# Patient Record
Sex: Female | Born: 1975 | Race: Black or African American | Hispanic: No | Marital: Single | Smoking: Current every day smoker
Health system: Southern US, Community
[De-identification: ages and names within clinical notes are randomized; demographics above are authoritative.]

## PROBLEM LIST (undated history)

## (undated) DIAGNOSIS — K219 Gastro-esophageal reflux disease without esophagitis: Secondary | ICD-10-CM

## (undated) DIAGNOSIS — N289 Disorder of kidney and ureter, unspecified: Secondary | ICD-10-CM

## (undated) DIAGNOSIS — D649 Anemia, unspecified: Secondary | ICD-10-CM

## (undated) DIAGNOSIS — F1021 Alcohol dependence, in remission: Secondary | ICD-10-CM

## (undated) DIAGNOSIS — IMO0002 Reserved for concepts with insufficient information to code with codable children: Secondary | ICD-10-CM

## (undated) HISTORY — PX: TUMOR REMOVAL: SHX12

## (undated) HISTORY — PX: ROUX-EN-Y PROCEDURE: SUR1287

## (undated) HISTORY — PX: WRIST GANGLION EXCISION: SUR520

## (undated) HISTORY — PX: FOOT SURGERY: SHX648

## (undated) HISTORY — PX: ABDOMINAL SURGERY: SHX537

## (undated) HISTORY — PX: HERNIA REPAIR: SHX51

## (undated) HISTORY — DX: Anemia, unspecified: D64.9

## (undated) HISTORY — PX: COSMETIC SURGERY: SHX468

---

## 1998-05-16 ENCOUNTER — Emergency Department (HOSPITAL_COMMUNITY): Admission: EM | Admit: 1998-05-16 | Discharge: 1998-05-16 | Payer: Self-pay | Admitting: Emergency Medicine

## 1998-08-19 ENCOUNTER — Emergency Department (HOSPITAL_COMMUNITY): Admission: EM | Admit: 1998-08-19 | Discharge: 1998-08-19 | Payer: Self-pay | Admitting: Emergency Medicine

## 1998-12-18 ENCOUNTER — Other Ambulatory Visit: Admission: RE | Admit: 1998-12-18 | Discharge: 1998-12-18 | Payer: Self-pay | Admitting: Specialist

## 1999-04-11 ENCOUNTER — Encounter: Payer: Self-pay | Admitting: Emergency Medicine

## 1999-04-11 ENCOUNTER — Emergency Department (HOSPITAL_COMMUNITY): Admission: EM | Admit: 1999-04-11 | Discharge: 1999-04-11 | Payer: Self-pay | Admitting: Emergency Medicine

## 1999-07-14 ENCOUNTER — Other Ambulatory Visit: Admission: RE | Admit: 1999-07-14 | Discharge: 1999-07-14 | Payer: Self-pay | Admitting: Obstetrics and Gynecology

## 1999-07-22 ENCOUNTER — Encounter: Payer: Self-pay | Admitting: Obstetrics and Gynecology

## 1999-07-22 ENCOUNTER — Ambulatory Visit (HOSPITAL_COMMUNITY): Admission: RE | Admit: 1999-07-22 | Discharge: 1999-07-22 | Payer: Self-pay | Admitting: Obstetrics and Gynecology

## 1999-08-05 ENCOUNTER — Encounter: Payer: Self-pay | Admitting: Emergency Medicine

## 1999-08-05 ENCOUNTER — Emergency Department (HOSPITAL_COMMUNITY): Admission: EM | Admit: 1999-08-05 | Discharge: 1999-08-05 | Payer: Self-pay | Admitting: Emergency Medicine

## 1999-08-10 ENCOUNTER — Encounter: Payer: Self-pay | Admitting: Internal Medicine

## 1999-08-10 ENCOUNTER — Encounter: Admission: RE | Admit: 1999-08-10 | Discharge: 1999-08-10 | Payer: Self-pay | Admitting: Internal Medicine

## 1999-08-11 ENCOUNTER — Encounter: Payer: Self-pay | Admitting: Internal Medicine

## 1999-08-17 ENCOUNTER — Ambulatory Visit (HOSPITAL_COMMUNITY): Admission: RE | Admit: 1999-08-17 | Discharge: 1999-08-17 | Payer: Self-pay | Admitting: Obstetrics and Gynecology

## 1999-08-17 ENCOUNTER — Encounter (INDEPENDENT_AMBULATORY_CARE_PROVIDER_SITE_OTHER): Payer: Self-pay

## 2000-01-13 ENCOUNTER — Emergency Department (HOSPITAL_COMMUNITY): Admission: EM | Admit: 2000-01-13 | Discharge: 2000-01-13 | Payer: Self-pay

## 2000-04-07 ENCOUNTER — Inpatient Hospital Stay (HOSPITAL_COMMUNITY): Admission: AD | Admit: 2000-04-07 | Discharge: 2000-04-07 | Payer: Self-pay | Admitting: Obstetrics and Gynecology

## 2000-04-20 ENCOUNTER — Ambulatory Visit (HOSPITAL_COMMUNITY): Admission: RE | Admit: 2000-04-20 | Discharge: 2000-04-20 | Payer: Self-pay | Admitting: Obstetrics and Gynecology

## 2000-04-20 ENCOUNTER — Encounter: Payer: Self-pay | Admitting: Obstetrics and Gynecology

## 2000-04-24 ENCOUNTER — Ambulatory Visit: Admission: RE | Admit: 2000-04-24 | Discharge: 2000-04-24 | Payer: Self-pay | Admitting: *Deleted

## 2000-04-25 ENCOUNTER — Encounter: Payer: Self-pay | Admitting: *Deleted

## 2000-04-25 ENCOUNTER — Ambulatory Visit (HOSPITAL_COMMUNITY): Admission: RE | Admit: 2000-04-25 | Discharge: 2000-04-25 | Payer: Self-pay | Admitting: *Deleted

## 2000-05-20 ENCOUNTER — Emergency Department (HOSPITAL_COMMUNITY): Admission: EM | Admit: 2000-05-20 | Discharge: 2000-05-20 | Payer: Self-pay | Admitting: Internal Medicine

## 2000-06-12 ENCOUNTER — Ambulatory Visit (HOSPITAL_COMMUNITY): Admission: RE | Admit: 2000-06-12 | Discharge: 2000-06-12 | Payer: Self-pay | Admitting: *Deleted

## 2000-06-12 ENCOUNTER — Encounter: Payer: Self-pay | Admitting: Obstetrics and Gynecology

## 2000-06-18 ENCOUNTER — Ambulatory Visit (HOSPITAL_COMMUNITY): Admission: RE | Admit: 2000-06-18 | Discharge: 2000-06-18 | Payer: Self-pay | Admitting: Obstetrics and Gynecology

## 2000-06-25 ENCOUNTER — Ambulatory Visit (HOSPITAL_COMMUNITY): Admission: RE | Admit: 2000-06-25 | Discharge: 2000-06-25 | Payer: Self-pay | Admitting: Obstetrics and Gynecology

## 2000-09-21 ENCOUNTER — Encounter (INDEPENDENT_AMBULATORY_CARE_PROVIDER_SITE_OTHER): Payer: Self-pay

## 2000-09-21 ENCOUNTER — Inpatient Hospital Stay (HOSPITAL_COMMUNITY): Admission: AD | Admit: 2000-09-21 | Discharge: 2000-09-27 | Payer: Self-pay | Admitting: Obstetrics and Gynecology

## 2000-11-08 ENCOUNTER — Other Ambulatory Visit: Admission: RE | Admit: 2000-11-08 | Discharge: 2000-11-08 | Payer: Self-pay | Admitting: Obstetrics and Gynecology

## 2001-01-15 ENCOUNTER — Emergency Department (HOSPITAL_COMMUNITY): Admission: EM | Admit: 2001-01-15 | Discharge: 2001-01-16 | Payer: Self-pay | Admitting: Emergency Medicine

## 2001-06-24 ENCOUNTER — Emergency Department (HOSPITAL_COMMUNITY): Admission: EM | Admit: 2001-06-24 | Discharge: 2001-06-25 | Payer: Self-pay | Admitting: Emergency Medicine

## 2002-06-14 ENCOUNTER — Emergency Department (HOSPITAL_COMMUNITY): Admission: EM | Admit: 2002-06-14 | Discharge: 2002-06-14 | Payer: Self-pay

## 2002-07-22 ENCOUNTER — Emergency Department (HOSPITAL_COMMUNITY): Admission: EM | Admit: 2002-07-22 | Discharge: 2002-07-22 | Payer: Self-pay | Admitting: Emergency Medicine

## 2002-10-01 ENCOUNTER — Encounter: Admission: RE | Admit: 2002-10-01 | Discharge: 2002-10-01 | Payer: Self-pay | Admitting: Internal Medicine

## 2002-10-01 ENCOUNTER — Encounter: Payer: Self-pay | Admitting: Internal Medicine

## 2004-01-02 ENCOUNTER — Emergency Department (HOSPITAL_COMMUNITY): Admission: EM | Admit: 2004-01-02 | Discharge: 2004-01-02 | Payer: Self-pay | Admitting: Emergency Medicine

## 2004-01-18 ENCOUNTER — Emergency Department (HOSPITAL_COMMUNITY): Admission: EM | Admit: 2004-01-18 | Discharge: 2004-01-18 | Payer: Self-pay | Admitting: Family Medicine

## 2004-01-24 ENCOUNTER — Emergency Department (HOSPITAL_COMMUNITY): Admission: EM | Admit: 2004-01-24 | Discharge: 2004-01-24 | Payer: Self-pay | Admitting: *Deleted

## 2004-01-26 ENCOUNTER — Encounter: Admission: RE | Admit: 2004-01-26 | Discharge: 2004-01-26 | Payer: Self-pay | Admitting: Internal Medicine

## 2004-02-17 ENCOUNTER — Encounter: Admission: RE | Admit: 2004-02-17 | Discharge: 2004-05-17 | Payer: Self-pay | Admitting: Sports Medicine

## 2004-04-18 ENCOUNTER — Ambulatory Visit (HOSPITAL_COMMUNITY): Admission: RE | Admit: 2004-04-18 | Discharge: 2004-04-18 | Payer: Self-pay | Admitting: Surgery

## 2004-04-18 ENCOUNTER — Encounter (INDEPENDENT_AMBULATORY_CARE_PROVIDER_SITE_OTHER): Payer: Self-pay | Admitting: Specialist

## 2004-12-03 ENCOUNTER — Ambulatory Visit (HOSPITAL_COMMUNITY): Admission: RE | Admit: 2004-12-03 | Discharge: 2004-12-03 | Payer: Self-pay | Admitting: General Surgery

## 2004-12-03 ENCOUNTER — Encounter (INDEPENDENT_AMBULATORY_CARE_PROVIDER_SITE_OTHER): Payer: Self-pay | Admitting: Specialist

## 2005-08-30 ENCOUNTER — Encounter: Admission: RE | Admit: 2005-08-30 | Discharge: 2005-08-30 | Payer: Self-pay | Admitting: Internal Medicine

## 2005-09-15 ENCOUNTER — Ambulatory Visit (HOSPITAL_COMMUNITY): Admission: AD | Admit: 2005-09-15 | Discharge: 2005-09-15 | Payer: Self-pay

## 2005-10-12 ENCOUNTER — Encounter (INDEPENDENT_AMBULATORY_CARE_PROVIDER_SITE_OTHER): Payer: Self-pay | Admitting: *Deleted

## 2005-10-12 ENCOUNTER — Ambulatory Visit (HOSPITAL_COMMUNITY): Admission: RE | Admit: 2005-10-12 | Discharge: 2005-10-12 | Payer: Self-pay | Admitting: General Surgery

## 2005-11-23 ENCOUNTER — Encounter (INDEPENDENT_AMBULATORY_CARE_PROVIDER_SITE_OTHER): Payer: Self-pay | Admitting: Specialist

## 2005-11-23 ENCOUNTER — Ambulatory Visit (HOSPITAL_COMMUNITY): Admission: RE | Admit: 2005-11-23 | Discharge: 2005-11-23 | Payer: Self-pay | Admitting: General Surgery

## 2009-10-06 ENCOUNTER — Emergency Department (HOSPITAL_COMMUNITY): Admission: EM | Admit: 2009-10-06 | Discharge: 2009-10-07 | Payer: Self-pay | Admitting: Emergency Medicine

## 2009-11-23 ENCOUNTER — Emergency Department (HOSPITAL_BASED_OUTPATIENT_CLINIC_OR_DEPARTMENT_OTHER): Admission: EM | Admit: 2009-11-23 | Discharge: 2009-11-23 | Payer: Self-pay | Admitting: Emergency Medicine

## 2010-04-03 ENCOUNTER — Emergency Department (HOSPITAL_COMMUNITY): Admission: EM | Admit: 2010-04-03 | Discharge: 2010-04-03 | Payer: Self-pay | Admitting: Emergency Medicine

## 2010-04-07 ENCOUNTER — Ambulatory Visit: Payer: Self-pay | Admitting: Internal Medicine

## 2010-09-03 ENCOUNTER — Emergency Department (HOSPITAL_BASED_OUTPATIENT_CLINIC_OR_DEPARTMENT_OTHER)
Admission: EM | Admit: 2010-09-03 | Discharge: 2010-09-03 | Payer: Self-pay | Source: Home / Self Care | Admitting: Emergency Medicine

## 2010-09-04 ENCOUNTER — Emergency Department (HOSPITAL_COMMUNITY)
Admission: EM | Admit: 2010-09-04 | Discharge: 2010-09-05 | Payer: Self-pay | Source: Home / Self Care | Admitting: Emergency Medicine

## 2010-09-13 ENCOUNTER — Emergency Department (HOSPITAL_BASED_OUTPATIENT_CLINIC_OR_DEPARTMENT_OTHER)
Admission: EM | Admit: 2010-09-13 | Discharge: 2010-09-13 | Payer: Self-pay | Source: Home / Self Care | Admitting: Emergency Medicine

## 2010-10-18 ENCOUNTER — Ambulatory Visit
Admission: RE | Admit: 2010-10-18 | Discharge: 2010-10-18 | Payer: Self-pay | Source: Home / Self Care | Attending: Internal Medicine | Admitting: Internal Medicine

## 2010-10-18 ENCOUNTER — Encounter
Admission: RE | Admit: 2010-10-18 | Discharge: 2010-10-18 | Payer: Self-pay | Source: Home / Self Care | Attending: Internal Medicine | Admitting: Internal Medicine

## 2010-10-18 ENCOUNTER — Inpatient Hospital Stay (HOSPITAL_COMMUNITY)
Admission: EM | Admit: 2010-10-18 | Discharge: 2010-10-20 | Payer: Self-pay | Attending: Internal Medicine | Admitting: Internal Medicine

## 2010-10-19 LAB — DIFFERENTIAL
Basophils Absolute: 0 10*3/uL (ref 0.0–0.1)
Basophils Relative: 0 % (ref 0–1)
Eosinophils Absolute: 0.1 10*3/uL (ref 0.0–0.7)
Eosinophils Relative: 2 % (ref 0–5)
Monocytes Absolute: 0.5 10*3/uL (ref 0.1–1.0)
Monocytes Relative: 10 % (ref 3–12)
Neutrophils Relative %: 60 % (ref 43–77)

## 2010-10-19 LAB — BASIC METABOLIC PANEL
BUN: 9 mg/dL (ref 6–23)
CO2: 26 mEq/L (ref 19–32)
Creatinine, Ser: 0.59 mg/dL (ref 0.4–1.2)
Glucose, Bld: 82 mg/dL (ref 70–99)
Potassium: 3.1 mEq/L — ABNORMAL LOW (ref 3.5–5.1)
Sodium: 142 mEq/L (ref 135–145)

## 2010-10-19 LAB — LIPASE, BLOOD: Lipase: 20 U/L (ref 11–59)

## 2010-10-19 LAB — CBC
RBC: 3.2 MIL/uL — ABNORMAL LOW (ref 3.87–5.11)
WBC: 4.9 10*3/uL (ref 4.0–10.5)

## 2010-10-19 LAB — HEPATIC FUNCTION PANEL
ALT: 11 U/L (ref 0–35)
AST: 12 U/L (ref 0–37)
Alkaline Phosphatase: 63 U/L (ref 39–117)
Bilirubin, Direct: 0.2 mg/dL (ref 0.0–0.3)
Total Bilirubin: 1 mg/dL (ref 0.3–1.2)
Total Protein: 5.7 g/dL — ABNORMAL LOW (ref 6.0–8.3)

## 2010-10-20 ENCOUNTER — Ambulatory Visit
Admission: RE | Admit: 2010-10-20 | Discharge: 2010-10-20 | Payer: Self-pay | Source: Home / Self Care | Attending: Internal Medicine | Admitting: Internal Medicine

## 2010-10-20 LAB — URINALYSIS, ROUTINE W REFLEX MICROSCOPIC
Bilirubin Urine: NEGATIVE
Ketones, ur: NEGATIVE mg/dL
Protein, ur: NEGATIVE mg/dL
Specific Gravity, Urine: 1.012 (ref 1.005–1.030)
Urobilinogen, UA: 1 mg/dL (ref 0.0–1.0)

## 2010-10-20 LAB — CBC
Hemoglobin: 9.6 g/dL — ABNORMAL LOW (ref 12.0–15.0)
MCH: 31.1 pg (ref 26.0–34.0)
MCV: 90 fL (ref 78.0–100.0)
Platelets: 179 10*3/uL (ref 150–400)
WBC: 4.7 10*3/uL (ref 4.0–10.5)

## 2010-10-20 LAB — DIFFERENTIAL
Basophils Absolute: 0 10*3/uL (ref 0.0–0.1)
Basophils Relative: 0 % (ref 0–1)
Eosinophils Absolute: 0.2 10*3/uL (ref 0.0–0.7)
Eosinophils Relative: 3 % (ref 0–5)
Lymphs Abs: 1.6 10*3/uL (ref 0.7–4.0)
Neutrophils Relative %: 52 % (ref 43–77)

## 2010-10-20 LAB — BASIC METABOLIC PANEL
Calcium: 8.3 mg/dL — ABNORMAL LOW (ref 8.4–10.5)
Chloride: 108 mEq/L (ref 96–112)
Creatinine, Ser: 0.56 mg/dL (ref 0.4–1.2)
GFR calc Af Amer: >60 mL/min (ref >60)
Glucose, Bld: 90 mg/dL (ref 70–99)
Potassium: 2.9 mEq/L — ABNORMAL LOW (ref 3.5–5.1)

## 2010-10-21 LAB — URINE CULTURE
Culture  Setup Time: 201201260531
Culture: NO GROWTH
Special Requests: NEGATIVE

## 2010-10-24 ENCOUNTER — Encounter: Payer: Self-pay | Admitting: Gastroenterology

## 2010-10-26 NOTE — Discharge Summary (Signed)
NAMEBRECKEN, [name]              ACCOUNT NO.:  [id number]  MEDICAL RECORD NO.:  [id number]          PATIENT TYPE:  INP  LOCATION:  1522                         FACILITY:  [facility]  PHYSICIAN:  Luanna Cole. Lenord Fellers, M.D.   DATE OF BIRTH:  23-Jan-1976  DATE OF ADMISSION:  10/18/2010 DATE OF DISCHARGE:  10/20/2010                              DISCHARGE SUMMARY   FINAL DIAGNOSES: 1. 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 2. Ulcerations detected on jejunal side of the pouch by endoscopy at     [facility] on September 16, 2010. 3. Gastroesophageal reflux. 4. History of smoking. 5. Status post gastric bypass surgery in December 2010 [facility]     [facility]. 6. Hypokalemia.  CONDITION AT TIME OF DISCHARGE:  Stable.  The patient is being transferred to [facility] for further evaluation and possible surgery.  DISCHARGE MEDICATIONS: 1. Flagyl 500 mg IV every 8 hours. 2. Cipro 400 mg IV every 12 hours. 3. Protonix 40 mg IV every 12 hours. 4. Morphine sulfate 1 mg IV every 4 hours p.r.n. pain. 5. Carafate slurry 1 gram p.o. t.i.d. 6. Vitamin B12 daily.  The patient is noted at the time of discharge to have potassium of 2.9, on IV fluids D5 1/2 normal saline with 20 mEq of potassium chloride per liter at 125 mL per hour.  She is on a clear liquid diet.  IV fluids are being changed prior to transfer to D5 1/2 normal saline with 40 mEq KCl per liter at 125 mL per hour.  She is likely to need additional potassium supplementation upon arrival to [facility] via runs of potassium chloride to normalize her potassium.  BRIEF HISTORY:  This 35 year old black female underwent gastric bypass surgery at [facility] in December 2010 and has done well, losing about 163 pounds over the past year.  Her weight in 2006 was 333  pounds in my office.  She presented to the office acutely complaining of severe abdominal pain and had onset on Sunday, January 22 after eating a small amount of beef hot dog.  She has had abdominal pain throughout December and had endoscopy by Dr. Barnetta Chapel on September 17, 2011, at [facility] showing some alterations on the jejunal side of the pouch and was started on Carafate t.i.d. and Prilosec.  The patient said she did not get relief with these medications but was not taking Carafate on an empty stomach always.  She does smoke mostly on weekends about half a pack over 2 days.  She had tried Tums p.o. and Gas-X for pain relief, but of course these did not work.  Spoke with Dr. Barnetta Chapel by telephone.  He suggested we try IV fluid hydration as the patient was felt to be volume depleted with some small amount of vomiting and they are to before admission.  Her urine was dark.  Specific gravity in office was 1.015.  Specific gravity at time of discharge 1.012, negative for blood, LE,  and nitrite.  No bilirubin noted in urine.  Liver functions are normal.  White blood cell count at the time of discharge is 40,700 and on admission was 55,100.  The patient had maximal temperature spike of 100.1 degrees at 2:00 a.m. on January 25 and now temperature is 99 degrees orally.  Blood pressure is stable at 116/71, pulse 70 and regular.  HOSPITAL COURSE:  The patient was given morphine sulfate 1 mg IV every 4 hours p.r.n. abdominal pain and did well with that.  She was started on Carafate slurry 1 gram t.i.d., was given Protonix 40 mg IV daily initially but that was increased to every 12 hours after 24 hours.  She had a CT scan of her abdomen and a GI consultation by Dr. Wendall Papa. CT scan showed no tissue density in the left abdomen, possibly an area of fat necrosis.  There was some peritoneal fluid present on CT of the abdomen and some periportal edema.  Dr. Christella Hartigan showed this CT  scan to a bariatric surgeon and he felt it was possibly consistent with an internal hernia, a rare complication related to gastric bypass surgery. Dr. Christella Hartigan called bariatric surgeon at [facility] and he agreed to take the patient in transfer for evaluation and possible surgery.  The patient is being discharged in stable condition and stable for transport.     Luanna Cole. Lenord Fellers, M.D.     MJB/MEDQ  D:  10/20/2010  T:  10/20/2010  Job:  [id number]  cc:   Rachael Fee, MD [address] [place], [place] [address]  Electronically Signed by Marlan Palau M.D. on 10/26/2010 05:34:21 PM

## 2010-10-26 NOTE — H&P (Signed)
[name], [name]              ACCOUNT NO.:  [id number]  MEDICAL RECORD NO.:  [id number]          PATIENT TYPE:  INP  LOCATION:  1522                         FACILITY:  [facility]  PHYSICIAN:  [name]. Lori Fellers, M.D.   DATE OF BIRTH:  1976/01/16  DATE OF ADMISSION:  10/18/2010 DATE OF DISCHARGE:                             HISTORY & PHYSICAL   HISTORY OF PRESENT ILLNESS:  This is a 35 year old black female status post gastric bypass surgery at [name] in December 2010, has done very well up until December 2011.  She developed abdominal pain and said she went to the emergency department here on December 10th, 12th and 20th.  She has had abdominal ultrasound, KUB and abdominal CT.  Abdominal CT suggested sludge in the gallbladder with abdominal ultrasound was negative.  She subsequently went to see [name], surgeon at [facility] who performed her surgery.  He did an endoscopy on September 17, 2011 showing multiple ulcerations on the jejunal side of the pouch, which were treated with Carafate t.i.d. and Prilosec.  The patient says she has got no relief from these upon further questioning.  She is smoking about half a pack over 2 days on the weekends.  She ate part of a beef hot dog on Saturday, January 21 and became ill on Sunday, January 22.  Says she has been watching her weight fairly carefully and has lost 163 pounds over the past year.  She says she did eat some jalapeno peppers at one point in December and that caused one of her ER visits.  She says that over the past 2 days, she has had chills, no documented fever, complained of nausea.  She tried Tums and Gas-X without relief.  Says she has had a small amount of vomiting.  Decreased oral intake.  I was unaware if she had all of these problems until she came to the office today.  White blood cell count 5500, hemoglobin 11.7 g, sodium 146.  Liver functions within normal limits.   Urinalysis showed urobilinogen, specific gravity 1.015.  Spoke with [name] and he indicated 15% of gastric bypass patients would develop these types of ulcerations and pain and it would take a little bit of time to work through it.  He indicated sometimes patients had to have dilatation if a stricture developed but he was certain she had no stricture when he endoscoped her on December 23.  He also indicated that sometimes these patients had to undergo a vagotomy. She is admitted now for IV fluid hydration, IV PPI medication, bowel rest and p.o. Carafate.  Of note, she has been taking the Carafate, not necessarily on an empty stomach and also with some medication.  I doubt it was really working for her under those circumstances.  ALLERGIES:  No known drug allergies.  PAST MEDICAL HISTORY: 1. Longstanding history of morbid obesity prior to gastric bypass     surgery in December 2010. 2. History of GE reflux. 3. She had a D and C in November 2000. 4. The gastric bypass surgery that she underwent was described as  a     Roux-en-Y laparoscopic endoscopy. 5. She has had multiple abscesses drained from axillae in the past.     In July 2005, Dr. Ezzard Chambers drained an abscess from her left axilla     with a residual cyst.  Apparently prior to that operation, she had     been in the office requiring drainage at least twice.  In March     2006, Dr. Maryagnes Chambers did an I and D for right axillary abscess and     excision of right axillary and tail of breast inflammatory tissue.     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  This has not been a recurrent problem.  In December     2006, Dr. Orson Chambers did an I and D abscess drainage of the right     axilla.  SOCIAL HISTORY:  The patient has 1 child.  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 She also is attending college part time.  Her weight was noted to be 333 pounds in 2006.  I saw her in July 2011 and she weighed 233 pounds.  Today, she weighs 182 pounds.  She does smoke some, alcohol consumption is rare, never married.  Her baby's father is supportive.  History of mild vitamin D deficiency in July 2011.  FAMILY HISTORY:  Mother with history of hyperlipidemia and hypertension. Father deceased with history of diabetes and CVA.  She has several siblings.  Some are overweight.  One sister died of pancreatic tumor or some type of GI malignancy, it is not clear to me.  Mother was recently diagnosed with a pulmonary embolus.  One brother in good health.  OBJECTIVE:  SKIN:  Warm and dry.  Nodes none. HEENT EXAM:  TMs and pharynx are clear.  Sclerae are clear. NECK:  Supple.  No JVD, thyromegaly or carotid bruits. CHEST:  Clear. CARDIAC EXAM:  Regular rate and rhythm. ABDOMEN:  Bowel sounds decreased.  No distention.  No hepatosplenomegaly or masses but exquisitely tender in the epigastric area and generally in the abdomen plus/minus rebound tenderness present.  She was sent to [facility] imaging for KUB, flat and upright, which revealed minimal ileus of proximal bowel.  Spoke with [name] by phone regarding her situation, he agreed.  We will admit her for IV fluid hydration, pain management and aggressive treatment of ulcerations that were detected at the end of December.  GI consult will be obtained.  IMPRESSION: 1. Ileus, proximal small bowel. 2. Status post gastric bypass surgery, December 2010 with good weight     loss, now with significant abdominal pain, etiology unclear despite     several imaging studies and trips to the emergency department since     early December 2011.  Recent diagnosis of ulcerations on jejunal     side of pouch. 3. Cigarette abuse. 4. Remote history of GE reflux.  PLAN:  The patient will be admitted for aggressive IV  fluid hydration. We will give small amounts of morphine sulfate IV.  Start IV proton pump inhibitor.  GI consultations.     [name]. Lori Fellers, M.D.     MJB/MEDQ  D:  10/19/2010  T:  10/19/2010  Job:  [id number]  cc:   Rachael Fee, MD [address]  [place], [place] [address]  Electronically Signed by Marlan Palau M.D. on 10/26/2010 05:34:16 PM

## 2010-11-08 ENCOUNTER — Ambulatory Visit: Payer: Self-pay | Admitting: Internal Medicine

## 2010-11-08 DIAGNOSIS — K279 Peptic ulcer, site unspecified, unspecified as acute or chronic, without hemorrhage or perforation: Secondary | ICD-10-CM

## 2010-11-10 NOTE — Letter (Signed)
Summary: [facility]   Imported By: Sherian Rein 11/04/2010 12:30:40  _____________________________________________________________________  External Attachment:    Type:   Image     Comment:   External Document

## 2010-11-11 ENCOUNTER — Other Ambulatory Visit: Payer: Self-pay | Admitting: Internal Medicine

## 2010-12-05 LAB — CBC
HCT: 37.6 % (ref 36.0–46.0)
Hemoglobin: 13.1 g/dL (ref 12.0–15.0)
MCH: 30.3 pg (ref 26.0–34.0)
MCH: 31.7 pg (ref 26.0–34.0)
MCHC: 34.2 g/dL (ref 30.0–36.0)
Platelets: 294 10*3/uL (ref 150–400)
RBC: 3.63 MIL/uL — ABNORMAL LOW (ref 3.87–5.11)
RDW: 14.1 % (ref 11.5–15.5)
WBC: 6.1 10*3/uL (ref 4.0–10.5)

## 2010-12-05 LAB — URINALYSIS, ROUTINE W REFLEX MICROSCOPIC
Glucose, UA: NEGATIVE mg/dL
Leukocytes, UA: NEGATIVE
Specific Gravity, Urine: 1.018 (ref 1.005–1.030)
pH: 5.5 (ref 5.0–8.0)

## 2010-12-05 LAB — COMPREHENSIVE METABOLIC PANEL
ALT: 29 U/L (ref 0–35)
ALT: 20 U/L (ref 0–35)
AST: 16 U/L (ref 0–37)
Albumin: 4.7 g/dL (ref 3.5–5.2)
GFR calc Af Amer: >60 mL/min (ref >60)
GFR calc Af Amer: >60 mL/min (ref >60)
GFR calc non Af Amer: >60 mL/min (ref >60)
Glucose, Bld: 86 mg/dL (ref 70–99)
Glucose, Bld: 83 mg/dL (ref 70–99)
Potassium: 3.3 mEq/L — ABNORMAL LOW (ref 3.5–5.1)
Sodium: 136 mEq/L (ref 135–145)
Total Bilirubin: 0.6 mg/dL (ref 0.3–1.2)
Total Bilirubin: 0.9 mg/dL (ref 0.3–1.2)
Total Protein: 8.4 g/dL — ABNORMAL HIGH (ref 6.0–8.3)
Total Protein: 6.5 g/dL (ref 6.0–8.3)

## 2010-12-05 LAB — DIFFERENTIAL
Basophils Absolute: 0 10*3/uL (ref 0.0–0.1)
Eosinophils Absolute: 0.2 10*3/uL (ref 0.0–0.7)
Eosinophils Relative: 3 % (ref 0–5)
Eosinophils Relative: 2 % (ref 0–5)
Lymphocytes Relative: 53 % — ABNORMAL HIGH (ref 12–46)
Lymphs Abs: 3.2 10*3/uL (ref 0.7–4.0)
Monocytes Absolute: 0.5 10*3/uL (ref 0.1–1.0)
Monocytes Relative: 6 % (ref 3–12)
Neutro Abs: 2.3 10*3/uL (ref 1.7–7.7)
Neutrophils Relative %: 38 % — ABNORMAL LOW (ref 43–77)

## 2010-12-05 LAB — PREGNANCY, URINE: Preg Test, Ur: NEGATIVE

## 2010-12-05 LAB — LIPASE, BLOOD
Lipase: 232 U/L (ref 23–300)
Lipase: 27 U/L (ref 11–59)

## 2010-12-05 LAB — POCT CARDIAC MARKERS

## 2010-12-05 LAB — URINE MICROSCOPIC-ADD ON

## 2010-12-06 LAB — COMPREHENSIVE METABOLIC PANEL
ALT: 33 U/L (ref 0–35)
BUN: 14 mg/dL (ref 6–23)
CO2: 22 mEq/L (ref 19–32)
Calcium: 9.5 mg/dL (ref 8.4–10.5)
Creatinine, Ser: 0.6 mg/dL (ref 0.4–1.2)
GFR calc non Af Amer: >60 mL/min (ref >60)
Glucose, Bld: 83 mg/dL (ref 70–99)
Sodium: 145 mEq/L (ref 135–145)
Total Protein: 7.8 g/dL (ref 6.0–8.3)

## 2010-12-06 LAB — URINALYSIS, ROUTINE W REFLEX MICROSCOPIC
Bilirubin Urine: NEGATIVE
Ketones, ur: 15 mg/dL — AB
Nitrite: NEGATIVE
Specific Gravity, Urine: 1.024 (ref 1.005–1.030)
Urobilinogen, UA: 1 mg/dL (ref 0.0–1.0)

## 2010-12-06 LAB — DIFFERENTIAL
Eosinophils Absolute: 0.1 10*3/uL (ref 0.0–0.7)
Lymphs Abs: 3.1 10*3/uL (ref 0.7–4.0)
Neutro Abs: 3.1 10*3/uL (ref 1.7–7.7)
Neutrophils Relative %: 45 % (ref 43–77)

## 2010-12-06 LAB — CBC
HCT: 35.9 % — ABNORMAL LOW (ref 36.0–46.0)
Hemoglobin: 12.7 g/dL (ref 12.0–15.0)
MCH: 30.3 pg (ref 26.0–34.0)
MCHC: 35.4 g/dL (ref 30.0–36.0)
MCV: 85.7 fL (ref 78.0–100.0)
RDW: 14.3 % (ref 11.5–15.5)

## 2010-12-06 LAB — LIPASE, BLOOD: Lipase: 113 U/L (ref 23–300)

## 2010-12-06 LAB — PREGNANCY, URINE: Preg Test, Ur: NEGATIVE

## 2010-12-10 LAB — POCT I-STAT, CHEM 8
BUN: 12 mg/dL (ref 6–23)
Calcium, Ion: 1.16 mmol/L (ref 1.12–1.32)
Chloride: 106 mEq/L (ref 96–112)
Creatinine, Ser: 0.9 mg/dL (ref 0.4–1.2)
Glucose, Bld: 88 mg/dL (ref 70–99)
TCO2: 28 mmol/L (ref 0–100)

## 2010-12-10 LAB — CBC
MCHC: 33.7 g/dL (ref 30.0–36.0)
MCV: 90.8 fL (ref 78.0–100.0)
RDW: 13.9 % (ref 11.5–15.5)
WBC: 7.3 10*3/uL (ref 4.0–10.5)

## 2010-12-10 LAB — DIFFERENTIAL
Basophils Absolute: 0 10*3/uL (ref 0.0–0.1)
Eosinophils Relative: 2 % (ref 0–5)
Lymphocytes Relative: 28 % (ref 12–46)
Lymphs Abs: 2.1 10*3/uL (ref 0.7–4.0)
Monocytes Absolute: 0.6 10*3/uL (ref 0.1–1.0)
Monocytes Relative: 8 % (ref 3–12)
Neutro Abs: 4.5 10*3/uL (ref 1.7–7.7)

## 2010-12-12 ENCOUNTER — Ambulatory Visit: Payer: Managed Care, Other (non HMO) | Admitting: Internal Medicine

## 2010-12-13 ENCOUNTER — Ambulatory Visit (INDEPENDENT_AMBULATORY_CARE_PROVIDER_SITE_OTHER): Payer: Managed Care, Other (non HMO) | Admitting: Internal Medicine

## 2010-12-13 DIAGNOSIS — F17200 Nicotine dependence, unspecified, uncomplicated: Secondary | ICD-10-CM

## 2011-02-10 ENCOUNTER — Emergency Department (HOSPITAL_COMMUNITY)
Admission: EM | Admit: 2011-02-10 | Discharge: 2011-02-10 | Disposition: A | Discharge status: Home / Self Care | Payer: Managed Care, Other (non HMO) | Attending: Emergency Medicine | Admitting: Emergency Medicine

## 2011-02-10 ENCOUNTER — Emergency Department (HOSPITAL_COMMUNITY): Payer: Managed Care, Other (non HMO)

## 2011-02-10 DIAGNOSIS — R109 Unspecified abdominal pain: Secondary | ICD-10-CM | POA: Insufficient documentation

## 2011-02-10 DIAGNOSIS — Z8711 Personal history of peptic ulcer disease: Secondary | ICD-10-CM | POA: Insufficient documentation

## 2011-02-10 DIAGNOSIS — R10816 Epigastric abdominal tenderness: Secondary | ICD-10-CM | POA: Insufficient documentation

## 2011-02-10 DIAGNOSIS — R110 Nausea: Secondary | ICD-10-CM | POA: Insufficient documentation

## 2011-02-10 LAB — COMPREHENSIVE METABOLIC PANEL
AST: 22 U/L (ref 0–37)
Albumin: 4 g/dL (ref 3.5–5.2)
BUN: 12 mg/dL (ref 6–23)
CO2: 26 mEq/L (ref 19–32)
Calcium: 9.3 mg/dL (ref 8.4–10.5)
Chloride: 103 mEq/L (ref 96–112)
Creatinine, Ser: 0.56 mg/dL (ref 0.4–1.2)
GFR calc Af Amer: >60 mL/min (ref >60)
GFR calc non Af Amer: >60 mL/min (ref >60)
Total Bilirubin: 0.2 mg/dL — ABNORMAL LOW (ref 0.3–1.2)

## 2011-02-10 LAB — DIFFERENTIAL
Basophils Relative: 1 % (ref 0–1)
Eosinophils Absolute: 0.2 10*3/uL (ref 0.0–0.7)
Lymphs Abs: 3.5 10*3/uL (ref 0.7–4.0)
Monocytes Absolute: 0.4 10*3/uL (ref 0.1–1.0)
Monocytes Relative: 7 % (ref 3–12)

## 2011-02-10 LAB — CBC
MCH: 29.9 pg (ref 26.0–34.0)
MCHC: 33.3 g/dL (ref 30.0–36.0)
MCV: 89.6 fL (ref 78.0–100.0)
Platelets: 259 10*3/uL (ref 150–400)
RBC: 4.12 MIL/uL (ref 3.87–5.11)

## 2011-02-10 NOTE — Op Note (Signed)
NAMEJOHNI, NARINE NO.:  [id number]   MEDICAL RECORD NO.:  [id number]          PATIENT TYPE:  OIB   LOCATION:  2899                         FACILITY:  [facility]   PHYSICIAN:  Gita Kudo, M.D. DATE OF BIRTH:  1976/02/03   DATE OF PROCEDURE:  12/03/2004  DATE OF DISCHARGE:                                 OPERATIVE REPORT   OPERATIVE PROCEDURE:  Incision and drainage, right axillary abscess;  excision of right axillary and tail of breast inflammatory tissue.   SURGEON:  Gita Kudo, M.D.   ANESTHESIA:  General.   PREOPERATIVE DIAGNOSIS:  Right axillary abscess, hidradenitis suppurativa.   POSTOPERATIVE DIAGNOSIS:  Right axillary abscess, hidradenitis suppurativa,  with large inflammatory mass in the tail of the breast.   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  She has had a previous left  axillary operation for hidradenitis.  On physical examination, there was a  tender mass about 3-4 cm in size.   OPERATIVE FINDINGS:  There was a fair amount of non-odorous creamy pus,  about 20 mL.  After the pus was drained, there was still this large mass out  in the axillary portion of the breast, lower portion of the axilla.   OPERATIVE PROCEDURE:  Under satisfactory general anesthesia, having been  given IV Ancef, the patient was positioned, prepped and draped in a standard  fashion.  An incision made over the most fluctuant area and white-yellow  creamy pus recovered and cultured.  The incision was extended and the cavity  was cleaned out by finger dissection, breaking down loculations.  However,  the mass that was palpable was still present.  Accordingly, using both  cautery and sharp dissection, I removed the mass which felt like  inflammatory tissue, but there was some necrosis in it and I felt that it  would be best to debride this away.  I did this all around to  healthy-  appearing fat.  No major vessels or nerves were encountered.  Bleeding was  controlled by cautery and the wound lavaged with saline.  It was packed with  1 large lap sponge with the blue tape cut off.   Dressings applied.  I had injected 30 mL  of 0.5% Marcaine into the tissue  for postop analgesia.  Sterile dressing then applied.  No complications and  the count was correct.      MRL/MEDQ  D:  12/03/2004  T:  12/05/2004  Job:  [id number]

## 2011-02-10 NOTE — Op Note (Signed)
[name], [name]              ACCOUNT NO.:  [id number]   MEDICAL RECORD NO.:  [id number]          PATIENT TYPE:  AMB   LOCATION:  DAY                          FACILITY:  [facility]   PHYSICIAN:  Lebron Conners, M.D.   DATE OF BIRTH:  [birth year]   DATE OF PROCEDURE:  DATE OF DISCHARGE:                                 OPERATIVE REPORT   Audio too short to transcribe (less than 5 seconds)      Lebron Conners, M.D.     WB/MEDQ  D:  09/15/2005  T:  09/15/2005  Job:  [id number]

## 2011-02-10 NOTE — Op Note (Signed)
[name], [name]              ACCOUNT NO.:  [id number]   MEDICAL RECORD NO.:  [id number]          PATIENT TYPE:  AMB   LOCATION:  DAY                          FACILITY:  [facility]   PHYSICIAN:  Lebron Conners, M.D.   DATE OF BIRTH:  Apr 14, 1976   DATE OF PROCEDURE:  09/15/2005  DATE OF DISCHARGE:                                 OPERATIVE REPORT   PREOPERATIVE DIAGNOSIS:  Abscess in the right axilla.   POSTOPERATIVE DIAGNOSIS:  Abscess in the right axilla.   OPERATION:  Incision and drainage of abscess of the right axilla.   SURGEON:  Lebron Conners, M.D.   ANESTHESIA:  Local with monitored anesthesia care and sedation.   DESCRIPTION OF PROCEDURE:  After the patient was monitored and sedated and  had routine preparation and draping of the right axilla, I liberally infused  long-acting local anesthetic over the fluctuant large abscess which was  present in the skin and subcutaneous tissues. I made an incision  transversely over it approximately 3 cm in length and drained a very large  amount of pus and took a culture. I then thoroughly irrigated the area out  and got good hemostasis. Hemostasis was not a problem. The abscess did not  appear to be due to any suppurative lymphadenopathy but rather was a process  which began with the skin. After packing the abscess, I put on a somewhat  compressive bandage. She tolerated the operation well and went to PACU in  good condition.      Lebron Conners, M.D.  Electronically Signed     WB/MEDQ  D:  09/15/2005  T:  09/19/2005  Job:  [id number]

## 2011-02-10 NOTE — Discharge Summary (Signed)
[facility]  Patient:    [name], [name]                     MRN: [id number] Adm. Date:  [id number] Disc. Date: 09/27/00 Attending:  Michaele Offer                           Discharge Summary  DISCHARGE DIAGNOSES:          1. Status post pregnancy at 40 weeks, delivered.                               2. Group B strep carrier.                               3. Chorioamnionitis.                               4. Arrested dilation.                               5. Status post primary low transverse cesarean                                  section.  DISCHARGE MEDICATIONS:        1. Motrin 600 mg p.o. every 6 hours p.r.n.                               2. Percocet 1-2 tablets p.o. every 4 hours                                  p.r.n.  DISCHARGE FOLLOW-UP:          The patient is to follow up in the office in approximately two weeks for her incision check.  HOSPITAL COURSE:              The patient is a 35 year old G1, P0, who is admitted at 40-4/7 weeks for induction given postdate.  Pregnancy had been complicated by cervical shortening at 26 weeks which was managed conservatively with bed rest and pelvic rest until 35 weeks.  The patient had no further cervical change at that point.  The patient is also group B strep positive.  PRENATAL LABORATORY:          B+, antibody negative, RPR nonreactive, rubella negative, hepatitis B surface antigen negative, HIV negative, GC negative, Chlamydia negative, GBS positive.  PAST OBSTETRICAL HISTORY:     None.  PAST GYNECOLOGIC HISTORY:     The patient had a D&C and hysteroscopy in November 2000.  PAST SURGICAL HISTORY:        D&C as above with hysteroscopy and a wrist surgery in March 2000.  PAST MEDICAL HISTORY:         None.  SOCIAL HISTORY:               The patient was a cigarette smoker; however, decreased to two cigarettes a day or less with her pregnancy.  ALLERGIES:  No  known drug allergies.  PHYSICAL EXAMINATION:         On admission, she was afebrile with stable vital signs.  Fetal heart rate was reactive without significant contractions. Cervix was 1 cm, 90% effaced, and a -1 station.  On an office exam, September 19, 2000, per Dr. Jackelyn Knife, she was begun on Pitocin and plan was to rupture membranes when slightly more favorable.  Later in the morning, attempt at AROM was made; however, no significant fluid obtained.  She was began on penicillin for her GBS positive status.  She progressed to approximately 5 cm dilation and then spiked a temperature to 101.8.  She was begun on Unasyn for chorioamnionitis and continued on 32 mU of Pitocin.  Despite maximal Pitocin, the patient had no cervical change for greater than two hours and continued to have a temperature.  Fetal heart rate was reassuring; however, given the arrest of dilation, the patient was counseled for cesarean section.  She underwent a primary low transverse cesarean section with a vigorous female infant.  Apgars were 9 and 9.  Weight was 7 pounds 15 ounces delivered.  She was then admitted for routine postoperative care and continued on her IV Unasyn.  HOSPITAL COURSE:              On postoperative day #1, her T-max was 101.2. Hemoglobin went from 9 to 7.4 postoperatively.  She was continued on her IV Unasyn for approximately 48 hours, and then this was discontinued.  On postoperative day #3, early in the morning, the patient again spiked a temperature to 101.6.  She had some chills; however, no other significant complaints.  She was not significantly engorged and had blood cultures and urine culture done at this time.  A repeat CBC demonstrated a white blood cell count of 11.2 which was down from 15.4.  At this point, the patients IV was restarted, and she was placed on clindamycin and gentamicin.  She was continued on the clindamycin and gentamicin for approximately three days.  On postop  day #5, she had been afebrile for approximately 48 hours.  Abdomen was benign.  Incision was well healing.  She was passing good flatus and tolerating a regular diet.  Therefore, she was given her last dose of IV antibiotics at approximately 12 noon and was then discharged to home in the evening, as she had been afebrile throughout the day. DD:  09/27/00 TD:  09/27/00 Job: [id number]

## 2011-02-10 NOTE — Op Note (Signed)
[facility]  Patient:    [name], [name]                     MRN: [id number] Proc. Date: 09/22/00 Adm. Date:  [id number] Attending:  Michaele Offer                           Operative Report  PREOPERATIVE DIAGNOSES:       1. Intrauterine pregnancy at 40 weeks.                               2. Chorioamnionitis.                               3. Arrested dilation.  POSTOPERATIVE DIAGNOSES:      1. Intrauterine pregnancy at 40 weeks.                               2. Chorioamnionitis.                               3. Arrested dilation.  PROCEDURE:                    Primary low transverse cesarean section without extension.  SURGEON:                      Zenaida Niece, M.D.  ANESTHESIA:                   Epidural.  ESTIMATED BLOOD LOSS:         800 cc.  FINDINGS:                     Normal female anatomy.  Delivery of a viable female infant with Apgars of 9 and 9, weight 7 pounds 15 ounces.  COUNTS:                       Correct.  CONDITION:                    Stable.  PROCEDURE IN DETAIL:          After appropriate informed consent was obtained, the patient was taken to the operating room and placed in the dorsal supine position.  Her previously placed epidural was dosed appropriately and her abdomen was prepped and draped in the usual sterile fashion.  A Foley catheter was inserted.  The level of her anesthesia was found to be adequate and her abdomen was entered via standard Pfannenstiel incision.  The vesicouterine peritoneum was incised and a bladder flap created digitally.  A 4 cm transverse incision was made in the lower uterine segment and extended digitally.  The fetal vertex was grasped and delivered through the incision atraumatically and a loose nuchal cord was reduced.  The fetal mouth and nares were suctioned and the remainder of the infant delivered atraumatically.  The cord was doubly clamped and cut.  The infant was  handed to the awaiting pediatric team.  Cord blood was obtained and the placenta was delivered with minimal manual assistance.  The uterine incision was inspected and found to be free  of extensions.  The uterus was wiped with a clean lap pad to remove all clots and debris.  The incision was closed in one layer, being a running locking later with #1 chromic with adequate hemostasis.  Bleeding from the serosal edges was controlled with electrocautery.  Both pericolic gutters were blotted and all clots and debris removed.  Both tubes and ovaries were inspected and found to be normal.  The uterine incision was again inspected and found to be hemostatic.  The subfascial space was irrigated and mad hemostatic with electrocautery.  The fascia was closed in a running fashion starting at both ends and meeting in the middle with 0 Vicryl.  The subcutaneous tissue was irrigated and made hemostatic with electrocautery. This was then closed with running 2-0 plain gut suture.  The skin was then closed with staples and a sterile dressing.  The patient had some discomfort from her epidural but, overall, tolerated her procedure well and was taken to the recovery room in stable condition. DD:  09/22/00 TD:  09/22/00 Job: [id number]

## 2011-02-10 NOTE — Op Note (Signed)
NAME:  [name], [name]                        ACCOUNT NO.:  [id number]   MEDICAL RECORD NO.:  [id number]                   PATIENT TYPE:  AMB   LOCATION:  DAY                                  FACILITY:  [facility]   PHYSICIAN:  Sandria Bales. Ezzard Standing, M.D.               DATE OF BIRTH:  12-12-1975   DATE OF PROCEDURE:  04/18/2004  DATE OF DISCHARGE:                                 OPERATIVE REPORT   PREOPERATIVE DIAGNOSIS:  Recurrent left axillary abscess with residual cyst.   POSTOPERATIVE DIAGNOSIS:  Recurrent left axillary abscess.   PROCEDURE:  Excision of left axillary cyst/mass (approximately 3 x 12 cm).   SURGEON:  Sandria Bales. Ezzard Standing, M.D.   ANESTHESIA:  General endotracheal with approximately 18 mL of 0.25%  Marcaine.   COMPLICATIONS:  None.   INDICATION FOR PROCEDURE:  Ms. Hanback is a 35 year old black female who is  obese with a weight of approximately 300 pounds, who has had recurrent left  axillary abscesses requiring drainage in the office at least twice.  She had  some residual scar tissue and cystic tissue.  I discussed with her about  excising this in hope to prevent recurrent abscesses.  I also stressed to  her the need for weight loss in the future.  She understands the indications  and potential complications, the potential complications include but are not  limited to bleeding, infection, and recurrence of the abscesses.   The patient was placed in the supine position with the left arm actually at  about 120 degrees.  I had to have somebody hold the redundant skin out of  the way so that I could get to it.  I prepped the area with Betadine  solution and sterilely draped it.  I then excised a block of skin about 12  cm in length x 3 cm in width, excising where she had had prior abscesses and  scar tissue.   I then infiltrated the skin with 0.25% Marcaine.  I used Bovie  electrocautery to control bleeding.  I tried to put no foreign body such as  sutures in the  subcutaneous tissues because of recurrent abscesses and  closed the skin with interrupted 3-0 nylon sutures.   She tolerated the procedure well, was transported to the recovery room in  good condition, and will be discharged home today, return to see me in 10  days for suture removal.                                               Sandria Bales. Ezzard Standing, M.D.    DHN/MEDQ  D:  04/18/2004  T:  04/18/2004  Job:  [id number]   cc:   Luanna Cole. Lenord Fellers, M.D.  [address] [place]  [place]  [place] [address]  Fax:  [phone] 

## 2011-02-10 NOTE — Op Note (Signed)
[name], FAIDLEY              ACCOUNT NO.:  [id number]   MEDICAL RECORD NO.:  [id number]          PATIENT TYPE:  AMB   LOCATION:  DAY                          FACILITY:  [facility]   PHYSICIAN:  Adolph Pollack, M.D.DATE OF BIRTH:  1975/10/17   DATE OF PROCEDURE:  10/12/2005  DATE OF DISCHARGE:                                 OPERATIVE REPORT   PREOPERATIVE DIAGNOSIS:  Soft tissue mass, right back.   POSTOPERATIVE DIAGNOSIS:  3 cm soft tissue mass, right back.   PROCEDURE:  Excision of 3 cm soft tissue mass of right back.   SURGEON:  Adolph Pollack, M.D.   ANESTHESIA:  General.   INDICATIONS:  This 35 year old female has noticed a firm mass in the right  portion of her back that is becoming bothersome to her. On exam, it is a  firm fixed mass in the subcutaneous region measuring 3 cm. She now presents  for excision.   TECHNIQUE:  She was seen in the holding area. She said she had a little bit  of a sore throat and she did have a low grade fever, but her throat was  clear and she is feeling much better overall today. I decided to proceed  with surgery. I marked the mass in the right back area. She was then brought  to the operating room supine on the stretcher, a general anesthetic was  administered. She was then placed prone on the operating table with  appropriate pressure points padded. The right back area was sterilely  prepped and draped. A transverse incision made directly over the mass  through the skin and subcutaneous tissue. I could palpate the mass and it  was very firm. I used sharp dissection to raise flaps in all directions and  was able to sharply excise the mass. It was very firm measuring 3 cm and it  was sent to pathology.   Following this, I identified bleeding points and controlled them with  electrocautery. I then injected 0.5% plain Marcaine superficially and deep  for local anesthetic effect. I once again checked for bleeding and  hemostasis was  adequate. The wound was then closed in two layers closing the  subcutaneous tissue with running 3-0 Vicryl suture and closing the skin with  a running 4-0 Monocryl subcuticular stitch. Steri-Strips and sterile  dressing were applied.   She tolerated the procedure well without any apparent complications and was  taken to the recovery room in satisfactory condition.      Adolph Pollack, M.D.  Electronically Signed     TJR/MEDQ  D:  10/12/2005  T:  10/12/2005  Job:  [id number]

## 2011-02-10 NOTE — Op Note (Signed)
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   POSTOPERATIVE DIAGNOSIS:  Granular cell tumor of the back.   PROCEDURE:  Wide re-excision of granular cell tumor of the back.   SURGEON:  Adolph Pollack, M.D.   ANESTHESIA:  General plus 0.5% Marcaine for local effect.   INDICATIONS:  Ms. Stahlman is a 35 year old female who underwent a wide  excision of a granular cell tumor the back on October 25, 2005. At that time  there was just a soft tissue mass, but pathology returned granular cell  tumor with margins involved.  This was benign, although re-excision was  advised. After discussing this with the patient. She chose to proceed on  with re-excision and now presents for that.   TECHNIQUE:  She was seen in the holding area and then brought to the  operating room and a general anesthetic was given while she is on the  stretcher. She was then turned prone on the operating table with padding to  appropriate points. The previous scar and area around the right lower back  was sterilely prepped and draped. Local anesthetic was infiltrated  superficially and deep around this area. An elliptical incision was made  around the scar, sharply; and then using electrocautery, I raised flaps in  all directions. I then dissected the subcutaneous tissue down to the pre  fascial area and then excised a large area of subcutaneous tissue to include  the previous biopsy cavity. No obvious masses were noted. This was then sent  to pathology.   I did inject local anesthetic deep into the wound. Bleeding was controlled  with electrocautery. Once  hemostasis was adequate. I closed the wound in  layers using running a 2-0 Vicryl suture to reapproximate the subcutaneous  tissue in 2 separate layers. The skin was then closed with a 3-0 Monocryl  subcuticular stitch followed by Steri-Strips and sterile dressing. The final  wound measured about 6 cm.   She tolerated the procedure well without any apparent complications; and was  taken to the recovery room in satisfactory condition.      Adolph Pollack, M.D.  Electronically Signed     TJR/MEDQ  D:  11/23/2005  T:  11/23/2005  Job:  [id number]   cc:   Luanna Cole. Lenord Fellers, M.D.  Fax: [phone]

## 2011-09-26 HISTORY — PX: BREAST BIOPSY: SHX20

## 2011-12-02 ENCOUNTER — Other Ambulatory Visit: Payer: Self-pay

## 2011-12-02 ENCOUNTER — Emergency Department (HOSPITAL_COMMUNITY)
Admission: EM | Admit: 2011-12-02 | Discharge: 2011-12-03 | Disposition: A | Discharge status: Home / Self Care | Payer: Managed Care, Other (non HMO) | Attending: Emergency Medicine | Admitting: Emergency Medicine

## 2011-12-02 ENCOUNTER — Encounter (HOSPITAL_COMMUNITY): Payer: Self-pay | Admitting: *Deleted

## 2011-12-02 ENCOUNTER — Emergency Department (HOSPITAL_COMMUNITY): Payer: Managed Care, Other (non HMO)

## 2011-12-02 DIAGNOSIS — R110 Nausea: Secondary | ICD-10-CM | POA: Insufficient documentation

## 2011-12-02 DIAGNOSIS — R10813 Right lower quadrant abdominal tenderness: Secondary | ICD-10-CM | POA: Insufficient documentation

## 2011-12-02 DIAGNOSIS — R1013 Epigastric pain: Secondary | ICD-10-CM | POA: Insufficient documentation

## 2011-12-02 HISTORY — DX: Gastro-esophageal reflux disease without esophagitis: K21.9

## 2011-12-02 HISTORY — DX: Reserved for concepts with insufficient information to code with codable children: IMO0002

## 2011-12-02 MED ORDER — FAMOTIDINE 20 MG PO TABS
20.0000 mg | ORAL_TABLET | Freq: Once | ORAL | Status: AC
  Administered 2011-12-03: 20 mg via ORAL
  Filled 2011-12-02: qty 1

## 2011-12-02 MED ORDER — GI COCKTAIL ~~LOC~~
30.0000 mL | Freq: Once | ORAL | Status: AC
  Administered 2011-12-02: 30 mL via ORAL
  Filled 2011-12-02: qty 30

## 2011-12-02 MED ORDER — PANTOPRAZOLE SODIUM 40 MG IV SOLR
40.0000 mg | Freq: Once | INTRAVENOUS | Status: AC
  Administered 2011-12-03: 40 mg via INTRAVENOUS
  Filled 2011-12-02: qty 40

## 2011-12-02 MED ORDER — ONDANSETRON 8 MG PO TBDP
8.0000 mg | ORAL_TABLET | Freq: Once | ORAL | Status: AC
  Administered 2011-12-02: 8 mg via ORAL
  Filled 2011-12-02: qty 1

## 2011-12-02 MED ORDER — SODIUM CHLORIDE 0.9 % IV BOLUS (SEPSIS)
1000.0000 mL | Freq: Once | INTRAVENOUS | Status: AC
  Administered 2011-12-03: 1000 mL via INTRAVENOUS

## 2011-12-02 MED ORDER — HYDROMORPHONE HCL PF 1 MG/ML IJ SOLN
1.0000 mg | Freq: Once | INTRAMUSCULAR | Status: AC
  Administered 2011-12-03: 1 mg via INTRAVENOUS
  Filled 2011-12-02: qty 1

## 2011-12-02 NOTE — ED Notes (Signed)
Pt has hx of ulcers. Pt c/o abdominal pain since this morning, worsening tonight. Pt c/o nausea and dry heaves since yesterday. Pt took carafate and percocet and protonix today w/o relief.

## 2011-12-03 LAB — CBC
Hemoglobin: 11.7 g/dL — ABNORMAL LOW (ref 12.0–15.0)
Platelets: 255 10*3/uL (ref 150–400)
RBC: 3.82 MIL/uL — ABNORMAL LOW (ref 3.87–5.11)
WBC: 7 10*3/uL (ref 4.0–10.5)

## 2011-12-03 LAB — COMPREHENSIVE METABOLIC PANEL
AST: 13 U/L (ref 0–37)
Albumin: 3.7 g/dL (ref 3.5–5.2)
Alkaline Phosphatase: 94 U/L (ref 39–117)
Chloride: 107 mEq/L (ref 96–112)
Potassium: 3.6 mEq/L (ref 3.5–5.1)
Sodium: 139 mEq/L (ref 135–145)
Total Bilirubin: 0.2 mg/dL — ABNORMAL LOW (ref 0.3–1.2)

## 2011-12-03 MED ORDER — HYDROMORPHONE HCL PF 1 MG/ML IJ SOLN
1.0000 mg | Freq: Once | INTRAMUSCULAR | Status: AC
  Administered 2011-12-03: 1 mg via INTRAVENOUS
  Filled 2011-12-03: qty 1

## 2011-12-03 MED ORDER — OXYCODONE-ACETAMINOPHEN 5-325 MG PO TABS: 2.0000 | ORAL_TABLET | ORAL | Status: AC | PRN

## 2011-12-03 MED ORDER — FAMOTIDINE 20 MG PO TABS: 20.0000 mg | ORAL_TABLET | Freq: Two times a day (BID) | ORAL | Status: AC

## 2011-12-03 NOTE — Discharge Instructions (Signed)

## 2011-12-03 NOTE — ED Provider Notes (Signed)
History     CSN: [id number]  Arrival date & time 12/02/11  2209   First MD Initiated Contact with Patient 12/02/11 2309      Chief Complaint  Patient presents with  . Abdominal Pain    (Consider location/radiation/quality/duration/timing/severity/associated sxs/prior treatment) Patient is a 36 y.o. female presenting with abdominal pain. The history is provided by the patient.  Abdominal Pain The primary symptoms of the illness include abdominal pain. The primary symptoms of the illness do not include fever, shortness of breath or dysuria. Episode onset: Today. The onset of the illness was gradual. The problem has been gradually worsening.  Associated with: Nothing. The patient states that she believes she is currently not pregnant. The patient has had a change in bowel habit. Symptoms associated with the illness do not include chills or back pain. Significant associated medical issues include PUD.   location epigastric region. Sharp in quality. No radiation. History of ulcers and feels the same. No blood in stools. No vomiting. No fevers.  Past Medical History  Diagnosis Date  . Ulcer   . Acid reflux     Past Surgical History  Procedure Date  . Roux-en-y procedure   . Abdominal surgery     History reviewed. No pertinent family history.  History  Substance Use Topics  . Smoking status: Current Some Day Smoker    Types: Cigarettes  . Smokeless tobacco: Not on file  . Alcohol Use: Yes     occasionally    OB History    Grav Para Term Preterm Abortions TAB SAB Ect Mult Living                  Review of Systems  Constitutional: Negative for fever and chills.  HENT: Negative for neck pain and neck stiffness.   Eyes: Negative for pain.  Respiratory: Negative for shortness of breath.   Cardiovascular: Negative for chest pain.  Gastrointestinal: Positive for abdominal pain.  Genitourinary: Negative for dysuria.  Musculoskeletal: Negative for back pain.  Skin: Negative  for rash.  Neurological: Negative for headaches.  All other systems reviewed and are negative.    Allergies  Review of patient's allergies indicates no known allergies.  Home Medications   Current Outpatient Rx  Name Route Sig Dispense Refill  . ACETAMINOPHEN 500 MG PO TABS Oral Take 1,000 mg by mouth every 6 (six) hours as needed. Pain    . PANTOPRAZOLE SODIUM 40 MG PO TBEC Oral Take 40 mg by mouth daily.    . SUCRALFATE 1 GM/10ML PO SUSP Oral Take 1 g by mouth 4 (four) times daily.      BP 120/56  Pulse 78  Temp(Src) 98.1 F (36.7 C) (Oral)  Resp 20  SpO2 100%  LMP 11/11/2011  Physical Exam  Constitutional: She is oriented to person, place, and time. She appears well-developed and well-nourished.  HENT:  Head: Normocephalic and atraumatic.  Eyes: Conjunctivae and EOM are normal. Pupils are equal, round, and reactive to light.  Neck: Trachea normal. Neck supple. No thyromegaly present.  Cardiovascular: Normal rate, regular rhythm, S1 normal, S2 normal and normal pulses.     No systolic murmur is present   No diastolic murmur is present  Pulses:      Radial pulses are 2+ on the right side, and 2+ on the left side.  Pulmonary/Chest: Effort normal and breath sounds normal. She has no wheezes. She has no rhonchi. She has no rales. She exhibits no tenderness.  Abdominal: Soft. Normal  appearance and bowel sounds are normal. She exhibits no distension. There is no rebound, no CVA tenderness and negative Murphy's sign.       Tender right lower quadrant with mild voluntary guarding  Genitourinary:       Brown stool nontender  Musculoskeletal:       BLE:s Calves nontender, no cords or erythema, negative Homans sign  Neurological: She is alert and oriented to person, place, and time. She has normal strength. No cranial nerve deficit or sensory deficit. GCS eye subscore is 4. GCS verbal subscore is 5. GCS motor subscore is 6.  Skin: Skin is warm and dry. No rash noted. She is not  diaphoretic.  Psychiatric: Her speech is normal.       Cooperative and appropriate    ED Course  Procedures (including critical care time)  Results for orders placed during the hospital encounter of 12/02/11  CBC      Component Value Range   WBC 7.0  4.0 - 10.5 (K/uL)   RBC 3.82 (*) 3.87 - 5.11 (MIL/uL)   Hemoglobin 11.7 (*) 12.0 - 15.0 (g/dL)   HCT 16.1 (*) 09.6 - 46.0 (%)   MCV 89.3  78.0 - 100.0 (fL)   MCH 30.6  26.0 - 34.0 (pg)   MCHC 34.3  30.0 - 36.0 (g/dL)   RDW 04.5  40.9 - 81.1 (%)   Platelets 255  150 - 400 (K/uL)  COMPREHENSIVE METABOLIC PANEL      Component Value Range   Sodium 139  135 - 145 (mEq/L)   Potassium 3.6  3.5 - 5.1 (mEq/L)   Chloride 107  96 - 112 (mEq/L)   CO2 24  19 - 32 (mEq/L)   Glucose, Bld 80  70 - 99 (mg/dL)   BUN 13  6 - 23 (mg/dL)   Creatinine, Ser 9.14  0.50 - 1.10 (mg/dL)   Calcium 9.1  8.4 - 78.2 (mg/dL)   Total Protein 7.2  6.0 - 8.3 (g/dL)   Albumin 3.7  3.5 - 5.2 (g/dL)   AST 13  0 - 37 (U/L)   ALT 12  0 - 35 (U/L)   Alkaline Phosphatase 94  39 - 117 (U/L)   Total Bilirubin 0.2 (*) 0.3 - 1.2 (mg/dL)   GFR calc non Af Amer >90  >90 (mL/min)   GFR calc Af Amer >90  >90 (mL/min)   Dg Chest 2 View  12/03/2011  *RADIOLOGY REPORT*  Clinical Data: Epigastric abdominal pain and nausea.  Smoker.  CHEST - 2 VIEW  Comparison: 02/10/2011.  Findings: Normal sized heart.  Clear lungs.  Minimal peribronchial thickening with mild improvement.  Minimal thoracic spine degenerative changes.  IMPRESSION: Minimal chronic bronchitic changes with mild improvement.  Original Report Authenticated By: Darrol Angel, M.D.   IV fluids and allowed improved pain. No improvement check cocktail. Patient requests discharge home at 4:40 AM and plans to followup with her physician in [place] on Monday.  MDM   Epigastric pain with history of peptic ulcer disease. Guaiac-negative stools. No significant anemia. Pain improved. Plan close followup. Reliable  historian and verbalizes understanding abdominal pain precautions        Sunnie Nielsen, MD 12/03/11 [phone]

## 2011-12-03 NOTE — ED Notes (Signed)
RN request to obtain labs with the start of iv

## 2012-07-04 ENCOUNTER — Other Ambulatory Visit: Payer: Self-pay | Admitting: Obstetrics and Gynecology

## 2012-07-04 DIAGNOSIS — N63 Unspecified lump in unspecified breast: Secondary | ICD-10-CM

## 2012-07-09 ENCOUNTER — Ambulatory Visit
Admission: RE | Admit: 2012-07-09 | Discharge: 2012-07-09 | Disposition: A | Discharge status: Home / Self Care | Payer: Managed Care, Other (non HMO) | Source: Ambulatory Visit | Attending: Obstetrics and Gynecology | Admitting: Obstetrics and Gynecology

## 2012-07-09 ENCOUNTER — Other Ambulatory Visit: Payer: Self-pay | Admitting: Obstetrics and Gynecology

## 2012-07-09 DIAGNOSIS — N63 Unspecified lump in unspecified breast: Secondary | ICD-10-CM

## 2012-07-17 ENCOUNTER — Ambulatory Visit
Admission: RE | Admit: 2012-07-17 | Discharge: 2012-07-17 | Disposition: A | Discharge status: Home / Self Care | Payer: Managed Care, Other (non HMO) | Source: Ambulatory Visit | Attending: Obstetrics and Gynecology | Admitting: Obstetrics and Gynecology

## 2012-07-17 DIAGNOSIS — N63 Unspecified lump in unspecified breast: Secondary | ICD-10-CM

## 2012-08-01 ENCOUNTER — Encounter (INDEPENDENT_AMBULATORY_CARE_PROVIDER_SITE_OTHER): Payer: Self-pay | Admitting: General Surgery

## 2012-08-01 ENCOUNTER — Ambulatory Visit (INDEPENDENT_AMBULATORY_CARE_PROVIDER_SITE_OTHER): Payer: Managed Care, Other (non HMO) | Admitting: General Surgery

## 2012-08-01 VITALS — BP 120/58 | HR 80 | Temp 97.4°F | Resp 16 | Ht 66.0 in | Wt 178.6 lb

## 2012-08-01 DIAGNOSIS — R590 Localized enlarged lymph nodes: Secondary | ICD-10-CM

## 2012-08-01 DIAGNOSIS — R599 Enlarged lymph nodes, unspecified: Secondary | ICD-10-CM

## 2012-08-01 NOTE — Progress Notes (Signed)
Subjective:     Patient ID: [name], female   DOB: Sep 25, 1976, 36 y.o.   MRN: [id number]  HPI We are asked to see the patient in consultation by Dr. Si Gaul to evaluate her for an enlarged right axillary lymph node. 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 She states that she has always had soreness in the right arm pit area. She recently thought she had a mass in her left breast and went to see her gynecologist. He sent her for mammograms and ultrasounds to workup a left breast mass. The workup for her left breast was negative but she was found to have an enlarged abnormal appearing right axillary lymph node. An attempt was made at core needle biopsy of this lymph node but the specimen was inadequate for any definitive diagnosis. She denies any recent unintentional weight loss. She denies any night sweats or fevers or chills. She is now for an open excisional biopsy of the right axillary lymph node.  Review of Systems  Constitutional: Negative.  Negative for fever, fatigue and unexpected weight change.  HENT: Negative.   Eyes: Negative.   Respiratory: Negative.   Cardiovascular: Negative.   Gastrointestinal: Negative.   Genitourinary: Negative.   Musculoskeletal: Negative.   Skin: Negative.   Neurological: Negative.   Hematological: Negative.   Psychiatric/Behavioral: Negative.        Objective:   Physical Exam  Constitutional: She is oriented to person, place, and time. She appears well-developed and well-nourished.  HENT:  Head: Normocephalic and atraumatic.  Eyes: Conjunctivae normal and EOM are normal. Pupils are equal, round, and reactive to light.  Neck: Normal range of motion. Neck supple.  Cardiovascular: Normal rate, regular rhythm and normal heart sounds.   Pulmonary/Chest: Effort normal and breath sounds normal.  Abdominal: Soft. Bowel sounds are normal.  Musculoskeletal:  Normal range of motion.       The patient has a moderately enlarged palpable mobile lymph node in the right axilla that is tender to palpation.  Neurological: She is alert and oriented to person, place, and time.  Skin: Skin is warm and dry.  Psychiatric: She has a normal mood and affect. Her behavior is normal.       Assessment:     The patient has an enlarged lymph node in the right axilla. Her medical doctors would like this biopsied. Core needle biopsy was unsuccessful. She will now need open excisional biopsy of this lymph node.    Plan:     At this point she is planning on having surgery on her legs by the plastic surgery department at [facility]. Her surgery is scheduled in the next 3 weeks or so. I will contact the plastic surgeon away for see if they would be willing to do the lymph node biopsy during her anesthetic. If they do not want to do this then we will plan to do an excisional biopsy in the near future of the right axillary lymph node. I have discussed with her in detail the risks and benefits of the operation as well as some of the technical aspects and she understands and wishes to proceed

## 2012-08-05 ENCOUNTER — Telehealth (INDEPENDENT_AMBULATORY_CARE_PROVIDER_SITE_OTHER): Payer: Self-pay

## 2012-08-05 NOTE — Telephone Encounter (Signed)
Pt calling requesting Dr. Billey Chang nurse call her back regarding possible referral to [facility]?????  Pls call her on her cell [phone].

## 2013-01-09 ENCOUNTER — Emergency Department (HOSPITAL_COMMUNITY): Payer: Managed Care, Other (non HMO)

## 2013-01-09 ENCOUNTER — Emergency Department (HOSPITAL_COMMUNITY)
Admission: EM | Admit: 2013-01-09 | Discharge: 2013-01-10 | Disposition: A | Discharge status: Home / Self Care | Payer: Managed Care, Other (non HMO) | Attending: Emergency Medicine | Admitting: Emergency Medicine

## 2013-01-09 ENCOUNTER — Encounter (HOSPITAL_COMMUNITY): Payer: Self-pay | Admitting: Emergency Medicine

## 2013-01-09 DIAGNOSIS — Z862 Personal history of diseases of the blood and blood-forming organs and certain disorders involving the immune mechanism: Secondary | ICD-10-CM | POA: Insufficient documentation

## 2013-01-09 DIAGNOSIS — K2970 Gastritis, unspecified, without bleeding: Secondary | ICD-10-CM

## 2013-01-09 DIAGNOSIS — Z79899 Other long term (current) drug therapy: Secondary | ICD-10-CM | POA: Insufficient documentation

## 2013-01-09 DIAGNOSIS — Z3202 Encounter for pregnancy test, result negative: Secondary | ICD-10-CM | POA: Insufficient documentation

## 2013-01-09 DIAGNOSIS — Z872 Personal history of diseases of the skin and subcutaneous tissue: Secondary | ICD-10-CM | POA: Insufficient documentation

## 2013-01-09 DIAGNOSIS — R112 Nausea with vomiting, unspecified: Secondary | ICD-10-CM | POA: Insufficient documentation

## 2013-01-09 DIAGNOSIS — R1012 Left upper quadrant pain: Secondary | ICD-10-CM | POA: Insufficient documentation

## 2013-01-09 DIAGNOSIS — K2990 Gastroduodenitis, unspecified, without bleeding: Secondary | ICD-10-CM | POA: Insufficient documentation

## 2013-01-09 DIAGNOSIS — Z9884 Bariatric surgery status: Secondary | ICD-10-CM | POA: Insufficient documentation

## 2013-01-09 DIAGNOSIS — R109 Unspecified abdominal pain: Secondary | ICD-10-CM

## 2013-01-09 DIAGNOSIS — F17200 Nicotine dependence, unspecified, uncomplicated: Secondary | ICD-10-CM | POA: Insufficient documentation

## 2013-01-09 DIAGNOSIS — K219 Gastro-esophageal reflux disease without esophagitis: Secondary | ICD-10-CM | POA: Insufficient documentation

## 2013-01-09 LAB — COMPREHENSIVE METABOLIC PANEL
AST: 43 U/L — ABNORMAL HIGH (ref 0–37)
BUN: 8 mg/dL (ref 6–23)
CO2: 27 mEq/L (ref 19–32)
Calcium: 9.2 mg/dL (ref 8.4–10.5)
Creatinine, Ser: 0.54 mg/dL (ref 0.50–1.10)
GFR calc Af Amer: >90 mL/min (ref >90)
GFR calc non Af Amer: >90 mL/min (ref >90)
Glucose, Bld: 84 mg/dL (ref 70–99)

## 2013-01-09 LAB — CBC WITH DIFFERENTIAL/PLATELET
Basophils Absolute: 0 10*3/uL (ref 0.0–0.1)
Eosinophils Relative: 3 % (ref 0–5)
HCT: 33.6 % — ABNORMAL LOW (ref 36.0–46.0)
Lymphocytes Relative: 51 % — ABNORMAL HIGH (ref 12–46)
MCV: 86.8 fL (ref 78.0–100.0)
Monocytes Absolute: 0.4 10*3/uL (ref 0.1–1.0)
RDW: 16.5 % — ABNORMAL HIGH (ref 11.5–15.5)
WBC: 6 10*3/uL (ref 4.0–10.5)

## 2013-01-09 LAB — URINALYSIS, ROUTINE W REFLEX MICROSCOPIC
Leukocytes, UA: NEGATIVE
Nitrite: NEGATIVE
Protein, ur: NEGATIVE mg/dL
Urobilinogen, UA: 0.2 mg/dL (ref 0.0–1.0)

## 2013-01-09 LAB — POTASSIUM: Potassium: 3.5 mEq/L (ref 3.5–5.1)

## 2013-01-09 LAB — POCT PREGNANCY, URINE: Preg Test, Ur: NEGATIVE

## 2013-01-09 LAB — LIPASE, BLOOD: Lipase: 30 U/L (ref 11–59)

## 2013-01-09 MED ORDER — OXYCODONE-ACETAMINOPHEN 5-325 MG PO TABS: 1.0000 | ORAL_TABLET | Freq: Four times a day (QID) | ORAL | Status: DC | PRN

## 2013-01-09 MED ORDER — HYDROMORPHONE HCL PF 1 MG/ML IJ SOLN
0.5000 mg | Freq: Once | INTRAMUSCULAR | Status: AC
  Administered 2013-01-09: 0.5 mg via INTRAVENOUS
  Filled 2013-01-09: qty 1

## 2013-01-09 MED ORDER — ONDANSETRON HCL 4 MG/2ML IJ SOLN
4.0000 mg | Freq: Once | INTRAMUSCULAR | Status: AC
  Administered 2013-01-09: 4 mg via INTRAVENOUS
  Filled 2013-01-09: qty 2

## 2013-01-09 MED ORDER — ONDANSETRON 8 MG PO TBDP: 8.0000 mg | ORAL_TABLET | Freq: Three times a day (TID) | ORAL | Status: DC | PRN

## 2013-01-09 MED ORDER — SODIUM CHLORIDE 0.9 % IV BOLUS (SEPSIS)
500.0000 mL | Freq: Once | INTRAVENOUS | Status: AC
  Administered 2013-01-09: 500 mL via INTRAVENOUS

## 2013-01-09 NOTE — ED Provider Notes (Signed)
History     CSN: [id number]  Arrival date & time 01/09/13  1947   First MD Initiated Contact with Patient 01/09/13 2029      Chief Complaint  Patient presents with  . Abdominal Pain    (Consider location/radiation/quality/duration/timing/severity/associated sxs/prior treatment) Patient is a 37 y.o. female presenting with abdominal pain. The history is provided by the patient.  Abdominal Pain Associated symptoms: nausea and vomiting   Associated symptoms: no chest pain, no diarrhea and no shortness of breath    patient developed upper abdominal pain yesterday. She states it feels like her previous ulcers. 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 She states she's had numerous groups. She states normally the pain will be relieved with her proton and Carafate. She states this time has not gotten better. She's had nausea and some vomiting. She states she's had a decreased appetite. No fevers. No diarrhea. The pain is dull and constant. There is some crampiness to it. No recent alcohol intake. She still has her gallbladder. Past Medical History  Diagnosis Date  . Ulcer   . Acid reflux   . Anemia     Past Surgical History  Procedure Laterality Date  . Roux-en-y procedure    . Abdominal surgery    . Tumor removal    . Wrist ganglion excision    . Cosmetic surgery      tummy tuck  . Hernia repair      Family History  Problem Relation Age of Onset  . Cancer Mother     kidney  . Cancer Paternal Aunt     History  Substance Use Topics  . Smoking status: Current Some Day Smoker    Types: Cigarettes  . Smokeless tobacco: Not on file  . Alcohol Use: Yes     Comment: occasionally    OB History   Grav Para Term Preterm Abortions TAB SAB Ect Mult Living                  Review of Systems  Constitutional: Negative for activity change and appetite change.  HENT: Negative for neck stiffness.   Eyes: Negative for pain.  Respiratory:  Negative for chest tightness and shortness of breath.   Cardiovascular: Negative for chest pain and leg swelling.  Gastrointestinal: Positive for nausea, vomiting and abdominal pain. Negative for diarrhea.  Genitourinary: Negative for flank pain.  Musculoskeletal: Negative for back pain.  Skin: Negative for rash.  Neurological: Negative for weakness, numbness and headaches.  Psychiatric/Behavioral: Negative for behavioral problems.    Allergies  Review of patient's allergies indicates no known allergies.  Home Medications   Current Outpatient Rx  Name  Route  Sig  Dispense  Refill  . acetaminophen (TYLENOL) 500 MG tablet   Oral   Take 1,000 mg by mouth every 6 (six) hours as needed for pain.          . calcium-vitamin D (OSCAL WITH D) 500-200 MG-UNIT per tablet   Oral   Take 1 tablet by mouth daily.         . pantoprazole (PROTONIX) 40 MG tablet   Oral   Take 40 mg by mouth daily as needed (for heartburn.).          Marland Kitchen sucralfate (CARAFATE) 1 GM/10ML suspension   Oral   Take 1 g by mouth 4 (four) times daily as needed (for ulcer pain.).          Marland Kitchen vitamin  B-12 (CYANOCOBALAMIN) 1000 MCG tablet   Oral   Take 1,000 mcg by mouth daily.         . ondansetron (ZOFRAN-ODT) 8 MG disintegrating tablet   Oral   Take 1 tablet (8 mg total) by mouth every 8 (eight) hours as needed for nausea.   20 tablet   0   . oxyCODONE-acetaminophen (PERCOCET/ROXICET) 5-325 MG per tablet   Oral   Take 1-2 tablets by mouth every 6 (six) hours as needed for pain.   10 tablet   0     BP 141/99  Pulse 72  Temp(Src) 97.6 F (36.4 C) (Oral)  Resp 18  SpO2 100%  LMP 12/06/2012  Physical Exam  Nursing note and vitals reviewed. Constitutional: She is oriented to person, place, and time. She appears well-developed and well-nourished.  HENT:  Head: Normocephalic and atraumatic.  Eyes: EOM are normal. Pupils are equal, round, and reactive to light.  Neck: Normal range of motion.  Neck supple.  Cardiovascular: Normal rate, regular rhythm and normal heart sounds.   No murmur heard. Pulmonary/Chest: Effort normal and breath sounds normal. No respiratory distress. She has no wheezes. She has no rales.  Abdominal: Soft. Bowel sounds are normal. She exhibits no distension. There is tenderness. There is no rebound and no guarding.  Mild left upper quadrant tenderness without rebound or guarding.  Musculoskeletal: Normal range of motion.  Neurological: She is alert and oriented to person, place, and time. No cranial nerve deficit.  Skin: Skin is warm and dry.  Psychiatric: She has a normal mood and affect. Her speech is normal.    ED Course  Procedures (including critical care time)  Labs Reviewed  CBC WITH DIFFERENTIAL - Abnormal; Notable for the following:    Hemoglobin 11.2 (*)    HCT 33.6 (*)    RDW 16.5 (*)    Platelets 419 (*)    Neutrophils Relative 39 (*)    Lymphocytes Relative 51 (*)    All other components within normal limits  COMPREHENSIVE METABOLIC PANEL - Abnormal; Notable for the following:    Potassium 6.3 (*)    AST 43 (*)    All other components within normal limits  LIPASE, BLOOD  URINALYSIS, ROUTINE W REFLEX MICROSCOPIC  POTASSIUM  POCT PREGNANCY, URINE   Dg Abd 2 Views  01/09/2013  *RADIOLOGY REPORT*  Clinical Data: Upper abdominal pain.  ABDOMEN - 2 VIEW  Comparison: 02/11/2011  Findings: There is no free air in the abdomen.  No dilated loops of large or small bowel.  Surgical staples are seen in the region of the fundus of the stomach.  No osseous abnormality.  IMPRESSION: Benign-appearing abdomen.   Original Report Authenticated By: Francene Boyers, M.D.      1. Abdominal pain   2. Gastritis       MDM  Patient with abdominal pain. History of same with her ulcers. Laboratories overall reassuring. Potassium was initially falsely elevated due to hemolysis. Recheck was normal. X-ray does not show obstruction. Patient feels somewhat  better after pain meds. She'll be discharged home to follow with her gastroenterologist        Juliet Rude. Rubin Payor, MD 01/09/13 2328

## 2013-01-09 NOTE — ED Notes (Signed)
Attempted to discharge pt, however pt is crying loudly, stating she can't go until we do something about her pain. Dr Rubin Payor notified.

## 2013-01-09 NOTE — ED Notes (Signed)
Pt states she is having abd pain that started on Wednesday   Pt states has has ulcers and has been taking her carafate and protonix for it without relief  Pt states she started having vomiting today and is unable to keep anything down  Pt states she has been using tylenol without relief

## 2014-11-22 ENCOUNTER — Emergency Department (HOSPITAL_BASED_OUTPATIENT_CLINIC_OR_DEPARTMENT_OTHER)
Admission: EM | Admit: 2014-11-22 | Discharge: 2014-11-22 | Disposition: A | Discharge status: Home / Self Care | Payer: Managed Care, Other (non HMO) | Attending: Emergency Medicine | Admitting: Emergency Medicine

## 2014-11-22 ENCOUNTER — Emergency Department (HOSPITAL_BASED_OUTPATIENT_CLINIC_OR_DEPARTMENT_OTHER): Payer: Managed Care, Other (non HMO)

## 2014-11-22 ENCOUNTER — Encounter (HOSPITAL_BASED_OUTPATIENT_CLINIC_OR_DEPARTMENT_OTHER): Payer: Self-pay | Admitting: *Deleted

## 2014-11-22 DIAGNOSIS — K219 Gastro-esophageal reflux disease without esophagitis: Secondary | ICD-10-CM | POA: Diagnosis not present

## 2014-11-22 DIAGNOSIS — Z79899 Other long term (current) drug therapy: Secondary | ICD-10-CM | POA: Insufficient documentation

## 2014-11-22 DIAGNOSIS — Y998 Other external cause status: Secondary | ICD-10-CM | POA: Diagnosis not present

## 2014-11-22 DIAGNOSIS — Y9389 Activity, other specified: Secondary | ICD-10-CM | POA: Diagnosis not present

## 2014-11-22 DIAGNOSIS — Z720 Tobacco use: Secondary | ICD-10-CM | POA: Diagnosis not present

## 2014-11-22 DIAGNOSIS — Z862 Personal history of diseases of the blood and blood-forming organs and certain disorders involving the immune mechanism: Secondary | ICD-10-CM | POA: Insufficient documentation

## 2014-11-22 DIAGNOSIS — S8391XA Sprain of unspecified site of right knee, initial encounter: Secondary | ICD-10-CM | POA: Diagnosis not present

## 2014-11-22 DIAGNOSIS — S8992XA Unspecified injury of left lower leg, initial encounter: Secondary | ICD-10-CM | POA: Diagnosis present

## 2014-11-22 DIAGNOSIS — Y9289 Other specified places as the place of occurrence of the external cause: Secondary | ICD-10-CM | POA: Diagnosis not present

## 2014-11-22 DIAGNOSIS — X58XXXA Exposure to other specified factors, initial encounter: Secondary | ICD-10-CM | POA: Insufficient documentation

## 2014-11-22 DIAGNOSIS — M25561 Pain in right knee: Secondary | ICD-10-CM

## 2014-11-22 MED ORDER — OXYCODONE-ACETAMINOPHEN 5-325 MG PO TABS: 1.0000 | ORAL_TABLET | ORAL | Status: DC | PRN

## 2014-11-22 MED ORDER — IBUPROFEN 600 MG PO TABS: 600.0000 mg | ORAL_TABLET | Freq: Four times a day (QID) | ORAL | Status: DC | PRN

## 2014-11-22 MED ORDER — OXYCODONE-ACETAMINOPHEN 5-325 MG PO TABS
1.0000 | ORAL_TABLET | Freq: Once | ORAL | Status: AC
Start: 2014-11-22 — End: 2014-11-22
  Administered 2014-11-22: 1 via ORAL
  Filled 2014-11-22: qty 1

## 2014-11-22 NOTE — ED Notes (Signed)
Patient was playing with son last night and her right twisted and her right knee is now painful, started as sore last night. Worsening today

## 2014-11-22 NOTE — Discharge Instructions (Signed)

## 2014-11-22 NOTE — ED Provider Notes (Signed)
CSN: [id number]     Arrival date & time 11/22/14  1207 History   First MD Initiated Contact with Patient 11/22/14 1233     Chief Complaint  Patient presents with  . Knee Injury     (Consider location/radiation/quality/duration/timing/severity/associated sxs/prior Treatment) HPI Comments: Patient complains of pain to her right knee. She states she was playing with her son last night and twisted it. She felt a pop on the inside part of her knee. She's had some pain and swelling to her knee since that time. She hasn't taken anything for the pain. She denies any other injuries.   Past Medical History  Diagnosis Date  . Ulcer   . Acid reflux   . Anemia    Past Surgical History  Procedure Laterality Date  . Roux-en-y procedure    . Abdominal surgery    . Tumor removal    . Wrist ganglion excision    . Cosmetic surgery      tummy tuck  . Hernia repair     Family History  Problem Relation Age of Onset  . Cancer Mother     kidney  . Cancer Paternal Aunt    History  Substance Use Topics  . Smoking status: Current Some Day Smoker    Types: Cigarettes  . Smokeless tobacco: Not on file  . Alcohol Use: Yes     Comment: occasionally   OB History    No data available     Review of Systems  Constitutional: Negative for fever.  Gastrointestinal: Negative for nausea and vomiting.  Musculoskeletal: Positive for joint swelling and arthralgias. Negative for back pain and neck pain.  Skin: Negative for wound.  Neurological: Negative for weakness, numbness and headaches.      Allergies  Review of patient's allergies indicates no known allergies.  Home Medications   Prior to Admission medications   Medication Sig Start Date End Date Taking? Authorizing Provider  calcium-vitamin D (OSCAL WITH D) 500-200 MG-UNIT per tablet Take 1 tablet by mouth daily.    Historical Provider, MD  ondansetron (ZOFRAN-ODT) 8 MG disintegrating tablet Take 1 tablet (8 mg total) by mouth every 8  (eight) hours as needed for nausea. 01/09/13   Juliet RudeNathan R. Pickering, MD  oxyCODONE-acetaminophen (PERCOCET) 5-325 MG per tablet Take 1-2 tablets by mouth every 4 (four) hours as needed. 11/22/14   Rolan BuccoMelanie Shellie Rogoff, MD  pantoprazole (PROTONIX) 40 MG tablet Take 40 mg by mouth daily as needed (for heartburn.).     Historical Provider, MD  sucralfate (CARAFATE) 1 GM/10ML suspension Take 1 g by mouth 4 (four) times daily as needed (for ulcer pain.).     Historical Provider, MD  vitamin B-12 (CYANOCOBALAMIN) 1000 MCG tablet Take 1,000 mcg by mouth daily.    Historical Provider, MD   BP 136/84 mmHg  Pulse 99  Temp(Src) 98.8 F (37.1 C)  Resp 18  Ht 5\' 6"  (1.676 m)  Wt 189 lb (85.73 kg)  BMI 30.52 kg/m2  SpO2 100%  LMP 10/20/2014 Physical Exam  Constitutional: She is oriented to person, place, and time. She appears well-developed and well-nourished.  HENT:  Head: Normocephalic and atraumatic.  Neck: Normal range of motion. Neck supple.  Cardiovascular: Normal rate.   Pulmonary/Chest: Effort normal.  Musculoskeletal: She exhibits edema and tenderness.  Positive tenderness to the medial aspect of the right knee. There some mild swelling to the knee. I'm unable to get an adequate ligament exam due to her discomfort but there is no obvious laxity.  She has no pain to the hip or the ankle. She's able to do a straight leg raise. She has normal pulses and sensation as well as motor function in the foot.  Neurological: She is alert and oriented to person, place, and time.  Skin: Skin is warm and dry.  Psychiatric: She has a normal mood and affect.    ED Course  Procedures (including critical care time) Labs Review Labs Reviewed - No data to display  Imaging Review Dg Knee Complete 4 Views Right  11/22/2014   CLINICAL DATA:  Patient states she was playing with her son last night and twisted her right knee and felt a pop. States she has been having medial pain since  EXAM: RIGHT KNEE - COMPLETE 4+ VIEW   COMPARISON:  None.  FINDINGS: No fracture of the proximal tibia or distal femur. Patella is normal. No joint effusion.  IMPRESSION: No acute cardiopulmonary process.   Electronically Signed   By: Genevive Bi M.D.   On: 11/22/2014 14:01     EKG Interpretation None      MDM   Final diagnoses:  Knee pain, acute, right  Right knee sprain, initial encounter    No fractures are identified. I was unable to get adequate ligament exam due to patient's discomfort. We'll go ahead and place her in a knee immobilizer. She states she had he has crutches at home to use. She was given prescription for Percocet to use for pain. She states she can't use NSAIDs due to her past gastric bypass surgery. She will follow-up with [facility] orthopedics for recheck on her knee.    Rolan Bucco, MD 11/22/14 1440

## 2016-11-15 ENCOUNTER — Encounter (HOSPITAL_BASED_OUTPATIENT_CLINIC_OR_DEPARTMENT_OTHER): Payer: Self-pay

## 2016-11-15 ENCOUNTER — Emergency Department (HOSPITAL_BASED_OUTPATIENT_CLINIC_OR_DEPARTMENT_OTHER)
Admission: EM | Admit: 2016-11-15 | Discharge: 2016-11-15 | Disposition: A | Discharge status: Home / Self Care | Payer: 59 | Attending: Emergency Medicine | Admitting: Emergency Medicine

## 2016-11-15 DIAGNOSIS — F17210 Nicotine dependence, cigarettes, uncomplicated: Secondary | ICD-10-CM | POA: Insufficient documentation

## 2016-11-15 DIAGNOSIS — M5442 Lumbago with sciatica, left side: Secondary | ICD-10-CM | POA: Diagnosis not present

## 2016-11-15 DIAGNOSIS — M545 Low back pain: Secondary | ICD-10-CM | POA: Diagnosis present

## 2016-11-15 DIAGNOSIS — M5432 Sciatica, left side: Secondary | ICD-10-CM

## 2016-11-15 LAB — URINALYSIS, MICROSCOPIC (REFLEX): RBC / HPF: NONE SEEN RBC/hpf (ref 0–5)

## 2016-11-15 LAB — URINALYSIS, ROUTINE W REFLEX MICROSCOPIC
Bilirubin Urine: NEGATIVE
Glucose, UA: NEGATIVE mg/dL
Hgb urine dipstick: NEGATIVE
Ketones, ur: NEGATIVE mg/dL
NITRITE: NEGATIVE
PH: 5.5 (ref 5.0–8.0)
Protein, ur: NEGATIVE mg/dL
SPECIFIC GRAVITY, URINE: 1.004 — AB (ref 1.005–1.030)

## 2016-11-15 MED ORDER — IBUPROFEN 800 MG PO TABS: 800.0000 mg | ORAL_TABLET | Freq: Three times a day (TID) | ORAL | 0 refills | Status: DC

## 2016-11-15 MED ORDER — OXYCODONE-ACETAMINOPHEN 5-325 MG PO TABS
1.0000 | ORAL_TABLET | Freq: Once | ORAL | Status: AC
  Administered 2016-11-15: 1 via ORAL
  Filled 2016-11-15: qty 1

## 2016-11-15 NOTE — ED Notes (Signed)
Extra pillow and warm blanket given to Pt.

## 2016-11-15 NOTE — ED Notes (Signed)
ED Provider at bedside. 

## 2016-11-15 NOTE — ED Triage Notes (Signed)
C/o left lower back pain that radiates down left leg x 4 days-denies injury-slow steady gait to triage

## 2016-11-15 NOTE — ED Provider Notes (Signed)
[facility] Provider Note   CSN: [id number] Arrival date & time: 11/15/16  1755  By signing my name below, I, [name], attest that this documentation has been prepared under the direction and in the presence of [name], PA-C. Electronically Signed: Linna Chambers, Scribe. 11/15/2016. 10:56 PM.  History   Chief Complaint Chief Complaint  Patient presents with  . Pain    The history is provided by the patient. No language interpreter was used.     HPI Comments: [name] is a 41 y.o. female who presents to the Emergency Department complaining of constant, gradually worsening, left buttock pain shooting down her left leg beginning three days ago. She states she did her "usual" weekend activities four days ago which included shopping and frequent movement throughout the day. Pt reports she woke up the following day with mild left buttock pain and took ibuprofen with good improvement. She states the pain has gradually worsened since onset and was severe upon waking this morning. Pt endorses significant pain exacerbation with bearing weight on her LLE and ambulating. She tried Bengay and heat therapy in addition to ibuprofen PTA today with no improvement of her pain. No trauma or injury to her back or left buttock. No h/o similar pain. No h/o IV drug use or immunocompromised conditions. No anticoagulants or regular medications. She denies joint swelling, numbness/tingling, urinary/bowel incontinence, significant weight changes, or any other associated symptoms.  Past Medical History:  Diagnosis Date  . Acid reflux   . Anemia   . Ulcer [facility])     Patient Active Problem List   Diagnosis Date Noted  . Enlarged lymph nodes in armpit 08/01/2012    Past Surgical History:  Procedure Laterality Date  . ABDOMINAL SURGERY    . COSMETIC SURGERY     tummy tuck  . HERNIA REPAIR    . ROUX-EN-Y PROCEDURE    . TUMOR REMOVAL    . WRIST GANGLION EXCISION      OB  History    No data available       Home Medications    Prior to Admission medications   Medication Sig Start Date End Date Taking? Authorizing Provider  ibuprofen (ADVIL,MOTRIN) 800 MG tablet Take 1 tablet (800 mg total) by mouth 3 (three) times daily. 11/15/16   [name], PA-C    Family History Family History  Problem Relation Age of Onset  . Cancer Mother     kidney  . Cancer Paternal Aunt     Social History Social History  Substance Use Topics  . Smoking status: Current Some Day Smoker    Types: Cigarettes  . Smokeless tobacco: Never Used  . Alcohol use Yes     Comment: occasionally     Allergies   Patient has no known allergies.   Review of Systems Review of Systems  Constitutional: Negative for chills, fever and unexpected weight change.  Cardiovascular: Negative for chest pain.  Gastrointestinal: Negative for abdominal pain, nausea and vomiting.       Negative for bowel incontinence.  Genitourinary:       Negative for urinary incontinence.  Musculoskeletal: Positive for myalgias. Negative for joint swelling, neck pain and neck stiffness.  Skin: Negative for color change, pallor, rash and wound.  Allergic/Immunologic: Negative for immunocompromised state.  Neurological: Negative for weakness and numbness.  Hematological: Does not bruise/bleed easily.     Physical Exam Updated Vital Signs BP 154/96 (BP Location: Right Wrist)   Pulse 81  Temp 99.5 F (37.5 C) (Oral)   Resp 18   LMP 11/07/2016   SpO2 100%   Physical Exam  Constitutional: She is oriented to person, place, and time. She appears well-developed and well-nourished. No distress.  Patient is afebrile, non-toxic appearing, sitting comfortably in chair in no acute distress.  HENT:  Head: Normocephalic and atraumatic.  Eyes: Conjunctivae and EOM are normal.  Neck: Normal range of motion. Neck supple. No tracheal deviation present.  Cardiovascular: Normal rate, regular rhythm,  normal heart sounds and intact distal pulses.   Pulmonary/Chest: Effort normal and breath sounds normal. No respiratory distress. She has no wheezes.  Musculoskeletal: Normal range of motion. She exhibits tenderness. She exhibits no edema or deformity.  Left gluteal muscle  Neurological: She is alert and oriented to person, place, and time. No sensory deficit. She exhibits normal muscle tone. Coordination normal.  Skin: Skin is warm and dry. No rash noted. She is not diaphoretic. No erythema. No pallor.  Psychiatric: She has a normal mood and affect. Her behavior is normal.  Nursing note and vitals reviewed.    ED Treatments / Results  Labs (all labs ordered are listed, but only abnormal results are displayed) Labs Reviewed  URINALYSIS, ROUTINE W REFLEX MICROSCOPIC - Abnormal; Notable for the following:       Result Value   Specific Gravity, Urine 1.004 (*)    Leukocytes, UA SMALL (*)    All other components within normal limits  URINALYSIS, MICROSCOPIC (REFLEX) - Abnormal; Notable for the following:    Bacteria, UA RARE (*)    Squamous Epithelial / LPF 0-5 (*)    All other components within normal limits    EKG  EKG Interpretation None       Radiology No results found.  Procedures Procedures (including critical care time)  DIAGNOSTIC STUDIES: Oxygen Saturation is 99% on RA, normal by my interpretation.    COORDINATION OF CARE: 11:09 PM Discussed treatment plan with pt at bedside and pt agreed to plan.  Medications Ordered in ED Medications  oxyCODONE-acetaminophen (PERCOCET/ROXICET) 5-325 MG per tablet 1 tablet (1 tablet Oral Given 11/15/16 2319)     Initial Impression / Assessment and Plan / ED Course  I have reviewed the triage vital signs and the nursing notes.  Pertinent labs & imaging results that were available during my care of the patient were reviewed by me and considered in my medical decision making (see chart for details).    Patient presents with  symptoms consistent with left sided sciatica. Provided patient with ice and pain relief while in the Ed.  Discharge home with ibuprofen and close PCP follow up.  Discussed strict return precautions. Patient was advised to return to the emergency department if experiencing any new or worsening symptoms. Patient clearly understood instructions and agreed with discharge plan. Patient was discussed with Dr. Clarene Duke who agrees with assessment and plan.  Final Clinical Impressions(s) / ED Diagnoses   Final diagnoses:  Sciatica of left side    New Prescriptions Discharge Medication List as of 11/15/2016 11:25 PM    START taking these medications   Details  ibuprofen (ADVIL,MOTRIN) 800 MG tablet Take 1 tablet (800 mg total) by mouth 3 (three) times daily., Starting Wed 11/15/2016, Print       I personally performed the services described in this documentation, which was scribed in my presence. The recorded information has been reviewed and is accurate.    [name], PA-C 11/16/16 [phone]  Laurence Spatesachel Morgan Little, MD 11/18/16 [phone]

## 2018-05-31 ENCOUNTER — Other Ambulatory Visit: Payer: Self-pay | Admitting: Obstetrics and Gynecology

## 2018-05-31 DIAGNOSIS — Z1231 Encounter for screening mammogram for malignant neoplasm of breast: Secondary | ICD-10-CM

## 2018-06-27 ENCOUNTER — Ambulatory Visit
Admission: RE | Admit: 2018-06-27 | Discharge: 2018-06-27 | Disposition: A | Discharge status: Home / Self Care | Payer: 59 | Source: Ambulatory Visit | Attending: Obstetrics and Gynecology | Admitting: Obstetrics and Gynecology

## 2018-06-27 DIAGNOSIS — Z1231 Encounter for screening mammogram for malignant neoplasm of breast: Secondary | ICD-10-CM

## 2018-08-04 ENCOUNTER — Encounter (HOSPITAL_COMMUNITY): Payer: Self-pay | Admitting: Emergency Medicine

## 2018-08-04 ENCOUNTER — Other Ambulatory Visit: Payer: Self-pay

## 2018-08-04 ENCOUNTER — Emergency Department (HOSPITAL_COMMUNITY)
Admission: EM | Admit: 2018-08-04 | Discharge: 2018-08-05 | Disposition: A | Discharge status: Home / Self Care | Payer: 59 | Attending: Emergency Medicine | Admitting: Emergency Medicine

## 2018-08-04 DIAGNOSIS — F17210 Nicotine dependence, cigarettes, uncomplicated: Secondary | ICD-10-CM | POA: Diagnosis not present

## 2018-08-04 DIAGNOSIS — E876 Hypokalemia: Secondary | ICD-10-CM

## 2018-08-04 DIAGNOSIS — R748 Abnormal levels of other serum enzymes: Secondary | ICD-10-CM

## 2018-08-04 DIAGNOSIS — K2900 Acute gastritis without bleeding: Secondary | ICD-10-CM | POA: Insufficient documentation

## 2018-08-04 DIAGNOSIS — R1013 Epigastric pain: Secondary | ICD-10-CM | POA: Diagnosis present

## 2018-08-04 MED ORDER — PANTOPRAZOLE SODIUM 40 MG IV SOLR
40.0000 mg | Freq: Once | INTRAVENOUS | Status: DC
  Filled 2018-08-04: qty 40

## 2018-08-04 MED ORDER — SODIUM CHLORIDE 0.9 % IV BOLUS
1000.0000 mL | Freq: Once | INTRAVENOUS | Status: AC
  Administered 2018-08-05: 1000 mL via INTRAVENOUS

## 2018-08-04 MED ORDER — ONDANSETRON HCL 4 MG/2ML IJ SOLN
4.0000 mg | Freq: Once | INTRAMUSCULAR | Status: AC
  Administered 2018-08-05: 4 mg via INTRAVENOUS
  Filled 2018-08-04: qty 2

## 2018-08-04 NOTE — ED Triage Notes (Signed)
C/O of burning abdominal pain that began last Sunday. Then came back Saturday and has increased since.

## 2018-08-04 NOTE — ED Provider Notes (Signed)
[facility] EMERGENCY DEPARTMENT Provider Note   CSN: [id number] Arrival date & time: 08/04/18  2337     History   Chief Complaint Chief Complaint  Patient presents with  . Abdominal Pain    HPI [name] is a 42 y.o. female.  HPI  Patient is a 42 year old female with a history of GERD, anemia, peptic ulcer disease, gastric bypass, who presents emergency department today complaining of epigastric abdominal pain that has been present for 1 week.  Patient states pain feels like a burning sensation that is severe in nature.  It does not radiate.  Is worse with eating and is associated with nausea and vomiting.  States symptoms feel exactly like when she had peptic ulcers in the past.  States she tried to relieve her symptoms by taking ibuprofen.  States she took 800 mg ibuprofen daily.  Denies ASA use or BC powders.  Notes she drinks alcohol 2-3 times per week.  Drinks about 1 glass of wine during those days.  Denies diarrhea, constipation, fevers, chills, urinary symptoms, hematochezia or melena.  Denies chest pain or shortness of breath.  Pt is no currently on an antacid as she states that she no longer has ulcers.  Pt later admits to recent heavy ETOH use over the last week.   Past Medical History:  Diagnosis Date  . Acid reflux   . Anemia   . Ulcer     Patient Active Problem List   Diagnosis Date Noted  . Enlarged lymph nodes in armpit 08/01/2012    Past Surgical History:  Procedure Laterality Date  . ABDOMINAL SURGERY    . BREAST BIOPSY Right 2013  . COSMETIC SURGERY     tummy tuck  . HERNIA REPAIR    . ROUX-EN-Y PROCEDURE    . TUMOR REMOVAL    . WRIST GANGLION EXCISION       OB History   None      Home Medications    Prior to Admission medications   Medication Sig Start Date End Date Taking? Authorizing Provider  ibuprofen (ADVIL,MOTRIN) 800 MG tablet Take 1 tablet (800 mg total) by mouth 3 (three) times daily. Patient not  taking: Reported on 08/05/2018 11/15/16   Avie Echevaria B, PA-C  pantoprazole (PROTONIX) 20 MG tablet Take 1 tablet (20 mg total) by mouth daily for 14 days. 08/05/18 08/19/18  Renada Cronin S, PA-C  potassium chloride (K-DUR) 10 MEQ tablet Take 1 tablet (10 mEq total) by mouth daily for 7 days. 08/05/18 08/12/18  Delana Manganello S, PA-C    Family History Family History  Problem Relation Age of Onset  . Cancer Mother        kidney  . Cancer Paternal Aunt     Social History Social History   Tobacco Use  . Smoking status: Current Some Day Smoker    Types: Cigarettes  . Smokeless tobacco: Never Used  Substance Use Topics  . Alcohol use: Yes    Comment: occasionally  . Drug use: No     Allergies   Patient has no known allergies.   Review of Systems Review of Systems  Constitutional: Negative for chills and fever.  HENT: Negative for congestion, rhinorrhea and sore throat.   Eyes: Negative for visual disturbance.  Respiratory: Negative for cough and shortness of breath.   Cardiovascular: Negative for chest pain.  Gastrointestinal: Positive for abdominal pain, nausea and vomiting. Negative for blood in stool, constipation and diarrhea.  Genitourinary: Negative for  dysuria, hematuria and urgency.  Musculoskeletal: Negative for back pain.  Skin: Negative for rash.  Neurological: Negative for headaches.   Physical Exam Updated Vital Signs BP (!) 160/97   Pulse 90   Temp 97.9 F (36.6 C) (Oral)   Resp 19   Ht '5\' 6"'  (1.676 m)   Wt 113.4 kg   LMP 07/26/2018   SpO2 96%   BMI 40.35 kg/m   Physical Exam  Constitutional: She appears well-developed and well-nourished. No distress.  HENT:  Head: Normocephalic and atraumatic.  Eyes: Conjunctivae are normal. No scleral icterus.  Neck: Neck supple.  Cardiovascular: Normal rate, regular rhythm, normal heart sounds and intact distal pulses.  Pulmonary/Chest: Effort normal. No stridor. No respiratory distress. She has no  wheezes. She has no rales.  Abdominal: Soft. There is negative Murphy's sign.  BS present. Epigastric TTP. No rebound, guarding or rigidity. No RUQ TTP  Musculoskeletal: She exhibits no edema.  Neurological: She is alert.  Skin: Skin is warm and dry. Capillary refill takes less than 2 seconds.  Psychiatric: She has a normal mood and affect.  Nursing note and vitals reviewed.   ED Treatments / Results  Labs (all labs ordered are listed, but only abnormal results are displayed) Labs Reviewed  CBC WITH DIFFERENTIAL/PLATELET - Abnormal; Notable for the following components:      Result Value   RBC 3.33 (*)    MCV 109.3 (*)    MCH 37.5 (*)    RDW 21.2 (*)    nRBC 0.3 (*)    Basophils Absolute 0.3 (*)    All other components within normal limits  COMPREHENSIVE METABOLIC PANEL - Abnormal; Notable for the following components:   Potassium 2.9 (*)    Glucose, Bld 108 (*)    Calcium 8.7 (*)    AST 58 (*)    ALT 47 (*)    All other components within normal limits  URINALYSIS, ROUTINE W REFLEX MICROSCOPIC - Abnormal; Notable for the following components:   APPearance HAZY (*)    All other components within normal limits  LIPASE, BLOOD  I-STAT TROPONIN, ED  I-STAT BETA HCG BLOOD, ED ([facility], [facility], [facility] ONLY)    EKG EKG Interpretation  Date/Time:  Monday August 05 2018 00:30:11 EST Ventricular Rate:  80 PR Interval:    QRS Duration: 85 QT Interval:  420 QTC Calculation: 485 R Axis:   69 Text Interpretation:  Sinus rhythm Probable anteroseptal infarct, old No significant change since last tracing Confirmed by Theotis Burrow [phone]) on 08/05/2018 1:43:18 AM   Radiology No results found.  Procedures Procedures (including critical care time)  Medications Ordered in ED Medications  sodium chloride 0.9 % bolus 1,000 mL (0 mLs Intravenous Stopped 08/05/18 0047)  ondansetron (ZOFRAN) injection 4 mg (4 mg Intravenous Given 08/05/18 0015)  alum & mag hydroxide-simeth (MAALOX/MYLANTA)  200-200-20 MG/5ML suspension 30 mL (30 mLs Oral Given 08/05/18 0028)  famotidine (PEPCID) tablet 20 mg (20 mg Oral Given 08/05/18 0028)  morphine 4 MG/ML injection 4 mg (4 mg Intravenous Given 08/05/18 0152)  potassium chloride SA (K-DUR,KLOR-CON) CR tablet 40 mEq (40 mEq Oral Given 08/05/18 0243)  potassium chloride 10 mEq in 100 mL IVPB (0 mEq Intravenous Stopped 08/05/18 0503)  promethazine (PHENERGAN) tablet 12.5 mg (12.5 mg Oral Given 08/05/18 0339)     Initial Impression / Assessment and Plan / ED Course  I have reviewed the triage vital signs and the nursing notes.  Pertinent labs & imaging results that were available  during my care of the patient were reviewed by me and considered in my medical decision making (see chart for details).    Final Clinical Impressions(s) / ED Diagnoses   Final diagnoses:  Acute gastritis without hemorrhage, unspecified gastritis type  Elevated liver enzymes  Hypokalemia   Patient presenting with epigastric  Abdominal pain associated with nausea and vomiting.  Reports recent EtOH use.  Has history of PUD.  Is not currently on PPI.  No fevers. hypertensive but otherwise vital signs stable.  Doubt hypertensive emergency.  Has some epigastric tenderness on exam, no rebound rigidity or guarding.  No right upper quadrant tenderness.  CBC with no leukocytosis or anemia.  CMP with hypokalemia at 2.9, this was supplemented in the ED with IV and p.o. potassium.  Also given short Rx for potassium supplementation at home.  AST/ALT slightly elevated at 58/47 which is of unclear etiology though could be related to her recent etoh use. Alk phos and tbili are normal. Lipase, trop, and beta hcg WNL. UA without evidence of UTI. EKG with NSR, hr 80, no acute ischemic changes, no change from previous.   Pt given antiemetics, h2blocker, analgesics, and maalox with improvement of sxs. She was able to tolerate po. Abdominal exam is nonsurgical and suspect sxs are secondary  to gastritis caused by recent etoh use. Will give pt rx for protonix and have pt f/u with her gastroenterologist. Advised her to return to the ED for new or worsening sxs.   ED Discharge Orders         Ordered    potassium chloride (K-DUR) 10 MEQ tablet  Daily     08/05/18 0447    pantoprazole (PROTONIX) 20 MG tablet  Daily     08/05/18 0447           Rodney Booze, PA-C 08/06/18 0741    Little, Wenda Overland, MD 08/08/18 2107

## 2018-08-05 LAB — CBC WITH DIFFERENTIAL/PLATELET
Basophils Absolute: 0.3 10*3/uL — ABNORMAL HIGH (ref 0.0–0.1)
Basophils Relative: 4 %
EOS PCT: 3 %
Eosinophils Absolute: 0.2 10*3/uL (ref 0.0–0.5)
HCT: 36.4 % (ref 36.0–46.0)
HEMOGLOBIN: 12.5 g/dL (ref 12.0–15.0)
LYMPHS ABS: 2.5 10*3/uL (ref 0.7–4.0)
Lymphocytes Relative: 32 %
MCH: 37.5 pg — AB (ref 26.0–34.0)
MCHC: 34.3 g/dL (ref 30.0–36.0)
MCV: 109.3 fL — AB (ref 80.0–100.0)
MONO ABS: 0.4 10*3/uL (ref 0.1–1.0)
MONOS PCT: 5 %
Neutro Abs: 4.3 10*3/uL (ref 1.7–7.7)
Neutrophils Relative %: 56 %
Platelets: 335 10*3/uL (ref 150–400)
RBC: 3.33 MIL/uL — ABNORMAL LOW (ref 3.87–5.11)
RDW: 21.2 % — ABNORMAL HIGH (ref 11.5–15.5)
WBC: 7.7 10*3/uL (ref 4.0–10.5)
nRBC: 0.3 % — ABNORMAL HIGH (ref 0.0–0.2)

## 2018-08-05 LAB — COMPREHENSIVE METABOLIC PANEL
ALK PHOS: 111 U/L (ref 38–126)
ALT: 47 U/L — AB (ref 0–44)
AST: 58 U/L — ABNORMAL HIGH (ref 15–41)
Albumin: 3.7 g/dL (ref 3.5–5.0)
Anion gap: 8 (ref 5–15)
BUN: 9 mg/dL (ref 6–20)
CALCIUM: 8.7 mg/dL — AB (ref 8.9–10.3)
CO2: 29 mmol/L (ref 22–32)
CREATININE: 0.76 mg/dL (ref 0.44–1.00)
Chloride: 102 mmol/L (ref 98–111)
GFR calc Af Amer: >60 mL/min (ref >60)
Glucose, Bld: 108 mg/dL — ABNORMAL HIGH (ref 70–99)
Potassium: 2.9 mmol/L — ABNORMAL LOW (ref 3.5–5.1)
Sodium: 139 mmol/L (ref 135–145)
Total Bilirubin: 1.2 mg/dL (ref 0.3–1.2)
Total Protein: 6.9 g/dL (ref 6.5–8.1)

## 2018-08-05 LAB — URINALYSIS, ROUTINE W REFLEX MICROSCOPIC
Bilirubin Urine: NEGATIVE
GLUCOSE, UA: NEGATIVE mg/dL
Hgb urine dipstick: NEGATIVE
KETONES UR: NEGATIVE mg/dL
LEUKOCYTES UA: NEGATIVE
Nitrite: NEGATIVE
PH: 6 (ref 5.0–8.0)
Protein, ur: NEGATIVE mg/dL
SPECIFIC GRAVITY, URINE: 1.015 (ref 1.005–1.030)

## 2018-08-05 LAB — LIPASE, BLOOD: LIPASE: 27 U/L (ref 11–51)

## 2018-08-05 LAB — I-STAT TROPONIN, ED: TROPONIN I, POC: 0 ng/mL (ref 0.00–0.08)

## 2018-08-05 LAB — I-STAT BETA HCG BLOOD, ED (MC, WL, AP ONLY): I-stat hCG, quantitative: <5 m[IU]/mL (ref <5)

## 2018-08-05 MED ORDER — PROMETHAZINE HCL 25 MG PO TABS
12.5000 mg | ORAL_TABLET | Freq: Once | ORAL | Status: AC
  Administered 2018-08-05: 12.5 mg via ORAL
  Filled 2018-08-05: qty 1

## 2018-08-05 MED ORDER — PANTOPRAZOLE SODIUM 20 MG PO TBEC: 20.0000 mg | DELAYED_RELEASE_TABLET | Freq: Every day | ORAL | 0 refills | Status: DC

## 2018-08-05 MED ORDER — ALUM & MAG HYDROXIDE-SIMETH 200-200-20 MG/5ML PO SUSP
30.0000 mL | Freq: Once | ORAL | Status: AC
  Administered 2018-08-05: 30 mL via ORAL
  Filled 2018-08-05: qty 30

## 2018-08-05 MED ORDER — POTASSIUM CHLORIDE ER 10 MEQ PO TBCR: 10.0000 meq | EXTENDED_RELEASE_TABLET | Freq: Every day | ORAL | 0 refills | Status: DC

## 2018-08-05 MED ORDER — MORPHINE SULFATE (PF) 4 MG/ML IV SOLN
4.0000 mg | Freq: Once | INTRAVENOUS | Status: AC
  Administered 2018-08-05: 4 mg via INTRAVENOUS
  Filled 2018-08-05: qty 1

## 2018-08-05 MED ORDER — POTASSIUM CHLORIDE 10 MEQ/100ML IV SOLN
10.0000 meq | INTRAVENOUS | Status: AC
  Administered 2018-08-05 (×2): 10 meq via INTRAVENOUS
  Filled 2018-08-05 (×2): qty 100

## 2018-08-05 MED ORDER — POTASSIUM CHLORIDE CRYS ER 20 MEQ PO TBCR
40.0000 meq | EXTENDED_RELEASE_TABLET | Freq: Once | ORAL | Status: AC
  Administered 2018-08-05: 40 meq via ORAL
  Filled 2018-08-05: qty 2

## 2018-08-05 MED ORDER — FAMOTIDINE 20 MG PO TABS
20.0000 mg | ORAL_TABLET | Freq: Once | ORAL | Status: AC
  Administered 2018-08-05: 20 mg via ORAL
  Filled 2018-08-05: qty 1

## 2018-08-05 NOTE — ED Notes (Signed)
Patient verbalizes understanding of discharge instructions. Opportunity for questioning and answers were provided. Armband removed by staff, pt discharged from ED ambulatory.   

## 2018-08-05 NOTE — Discharge Instructions (Addendum)
Please take the medication as directed on your discharge paperwork. Please avoid drinking alcohol or taking NSAIDs like ibuprofen, aleve, naproxen, or aspirin.  Please follow up with your gastroenterologist and your regular doctor in regards to your visit in the emergency room today.   Please return to the ER sooner if you have any new or worsening symptoms, or if you have any of the following symptoms:  Abdominal pain that does not go away.  You have a fever.  You keep throwing up (vomiting).  The pain is felt only in portions of the abdomen. Pain in the right side could possibly be appendicitis. In an adult, pain in the left lower portion of the abdomen could be colitis or diverticulitis.  You pass bloody or black tarry stools.  There is bright red blood in the stool.  The constipation stays for more than 4 days.  There is belly (abdominal) or rectal pain.  You do not seem to be getting better.  You have any questions or concerns.

## 2018-09-23 ENCOUNTER — Ambulatory Visit (HOSPITAL_COMMUNITY)
Admission: EM | Admit: 2018-09-23 | Discharge: 2018-09-23 | Disposition: A | Discharge status: Home / Self Care | Payer: 59 | Attending: Nurse Practitioner | Admitting: Nurse Practitioner

## 2018-09-23 ENCOUNTER — Encounter (HOSPITAL_COMMUNITY): Payer: Self-pay

## 2018-09-23 DIAGNOSIS — R202 Paresthesia of skin: Secondary | ICD-10-CM | POA: Insufficient documentation

## 2018-09-23 DIAGNOSIS — R200 Anesthesia of skin: Secondary | ICD-10-CM | POA: Diagnosis not present

## 2018-09-23 MED ORDER — GABAPENTIN 300 MG PO CAPS: 300.0000 mg | ORAL_CAPSULE | Freq: Every day | ORAL | 0 refills | Status: DC

## 2018-09-23 NOTE — ED Provider Notes (Signed)
[facility]    CSN: [id number] Arrival date & time: 09/23/18  [id number]     History   Chief Complaint Chief Complaint  Patient presents with  . Foot Pain    Numbness/tingling     HPI [name] is a 42 y.o. female.   Subjective:  [name] is a 42 y.o. female who presents with numbness and tingling to both feet. Onset of the symptoms was sudden, not related to any specific activity. Her symptoms is constant and occurs multiple times per day. She denies any decreased range of motion, pain, redness, stiffness or weakness. No calf pain/swelling. No recent travel.  H no palpitations, chest pain or shortness of breath. No prior history of similar symptoms. Symptoms have waxed and waned. Patient has had prior leg problems. Evaluation to date: none. Treatment to date: OTC analgesics which are ineffective and rest. Past musculoskeletal history: negative for previous injuries or other musculoskeletal conditions.  The following portions of the patient's history were reviewed and updated as appropriate: allergies, current medications, past family history, past medical history, past social history, past surgical history and problem list.       Past Medical History:  Diagnosis Date  . Acid reflux   . Anemia   . Ulcer     Patient Active Problem List   Diagnosis Date Noted  . Enlarged lymph nodes in armpit 08/01/2012    Past Surgical History:  Procedure Laterality Date  . ABDOMINAL SURGERY    . BREAST BIOPSY Right 2013  . COSMETIC SURGERY     tummy tuck  . HERNIA REPAIR    . ROUX-EN-Y PROCEDURE    . TUMOR REMOVAL    . WRIST GANGLION EXCISION      OB History   No obstetric history on file.      Home Medications    Prior to Admission medications   Medication Sig Start Date End Date Taking? Authorizing Provider  gabapentin (NEURONTIN) 300 MG capsule Take 1 capsule (300 mg total) by mouth at bedtime. 09/23/18 10/23/18  Lurline IdolMurrill, Jeorge Reister, FNP    pantoprazole (PROTONIX) 20 MG tablet Take 1 tablet (20 mg total) by mouth daily for 14 days. 08/05/18 08/19/18  Couture, Cortni S, PA-C    Family History Family History  Problem Relation Age of Onset  . Cancer Mother        kidney  . Cancer Paternal Aunt     Social History Social History   Tobacco Use  . Smoking status: Current Some Day Smoker    Types: Cigarettes  . Smokeless tobacco: Never Used  Substance Use Topics  . Alcohol use: Yes    Comment: occasionally  . Drug use: No     Allergies   Patient has no known allergies.   Review of Systems Review of Systems  Respiratory: Negative for cough and shortness of breath.   Cardiovascular: Negative for chest pain, palpitations and leg swelling.  Musculoskeletal: Negative for gait problem.  Neurological: Positive for numbness. Negative for dizziness, seizures, facial asymmetry, speech difficulty, weakness and light-headedness.  All other systems reviewed and are negative.    Physical Exam Triage Vital Signs ED Triage Vitals  Enc Vitals Group     BP 09/23/18 1001 (!) 149/102     Pulse Rate 09/23/18 1001 (!) 101     Resp 09/23/18 1001 16     Temp 09/23/18 1001 98.2 F (36.8 C)     Temp Source 09/23/18 1001 Oral  SpO2 09/23/18 1001 100 %     Weight --      Height --      Head Circumference --      Peak Flow --      Pain Score 09/23/18 1002 0     Pain Loc --      Pain Edu? --      Excl. in [place]? --    No data found.  Updated Vital Signs BP (!) 149/102 (BP Location: Right Arm)   Pulse (!) 101   Temp 98.2 F (36.8 C) (Oral)   Resp 16   LMP 09/16/2018   SpO2 100%   Visual Acuity Right Eye Distance:   Left Eye Distance:   Bilateral Distance:    Right Eye Near:   Left Eye Near:    Bilateral Near:     Physical Exam Constitutional:      General: She is not in acute distress.    Appearance: She is not ill-appearing, toxic-appearing or diaphoretic.  HENT:     Head: Normocephalic.  Neck:      Musculoskeletal: Normal range of motion and neck supple.  Cardiovascular:     Rate and Rhythm: Normal rate and regular rhythm.     Pulses: Normal pulses.     Heart sounds: Normal heart sounds.  Pulmonary:     Effort: Pulmonary effort is normal.     Breath sounds: Normal breath sounds.  Musculoskeletal: Normal range of motion.  Skin:    General: Skin is warm and dry.  Neurological:     General: No focal deficit present.     Mental Status: She is alert and oriented to person, place, and time.     Cranial Nerves: Cranial nerves are intact. No cranial nerve deficit.     Motor: Motor function is intact.     Coordination: Coordination is intact.     Gait: Gait is intact.     Comments: Decreased sensation to dull and sharp to bilateral lower extremities  Color, strength and temperature normal.   Psychiatric:        Mood and Affect: Mood normal.        Behavior: Behavior normal.      UC Treatments / Results  Labs (all labs ordered are listed, but only abnormal results are displayed) Labs Reviewed - No data to display  EKG None  Radiology No results found.  Procedures Procedures (including critical care time)  Medications Ordered in UC Medications - No data to display  Initial Impression / Assessment and Plan / UC Course  I have reviewed the triage vital signs and the nursing notes.  Pertinent labs & imaging results that were available during my care of the patient were reviewed by me and considered in my medical decision making (see chart for details).     42 year old female presenting with a 3-day history of numbness and tingling to both of her feet. Unclear etiology at this time.  Patient is alert and oriented x3.  No focal neuro deficits noted.  Low suspicion for DVT, cellulitis, CVA/TIA.  Symptoms consistent with neuropathy.  Will try course of gabapentin.  Encouraged patient to follow-up with PCP within the next week or so to determine if prescribed therapy is helpful  with her symptoms.  Discussed indications for immediate ED follow-up.  Patient agreeable.   Today's evaluation has revealed no signs of a dangerous process. Discussed diagnosis with patient. Patient aware of their diagnosis, possible red flag symptoms to watch out for  and need for close follow up. Patient understands verbal and written discharge instructions. Patient comfortable with plan and disposition.  Patient has a clear mental status at this time, good insight into illness (after discussion and teaching) and has clear judgment to make decisions regarding their care.  Documentation was completed with the aid of voice recognition software. Transcription may contain typographical errors. Final Clinical Impressions(s) / UC Diagnoses   Final diagnoses:  Numbness and tingling of foot     Discharge Instructions     Take medications as prescribed. Follow-up with your primary care provider in one week.     ED Prescriptions    Medication Sig Dispense Auth. Provider   gabapentin (NEURONTIN) 300 MG capsule Take 1 capsule (300 mg total) by mouth at bedtime. 30 capsule Lurline Idol, FNP     Controlled Substance Prescriptions [place] Controlled Substance Registry consulted? Not Applicable   Lurline Idol, FNP 09/23/18 1055

## 2018-09-23 NOTE — ED Triage Notes (Signed)
Pt present right foot is numbness and tingling that started 3 days ago.

## 2018-09-23 NOTE — Discharge Instructions (Signed)
Take medications as prescribed. Follow-up with your primary care provider in one week.

## 2019-06-17 ENCOUNTER — Other Ambulatory Visit: Payer: Self-pay | Admitting: Obstetrics and Gynecology

## 2019-06-17 DIAGNOSIS — Z1231 Encounter for screening mammogram for malignant neoplasm of breast: Secondary | ICD-10-CM

## 2019-07-26 ENCOUNTER — Encounter (HOSPITAL_COMMUNITY): Payer: Self-pay | Admitting: Emergency Medicine

## 2019-07-26 ENCOUNTER — Ambulatory Visit (INDEPENDENT_AMBULATORY_CARE_PROVIDER_SITE_OTHER): Payer: 59

## 2019-07-26 ENCOUNTER — Ambulatory Visit (HOSPITAL_COMMUNITY)
Admission: EM | Admit: 2019-07-26 | Discharge: 2019-07-26 | Disposition: A | Discharge status: Home / Self Care | Payer: 59 | Attending: Internal Medicine | Admitting: Internal Medicine

## 2019-07-26 DIAGNOSIS — M25531 Pain in right wrist: Secondary | ICD-10-CM

## 2019-07-26 MED ORDER — PREDNISONE 20 MG PO TABS: 20.0000 mg | ORAL_TABLET | Freq: Every day | ORAL | 0 refills | Status: AC

## 2019-07-26 NOTE — ED Provider Notes (Signed)
[facility]    CSN: [id number] Arrival date & time: 07/26/19  1513      History   Chief Complaint Chief Complaint  Patient presents with  . Wrist Pain    HPI [name] is a 43 y.o. female with history of gastroesophageal reflux disease, hypertension comes to urgent care with complaints of right wrist pain off a few days duration.  Symptoms started on Thursday and has been persistent.  Pain is sharp and off moderate severity.  Pain is currently 8 out of 10.  Patient denies any trauma.  She has tried ibuprofen with no relief.  Pain is aggravated by movement.  She denies any numbness/tingling or weakness in the hands.Marland Kitchen   HPI  Past Medical History:  Diagnosis Date  . Acid reflux   . Anemia   . Ulcer     Patient Active Problem List   Diagnosis Date Noted  . Enlarged lymph nodes in armpit 08/01/2012    Past Surgical History:  Procedure Laterality Date  . ABDOMINAL SURGERY    . BREAST BIOPSY Right 2013  . COSMETIC SURGERY     tummy tuck  . HERNIA REPAIR    . ROUX-EN-Y PROCEDURE    . TUMOR REMOVAL    . WRIST GANGLION EXCISION      OB History   No obstetric history on file.      Home Medications    Prior to Admission medications   Medication Sig Start Date End Date Taking? Authorizing Provider  gabapentin (NEURONTIN) 300 MG capsule Take 1 capsule (300 mg total) by mouth at bedtime. 09/23/18 10/23/18  Lurline Idol, FNP  hydrochlorothiazide (HYDRODIURIL) 12.5 MG tablet Take 12.5 mg by mouth daily. 03/12/19   [provider]  pantoprazole (PROTONIX) 20 MG tablet Take 1 tablet (20 mg total) by mouth daily for 14 days. 08/05/18 08/19/18  Couture, Cortni S, PA-C  predniSONE (DELTASONE) 20 MG tablet Take 1 tablet (20 mg total) by mouth daily for 3 days. 07/26/19 07/29/19  Merrilee Jansky, MD    Family History Family History  Problem Relation Age of Onset  . Cancer Mother        kidney  . Cancer Paternal Aunt     Social History  Social History   Tobacco Use  . Smoking status: Current Some Day Smoker    Types: Cigarettes  . Smokeless tobacco: Never Used  Substance Use Topics  . Alcohol use: Yes    Comment: occasionally  . Drug use: No     Allergies   Patient has no known allergies.   Review of Systems Review of Systems  Constitutional: Negative.   HENT: Negative.   Respiratory: Negative.  Negative for cough, chest tightness and wheezing.   Cardiovascular: Negative.   Gastrointestinal: Negative.   Genitourinary: Negative.   Musculoskeletal: Positive for arthralgias. Negative for back pain, joint swelling, myalgias and neck pain.  Skin: Negative.   Neurological: Negative.      Physical Exam Triage Vital Signs ED Triage Vitals  Enc Vitals Group     BP      Pulse      Resp      Temp      Temp src      SpO2      Weight      Height      Head Circumference      Peak Flow      Pain Score      Pain Loc  Pain Edu?      Excl. in [place]?    No data found.  Updated Vital Signs BP (!) 152/94   Pulse 99   Temp 99 F (37.2 C)   Resp 18   LMP 07/16/2019   SpO2 99%   Visual Acuity Right Eye Distance:   Left Eye Distance:   Bilateral Distance:    Right Eye Near:   Left Eye Near:    Bilateral Near:     Physical Exam Vitals signs and nursing note reviewed.  Constitutional:      General: She is in acute distress.  Cardiovascular:     Rate and Rhythm: Normal rate and regular rhythm.  Pulmonary:     Effort: Pulmonary effort is normal.     Breath sounds: Normal breath sounds.  Abdominal:     General: Bowel sounds are normal.  Musculoskeletal:     Comments: No swelling over the right wrist.  Tenderness to palpation.  Decreased range of motion around the right wrist.  Grip strength is normal.  No hypothenar or thenar muscle atrophy.  No bruising.  Skin:    General: Skin is warm.     Capillary Refill: Capillary refill takes less than 2 seconds.     Findings: No bruising or erythema.   Neurological:     General: No focal deficit present.     Mental Status: She is alert and oriented to person, place, and time.      UC Treatments / Results  Labs (all labs ordered are listed, but only abnormal results are displayed) Labs Reviewed - No data to display  EKG   Radiology Dg Wrist Complete Right  Result Date: 07/26/2019 CLINICAL DATA:  Wrist pain without trauma. EXAM: RIGHT WRIST - COMPLETE 3+ VIEW COMPARISON:  None. FINDINGS: There is no evidence of fracture or dislocation. There is no evidence of arthropathy or other focal bone abnormality. Soft tissues are unremarkable. IMPRESSION: Negative. Electronically Signed   By: Dorise Bullion III M.D   On: 07/26/2019 16:22    Procedures Procedures (including critical care time)  Medications Ordered in UC Medications - No data to display  Initial Impression / Assessment and Plan / UC Course  I have reviewed the triage vital signs and the nursing notes.  Pertinent labs & imaging results that were available during my care of the patient were reviewed by me and considered in my medical decision making (see chart for details).     1.  Right wrist sprain: X-ray of the right wrist is negative for acute fracture Prednisone 20 mg orally daily x3 days Wrist splint at bedtime Gentle range of motion exercises Continue Motrin as needed for pain Return to urgent care if pain does not improve over the next 48 to 72 hours. Final Clinical Impressions(s) / UC Diagnoses   Final diagnoses:  Right wrist pain   Discharge Instructions   None    ED Prescriptions    Medication Sig Dispense Auth. Provider   predniSONE (DELTASONE) 20 MG tablet Take 1 tablet (20 mg total) by mouth daily for 3 days. 3 tablet Dashonda Bonneau, Myrene Galas, MD     PDMP not reviewed this encounter.   Chase Picket, MD 07/26/19 [phone]

## 2019-07-26 NOTE — ED Triage Notes (Signed)
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

## 2019-08-01 ENCOUNTER — Ambulatory Visit
Admission: RE | Admit: 2019-08-01 | Discharge: 2019-08-01 | Disposition: A | Discharge status: Home / Self Care | Payer: 59 | Source: Ambulatory Visit | Attending: Obstetrics and Gynecology | Admitting: Obstetrics and Gynecology

## 2019-08-01 ENCOUNTER — Other Ambulatory Visit: Payer: Self-pay

## 2019-08-01 DIAGNOSIS — Z1231 Encounter for screening mammogram for malignant neoplasm of breast: Secondary | ICD-10-CM

## 2019-08-18 ENCOUNTER — Other Ambulatory Visit: Payer: Self-pay | Admitting: Ophthalmology

## 2019-11-22 ENCOUNTER — Emergency Department (HOSPITAL_COMMUNITY): Payer: No Typology Code available for payment source

## 2019-11-22 ENCOUNTER — Inpatient Hospital Stay (HOSPITAL_COMMUNITY)
Admission: EM | Admit: 2019-11-22 | Discharge: 2019-11-24 | DRG: 419 | Disposition: A | Discharge status: Home / Self Care | Payer: No Typology Code available for payment source | Attending: Physician Assistant | Admitting: Physician Assistant

## 2019-11-22 ENCOUNTER — Encounter (HOSPITAL_COMMUNITY): Payer: Self-pay | Admitting: Emergency Medicine

## 2019-11-22 ENCOUNTER — Other Ambulatory Visit: Payer: Self-pay

## 2019-11-22 DIAGNOSIS — R1011 Right upper quadrant pain: Secondary | ICD-10-CM | POA: Diagnosis not present

## 2019-11-22 DIAGNOSIS — Z9884 Bariatric surgery status: Secondary | ICD-10-CM

## 2019-11-22 DIAGNOSIS — K219 Gastro-esophageal reflux disease without esophagitis: Secondary | ICD-10-CM | POA: Diagnosis present

## 2019-11-22 DIAGNOSIS — R1114 Bilious vomiting: Secondary | ICD-10-CM

## 2019-11-22 DIAGNOSIS — K8000 Calculus of gallbladder with acute cholecystitis without obstruction: Principal | ICD-10-CM

## 2019-11-22 DIAGNOSIS — Z79899 Other long term (current) drug therapy: Secondary | ICD-10-CM

## 2019-11-22 DIAGNOSIS — K8020 Calculus of gallbladder without cholecystitis without obstruction: Secondary | ICD-10-CM

## 2019-11-22 DIAGNOSIS — Z23 Encounter for immunization: Secondary | ICD-10-CM

## 2019-11-22 DIAGNOSIS — Z20822 Contact with and (suspected) exposure to covid-19: Secondary | ICD-10-CM | POA: Diagnosis present

## 2019-11-22 DIAGNOSIS — Z419 Encounter for procedure for purposes other than remedying health state, unspecified: Secondary | ICD-10-CM

## 2019-11-22 DIAGNOSIS — F17210 Nicotine dependence, cigarettes, uncomplicated: Secondary | ICD-10-CM | POA: Diagnosis present

## 2019-11-22 DIAGNOSIS — Z888 Allergy status to other drugs, medicaments and biological substances status: Secondary | ICD-10-CM

## 2019-11-22 DIAGNOSIS — R160 Hepatomegaly, not elsewhere classified: Secondary | ICD-10-CM | POA: Diagnosis present

## 2019-11-22 DIAGNOSIS — Z8711 Personal history of peptic ulcer disease: Secondary | ICD-10-CM

## 2019-11-22 LAB — BASIC METABOLIC PANEL
Anion gap: 16 — ABNORMAL HIGH (ref 5–15)
BUN: 5 mg/dL — ABNORMAL LOW (ref 6–20)
CO2: 19 mmol/L — ABNORMAL LOW (ref 22–32)
Calcium: 8.8 mg/dL — ABNORMAL LOW (ref 8.9–10.3)
Chloride: 103 mmol/L (ref 98–111)
Creatinine, Ser: 0.68 mg/dL (ref 0.44–1.00)
GFR calc Af Amer: >60 mL/min (ref >60)
GFR calc non Af Amer: >60 mL/min (ref >60)
Glucose, Bld: 113 mg/dL — ABNORMAL HIGH (ref 70–99)
Potassium: 3.1 mmol/L — ABNORMAL LOW (ref 3.5–5.1)
Sodium: 138 mmol/L (ref 135–145)

## 2019-11-22 LAB — CBC
HCT: 39 % (ref 36.0–46.0)
Hemoglobin: 12.5 g/dL (ref 12.0–15.0)
MCH: 31.2 pg (ref 26.0–34.0)
MCHC: 32.1 g/dL (ref 30.0–36.0)
MCV: 97.3 fL (ref 80.0–100.0)
Platelets: 276 K/uL (ref 150–400)
RBC: 4.01 MIL/uL (ref 3.87–5.11)
RDW: 17.6 % — ABNORMAL HIGH (ref 11.5–15.5)
WBC: 3.1 K/uL — ABNORMAL LOW (ref 4.0–10.5)
nRBC: 0 % (ref 0.0–0.2)

## 2019-11-22 LAB — HEPATIC FUNCTION PANEL
ALT: 80 U/L — ABNORMAL HIGH (ref 0–44)
AST: 307 U/L — ABNORMAL HIGH (ref 15–41)
Albumin: 3.5 g/dL (ref 3.5–5.0)
Alkaline Phosphatase: 142 U/L — ABNORMAL HIGH (ref 38–126)
Bilirubin, Direct: 0.7 mg/dL — ABNORMAL HIGH (ref 0.0–0.2)
Indirect Bilirubin: 0.7 mg/dL (ref 0.3–0.9)
Total Bilirubin: 1.4 mg/dL — ABNORMAL HIGH (ref 0.3–1.2)
Total Protein: 6.9 g/dL (ref 6.5–8.1)

## 2019-11-22 LAB — LIPASE, BLOOD: Lipase: 28 U/L (ref 11–51)

## 2019-11-22 LAB — I-STAT BETA HCG BLOOD, ED (MC, WL, AP ONLY): I-stat hCG, quantitative: <5 m[IU]/mL

## 2019-11-22 LAB — SURGICAL PCR SCREEN
MRSA, PCR: NEGATIVE
Staphylococcus aureus: POSITIVE — AB

## 2019-11-22 LAB — POC SARS CORONAVIRUS 2 AG -  ED: SARS Coronavirus 2 Ag: NEGATIVE

## 2019-11-22 LAB — TROPONIN I (HIGH SENSITIVITY)
Troponin I (High Sensitivity): 6 ng/L (ref <18)
Troponin I (High Sensitivity): 8 ng/L (ref <18)

## 2019-11-22 MED ORDER — SODIUM CHLORIDE 0.9 % IV BOLUS
1000.0000 mL | Freq: Once | INTRAVENOUS | Status: AC
  Administered 2019-11-22: 1000 mL via INTRAVENOUS

## 2019-11-22 MED ORDER — ONDANSETRON 4 MG PO TBDP: 4.0000 mg | ORAL_TABLET | Freq: Four times a day (QID) | ORAL | Status: DC | PRN

## 2019-11-22 MED ORDER — ACETAMINOPHEN 325 MG PO TABS
650.0000 mg | ORAL_TABLET | Freq: Four times a day (QID) | ORAL | Status: DC | PRN
  Administered 2019-11-22: 650 mg via ORAL
  Filled 2019-11-22: qty 2

## 2019-11-22 MED ORDER — GABAPENTIN 300 MG PO CAPS
300.0000 mg | ORAL_CAPSULE | Freq: Every day | ORAL | Status: DC
  Administered 2019-11-22 – 2019-11-23 (×2): 300 mg via ORAL
  Filled 2019-11-22 (×2): qty 1

## 2019-11-22 MED ORDER — POTASSIUM CHLORIDE IN NACL 40-0.9 MEQ/L-% IV SOLN
INTRAVENOUS | Status: DC
  Administered 2019-11-22 – 2019-11-24 (×3): 100 mL/h via INTRAVENOUS
  Filled 2019-11-22 (×3): qty 1000

## 2019-11-22 MED ORDER — HYDROMORPHONE HCL 1 MG/ML IJ SOLN
1.0000 mg | INTRAMUSCULAR | Status: DC | PRN
  Administered 2019-11-22 – 2019-11-24 (×3): 1 mg via INTRAVENOUS
  Filled 2019-11-22 (×3): qty 1

## 2019-11-22 MED ORDER — ONDANSETRON HCL 4 MG/2ML IJ SOLN
4.0000 mg | Freq: Once | INTRAMUSCULAR | Status: AC
  Administered 2019-11-22: 4 mg via INTRAVENOUS
  Filled 2019-11-22: qty 2

## 2019-11-22 MED ORDER — FENTANYL CITRATE (PF) 100 MCG/2ML IJ SOLN
50.0000 ug | Freq: Once | INTRAMUSCULAR | Status: AC
  Administered 2019-11-22: 50 ug via INTRAVENOUS
  Filled 2019-11-22: qty 2

## 2019-11-22 MED ORDER — SODIUM CHLORIDE 0.9% FLUSH
3.0000 mL | Freq: Once | INTRAVENOUS | Status: AC
  Administered 2019-11-22: 3 mL via INTRAVENOUS

## 2019-11-22 MED ORDER — DIPHENHYDRAMINE HCL 25 MG PO CAPS: 25.0000 mg | ORAL_CAPSULE | Freq: Four times a day (QID) | ORAL | Status: DC | PRN

## 2019-11-22 MED ORDER — SODIUM CHLORIDE 0.9 % IV SOLN
2.0000 g | INTRAVENOUS | Status: DC
  Administered 2019-11-22: 2 g via INTRAVENOUS
  Filled 2019-11-22: qty 2
  Filled 2019-11-22: qty 20

## 2019-11-22 MED ORDER — HYDROCHLOROTHIAZIDE 25 MG PO TABS
12.5000 mg | ORAL_TABLET | Freq: Every day | ORAL | Status: DC
  Administered 2019-11-24: 12.5 mg via ORAL
  Filled 2019-11-22: qty 1

## 2019-11-22 MED ORDER — DIPHENHYDRAMINE HCL 50 MG/ML IJ SOLN: 25.0000 mg | Freq: Four times a day (QID) | INTRAMUSCULAR | Status: DC | PRN

## 2019-11-22 MED ORDER — PANTOPRAZOLE SODIUM 40 MG IV SOLR
40.0000 mg | Freq: Once | INTRAVENOUS | Status: AC
  Administered 2019-11-22: 40 mg via INTRAVENOUS
  Filled 2019-11-22: qty 40

## 2019-11-22 MED ORDER — PANTOPRAZOLE SODIUM 40 MG IV SOLR
40.0000 mg | Freq: Every day | INTRAVENOUS | Status: DC
  Administered 2019-11-22 – 2019-11-23 (×2): 40 mg via INTRAVENOUS
  Filled 2019-11-22 (×2): qty 40

## 2019-11-22 MED ORDER — PROMETHAZINE HCL 25 MG/ML IJ SOLN
25.0000 mg | Freq: Once | INTRAMUSCULAR | Status: AC
  Administered 2019-11-22: 25 mg via INTRAVENOUS
  Filled 2019-11-22: qty 1

## 2019-11-22 MED ORDER — ACETAMINOPHEN 650 MG RE SUPP: 650.0000 mg | Freq: Four times a day (QID) | RECTAL | Status: DC | PRN

## 2019-11-22 MED ORDER — PROCHLORPERAZINE EDISYLATE 10 MG/2ML IJ SOLN
5.0000 mg | Freq: Four times a day (QID) | INTRAMUSCULAR | Status: DC | PRN
  Administered 2019-11-22: 10 mg via INTRAVENOUS
  Filled 2019-11-22: qty 2

## 2019-11-22 MED ORDER — SODIUM CHLORIDE 0.9 % IV BOLUS
500.0000 mL | Freq: Once | INTRAVENOUS | Status: AC
  Administered 2019-11-22: 500 mL via INTRAVENOUS

## 2019-11-22 MED ORDER — ONDANSETRON HCL 4 MG/2ML IJ SOLN: 4.0000 mg | Freq: Four times a day (QID) | INTRAMUSCULAR | Status: DC | PRN

## 2019-11-22 MED ORDER — ENOXAPARIN SODIUM 40 MG/0.4ML ~~LOC~~ SOLN
40.0000 mg | SUBCUTANEOUS | Status: DC
  Administered 2019-11-22 – 2019-11-24 (×2): 40 mg via SUBCUTANEOUS
  Filled 2019-11-22 (×2): qty 0.4

## 2019-11-22 MED ORDER — MORPHINE SULFATE (PF) 4 MG/ML IV SOLN: 4.0000 mg | Freq: Once | INTRAVENOUS | Status: DC

## 2019-11-22 MED ORDER — PROCHLORPERAZINE MALEATE 10 MG PO TABS
10.0000 mg | ORAL_TABLET | Freq: Four times a day (QID) | ORAL | Status: DC | PRN
  Filled 2019-11-22: qty 1

## 2019-11-22 NOTE — ED Triage Notes (Signed)
Pt. Stated, Im having CP and SOB that started this morning while I was eating.

## 2019-11-22 NOTE — ED Provider Notes (Signed)
[facility] EMERGENCY DEPARTMENT Provider Note   CSN: [id number] Arrival date & time: 11/22/19  1223     History Chief Complaint  Patient presents with  . Chest Pain  . Shortness of Breath    [name] is a 44 y.o. female.  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  She said she had subxiphoid and right upper quadrant pain that started this morning.  Radiates through to the back.  Worse with deep breath.  Associated with nausea no vomiting.  No diarrhea or constipation.  No fevers or chills.  Tried over-the-counter acid medication without improvement.  Does smoke and used alcohol last night.  The history is provided by the patient.  Abdominal Pain Pain location:  Epigastric and RUQ Pain quality: aching and stabbing   Pain radiates to:  Back Pain severity:  Severe Onset quality:  Gradual Timing:  Constant Progression:  Unchanged Chronicity:  New Context: alcohol use   Context: not trauma   Relieved by:  Nothing Worsened by:  Deep breathing Ineffective treatments:  Antacids Associated symptoms: chest pain, nausea and shortness of breath   Associated symptoms: no constipation, no cough, no diarrhea, no dysuria, no fever, no hematemesis, no hematochezia, no hematuria, no sore throat and no vomiting        Past Medical History:  Diagnosis Date  . Acid reflux   . Anemia   . Ulcer     Patient Active Problem List   Diagnosis Date Noted  . Enlarged lymph nodes in armpit 08/01/2012    Past Surgical History:  Procedure Laterality Date  . ABDOMINAL SURGERY    . BREAST BIOPSY Right 2013  . COSMETIC SURGERY     tummy tuck  . HERNIA REPAIR    . ROUX-EN-Y PROCEDURE    . TUMOR REMOVAL    . WRIST GANGLION EXCISION       OB History   No obstetric history on file.     Family History  Problem Relation Age of Onset  . Cancer Mother        kidney  . Cancer Paternal Aunt     Social History   Tobacco Use  .  Smoking status: Current Some Day Smoker    Types: Cigarettes  . Smokeless tobacco: Never Used  Substance Use Topics  . Alcohol use: Yes    Comment: occasionally  . Drug use: No    Home Medications Prior to Admission medications   Medication Sig Start Date End Date Taking? Authorizing Provider  gabapentin (NEURONTIN) 300 MG capsule Take 1 capsule (300 mg total) by mouth at bedtime. 09/23/18 10/23/18  Lurline Idol, FNP  hydrochlorothiazide (HYDRODIURIL) 12.5 MG tablet Take 12.5 mg by mouth daily. 03/12/19   [provider]  pantoprazole (PROTONIX) 20 MG tablet Take 1 tablet (20 mg total) by mouth daily for 14 days. 08/05/18 08/19/18  Couture, Cortni S, PA-C    Allergies    Patient has no known allergies.  Review of Systems   Review of Systems  Constitutional: Negative for fever.  HENT: Negative for sore throat.   Eyes: Negative for visual disturbance.  Respiratory: Positive for shortness of breath. Negative for cough.   Cardiovascular: Positive for chest pain.  Gastrointestinal: Positive for abdominal pain and nausea. Negative for constipation, diarrhea, hematemesis, hematochezia and vomiting.  Genitourinary: Negative for dysuria and hematuria.  Musculoskeletal: Positive for back pain.  Skin: Negative for rash.  Neurological: Negative for headaches.  Physical Exam Updated Vital Signs BP 102/69 (BP Location: Left Arm)   Pulse (!) 148   Temp 98.6 F (37 C) (Oral)   Resp (!) 22   Ht 5\' 6"  (1.676 m)   Wt 99.8 kg   LMP 11/15/2019   SpO2 98%   BMI 35.51 kg/m   Physical Exam Vitals and nursing note reviewed.  Constitutional:      General: She is not in acute distress.    Appearance: She is well-developed.  HENT:     Head: Normocephalic and atraumatic.  Eyes:     Conjunctiva/sclera: Conjunctivae normal.  Cardiovascular:     Rate and Rhythm: Regular rhythm. Tachycardia present.     Pulses: Normal pulses.     Heart sounds: No murmur.  Pulmonary:      Effort: Pulmonary effort is normal. No respiratory distress.     Breath sounds: Normal breath sounds.  Abdominal:     Palpations: Abdomen is soft.     Tenderness: There is abdominal tenderness.    Musculoskeletal:        General: No deformity or signs of injury. Normal range of motion.     Cervical back: Neck supple.  Skin:    General: Skin is warm and dry.  Neurological:     General: No focal deficit present.     Mental Status: She is alert.     ED Results / Procedures / Treatments   Labs (all labs ordered are listed, but only abnormal results are displayed) Labs Reviewed  BASIC METABOLIC PANEL - Abnormal; Notable for the following components:      Result Value   Potassium 3.1 (*)    CO2 19 (*)    Glucose, Bld 113 (*)    BUN 5 (*)    Calcium 8.8 (*)    Anion gap 16 (*)    All other components within normal limits  CBC - Abnormal; Notable for the following components:   WBC 3.1 (*)    RDW 17.6 (*)    All other components within normal limits  HEPATIC FUNCTION PANEL - Abnormal; Notable for the following components:   AST 307 (*)    ALT 80 (*)    Alkaline Phosphatase 142 (*)    Total Bilirubin 1.4 (*)    Bilirubin, Direct 0.7 (*)    All other components within normal limits  LIPASE, BLOOD  I-STAT BETA HCG BLOOD, ED ([facility], [facility], [facility] ONLY)  TROPONIN I (HIGH SENSITIVITY)  TROPONIN I (HIGH SENSITIVITY)    EKG EKG Interpretation  Date/Time:  Saturday November 22 2019 12:25:26 EST Ventricular Rate:  150 PR Interval:  130 QRS Duration: 70 QT Interval:  282 QTC Calculation: 445 R Axis:   70 Text Interpretation: Sinus tachycardia Septal infarct , age undetermined Abnormal ECG Rate faster and nonspecific st/ts since prior 11/19 Reconfirmed by 12/19 [phone]) on 11/22/2019 4:44:48 PM   Radiology DG Chest Port 1 View  Result Date: 11/22/2019 CLINICAL DATA:  44 year old female with a history of shortness of breath EXAM: PORTABLE CHEST 1 VIEW COMPARISON:  12/02/2011  FINDINGS: Cardiomediastinal silhouette within normal limits. Low lung volumes accentuate the interstitium. No pneumothorax. No pleural effusion. No confluent airspace disease. IMPRESSION: Likely atelectasis with low lung volumes and no evidence of acute cardiopulmonary disease Electronically Signed   By: 02/01/2012 D.O.   On: 11/22/2019 13:35   11/24/2019 Abdomen Limited RUQ  Result Date: 11/22/2019 CLINICAL DATA:  Right upper quadrant pain since 7 a.m. EXAM: ULTRASOUND ABDOMEN  LIMITED RIGHT UPPER QUADRANT COMPARISON:  CT abdomen and pelvis from 20/12 FINDINGS: Gallbladder: Gallbladder under distended, wall thickness at upper limits of normal. Sludge and/or calculi in the dependent gallbladder. No reported sonographic Murphy sign. Common bile duct: Diameter: 2.8 Liver: No focal lesion identified. Within normal limits in parenchymal echogenicity. Portal vein is patent on color Doppler imaging with normal direction of blood flow towards the liver. Other: None. IMPRESSION: 1. Signs of sludge and/or small stones in the gallbladder without sonographic evidence of acute cholecystitis. Electronically Signed   By: Zetta Bills M.D.   On: 11/22/2019 14:58    Procedures Procedures (including critical care time)  Medications Ordered in ED Medications  sodium chloride flush (NS) 0.9 % injection 3 mL (3 mLs Intravenous Given 11/22/19 1425)  sodium chloride 0.9 % bolus 500 mL (0 mLs Intravenous Stopped 11/22/19 1521)  ondansetron (ZOFRAN) injection 4 mg (4 mg Intravenous Given 11/22/19 1428)  fentaNYL (SUBLIMAZE) injection 50 mcg (50 mcg Intravenous Given 11/22/19 1426)  sodium chloride 0.9 % bolus 1,000 mL (1,000 mLs Intravenous Bolus from Bag 11/22/19 1522)  pantoprazole (PROTONIX) injection 40 mg (40 mg Intravenous Given 11/22/19 1522)    ED Course  I have reviewed the triage vital signs and the nursing notes.  Pertinent labs & imaging results that were available during my care of the patient were reviewed by me  and considered in my medical decision making (see chart for details).  Clinical Course as of Nov 21 1640  Sat Nov 22, 2019  1323 Differential includes biliary colic, cholecystitis, peptic ulcer disease, reflux, atypical ACS, pneumothorax, pneumonia, vascular   [MB]  1331 Chest x-ray with no gross infiltrates or pneumothorax.  Interpreted by me.  Awaiting radiology reading.   [MB]  1332 White blood cell count low with unclear significance.   [MB]  7262 Patient is states she feels improved.  Still tachycardic although better than arrival.  She said she was drinking pretty heavy on Thursday and again last night.  This may account for the LFTs.  Ultrasound showed some sludge may be some small stones but no evidence of cholecystitis.  We will give her some more fluids and PPI.   [MB]    Clinical Course User Index [MB] Hayden Rasmussen, MD   MDM Rules/Calculators/A&P                     Patient signed out to Dr Vanita Panda with plan for reassessment after fluids and ppi.   Final Clinical Impression(s) / ED Diagnoses Final diagnoses:  RUQ abdominal pain    Rx / DC Orders ED Discharge Orders    None       Hayden Rasmussen, MD 11/22/19 1645

## 2019-11-22 NOTE — ED Provider Notes (Signed)
Care of the patient assumed at signout.  On my initial exam the patient is vomiting, continues to complain of right upper quadrant pain.  5:41 PM Now after Phenergan, and after prior interventions including fluids, antiemetics, she is somewhat better, though she continues to complain of pain in the right upper quadrant.  Again discussed her results, including ultrasound and lab findings, and I discussed them with her general surgery colleague for assistance given her persistent symptoms, possibly due to symptomatic cholelithiasis.   Gerhard Munch, MD 11/22/19 [phone]

## 2019-11-22 NOTE — H&P (Signed)
[name] is an 44 y.o. female.   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  The pain radiates to her back.  No diarrhea or constipation.  US showed cholelithiasis but no cholecystitis.  Her symptoms persist despite pain and nausea medication.  We are asked to admit for cholecystectomy.    Past Medical History:  Diagnosis Date  . Acid reflux   . Anemia   . Ulcer     Past Surgical History:  Procedure Laterality Date  . ABDOMINAL SURGERY    . BREAST BIOPSY Right 2013  . COSMETIC SURGERY     tummy tuck  . HERNIA REPAIR    . ROUX-EN-Y PROCEDURE    . TUMOR REMOVAL    . WRIST GANGLION EXCISION      Family History  Problem Relation Age of Onset  . Cancer Mother        kidney  . Cancer Paternal Aunt    Social History:  reports that she has been smoking cigarettes. She has never used smokeless tobacco. She reports current alcohol use. She reports that she does not use drugs.  Allergies:  Allergies  Allergen Reactions  . Other Rash    Burns skin -- Cannot use Hypofix or Transpore. Only Paper Tape   Prior to Admission medications   Medication Sig Start Date End Date Taking? Authorizing Provider  acetaminophen (TYLENOL) 500 MG tablet Take 1,000 mg by mouth every 6 (six) hours as needed for mild pain.   Yes [provider]  gabapentin (NEURONTIN) 300 MG capsule Take 1 capsule (300 mg total) by mouth at bedtime. 09/23/18 11/22/19 Yes Enrique Sack, FNP  hydrochlorothiazide (HYDRODIURIL) 12.5 MG tablet Take 12.5 mg by mouth daily. 03/12/19  Yes [provider]  pantoprazole (PROTONIX) 20 MG tablet Take 1 tablet (20 mg total) by mouth daily for 14 days. 08/05/18 08/19/18  Couture, Cortni S, PA-C     Results for orders  placed or performed during the hospital encounter of 11/22/19 (from the past 48 hour(s))  Basic metabolic panel     Status: Abnormal   Collection Time: 11/22/19 12:49 PM  Result Value Ref Range   Sodium 138 135 - 145 mmol/L   Potassium 3.1 (L) 3.5 - 5.1 mmol/L   Chloride 103 98 - 111 mmol/L   CO2 19 (L) 22 - 32 mmol/L   Glucose, Bld 113 (H) 70 - 99 mg/dL    Comment: Glucose reference range applies only to samples taken after fasting for at least 8 hours.   BUN 5 (L) 6 - 20 mg/dL   Creatinine, Ser 0.68 0.44 - 1.00 mg/dL   Calcium 8.8 (L) 8.9 - 10.3 mg/dL   GFR calc non Af Amer >60 >60 mL/min   GFR calc Af Amer >60 >60 mL/min   Anion gap 16 (H) 5 - 15    Comment: Performed at [facility] [address] [place] [address]  CBC     Status: Abnormal   Collection Time: 11/22/19 12:49 PM  Result Value Ref Range   WBC 3.1 (L) 4.0 - 10.5 K/uL   RBC 4.01 3.87 - 5.11 MIL/uL   Hemoglobin 12.5 12.0 - 15.0 g/dL   HCT 39.0 36.0 - 46.0 %  MCV 97.3 80.0 - 100.0 fL   MCH 31.2 26.0 - 34.0 pg   MCHC 32.1 30.0 - 36.0 g/dL   RDW 54.6 (H) 27.0 - 35.0 %   Platelets 276 150 - 400 K/uL   nRBC 0.0 0.0 - 0.2 %    Comment: Performed at [facility], [address] [place], [place] [address]  Troponin I (High Sensitivity)     Status: None   Collection Time: 11/22/19 12:49 PM  Result Value Ref Range   Troponin I (High Sensitivity) 6 <18 ng/L    Comment: (NOTE) Elevated high sensitivity troponin I (hsTnI) values and significant  changes across serial measurements may suggest ACS but many other  chronic and acute conditions are known to elevate hsTnI results.  Refer to the "Links" section for chest pain algorithms and additional  guidance. Performed at [facility], [address], [place], [place] [address]   Hepatic function panel     Status: Abnormal   Collection Time: 11/22/19 12:49 PM  Result Value Ref Range   Total Protein 6.9 6.5 - 8.1 g/dL   Albumin 3.5 3.5 - 5.0  g/dL   AST 716 (H) 15 - 41 U/L   ALT 80 (H) 0 - 44 U/L   Alkaline Phosphatase 142 (H) 38 - 126 U/L   Total Bilirubin 1.4 (H) 0.3 - 1.2 mg/dL   Bilirubin, Direct 0.7 (H) 0.0 - 0.2 mg/dL   Indirect Bilirubin 0.7 0.3 - 0.9 mg/dL    Comment: Performed at [facility], [address], [place], [place] [address]  Lipase, blood     Status: None   Collection Time: 11/22/19 12:49 PM  Result Value Ref Range   Lipase 28 11 - 51 U/L    Comment: Performed at [facility], [address] [place], [place] [address]  I-Stat beta hCG blood, ED     Status: None   Collection Time: 11/22/19  1:07 PM  Result Value Ref Range   I-stat hCG, quantitative <5.0 <5 mIU/mL   Comment 3            Comment:   GEST. AGE      CONC.  (mIU/mL)   <=1 WEEK        5 - 50     2 WEEKS       50 - 500     3 WEEKS       100 - 10,000     4 WEEKS     1,000 - 30,000        FEMALE AND NON-PREGNANT FEMALE:     LESS THAN 5 mIU/mL   Troponin I (High Sensitivity)     Status: None   Collection Time: 11/22/19  2:35 PM  Result Value Ref Range   Troponin I (High Sensitivity) 8 <18 ng/L    Comment: (NOTE) Elevated high sensitivity troponin I (hsTnI) values and significant  changes across serial measurements may suggest ACS but many other  chronic and acute conditions are known to elevate hsTnI results.  Refer to the "Links" section for chest pain algorithms and additional  guidance. Performed at [facility], [address] [place], [place] [address]    DG Chest Port 1 View  Result Date: 11/22/2019 CLINICAL DATA:  44 year old female with a history of shortness of breath EXAM: PORTABLE CHEST 1 VIEW COMPARISON:  12/02/2011 FINDINGS: Cardiomediastinal silhouette within normal limits. Low lung volumes accentuate the interstitium. No pneumothorax.  No pleural effusion. No confluent airspace disease. IMPRESSION: Likely atelectasis with low lung volumes and no evidence of acute cardiopulmonary disease Electronically  Signed   By: Gilmer Mor D.O.   On: 11/22/2019 13:35   US Abdomen Limited RUQ  Result Date: 11/22/2019 CLINICAL DATA:  Right upper quadrant pain since 7 a.m. EXAM: ULTRASOUND ABDOMEN LIMITED RIGHT UPPER QUADRANT COMPARISON:  CT abdomen and pelvis from 20/12 FINDINGS: Gallbladder: Gallbladder under distended, wall thickness at upper limits of normal. Sludge and/or calculi in the dependent gallbladder. No reported sonographic Murphy sign. Common bile duct: Diameter: 2.8 Liver: No focal lesion identified. Within normal limits in parenchymal echogenicity. Portal vein is patent on color Doppler imaging with normal direction of blood flow towards the liver. Other: None. IMPRESSION: 1. Signs of sludge and/or small stones in the gallbladder without sonographic evidence of acute cholecystitis. Electronically Signed   By: Donzetta Kohut M.D.   On: 11/22/2019 14:58    Review of Systems Review of Systems  Constitutional: Negative for fever, chills and unexpected weight change.  HENT: Negative for hearing loss, congestion, sore throat, trouble swallowing and voice change.  Eyes: Negative for visual disturbance.  Respiratory: Negative for cough and wheezing.  Cardiovascular: Negative for chest pain, palpitations and leg swelling.  Gastrointestinal: Positive for nausea, abdominal pain and distention.  Negative for vomiting, diarrhea, constipation, blood in stool, and anal bleeding.  Genitourinary: Negative for hematuria, vaginal bleeding and difficulty urinating.  Musculoskeletal: Negative for arthralgias.  Skin: Negative for rash and wound.  Neurological: Negative for seizures, syncope and headaches.  Hematological: Negative for adenopathy. Does not bruise/bleed easily.  Psychiatric/Behavioral: Negative for confusion.  10-system review otherwise negative.  Blood pressure (!) 139/111, pulse (!) 120, temperature 98.6 F (37 C), temperature source Oral, resp. rate (!) 25, height 5\' 6"  (1.676 m), weight  99.8 kg, last menstrual period 11/15/2019, SpO2 99 %. Physical Exam  Constitutional:  WDWN in NAD, conversant, no obvious deformities; lying in bed comfortably Eyes:  Pupils equal, round; sclera anicteric; moist conjunctiva; no lid lag HENT:  Oral mucosa moist; good dentition  Neck:  No masses palpated, trachea midline; no thyromegaly Lungs:  CTA bilaterally; normal respiratory effort CV:  Regular rate and rhythm; no murmurs; extremities well-perfused with no edema Abd:  +bowel sounds, obese,soft, tender in RUQ and epigastrium, no palpable organomegaly; no palpable hernias; healed laparoscopic incisions; no peritonitis Musc:  Unable to assess gait; no apparent clubbing or cyanosis in extremities Lymphatic:  No palpable cervical or axillary lymphadenopathy Skin:  Warm, dry; no sign of jaundice Psychiatric - alert and oriented x 4; calm mood and affect  Assessment/Plan Early acute cholecystitis - mildly elevated bilirubin  Admit for IV hydration, IV antibiotics Probable laparoscopic cholecystectomy with cholangiogram by Dr. 11/17/2019 tomorrow.  He will discuss further with the patient tomorrow.  NPO P MN  Sheliah Hatch, MD 11/22/2019, 6:01 PM

## 2019-11-23 ENCOUNTER — Observation Stay (HOSPITAL_COMMUNITY): Payer: No Typology Code available for payment source | Admitting: Anesthesiology

## 2019-11-23 ENCOUNTER — Inpatient Hospital Stay (HOSPITAL_COMMUNITY): Payer: No Typology Code available for payment source

## 2019-11-23 ENCOUNTER — Encounter (HOSPITAL_COMMUNITY): Admission: EM | Disposition: A | Discharge status: Home / Self Care | Payer: Self-pay | Source: Home / Self Care

## 2019-11-23 ENCOUNTER — Encounter (HOSPITAL_COMMUNITY): Payer: Self-pay

## 2019-11-23 DIAGNOSIS — K8000 Calculus of gallbladder with acute cholecystitis without obstruction: Secondary | ICD-10-CM | POA: Diagnosis present

## 2019-11-23 DIAGNOSIS — Z79899 Other long term (current) drug therapy: Secondary | ICD-10-CM | POA: Diagnosis not present

## 2019-11-23 DIAGNOSIS — Z20822 Contact with and (suspected) exposure to covid-19: Secondary | ICD-10-CM | POA: Diagnosis present

## 2019-11-23 DIAGNOSIS — Z8711 Personal history of peptic ulcer disease: Secondary | ICD-10-CM | POA: Diagnosis not present

## 2019-11-23 DIAGNOSIS — R160 Hepatomegaly, not elsewhere classified: Secondary | ICD-10-CM | POA: Diagnosis present

## 2019-11-23 DIAGNOSIS — F17210 Nicotine dependence, cigarettes, uncomplicated: Secondary | ICD-10-CM | POA: Diagnosis present

## 2019-11-23 DIAGNOSIS — Z888 Allergy status to other drugs, medicaments and biological substances status: Secondary | ICD-10-CM | POA: Diagnosis not present

## 2019-11-23 DIAGNOSIS — Z23 Encounter for immunization: Secondary | ICD-10-CM | POA: Diagnosis not present

## 2019-11-23 DIAGNOSIS — K219 Gastro-esophageal reflux disease without esophagitis: Secondary | ICD-10-CM | POA: Diagnosis present

## 2019-11-23 DIAGNOSIS — R1011 Right upper quadrant pain: Secondary | ICD-10-CM | POA: Diagnosis present

## 2019-11-23 DIAGNOSIS — Z9884 Bariatric surgery status: Secondary | ICD-10-CM | POA: Diagnosis not present

## 2019-11-23 HISTORY — PX: CHOLECYSTECTOMY: SHX55

## 2019-11-23 LAB — COMPREHENSIVE METABOLIC PANEL
ALT: 122 U/L — ABNORMAL HIGH (ref 0–44)
AST: 307 U/L — ABNORMAL HIGH (ref 15–41)
Albumin: 2.9 g/dL — ABNORMAL LOW (ref 3.5–5.0)
Alkaline Phosphatase: 62 U/L (ref 38–126)
Anion gap: 15 (ref 5–15)
BUN: 10 mg/dL (ref 6–20)
CO2: 18 mmol/L — ABNORMAL LOW (ref 22–32)
Calcium: 7.8 mg/dL — ABNORMAL LOW (ref 8.9–10.3)
Chloride: 108 mmol/L (ref 98–111)
Creatinine, Ser: 1.47 mg/dL — ABNORMAL HIGH (ref 0.44–1.00)
GFR calc Af Amer: 51 mL/min — ABNORMAL LOW (ref >60)
GFR calc non Af Amer: 44 mL/min — ABNORMAL LOW (ref >60)
Glucose, Bld: 93 mg/dL (ref 70–99)
Potassium: 4.5 mmol/L (ref 3.5–5.1)
Sodium: 141 mmol/L (ref 135–145)
Total Bilirubin: 3.2 mg/dL — ABNORMAL HIGH (ref 0.3–1.2)
Total Protein: 6 g/dL — ABNORMAL LOW (ref 6.5–8.1)

## 2019-11-23 LAB — RESPIRATORY PANEL BY RT PCR (FLU A&B, COVID)
Influenza A by PCR: NEGATIVE
Influenza B by PCR: NEGATIVE
SARS Coronavirus 2 by RT PCR: NEGATIVE

## 2019-11-23 LAB — HIV ANTIBODY (ROUTINE TESTING W REFLEX): HIV Screen 4th Generation wRfx: NONREACTIVE

## 2019-11-23 LAB — CBC
HCT: 34.4 % — ABNORMAL LOW (ref 36.0–46.0)
Hemoglobin: 11 g/dL — ABNORMAL LOW (ref 12.0–15.0)
MCH: 31 pg (ref 26.0–34.0)
MCHC: 32 g/dL (ref 30.0–36.0)
MCV: 96.9 fL (ref 80.0–100.0)
Platelets: 197 10*3/uL (ref 150–400)
RBC: 3.55 MIL/uL — ABNORMAL LOW (ref 3.87–5.11)
RDW: 17.5 % — ABNORMAL HIGH (ref 11.5–15.5)
WBC: 15.2 10*3/uL — ABNORMAL HIGH (ref 4.0–10.5)
nRBC: 0 % (ref 0.0–0.2)

## 2019-11-23 SURGERY — LAPAROSCOPIC CHOLECYSTECTOMY WITH INTRAOPERATIVE CHOLANGIOGRAM
Anesthesia: General

## 2019-11-23 MED ORDER — MIDAZOLAM HCL 5 MG/5ML IJ SOLN
INTRAMUSCULAR | Status: DC | PRN
  Administered 2019-11-23: 2 mg via INTRAVENOUS
  Administered 2019-11-23 (×2): 1 mg via INTRAVENOUS

## 2019-11-23 MED ORDER — OXYCODONE HCL 5 MG PO TABS
ORAL_TABLET | ORAL | Status: AC
  Filled 2019-11-23: qty 1

## 2019-11-23 MED ORDER — MIDAZOLAM HCL 2 MG/2ML IJ SOLN
INTRAMUSCULAR | Status: AC
  Filled 2019-11-23: qty 2

## 2019-11-23 MED ORDER — ACETAMINOPHEN 10 MG/ML IV SOLN
1000.0000 mg | Freq: Once | INTRAVENOUS | Status: AC
  Administered 2019-11-23: 1000 mg via INTRAVENOUS

## 2019-11-23 MED ORDER — PHENYLEPHRINE HCL-NACL 10-0.9 MG/250ML-% IV SOLN
INTRAVENOUS | Status: DC | PRN
  Administered 2019-11-23: 50 ug/min via INTRAVENOUS

## 2019-11-23 MED ORDER — FENTANYL CITRATE (PF) 100 MCG/2ML IJ SOLN
25.0000 ug | INTRAMUSCULAR | Status: DC | PRN
  Administered 2019-11-23: 25 ug via INTRAVENOUS

## 2019-11-23 MED ORDER — ROCURONIUM BROMIDE 100 MG/10ML IV SOLN
INTRAVENOUS | Status: DC | PRN
  Administered 2019-11-23: 40 mg via INTRAVENOUS
  Administered 2019-11-23 (×2): 10 mg via INTRAVENOUS

## 2019-11-23 MED ORDER — SUCCINYLCHOLINE CHLORIDE 20 MG/ML IJ SOLN
INTRAMUSCULAR | Status: DC | PRN
  Administered 2019-11-23: 130 mg via INTRAVENOUS

## 2019-11-23 MED ORDER — FENTANYL CITRATE (PF) 250 MCG/5ML IJ SOLN
INTRAMUSCULAR | Status: AC
  Filled 2019-11-23: qty 5

## 2019-11-23 MED ORDER — ROCURONIUM BROMIDE 10 MG/ML (PF) SYRINGE
PREFILLED_SYRINGE | INTRAVENOUS | Status: AC
  Filled 2019-11-23: qty 10

## 2019-11-23 MED ORDER — SUGAMMADEX SODIUM 200 MG/2ML IV SOLN
INTRAVENOUS | Status: DC | PRN
  Administered 2019-11-23: 200 mg via INTRAVENOUS

## 2019-11-23 MED ORDER — ACETAMINOPHEN 10 MG/ML IV SOLN
INTRAVENOUS | Status: AC
  Filled 2019-11-23: qty 100

## 2019-11-23 MED ORDER — BUPIVACAINE HCL 0.25 % IJ SOLN
INTRAMUSCULAR | Status: DC | PRN
  Administered 2019-11-23: 20 mL

## 2019-11-23 MED ORDER — PROPOFOL 10 MG/ML IV BOLUS
INTRAVENOUS | Status: AC
  Filled 2019-11-23: qty 20

## 2019-11-23 MED ORDER — BUPIVACAINE HCL (PF) 0.25 % IJ SOLN
INTRAMUSCULAR | Status: AC
  Filled 2019-11-23: qty 30

## 2019-11-23 MED ORDER — PROPOFOL 10 MG/ML IV BOLUS
INTRAVENOUS | Status: DC | PRN
  Administered 2019-11-23: 160 mg via INTRAVENOUS

## 2019-11-23 MED ORDER — DEXAMETHASONE SODIUM PHOSPHATE 10 MG/ML IJ SOLN
INTRAMUSCULAR | Status: AC
  Filled 2019-11-23: qty 1

## 2019-11-23 MED ORDER — LIDOCAINE 2% (20 MG/ML) 5 ML SYRINGE
INTRAMUSCULAR | Status: AC
  Filled 2019-11-23: qty 5

## 2019-11-23 MED ORDER — LIDOCAINE 2% (20 MG/ML) 5 ML SYRINGE
INTRAMUSCULAR | Status: DC | PRN
  Administered 2019-11-23: 60 mg via INTRAVENOUS

## 2019-11-23 MED ORDER — ONDANSETRON HCL 4 MG/2ML IJ SOLN
INTRAMUSCULAR | Status: AC
  Filled 2019-11-23: qty 2

## 2019-11-23 MED ORDER — SODIUM CHLORIDE 0.9 % IV SOLN
INTRAVENOUS | Status: DC | PRN
  Administered 2019-11-23: 10 mL

## 2019-11-23 MED ORDER — LACTATED RINGERS IV SOLN: INTRAVENOUS | Status: DC

## 2019-11-23 MED ORDER — ONDANSETRON HCL 4 MG/2ML IJ SOLN
INTRAMUSCULAR | Status: DC | PRN
  Administered 2019-11-23: 4 mg via INTRAVENOUS

## 2019-11-23 MED ORDER — SODIUM CHLORIDE 0.9 % IR SOLN
Status: DC | PRN
  Administered 2019-11-23: 1000 mL

## 2019-11-23 MED ORDER — 0.9 % SODIUM CHLORIDE (POUR BTL) OPTIME
TOPICAL | Status: DC | PRN
  Administered 2019-11-23: 16:00:00 1000 mL

## 2019-11-23 MED ORDER — FENTANYL CITRATE (PF) 100 MCG/2ML IJ SOLN
INTRAMUSCULAR | Status: AC
  Filled 2019-11-23: qty 2

## 2019-11-23 MED ORDER — FENTANYL CITRATE (PF) 250 MCG/5ML IJ SOLN
INTRAMUSCULAR | Status: DC | PRN
  Administered 2019-11-23: 25 ug via INTRAVENOUS
  Administered 2019-11-23 (×6): 50 ug via INTRAVENOUS

## 2019-11-23 MED ORDER — DEXAMETHASONE SODIUM PHOSPHATE 10 MG/ML IJ SOLN
INTRAMUSCULAR | Status: DC | PRN
  Administered 2019-11-23: 10 mg via INTRAVENOUS

## 2019-11-23 SURGICAL SUPPLY — 44 items
APPLIER CLIP ROT 10 11.4 M/L (STAPLE) ×2
APR CLP MED LRG 11.4X10 (STAPLE) ×1
BLADE CLIPPER SURG (BLADE) IMPLANT
CANISTER SUCT 3000ML PPV (MISCELLANEOUS) ×2 IMPLANT
CATH CHOLANG 76X19 KUMAR (CATHETERS) ×2 IMPLANT
CHLORAPREP W/TINT 26 (MISCELLANEOUS) ×2 IMPLANT
CLIP APPLIE ROT 10 11.4 M/L (STAPLE) ×1 IMPLANT
CLIP VESOLOCK MED LG 6/CT (CLIP) IMPLANT
COVER MAYO STAND STRL (DRAPES) ×2 IMPLANT
COVER SURGICAL LIGHT HANDLE (MISCELLANEOUS) ×2 IMPLANT
COVER WAND RF STERILE (DRAPES) IMPLANT
DERMABOND ADVANCED (GAUZE/BANDAGES/DRESSINGS) ×1
DERMABOND ADVANCED .7 DNX12 (GAUZE/BANDAGES/DRESSINGS) ×1 IMPLANT
DRAPE C-ARM 42X120 X-RAY (DRAPES) ×2 IMPLANT
DRAPE UTILITY XL STRL (DRAPES) ×2 IMPLANT
ELECT REM PT RETURN 9FT ADLT (ELECTROSURGICAL) ×2
ELECTRODE REM PT RTRN 9FT ADLT (ELECTROSURGICAL) ×1 IMPLANT
GLOVE BIOGEL PI IND STRL 7.0 (GLOVE) ×1 IMPLANT
GLOVE BIOGEL PI INDICATOR 7.0 (GLOVE) ×1
GLOVE SURG SS PI 7.0 STRL IVOR (GLOVE) ×2 IMPLANT
GOWN STRL REUS W/ TWL LRG LVL3 (GOWN DISPOSABLE) ×3 IMPLANT
GOWN STRL REUS W/TWL LRG LVL3 (GOWN DISPOSABLE) ×3
GRASPER SUT TROCAR 14GX15 (MISCELLANEOUS) ×2 IMPLANT
IV CATH AUTO 14GX1.75 SAFE ORG (IV SOLUTION) ×2 IMPLANT
KIT BASIN OR (CUSTOM PROCEDURE TRAY) ×2 IMPLANT
KIT TURNOVER KIT B (KITS) ×2 IMPLANT
NEEDLE 22X1 1/2 (OR ONLY) (NEEDLE) ×2 IMPLANT
NS IRRIG 1000ML POUR BTL (IV SOLUTION) ×2 IMPLANT
PAD ARMBOARD 7.5X6 YLW CONV (MISCELLANEOUS) ×2 IMPLANT
POUCH RETRIEVAL ECOSAC 10 (ENDOMECHANICALS) ×1 IMPLANT
POUCH RETRIEVAL ECOSAC 10MM (ENDOMECHANICALS) ×1
SCISSORS LAP 5X35 DISP (ENDOMECHANICALS) ×2 IMPLANT
SET IRRIG TUBING LAPAROSCOPIC (IRRIGATION / IRRIGATOR) ×2 IMPLANT
SET TUBE SMOKE EVAC HIGH FLOW (TUBING) ×2 IMPLANT
SLEEVE ENDOPATH XCEL 5M (ENDOMECHANICALS) ×6 IMPLANT
SPECIMEN JAR SMALL (MISCELLANEOUS) ×2 IMPLANT
STOPCOCK 4 WAY LG BORE MALE ST (IV SETS) ×2 IMPLANT
SUT MNCRL AB 4-0 PS2 18 (SUTURE) ×2 IMPLANT
TOWEL GREEN STERILE (TOWEL DISPOSABLE) ×2 IMPLANT
TOWEL GREEN STERILE FF (TOWEL DISPOSABLE) ×2 IMPLANT
TRAY LAPAROSCOPIC MC (CUSTOM PROCEDURE TRAY) ×2 IMPLANT
TROCAR XCEL 12X100 BLDLESS (ENDOMECHANICALS) ×2 IMPLANT
TROCAR XCEL NON-BLD 5MMX100MML (ENDOMECHANICALS) ×2 IMPLANT
WATER STERILE IRR 1000ML POUR (IV SOLUTION) ×2 IMPLANT

## 2019-11-23 NOTE — Anesthesia Procedure Notes (Signed)
Procedure Name: Intubation Date/Time: 11/23/2019 3:42 PM Performed by: Edmonia Caprio, CRNA Pre-anesthesia Checklist: Patient identified, Emergency Drugs available, Suction available, Patient being monitored and Timeout performed Patient Re-evaluated:Patient Re-evaluated prior to induction Oxygen Delivery Method: Circle system utilized Preoxygenation: Pre-oxygenation with 100% oxygen Induction Type: IV induction, Rapid sequence and Cricoid Pressure applied Laryngoscope Size: Miller and 2 Grade View: Grade II Tube type: Oral Tube size: 7.5 mm Number of attempts: 1 Airway Equipment and Method: Stylet Placement Confirmation: ETT inserted through vocal cords under direct vision,  positive ETCO2 and breath sounds checked- equal and bilateral Secured at: 23 cm Tube secured with: Tape Dental Injury: Teeth and Oropharynx as per pre-operative assessment

## 2019-11-23 NOTE — Op Note (Signed)
PATIENT:  [name]  44 y.o. female  PRE-OPERATIVE DIAGNOSIS:  cholecystitis  POST-OPERATIVE DIAGNOSIS:  cholecystitis  PROCEDURE:  Procedure(s): LAPAROSCOPIC CHOLECYSTECTOMY WITH INTRAOPERATIVE CHOLANGIOGRAM  SURGEON:  Surgeon(s): Eiko Mcgowen, De Blanch, MD  ASSISTANT: none  ANESTHESIA:   local and general  Indications for procedure: [name] is a 44 y.o. female with symptoms of Abdominal pain and Nausea and vomiting consistent with gallbladder disease, Confirmed by Ultrasound.  Description of procedure: The patient was brought into the operative suite, placed supine. Anesthesia was administered with endotracheal tube. Patient was strapped in place and foot board was secured. All pressure points were offloaded by foam padding. The patient was prepped and draped in the usual sterile fashion.  A small incision was made to the right of the umbilicus. A 53mm trocar was inserted into the peritoneal cavity with optical entry. Pneumoperitoneum was applied with high flow low pressure. 2 59mm trocars were placed in the RUQ. A 52mm trocar was placed in the subxiphoid space. Marcaine was infused to the subxiphoid space and lateral upper right abdomen in the transversus abdominis plane. Next the patient was placed in reverse trendelenberg. The liver was quite large down to just above the umbilical level. It was fatty without scarring. The gallbladder was identified under it without adhesions.  The gallbladder was retracted cephalad and lateral. The peritoneum was reflected off the infundibulum working lateral to medial. The cystic duct and cystic artery were identified and further dissection revealed a critical view, due to concern for choledocholithiasis a cholangiogram was performed with ductotomy and cook catheter passed through a separate subcostal stab incision. Normal ductal anatomy was visualized with emptying into the duodenum. The cystic duct and cystic artery were doubly clipped and  ligated.   The gallbladder was removed off the liver bed with cautery. The Gallbladder was placed in a specimen bag. The gallbladder fossa was irrigated and hemostasis was applied with cautery. The gallbladder was removed via the 86mm trocar. No dilation was required for removal, therefore no fascial closure was performed. Pneumoperitoneum was removed, all trocar were removed. All incisions were closed with 4-0 monocryl subcuticular stitch. The patient woke from anesthesia and was brought to PACU in stable condition. All counts were correct  Findings: very hypertrophic liver, normal ductal anatomy  Specimen: gallbladder  Blood loss: 10 ml  Local anesthesia: 20 ml marcaine  Complications: none  PLAN OF CARE: Admit to inpatient   PATIENT DISPOSITION:  PACU - hemodynamically stable.  Feliciana Rossetti, M.D. General, Bariatric, & Minimally Invasive Surgery [facility]

## 2019-11-23 NOTE — Progress Notes (Signed)
Pt returned to [facility] from the OR, due to an emergent case. AAOX4. Pt in no apparent distress or pain. Cardiac monitor applied. Will continue to monitor. Pt given a phone to give her mother an update.

## 2019-11-23 NOTE — Progress Notes (Signed)
Pt taken back to the OR for surgery.

## 2019-11-23 NOTE — Progress Notes (Signed)
Pre Procedure note for inpatients:   [name] has been scheduled for Procedure(s): LAPAROSCOPIC CHOLECYSTECTOMY WITH INTRAOPERATIVE CHOLANGIOGRAM (N/A) today. The various methods of treatment have been discussed with the patient. After consideration of the risks, benefits and treatment options the patient has consented to the planned procedure.  -We discussed the etiology of her pain, we discussed treatment options and recommended surgery. We discussed details of surgery including general anesthesia, laparoscopic approach, identification of cystic duct and common bile duct. Ligation of cystic duct and cystic artery. Possible need for intraoperative cholangiogram or open procedure. Possible risks of common bile duct injury, liver injury, cystic duct leak, bleeding, infection, post-cholecystectomy syndrome. The patient showed good understanding and all questions were answered -we also discussed the new bilirubin lab results. We discussed possible procedures if the IOC was positive including laparoscopic bile duct exploration, possible gastrotomy and possible endoscopy/ERCP. She showed understanding and wanted to proceed.  The patient has been seen and labs reviewed. There are no changes in the patient's condition to prevent proceeding with the planned procedure today.  Recent labs:  Lab Results  Component Value Date   WBC 15.2 (H) 11/23/2019   HGB 11.0 (L) 11/23/2019   HCT 34.4 (L) 11/23/2019   PLT 197 11/23/2019   GLUCOSE 93 11/23/2019   ALT 122 (H) 11/23/2019   AST 307 (H) 11/23/2019   NA 141 11/23/2019   K 4.5 11/23/2019   CL 108 11/23/2019   CREATININE 1.47 (H) 11/23/2019   BUN 10 11/23/2019   CO2 18 (L) 11/23/2019    Rodman Pickle, MD 11/23/2019 8:04 AM

## 2019-11-23 NOTE — Anesthesia Postprocedure Evaluation (Signed)
Anesthesia Post Note  Patient: [name]  Procedure(s) Performed: LAPAROSCOPIC CHOLECYSTECTOMY WITH INTRAOPERATIVE CHOLANGIOGRAM (N/A )     Patient location during evaluation: PACU Anesthesia Type: General Level of consciousness: awake Pain management: pain level controlled Vital Signs Assessment: post-procedure vital signs reviewed and stable Cardiovascular status: stable Postop Assessment: no apparent nausea or vomiting Anesthetic complications: no    Last Vitals:  Vitals:   11/23/19 1715 11/23/19 1730  BP: 126/69 121/74  Pulse: (!) 122 (!) 118  Resp: (!) 29 (!) 22  Temp:    SpO2: 91% 96%    Last Pain:  Vitals:   11/23/19 1415  TempSrc:   PainSc: 0-No pain                 Yaa Donnellan

## 2019-11-23 NOTE — Anesthesia Preprocedure Evaluation (Signed)
Anesthesia Evaluation  Patient identified by MRN, date of birth, ID band Patient awake    Reviewed: Allergy & Precautions, NPO status   Airway Mallampati: II  TM Distance: >3 FB     Dental   Pulmonary Current Smoker and Patient abstained from smoking.,    breath sounds clear to auscultation       Cardiovascular negative cardio ROS   Rhythm:Regular Rate:Normal     Neuro/Psych    GI/Hepatic Neg liver ROS, GERD  ,History noted, CG   Endo/Other  negative endocrine ROS  Renal/GU negative Renal ROS     Musculoskeletal   Abdominal   Peds  Hematology  (+) anemia ,   Anesthesia Other Findings   Reproductive/Obstetrics                             Anesthesia Physical Anesthesia Plan  ASA: III  Anesthesia Plan: General   Post-op Pain Management:    Induction: Intravenous  PONV Risk Score and Plan: 2 and Ondansetron, Dexamethasone and Midazolam  Airway Management Planned: Oral ETT  Additional Equipment:   Intra-op Plan:   Post-operative Plan: Extubation in OR  Informed Consent: I have reviewed the patients History and Physical, chart, labs and discussed the procedure including the risks, benefits and alternatives for the proposed anesthesia with the patient or authorized representative who has indicated his/her understanding and acceptance.     Dental advisory given  Plan Discussed with: CRNA and Anesthesiologist  Anesthesia Plan Comments:         Anesthesia Quick Evaluation

## 2019-11-23 NOTE — Transfer of Care (Signed)
Immediate Anesthesia Transfer of Care Note  Patient: [name]  Procedure(s) Performed: LAPAROSCOPIC CHOLECYSTECTOMY WITH INTRAOPERATIVE CHOLANGIOGRAM (N/A )  Patient Location: PACU  Anesthesia Type:General  Level of Consciousness: awake, alert  and oriented  Airway & Oxygen Therapy: Patient Spontanous Breathing and Patient connected to face mask oxygen  Post-op Assessment: Report given to RN and Post -op Vital signs reviewed and stable  Post vital signs: Reviewed and stable  Last Vitals:  Vitals Value Taken Time  BP 126/69 11/23/19 1715  Temp    Pulse 120 11/23/19 1717  Resp 32 11/23/19 1717  SpO2 94 % 11/23/19 1717  Vitals shown include unvalidated device data.  Last Pain:  Vitals:   11/23/19 1415  TempSrc:   PainSc: 0-No pain      Patients Stated Pain Goal: 3 (11/23/19 1248)  Complications: No apparent anesthesia complications

## 2019-11-24 ENCOUNTER — Telehealth: Payer: Self-pay | Admitting: Internal Medicine

## 2019-11-24 LAB — CBC
HCT: 31.9 % — ABNORMAL LOW (ref 36.0–46.0)
Hemoglobin: 10.1 g/dL — ABNORMAL LOW (ref 12.0–15.0)
MCH: 31.1 pg (ref 26.0–34.0)
MCHC: 31.7 g/dL (ref 30.0–36.0)
MCV: 98.2 fL (ref 80.0–100.0)
Platelets: 175 10*3/uL (ref 150–400)
RBC: 3.25 MIL/uL — ABNORMAL LOW (ref 3.87–5.11)
RDW: 17.8 % — ABNORMAL HIGH (ref 11.5–15.5)
WBC: 11.9 10*3/uL — ABNORMAL HIGH (ref 4.0–10.5)
nRBC: 0 % (ref 0.0–0.2)

## 2019-11-24 LAB — COMPREHENSIVE METABOLIC PANEL
ALT: 131 U/L — ABNORMAL HIGH (ref 0–44)
AST: 295 U/L — ABNORMAL HIGH (ref 15–41)
Albumin: 2.7 g/dL — ABNORMAL LOW (ref 3.5–5.0)
Alkaline Phosphatase: 45 U/L (ref 38–126)
Anion gap: 11 (ref 5–15)
BUN: 14 mg/dL (ref 6–20)
CO2: 22 mmol/L (ref 22–32)
Calcium: 7.7 mg/dL — ABNORMAL LOW (ref 8.9–10.3)
Chloride: 107 mmol/L (ref 98–111)
Creatinine, Ser: 0.9 mg/dL (ref 0.44–1.00)
GFR calc Af Amer: >60 mL/min (ref >60)
GFR calc non Af Amer: >60 mL/min (ref >60)
Glucose, Bld: 137 mg/dL — ABNORMAL HIGH (ref 70–99)
Potassium: 4.1 mmol/L (ref 3.5–5.1)
Sodium: 140 mmol/L (ref 135–145)
Total Bilirubin: 2.7 mg/dL — ABNORMAL HIGH (ref 0.3–1.2)
Total Protein: 5.7 g/dL — ABNORMAL LOW (ref 6.5–8.1)

## 2019-11-24 LAB — HEPATITIS PANEL, ACUTE
HCV Ab: NONREACTIVE
Hep A IgM: NONREACTIVE
Hep B C IgM: NONREACTIVE
Hepatitis B Surface Ag: NONREACTIVE

## 2019-11-24 MED ORDER — TRAMADOL HCL 50 MG PO TABS: 50.0000 mg | ORAL_TABLET | Freq: Four times a day (QID) | ORAL | 0 refills | Status: DC | PRN

## 2019-11-24 MED ORDER — INFLUENZA VAC SPLIT QUAD 0.5 ML IM SUSY
0.5000 mL | PREFILLED_SYRINGE | INTRAMUSCULAR | Status: AC
  Administered 2019-11-24: 0.5 mL via INTRAMUSCULAR
  Filled 2019-11-24: qty 0.5

## 2019-11-24 MED ORDER — ONDANSETRON 4 MG PO TBDP: 4.0000 mg | ORAL_TABLET | Freq: Four times a day (QID) | ORAL | 0 refills | Status: DC | PRN

## 2019-11-24 MED ORDER — IBUPROFEN 200 MG PO TABS: 600.0000 mg | ORAL_TABLET | Freq: Four times a day (QID) | ORAL | Status: AC | PRN

## 2019-11-24 NOTE — Progress Notes (Signed)
Discharged pt to home. AVS given and explained. Questions answered to patient's satisfaction. Belongings returned accordingly.

## 2019-11-24 NOTE — Plan of Care (Signed)
  Problem: Activity: Goal: Risk for activity intolerance will decrease Outcome: Progressing   Problem: Nutrition: Goal: Adequate nutrition will be maintained Outcome: Progressing   Problem: Pain Managment: Goal: General experience of comfort will improve Outcome: Progressing   

## 2019-11-24 NOTE — Discharge Summary (Signed)
[facility] Discharge Summary   Patient ID: [name] MRN: [id number] DOB/AGE: 1976/08/24 44 y.o.  Admit date: 11/22/2019 Discharge date: 11/24/2019  Admitting Diagnosis: Acute cholecystitis  Discharge Diagnosis Acute cholecysyitis  Hepatomegaly  Consultants None   Imaging: DG Cholangiogram Operative  Result Date: 11/23/2019 CLINICAL DATA:  Cholangiogram EXAM: INTRAOPERATIVE CHOLANGIOGRAM TECHNIQUE: Cholangiographic images from the C-arm fluoroscopic device were submitted for interpretation post-operatively. Please see the procedural report for the amount of contrast and the fluoroscopy time utilized. COMPARISON:  11/22/2019 FINDINGS: The patient has undergone intraoperative cholangiogram. Contrast is seen within the biliary tree and common bile duct. Contrast is seen extending into the duodenum without evidence for a clear filling defect. One image was submitted. The total fluoroscopy time was 10 seconds. IMPRESSION: Intraoperative cholangiogram without evidence for a clear filling defect. Electronically Signed   By: Constance Holster M.D.   On: 11/23/2019 17:37   DG Chest Port 1 View  Result Date: 11/22/2019 CLINICAL DATA:  44 year old female with a history of shortness of breath EXAM: PORTABLE CHEST 1 VIEW COMPARISON:  12/02/2011 FINDINGS: Cardiomediastinal silhouette within normal limits. Low lung volumes accentuate the interstitium. No pneumothorax. No pleural effusion. No confluent airspace disease. IMPRESSION: Likely atelectasis with low lung volumes and no evidence of acute cardiopulmonary disease Electronically Signed   By: Corrie Mckusick D.O.   On: 11/22/2019 13:35   US Abdomen Limited RUQ  Result Date: 11/22/2019 CLINICAL DATA:  Right upper quadrant pain since 7 a.m. EXAM: ULTRASOUND ABDOMEN LIMITED RIGHT UPPER QUADRANT COMPARISON:  CT abdomen and pelvis from 20/12 FINDINGS: Gallbladder: Gallbladder under distended, wall thickness at upper limits of normal.  Sludge and/or calculi in the dependent gallbladder. No reported sonographic Murphy sign. Common bile duct: Diameter: 2.8 Liver: No focal lesion identified. Within normal limits in parenchymal echogenicity. Portal vein is patent on color Doppler imaging with normal direction of blood flow towards the liver. Other: None. IMPRESSION: 1. Signs of sludge and/or small stones in the gallbladder without sonographic evidence of acute cholecystitis. Electronically Signed   By: Zetta Bills M.D.   On: 11/22/2019 14:58    Procedures Dr. Kieth Brightly (11/24/19) - Laparoscopic Cholecystectomy with [place] Hospital Course:  Patient is a 44 year old female who presented to [facility] with abdominal pain.  Workup showed early acute cholecystitis.  Patient was admitted and underwent procedure listed above.  Tolerated procedure well and was transferred to the floor. There was some concern for possible hepatitis intra-operatively so hepatitis panel sent post-operatively and patient's PCP notified.  Diet was advanced as tolerated.  On POD#1, the patient was voiding well, tolerating diet, ambulating well, pain well controlled, vital signs stable, incisions c/d/i and felt stable for discharge home.  Patient will follow up in our office in 2-3 weeks and knows to call with questions or concerns. She will call to confirm appointment date/time.    Physical Exam: General:  Alert, NAD, pleasant, comfortable Abd:  Soft, ND, mild tenderness, incisions C/D/I  I have personally looked this patient up in the [place] Controlled Substance Database and reviewed their medications.  Allergies as of 11/24/2019      Reactions   Other Rash   Burns skin -- Cannot use Hypofix or Transpore. Only Paper Tape      Medication List    STOP taking these medications   acetaminophen 500 MG tablet Commonly known as: TYLENOL   gabapentin 300 MG capsule Commonly known as: Neurontin     TAKE these medications  hydrochlorothiazide 12.5 MG tablet Commonly  known as: HYDRODIURIL Take 12.5 mg by mouth daily.   ibuprofen 200 MG tablet Commonly known as: Motrin IB Take 3 tablets (600 mg total) by mouth every 6 (six) hours as needed for headache or mild pain.   ondansetron 4 MG disintegrating tablet Commonly known as: ZOFRAN-ODT Take 1 tablet (4 mg total) by mouth every 6 (six) hours as needed for nausea.   pantoprazole 20 MG tablet Commonly known as: PROTONIX Take 1 tablet (20 mg total) by mouth daily for 14 days.   traMADol 50 MG tablet Commonly known as: Ultram Take 1 tablet (50 mg total) by mouth every 6 (six) hours as needed.        Follow-up Information    Surgery, [place]. Go on 12/16/2019.   Specialty: General Surgery Why: Follow up appointment scheduled for 8:45 AM. Please arrive 30 min prior to appointment time. Bring photo ID and insurance information.  Contact information: [address] [place] [address] [phone]        Margaree Mackintosh, MD. Call.   Specialty: Internal Medicine Why: Call for results of hepatitis panel in 3-4 days. You may not need to schedule a follow up appointment for this.  Contact information: [address] [place] [address] [phone]           Signed: Wells Guiles, [facility] 11/24/2019, 9:28 AM Please see Amion for pager number during day hours 7:00am-4:30pm

## 2019-11-24 NOTE — Progress Notes (Signed)
She has not been seen here in years and will need to re-establish as a new patient. Please let her know that. Thanks.

## 2019-11-24 NOTE — Progress Notes (Signed)
She has not been seen here in years and will need to re-establish as a new patient. Please let her know that.

## 2019-11-24 NOTE — Discharge Instructions (Signed)
[facility] LAPAROSCOPIC SURGERY: POST OP INSTRUCTIONS Always review your discharge instruction sheet given to you by the facility where your surgery was performed. IF YOU HAVE DISABILITY OR FAMILY LEAVE FORMS, YOU MUST BRING THEM TO THE OFFICE FOR PROCESSING.   DO NOT GIVE THEM TO YOUR DOCTOR.  PAIN CONTROL  1. First take acetaminophen (Tylenol) AND/or ibuprofen (Advil) to control your pain after surgery.  Follow directions on package.  Taking acetaminophen (Tylenol) and/or ibuprofen (Advil) regularly after surgery will help to control your pain and lower the amount of prescription pain medication you may need.  You should not take more than 3,000 mg (3 grams) of acetaminophen (Tylenol) in 24 hours.  You should not take ibuprofen (Advil), aleve, motrin, naprosyn or other NSAIDS if you have a history of stomach ulcers or chronic kidney disease.  2. A prescription for pain medication may be given to you upon discharge.  Take your pain medication as prescribed, if you still have uncontrolled pain after taking acetaminophen (Tylenol) or ibuprofen (Advil). 3. Use ice packs to help control pain. 4. If you need a refill on your pain medication, please contact your pharmacy.  They will contact our office to request authorization. Prescriptions will not be filled after 5pm or on week-ends.  HOME MEDICATIONS 5. Take your usually prescribed medications unless otherwise directed.  DIET 6. You should follow a light diet the first few days after arrival home.  Be sure to include lots of fluids daily. Avoid fatty, fried foods.   CONSTIPATION 7. It is common to experience some constipation after surgery and if you are taking pain medication.  Increasing fluid intake and taking a stool softener (such as Colace) will usually help or prevent this problem from occurring.  A mild laxative (Milk of Magnesia or Miralax) should be taken according to package instructions if there are no bowel  movements after 48 hours.  WOUND/INCISION CARE 8. Most patients will experience some swelling and bruising in the area of the incisions.  Ice packs will help.  Swelling and bruising can take several days to resolve.  9. Unless discharge instructions indicate otherwise, follow guidelines below  a. STERI-STRIPS - you may remove your outer bandages 48 hours after surgery, and you may shower at that time.  You have steri-strips (small skin tapes) in place directly over the incision.  These strips should be left on the skin for 7-10 days.   b. DERMABOND/SKIN GLUE - you may shower in 24 hours.  The glue will flake off over the next 2-3 weeks. 10. Any sutures or staples will be removed at the office during your follow-up visit.  ACTIVITIES 11. You may resume regular (light) daily activities beginning the next day--such as daily self-care, walking, climbing stairs--gradually increasing activities as tolerated.  You may have sexual intercourse when it is comfortable.  Refrain from any heavy lifting or straining until approved by your doctor. a. You may drive when you are no longer taking prescription pain medication, you can comfortably wear a seatbelt, and you can safely maneuver your car and apply brakes.  FOLLOW-UP 12. You should see your doctor in the office for a follow-up appointment approximately 2-3 weeks after your surgery.  You should have been given your post-op/follow-up appointment when your surgery was scheduled.  If you did not receive a post-op/follow-up appointment, make sure that you call for this appointment within a day or two after you arrive home to insure a convenient appointment time.  WHEN   TO CALL YOUR DOCTOR: 1. Fever over 101.0 2. Inability to urinate 3. Continued bleeding from incision. 4. Increased pain, redness, or drainage from the incision. 5. Increasing abdominal pain  The clinic staff is available to answer your questions during regular business hours.  Please don't  hesitate to call and ask to speak to one of the nurses for clinical concerns.  If you have a medical emergency, go to the nearest emergency room or call 911.  A surgeon from [facility] is always on call at the hospital. [address], [address], [place], [place]  [address] ? [address] [address], [place], [place]   [address] [phone] ? [phone] ? FAX [phone] Web site: [email or web address]  

## 2019-11-24 NOTE — Telephone Encounter (Signed)
Remove Dr Lenord Fellers as PCP, patient has not been seen since 2012. Dr Lenord Fellers would take back as a patient she would just have to be reestablished.

## 2019-11-25 LAB — SURGICAL PATHOLOGY

## 2019-12-22 ENCOUNTER — Ambulatory Visit: Payer: 59 | Attending: Internal Medicine

## 2019-12-22 DIAGNOSIS — Z23 Encounter for immunization: Secondary | ICD-10-CM

## 2019-12-22 NOTE — Progress Notes (Signed)
   Covid-19 Vaccination Clinic  Name:  [name]    MRN: [id number] DOB: 01-16-1976  12/22/2019  [name] was observed post Covid-19 immunization for 15 minutes without incident. She was provided with Vaccine Information Sheet and instruction to access the V-Safe system.   [name] was instructed to call 911 with any severe reactions post vaccine: Marland Kitchen Difficulty breathing  . Swelling of face and throat  . A fast heartbeat  . A bad rash all over body  . Dizziness and weakness   Immunizations Administered    Name Date Dose VIS Date Route   Pfizer COVID-19 Vaccine 12/22/2019  1:10 PM 0.3 mL 09/05/2019 Intramuscular   Manufacturer: [employer], [employer]   Lot: [id number]   NDC: [address]-0

## 2020-01-13 ENCOUNTER — Ambulatory Visit: Payer: 59 | Attending: Internal Medicine

## 2020-01-13 DIAGNOSIS — Z23 Encounter for immunization: Secondary | ICD-10-CM

## 2020-01-13 NOTE — Progress Notes (Signed)
   Covid-19 Vaccination Clinic  Name:  [name]    MRN: [id number] DOB: 02/02/1976  01/13/2020  [name] was observed post Covid-19 immunization for 15 minutes without incident. She was provided with Vaccine Information Sheet and instruction to access the V-Safe system.   [name] was instructed to call 911 with any severe reactions post vaccine: Marland Kitchen Difficulty breathing  . Swelling of face and throat  . A fast heartbeat  . A bad rash all over body  . Dizziness and weakness   Immunizations Administered    Name Date Dose VIS Date Route   Pfizer COVID-19 Vaccine 01/13/2020  2:05 PM 0.3 mL 11/19/2018 Intramuscular   Manufacturer: [employer], [employer]   Lot: [id number]   NDC: [address]-4

## 2020-06-21 ENCOUNTER — Other Ambulatory Visit: Payer: Self-pay | Admitting: Obstetrics and Gynecology

## 2020-06-21 DIAGNOSIS — Z1231 Encounter for screening mammogram for malignant neoplasm of breast: Secondary | ICD-10-CM

## 2020-07-08 ENCOUNTER — Emergency Department (HOSPITAL_COMMUNITY)
Admission: EM | Admit: 2020-07-08 | Discharge: 2020-07-09 | Disposition: A | Discharge status: Home / Self Care | Payer: No Typology Code available for payment source | Attending: Emergency Medicine | Admitting: Emergency Medicine

## 2020-07-08 ENCOUNTER — Other Ambulatory Visit: Payer: Self-pay

## 2020-07-08 DIAGNOSIS — S61211A Laceration without foreign body of left index finger without damage to nail, initial encounter: Secondary | ICD-10-CM

## 2020-07-08 DIAGNOSIS — Y9389 Activity, other specified: Secondary | ICD-10-CM | POA: Insufficient documentation

## 2020-07-08 DIAGNOSIS — W260XXA Contact with knife, initial encounter: Secondary | ICD-10-CM | POA: Insufficient documentation

## 2020-07-08 DIAGNOSIS — Z23 Encounter for immunization: Secondary | ICD-10-CM | POA: Insufficient documentation

## 2020-07-08 DIAGNOSIS — F17210 Nicotine dependence, cigarettes, uncomplicated: Secondary | ICD-10-CM | POA: Diagnosis not present

## 2020-07-08 NOTE — ED Triage Notes (Signed)
Pt was cutting up onions and sliced her left index finger with a pairing knife. Pt does not know when tetnus was last given to her. Bleeding is controlled.

## 2020-07-09 ENCOUNTER — Other Ambulatory Visit: Payer: Self-pay

## 2020-07-09 MED ORDER — LIDOCAINE HCL 2 % IJ SOLN
10.0000 mL | Freq: Once | INTRAMUSCULAR | Status: AC
  Administered 2020-07-09: 200 mg
  Filled 2020-07-09: qty 20

## 2020-07-09 MED ORDER — TETANUS-DIPHTH-ACELL PERTUSSIS 5-2.5-18.5 LF-MCG/0.5 IM SUSP
0.5000 mL | Freq: Once | INTRAMUSCULAR | Status: AC
  Administered 2020-07-09: 0.5 mL via INTRAMUSCULAR
  Filled 2020-07-09: qty 0.5

## 2020-07-09 MED ORDER — OXYCODONE-ACETAMINOPHEN 5-325 MG PO TABS
1.0000 | ORAL_TABLET | Freq: Once | ORAL | Status: AC
  Administered 2020-07-09: 1 via ORAL
  Filled 2020-07-09: qty 1

## 2020-07-09 NOTE — Discharge Instructions (Signed)
Happy Early [name]! Thank you for allowing me to care for you today in the Emergency Department.   You were seen today for a laceration to your left index finger.  Your tetanus was updated today.  This is good for the next 10 years.  Keep the finger clean and dry for the next 24 hours.  Then, you can gently remove the bandage and clean the area with warm water and soap at least once daily.  Then, pat the area dry and apply Neosporin or bacitracin to the area.  You can then use a Band-Aid or bandage to cover the stitches before applying the finger splint.  Please keep the finger splint on at all times until your stitches are removed to avoid ripping out your stitches.  Your stitches need to be removed in 7 days.  You can have them removed by returning to the ER, going to urgent care, or following up with primary care.  Take 650 mg of Tylenol or 600 mg of ibuprofen with food every 6 hours for pain.  You can alternate between these 2 medications every 3 hours if your pain returns.  For instance, you can take Tylenol at noon, followed by a dose of ibuprofen at 3, followed by second dose of Tylenol and 6.  Avoid submerging your hand in dishwater, bathtubs, hot tubs, or other bodies of water until your stitches have been removed and the wound is healed.  Return to the emergency department if you develop fevers, chills, if your finger gets red, hot, swollen, if you have red streaking up the finger or hand, or if you develop thick, mucus-like drainage from the wound, or other new, concerning symptoms.

## 2020-07-09 NOTE — ED Provider Notes (Signed)
[facility] EMERGENCY DEPARTMENT Provider Note   CSN: [id number] Arrival date & time: 07/08/20  2334     History Chief Complaint  Patient presents with  . Extremity Laceration    left index finger    [name] is a 44 y.o. female with a history of anemia who presents the emergency department with a chief complaint of laceration.  The patient reports that she sustained a laceration to her left index finger just prior to arrival after she cut her finger with a knife that she was using to slice up onions.  She denies numbness or weakness.  Bleeding was controlled with a dressing.  Initially, she reported no pain associated with injury, but she reports pain has been worsening since onset.  Pain is worse with movement of the digit.  No other known aggravating or alleviating factors.  No treatment prior to arrival.  She is unsure when her Tdap was last updated.  The history is provided by the patient and medical records. No language interpreter was used.       Past Medical History:  Diagnosis Date  . Acid reflux   . Anemia   . Ulcer     Patient Active Problem List   Diagnosis Date Noted  . Acute cholecystitis due to biliary calculus 11/22/2019  . Enlarged lymph nodes in armpit 08/01/2012    Past Surgical History:  Procedure Laterality Date  . ABDOMINAL SURGERY    . BREAST BIOPSY Right 2013  . CHOLECYSTECTOMY N/A 11/23/2019   Procedure: LAPAROSCOPIC CHOLECYSTECTOMY WITH INTRAOPERATIVE CHOLANGIOGRAM;  Surgeon: Kinsinger, De Blanch, MD;  Location: [facility] OR;  Service: General;  Laterality: N/A;  . COSMETIC SURGERY     tummy tuck  . HERNIA REPAIR    . ROUX-EN-Y PROCEDURE    . TUMOR REMOVAL    . WRIST GANGLION EXCISION       OB History   No obstetric history on file.     Family History  Problem Relation Age of Onset  . Cancer Mother        kidney  . Cancer Paternal Aunt     Social History   Tobacco Use  . Smoking status: Current Some Day  Smoker    Packs/day: 0.25    Years: 25.00    Pack years: 6.25    Types: Cigarettes  . Smokeless tobacco: Never Used  Vaping Use  . Vaping Use: Never used  Substance Use Topics  . Alcohol use: Yes    Alcohol/week: 3.0 standard drinks    Types: 3 Standard drinks or equivalent per week    Comment: occasionally  . Drug use: No    Home Medications Prior to Admission medications   Medication Sig Start Date End Date Taking? Authorizing Provider  hydrochlorothiazide (HYDRODIURIL) 12.5 MG tablet Take 12.5 mg by mouth daily. 03/12/19   [provider]  ibuprofen (MOTRIN IB) 200 MG tablet Take 3 tablets (600 mg total) by mouth every 6 (six) hours as needed for headache or mild pain. 11/24/19 11/23/20  Juliet Rude, PA-C  ondansetron (ZOFRAN-ODT) 4 MG disintegrating tablet Take 1 tablet (4 mg total) by mouth every 6 (six) hours as needed for nausea. 11/24/19   Juliet Rude, PA-C  pantoprazole (PROTONIX) 20 MG tablet Take 1 tablet (20 mg total) by mouth daily for 14 days. 08/05/18 08/19/18  Couture, Cortni S, PA-C  traMADol (ULTRAM) 50 MG tablet Take 1 tablet (50 mg total) by mouth every 6 (six) hours as  needed. 11/24/19 11/23/20  Juliet Rude, PA-C    Allergies    Other  Review of Systems   Review of Systems  Constitutional: Negative for activity change, chills and fever.  Respiratory: Negative for shortness of breath.   Cardiovascular: Negative for chest pain.  Gastrointestinal: Negative for abdominal pain.  Musculoskeletal: Positive for myalgias. Negative for back pain.  Skin: Positive for wound. Negative for color change and rash.  Neurological: Negative for seizures, weakness and numbness.    Physical Exam Updated Vital Signs BP 132/67 (BP Location: Right Arm)   Pulse (!) 116   Temp 98.4 F (36.9 C) (Oral)   Resp 16   SpO2 99%   Physical Exam Vitals and nursing note reviewed.  Constitutional:      General: She is not in acute distress. HENT:     Head:  Normocephalic.  Eyes:     Conjunctiva/sclera: Conjunctivae normal.  Cardiovascular:     Rate and Rhythm: Normal rate and regular rhythm.     Heart sounds: No murmur heard.  No friction rub. No gallop.   Pulmonary:     Effort: Pulmonary effort is normal. No respiratory distress.  Abdominal:     General: There is no distension.     Palpations: Abdomen is soft.     Tenderness: There is no guarding.  Musculoskeletal:     Cervical back: Neck supple.     Right lower leg: No edema.     Left lower leg: No edema.  Skin:    General: Skin is warm.     Findings: No rash.     Comments: There is a 1.5 cm transverse, slightly jagged laceration noted to the palmar surface of the left index finger at the level of the PIP joint.  Mild oozing of blood that resolves with applying pressure.  Full active and passive range of motion of the PIP and DIP joints.  No focal tenderness to palpation along the digit.  Sensation is intact to all 4 distal aspects of the left index finger.  5 of 5 strength against resistance.  Radial pulses are 2+ and symmetric.  Good capillary refill.  Wound is clean.  No evidence of foreign body when the wound is viewed in a bloodless field at the base of the wound.  Neurological:     Mental Status: She is alert.  Psychiatric:        Behavior: Behavior normal.     ED Results / Procedures / Treatments   Labs (all labs ordered are listed, but only abnormal results are displayed) Labs Reviewed - No data to display  EKG None  Radiology No results found.  Procedures .Marland KitchenLaceration Repair  Date/Time: 07/09/2020 5:19 AM Performed by: Barkley Boards, PA-C Authorized by: Barkley Boards, PA-C   Consent:    Consent obtained:  Verbal   Consent given by:  Patient   Risks discussed:  Infection, pain, retained foreign body, tendon damage, vascular damage, need for additional repair and nerve damage   Alternatives discussed:  No treatment Anesthesia (see MAR for exact dosages):      Anesthesia method:  Local infiltration   Local anesthetic:  Lidocaine 2% w/o epi Laceration details:    Location:  Finger   Finger location:  L index finger   Length (cm):  1.5 Repair type:    Repair type:  Simple Pre-procedure details:    Preparation:  Patient was prepped and draped in usual sterile fashion Exploration:    Hemostasis achieved  with:  Direct pressure   Wound exploration: wound explored through full range of motion and entire depth of wound probed and visualized     Wound extent: no areolar tissue violation noted, no fascia violation noted, no foreign bodies/material noted, no muscle damage noted, no nerve damage noted, no tendon damage noted, no underlying fracture noted and no vascular damage noted     Contaminated: no   Treatment:    Area cleansed with:  Saline   Amount of cleaning:  Standard   Visualized foreign bodies/material removed: no   Skin repair:    Repair method:  Sutures   Suture size:  5-0   Suture material:  Prolene   Suture technique: 3 simple interrupted and 1 horizontal mattress.   Number of sutures:  4 Approximation:    Approximation:  Close Post-procedure details:    Dressing:  Sterile dressing and splint for protection   Patient tolerance of procedure:  Tolerated well, no immediate complications   (including critical care time)  Medications Ordered in ED Medications  oxyCODONE-acetaminophen (PERCOCET/ROXICET) 5-325 MG per tablet 1 tablet (1 tablet Oral Given 07/09/20 0312)  Tdap (BOOSTRIX) injection 0.5 mL (0.5 mLs Intramuscular Given 07/09/20 0315)  lidocaine (XYLOCAINE) 2 % (with pres) injection 200 mg (200 mg Infiltration Given 07/09/20 0417)    ED Course  I have reviewed the triage vital signs and the nursing notes.  Pertinent labs & imaging results that were available during my care of the patient were reviewed by me and considered in my medical decision making (see chart for details).    MDM Rules/Calculators/A&P                           Pressure irrigation performed. Wound explored and base of wound visualized in a bloodless field without evidence of foreign body.  Laceration occurred < 8 hours prior to repair which was well tolerated. Tdap updated.  Pt has no comorbidities to effect normal wound healing. Pt discharged  without antibiotics.  Discussed suture home care with patient and answered questions. Pt to follow-up for wound check and suture removal in 7 days; they are to return to the ED sooner for signs of infection. Pt is hemodynamically stable with no complaints prior to dc.   Final Clinical Impression(s) / ED Diagnoses Final diagnoses:  Laceration of left index finger without foreign body without damage to nail, initial encounter    Rx / DC Orders ED Discharge Orders    None       Barkley Boards, PA-C 07/09/20 Ok Edwards, MD 07/09/20 [phone]

## 2020-07-13 IMAGING — MG DIGITAL SCREENING BILATERAL MAMMOGRAM WITH TOMO AND CAD
8 series · 8 of 24 positions shown · non-contrast
Comparison: Previous exam(s).

CLINICAL DATA: Screening.

EXAM:
DIGITAL SCREENING BILATERAL MAMMOGRAM WITH TOMO AND CAD

[R MLO synth-2D]
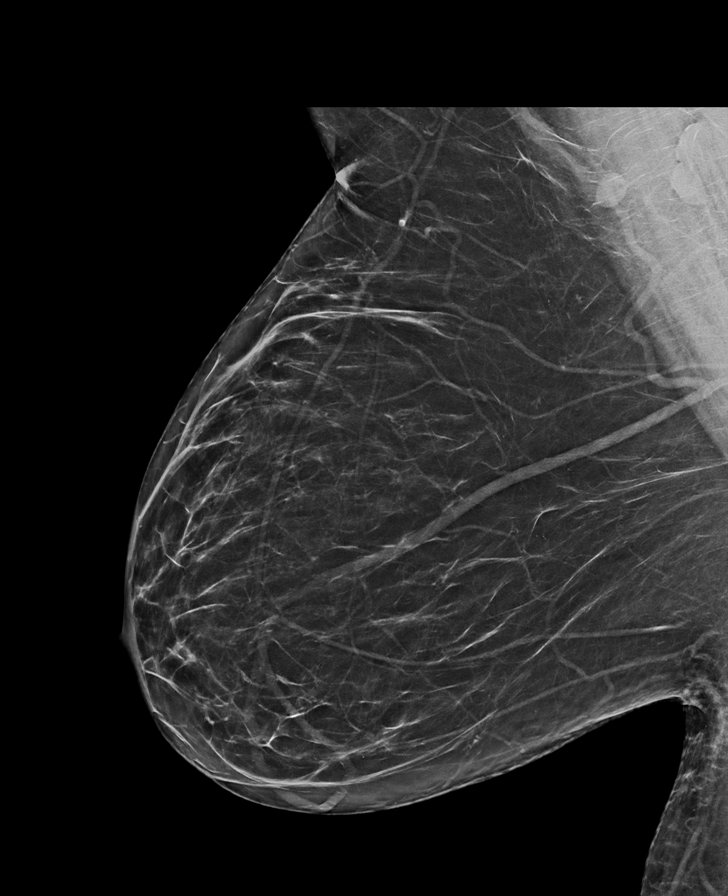

[R CC synth-2D]
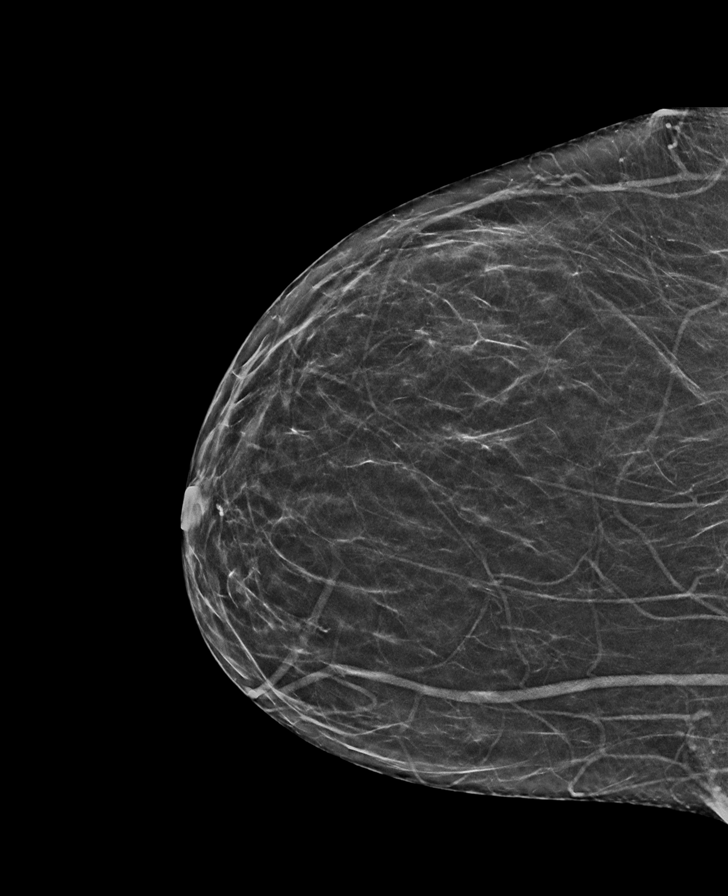

[L MLO synth-2D]
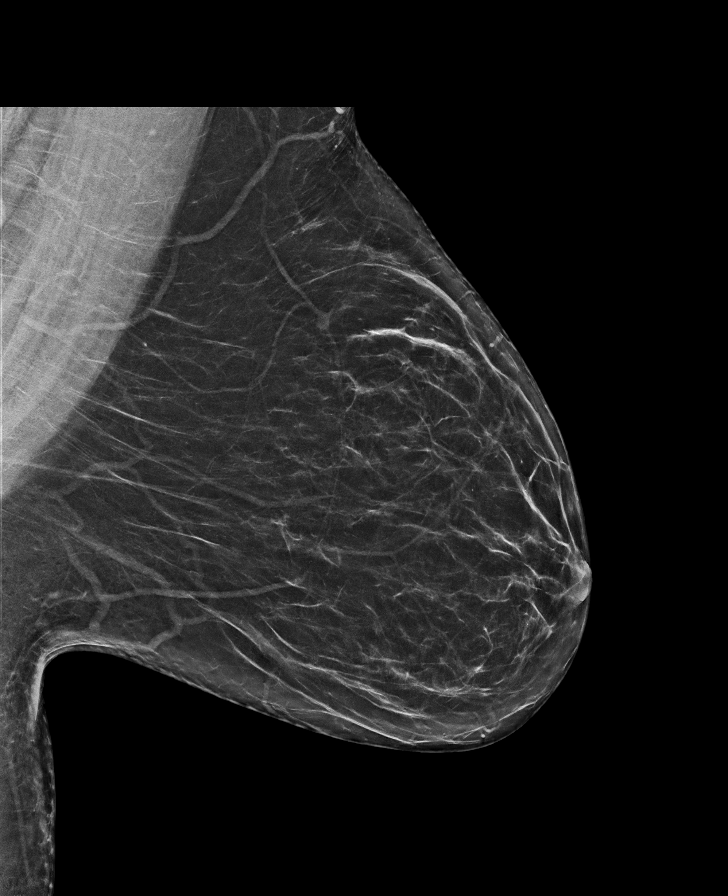

[L CC synth-2D]
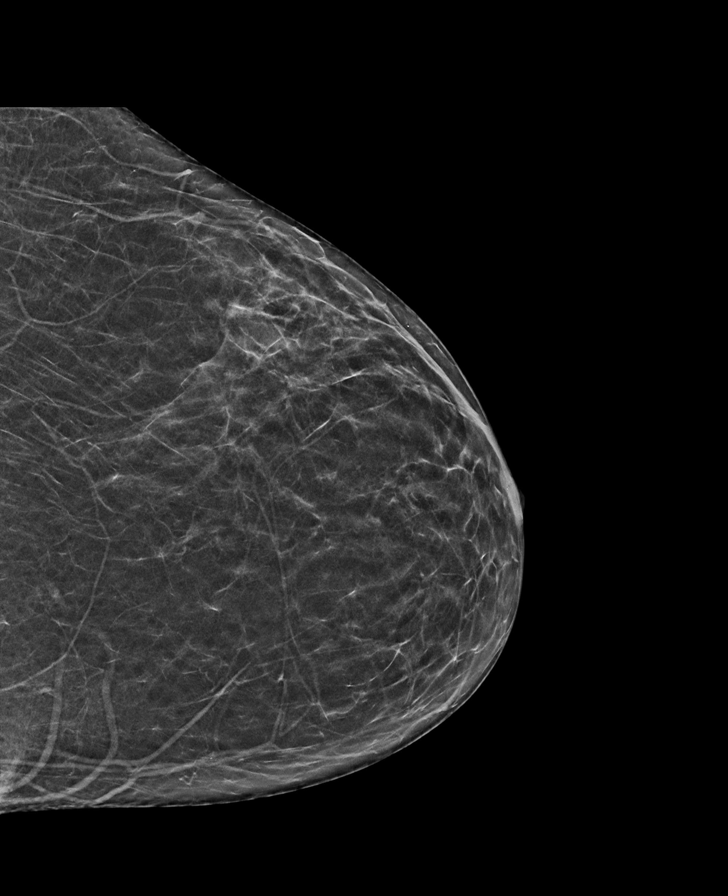

[R CC tomo · tomo slice 29/56.0]
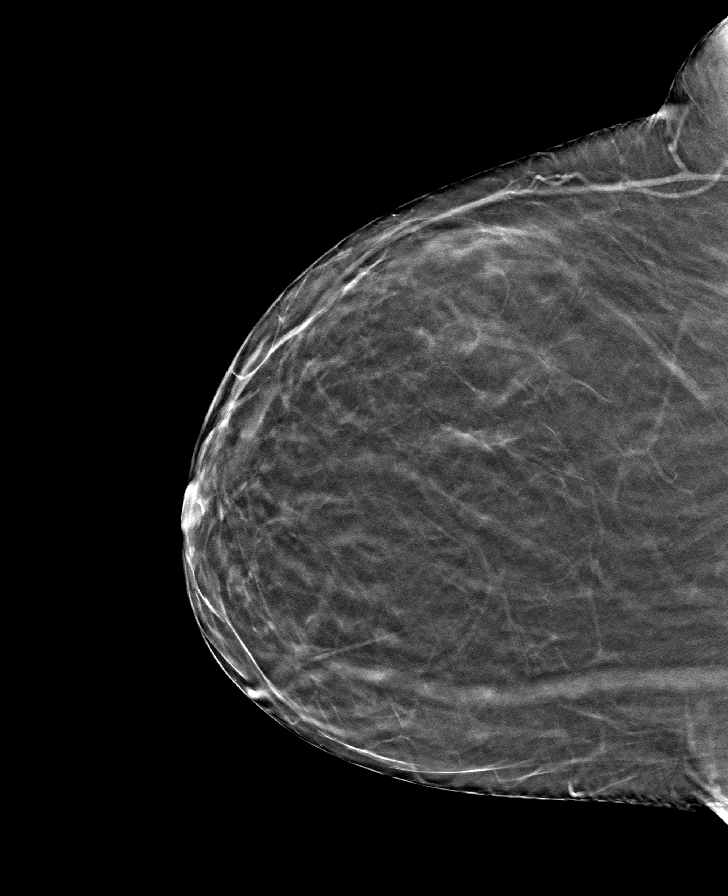

[L MLO tomo · tomo slice 37/73.0]
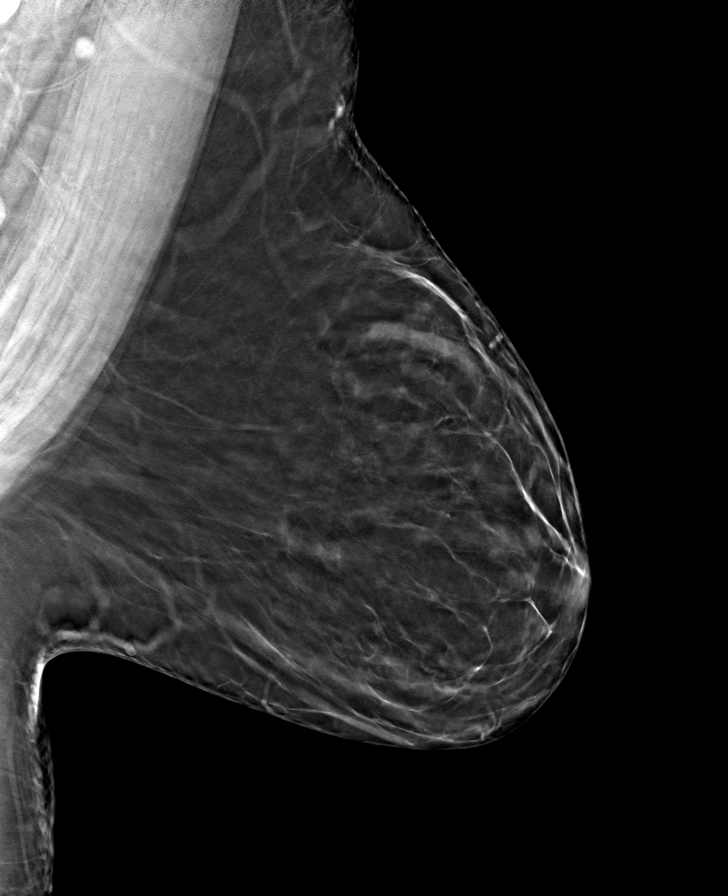

[R MLO tomo · tomo slice 37/74.0]
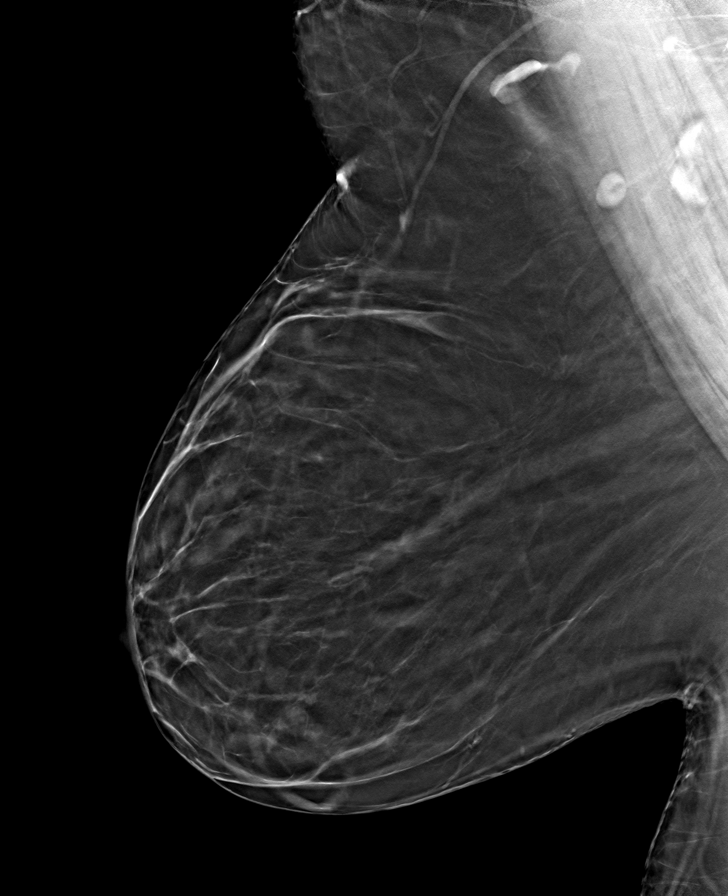

[L CC tomo · tomo slice 28/55.0]
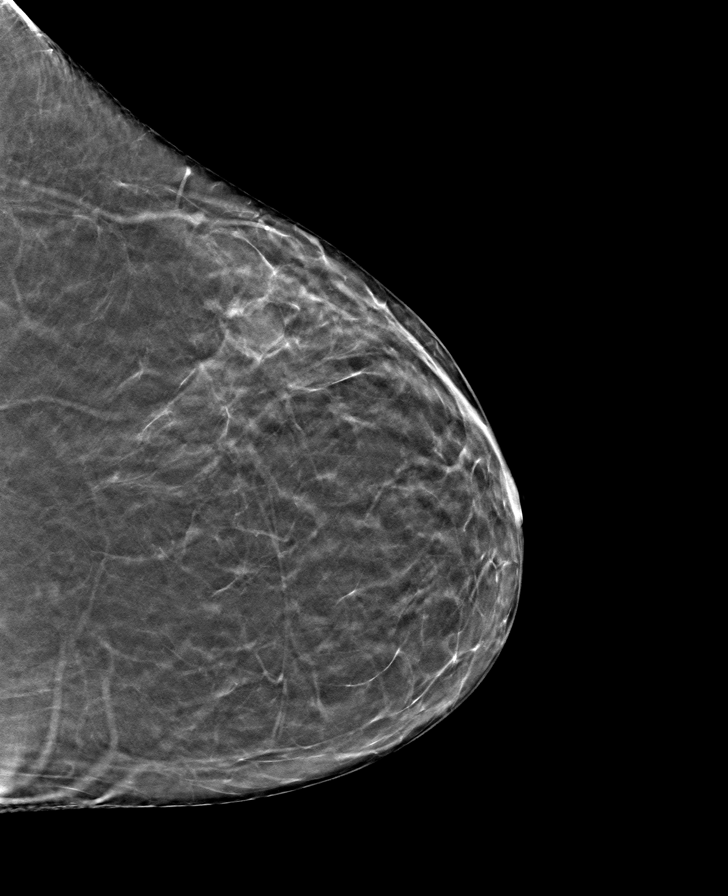

[8 of 24 positions shown; findings below may reference images not displayed]

ACR Breast Density Category b: There are scattered areas of
fibroglandular density.
FINDINGS: There are no findings suspicious for malignancy. Images were
processed with CAD.
IMPRESSION: No mammographic evidence of malignancy. A result letter of this
screening mammogram will be mailed directly to the patient.

RECOMMENDATION:
Screening mammogram in one year. (Code:[id number])

BI-RADS CATEGORY  1: Negative.

## 2020-07-16 ENCOUNTER — Ambulatory Visit (HOSPITAL_COMMUNITY)
Admission: EM | Admit: 2020-07-16 | Discharge: 2020-07-16 | Discharge status: Against Medical Advice | Payer: No Typology Code available for payment source

## 2020-07-16 ENCOUNTER — Other Ambulatory Visit: Payer: Self-pay

## 2020-07-19 ENCOUNTER — Encounter (HOSPITAL_COMMUNITY): Payer: Self-pay | Admitting: Emergency Medicine

## 2020-07-19 ENCOUNTER — Other Ambulatory Visit: Payer: Self-pay

## 2020-07-19 ENCOUNTER — Ambulatory Visit (HOSPITAL_COMMUNITY)
Admission: EM | Admit: 2020-07-19 | Discharge: 2020-07-19 | Disposition: A | Discharge status: Home / Self Care | Payer: No Typology Code available for payment source

## 2020-07-19 NOTE — ED Triage Notes (Signed)
Pt presents for suture removal, she denies any changes. Finger is still tender.

## 2020-07-19 NOTE — Discharge Instructions (Signed)
Keep wound clean and dry.  Look out for any signs of infection as discussed.   Follow up as needed.

## 2020-08-02 ENCOUNTER — Other Ambulatory Visit: Payer: Self-pay

## 2020-08-02 ENCOUNTER — Ambulatory Visit
Admission: RE | Admit: 2020-08-02 | Discharge: 2020-08-02 | Disposition: A | Discharge status: Home / Self Care | Payer: No Typology Code available for payment source | Source: Ambulatory Visit | Attending: Obstetrics and Gynecology | Admitting: Obstetrics and Gynecology

## 2020-08-02 DIAGNOSIS — Z1231 Encounter for screening mammogram for malignant neoplasm of breast: Secondary | ICD-10-CM

## 2020-10-03 ENCOUNTER — Ambulatory Visit (HOSPITAL_COMMUNITY)
Admission: EM | Admit: 2020-10-03 | Discharge: 2020-10-03 | Discharge status: Absent without leave | Payer: No Typology Code available for payment source

## 2020-10-03 ENCOUNTER — Other Ambulatory Visit: Payer: Self-pay

## 2020-10-04 ENCOUNTER — Encounter (HOSPITAL_COMMUNITY): Payer: Self-pay

## 2020-10-04 ENCOUNTER — Ambulatory Visit (HOSPITAL_COMMUNITY)
Admission: EM | Admit: 2020-10-04 | Discharge: 2020-10-04 | Disposition: A | Discharge status: Home / Self Care | Payer: No Typology Code available for payment source | Attending: Family Medicine | Admitting: Family Medicine

## 2020-10-04 VITALS — BP 121/55 | HR 98 | Temp 98.6°F | Resp 17

## 2020-10-04 DIAGNOSIS — G629 Polyneuropathy, unspecified: Secondary | ICD-10-CM

## 2020-10-04 DIAGNOSIS — Z634 Disappearance and death of family member: Secondary | ICD-10-CM | POA: Diagnosis not present

## 2020-10-04 DIAGNOSIS — R600 Localized edema: Secondary | ICD-10-CM

## 2020-10-04 DIAGNOSIS — F4321 Adjustment disorder with depressed mood: Secondary | ICD-10-CM | POA: Diagnosis not present

## 2020-10-04 DIAGNOSIS — Z760 Encounter for issue of repeat prescription: Secondary | ICD-10-CM

## 2020-10-04 DIAGNOSIS — I10 Essential (primary) hypertension: Secondary | ICD-10-CM

## 2020-10-04 MED ORDER — HYDROCHLOROTHIAZIDE 12.5 MG PO TABS: 12.5000 mg | ORAL_TABLET | Freq: Every day | ORAL | 1 refills | Status: DC

## 2020-10-04 MED ORDER — ALPRAZOLAM 0.5 MG PO TABS: 0.5000 mg | ORAL_TABLET | Freq: Two times a day (BID) | ORAL | 0 refills | Status: DC | PRN

## 2020-10-04 MED ORDER — GABAPENTIN 300 MG PO CAPS: 300.0000 mg | ORAL_CAPSULE | Freq: Three times a day (TID) | ORAL | 1 refills | Status: DC

## 2020-10-04 NOTE — ED Provider Notes (Signed)
[facility]    CSN: [id number] Arrival date & time: 10/04/20  4098      History   Chief Complaint Chief Complaint  Patient presents with  . Leg Swelling  . Anxiety    HPI [name] is a 45 y.o. female.   HPI   Patient has pedal edema.  She is out of her hydrochlorothiazide.  She would like to have this refilled.  Not currently have a primary care doctor.  She also would like a refill of Neurontin that she previously took for neuropathy.  She states that she will make an effort to establish with a primary care doctor soon The main reason patient is here is because she is suffering from extreme grief.  She only had 67 child, 77 year old boy, and he was murdered.  He was found last week, 5 days ago.  She states she cannot eat, drink, or sleep.  She cries all the time.  She voices the intention to see a grief counselor.  She has not yet been able to do this.  She is has to go to the funeral home when she leaves here and feels overwhelmed.  She states she does have some family to help her.  Past Medical History:  Diagnosis Date  . Acid reflux   . Anemia   . Ulcer     Patient Active Problem List   Diagnosis Date Noted  . Acute cholecystitis due to biliary calculus 11/22/2019  . Enlarged lymph nodes in armpit 08/01/2012    Past Surgical History:  Procedure Laterality Date  . ABDOMINAL SURGERY    . BREAST BIOPSY Right 2013  . CHOLECYSTECTOMY N/A 11/23/2019   Procedure: LAPAROSCOPIC CHOLECYSTECTOMY WITH INTRAOPERATIVE CHOLANGIOGRAM;  Surgeon: Kinsinger, De Blanch, MD;  Location: [facility];  Service: General;  Laterality: N/A;  . COSMETIC SURGERY     tummy tuck  . HERNIA REPAIR    . ROUX-EN-Y PROCEDURE    . TUMOR REMOVAL    . WRIST GANGLION EXCISION      OB History   No obstetric history on file.      Home Medications    Prior to Admission medications   Medication Sig Start Date End Date Taking? Authorizing Provider  ALPRAZolam Prudy Feeler) 0.5 MG tablet  Take 1 tablet (0.5 mg total) by mouth 2 (two) times daily as needed for anxiety. 10/04/20  Yes Eustace Moore, MD  gabapentin (NEURONTIN) 300 MG capsule Take 1 capsule (300 mg total) by mouth 3 (three) times daily. 10/04/20  Yes Eustace Moore, MD  hydrochlorothiazide (HYDRODIURIL) 12.5 MG tablet Take 1 tablet (12.5 mg total) by mouth daily. 10/04/20   Eustace Moore, MD  ibuprofen (MOTRIN IB) 200 MG tablet Take 3 tablets (600 mg total) by mouth every 6 (six) hours as needed for headache or mild pain. 11/24/19 11/23/20  Juliet Rude, PA-C  pantoprazole (PROTONIX) 20 MG tablet Take 1 tablet (20 mg total) by mouth daily for 14 days. 08/05/18 10/04/20  Couture, Cortni S, PA-C    Family History Family History  Problem Relation Age of Onset  . Cancer Mother        kidney  . Cancer Paternal Aunt     Social History Social History   Tobacco Use  . Smoking status: Current Some Day Smoker    Packs/day: 0.25    Years: 25.00    Pack years: 6.25    Types: Cigarettes  . Smokeless tobacco: Never Used  Vaping Use  .  Vaping Use: Never used  Substance Use Topics  . Alcohol use: Yes    Alcohol/week: 3.0 standard drinks    Types: 3 Standard drinks or equivalent per week    Comment: occasionally  . Drug use: No     Allergies   Other   Review of Systems Review of Systems See HPI  Physical Exam Triage Vital Signs ED Triage Vitals  Enc Vitals Group     BP 10/04/20 0953 (!) 121/55     Pulse Rate 10/04/20 0953 98     Resp 10/04/20 0953 17     Temp 10/04/20 0953 98.6 F (37 C)     Temp Source 10/04/20 0953 Oral     SpO2 10/04/20 0953 100 %     Weight --      Height --      Head Circumference --      Peak Flow --      Pain Score 10/04/20 0952 0     Pain Loc --      Pain Edu? --      Excl. in GC? --    No data found.  Updated Vital Signs BP (!) 121/55 (BP Location: Right Arm)   Pulse 98   Temp 98.6 F (37 C) (Oral)   Resp 17   LMP 09/11/2020 (Approximate)   SpO2  100%   Physical Exam Constitutional:      General: She is in acute distress.     Appearance: She is well-developed and well-nourished.  HENT:     Head: Normocephalic and atraumatic.     Mouth/Throat:     Mouth: Oropharynx is clear and moist.     Comments: Mask is in place Eyes:     Conjunctiva/sclera: Conjunctivae normal.     Pupils: Pupils are equal, round, and reactive to light.  Cardiovascular:     Rate and Rhythm: Normal rate and regular rhythm.     Heart sounds: Normal heart sounds.  Pulmonary:     Effort: Pulmonary effort is normal. No respiratory distress.     Breath sounds: Normal breath sounds.  Abdominal:     General: There is no distension.     Palpations: Abdomen is soft.  Musculoskeletal:        General: Normal range of motion.     Cervical back: Normal range of motion.     Right lower leg: Edema present.     Left lower leg: Edema present.     Comments: 1+ pitting edema  Skin:    General: Skin is warm and dry.  Neurological:     Mental Status: She is alert.  Psychiatric:        Attention and Perception: Attention normal.        Mood and Affect: Mood is depressed. Affect is labile and tearful.        Behavior: Behavior is agitated.        Thought Content: Thought content does not include homicidal or suicidal plan.        Cognition and Memory: Cognition normal.        Judgment: Judgment normal.      UC Treatments / Results  Labs (all labs ordered are listed, but only abnormal results are displayed) Labs Reviewed - No data to display  EKG   Radiology No results found.  Procedures Procedures (including critical care time)  Medications Ordered in UC Medications - No data to display  Initial Impression / Assessment and Plan / UC Course  I have reviewed the triage vital signs and the nursing notes.  Pertinent labs & imaging results that were available during my care of the patient were reviewed by me and considered in my medical decision making  (see chart for details).     Patient states she has pedal edema because of her lack of blood pressure medication.  This will be refilled for her.  She was given the number of family medicine and internal medicine doctors that are taking new patients. I did encourage her to obtain a grief counselor.  If she has difficulty finding 1 she can call the behavioral health urgent care center, she can also try calling the hospice For her grieving reaction I did give her a limited number of Xanax.  We discussed that if taken daily for a long period of time it can cause dependence.  She needs to see how it affects her before she drives or tries to function.  It likely will help her sleep. I expressed sincere condolences on the loss of her son Final Clinical Impressions(s) / UC Diagnoses   Final diagnoses:  Grief at loss of child  Essential hypertension  Neuropathy  Medication refill  Pedal edema     Discharge Instructions     Take Xanax as needed for your overwhelming grief.  Hopefully this helps you sleep Gabapentin is refilled Hydrochlorothiazide is refilled Call about seeing a grief counselor  You also need to establish with a primary care doctor.  I have given you the number of a group taking new patients   ED Prescriptions    Medication Sig Dispense Auth. Provider   hydrochlorothiazide (HYDRODIURIL) 12.5 MG tablet Take 1 tablet (12.5 mg total) by mouth daily. 30 tablet Eustace Moore, MD   gabapentin (NEURONTIN) 300 MG capsule Take 1 capsule (300 mg total) by mouth 3 (three) times daily. 90 capsule Eustace Moore, MD   ALPRAZolam Prudy Feeler) 0.5 MG tablet Take 1 tablet (0.5 mg total) by mouth 2 (two) times daily as needed for anxiety. 20 tablet Eustace Moore, MD     I have reviewed the PDMP during this encounter.   Eustace Moore, MD 10/04/20 [phone]

## 2020-10-04 NOTE — ED Triage Notes (Signed)
Pt presents with bilateral leg swelling x 1 week and anxiety. Pt states she is needing medication that will help her through grief. She states her son passed away.

## 2020-10-04 NOTE — Discharge Instructions (Signed)
Take Xanax as needed for your overwhelming grief.  Hopefully this helps you sleep Gabapentin is refilled Hydrochlorothiazide is refilled Call about seeing a grief counselor  You also need to establish with a primary care doctor.  I have given you the number of a group taking new patients

## 2021-06-28 ENCOUNTER — Other Ambulatory Visit: Payer: Self-pay | Admitting: Internal Medicine

## 2021-06-28 DIAGNOSIS — R945 Abnormal results of liver function studies: Secondary | ICD-10-CM

## 2021-06-29 ENCOUNTER — Telehealth: Payer: Self-pay | Admitting: Physician Assistant

## 2021-06-29 NOTE — Telephone Encounter (Signed)
Scheduled appt per 10/5 referral. Pt is aware of appt date and time.  

## 2021-07-06 ENCOUNTER — Inpatient Hospital Stay: Payer: No Typology Code available for payment source

## 2021-07-06 ENCOUNTER — Other Ambulatory Visit: Payer: Self-pay | Admitting: Internal Medicine

## 2021-07-06 ENCOUNTER — Encounter: Payer: Self-pay | Admitting: *Deleted

## 2021-07-06 ENCOUNTER — Encounter: Payer: Self-pay | Admitting: Physician Assistant

## 2021-07-06 ENCOUNTER — Other Ambulatory Visit: Payer: Self-pay | Admitting: *Deleted

## 2021-07-06 ENCOUNTER — Other Ambulatory Visit: Payer: Self-pay

## 2021-07-06 ENCOUNTER — Other Ambulatory Visit: Payer: Self-pay | Admitting: Psychiatry

## 2021-07-06 ENCOUNTER — Inpatient Hospital Stay: Payer: No Typology Code available for payment source | Attending: Physician Assistant | Admitting: Physician Assistant

## 2021-07-06 VITALS — BP 143/100 | HR 105 | Temp 99.1°F | Resp 18 | Wt 225.9 lb

## 2021-07-06 DIAGNOSIS — Z1231 Encounter for screening mammogram for malignant neoplasm of breast: Secondary | ICD-10-CM

## 2021-07-06 DIAGNOSIS — Z803 Family history of malignant neoplasm of breast: Secondary | ICD-10-CM | POA: Insufficient documentation

## 2021-07-06 DIAGNOSIS — Z8051 Family history of malignant neoplasm of kidney: Secondary | ICD-10-CM | POA: Diagnosis not present

## 2021-07-06 DIAGNOSIS — Z9884 Bariatric surgery status: Secondary | ICD-10-CM | POA: Insufficient documentation

## 2021-07-06 DIAGNOSIS — Z801 Family history of malignant neoplasm of trachea, bronchus and lung: Secondary | ICD-10-CM | POA: Insufficient documentation

## 2021-07-06 DIAGNOSIS — D508 Other iron deficiency anemias: Secondary | ICD-10-CM | POA: Insufficient documentation

## 2021-07-06 DIAGNOSIS — E538 Deficiency of other specified B group vitamins: Secondary | ICD-10-CM | POA: Insufficient documentation

## 2021-07-06 DIAGNOSIS — Z800 Family history of malignant neoplasm of digestive organs: Secondary | ICD-10-CM | POA: Diagnosis not present

## 2021-07-06 DIAGNOSIS — K909 Intestinal malabsorption, unspecified: Secondary | ICD-10-CM | POA: Insufficient documentation

## 2021-07-06 LAB — CBC WITH DIFFERENTIAL (CANCER CENTER ONLY)
Abs Immature Granulocytes: 0.02 10*3/uL (ref 0.00–0.07)
Basophils Absolute: 0.1 10*3/uL (ref 0.0–0.1)
Basophils Relative: 1 %
Eosinophils Absolute: 0.1 10*3/uL (ref 0.0–0.5)
Eosinophils Relative: 2 %
HCT: 33.6 % — ABNORMAL LOW (ref 36.0–46.0)
Hemoglobin: 11.1 g/dL — ABNORMAL LOW (ref 12.0–15.0)
Immature Granulocytes: 0 %
Lymphocytes Relative: 30 %
Lymphs Abs: 2.1 10*3/uL (ref 0.7–4.0)
MCH: 29.5 pg (ref 26.0–34.0)
MCHC: 33 g/dL (ref 30.0–36.0)
MCV: 89.4 fL (ref 80.0–100.0)
Monocytes Absolute: 0.6 10*3/uL (ref 0.1–1.0)
Monocytes Relative: 8 %
Neutro Abs: 4.1 10*3/uL (ref 1.7–7.7)
Neutrophils Relative %: 59 %
Platelet Count: 225 10*3/uL (ref 150–400)
RBC: 3.76 MIL/uL — ABNORMAL LOW (ref 3.87–5.11)
RDW: 18.8 % — ABNORMAL HIGH (ref 11.5–15.5)
WBC Count: 7.1 10*3/uL (ref 4.0–10.5)
nRBC: 0 % (ref 0.0–0.2)

## 2021-07-06 LAB — CMP (CANCER CENTER ONLY)
ALT: 75 U/L — ABNORMAL HIGH (ref 0–44)
AST: 177 U/L (ref 11–38)
Albumin: 3.6 g/dL (ref 3.5–5.0)
Alkaline Phosphatase: 152 U/L — ABNORMAL HIGH (ref 38–126)
Anion gap: 8 (ref 5–15)
BUN: 6 mg/dL (ref 6–20)
CO2: 31 mmol/L (ref 22–32)
Calcium: 9.6 mg/dL (ref 8.9–10.3)
Chloride: 107 mmol/L (ref 98–111)
Creatinine: 0.74 mg/dL (ref 0.44–1.00)
GFR, Estimated: >60 mL/min (ref >60)
Glucose, Bld: 98 mg/dL (ref 70–99)
Potassium: 3.4 mmol/L — ABNORMAL LOW (ref 3.5–5.1)
Sodium: 146 mmol/L — ABNORMAL HIGH (ref 135–145)
Total Bilirubin: 1.3 mg/dL — ABNORMAL HIGH (ref 0.3–1.2)
Total Protein: 8 g/dL (ref 6.5–8.1)

## 2021-07-06 LAB — IRON AND TIBC
Iron: 123 ug/dL (ref 28–170)
Saturation Ratios: 19 % (ref 10.4–31.8)
TIBC: 665 ug/dL — ABNORMAL HIGH (ref 250–450)
UIBC: 542 ug/dL

## 2021-07-06 LAB — RETIC PANEL
Immature Retic Fract: 24.6 % — ABNORMAL HIGH (ref 2.3–15.9)
RBC.: 3.79 MIL/uL — ABNORMAL LOW (ref 3.87–5.11)
Retic Count, Absolute: 96.6 10*3/uL (ref 19.0–186.0)
Retic Ct Pct: 2.6 % (ref 0.4–3.1)
Reticulocyte Hemoglobin: 29.7 pg (ref >27.9)

## 2021-07-06 LAB — FERRITIN: Ferritin: 18 ng/mL (ref 11–307)

## 2021-07-06 NOTE — Progress Notes (Signed)
CRITICAL VALUE STICKER  CRITICAL VALUE: AST [id number]  RECEIVER (on-site recipient of call):Nikki Shantanu Strauch, RN  DATE & TIME NOTIFIED: 07/06/21 1641  MESSENGER (representative from lab): Bjorn Loser  MD NOTIFIED: Georga Kaufmann  TIME OF NOTIFICATION: 1645  RESPONSE:  MD aware

## 2021-07-06 NOTE — Progress Notes (Signed)
[facility] [phone]   [phone]  INITIAL CONSULT NOTE  Patient Care Team: Patient, No Pcp Per (Inactive) as PCP - General (General Practice)  Hematological/Oncological History 1) Labs from [facility], Dr. Dorinda Hill, from [facility]: -11/16/2020: WBC 6.22, Hgb 11.1 (L), MCV 117.7 (H), Plt 172, Ferritin 259, TIBC 231 (L), Iron 162, Iron saturation 70%, Vitamin B12 423, Folate <2.0 (L) -06/23/2021: WBC 6.32, Hhb 11.3 (L), MCV 93.3, Plt 215,TIBC 503 (H), Iron 14 (L), Iron saturation 3% (L), Vitamin B12 378, Folate >20.0  2) 07/06/2021: Establish care with [facility] Hematology[facility]On[facility]  CHIEF COMPLAINTS/PURPOSE OF CONSULTATION:  "Iron deficiency anemia "  HISTORY OF PRESENTING ILLNESS:  [name] 45 y.o. female with medical history significant for acid reflux and history of bariatric surgery. Patient is unaccompanied for this visit.   On exam today, Ms. Portocarrero reports chronic fatigue but adds that she is going through a lot of emotional distress due to death of his son. She notes that there are days she doesn't want to get out of bed. She reports that her appetite is fairly stable. She denies any nausea, vomiting or abdominal pain. She has regular bowel movements without any diarrhea or constipation. She notes have one episode of epistaxis several months ago. Patient continues to have a monthly menstrual cycle every 28 days. She reports that her cycle last 7 days with 3 days of heavy bleeding. She has no other signs of bleeding and denies any easy bruising. Patient denies any fevers, chills, night sweats, shortness of breath, chest pain or cough. She has no other complaints. Rest of the 10 point ROS is below.   MEDICAL HISTORY:  Past Medical History:  Diagnosis Date   Acid reflux    Anemia    Ulcer     SURGICAL HISTORY: Past Surgical History:  Procedure Laterality Date   ABDOMINAL SURGERY     abdominoplasty   BREAST BIOPSY Right  2013   CHOLECYSTECTOMY N/A 11/23/2019   Procedure: LAPAROSCOPIC CHOLECYSTECTOMY WITH INTRAOPERATIVE CHOLANGIOGRAM;  Surgeon: Kinsinger, De Blanch, MD;  Location: [facility];  Service: General;  Laterality: N/A;   COSMETIC SURGERY     tummy tuck   HERNIA REPAIR     ROUX-EN-Y PROCEDURE     TUMOR REMOVAL     WRIST GANGLION EXCISION      SOCIAL HISTORY: Social History   Socioeconomic History   Marital status: Single    Spouse name: Not on file   Number of children: 1   Years of education: Not on file   Highest education level: Not on file  Occupational History    Employer: [employer]    Comment: [employer]  Tobacco Use   Smoking status: Every Day    Packs/day: 0.25    Years: 25.00    Pack years: 6.25    Types: Cigarettes   Smokeless tobacco: Never  Vaping Use   Vaping Use: Never used  Substance and Sexual Activity   Alcohol use: Not Currently    Alcohol/week: 12.0 standard drinks    Types: 12 Glasses of wine per week   Drug use: No   Sexual activity: Not on file  Other Topics Concern   Not on file  Social History Narrative   Not on file   Social Determinants of Health   Financial Resource Strain: Not on file  Food Insecurity: Not on file  Transportation Needs: Not on file  Physical Activity: Not on file  Stress: Not on file  Social  Connections: Not on file  Intimate Partner Violence: Not on file    FAMILY HISTORY: Family History  Problem Relation Age of Onset   Cancer Mother        kidney   Cancer Paternal Aunt    Breast cancer Maternal Aunt    Pancreatic cancer Maternal Aunt    Lung cancer Paternal Uncle     ALLERGIES:  is allergic to other.  MEDICATIONS:  Current Outpatient Medications  Medication Sig Dispense Refill   busPIRone (BUSPAR) 15 MG tablet Take 15 mg by mouth 3 (three) times daily.     gabapentin (NEURONTIN) 300 MG capsule Take 1 capsule (300 mg total) by mouth 3 (three) times daily. 90 capsule 1   sertraline (ZOLOFT) 50 MG tablet Take 50 mg by mouth  daily.     valsartan (DIOVAN) 80 MG tablet Take 80 mg by mouth daily.     ALPRAZolam (XANAX) 0.5 MG tablet Take 1 tablet (0.5 mg total) by mouth 2 (two) times daily as needed for anxiety. (Patient not taking: Reported on 07/06/2021) 20 tablet 0   hydrochlorothiazide (HYDRODIURIL) 12.5 MG tablet Take 1 tablet (12.5 mg total) by mouth daily. (Patient not taking: Reported on 07/06/2021) 30 tablet 1   No current facility-administered medications for this visit.    REVIEW OF SYSTEMS:   Constitutional: ( - ) fevers, ( - )  chills , ( - ) night sweats Eyes: ( - ) blurriness of vision, ( - ) double vision, ( - ) watery eyes Ears, nose, mouth, throat, and face: ( - ) mucositis, ( - ) sore throat Respiratory: ( - ) cough, ( - ) dyspnea, ( - ) wheezes Cardiovascular: ( - ) palpitation, ( - ) chest discomfort, ( - ) lower extremity swelling Gastrointestinal:  ( - ) nausea, ( - ) heartburn, ( - ) change in bowel habits Skin: ( - ) abnormal skin rashes Lymphatics: ( - ) new lymphadenopathy, ( - ) easy bruising Neurological: ( - ) numbness, ( - ) tingling, ( - ) new weaknesses Behavioral/Psych: ( - ) mood change, ( - ) new changes  All other systems were reviewed with the patient and are negative.  PHYSICAL EXAMINATION: ECOG PERFORMANCE STATUS:  1 - Symptomatic but completely ambulatory Vitals:   07/06/21 1425  BP: (!) 143/100  Pulse: (!) 105  Resp: 18  Temp: 99.1 F (37.3 C)  SpO2: 99%   Filed Weights   07/06/21 1425  Weight: 225 lb 14.4 oz (102.5 kg)    GENERAL: well appearing female in NAD  SKIN: skin color, texture, turgor are normal, no rashes or significant lesions EYES: conjunctiva are pink and non-injected, sclera clear OROPHARYNX: no exudate, no erythema; lips, buccal mucosa, and tongue normal  NECK: supple, non-tender LYMPH:  no palpable lymphadenopathy in the cervical, axillary or supraclavicular lymph nodes.  LUNGS: clear to auscultation and percussion with normal breathing  effort HEART: regular rate & rhythm and no murmurs and no lower extremity edema ABDOMEN: soft, non-tender, non-distended, normal bowel sounds Musculoskeletal: no cyanosis of digits and no clubbing  PSYCH: alert & oriented x 3, fluent speech NEURO: no focal motor/sensory deficits  LABORATORY DATA:  I have reviewed the data as listed CBC Latest Ref Rng & Units 07/06/2021 11/24/2019 11/23/2019  WBC 4.0 - 10.5 K/uL 7.1 11.9(H) 15.2(H)  Hemoglobin 12.0 - 15.0 g/dL 11.1(L) 10.1(L) 11.0(L)  Hematocrit 36.0 - 46.0 % 33.6(L) 31.9(L) 34.4(L)  Platelets 150 - 400 K/uL 225 175 197  CMP Latest Ref Rng & Units 07/06/2021 11/24/2019 11/23/2019  Glucose 70 - 99 mg/dL 98 629(B) 93  BUN 6 - 20 mg/dL 6 14 10   Creatinine 0.44 - 1.00 mg/dL 2.84 1.32)  Sodium 135 - 145 mmol/L 146(H) 140 141  Potassium 3.5 - 5.1 mmol/L 3.4(L) 4.1 4.5  Chloride 98 - 111 mmol/L 107 107 108  CO2 22 - 32 mmol/L 31 22 18(L)  Calcium 8.9 - 10.3 mg/dL 9.6 7.7(L) 7.8(L)  Total Protein 6.5 - 8.1 g/dL 8.0 4.40(N) 6.0(L)  Total Bilirubin 0.3 - 1.2 mg/dL 0.2(V) 2.7(H) 3.2(H)  Alkaline Phos 38 - 126 U/L 152(H) 45 62  AST 11 - 38 U/L 177(HH) 295(H) 307(H)  ALT 0 - 44 U/L 75(H) 131(H) 122(H)    ASSESSMENT & PLAN CALEY CIARAMITARO is a 45 y.o. female who presents to the clinic for evaluation for iron deficiency anemia. Discussed likely underlying cause is malabsorption secondary to bariatric surgery and possibly menstrual bleeding. Recommend to proceed with laboratory evaluation today to check CBC, CMP, iron and TIBC, ferritin and retic panel. If there is evidence of iron deficiency, we recommend IV iron infusions.   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 --Labs today to check CBC, CMP, iron and TIBC, ferritin and retic panel.  --We will arrange for IV monoferric x 1 dose if patient requires IV iron repletion.  --RTC in 2 months with repeat labs  #Vitamin B12  deficiency: --PCP has arranged for monthly vitamin B12 injections  #Folate deficiency: --Currently on folic acid 1 mg once daily. --Most recent folate level was check on 06/23/2021 and was within normal limits.  Orders Placed This Encounter  Procedures   CBC with Differential (Cancer Center Only)    Standing Status:   Future    Number of Occurrences:   1    Standing Expiration Date:   07/06/2022   CMP (Cancer Center only)    Standing Status:   Future    Number of Occurrences:   1    Standing Expiration Date:   07/06/2022   Ferritin    Standing Status:   Future    Number of Occurrences:   1    Standing Expiration Date:   07/06/2022   Iron and TIBC    Standing Status:   Future    Number of Occurrences:   1    Standing Expiration Date:   07/06/2022   Retic Panel    Standing Status:   Future    Number of Occurrences:   1    Standing Expiration Date:   07/06/2022    All questions were answered. The patient knows to call the clinic with any problems, questions or concerns.  I have spent a total of 60 minutes minutes of face-to-face and non-face-to-face time, preparing to see the patient, obtaining and/or reviewing separately obtained history, performing a medically appropriate examination, counseling and educating the patient, ordering medications/tests, documenting clinical information in the electronic health record, and care coordination.   09/05/2022, PA-C Department of Hematology/Oncology [facility] at [facility] Phone: [phone]  Patient was seen with Dr. [phone].   I have read the above note and personally examined the patient. I agree with the assessment and plan as noted above.  Briefly Mrs. Klett is a 45 year old female with medical history significant for gastric bypass who presents for evaluation of iron deficiency anemia.  Given that she has undergone bypass surgery she  would not likely respond well to p.o. iron supplementation.   Therefore I do believe she is an excellent candidate for IV iron therapy.  We will set her up for IV iron therapy and see her back approximately 4 to 6 weeks after her last dose.   Ulysees Barns, MD Department of Hematology/Oncology [facility] at [facility] Phone: [phone] Pager: [phone] Email: [email or web address]

## 2021-07-07 ENCOUNTER — Other Ambulatory Visit: Payer: No Typology Code available for payment source

## 2021-07-07 ENCOUNTER — Encounter: Payer: Self-pay | Admitting: Physician Assistant

## 2021-07-07 DIAGNOSIS — D509 Iron deficiency anemia, unspecified: Secondary | ICD-10-CM | POA: Insufficient documentation

## 2021-07-10 ENCOUNTER — Encounter: Payer: Self-pay | Admitting: Physician Assistant

## 2021-07-11 ENCOUNTER — Telehealth: Payer: Self-pay | Admitting: Physician Assistant

## 2021-07-11 NOTE — Telephone Encounter (Signed)
I called [name] to review the labs from 07/06/2021. There is evidence of mild iron deficiency anemia likely secondary to hx of bariatric surgery. Recommend IV iron replacement with IV monoferric x 1 dose. We will schedule in the next 1-2 weeks.   Additionally, patient's liver enzymes continue to be elevated. PCP ordered hepatitis panel which was negative. Patient is scheduled for liver US for tomorrow, 07/12/2021. Patient notes that she has increased her consumption of alcohol. We will defer workup for elevated liver enzymes to her PCP, Dr. Dorinda Hill.

## 2021-07-12 ENCOUNTER — Encounter: Payer: Self-pay | Admitting: Physician Assistant

## 2021-07-12 ENCOUNTER — Telehealth: Payer: Self-pay | Admitting: Physician Assistant

## 2021-07-12 ENCOUNTER — Other Ambulatory Visit: Payer: No Typology Code available for payment source

## 2021-07-12 NOTE — Telephone Encounter (Signed)
Scheduled per 10/17 scheduled message, patient has been called and notified.

## 2021-07-12 NOTE — Progress Notes (Signed)
Pt has been approved in the [employer] program.  The program covers the OOP cost up to a maximum benefit of $2000 per dose from 07/08/21 to 07/07/22.  Pt is responsible for costs above this amount.  If iron deficiency anemia returns within the coverage period, a second dose may be covered and the pt would receive up to an additional $2000, for a maximum of 2 doses during the 12 month enrollment period.  

## 2021-07-21 ENCOUNTER — Other Ambulatory Visit: Payer: Self-pay

## 2021-07-21 ENCOUNTER — Inpatient Hospital Stay: Payer: No Typology Code available for payment source

## 2021-07-21 VITALS — BP 151/95 | HR 97 | Temp 98.6°F | Resp 16 | Wt 227.0 lb

## 2021-07-21 DIAGNOSIS — D508 Other iron deficiency anemias: Secondary | ICD-10-CM

## 2021-07-21 MED ORDER — SODIUM CHLORIDE 0.9 % IV SOLN: Freq: Once | INTRAVENOUS | Status: AC

## 2021-07-21 MED ORDER — LORAZEPAM 2 MG/ML IJ SOLN
0.5000 mg | Freq: Once | INTRAMUSCULAR | Status: AC
  Administered 2021-07-21: 0.5 mg via INTRAVENOUS
  Filled 2021-07-21: qty 1

## 2021-07-21 MED ORDER — SODIUM CHLORIDE 0.9 % IV SOLN
1000.0000 mg | Freq: Once | INTRAVENOUS | Status: AC
  Administered 2021-07-21: 1000 mg via INTRAVENOUS
  Filled 2021-07-21: qty 10

## 2021-07-21 NOTE — Progress Notes (Signed)
Patient presented to infusion today for monoferric iron infusion, discussed side effects, and anaphlactic reaction that could possible occur.  Patient was crying, bp elevated and pulse was 115. Called Georga Kaufmann, patient was seen in the infusion center, IVF ordered, Ativan 0.5mg  ordered and administered. BP and pulse decreased and okay to proceed with monoferric iron infusion per Georga Kaufmann.  Patient verbalized understanding. LTT

## 2021-07-21 NOTE — Patient Instructions (Signed)

## 2021-08-03 ENCOUNTER — Ambulatory Visit
Admission: RE | Admit: 2021-08-03 | Discharge: 2021-08-03 | Disposition: A | Discharge status: Home / Self Care | Payer: No Typology Code available for payment source | Source: Ambulatory Visit | Attending: Internal Medicine | Admitting: Internal Medicine

## 2021-08-03 DIAGNOSIS — Z1231 Encounter for screening mammogram for malignant neoplasm of breast: Secondary | ICD-10-CM

## 2021-09-01 ENCOUNTER — Other Ambulatory Visit: Payer: Self-pay | Admitting: Physician Assistant

## 2021-09-01 DIAGNOSIS — D508 Other iron deficiency anemias: Secondary | ICD-10-CM

## 2021-09-06 ENCOUNTER — Telehealth: Payer: Self-pay | Admitting: Physician Assistant

## 2021-09-06 NOTE — Telephone Encounter (Signed)
Rescheduled 12/14 appointments to 12/28 due to patient's request.

## 2021-09-07 ENCOUNTER — Inpatient Hospital Stay: Payer: No Typology Code available for payment source | Admitting: Physician Assistant

## 2021-09-07 ENCOUNTER — Inpatient Hospital Stay: Payer: No Typology Code available for payment source

## 2021-09-09 ENCOUNTER — Other Ambulatory Visit: Payer: No Typology Code available for payment source

## 2021-09-09 ENCOUNTER — Ambulatory Visit: Payer: No Typology Code available for payment source | Admitting: Physician Assistant

## 2021-09-20 ENCOUNTER — Other Ambulatory Visit: Payer: Self-pay | Admitting: *Deleted

## 2021-09-20 DIAGNOSIS — D508 Other iron deficiency anemias: Secondary | ICD-10-CM

## 2021-09-21 ENCOUNTER — Inpatient Hospital Stay: Payer: No Typology Code available for payment source | Admitting: Physician Assistant

## 2021-09-21 ENCOUNTER — Inpatient Hospital Stay: Payer: No Typology Code available for payment source

## 2021-09-30 ENCOUNTER — Inpatient Hospital Stay: Payer: No Typology Code available for payment source | Attending: Physician Assistant

## 2021-09-30 ENCOUNTER — Inpatient Hospital Stay (HOSPITAL_BASED_OUTPATIENT_CLINIC_OR_DEPARTMENT_OTHER): Payer: No Typology Code available for payment source | Admitting: Physician Assistant

## 2021-09-30 ENCOUNTER — Other Ambulatory Visit: Payer: Self-pay

## 2021-09-30 VITALS — BP 147/97 | HR 109 | Temp 97.7°F | Resp 18 | Wt 230.4 lb

## 2021-09-30 DIAGNOSIS — Z803 Family history of malignant neoplasm of breast: Secondary | ICD-10-CM | POA: Diagnosis not present

## 2021-09-30 DIAGNOSIS — F17210 Nicotine dependence, cigarettes, uncomplicated: Secondary | ICD-10-CM | POA: Diagnosis not present

## 2021-09-30 DIAGNOSIS — K909 Intestinal malabsorption, unspecified: Secondary | ICD-10-CM | POA: Insufficient documentation

## 2021-09-30 DIAGNOSIS — F32A Depression, unspecified: Secondary | ICD-10-CM | POA: Insufficient documentation

## 2021-09-30 DIAGNOSIS — Z801 Family history of malignant neoplasm of trachea, bronchus and lung: Secondary | ICD-10-CM | POA: Diagnosis not present

## 2021-09-30 DIAGNOSIS — D508 Other iron deficiency anemias: Secondary | ICD-10-CM

## 2021-09-30 DIAGNOSIS — R0981 Nasal congestion: Secondary | ICD-10-CM | POA: Diagnosis not present

## 2021-09-30 DIAGNOSIS — Z8051 Family history of malignant neoplasm of kidney: Secondary | ICD-10-CM | POA: Insufficient documentation

## 2021-09-30 DIAGNOSIS — I10 Essential (primary) hypertension: Secondary | ICD-10-CM | POA: Diagnosis not present

## 2021-09-30 DIAGNOSIS — E538 Deficiency of other specified B group vitamins: Secondary | ICD-10-CM | POA: Diagnosis present

## 2021-09-30 DIAGNOSIS — Z79899 Other long term (current) drug therapy: Secondary | ICD-10-CM | POA: Diagnosis not present

## 2021-09-30 DIAGNOSIS — R059 Cough, unspecified: Secondary | ICD-10-CM | POA: Diagnosis not present

## 2021-09-30 LAB — CBC WITH DIFFERENTIAL (CANCER CENTER ONLY)
Abs Immature Granulocytes: 0.02 10*3/uL (ref 0.00–0.07)
Basophils Absolute: 0.1 10*3/uL (ref 0.0–0.1)
Basophils Relative: 1 %
Eosinophils Absolute: 0.2 10*3/uL (ref 0.0–0.5)
Eosinophils Relative: 3 %
HCT: 39.3 % (ref 36.0–46.0)
Hemoglobin: 14.2 g/dL (ref 12.0–15.0)
Immature Granulocytes: 0 %
Lymphocytes Relative: 34 %
Lymphs Abs: 2.4 10*3/uL (ref 0.7–4.0)
MCH: 36.4 pg — ABNORMAL HIGH (ref 26.0–34.0)
MCHC: 36.1 g/dL — ABNORMAL HIGH (ref 30.0–36.0)
MCV: 100.8 fL — ABNORMAL HIGH (ref 80.0–100.0)
Monocytes Absolute: 0.6 10*3/uL (ref 0.1–1.0)
Monocytes Relative: 8 %
Neutro Abs: 3.7 10*3/uL (ref 1.7–7.7)
Neutrophils Relative %: 54 %
Platelet Count: 166 10*3/uL (ref 150–400)
RBC: 3.9 MIL/uL (ref 3.87–5.11)
RDW: 16.4 % — ABNORMAL HIGH (ref 11.5–15.5)
WBC Count: 6.9 10*3/uL (ref 4.0–10.5)
nRBC: 0 % (ref 0.0–0.2)

## 2021-09-30 LAB — IRON AND IRON BINDING CAPACITY (CC-WL,HP ONLY)
Iron: 250 ug/dL — ABNORMAL HIGH (ref 28–170)
Saturation Ratios: 59 % — ABNORMAL HIGH (ref 10.4–31.8)
TIBC: 421 ug/dL (ref 250–450)
UIBC: 171 ug/dL (ref 148–442)

## 2021-09-30 LAB — FERRITIN: Ferritin: 195 ng/mL (ref 11–307)

## 2021-10-01 ENCOUNTER — Encounter: Payer: Self-pay | Admitting: Physician Assistant

## 2021-10-01 NOTE — Progress Notes (Signed)
[place] [phone]   [phone]  PROGRESS NOTE  Patient Care Team: Patient, No Pcp Per (Inactive) as PCP - General (General Practice)  Hematological/Oncological History 1) Labs from [facility], Dr. Cristie Hem, from [place]: -11/16/2020: WBC 6.22, Hgb 11.1 (L), MCV 117.7 (H), Plt 172, Ferritin 259, TIBC 231 (L), Iron 162, Iron saturation 70%, Vitamin B12 423, Folate <2.0 (L) -06/23/2021: WBC 6.32, Hhb 11.3 (L), MCV 93.3, Plt 215,TIBC 503 (H), Iron 14 (L), Iron saturation 3% (L), Vitamin B12 378, Folate >20.0  2) 07/06/2021: Establish care with [facility] Hematology/Oncology  3) 07/21/2021: Received IV monoferric 1000 mg x 1 dose.   CHIEF COMPLAINTS/PURPOSE OF CONSULTATION:  "Iron deficiency anemia "  HISTORY OF PRESENTING ILLNESS:  [name] 46 y.o. female returns for a follow up for iron deficiency anemia.   On exam today, [name] reports that her energy levels are unchanged. She has a lot of fatigue due to the continued stress of her son's death from last year. She adds that she is depressed and isn't sleeping well. She I eating well and her weight is stable. She denies nausea, vomiting or abdominal pain. Her bowel habits are regular without any diarrhea or constipation. She reports sinus congestions with cough for the lat two weeks. She has been taking vitamin C and Theraflu. Her menstrual cycle is stable. She reports that her cycle last 7 days with 3 days of heavy bleeding. She has no other signs of bleeding and denies any easy bruising. Patient denies any fevers, chills, night sweats, shortness of breath or chest pain. She has no other complaints. Rest of the 10 point ROS is below.   MEDICAL HISTORY:  Past Medical History:  Diagnosis Date   Acid reflux    Anemia    Ulcer     SURGICAL HISTORY: Past Surgical History:  Procedure Laterality Date   ABDOMINAL SURGERY     abdominoplasty   BREAST BIOPSY Right 2013    CHOLECYSTECTOMY N/A 11/23/2019   Procedure: LAPAROSCOPIC CHOLECYSTECTOMY WITH INTRAOPERATIVE CHOLANGIOGRAM;  Surgeon: Kinsinger, Arta Bruce, MD;  Location: [place];  Service: General;  Laterality: N/A;   COSMETIC SURGERY     tummy tuck   HERNIA REPAIR     ROUX-EN-Y PROCEDURE     TUMOR REMOVAL     WRIST GANGLION EXCISION      SOCIAL HISTORY: Social History   Socioeconomic History   Marital status: Single    Spouse name: Not on file   Number of children: 1   Years of education: Not on file   Highest education level: Not on file  Occupational History    Employer: [employer]    Comment: [employer]  Tobacco Use   Smoking status: Every Day    Packs/day: 0.25    Years: 25.00    Pack years: 6.25    Types: Cigarettes   Smokeless tobacco: Never  Vaping Use   Vaping Use: Never used  Substance and Sexual Activity   Alcohol use: Not Currently    Alcohol/week: 12.0 standard drinks    Types: 12 Glasses of wine per week   Drug use: No   Sexual activity: Not on file  Other Topics Concern   Not on file  Social History Narrative   Not on file   Social Determinants of Health   Financial Resource Strain: Not on file  Food Insecurity: Not on file  Transportation Needs: Not on file  Physical Activity: Not on file  Stress: Not  on file  Social Connections: Not on file  Intimate Partner Violence: Not on file    FAMILY HISTORY: Family History  Problem Relation Age of Onset   Cancer Mother        kidney   Cancer Paternal Aunt    Breast cancer Maternal Aunt    Pancreatic cancer Maternal Aunt    Lung cancer Paternal Uncle     ALLERGIES:  is allergic to other.  MEDICATIONS:  Current Outpatient Medications  Medication Sig Dispense Refill   busPIRone (BUSPAR) 15 MG tablet Take 15 mg by mouth 3 (three) times daily.     gabapentin (NEURONTIN) 300 MG capsule Take 1 capsule (300 mg total) by mouth 3 (three) times daily. 90 capsule 1   sertraline (ZOLOFT) 50 MG tablet Take 50 mg by mouth daily.      valsartan (DIOVAN) 80 MG tablet Take 80 mg by mouth daily.     ALPRAZolam (XANAX) 0.5 MG tablet Take 1 tablet (0.5 mg total) by mouth 2 (two) times daily as needed for anxiety. 20 tablet 0   hydrochlorothiazide (HYDRODIURIL) 12.5 MG tablet Take 1 tablet (12.5 mg total) by mouth daily. (Patient not taking: Reported on 07/06/2021) 30 tablet 1   No current facility-administered medications for this visit.    REVIEW OF SYSTEMS:   Constitutional: ( - ) fevers, ( - )  chills , ( - ) night sweats Eyes: ( - ) blurriness of vision, ( - ) double vision, ( - ) watery eyes Ears, nose, mouth, throat, and face: ( - ) mucositis, ( - ) sore throat Respiratory: ( - ) cough, ( - ) dyspnea, ( - ) wheezes Cardiovascular: ( - ) palpitation, ( - ) chest discomfort, ( - ) lower extremity swelling Gastrointestinal:  ( - ) nausea, ( - ) heartburn, ( - ) change in bowel habits Skin: ( - ) abnormal skin rashes Lymphatics: ( - ) new lymphadenopathy, ( - ) easy bruising Neurological: ( - ) numbness, ( - ) tingling, ( - ) new weaknesses Behavioral/Psych: ( - ) mood change, ( - ) new changes  All other systems were reviewed with the patient and are negative.  PHYSICAL EXAMINATION: ECOG PERFORMANCE STATUS:  1 - Symptomatic but completely ambulatory Vitals:   09/30/21 1408  BP: (!) 147/97  Pulse: (!) 109  Resp: 18  Temp: 97.7 F (36.5 C)  SpO2: 95%   Filed Weights   09/30/21 1408  Weight: 230 lb 7 oz (104.5 kg)    GENERAL: well appearing female in NAD  SKIN: skin color, texture, turgor are normal, no rashes or significant lesions EYES: conjunctiva are pink and non-injected, sclera clear OROPHARYNX: no exudate, no erythema; lips, buccal mucosa, and tongue normal  NECK: supple, non-tender LUNGS: clear to auscultation and percussion with normal breathing effort HEART: regular rate & rhythm and no murmurs and no lower extremity edema ABDOMEN: soft, non-tender, non-distended, normal bowel  sounds Musculoskeletal: no cyanosis of digits and no clubbing  PSYCH: alert & oriented x 3, fluent speech NEURO: no focal motor/sensory deficits  LABORATORY DATA:  I have reviewed the data as listed CBC Latest Ref Rng & Units 09/30/2021 07/06/2021 11/24/2019  WBC 4.0 - 10.5 K/uL 6.9 7.1 11.9(H)  Hemoglobin 12.0 - 15.0 g/dL 14.2 11.1(L) 10.1(L)  Hematocrit 36.0 - 46.0 % 39.3 33.6(L) 31.9(L)  Platelets 150 - 400 K/uL 166 225 175    CMP Latest Ref Rng & Units 07/06/2021 11/24/2019 11/23/2019  Glucose 70 - 99  mg/dL 98 137(H) 93  BUN 6 - 20 mg/dL 6 14 10   Creatinine 0.44 - 1.00 mg/dL 0.74 0.90 1.47(H)  Sodium 135 - 145 mmol/L 146(H) 140 141  Potassium 3.5 - 5.1 mmol/L 3.4(L) 4.1 4.5  Chloride 98 - 111 mmol/L 107 107 108  CO2 22 - 32 mmol/L 31 22 18(L)  Calcium 8.9 - 10.3 mg/dL 9.6 7.7(L) 7.8(L)  Total Protein 6.5 - 8.1 g/dL 8.0 5.7(L) 6.0(L)  Total Bilirubin 0.3 - 1.2 mg/dL 1.3(H) 2.7(H) 3.2(H)  Alkaline Phos 38 - 126 U/L 152(H) 45 62  AST 11 - 38 U/L 177(HH) 295(H) 307(H)  ALT 0 - 44 U/L 75(H) 131(H) 122(H)    ASSESSMENT & PLAN [name] is a 46 y.o. female returns for a follow up for iron deficiency anemia.   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 --Patient received  IV monoferric x 1 dose on 07/21/2021 repletion.  --Labs today show that anemia has resolved with Hgb of 14.2. Ferritin levels have improved from 18 to 195.  --No additional IV iron is needed at this time.  --RTC in 3 months with repeat labs  #Vitamin B12 deficiency: --She receives monthly vitamin B12 injections  #Folate deficiency: --Currently on folic acid 1 mg once daily. --Most recent folate level was check on 06/23/2021 and was within normal limits.  #Hypertension: --BP is 147/97 today.  --Advised patient to check BP at home and follow up with PCP to determine if changes need to be made for her BP medications.   Orders Placed This  Encounter  Procedures   CBC with Differential ([place] Only)    Standing Status:   Future    Standing Expiration Date:   10/01/2022   Ferritin    Standing Status:   Future    Standing Expiration Date:   10/01/2022   Iron and Iron Binding Capacity ([facility] only)    Standing Status:   Future    Standing Expiration Date:   10/01/2022   Vitamin B12    Standing Status:   Future    Standing Expiration Date:   10/01/2022   Methylmalonic acid, serum    Standing Status:   Future    Standing Expiration Date:   10/01/2022   Folate, Serum    Standing Status:   Future    Standing Expiration Date:   10/01/2022    All questions were answered. The patient knows to call the clinic with any problems, questions or concerns.  I have spent a total of 25 minutes minutes of face-to-face and non-face-to-face time, preparing to see the patient, performing a medically appropriate examination, counseling and educating the patient, documenting clinical information in the electronic health record, and care coordination.   Dede Query, PA-C Department of Hematology/Oncology [place] at [facility] Phone: [phone]

## 2021-12-21 ENCOUNTER — Encounter: Payer: Self-pay | Admitting: Physician Assistant

## 2021-12-27 ENCOUNTER — Other Ambulatory Visit: Payer: Self-pay | Admitting: Physician Assistant

## 2021-12-28 ENCOUNTER — Inpatient Hospital Stay: Payer: Self-pay | Admitting: Physician Assistant

## 2021-12-28 ENCOUNTER — Inpatient Hospital Stay: Payer: Self-pay

## 2022-01-11 ENCOUNTER — Inpatient Hospital Stay: Payer: Self-pay | Admitting: Physician Assistant

## 2022-01-11 ENCOUNTER — Inpatient Hospital Stay: Payer: Self-pay

## 2022-01-18 ENCOUNTER — Inpatient Hospital Stay: Payer: Self-pay | Attending: Physician Assistant

## 2022-01-18 ENCOUNTER — Other Ambulatory Visit: Payer: Self-pay

## 2022-01-18 ENCOUNTER — Inpatient Hospital Stay (HOSPITAL_BASED_OUTPATIENT_CLINIC_OR_DEPARTMENT_OTHER): Payer: Self-pay | Admitting: Physician Assistant

## 2022-01-18 VITALS — BP 138/91 | HR 122 | Temp 98.1°F | Resp 16 | Wt 218.7 lb

## 2022-01-18 DIAGNOSIS — Z79899 Other long term (current) drug therapy: Secondary | ICD-10-CM | POA: Insufficient documentation

## 2022-01-18 DIAGNOSIS — D72829 Elevated white blood cell count, unspecified: Secondary | ICD-10-CM | POA: Insufficient documentation

## 2022-01-18 DIAGNOSIS — E538 Deficiency of other specified B group vitamins: Secondary | ICD-10-CM | POA: Insufficient documentation

## 2022-01-18 DIAGNOSIS — D508 Other iron deficiency anemias: Secondary | ICD-10-CM | POA: Insufficient documentation

## 2022-01-18 DIAGNOSIS — F17210 Nicotine dependence, cigarettes, uncomplicated: Secondary | ICD-10-CM | POA: Insufficient documentation

## 2022-01-18 DIAGNOSIS — Z801 Family history of malignant neoplasm of trachea, bronchus and lung: Secondary | ICD-10-CM | POA: Insufficient documentation

## 2022-01-18 DIAGNOSIS — F32A Depression, unspecified: Secondary | ICD-10-CM | POA: Insufficient documentation

## 2022-01-18 DIAGNOSIS — F419 Anxiety disorder, unspecified: Secondary | ICD-10-CM | POA: Insufficient documentation

## 2022-01-18 DIAGNOSIS — Z8051 Family history of malignant neoplasm of kidney: Secondary | ICD-10-CM | POA: Insufficient documentation

## 2022-01-18 DIAGNOSIS — K909 Intestinal malabsorption, unspecified: Secondary | ICD-10-CM | POA: Insufficient documentation

## 2022-01-18 DIAGNOSIS — Z803 Family history of malignant neoplasm of breast: Secondary | ICD-10-CM | POA: Insufficient documentation

## 2022-01-18 DIAGNOSIS — I10 Essential (primary) hypertension: Secondary | ICD-10-CM | POA: Insufficient documentation

## 2022-01-18 LAB — IRON AND IRON BINDING CAPACITY (CC-WL,HP ONLY)
Iron: 181 ug/dL — ABNORMAL HIGH (ref 28–170)
Saturation Ratios: 48 % — ABNORMAL HIGH (ref 10.4–31.8)
TIBC: 374 ug/dL (ref 250–450)
UIBC: 193 ug/dL

## 2022-01-18 LAB — CBC WITH DIFFERENTIAL (CANCER CENTER ONLY)
Abs Immature Granulocytes: 0.06 10*3/uL (ref 0.00–0.07)
Basophils Absolute: 0.1 10*3/uL (ref 0.0–0.1)
Basophils Relative: 1 %
Eosinophils Absolute: 0.1 10*3/uL (ref 0.0–0.5)
Eosinophils Relative: 0 %
HCT: 38.9 % (ref 36.0–46.0)
Hemoglobin: 13.9 g/dL (ref 12.0–15.0)
Immature Granulocytes: 1 %
Lymphocytes Relative: 14 %
Lymphs Abs: 1.8 10*3/uL (ref 0.7–4.0)
MCH: 38.7 pg — ABNORMAL HIGH (ref 26.0–34.0)
MCHC: 35.7 g/dL (ref 30.0–36.0)
MCV: 108.4 fL — ABNORMAL HIGH (ref 80.0–100.0)
Monocytes Absolute: 0.7 10*3/uL (ref 0.1–1.0)
Monocytes Relative: 5 %
Neutro Abs: 10.2 10*3/uL — ABNORMAL HIGH (ref 1.7–7.7)
Neutrophils Relative %: 79 %
Platelet Count: 190 10*3/uL (ref 150–400)
RBC: 3.59 MIL/uL — ABNORMAL LOW (ref 3.87–5.11)
RDW: 15.4 % (ref 11.5–15.5)
WBC Count: 12.9 10*3/uL — ABNORMAL HIGH (ref 4.0–10.5)
nRBC: 0.2 % (ref 0.0–0.2)

## 2022-01-18 LAB — FOLATE: Folate: 3.2 ng/mL — ABNORMAL LOW (ref >5.9)

## 2022-01-18 LAB — VITAMIN B12: Vitamin B-12: 1136 pg/mL — ABNORMAL HIGH (ref 180–914)

## 2022-01-18 NOTE — Progress Notes (Signed)
?[place] ?[phone]   [phone] ? ?PROGRESS NOTE ? ?Patient Care Team: ?Patient, No Pcp Per (Inactive) as PCP - General (General Practice) ? ?Hematological/Oncological History ?1) Labs from [facility], Dr. Cristie Hem, from [place]: ?-11/16/2020: WBC 6.22, Hgb 11.1 (L), MCV 117.7 (H), Plt 172, Ferritin 259, TIBC 231 (L), Iron 162, Iron saturation 70%, Vitamin B12 423, Folate <2.0 (L) ?-06/23/2021: WBC 6.32, Hhb 11.3 (L), MCV 93.3, Plt 215,TIBC 503 (H), Iron 14 (L), Iron saturation 3% (L), Vitamin B12 378, Folate >20.0 ? ?2) 07/06/2021: Establish care with [facility] Hematology/Oncology ? ?3) 07/21/2021: Received IV monoferric 1000 mg x 1 dose.  ? ?CHIEF COMPLAINTS/PURPOSE OF CONSULTATION:  ?"Iron deficiency anemia " ? ?HISTORY OF PRESENTING ILLNESS:  ?[name] 46 y.o. female returns for a follow up for iron deficiency anemia.  She is unaccompanied for this visit. ? ?At today's visit, [name] has noticed her energy levels have started to decline some since the last visit.  She continues to complete her daily activities on her own but has continued to have a lot of stress surrounding her son's death and other family/friends that have passed away recently.  She is seeing a psychiatrist and therapist routinely for her anxiety and depression. She reports her appetite is stable but her weight osscilates. She denies nausea, vomiting or abdominal pain. Her bowel habits are regular without recurrent episodes of diarrhea or constipation. She had one episode of a nose bleed yesterday that resolved on its own. She denies any other signs of bleeding. Patient denies any fevers, chills, night sweats, shortness of breath or chest pain. She has no other complaints. Rest of the 10 point ROS is below.  ? ?MEDICAL HISTORY:  ?Past Medical History:  ?Diagnosis Date  ? Acid reflux   ? Anemia   ? Ulcer   ? ? ?SURGICAL HISTORY: ?Past Surgical History:  ?Procedure Laterality Date  ?  ABDOMINAL SURGERY    ? abdominoplasty  ? BREAST BIOPSY Right 2013  ? CHOLECYSTECTOMY N/A 11/23/2019  ? Procedure: LAPAROSCOPIC CHOLECYSTECTOMY WITH INTRAOPERATIVE CHOLANGIOGRAM;  Surgeon: Kinsinger, Arta Bruce, MD;  Location: [place];  Service: General;  Laterality: N/A;  ? COSMETIC SURGERY    ? tummy tuck  ? HERNIA REPAIR    ? ROUX-EN-Y PROCEDURE    ? TUMOR REMOVAL    ? WRIST GANGLION EXCISION    ? ? ?SOCIAL HISTORY: ?Social History  ? ?Socioeconomic History  ? Marital status: Single  ?  Spouse name: Not on file  ? Number of children: 1  ? Years of education: Not on file  ? Highest education level: Not on file  ?Occupational History  ?  Employer: [employer]  ?  Comment: [employer]  ?Tobacco Use  ? Smoking status: Every Day  ?  Packs/day: 0.25  ?  Years: 25.00  ?  Pack years: 6.25  ?  Types: Cigarettes  ? Smokeless tobacco: Never  ?Vaping Use  ? Vaping Use: Never used  ?Substance and Sexual Activity  ? Alcohol use: Not Currently  ?  Alcohol/week: 12.0 standard drinks  ?  Types: 12 Glasses of wine per week  ? Drug use: No  ? Sexual activity: Not on file  ?Other Topics Concern  ? Not on file  ?Social History Narrative  ? Not on file  ? ?Social Determinants of Health  ? ?Financial Resource Strain: Not on file  ?Food Insecurity: Not on file  ?Transportation Needs: Not on file  ?Physical Activity: Not  on file  ?Stress: Not on file  ?Social Connections: Not on file  ?Intimate Partner Violence: Not on file  ? ? ?FAMILY HISTORY: ?Family History  ?Problem Relation Age of Onset  ? Cancer Mother   ?     kidney  ? Cancer Paternal Aunt   ? Breast cancer Maternal Aunt   ? Pancreatic cancer Maternal Aunt   ? Lung cancer Paternal Uncle   ? ? ?ALLERGIES:  is allergic to other. ? ?MEDICATIONS:  ?Current Outpatient Medications  ?Medication Sig Dispense Refill  ? busPIRone (BUSPAR) 15 MG tablet Take 15 mg by mouth 3 (three) times daily.    ? gabapentin (NEURONTIN) 300 MG capsule Take 1 capsule (300 mg total) by mouth 3 (three) times daily. 90  capsule 1  ? sertraline (ZOLOFT) 100 MG tablet Take 100 mg by mouth as needed.    ? valsartan (DIOVAN) 80 MG tablet Take 80 mg by mouth daily.    ? ?No current facility-administered medications for this visit.  ? ? ?REVIEW OF SYSTEMS:   ?Constitutional: ( - ) fevers, ( - )  chills , ( - ) night sweats ?Eyes: ( - ) blurriness of vision, ( - ) double vision, ( - ) watery eyes ?Ears, nose, mouth, throat, and face: ( - ) mucositis, ( - ) sore throat ?Respiratory: ( - ) cough, ( - ) dyspnea, ( - ) wheezes ?Cardiovascular: ( - ) palpitation, ( - ) chest discomfort, ( - ) lower extremity swelling ?Gastrointestinal:  ( - ) nausea, ( - ) heartburn, ( - ) change in bowel habits ?Skin: ( - ) abnormal skin rashes ?Lymphatics: ( - ) new lymphadenopathy, ( - ) easy bruising ?Neurological: ( - ) numbness, ( - ) tingling, ( - ) new weaknesses ?Behavioral/Psych: ( - ) mood change, ( - ) new changes  ?All other systems were reviewed with the patient and are negative. ? ?PHYSICAL EXAMINATION: ?ECOG PERFORMANCE STATUS:  ?1 - Symptomatic but completely ambulatory ?Vitals:  ? 01/18/22 1519  ?BP: (!) 138/91  ?Pulse: (!) 122  ?Resp: 16  ?Temp: 98.1 ?F (36.7 ?C)  ?SpO2: 94%  ? ? ?Filed Weights  ? 01/18/22 1519  ?Weight: 218 lb 11.2 oz (99.2 kg)  ? ? ?GENERAL: well appearing female in NAD  ?SKIN: skin color, texture, turgor are normal, no rashes or significant lesions ?EYES: conjunctiva are pink and non-injected, sclera clear ?OROPHARYNX: no exudate, no erythema; lips, buccal mucosa, and tongue normal  ?NECK: supple, non-tender ?LUNGS: clear to auscultation and percussion with normal breathing effort ?HEART: regular rhythm but tachycardic. No murmurs and no lower extremity edema ?ABDOMEN: soft, non-tender, non-distended, normal bowel sounds ?Musculoskeletal: no cyanosis of digits and no clubbing  ?PSYCH: alert & oriented x 3, fluent speech ?NEURO: no focal motor/sensory deficits ? ?LABORATORY DATA:  ?I have reviewed the data as listed ? ?   Latest Ref Rng & Units 01/18/2022  ?  2:57 PM 09/30/2021  ?  1:48 PM 07/06/2021  ?  3:27 PM  ?CBC  ?WBC 4.0 - 10.5 K/uL 12.9   6.9   7.1    ?Hemoglobin 12.0 - 15.0 g/dL 13.9   14.2   11.1    ?Hematocrit 36.0 - 46.0 % 38.9   39.3   33.6    ?Platelets 150 - 400 K/uL 190   166   225    ? ? ? ?  Latest Ref Rng & Units 07/06/2021  ?  3:27 PM 11/24/2019  ?  3:14 AM 11/23/2019  ?  3:53 AM  ?CMP  ?Glucose 70 - 99 mg/dL 98   137   93    ?BUN 6 - 20 mg/dL 6   14   10     ?Creatinine 0.44 - 1.00 mg/dL 0.74   0.90   1.47    ?Sodium 135 - 145 mmol/L 146   140   141    ?Potassium 3.5 - 5.1 mmol/L 3.4   4.1   4.5    ?Chloride 98 - 111 mmol/L 107   107   108    ?CO2 22 - 32 mmol/L 31   22   18     ?Calcium 8.9 - 10.3 mg/dL 9.6   7.7   7.8    ?Total Protein 6.5 - 8.1 g/dL 8.0   5.7   6.0    ?Total Bilirubin 0.3 - 1.2 mg/dL 1.3   2.7   3.2    ?Alkaline Phos 38 - 126 U/L 152   45   62    ?AST 11 - 38 U/L 177  C 295   307    ?ALT 0 - 44 U/L 75   131   122    ?  ?C Corrected result  ? ? ?ASSESSMENT & PLAN ?[name] is a 46 y.o. female returns for a follow up for iron deficiency anemia.  ? ?#Iron deficiency anemia ?--Secondary to malabsorption due to bariatric surgery and possibly menstrual bleeding. ?--Patient received  IV monoferric x 1 dose on 07/21/2021 repletion.  ?--Labs today show that anemia has resolved with Hgb of 13.9. Iron panel shows ferritin 73, iron 181, saturation 48%.  ?--No additional IV iron is needed at this time.  ? ?#Vitamin B12 deficiency: ?--Most recently received vitamin B12 injections in January 2023 ?--Vitamin B12 level is 1136.  ?--Okay to monitor for now.  ? ?#Folate deficiency: ?--Folate level is 3.2 today ?--Recommend to resume folic acid 1 mg once daily.  ? ?Leukocytosis/neutrophilia: ?--Levels oscillate. ?--One possible etiology is cigarette smoking. Encouraged to discontinue ?--Continue to monitor.  ? ?Follow up: ?--RTC in 6 months with repeat labs ? ?No orders of the defined types were placed in this  encounter. ? ? ?All questions were answered. The patient knows to call the clinic with any problems, questions or concerns. ? ?I have spent a total of 25 minutes minutes of face-to-face and non-face-to-face ti

## 2022-01-19 LAB — FERRITIN: Ferritin: 73 ng/mL (ref 11–307)

## 2022-01-20 ENCOUNTER — Telehealth: Payer: Self-pay

## 2022-01-20 ENCOUNTER — Encounter: Payer: Self-pay | Admitting: Physician Assistant

## 2022-01-20 MED ORDER — FOLIC ACID 1 MG PO TABS: 1.0000 mg | ORAL_TABLET | Freq: Every day | ORAL | 6 refills | Status: DC

## 2022-01-20 NOTE — Telephone Encounter (Signed)
-----   Message from Lincoln Brigham, PA-C sent at 01/20/2022  9:32 AM EDT ----- ?Can you notify patient that she still has folate deficiency so I sent a prescription for folic acid supplementation she needs to take once a day. She has no iron or B12 deficiency so no need for IV iron or B12 injections at this time.  ?

## 2022-01-20 NOTE — Telephone Encounter (Signed)
Pt avised and verbalized understanding ?

## 2022-01-22 LAB — METHYLMALONIC ACID, SERUM: Methylmalonic Acid, Quantitative: 193 nmol/L (ref 0–378)

## 2022-04-09 ENCOUNTER — Inpatient Hospital Stay (HOSPITAL_BASED_OUTPATIENT_CLINIC_OR_DEPARTMENT_OTHER)
Admission: EM | Admit: 2022-04-09 | Discharge: 2022-04-22 | DRG: 391 | Disposition: A | Discharge status: Home Health | Payer: Self-pay | Attending: Family Medicine | Admitting: Family Medicine

## 2022-04-09 ENCOUNTER — Encounter (HOSPITAL_BASED_OUTPATIENT_CLINIC_OR_DEPARTMENT_OTHER): Payer: Self-pay | Admitting: Emergency Medicine

## 2022-04-09 ENCOUNTER — Encounter: Payer: Self-pay | Admitting: Physician Assistant

## 2022-04-09 ENCOUNTER — Other Ambulatory Visit: Payer: Self-pay

## 2022-04-09 DIAGNOSIS — E871 Hypo-osmolality and hyponatremia: Secondary | ICD-10-CM | POA: Diagnosis present

## 2022-04-09 DIAGNOSIS — I700 Atherosclerosis of aorta: Secondary | ICD-10-CM | POA: Diagnosis present

## 2022-04-09 DIAGNOSIS — Z801 Family history of malignant neoplasm of trachea, bronchus and lung: Secondary | ICD-10-CM

## 2022-04-09 DIAGNOSIS — K2211 Ulcer of esophagus with bleeding: Secondary | ICD-10-CM | POA: Diagnosis present

## 2022-04-09 DIAGNOSIS — I10 Essential (primary) hypertension: Secondary | ICD-10-CM | POA: Diagnosis present

## 2022-04-09 DIAGNOSIS — R600 Localized edema: Secondary | ICD-10-CM | POA: Diagnosis not present

## 2022-04-09 DIAGNOSIS — E872 Acidosis, unspecified: Secondary | ICD-10-CM

## 2022-04-09 DIAGNOSIS — N179 Acute kidney failure, unspecified: Secondary | ICD-10-CM | POA: Diagnosis present

## 2022-04-09 DIAGNOSIS — E669 Obesity, unspecified: Secondary | ICD-10-CM

## 2022-04-09 DIAGNOSIS — Z79899 Other long term (current) drug therapy: Secondary | ICD-10-CM

## 2022-04-09 DIAGNOSIS — K760 Fatty (change of) liver, not elsewhere classified: Secondary | ICD-10-CM | POA: Diagnosis present

## 2022-04-09 DIAGNOSIS — Z8711 Personal history of peptic ulcer disease: Secondary | ICD-10-CM

## 2022-04-09 DIAGNOSIS — Z6836 Body mass index (BMI) 36.0-36.9, adult: Secondary | ICD-10-CM

## 2022-04-09 DIAGNOSIS — Z91148 Patient's other noncompliance with medication regimen for other reason: Secondary | ICD-10-CM

## 2022-04-09 DIAGNOSIS — F1010 Alcohol abuse, uncomplicated: Secondary | ICD-10-CM | POA: Diagnosis present

## 2022-04-09 DIAGNOSIS — A0811 Acute gastroenteropathy due to Norwalk agent: Principal | ICD-10-CM

## 2022-04-09 DIAGNOSIS — E538 Deficiency of other specified B group vitamins: Secondary | ICD-10-CM | POA: Diagnosis present

## 2022-04-09 DIAGNOSIS — Z597 Insufficient social insurance and welfare support: Secondary | ICD-10-CM

## 2022-04-09 DIAGNOSIS — E876 Hypokalemia: Secondary | ICD-10-CM

## 2022-04-09 DIAGNOSIS — Z9049 Acquired absence of other specified parts of digestive tract: Secondary | ICD-10-CM

## 2022-04-09 DIAGNOSIS — E8809 Other disorders of plasma-protein metabolism, not elsewhere classified: Secondary | ICD-10-CM | POA: Diagnosis present

## 2022-04-09 DIAGNOSIS — Z803 Family history of malignant neoplasm of breast: Secondary | ICD-10-CM

## 2022-04-09 DIAGNOSIS — E8770 Fluid overload, unspecified: Secondary | ICD-10-CM | POA: Diagnosis not present

## 2022-04-09 DIAGNOSIS — B3781 Candidal esophagitis: Secondary | ICD-10-CM | POA: Diagnosis present

## 2022-04-09 DIAGNOSIS — D589 Hereditary hemolytic anemia, unspecified: Secondary | ICD-10-CM | POA: Diagnosis present

## 2022-04-09 DIAGNOSIS — J9601 Acute respiratory failure with hypoxia: Secondary | ICD-10-CM

## 2022-04-09 DIAGNOSIS — M3119 Other thrombotic microangiopathy: Secondary | ICD-10-CM | POA: Diagnosis present

## 2022-04-09 DIAGNOSIS — E8721 Acute metabolic acidosis: Secondary | ICD-10-CM | POA: Diagnosis not present

## 2022-04-09 DIAGNOSIS — Z9884 Bariatric surgery status: Secondary | ICD-10-CM

## 2022-04-09 DIAGNOSIS — K648 Other hemorrhoids: Secondary | ICD-10-CM | POA: Diagnosis present

## 2022-04-09 DIAGNOSIS — Z8051 Family history of malignant neoplasm of kidney: Secondary | ICD-10-CM

## 2022-04-09 DIAGNOSIS — D696 Thrombocytopenia, unspecified: Secondary | ICD-10-CM

## 2022-04-09 DIAGNOSIS — D500 Iron deficiency anemia secondary to blood loss (chronic): Secondary | ICD-10-CM | POA: Diagnosis present

## 2022-04-09 DIAGNOSIS — K859 Acute pancreatitis without necrosis or infection, unspecified: Secondary | ICD-10-CM | POA: Diagnosis present

## 2022-04-09 DIAGNOSIS — J690 Pneumonitis due to inhalation of food and vomit: Secondary | ICD-10-CM

## 2022-04-09 DIAGNOSIS — Z800 Family history of malignant neoplasm of digestive organs: Secondary | ICD-10-CM

## 2022-04-09 DIAGNOSIS — R7989 Other specified abnormal findings of blood chemistry: Secondary | ICD-10-CM | POA: Diagnosis present

## 2022-04-09 DIAGNOSIS — D6959 Other secondary thrombocytopenia: Secondary | ICD-10-CM | POA: Diagnosis present

## 2022-04-09 DIAGNOSIS — L89154 Pressure ulcer of sacral region, stage 4: Secondary | ICD-10-CM

## 2022-04-09 DIAGNOSIS — K767 Hepatorenal syndrome: Secondary | ICD-10-CM

## 2022-04-09 DIAGNOSIS — E861 Hypovolemia: Secondary | ICD-10-CM | POA: Diagnosis present

## 2022-04-09 DIAGNOSIS — K289 Gastrojejunal ulcer, unspecified as acute or chronic, without hemorrhage or perforation: Secondary | ICD-10-CM

## 2022-04-09 DIAGNOSIS — D61818 Other pancytopenia: Secondary | ICD-10-CM | POA: Diagnosis present

## 2022-04-09 DIAGNOSIS — R601 Generalized edema: Secondary | ICD-10-CM

## 2022-04-09 DIAGNOSIS — K2970 Gastritis, unspecified, without bleeding: Secondary | ICD-10-CM | POA: Diagnosis present

## 2022-04-09 DIAGNOSIS — R578 Other shock: Secondary | ICD-10-CM | POA: Diagnosis present

## 2022-04-09 DIAGNOSIS — K7200 Acute and subacute hepatic failure without coma: Secondary | ICD-10-CM | POA: Diagnosis present

## 2022-04-09 DIAGNOSIS — K219 Gastro-esophageal reflux disease without esophagitis: Secondary | ICD-10-CM | POA: Diagnosis present

## 2022-04-09 DIAGNOSIS — D539 Nutritional anemia, unspecified: Secondary | ICD-10-CM | POA: Diagnosis present

## 2022-04-09 DIAGNOSIS — R571 Hypovolemic shock: Secondary | ICD-10-CM

## 2022-04-09 DIAGNOSIS — E860 Dehydration: Secondary | ICD-10-CM | POA: Diagnosis present

## 2022-04-09 DIAGNOSIS — Z980 Intestinal bypass and anastomosis status: Secondary | ICD-10-CM

## 2022-04-09 DIAGNOSIS — K7010 Alcoholic hepatitis without ascites: Secondary | ICD-10-CM

## 2022-04-09 DIAGNOSIS — F17210 Nicotine dependence, cigarettes, uncomplicated: Secondary | ICD-10-CM | POA: Diagnosis present

## 2022-04-09 DIAGNOSIS — K287 Chronic gastrojejunal ulcer without hemorrhage or perforation: Secondary | ICD-10-CM | POA: Diagnosis present

## 2022-04-09 DIAGNOSIS — D649 Anemia, unspecified: Secondary | ICD-10-CM

## 2022-04-09 DIAGNOSIS — K449 Diaphragmatic hernia without obstruction or gangrene: Secondary | ICD-10-CM | POA: Diagnosis present

## 2022-04-09 LAB — CBG MONITORING, ED: Glucose-Capillary: 119 mg/dL — ABNORMAL HIGH (ref 70–99)

## 2022-04-09 LAB — CBC
HCT: 19.6 % — ABNORMAL LOW (ref 36.0–46.0)
Hemoglobin: 7 g/dL — ABNORMAL LOW (ref 12.0–15.0)
MCH: 46.7 pg — ABNORMAL HIGH (ref 26.0–34.0)
MCHC: 35.7 g/dL (ref 30.0–36.0)
MCV: 130.7 fL — ABNORMAL HIGH (ref 80.0–100.0)
Platelets: 118 10*3/uL — ABNORMAL LOW (ref 150–400)
RBC: 1.5 MIL/uL — ABNORMAL LOW (ref 3.87–5.11)
RDW: 16 % — ABNORMAL HIGH (ref 11.5–15.5)
WBC: 11.4 10*3/uL — ABNORMAL HIGH (ref 4.0–10.5)
nRBC: 0.8 % — ABNORMAL HIGH (ref 0.0–0.2)

## 2022-04-09 LAB — BASIC METABOLIC PANEL
Anion gap: 17 — ABNORMAL HIGH (ref 5–15)
BUN: 72 mg/dL — ABNORMAL HIGH (ref 6–20)
CO2: 22 mmol/L (ref 22–32)
Calcium: 9.6 mg/dL (ref 8.9–10.3)
Chloride: 95 mmol/L — ABNORMAL LOW (ref 98–111)
Creatinine, Ser: 6.38 mg/dL — ABNORMAL HIGH (ref 0.44–1.00)
GFR, Estimated: 8 mL/min — ABNORMAL LOW (ref >60)
Glucose, Bld: 124 mg/dL — ABNORMAL HIGH (ref 70–99)
Potassium: 1.9 mmol/L — CL (ref 3.5–5.1)
Sodium: 134 mmol/L — ABNORMAL LOW (ref 135–145)

## 2022-04-09 MED ORDER — POTASSIUM CHLORIDE 10 MEQ/100ML IV SOLN
10.0000 meq | INTRAVENOUS | Status: AC
  Administered 2022-04-09 – 2022-04-10 (×2): 10 meq via INTRAVENOUS
  Filled 2022-04-09 (×2): qty 100

## 2022-04-09 MED ORDER — MAGNESIUM SULFATE 2 GM/50ML IV SOLN
2.0000 g | Freq: Once | INTRAVENOUS | Status: AC
  Administered 2022-04-09: 2 g via INTRAVENOUS
  Filled 2022-04-09: qty 50

## 2022-04-09 MED ORDER — SODIUM CHLORIDE 0.9 % IV BOLUS
1000.0000 mL | Freq: Once | INTRAVENOUS | Status: AC
  Administered 2022-04-09: 1000 mL via INTRAVENOUS

## 2022-04-09 MED ORDER — POTASSIUM CHLORIDE CRYS ER 20 MEQ PO TBCR
40.0000 meq | EXTENDED_RELEASE_TABLET | Freq: Once | ORAL | Status: AC
  Administered 2022-04-09: 40 meq via ORAL
  Filled 2022-04-09: qty 2

## 2022-04-09 NOTE — ED Notes (Signed)
Date and time results received: 04/09/22 2342 (use smartphrase ".now" to insert current time)  Test: potassium Critical Value: 1.9  Name of Provider Notified: C.Horton, MD  Orders Received? Or Actions Taken?:  n/a

## 2022-04-09 NOTE — ED Provider Notes (Incomplete)
[facility] EMERGENCY DEPT Provider Note   CSN: [id number] Arrival date & time: 04/09/22  2219     History {Add pertinent medical, surgical, social history, OB history to HPI:1} Chief Complaint  Patient presents with   Dizziness   Weakness    [name] is a 46 y.o. female.  HPI    This is a 46 year old female with history of anemia who presents with dizziness and lightheadedness.  Patient reports 1 week history of dizziness.  She describes feeling lightheaded when standing.  She denies any room spinning dizziness.  Denies weakness, numbness, tingling or strokelike symptoms.  She denies any recent illnesses, nausea, vomiting.  She denies chest pain or shortness of breath.  She states that she at times will have some rectal bleeding but has been told that it was her hemorrhoids.  No recent significant bleeding.  She gets a monthly period that she describes as "normal" with 2 heavy days of bleeding at the beginning of the period.  She is supposed to be on daily blood pressure medications but states that she has had recent insurance issues and is trying to make her blood pressure medications last.  She is on average taking them every other day. Home Medications Prior to Admission medications   Medication Sig Start Date End Date Taking? Authorizing Provider  busPIRone (BUSPAR) 15 MG tablet Take 15 mg by mouth 3 (three) times daily. 06/09/21   [provider]  folic acid (FOLVITE) 1 MG tablet Take 1 tablet (1 mg total) by mouth daily. 01/20/22   Georga Kaufmann T, PA-C  gabapentin (NEURONTIN) 300 MG capsule Take 1 capsule (300 mg total) by mouth 3 (three) times daily. 10/04/20   Eustace Moore, MD  sertraline (ZOLOFT) 100 MG tablet Take 100 mg by mouth as needed. 06/15/21   [provider]  valsartan (DIOVAN) 80 MG tablet Take 80 mg by mouth daily. 06/27/21   [provider]  pantoprazole (PROTONIX) 20 MG tablet Take 1 tablet (20 mg total) by mouth  daily for 14 days. 08/05/18 10/04/20  Couture, Cortni S, PA-C      Allergies    Other    Review of Systems   Review of Systems  Neurological:  Positive for light-headedness.  All other systems reviewed and are negative.   Physical Exam Updated Vital Signs BP (!) 90/43 (BP Location: Left Arm)   Pulse 80   Temp 98 F (36.7 C)   Resp 18   Ht 1.676 m (5\' 6" )   Wt 99.2 kg   LMP 03/10/2022 (Approximate)   SpO2 100%   BMI 35.30 kg/m  Physical Exam Vitals and nursing note reviewed.  Constitutional:      Appearance: She is well-developed. She is obese. She is not ill-appearing.  HENT:     Head: Normocephalic and atraumatic.     Mouth/Throat:     Mouth: Mucous membranes are moist.  Eyes:     Pupils: Pupils are equal, round, and reactive to light.  Cardiovascular:     Rate and Rhythm: Normal rate and regular rhythm.     Heart sounds: Normal heart sounds.  Pulmonary:     Effort: Pulmonary effort is normal. No respiratory distress.     Breath sounds: No wheezing.  Abdominal:     General: Bowel sounds are normal.     Palpations: Abdomen is soft.     Tenderness: There is no abdominal tenderness.  Musculoskeletal:     Cervical back: Neck supple.  Comments: Cranial nerves II through XII intact  Skin:    General: Skin is warm and dry.  Neurological:     Mental Status: She is alert and oriented to person, place, and time.  Psychiatric:        Mood and Affect: Mood normal.     ED Results / Procedures / Treatments   Labs (all labs ordered are listed, but only abnormal results are displayed) Labs Reviewed  BASIC METABOLIC PANEL  CBC  URINALYSIS, ROUTINE W REFLEX MICROSCOPIC  PREGNANCY, URINE  CBG MONITORING, ED    EKG EKG Interpretation  Date/Time:  Sunday April 09 2022 22:33:38 EDT Ventricular Rate:  83 PR Interval:  154 QRS Duration: 90 QT Interval:  406 QTC Calculation: 477 R Axis:   33 Text Interpretation: Normal sinus rhythm Cannot rule out Anterior  infarct (cited on or before 22-Nov-2019) Abnormal ECG When compared with ECG of 22-Nov-2019 12:25, Vent. rate has decreased BY  67 BPM QRS duration has increased Nonspecific T wave abnormality now evident in Anterolateral leads Confirmed by Ross Marcus ([id number]) on 04/09/2022 10:56:01 PM  Radiology No results found.  Procedures Procedures  {Document cardiac monitor, telemetry assessment procedure when appropriate:1}  Medications Ordered in ED Medications - No data to display  ED Course/ Medical Decision Making/ A&P                           Medical Decision Making Amount and/or Complexity of Data Reviewed Labs: ordered.   ***  {Document critical care time when appropriate:1} {Document review of labs and clinical decision tools ie heart score, Chads2Vasc2 etc:1}  {Document your independent review of radiology images, and any outside records:1} {Document your discussion with family members, caretakers, and with consultants:1} {Document social determinants of health affecting pt's care:1} {Document your decision making why or why not admission, treatments were needed:1} Final Clinical Impression(s) / ED Diagnoses Final diagnoses:  None    Rx / DC Orders ED Discharge Orders     None

## 2022-04-09 NOTE — ED Triage Notes (Signed)
Pt complains of dizziness and weakness x 1 week. BP has been low today.

## 2022-04-10 ENCOUNTER — Inpatient Hospital Stay (HOSPITAL_COMMUNITY): Payer: Self-pay

## 2022-04-10 ENCOUNTER — Emergency Department (HOSPITAL_BASED_OUTPATIENT_CLINIC_OR_DEPARTMENT_OTHER): Payer: Self-pay

## 2022-04-10 ENCOUNTER — Encounter (HOSPITAL_COMMUNITY): Payer: Self-pay | Admitting: Internal Medicine

## 2022-04-10 DIAGNOSIS — D696 Thrombocytopenia, unspecified: Secondary | ICD-10-CM

## 2022-04-10 DIAGNOSIS — M3119 Other thrombotic microangiopathy: Secondary | ICD-10-CM | POA: Diagnosis present

## 2022-04-10 DIAGNOSIS — R011 Cardiac murmur, unspecified: Secondary | ICD-10-CM

## 2022-04-10 DIAGNOSIS — N179 Acute kidney failure, unspecified: Secondary | ICD-10-CM | POA: Diagnosis present

## 2022-04-10 LAB — ECHOCARDIOGRAM COMPLETE
Area-P 1/2: 4.6 cm2
Calc EF: 79.3 %
Height: 66 in
S' Lateral: 2.5 cm
Single Plane A2C EF: 78.7 %
Single Plane A4C EF: 79.4 %
Weight: 3499.14 oz

## 2022-04-10 LAB — BASIC METABOLIC PANEL
Anion gap: 15 (ref 5–15)
BUN: 72 mg/dL — ABNORMAL HIGH (ref 6–20)
CO2: 19 mmol/L — ABNORMAL LOW (ref 22–32)
Calcium: 8.7 mg/dL — ABNORMAL LOW (ref 8.9–10.3)
Chloride: 99 mmol/L (ref 98–111)
Creatinine, Ser: 6.54 mg/dL — ABNORMAL HIGH (ref 0.44–1.00)
GFR, Estimated: 7 mL/min — ABNORMAL LOW (ref >60)
Glucose, Bld: 109 mg/dL — ABNORMAL HIGH (ref 70–99)
Potassium: 2.1 mmol/L — CL (ref 3.5–5.1)
Sodium: 133 mmol/L — ABNORMAL LOW (ref 135–145)

## 2022-04-10 LAB — GASTROINTESTINAL PANEL BY PCR, STOOL (REPLACES STOOL CULTURE)

## 2022-04-10 LAB — PROTIME-INR
INR: 1.8 — ABNORMAL HIGH (ref 0.8–1.2)
Prothrombin Time: 20.8 seconds — ABNORMAL HIGH (ref 11.4–15.2)

## 2022-04-10 LAB — TRIGLYCERIDES: Triglycerides: 191 mg/dL — ABNORMAL HIGH (ref <150)

## 2022-04-10 LAB — CBC WITH DIFFERENTIAL/PLATELET
Abs Immature Granulocytes: 0.08 10*3/uL — ABNORMAL HIGH (ref 0.00–0.07)
Basophils Absolute: 0.1 10*3/uL (ref 0.0–0.1)
Basophils Relative: 1 %
Eosinophils Absolute: 0.2 10*3/uL (ref 0.0–0.5)
Eosinophils Relative: 2 %
HCT: 16.5 % — ABNORMAL LOW (ref 36.0–46.0)
Hemoglobin: 6 g/dL — CL (ref 12.0–15.0)
Immature Granulocytes: 1 %
Lymphocytes Relative: 18 %
Lymphs Abs: 1.8 10*3/uL (ref 0.7–4.0)
MCH: 47.2 pg — ABNORMAL HIGH (ref 26.0–34.0)
MCHC: 36.4 g/dL — ABNORMAL HIGH (ref 30.0–36.0)
MCV: 129.9 fL — ABNORMAL HIGH (ref 80.0–100.0)
Monocytes Absolute: 0.8 10*3/uL (ref 0.1–1.0)
Monocytes Relative: 9 %
Neutro Abs: 6.6 10*3/uL (ref 1.7–7.7)
Neutrophils Relative %: 69 %
Platelets: 82 10*3/uL — ABNORMAL LOW (ref 150–400)
RBC: 1.27 MIL/uL — ABNORMAL LOW (ref 3.87–5.11)
RDW: 15.6 % — ABNORMAL HIGH (ref 11.5–15.5)
Smear Review: DECREASED
WBC: 9.6 10*3/uL (ref 4.0–10.5)
nRBC: 0.6 % — ABNORMAL HIGH (ref 0.0–0.2)

## 2022-04-10 LAB — FERRITIN: Ferritin: 834 ng/mL — ABNORMAL HIGH (ref 11–307)

## 2022-04-10 LAB — LIPASE, BLOOD: Lipase: 267 U/L — ABNORMAL HIGH (ref 11–51)

## 2022-04-10 LAB — HEPATIC FUNCTION PANEL
ALT: 39 U/L (ref 0–44)
AST: 130 U/L — ABNORMAL HIGH (ref 15–41)
Albumin: 3 g/dL — ABNORMAL LOW (ref 3.5–5.0)
Alkaline Phosphatase: 105 U/L (ref 38–126)
Bilirubin, Direct: 5.5 mg/dL — ABNORMAL HIGH (ref 0.0–0.2)
Indirect Bilirubin: 4.7 mg/dL — ABNORMAL HIGH (ref 0.3–0.9)
Total Bilirubin: 10.2 mg/dL — ABNORMAL HIGH (ref 0.3–1.2)
Total Protein: 7.4 g/dL (ref 6.5–8.1)

## 2022-04-10 LAB — LACTATE DEHYDROGENASE: LDH: 217 U/L — ABNORMAL HIGH (ref 98–192)

## 2022-04-10 LAB — GLUCOSE, CAPILLARY: Glucose-Capillary: 123 mg/dL — ABNORMAL HIGH (ref 70–99)

## 2022-04-10 LAB — HCG, SERUM, QUALITATIVE: Preg, Serum: NEGATIVE

## 2022-04-10 LAB — CK: Total CK: 23 U/L — ABNORMAL LOW (ref 38–234)

## 2022-04-10 LAB — RETICULOCYTES
Immature Retic Fract: 22.1 % — ABNORMAL HIGH (ref 2.3–15.9)
RBC.: 1.18 MIL/uL — ABNORMAL LOW (ref 3.87–5.11)
Retic Count, Absolute: 76.6 10*3/uL (ref 19.0–186.0)
Retic Ct Pct: 6.5 % — ABNORMAL HIGH (ref 0.4–3.1)

## 2022-04-10 LAB — SAVE SMEAR(SSMR), FOR PROVIDER SLIDE REVIEW

## 2022-04-10 LAB — POTASSIUM: Potassium: 2.2 mmol/L — CL (ref 3.5–5.1)

## 2022-04-10 LAB — OCCULT BLOOD X 1 CARD TO LAB, STOOL: Fecal Occult Bld: NEGATIVE

## 2022-04-10 LAB — MAGNESIUM: Magnesium: 2.5 mg/dL — ABNORMAL HIGH (ref 1.7–2.4)

## 2022-04-10 LAB — ABO/RH: ABO/RH(D): B POS

## 2022-04-10 LAB — PREPARE RBC (CROSSMATCH)

## 2022-04-10 LAB — LACTIC ACID, PLASMA: Lactic Acid, Venous: 1 mmol/L (ref 0.5–1.9)

## 2022-04-10 LAB — HIV ANTIBODY (ROUTINE TESTING W REFLEX): HIV Screen 4th Generation wRfx: NONREACTIVE

## 2022-04-10 LAB — ETHANOL: Alcohol, Ethyl (B): <10 mg/dL (ref <10)

## 2022-04-10 MED ORDER — SODIUM CHLORIDE 0.9 % IV SOLN: INTRAVENOUS | Status: DC

## 2022-04-10 MED ORDER — POTASSIUM CHLORIDE CRYS ER 20 MEQ PO TBCR: 40.0000 meq | EXTENDED_RELEASE_TABLET | Freq: Once | ORAL | Status: DC

## 2022-04-10 MED ORDER — FOLIC ACID 1 MG PO TABS
2.0000 mg | ORAL_TABLET | Freq: Every day | ORAL | Status: DC
  Administered 2022-04-10 – 2022-04-22 (×13): 2 mg via ORAL
  Filled 2022-04-10 (×13): qty 2

## 2022-04-10 MED ORDER — POTASSIUM CHLORIDE IN NACL 20-0.9 MEQ/L-% IV SOLN
Freq: Once | INTRAVENOUS | Status: AC
  Filled 2022-04-10: qty 1000

## 2022-04-10 MED ORDER — POTASSIUM CHLORIDE CRYS ER 20 MEQ PO TBCR
40.0000 meq | EXTENDED_RELEASE_TABLET | Freq: Once | ORAL | Status: AC
  Administered 2022-04-10: 40 meq via ORAL
  Filled 2022-04-10: qty 2

## 2022-04-10 MED ORDER — HEPARIN SODIUM (PORCINE) 1000 UNIT/ML IJ SOLN
INTRAMUSCULAR | Status: AC
  Filled 2022-04-10: qty 4

## 2022-04-10 MED ORDER — DOCUSATE SODIUM 100 MG PO CAPS: 100.0000 mg | ORAL_CAPSULE | Freq: Two times a day (BID) | ORAL | Status: DC | PRN

## 2022-04-10 MED ORDER — ONDANSETRON HCL 4 MG/2ML IJ SOLN
4.0000 mg | Freq: Four times a day (QID) | INTRAMUSCULAR | Status: DC | PRN
  Administered 2022-04-10 – 2022-04-16 (×7): 4 mg via INTRAVENOUS
  Filled 2022-04-10 (×7): qty 2

## 2022-04-10 MED ORDER — CHLORHEXIDINE GLUCONATE CLOTH 2 % EX PADS
6.0000 | MEDICATED_PAD | Freq: Every day | CUTANEOUS | Status: DC
Start: 2022-04-10 — End: 2022-04-22
  Administered 2022-04-10 – 2022-04-22 (×10): 6 via TOPICAL

## 2022-04-10 MED ORDER — POTASSIUM CHLORIDE 10 MEQ/50ML IV SOLN
10.0000 meq | INTRAVENOUS | Status: AC
  Administered 2022-04-10 (×4): 10 meq via INTRAVENOUS
  Filled 2022-04-10 (×4): qty 50

## 2022-04-10 MED ORDER — FAMOTIDINE 20 MG PO TABS
20.0000 mg | ORAL_TABLET | Freq: Two times a day (BID) | ORAL | Status: DC
  Administered 2022-04-10 – 2022-04-11 (×2): 20 mg via ORAL
  Filled 2022-04-10 (×2): qty 1

## 2022-04-10 MED ORDER — POTASSIUM CHLORIDE CRYS ER 20 MEQ PO TBCR: 40.0000 meq | EXTENDED_RELEASE_TABLET | ORAL | Status: DC

## 2022-04-10 MED ORDER — NOREPINEPHRINE 4 MG/250ML-% IV SOLN
0.0000 ug/min | INTRAVENOUS | Status: DC
  Administered 2022-04-10: 2 ug/min via INTRAVENOUS
  Administered 2022-04-11: 5 ug/min via INTRAVENOUS
  Administered 2022-04-11: 9 ug/min via INTRAVENOUS
  Administered 2022-04-11: 10 ug/min via INTRAVENOUS
  Filled 2022-04-10 (×5): qty 250

## 2022-04-10 MED ORDER — SODIUM CHLORIDE 0.9% IV SOLUTION: Freq: Once | INTRAVENOUS | Status: AC

## 2022-04-10 MED ORDER — ONDANSETRON HCL 4 MG/2ML IJ SOLN
4.0000 mg | Freq: Once | INTRAMUSCULAR | Status: AC
  Administered 2022-04-10: 4 mg via INTRAVENOUS
  Filled 2022-04-10: qty 2

## 2022-04-10 MED ORDER — MIDAZOLAM HCL 2 MG/2ML IJ SOLN
1.0000 mg | Freq: Once | INTRAMUSCULAR | Status: AC
  Administered 2022-04-10: 2 mg via INTRAVENOUS
  Filled 2022-04-10: qty 2

## 2022-04-10 MED ORDER — SODIUM CHLORIDE 0.9 % IV BOLUS
1000.0000 mL | Freq: Once | INTRAVENOUS | Status: AC
  Administered 2022-04-10: 1000 mL via INTRAVENOUS

## 2022-04-10 MED ORDER — POLYETHYLENE GLYCOL 3350 17 G PO PACK: 17.0000 g | PACK | Freq: Every day | ORAL | Status: DC | PRN

## 2022-04-10 NOTE — ED Notes (Signed)
MD aware of vital signs.

## 2022-04-10 NOTE — ED Notes (Signed)
Patient placed on 2LNC at this time due to desaturations while sleeping. RN aware. 

## 2022-04-10 NOTE — Procedures (Signed)
Central Venous Catheter Insertion Procedure Note  [name]  [id number]  23-Jul-1976  Date:04/10/22  Time:3:08 PM   Provider Performing:Floyd Lusignan   Procedure: Insertion of Non-tunneled Central Venous Catheter(36556)with US guidance (22336)    Indication(s) Hemodialysis  Consent Risks of the procedure as well as the alternatives and risks of each were explained to the patient and/or caregiver.  Consent for the procedure was obtained and is signed in the bedside chart  Anesthesia Topical only with 1% lidocaine   Timeout Verified patient identification, verified procedure, site/side was marked, verified correct patient position, special equipment/implants available, medications/allergies/relevant history reviewed, required imaging and test results available.  Sterile Technique Maximal sterile technique including full sterile barrier drape, hand hygiene, sterile gown, sterile gloves, mask, hair covering, sterile ultrasound probe cover (if used).  Procedure Description Area of catheter insertion was cleaned with chlorhexidine and draped in sterile fashion.   With real-time ultrasound guidance a HD catheter was placed into the right internal jugular vein.  Nonpulsatile blood flow and easy flushing noted in all ports.  The catheter was sutured in place and sterile dressing applied.  Complications/Tolerance None; patient tolerated the procedure well. Chest X-ray is ordered to verify placement for internal jugular or subclavian cannulation.  Chest x-ray is not ordered for femoral cannulation.  EBL Minimal  Specimen(s) None

## 2022-04-10 NOTE — Consult Note (Signed)
[facility] Nephrology Consultation Note  Requesting MD:  Reason for consult: AKI, possible TTP  HPI:   [name] is a 46 y.o. person with iron deficiency anemia and hypertension who presents with dizziness and lightheadedness and was found to be hypotensive with BP of 90/43 in the ED initially. Patient denies any recent illness, fevers, chills, chest pain, shortness of breath, abd pain, vomiting, weakness, numbness, tingling, or urinary symptoms. She does endorse some nausea and dry heaving, but has not had any episodes of emesis. Patient states that she gets a monthly period and has not had any changes in bleeding- she describes 2 days of heavy bleeding at the beginning of her period followed by ~5 days of lighter bleeding. First date of LNMP was about 1 month ago. Otherwise, patient denies any signs of bleeding. She does occasionally have rectal bleeding, but has been told this is from hemorrhoids and has not noticed any bleeding recently. She also denies any nosebleeds.   This morning, notable labs includ potassium of 2.1, BUN 72, and Cr of 6.54 with normal renal function at baseline. No new medications and no significant NSAID use. Other labs significant for Hb of 6.0 (previously 13.9 in April), elevated LDH to 217, and elevated indirect bilirubin to 4.7 and elevated direct bili of 5.5.   Creatinine  Date/Time Value Ref Range Status  07/06/2021 03:27 PM 0.74 0.44 - 1.00 mg/dL Final    Comment:    Performed at [facility], [address] [place], [place] [address]   Creatinine, Ser  Date/Time Value Ref Range Status  04/10/2022 09:05 AM 6.54 (H) 0.44 - 1.00 mg/dL Final  93/71/6967 89:38 PM 6.38 (H) 0.44 - 1.00 mg/dL Final  07/11/5101 58:52 AM 0.90 0.44 - 1.00 mg/dL Final  77/82/4235 36:14 AM 1.47 (H) 0.44 - 1.00 mg/dL Final    Comment:    DELTA CHECK NOTED  11/22/2019 12:49 PM 0.68 0.44 - 1.00 mg/dL Final  43/15/4008 67:61 PM 0.76 0.44 - 1.00 mg/dL  Final  95/05/3266 12:45 PM 0.54 0.50 - 1.10 mg/dL Final  80/99/8338 25:05 AM 0.67 0.50 - 1.10 mg/dL Final  39/76/7341 93:79 PM 0.56 0.4 - 1.2 mg/dL Final  02/40/9735 32:99 AM 0.56 0.4 - 1.2 mg/dL Final  24/26/8341 96:22 AM 0.59 0.4 - 1.2 mg/dL Final  29/79/8921 19:41 PM 0.7 0.4 - 1.2 mg/dL Final  74/04/1447 18:56 PM 0.72 0.4 - 1.2 mg/dL Final  31/49/7026 37:85 PM 0.6 0.4 - 1.2 mg/dL Final  88/50/2774 12:87 PM 0.9 0.4 - 1.2 mg/dL Final    PMHx:   Past Medical History:  Diagnosis Date   Acid reflux    Anemia    Ulcer     Past Surgical History:  Procedure Laterality Date   ABDOMINAL SURGERY     abdominoplasty   BREAST BIOPSY Right 2013   CHOLECYSTECTOMY N/A 11/23/2019   Procedure: LAPAROSCOPIC CHOLECYSTECTOMY WITH INTRAOPERATIVE CHOLANGIOGRAM;  Surgeon: Sheliah Hatch De Blanch, MD;  Location: [facility] OR;  Service: General;  Laterality: N/A;   COSMETIC SURGERY     tummy tuck   HERNIA REPAIR     ROUX-EN-Y PROCEDURE     TUMOR REMOVAL     WRIST GANGLION EXCISION      Family Hx:  Family History  Problem Relation Age of Onset   Cancer Mother        kidney   Cancer Paternal Aunt    Breast cancer Maternal Aunt    Pancreatic cancer Maternal Aunt  Lung cancer Paternal Uncle   Negative family history of lupus or autoimmune diseases  Social History:  reports that she has been smoking cigarettes. She has a 6.25 pack-year smoking history. She has never used smokeless tobacco. She reports that she does not currently use alcohol after a past usage of about 12.0 standard drinks of alcohol per week. She reports that she does not use drugs.  Allergies:  Allergies  Allergen Reactions   Other Rash    Burns skin -- Cannot use Hypofix or Transpore. Only Paper Tape    Medications: Prior to Admission medications   Medication Sig Start Date End Date Taking? Authorizing Provider  busPIRone (BUSPAR) 15 MG tablet Take 15 mg by mouth 3 (three) times daily. 06/09/21   [provider]   folic acid (FOLVITE) 1 MG tablet Take 1 tablet (1 mg total) by mouth daily. 01/20/22   Georga Kaufmannhayil, Irene T, PA-C  gabapentin (NEURONTIN) 300 MG capsule Take 1 capsule (300 mg total) by mouth 3 (three) times daily. 10/04/20   Eustace MooreNelson, Yvonne Sue, MD  sertraline (ZOLOFT) 100 MG tablet Take 100 mg by mouth as needed. 06/15/21   [provider]  valsartan (DIOVAN) 80 MG tablet Take 80 mg by mouth daily. 06/27/21   [provider]  pantoprazole (PROTONIX) 20 MG tablet Take 1 tablet (20 mg total) by mouth daily for 14 days. 08/05/18 10/04/20  Couture, Cortni S, PA-C    I have reviewed the patient's current medications.  Labs:  Results for orders placed or performed during the hospital encounter of 04/09/22 (from the past 48 hour(s))  Basic metabolic panel     Status: Abnormal   Collection Time: 04/09/22 10:42 PM  Result Value Ref Range   Sodium 134 (L) 135 - 145 mmol/L   Potassium 1.9 (LL) 3.5 - 5.1 mmol/L    Comment: CRITICAL RESULT CALLED TO, READ BACK BY AND VERIFIED WITH: K,WALINGTON AT 2342 ON 04/09/22 BY A,MOHAMED    Chloride 95 (L) 98 - 111 mmol/L   CO2 22 22 - 32 mmol/L   Glucose, Bld 124 (H) 70 - 99 mg/dL    Comment: Glucose reference range applies only to samples taken after fasting for at least 8 hours.   BUN 72 (H) 6 - 20 mg/dL   Creatinine, Ser 1.196.38 (H) 0.44 - 1.00 mg/dL   Calcium 9.6 8.9 - 14.710.3 mg/dL   GFR, Estimated 8 (L) >60 mL/min    Comment: (NOTE) Calculated using the CKD-EPI Creatinine Equation (2021)    Anion gap 17 (H) 5 - 15    Comment: Performed at [facility], [address], [place], [place] [address]  CBC     Status: Abnormal   Collection Time: 04/09/22 10:42 PM  Result Value Ref Range   WBC 11.4 (H) 4.0 - 10.5 K/uL   RBC 1.50 (L) 3.87 - 5.11 MIL/uL   Hemoglobin 7.0 (L) 12.0 - 15.0 g/dL   HCT 21.319.6 (L) 08.636.0 - 57.846.0 %   MCV 130.7 (H) 80.0 - 100.0 fL   MCH 46.7 (H) 26.0 - 34.0 pg   MCHC 35.7 30.0 - 36.0 g/dL   RDW 46.916.0 (H) 62.911.5 -  15.5 %   Platelets 118 (L) 150 - 400 K/uL    Comment: SPECIMEN CHECKED FOR CLOTS REPEATED TO VERIFY PLATELET COUNT CONFIRMED BY SMEAR    nRBC 0.8 (H) 0.0 - 0.2 %    Comment: Performed at [facility], [address], [place], [place] [address]  Magnesium  Status: Abnormal   Collection Time: 04/09/22 10:42 PM  Result Value Ref Range   Magnesium 2.5 (H) 1.7 - 2.4 mg/dL    Comment: Performed at [facility], [address], [place], [place] [address]  CK     Status: Abnormal   Collection Time: 04/09/22 11:20 PM  Result Value Ref Range   Total CK 23 (L) 38 - 234 U/L    Comment: Performed at [facility], [address] [place], [place] [address]  CBG monitoring, ED     Status: Abnormal   Collection Time: 04/09/22 11:29 PM  Result Value Ref Range   Glucose-Capillary 119 (H) 70 - 99 mg/dL    Comment: Glucose reference range applies only to samples taken after fasting for at least 8 hours.  hCG, serum, qualitative     Status: None   Collection Time: 04/10/22  1:16 AM  Result Value Ref Range   Preg, Serum NEGATIVE NEGATIVE    Comment:        THE SENSITIVITY OF THIS METHODOLOGY IS >10 mIU/mL. Performed at [facility], [address], [place], [place] [address]   Occult blood card to lab, stool     Status: None   Collection Time: 04/10/22  2:34 AM  Result Value Ref Range   Fecal Occult Bld NEGATIVE NEGATIVE    Comment: Performed at [facility], [address] [place], [place] [address]  Hepatic function panel     Status: Abnormal   Collection Time: 04/10/22  2:34 AM  Result Value Ref Range   Total Protein 7.4 6.5 - 8.1 g/dL   Albumin 3.0 (L) 3.5 - 5.0 g/dL   AST 295 (H) 15 - 41 U/L   ALT 39 0 - 44 U/L   Alkaline Phosphatase 105 38 - 126 U/L   Total Bilirubin 10.2 (H) 0.3 - 1.2 mg/dL   Bilirubin, Direct 5.5 (H) 0.0 - 0.2 mg/dL   Indirect Bilirubin 4.7 (H) 0.3 - 0.9 mg/dL     Comment: Performed at [facility], [address] [place], [place] [address]  Ethanol     Status: None   Collection Time: 04/10/22  2:34 AM  Result Value Ref Range   Alcohol, Ethyl (B) <10 <10 mg/dL    Comment: (NOTE) Lowest detectable limit for serum alcohol is 10 mg/dL.  For medical purposes only. Performed at [facility], [address], [place], [place] [address]   HIV Antibody (routine testing w rflx)     Status: None   Collection Time: 04/10/22  2:34 AM  Result Value Ref Range   HIV Screen 4th Generation wRfx Non Reactive Non Reactive    Comment: Performed at [facility], [address] [place], [place] [address]  Lactate dehydrogenase     Status: Abnormal   Collection Time: 04/10/22  2:34 AM  Result Value Ref Range   LDH 217 (H) 98 - 192 U/L    Comment: Performed at [facility], [address] [place], [place] [address]  Lipase, blood     Status: Abnormal   Collection Time: 04/10/22  2:34 AM  Result Value Ref Range   Lipase 267 (H) 11 - 51 U/L    Comment: Performed at [facility], [address], [place], [place] [address]  Basic metabolic panel     Status: Abnormal   Collection Time: 04/10/22  9:05 AM  Result Value Ref Range   Sodium 133 (L) 135 - 145 mmol/L  Potassium 2.1 (LL) 3.5 - 5.1 mmol/L    Comment: CRITICAL RESULT CALLED TO, READ BACK BY AND VERIFIED WITH: CT KAITLYN ZULETA @ 1018. 04/10/2022. KLJ    Chloride 99 98 - 111 mmol/L   CO2 19 (L) 22 - 32 mmol/L   Glucose, Bld 109 (H) 70 - 99 mg/dL    Comment: Glucose reference range applies only to samples taken after fasting for at least 8 hours.   BUN 72 (H) 6 - 20 mg/dL   Creatinine, Ser 0.25 (H) 0.44 - 1.00 mg/dL   Calcium 8.7 (L) 8.9 - 10.3 mg/dL   GFR, Estimated 7 (L) >60 mL/min    Comment: (NOTE) Calculated using the CKD-EPI Creatinine Equation (2021)    Anion gap 15 5 - 15    Comment: Performed at [facility], [address] [place], [place] [address]  CBC with Differential     Status: Abnormal   Collection Time: 04/10/22  9:05 AM  Result Value Ref Range   WBC 9.6 4.0 - 10.5 K/uL   RBC 1.27 (L) 3.87 - 5.11 MIL/uL   Hemoglobin 6.0 (LL) 12.0 - 15.0 g/dL    Comment: REPEATED TO VERIFY THIS CRITICAL RESULT HAS VERIFIED AND BEEN CALLED TO K ZULETA,RN BY DENNIS BRADLEY ON 07 17 2023 AT 0944, AND HAS BEEN READ BACK.     HCT 16.5 (L) 36.0 - 46.0 %   MCV 129.9 (H) 80.0 - 100.0 fL   MCH 47.2 (H) 26.0 - 34.0 pg   MCHC 36.4 (H) 30.0 - 36.0 g/dL   RDW 23.7 (H) 62.8 - 31.5 %   Platelets 82 (L) 150 - 400 K/uL    Comment: Immature Platelet Fraction may be clinically indicated, consider ordering this additional test [id number]    nRBC 0.6 (H) 0.0 - 0.2 %   Neutrophils Relative % 69 %   Neutro Abs 6.6 1.7 - 7.7 K/uL   Lymphocytes Relative 18 %   Lymphs Abs 1.8 0.7 - 4.0 K/uL   Monocytes Relative 9 %   Monocytes Absolute 0.8 0.1 - 1.0 K/uL   Eosinophils Relative 2 %   Eosinophils Absolute 0.2 0.0 - 0.5 K/uL   Basophils Relative 1 %   Basophils Absolute 0.1 0.0 - 0.1 K/uL   WBC Morphology MORPHOLOGY UNREMARKABLE    Smear Review PLATELETS APPEAR DECREASED    Immature Granulocytes 1 %   Abs Immature Granulocytes 0.08 (H) 0.00 - 0.07 K/uL   Target Cells PRESENT    Giant PLTs PRESENT     Comment: Performed at [facility], [address], [place], [place] [address]    ROS:  Pertinent items are noted in HPI.  Physical Exam: Vitals:   04/10/22 1245 04/10/22 1249  BP: (!) 97/48   Pulse: 71   Resp: 13   Temp:  97.7 F (36.5 C)  SpO2: 100%       General exam: Appears calm and comfortable. No acute distress Respiratory system: Normal work of breathing on room air. Clear to ausculation bilaterally. Cardiovascular system: Regular rate, rhythm. S1 & S2 heard. No murmurs.  Gastrointestinal system: Abdomen soft, non-tender, non-distended. Normal bowel  sounds heard. Central nervous system: Alert and oriented. No focal neurological deficits. Extremities: Normal ROM. No pedal edema. Skin: No rashes, lesions or ulcers noted.  Psychiatry: Judgement and insight appear normal. Mood & affect appropriate.   Assessment/Plan:  Acute Kidney Injury Possible TTP Cr elevated to 6.58, with normal renal function noted on labs back in  October. Patient reportedly with no urinary symptoms or changes in urinary frequency, however, she has not been able to give a urine sample yet. With profound AKI, hemolytic anemia, and thrombocytopenia, there is concern for TTP.  - Urinalysis pending - Awaiting INR to calculate Plasmic score to determine if patient needs plasma exchange - Agree with preemptive trialysis catheter placement - ANA, C3, C4, and anti-DS DNA pending  Hypotension Management per primary service.   Hypokalemia Patient does not endorse diarrhea, however, norovirus was detected on GI panel. Suspect hypokalemia is secondary to GI losses. 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 No obvious source of bleeding noted. Anemia is likely secondary to a hemolytic process given elevated LDH and indirect bili. - Transfuse 2u PRBCs - Blood smear pending - Haptoglobin pending - Retic count pending  Thrombocytopenia Platelets low at 82, previously normal on CBC in April. Considering plasma exchange as noted above.    Elza Rafter, DO Internal Medicine Resident PGY-2 Pager: [phone] 04/10/2022, 1:33 PM

## 2022-04-10 NOTE — Progress Notes (Signed)
eLink Physician-Brief Progress Note Patient Name: BINTA STATZER DOB: 11/19/1975 MRN: [id number]   Date of Service  04/10/2022  HPI/Events of Note  Patient is currently without nausea, vomiting or abdominal pain.  eICU Interventions  Diet advanced.        Migdalia Dk 04/10/2022, 9:33 PM

## 2022-04-10 NOTE — Consult Note (Addendum)
[place]  [phone] [phone]    [place]  Referring MD:  Dr. Ina Homes  Reason for Referral: Anemia, thrombocytopenia  HPI: [name] is a 46 year old female with a past medical history significant for acid reflux, hypertension, ulcer, anemia.  She presented to the emergency department secondary to fatigue and dizziness.  She was hypotensive at initial presentation.  Initial lab work showed a WBC of 11.4, hemoglobin 7, platelets 118,000, MCV 130.7, no differential checked, potassium 1.9, BUN 72, creatinine 6.38, magnesium 2.5.  Additional labs showed an albumin of 3.0, AST 130, T. bili 10.2, direct bili 5.5, indirect bili 4.7, LDH 217, lipase 267.  GI panel positive for norovirus.  Additional lab work has been ordered including reticulocytes, blood cultures, ANA, PT/INR, C3 complement, C4 complement, anti-DNA antibody, and ADAMTS 13.  The patient has been seen by nephrology who expressed concern for TTP.  She had a CT of the abdomen/pelvis performed which showed severe fatty liver, artifact versus less likely mild acute pancreatitis, no bowel obstruction.  The patient has been followed at the cancer center for her anemia.  Last visit was on 01/18/2022.  She has a diagnosis of iron deficiency anemia.  Has received IV iron which was last given on 07/21/2021.  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  She also carries a diagnosis of vitamin B12 deficiency.  She has received vitamin B12 injections in the past.  She also has folate deficiency and was started on folic acid 1 mg daily.  The patient had a central venous catheter placed earlier today.  She received Versed prior to the procedure and is somnolent today.  However, she will wake up and answer questions briefly.  She reports that she has been having intermittent headaches.  Has  also been having nausea and vomiting.  Denies diarrhea.  She denies chest pain or shortness of breath.  She has not noticed any bleeding.  Family history significant for a mother with renal cell carcinoma, maternal aunt with breast cancer, paternal uncle with lung cancer, and another maternal aunt who had pancreatic cancer.  Denies history of hematologic disorders/malignancy. Hematology was asked to see the patient to make recommendations regarding her anemia and thrombocytopenia.  Past Medical History:  Diagnosis Date   Acid reflux    Anemia    Ulcer   :  Past Surgical History:  Procedure Laterality Date   ABDOMINAL SURGERY     abdominoplasty   BREAST BIOPSY Right 2013   CHOLECYSTECTOMY N/A 11/23/2019   Procedure: LAPAROSCOPIC CHOLECYSTECTOMY WITH INTRAOPERATIVE CHOLANGIOGRAM;  Surgeon: Kinsinger, Arta Bruce, MD;  Location: [facility];  Service: General;  Laterality: N/A;   COSMETIC SURGERY     tummy tuck   HERNIA REPAIR     ROUX-EN-Y PROCEDURE     TUMOR REMOVAL     WRIST GANGLION EXCISION    :  CURRENT MEDS: Current Facility-Administered Medications  Medication Dose Route Frequency Provider Last Rate Last Admin   0.9 %  sodium chloride infusion (Manually program via Guardrails IV Fluids)   Intravenous Once Magdalen Spatz, NP       0.9 %  sodium chloride infusion (Manually program via Guardrails IV Fluids)   Intravenous Once Magdalen Spatz, NP       0.9 %  sodium chloride infusion   Intravenous Continuous Magdalen Spatz, NP  docusate sodium (COLACE) capsule 100 mg  100 mg Oral BID PRN Magdalen Spatz, NP       famotidine (PEPCID) tablet 20 mg  20 mg Oral BID Magdalen Spatz, NP       midazolam (VERSED) injection 1-2 mg  1-2 mg Intravenous Once Candee Furbish, MD       ondansetron [facility]) injection 4 mg  4 mg Intravenous Q6H PRN Magdalen Spatz, NP       polyethylene glycol (MIRALAX / GLYCOLAX) packet 17 g  17 g Oral Daily PRN Magdalen Spatz, NP        Allergies  Allergen  Reactions   Other Rash    Burns skin -- Cannot use Hypofix or Transpore. Only Paper Tape  :  Family History  Problem Relation Age of Onset   Cancer Mother        kidney   Cancer Paternal Aunt    Breast cancer Maternal Aunt    Pancreatic cancer Maternal Aunt    Lung cancer Paternal Uncle   :  Social History   Socioeconomic History   Marital status: Single    Spouse name: Not on file   Number of children: 1   Years of education: Not on file   Highest education level: Not on file  Occupational History    Employer: [employer]    Comment: [employer]  Tobacco Use   Smoking status: Every Day    Packs/day: 0.25    Years: 25.00    Total pack years: 6.25    Types: Cigarettes   Smokeless tobacco: Never  Vaping Use   Vaping Use: Never used  Substance and Sexual Activity   Alcohol use: Not Currently    Alcohol/week: 12.0 standard drinks of alcohol    Types: 12 Glasses of wine per week   Drug use: No   Sexual activity: Not on file  Other Topics Concern   Not on file  Social History Narrative   Not on file   Social Determinants of Health   Financial Resource Strain: Not on file  Food Insecurity: Not on file  Transportation Needs: Not on file  Physical Activity: Not on file  Stress: Not on file  Social Connections: Not on file  Intimate Partner Violence: Not on file  :  REVIEW OF SYSTEMS:  A comprehensive 14 point review of systems was negative except as noted in the HPI.    Exam: Patient Vitals for the past 24 hrs:  BP Temp Temp src Pulse Resp SpO2 Height Weight  04/10/22 1345 (!) 90/41 98 F (36.7 C) Oral 72 17 97 % -- --  04/10/22 1337 (!) 99/44 -- -- 78 20 97 % -- --  04/10/22 1249 -- 97.7 F (36.5 C) -- -- -- -- -- --  04/10/22 1245 (!) 97/48 -- -- 71 13 100 % -- --  04/10/22 1230 (!) 93/50 -- -- 72 14 95 % -- --  04/10/22 1200 (!) 85/47 -- -- 72 15 100 % -- --  04/10/22 1130 (!) 102/50 -- -- 69 (!) 23 93 % -- --  04/10/22 1100 (!) 92/49 97.9 F (36.6 C) -- 73 16 98 %  -- --  04/10/22 1030 (!) 97/49 -- -- 73 (!) 21 94 % -- --  04/10/22 1000 (!) 91/47 -- -- 73 20 94 % -- --  04/10/22 0730 (!) 91/42 -- -- 64 17 95 % -- --  04/10/22 0715 (!) 87/49 -- -- 65 19 93 % -- --  04/10/22 0700 (!) 91/49 -- -- 67 18 93 % -- --  04/10/22 0645 (!) 89/54 -- -- 69 18 98 % -- --  04/10/22 0630 (!) 82/48 -- -- 63 17 94 % -- --  04/10/22 0600 (!) 89/46 -- -- 66 19 94 % -- --  04/10/22 0545 (!) 88/47 -- -- 66 20 93 % -- --  04/10/22 0515 (!) 80/46 -- -- 69 19 92 % -- --  04/10/22 0500 (!) 90/45 -- -- 71 (!) 21 93 % -- --  04/10/22 0455 (!) 88/48 -- -- -- -- -- -- --  04/10/22 0450 (!) 83/47 -- -- 75 19 97 % -- --  04/10/22 0304 (!) 97/53 -- -- 69 15 99 % -- --  04/10/22 0301 -- -- -- 70 (!) 21 (!) 85 % -- --  04/10/22 0300 (!) 97/53 -- -- 72 20 -- -- --  04/10/22 0230 (!) 91/54 -- -- 73 (!) 21 97 % -- --  04/10/22 0215 -- -- -- 77 (!) 21 92 % -- --  04/10/22 0115 (!) 95/52 -- -- 79 (!) 21 93 % -- --  04/10/22 0045 (!) 98/43 -- -- 86 (!) 21 99 % -- --  04/10/22 0015 (!) 76/56 -- -- 77 13 99 % -- --  04/10/22 0000 (!) 108/41 -- -- 86 20 99 % -- --  04/09/22 2345 (!) 98/48 -- -- 74 19 98 % -- --  04/09/22 2300 (!) 109/44 -- -- 79 18 100 % -- --  04/09/22 2233 -- -- -- -- -- -- $Rem'5\' 6"'gfKX$  (1.676 m) 99.2 kg  04/09/22 2230 (!) 90/43 98 F (36.7 C) -- 80 18 100 % -- --    Physical Exam Vitals reviewed.  Constitutional:      General: She is not in acute distress. HENT:     Head: Normocephalic.     Mouth/Throat:     Pharynx: No oropharyngeal exudate or posterior oropharyngeal erythema.  Eyes:     General: Scleral icterus present.  Cardiovascular:     Rate and Rhythm: Normal rate.     Heart sounds: Murmur heard.  Pulmonary:     Effort: Pulmonary effort is normal. No respiratory distress.     Breath sounds: Normal breath sounds.  Abdominal:     General: Bowel sounds are normal.     Palpations: Abdomen is soft.     Tenderness: There is no abdominal tenderness.  Skin:     General: Skin is warm and dry.  Neurological:     Mental Status: She is alert and oriented to person, place, and time.    LABS:  Lab Results  Component Value Date   WBC 9.6 04/10/2022   HGB 6.0 (LL) 04/10/2022   HCT 16.5 (L) 04/10/2022   PLT 82 (L) 04/10/2022   GLUCOSE 109 (H) 04/10/2022   ALT 39 04/10/2022   AST 130 (H) 04/10/2022   NA 133 (L) 04/10/2022   K 2.1 (LL) 04/10/2022   CL 99 04/10/2022   CREATININE 6.54 (H) 04/10/2022   BUN 72 (H) 04/10/2022   CO2 19 (L) 04/10/2022    CT ABDOMEN PELVIS WO CONTRAST  Result Date: 04/10/2022 CLINICAL DATA:  Nausea vomiting. EXAM: CT ABDOMEN AND PELVIS WITHOUT CONTRAST TECHNIQUE: Multidetector CT imaging of the abdomen and pelvis was performed following the standard protocol without IV contrast. RADIATION DOSE REDUCTION: This exam was performed according to the departmental dose-optimization program which includes automated exposure control, adjustment of the mA and/or  kV according to patient size and/or use of iterative reconstruction technique. COMPARISON:  CT abdomen pelvis dated 10/19/2010. FINDINGS: Evaluation of this exam is limited in the absence of intravenous contrast. Lower chest: The visualized lung bases are clear. No intra-abdominal free air or free fluid. Hepatobiliary: Severe fatty liver. No intrahepatic dilatation. A 2 cm rounded structure along the medial left lobe of the liver (27/4) demonstrates similar attenuation as liver parenchyma, likely represent hepatic tissue. Cholecystectomy. Pancreas: Mild haziness adjacent to the uncinate process of the pancreas, possibly related to volume averaging. Correlation with pancreatic enzymes recommended to exclude pancreatitis. No dilatation of the main pancreatic duct or gland atrophy. Spleen: Normal in size without focal abnormality. Adrenals/Urinary Tract: The adrenal glands are unremarkable. The kidneys, visualized ureters, and the urinary bladder appear unremarkable. Stomach/Bowel:  Postsurgical changes of gastric bypass. There is no bowel obstruction or active inflammation. The appendix is normal. Vascular/Lymphatic: Mild aortoiliac atherosclerotic disease. The IVC is unremarkable. No portal venous gas. There is no adenopathy. Reproductive: The uterus is anteverted. No adnexal masses. Surgical clips in the region of the left adnexa. Other: None Musculoskeletal: No acute or significant osseous findings. IMPRESSION: 1. Severe fatty liver. 2. Artifact versus less likely mild acute pancreatitis. Correlation with pancreatic enzymes recommended. 3. No bowel obstruction. Normal appendix. 4. Aortic Atherosclerosis (ICD10-I70.0). Electronically Signed   By: Anner Crete M.D.   On: 04/10/2022 02:42     ASSESSMENT AND PLAN:  1.  Macrocytic anemia 2.  Thrombocytopenia 3.  AKI 4.  Hypotension 5.  Transaminitis 6.  Fatty liver disease 7.  History of bariatric surgery 8.  Tobacco dependence 9.  Alcohol abuse  -The patient is anemic but significantly worse compared to prior labs in April 2023.  She has a known history of iron deficiency, vitamin B12 deficiency, and folate deficiency. Thrombocytopenia is new this admission. -Labs drawn so far have been reviewed.  LDH mildly elevated and indirect bilirubin elevated suggestive of hemolysis.  Awaiting results of haptoglobin and reticulocytes. -Peripheral blood smear was reviewed at the [place] location earlier today.  Technologist indicated that platelets appear decreased and that there were giant platelets.  There is no mention of schistocytes. -I have contacted the [facility] lab and asked for a peripheral blood smear to be made for review this evening.  Labs are being drawn at the time my visit as all of her prior blood work was performed at [facility]. -I agree with PRBC transfusion per ICU parameters. -She has new AKI in addition to the above hematologic findings.  Findings are concerning for TTP versus complement mediated TMA.   Dialysis catheter in place. Await further recommendations pending review of peripheral blood smear.   Thank you for this referral.  Mikey Bussing, DNP, AGPCNP-BC, AOCNP   ADDENDUM: I saw and examined Ms. Flemings.  She is very nice.  This is incredibly interesting situation.  I looked at her blood smear.  I did not see any schistocytes.  There is a lot of target cells.  I do not see any nucleated red blood cells.  She had no immature myeloid or lymphoid forms.  There were no hypersegmented polys.  Her platelets were decreased in number and size.  I have a hard time believing this is going to be a microangiopathic hemolytic anemia (i.e. TTP/HUS).  She does have norovirus but she says she really does not have diarrhea.  Her reticulocyte count is slightly elevated.  When corrected for her low hemoglobin, it is not all that  high.  Her LDH is not all that high for a microangiopathic hemolytic process.  I am sending off a ADAMTS-13 assay.  We will have to see what it shows.  I do suspect that she may have marrow toxicity from alcohol use.  She may have alcohol poisoning of the bone marrow.  I think the only way to find this out would be to do a bone marrow biopsy on her.  I think we can try to avoid doing that for right now.  I do not see that she has a autoimmune hemolytic anemia or thrombocytopenia (i.e. Evans syndrome).  I wonder if she has some element of hepatorenal syndrome given that her bilirubin is still high.  Again this is not mostly indirect.  She has had a fatty liver that was noted on CT scan.  Again I suspect this is from excessive alcohol use.  She has marked hypokalemia.  I would think this may go along with diarrhea.  I wonder if just IV fluids may not help with her renal function.  Maybe she is just profoundly hypotensive from dehydration.  Again, I do not think we have to institute plasma exchange right now.  I think we just have to see how her levels trend.  Again she does  have significant hepatic dysfunction.  Again I suspect she has significant alcohol use.  Thankfully, she is stopped.  We will follow along.  Again this is incredibly interesting.  I must say that she is very very nice.  I truly enjoyed talking with her.  We actually had a good prayer.  Her faith remains strong.  She is seen Dr. Lorenso Courier in the [facility].  We will have him notified about her admission and he can certainly follow her along.  I know that she will get incredible care from the outstanding staff in the CCU.  The compassion of the CCU staff is incredible.  Lattie Haw, MD  [facility] [phone]

## 2022-04-10 NOTE — ED Notes (Signed)
CRITICAL VALUE STICKER  CRITICAL VALUE: Hgb 6.0  RECEIVER (on-site recipient of call):Kolette Vey, RN  DATE & TIME NOTIFIED: [phone]  MESSENGER (representative from lab):Maurine Minister  MD NOTIFIED: Long, MD  TIME OF NOTIFICATION:0946  RESPONSE:  See orders

## 2022-04-10 NOTE — H&P (Addendum)
NAME:  [name], MRN:  [id number], DOB:  07/28/1976, LOS: 0 ADMISSION DATE:  04/09/2022, CONSULTATION DATE:  04/10/2022 REFERRING MD:  Dina Rich, CHIEF COMPLAINT:  Acute Renal Failure , Anemia, Thrombocytopenia  History of Present Illness:  Health History obtained from Medical Records 46 year old female current every day smoker with past medical history of Acid reflux, Anemia, Ulcer, HTN who presents to the ED 04/10/2022 with fatigue and dizziness.  She is nontoxic-appearing.  Initial vital signs are notable for blood pressure of 90/43.  Patient reports a history of hypertension but has not been taking her medications regularly due to insurance issues. EKG shows no evidence of arrhythmia or ischemia.  Patient was given a liter of fluids.  Work-up notable for an significant for potassium of 1.9, creatinine of 6.38.  This was previously normal.  Labs have been repleted, but most likely will need more. Hemoglobin has dropped from 7.0 to 6.0, from normal in April, platelets 118 now down to 82.  Creatinine 6.54/ BUN 72, and unclear etiology for the patient's renal dysfunction.  She denies any new meds or significant NSAID use.  She denies significant dehydration. Other labs may be suggestive of HUS ( Hemolytic Uremic Syndrome) / TTP especially given history of intermittent bleeding. No active bleeding on exam.   Do not have access to Will need renal ultrasound, as not available at [facility]. Patient reports history of renal cell carcinoma in her family. This is concerning for her.  CT scan of the abdomen shows severe fatty liver, Artifact versus less likely mild acute pancreatitis . No Bowel obstruction. She has history of 4 shots of alcohol per day. The kidneys, visualized ureters, and the urinary bladder appear unremarkable.   She is unable to provide a urine sample.  She does state that she is urinating at home.  Second liter of fluids ordered in the ED.  Nephrology has been consulted.Hematology has been  consulted. ( [place])  Labs reviewed: Na 133/ K 2.1/ Cl 99/ CO2 19/Glucose 109/BUN 72/ Creatinine 6.54/ Calcium 8.7/ Gap 15 /  LDH 217/ Albumin 3.0/ Alk Phos 105/ Lipase 267/ AST 130/ ALT 39/ Direct Bili 5.5/ Total Bili 10.2 CK 23  WBC 9.6/ HGB 6.0/Platelets 82 from 118 earlier today Vitamin B 12 1136 Iron 181, saturation ratios 48. Serum folate 3.2, Ferritin 73,  Methylmalonic Acid 193 GI Panel + for Norovirus but asymptomatic  PCCM have been asked to admit patient to ICU and manage care.  Pertinent  Medical History   Past Medical History:  Diagnosis Date   Acid reflux    Anemia    Ulcer   Hypertension  Tobacco Abuse ETOH Abuse Bariatric Surgery Tobacco Abuse  Significant Hospital Events: Including procedures, antibiotic start and stop dates in addition to other pertinent events   04/10/2022 Admission to [place] with transfer to [facility] ICU  7/17>> Trialysis insertion     04/10/2022 CT Chest Abdomen   Hepatobiliary: Severe fatty liver. No intrahepatic dilatation. A 2 cm rounded structure along the medial left lobe of the liver (27/4) demonstrates similar attenuation as liver parenchyma, likely represent hepatic tissue. Cholecystectomy.   Pancreas: Mild haziness adjacent to the uncinate process of the pancreas, possibly related to volume averaging. Correlation with pancreatic enzymes recommended to exclude pancreatitis. No dilatation of the main pancreatic duct or gland atrophy.   Spleen: Normal in size without focal abnormality.   Adrenals/Urinary Tract: The adrenal glands are unremarkable. The kidneys, visualized ureters, and the urinary bladder appear  unremarkable.   Stomach/Bowel: Postsurgical changes of gastric bypass. There is no bowel obstruction or active inflammation. The appendix is normal.   Vascular/Lymphatic: Mild aortoiliac atherosclerotic disease. The IVC is unremarkable. No portal venous gas. There is no adenopathy.   Reproductive: The uterus  is anteverted. No adnexal masses. Surgical clips in the region of the left adnexa.   Other: None   Musculoskeletal: No acute or significant osseous findings.   IMPRESSION: 1. Severe fatty liver. 2. Artifact versus less likely mild acute pancreatitis. Correlation with pancreatic enzymes recommended. 3. No bowel obstruction. Normal appendix. 4. Aortic Atherosclerosis (ICD10-I70.0).   Interim History / Subjective:  Awake and alert Rest as above  Objective   Blood pressure (!) 97/48, pulse 71, temperature 97.7 F (36.5 C), resp. rate 13, height '5\' 6"'  (1.676 m), weight 99.2 kg, last menstrual period 03/10/2022, SpO2 100 %.        Intake/Output Summary (Last 24 hours) at 04/10/2022 1300 Last data filed at 04/10/2022 0519 Gross per 24 hour  Intake 2244.2 ml  Output --  Net 2244.2 ml   Filed Weights   04/09/22 2233  Weight: 99.2 kg    Examination: General:  46 year old female in NAD, on RA HENT:  NCAT, No LAD No JVD, MM dry Lungs:  Bilateral chest excursion, clear to auscultation Cardiovascular:  S1, S2, RRR, + Murmur Abdomen: Soft, NT, ND, BS +, Body mass index is 35.3 kg/m.\ Extremities: No obvious deformities, Extremities cool to touch , Cap Refill 3 seconds Neuro:  Awake and alert and oriented , MAE x 4, Appropriate  GU: Not assessed  Resolved Hospital Problem list     Assessment & Plan:  Thrombocytopenia  Anemia Bleeding vs Hemolysis  ? TTP No family Hx auto immune Plan Transfuse 2 units PRBC now CBC 1 hour post transfusion  Serial CBC Q 6 hours Hematology consulted Nephrology Consulted Peripheral smear to look for Schistocytes Trialysis Cath insertion for potential pressor use / plasma exchange Consider Plasma Phoresis / Plasma Exchange Auto immune panel >> No family Hx of autoimmune disease per patient  Trend PT/ INR DIC panel now  Hypotension  Murmur Plan Echo Tranfuse blood to support BP Check Lactate Add pressors as needed to maintain MAP >  65 mm Hg EKG in am   Acute Renal Failure  Hypokalemia Hyponatremia Hyp[place]ag>> repleted and resolved  Minimal UO Plan  Nephrology referral, Tx per Nephro  Bladder Scan Trend BUN and creatinine  Strict I&O Serum K Q 6 Trend Mag and maintain > 2 Trend BMET and replete as needed  Elevated LFT Transaminitis Fatty Liver Disease Elevated Lipase Elevated LDH Plan Trend LFT's  RUQ [place] Trend INR and Coags  GI Panel + for Norovirus ? Cause multi system organ Failure Plan Enteric Precautions  If symptomatic , supportative care   Tobacco Abuse Plan OP Smoking cessation counseling  Add  Nicoderm patch as needed for withdrawal  Best Practice (right click and "Reselect all SmartList Selections" daily)   Diet/type: NPO DVT prophylaxis: SCD GI prophylaxis: H2B Lines: Dialysis Catheter Foley:  Yes, and it is still needed Code Status:  full code Last date of multidisciplinary goals of care discussion [ NO family at bedside, patient who is alert and oriented has been updated in full by Dr. Tamala Julian and Chand]  Labs   CBC: Recent Labs  Lab 04/09/22 2242 04/10/22 0905  WBC 11.4* 9.6  NEUTROABS  --  6.6  HGB 7.0* 6.0*  HCT 19.6* 16.5*  MCV  130.7* 129.9*  PLT 118* 82*    Basic Metabolic Panel: Recent Labs  Lab 04/09/22 2242 04/10/22 0905  NA 134* 133*  K 1.9* 2.1*  CL 95* 99  CO2 22 19*  GLUCOSE 124* 109*  BUN 72* 72*  CREATININE 6.38* 6.54*  CALCIUM 9.6 8.7*  MG 2.5*  --    GFR: Estimated Creatinine Clearance: 13 mL/min (A) (by C-G formula based on SCr of 6.54 mg/dL (H)). Recent Labs  Lab 04/09/22 2242 04/10/22 0905  WBC 11.4* 9.6    Liver Function Tests: Recent Labs  Lab 04/10/22 0234  AST 130*  ALT 39  ALKPHOS 105  BILITOT 10.2*  PROT 7.4  ALBUMIN 3.0*   Recent Labs  Lab 04/10/22 0234  LIPASE 267*   No results for input(s): "AMMONIA" in the last 168 hours.  ABG    Component Value Date/Time   TCO2 28 10/06/2009 2320     Coagulation  Profile: No results for input(s): "INR", "PROTIME" in the last 168 hours.  Cardiac Enzymes: Recent Labs  Lab 04/09/22 2320  CKTOTAL 23*    HbA1C: No results found for: "HGBA1C"  CBG: Recent Labs  Lab 04/09/22 2329  GLUCAP 119*    Review of Systems:   Fatigue, Lightheaded and dizzy. Anxious   Past Medical History:  She,  has a past medical history of Acid reflux, Anemia, and Ulcer.   Surgical History:   Past Surgical History:  Procedure Laterality Date   ABDOMINAL SURGERY     abdominoplasty   BREAST BIOPSY Right 2013   CHOLECYSTECTOMY N/A 11/23/2019   Procedure: LAPAROSCOPIC CHOLECYSTECTOMY WITH INTRAOPERATIVE CHOLANGIOGRAM;  Surgeon: Kieth Brightly Arta Bruce, MD;  Location: [facility];  Service: General;  Laterality: N/A;   COSMETIC SURGERY     tummy tuck   HERNIA REPAIR     ROUX-EN-Y PROCEDURE     TUMOR REMOVAL     WRIST GANGLION EXCISION       Social History:   reports that she has been smoking cigarettes. She has a 6.25 pack-year smoking history. She has never used smokeless tobacco. She reports that she does not currently use alcohol after a past usage of about 12.0 standard drinks of alcohol per week. She reports that she does not use drugs.   Family History:  Her family history includes Breast cancer in her maternal aunt; Cancer in her mother and paternal aunt; Lung cancer in her paternal uncle; Pancreatic cancer in her maternal aunt.   Allergies Allergies  Allergen Reactions   Other Rash    Burns skin -- Cannot use Hypofix or Transpore. Only Paper Tape     Home Medications  Prior to Admission medications   Medication Sig Start Date End Date Taking? Authorizing Provider  busPIRone (BUSPAR) 15 MG tablet Take 15 mg by mouth 3 (three) times daily. 06/09/21   [provider]  folic acid (FOLVITE) 1 MG tablet Take 1 tablet (1 mg total) by mouth daily. 01/20/22   Dede Query T, PA-C  gabapentin (NEURONTIN) 300 MG capsule Take 1 capsule (300 mg total) by  mouth 3 (three) times daily. 10/04/20   Raylene Everts, MD  sertraline (ZOLOFT) 100 MG tablet Take 100 mg by mouth as needed. 06/15/21   [provider]  valsartan (DIOVAN) 80 MG tablet Take 80 mg by mouth daily. 06/27/21   [provider]  pantoprazole (PROTONIX) 20 MG tablet Take 1 tablet (20 mg total) by mouth daily for 14 days. 08/05/18 10/04/20  Couture, Cortni S,  PA-C     Critical care time: 45 minutes    Magdalen Spatz, MSN, AGACNP-BC [place] for personal pager PCCM on call pager [phone]  04/10/2022 3:05 PM

## 2022-04-10 NOTE — ED Notes (Signed)
CRITICAL VALUE STICKER  CRITICAL VALUE:K+ 2.1  RECEIVER (on-site recipient of call):Kenedie Dirocco, RN  DATE & TIME NOTIFIED: 1018  MESSENGER (representative from lab):Inetta Fermo  MD NOTIFIED: Long  TIME OF NOTIFICATION:1018  RESPONSE:  See orders

## 2022-04-10 NOTE — ED Notes (Signed)
MD aware of hypotension. No further orders at this time.

## 2022-04-10 NOTE — Progress Notes (Signed)
  Echocardiogram 2D Echocardiogram has been performed.  Augustine Radar 04/10/2022, 3:48 PM

## 2022-04-11 DIAGNOSIS — E876 Hypokalemia: Secondary | ICD-10-CM

## 2022-04-11 DIAGNOSIS — D649 Anemia, unspecified: Secondary | ICD-10-CM

## 2022-04-11 LAB — CBC WITH DIFFERENTIAL/PLATELET
Abs Immature Granulocytes: 0.05 10*3/uL (ref 0.00–0.07)
Abs Immature Granulocytes: 0.12 10*3/uL — ABNORMAL HIGH (ref 0.00–0.07)
Abs Immature Granulocytes: 0.1 10*3/uL — ABNORMAL HIGH (ref 0.00–0.07)
Abs Immature Granulocytes: 0.09 10*3/uL — ABNORMAL HIGH (ref 0.00–0.07)
Basophils Absolute: 0 10*3/uL (ref 0.0–0.1)
Basophils Absolute: 0.1 10*3/uL (ref 0.0–0.1)
Basophils Absolute: 0.1 10*3/uL (ref 0.0–0.1)
Basophils Absolute: 0.1 10*3/uL (ref 0.0–0.1)
Basophils Relative: 0 %
Basophils Relative: 1 %
Basophils Relative: 1 %
Basophils Relative: 1 %
Eosinophils Absolute: 0.1 10*3/uL (ref 0.0–0.5)
Eosinophils Absolute: 0.2 10*3/uL (ref 0.0–0.5)
Eosinophils Absolute: 0.2 10*3/uL (ref 0.0–0.5)
Eosinophils Absolute: 0.2 10*3/uL (ref 0.0–0.5)
Eosinophils Relative: 2 %
Eosinophils Relative: 2 %
Eosinophils Relative: 2 %
Eosinophils Relative: 1 %
HCT: 14.3 % — ABNORMAL LOW (ref 36.0–46.0)
HCT: 20.7 % — ABNORMAL LOW (ref 36.0–46.0)
HCT: 21.7 % — ABNORMAL LOW (ref 36.0–46.0)
HCT: 22.4 % — ABNORMAL LOW (ref 36.0–46.0)
Hemoglobin: 5.1 g/dL — CL (ref 12.0–15.0)
Hemoglobin: 7.5 g/dL — ABNORMAL LOW (ref 12.0–15.0)
Hemoglobin: 7.7 g/dL — ABNORMAL LOW (ref 12.0–15.0)
Hemoglobin: 7.9 g/dL — ABNORMAL LOW (ref 12.0–15.0)
Immature Granulocytes: 1 %
Immature Granulocytes: 1 %
Immature Granulocytes: 1 %
Immature Granulocytes: 1 %
Lymphocytes Relative: 18 %
Lymphocytes Relative: 13 %
Lymphocytes Relative: 12 %
Lymphocytes Relative: 10 %
Lymphs Abs: 1.4 10*3/uL (ref 0.7–4.0)
Lymphs Abs: 1.4 10*3/uL (ref 0.7–4.0)
Lymphs Abs: 1.5 10*3/uL (ref 0.7–4.0)
Lymphs Abs: 1.3 10*3/uL (ref 0.7–4.0)
MCH: 45.9 pg — ABNORMAL HIGH (ref 26.0–34.0)
MCH: 39.5 pg — ABNORMAL HIGH (ref 26.0–34.0)
MCH: 39.3 pg — ABNORMAL HIGH (ref 26.0–34.0)
MCH: 39.1 pg — ABNORMAL HIGH (ref 26.0–34.0)
MCHC: 35.7 g/dL (ref 30.0–36.0)
MCHC: 36.2 g/dL — ABNORMAL HIGH (ref 30.0–36.0)
MCHC: 35.5 g/dL (ref 30.0–36.0)
MCHC: 35.3 g/dL (ref 30.0–36.0)
MCV: 128.8 fL — ABNORMAL HIGH (ref 80.0–100.0)
MCV: 108.9 fL — ABNORMAL HIGH (ref 80.0–100.0)
MCV: 110.7 fL — ABNORMAL HIGH (ref 80.0–100.0)
MCV: 110.9 fL — ABNORMAL HIGH (ref 80.0–100.0)
Monocytes Absolute: 0.7 10*3/uL (ref 0.1–1.0)
Monocytes Absolute: 1 10*3/uL (ref 0.1–1.0)
Monocytes Absolute: 1 10*3/uL (ref 0.1–1.0)
Monocytes Absolute: 1.1 10*3/uL — ABNORMAL HIGH (ref 0.1–1.0)
Monocytes Relative: 9 %
Monocytes Relative: 9 %
Monocytes Relative: 8 %
Monocytes Relative: 8 %
Neutro Abs: 5.5 10*3/uL (ref 1.7–7.7)
Neutro Abs: 8.4 10*3/uL — ABNORMAL HIGH (ref 1.7–7.7)
Neutro Abs: 9.4 10*3/uL — ABNORMAL HIGH (ref 1.7–7.7)
Neutro Abs: 10.1 10*3/uL — ABNORMAL HIGH (ref 1.7–7.7)
Neutrophils Relative %: 70 %
Neutrophils Relative %: 74 %
Neutrophils Relative %: 76 %
Neutrophils Relative %: 79 %
Platelets: 77 10*3/uL — ABNORMAL LOW (ref 150–400)
Platelets: 91 10*3/uL — ABNORMAL LOW (ref 150–400)
Platelets: 93 10*3/uL — ABNORMAL LOW (ref 150–400)
Platelets: 91 10*3/uL — ABNORMAL LOW (ref 150–400)
RBC: 1.11 MIL/uL — ABNORMAL LOW (ref 3.87–5.11)
RBC: 1.9 MIL/uL — ABNORMAL LOW (ref 3.87–5.11)
RBC: 1.96 MIL/uL — ABNORMAL LOW (ref 3.87–5.11)
RBC: 2.02 MIL/uL — ABNORMAL LOW (ref 3.87–5.11)
RDW: 15.5 % (ref 11.5–15.5)
Smear Review: NORMAL
Smear Review: NORMAL
WBC: 7.7 10*3/uL (ref 4.0–10.5)
WBC: 11.2 10*3/uL — ABNORMAL HIGH (ref 4.0–10.5)
WBC: 12.3 10*3/uL — ABNORMAL HIGH (ref 4.0–10.5)
WBC: 12.8 10*3/uL — ABNORMAL HIGH (ref 4.0–10.5)
nRBC: 0.6 % — ABNORMAL HIGH (ref 0.0–0.2)
nRBC: 0.4 % — ABNORMAL HIGH (ref 0.0–0.2)
nRBC: 0.3 % — ABNORMAL HIGH (ref 0.0–0.2)
nRBC: 0.4 % — ABNORMAL HIGH (ref 0.0–0.2)

## 2022-04-11 LAB — URINALYSIS, ROUTINE W REFLEX MICROSCOPIC
Bilirubin Urine: NEGATIVE
Glucose, UA: NEGATIVE mg/dL
Hgb urine dipstick: NEGATIVE
Ketones, ur: NEGATIVE mg/dL
Leukocytes,Ua: NEGATIVE
Nitrite: NEGATIVE
Protein, ur: NEGATIVE mg/dL
Specific Gravity, Urine: 1.011 (ref 1.005–1.030)
pH: 6 (ref 5.0–8.0)

## 2022-04-11 LAB — ANTI-DNA ANTIBODY, DOUBLE-STRANDED: ds DNA Ab: 1 IU/mL (ref 0–9)

## 2022-04-11 LAB — PHOSPHORUS: Phosphorus: 2.4 mg/dL — ABNORMAL LOW (ref 2.5–4.6)

## 2022-04-11 LAB — BASIC METABOLIC PANEL
Anion gap: 12 (ref 5–15)
BUN: 66 mg/dL — ABNORMAL HIGH (ref 6–20)
CO2: 17 mmol/L — ABNORMAL LOW (ref 22–32)
Calcium: 7.5 mg/dL — ABNORMAL LOW (ref 8.9–10.3)
Chloride: 105 mmol/L (ref 98–111)
Creatinine, Ser: 5.72 mg/dL — ABNORMAL HIGH (ref 0.44–1.00)
GFR, Estimated: 9 mL/min — ABNORMAL LOW (ref >60)
Glucose, Bld: 135 mg/dL — ABNORMAL HIGH (ref 70–99)
Potassium: 2.4 mmol/L — CL (ref 3.5–5.1)
Sodium: 134 mmol/L — ABNORMAL LOW (ref 135–145)

## 2022-04-11 LAB — NA AND K (SODIUM & POTASSIUM), RAND UR
Potassium Urine: 7 mmol/L
Sodium, Ur: <10 mmol/L

## 2022-04-11 LAB — IRON AND TIBC
Iron: 65 ug/dL (ref 28–170)
Saturation Ratios: 55 % — ABNORMAL HIGH (ref 10.4–31.8)
TIBC: 118 ug/dL — ABNORMAL LOW (ref 250–450)
UIBC: 53 ug/dL

## 2022-04-11 LAB — POTASSIUM
Potassium: 2.5 mmol/L — CL (ref 3.5–5.1)
Potassium: 3.2 mmol/L — ABNORMAL LOW (ref 3.5–5.1)

## 2022-04-11 LAB — LACTATE DEHYDROGENASE: LDH: 147 U/L (ref 98–192)

## 2022-04-11 LAB — HEPATIC FUNCTION PANEL
ALT: 46 U/L — ABNORMAL HIGH (ref 0–44)
AST: 136 U/L — ABNORMAL HIGH (ref 15–41)
Albumin: 2.1 g/dL — ABNORMAL LOW (ref 3.5–5.0)
Alkaline Phosphatase: 102 U/L (ref 38–126)
Bilirubin, Direct: 5.8 mg/dL — ABNORMAL HIGH (ref 0.0–0.2)
Indirect Bilirubin: 6.2 mg/dL — ABNORMAL HIGH (ref 0.3–0.9)
Total Bilirubin: 12 mg/dL — ABNORMAL HIGH (ref 0.3–1.2)
Total Protein: 6.6 g/dL (ref 6.5–8.1)

## 2022-04-11 LAB — RETICULOCYTES
Immature Retic Fract: 18.9 % — ABNORMAL HIGH (ref 2.3–15.9)
RBC.: 1.85 MIL/uL — ABNORMAL LOW (ref 3.87–5.11)
Retic Count, Absolute: 72 10*3/uL (ref 19.0–186.0)
Retic Ct Pct: 3.9 % — ABNORMAL HIGH (ref 0.4–3.1)

## 2022-04-11 LAB — MAGNESIUM: Magnesium: 2.7 mg/dL — ABNORMAL HIGH (ref 1.7–2.4)

## 2022-04-11 LAB — PROTIME-INR
INR: 1.7 — ABNORMAL HIGH (ref 0.8–1.2)
Prothrombin Time: 19.6 seconds — ABNORMAL HIGH (ref 11.4–15.2)

## 2022-04-11 LAB — HAPTOGLOBIN: Haptoglobin: 71 mg/dL (ref 42–296)

## 2022-04-11 LAB — LIPASE, BLOOD: Lipase: 103 U/L — ABNORMAL HIGH (ref 11–51)

## 2022-04-11 LAB — ADAMTS13 ACTIVITY REFLEX

## 2022-04-11 LAB — ADAMTS13 ACTIVITY: Adamts 13 Activity: 48.1 % — ABNORMAL LOW (ref >66.8)

## 2022-04-11 LAB — STREP PNEUMONIAE URINARY ANTIGEN: Strep Pneumo Urinary Antigen: NEGATIVE

## 2022-04-11 LAB — C3 COMPLEMENT: C3 Complement: 100 mg/dL (ref 82–167)

## 2022-04-11 LAB — ANA W/REFLEX IF POSITIVE: Anti Nuclear Antibody (ANA): NEGATIVE

## 2022-04-11 LAB — DIRECT ANTIGLOBULIN TEST (NOT AT ARMC)
DAT, IgG: NEGATIVE
DAT, complement: NEGATIVE

## 2022-04-11 LAB — C4 COMPLEMENT: Complement C4, Body Fluid: 28 mg/dL (ref 12–38)

## 2022-04-11 LAB — CREATININE, URINE, RANDOM: Creatinine, Urine: 145 mg/dL

## 2022-04-11 MED ORDER — POTASSIUM CHLORIDE CRYS ER 20 MEQ PO TBCR
40.0000 meq | EXTENDED_RELEASE_TABLET | Freq: Once | ORAL | Status: AC
  Administered 2022-04-11: 40 meq via ORAL
  Filled 2022-04-11: qty 2

## 2022-04-11 MED ORDER — POTASSIUM CHLORIDE 10 MEQ/50ML IV SOLN
10.0000 meq | INTRAVENOUS | Status: AC
  Administered 2022-04-11 (×4): 10 meq via INTRAVENOUS
  Filled 2022-04-11 (×4): qty 50

## 2022-04-11 MED ORDER — LACTATED RINGERS IV SOLN: INTRAVENOUS | Status: DC

## 2022-04-11 MED ORDER — POTASSIUM CHLORIDE 10 MEQ/100ML IV SOLN
10.0000 meq | INTRAVENOUS | Status: AC
  Administered 2022-04-11 (×6): 10 meq via INTRAVENOUS
  Filled 2022-04-11 (×6): qty 100

## 2022-04-11 MED ORDER — SODIUM CHLORIDE 0.9% FLUSH
10.0000 mL | Freq: Two times a day (BID) | INTRAVENOUS | Status: DC
  Administered 2022-04-12 – 2022-04-22 (×13): 10 mL

## 2022-04-11 MED ORDER — SODIUM CHLORIDE 0.9% FLUSH
10.0000 mL | INTRAVENOUS | Status: DC | PRN
  Administered 2022-04-15: 10 mL

## 2022-04-11 MED ORDER — FAMOTIDINE 20 MG PO TABS
20.0000 mg | ORAL_TABLET | Freq: Every day | ORAL | Status: DC
  Administered 2022-04-12 – 2022-04-22 (×11): 20 mg via ORAL
  Filled 2022-04-11 (×11): qty 1

## 2022-04-11 MED ORDER — ORAL CARE MOUTH RINSE: 15.0000 mL | OROMUCOSAL | Status: DC | PRN

## 2022-04-11 MED ORDER — K PHOS MONO-SOD PHOS DI & MONO 155-852-130 MG PO TABS
500.0000 mg | ORAL_TABLET | ORAL | Status: AC
  Administered 2022-04-11 (×2): 500 mg via ORAL
  Filled 2022-04-11 (×2): qty 2

## 2022-04-11 MED ORDER — POTASSIUM CHLORIDE 10 MEQ/50ML IV SOLN
10.0000 meq | INTRAVENOUS | Status: AC
  Administered 2022-04-11 – 2022-04-12 (×3): 10 meq via INTRAVENOUS
  Filled 2022-04-11 (×3): qty 50

## 2022-04-11 NOTE — Progress Notes (Addendum)
[facility] NEPHROLOGY PROGRESS NOTE  Assessment/ Plan:  [name] is a 46 y.o. person with iron deficiency anemia and hypertension who presented with dizziness and lightheadedness and was admitted for acute kidney injury, anemia, thrombocytopenia, and concern for TTP.   Acute kidney injury Cr slightly improved from 6.54 to 5.72 yesterday. Suspect that her AKI is secondary to hepatorenal syndrome and less likely TTP at this point. PLASMIC score is 4, putting patient at low risk of TTP, although [id number] test is still pending. No need for urgent TPE at this point and no hard indication for renal replacement therapy currently. No evidence of obstruction of kidneys on CT.  - Check urine Na, K, and Cr - C3 and C4 within normal limits, ANA pending (concern for SLE) - Daily BMP - Avoid nephrotoxins  Hypotension BP remains slow with systolic in the 90-100s. Management per primary.   Hypokalemia K remains low at 2.4, despite supplementation yesterday. Mg level 2.7.   - Continue to replete K prn   Anemia Heme/onc consulting, do not think she is hemolyzing as direct Coombs test is negative and patient has no signs of active bleeding. Hb remains low at 7.5 and she is s/p 2u PRBCs. Consider ESA if EPO level is low. Hb is likely low secondary to marrow toxicity from alcohol use and less likely TTP, although labs still pending.  - ADAMTS13 pending - EPO level pending  Thrombocytopenia Platelets remain low at 91. Again, likely secondary to marrow toxicity.  Transaminitis AST/ALT elevated to 136/46. Bili continues to rise, up to 12.0 today. RUQ ultrasound shows evidence of fatty liver disease. Suspect this is secondary to history of heavy alcohol consumption.   Norovirus No diarrhea per patient. Management per primary.   Subjective:    This morning, the patient states that she feels fine. She denies any further episodes of dizziness/lightheadedness and was able to get to  the bedside commode with assistance. She denies any abd pain, n/v/d, melena, or hematochezia.   Objective Vital signs in last 24 hours: Vitals:   04/11/22 0200 04/11/22 0300 04/11/22 0400 04/11/22 0500  BP:  (!) 96/50 (!) 100/53 (!) 100/50  Pulse: 80 70 68 65  Resp: 18 (!) 28 (!) 26 (!) 22  Temp:   98 F (36.7 C)   TempSrc:   Axillary   SpO2: 93% 93% 93% 94%  Weight:   93 kg   Height:       Weight change: -6.2 kg  Intake/Output Summary (Last 24 hours) at 04/11/2022 0725 Last data filed at 04/11/2022 0600 Gross per 24 hour  Intake 1992.18 ml  Output --  Net 1992.18 ml    Labs: Basic Metabolic Panel: Recent Labs  Lab 04/09/22 2242 04/10/22 0905 04/10/22 1638 04/11/22 0205  NA 134* 133*  --  134*  K 1.9* 2.1* 2.2* 2.4*  CL 95* 99  --  105  CO2 22 19*  --  17*  GLUCOSE 124* 109*  --  135*  BUN 72* 72*  --  66*  CREATININE 6.38* 6.54*  --  5.72*  CALCIUM 9.6 8.7*  --  7.5*  PHOS  --   --   --  2.4*   Liver Function Tests: Recent Labs  Lab 04/10/22 0234 04/11/22 0205  AST 130* 136*  ALT 39 46*  ALKPHOS 105 102  BILITOT 10.2* 12.0*  PROT 7.4 6.6  ALBUMIN 3.0* 2.1*   Recent Labs  Lab 04/10/22 0234 04/11/22 0205  LIPASE 267*  103*   No results for input(s): "AMMONIA" in the last 168 hours. CBC: Recent Labs  Lab 04/09/22 2242 04/10/22 0905 04/10/22 1551 04/11/22 0205  WBC 11.4* 9.6 7.7 11.2*  NEUTROABS  --  6.6 5.5 8.4*  HGB 7.0* 6.0* 5.1* 7.5*  HCT 19.6* 16.5* 14.3* 20.7*  MCV 130.7* 129.9* 128.8* 108.9*  PLT 118* 82* 77* 91*   Cardiac Enzymes: Recent Labs  Lab 04/09/22 2320  CKTOTAL 23*   CBG: Recent Labs  Lab 04/09/22 2329 04/10/22 1955  GLUCAP 119* 123*    Iron Studies:  Recent Labs    04/10/22 1551  FERRITIN 834*   Studies/Results: US Abdomen Limited RUQ (LIVER/GB)  Result Date: 04/10/2022 CLINICAL DATA:  Elevated liver function tests EXAM: ULTRASOUND ABDOMEN LIMITED RIGHT UPPER QUADRANT COMPARISON:  CT earlier same day.  FINDINGS: Gallbladder: Surgically absent. Common bile duct: Diameter: Common bile duct could not be identified. There is no intrahepatic ductal dilatation. Liver: Diffusely and markedly echogenic liver parenchyma consistent with the advanced fatty change shown by CT. No identifiable focal lesion. Portal vein is patent on color Doppler imaging with normal direction of blood flow towards the liver. Other: No ascites. IMPRESSION: Previous cholecystectomy. Diffusely echogenic liver consistent with diffuse fatty change. No evidence of intrahepatic ductal dilatation. The common duct could not be identified. Electronically Signed   By: Paulina Fusi M.D.   On: 04/10/2022 19:31   ECHOCARDIOGRAM COMPLETE  Result Date: 04/10/2022    ECHOCARDIOGRAM REPORT   Patient Name:   [name] Date of Exam: 04/10/2022 Medical Rec #:  [id number]        Height:       66.0 in Accession #:    [id number]       Weight:       218.7 lb Date of Birth:  1976/07/07       BSA:          2.077 m Patient Age:    45 years         BP:           90/41 mmHg Patient Gender: F                HR:           69 bpm. Exam Location:  Inpatient Procedure: 2D Echo, Cardiac Doppler and Color Doppler Indications:    Murmur R01.1  History:        Patient has no prior history of Echocardiogram examinations.  Sonographer:    Eulah Pont RDCS Referring Phys: Bevelyn Ngo IMPRESSIONS  1. Left ventricular ejection fraction, by estimation, is >75%. The left ventricle has hyperdynamic function. The left ventricle has no regional wall motion abnormalities. Left ventricular diastolic parameters were normal.  2. Right ventricular systolic function is normal. The right ventricular size is normal. There is normal pulmonary artery systolic pressure.  3. The mitral valve is normal in structure. Trivial mitral valve regurgitation. No evidence of mitral stenosis.  4. The aortic valve was not well visualized. Aortic valve regurgitation is not visualized. No aortic  stenosis is present. Comparison(s): No prior Echocardiogram. Conclusion(s)/Recommendation(s): Otherwise normal echocardiogram, with minor abnormalities described in the report. FINDINGS  Left Ventricle: Left ventricular ejection fraction, by estimation, is >75%. The left ventricle has hyperdynamic function. The left ventricle has no regional wall motion abnormalities. The left ventricular internal cavity size was normal in size. There is no left ventricular hypertrophy. Left ventricular diastolic parameters were normal. Right Ventricle: The right ventricular size  is normal. Right vetricular wall thickness was not well visualized. Right ventricular systolic function is normal. There is normal pulmonary artery systolic pressure. The tricuspid regurgitant velocity is 2.62 m/s, and with an assumed right atrial pressure of 3 mmHg, the estimated right ventricular systolic pressure is 30.5 mmHg. Left Atrium: Left atrial size was normal in size. Right Atrium: Right atrial size was normal in size. Pericardium: There is no evidence of pericardial effusion. Mitral Valve: The mitral valve is normal in structure. Trivial mitral valve regurgitation. No evidence of mitral valve stenosis. Tricuspid Valve: The tricuspid valve is normal in structure. Tricuspid valve regurgitation is trivial. No evidence of tricuspid stenosis. Aortic Valve: The aortic valve was not well visualized. Aortic valve regurgitation is not visualized. No aortic stenosis is present. Pulmonic Valve: The pulmonic valve was not well visualized. Pulmonic valve regurgitation is not visualized. Aorta: The aortic root and ascending aorta are structurally normal, with no evidence of dilitation and the aortic arch was not well visualized. Venous: The inferior vena cava was not well visualized. IAS/Shunts: The atrial septum is grossly normal.  LEFT VENTRICLE PLAX 2D LVIDd:         4.90 cm      Diastology LVIDs:         2.50 cm      LV e' medial:    8.24 cm/s LV PW:          1.10 cm      LV E/e' medial:  16.9 LV IVS:        1.10 cm      LV e' lateral:   10.30 cm/s LVOT diam:     2.00 cm      LV E/e' lateral: 13.5 LV SV:         104 LV SV Index:   50 LVOT Area:     3.14 cm  LV Volumes (MOD) LV vol d, MOD A2C: 103.0 ml LV vol d, MOD A4C: 111.0 ml LV vol s, MOD A2C: 21.9 ml LV vol s, MOD A4C: 22.9 ml LV SV MOD A2C:     81.1 ml LV SV MOD A4C:     111.0 ml LV SV MOD BP:      88.5 ml RIGHT VENTRICLE RV S prime:     12.10 cm/s TAPSE (M-mode): 2.6 cm LEFT ATRIUM             Index        RIGHT ATRIUM           Index LA diam:        4.00 cm 1.93 cm/m   RA Area:     12.10 cm LA Vol (A2C):   52.7 ml 25.37 ml/m  RA Volume:   29.00 ml  13.96 ml/m LA Vol (A4C):   50.2 ml 24.17 ml/m LA Biplane Vol: 54.2 ml 26.09 ml/m  AORTIC VALVE LVOT Vmax:   144.00 cm/s LVOT Vmean:  101.000 cm/s LVOT VTI:    0.331 m  AORTA Ao Root diam: 3.00 cm Ao Asc diam:  3.10 cm MITRAL VALVE                TRICUSPID VALVE MV Area (PHT): 4.60 cm     TR Peak grad:   27.5 mmHg MV Decel Time: 165 msec     TR Vmax:        262.00 cm/s MV E velocity: 139.00 cm/s MV A velocity: 106.00 cm/s  SHUNTS MV E/A ratio:  1.31  Systemic VTI:  0.33 m                             Systemic Diam: 2.00 cm Jodelle Red MD Electronically signed by Jodelle Red MD Signature Date/Time: 04/10/2022/7:16:08 PM    Final    DG Chest Port 1 View  Result Date: 04/10/2022 CLINICAL DATA:  Encounter for central line placement. EXAM: PORTABLE CHEST 1 VIEW COMPARISON:  11/22/2019 FINDINGS: Right jugular dialysis catheter has been placed. Catheter tip in the SVC region. Prominent bandlike density in the mid left lung is most compatible with subsegmental atelectasis. Overall, low lung volumes. Negative for a pneumothorax. Heart size is within normal limits. IMPRESSION: 1. Right jugular central line tip in the SVC region. Negative for pneumothorax. 2. Left lung atelectasis. Electronically Signed   By: Richarda Overlie M.D.   On: 04/10/2022  15:51   CT ABDOMEN PELVIS WO CONTRAST  Result Date: 04/10/2022 CLINICAL DATA:  Nausea vomiting. EXAM: CT ABDOMEN AND PELVIS WITHOUT CONTRAST TECHNIQUE: Multidetector CT imaging of the abdomen and pelvis was performed following the standard protocol without IV contrast. RADIATION DOSE REDUCTION: This exam was performed according to the departmental dose-optimization program which includes automated exposure control, adjustment of the mA and/or kV according to patient size and/or use of iterative reconstruction technique. COMPARISON:  CT abdomen pelvis dated 10/19/2010. FINDINGS: Evaluation of this exam is limited in the absence of intravenous contrast. Lower chest: The visualized lung bases are clear. No intra-abdominal free air or free fluid. Hepatobiliary: Severe fatty liver. No intrahepatic dilatation. A 2 cm rounded structure along the medial left lobe of the liver (27/4) demonstrates similar attenuation as liver parenchyma, likely represent hepatic tissue. Cholecystectomy. Pancreas: Mild haziness adjacent to the uncinate process of the pancreas, possibly related to volume averaging. Correlation with pancreatic enzymes recommended to exclude pancreatitis. No dilatation of the main pancreatic duct or gland atrophy. Spleen: Normal in size without focal abnormality. Adrenals/Urinary Tract: The adrenal glands are unremarkable. The kidneys, visualized ureters, and the urinary bladder appear unremarkable. Stomach/Bowel: Postsurgical changes of gastric bypass. There is no bowel obstruction or active inflammation. The appendix is normal. Vascular/Lymphatic: Mild aortoiliac atherosclerotic disease. The IVC is unremarkable. No portal venous gas. There is no adenopathy. Reproductive: The uterus is anteverted. No adnexal masses. Surgical clips in the region of the left adnexa. Other: None Musculoskeletal: No acute or significant osseous findings. IMPRESSION: 1. Severe fatty liver. 2. Artifact versus less likely mild  acute pancreatitis. Correlation with pancreatic enzymes recommended. 3. No bowel obstruction. Normal appendix. 4. Aortic Atherosclerosis (ICD10-I70.0). Electronically Signed   By: Elgie Collard M.D.   On: 04/10/2022 02:42    Medications: Infusions:  sodium chloride 50 mL/hr at 04/11/22 0600   norepinephrine (LEVOPHED) Adult infusion 5 mcg/min (04/11/22 0600)   potassium chloride 10 mEq (04/11/22 8416)    Scheduled Medications:  Chlorhexidine Gluconate Cloth  6 each Topical Daily   famotidine  20 mg Oral BID   folic acid  2 mg Oral Daily   heparin sodium (porcine)       sodium chloride flush  10-40 mL Intracatheter Q12H    have reviewed scheduled and prn medications.  Physical Exam: General: Resting comfortably, no acute distress. Scleral icterus.  Heart: Regular rate, rhythm. Normal S1, S2. No murmurs. Lungs: Normal respiratory effort. Clear to auscultation bilaterally. Abdomen: Soft, non-tender, non-distended.  Extremities: No edema Skin: No rashes or petechiae.  Neuro: Awake, alert, conversing appropriately. No  focal deficits. Dialysis Access: N/A   Elza Rafter, DO Internal Medicine Resident PGY-2 Pager: [phone] 04/11/2022,7:25 AM  LOS: 1 day

## 2022-04-11 NOTE — Progress Notes (Signed)
It looks like the liver might be getting a little bit worse.  Her bilirubin is up to 12 now.  Her renal function is a little bit better.  The BUN is 66 creatinine 5.7.  Potassium 2.4.  Her white cell count 11.2.  Hemoglobin 7.5.  Platelet count 91,000.  MCV is now down to 109.  Her corrected reticulocyte count is low.  I do not think she is hemolyzing.  Her direct Coombs test is negative.  I have to believe that everything is going be tied to her impending liver failure.  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  She has not bleeding.  She has moved out of the cardiac ICU.  She now is up in medical ICU.  I do not see that she needs a bone marrow test.  Again, I suspect that everything is can be tied to her alcohol use and liver failure.  I suppose she may have hepatorenal syndrome.  The LDH is 147.  For right now, we will see what her erythropoietin level is.  I am sure that it would be on the low side.  If so, that she could probably benefit from ESA to try to help with her anemia.  I really think that if her liver does get better, her blood counts will also improve, particularly her platelet count.  Christin Bach, MD  Jeri Modena 29:11

## 2022-04-11 NOTE — Progress Notes (Signed)
eLink Physician-Brief Progress Note Patient Name: [name] DOB: 02-12-1976 MRN: [id number]   Date of Service  04/11/2022  HPI/Events of Note  K+ 2.4  eICU Interventions  KCL 10 meq iv Q 1 hour x 4 hours ordered        [name] 04/11/2022, 4:02 AM

## 2022-04-11 NOTE — Progress Notes (Signed)
eLink Physician-Brief Progress Note Patient Name: [name] DOB: 1976-04-07 MRN: [id number]   Date of Service  04/11/2022  HPI/Events of Note  K+ 3.2, GFR 9  eICU Interventions  KCL 10 meq iv Q 1 hour x 3 ordered.        Thomasene Lot Freeland Pracht 04/11/2022, 10:36 PM

## 2022-04-11 NOTE — Progress Notes (Signed)
NAME:  [name], MRN:  [id number], DOB:  05/25/1976, LOS: 1 ADMISSION DATE:  04/09/2022, CONSULTATION DATE:  04/10/2022 REFERRING MD:  Horton, CHIEF COMPLAINT:  Acute renal failure, anemia, thrombocytopenia   History of Present Illness:  [name] is a 46 y.o. 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 She was hypotensive to 90/43 on exam, EKG did not show arrhythmia or sign of ischemia.   Initial labs remarkable for K 1.9, creatinine 6.38, Hgb 7.0>6.0, platelets 118>82, BUN/Cr 72/6.54. No signs of active bleeding on exam. CT abdomen obtained showed severe fatty liver, artifact versus mild acute pancreatitis but no bowel obstruction.  She received total of 2L IVF.    She denies any new meds or significant NSAID use.  She denies significant dehydration. atient reports history of renal cell carcinoma in her family. This is concerning for her.  C. She has history of 4 shots of alcohol per day. The kidneys, visualized ureters, and the urinary bladder appear unremarkable.   She is unable to provide a urine sample.  She does state that she is urinating at home.  Second liter of fluids ordered in the ED.  Nephrology has been consulted.Hematology has been consulted. ( Enever)   At time of admission, labs remarkable for Na 133, K 2.1, Cl 99, CO2 19, BUN 72, creatinine 6.54, calcium 8.7, anion gap 15, LDH 217, albumin 3.0, ak phos 105, lipase 267, AST 130, ALT 39, direct bili 5.5, total bili 10.2, CK 23, WBC 9.6, Hgb 6.0, platelets 82, vitamin B 12 1136, iron 181, saturation ratios 48, serum folate 3.2, ferritin 73, methylmalonic acid 193, GI Panel + for Norovirus but asymptomatic.   PCCM have been asked to admit patient to ICU and manage care.  Pertinent  Medical History  Tobacco use disorder GERD Anemia Bariatric surgery HTN EtOH  abuse  Significant Hospital Events: Including procedures, antibiotic start and stop dates in addition to other pertinent events   07/17 Admitted to ICU 07/17 Trialysis catheter placed  Interim History / Subjective:  Patient feels okay this morning, says her dizziness is improved and she denies recurrent diarrheal episodes. Denies bleeding in the stool she had last night and no hematuria this morning.  Objective   Blood pressure (!) 96/50, pulse 68, temperature 98.1 F (36.7 C), temperature source Oral, resp. rate 20, height 5\' 6"  (1.676 m), weight 93 kg, last menstrual period 03/10/2022, SpO2 97 %.        Intake/Output Summary (Last 24 hours) at 04/11/2022 0942 Last data filed at 04/11/2022 0900 Gross per 24 hour  Intake 2357.83 ml  Output 400 ml  Net 1957.83 ml   Filed Weights   04/09/22 2233 04/10/22 1957 04/11/22 0400  Weight: 99.2 kg 93 kg 93 kg    Examination: Constitutional:No acute distress. Eyes:Scleral icterus present. Cardio:Regular rate and rhythm. Flow murmur appreciated. Pulm:Clear to auscultation bilaterally. Normal work of breathing on room air. Abdomen:Soft, non-distended. +hepatomegaly.  04/13/22 for extremity edema. Skin:Warm and dry. No rashes or lesions. Neuro:Alert, oriented, no focal deficit noted. Psych:Normal mood and affect.  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 Flow murmur noted likely due to hypovolemia with anemia. S/p 2 units PRBC. Direct  antiglobulin test negative, ferritin elevated at 834, TIBC 118, reticulocytes are increased but when corrected are low. Pathologist smear review pending however Dr. Myna Hidalgo with hematology noted no schistocytes, decreased platelets in number and size and many target cells on smear 07/17. Total bilirubin/direct  bilirubin/indirect bilirubin continue to increase but haptoglobin is WNL, no signs of hemolysis at this time. -Hematology consulted, appreciate their recommendations  -Clinical picture more likely 2/2 marrow toxicity from alcohol use rather than impending liver failure  -No need for bone marrow testing at this time  -If EPO low, consider ESA -F/u peripheral smear review -Trend CBC, transfuse if Hgb <7   Hypovolemic shock  BP remain soft, fortunately maintaining MAP>65. She appears dry on exam and has had poor PO intake with GI losses and is likely dehydrated as a result. Echocardiogram was largely normal. She does have a new leukocytosis to 11.2 but is afebrile, does not meet SIRS criteria, does not look acutely ill from an infectious standpoint. She has received 2 units PRBC.  -Increase IVF to NS 100 cc/h -Pressor support if needed -F/u legionella, strep pneumo urine studies   Acute kidney injury  Hypokalemia Hyponatremia Hypermagnesemia Oliguria Continued hypokalemia despite repletion, now 2.4. She had reported significant GI losses which is most likely what is driving her persistent hypokalemia. BUN/Cr with mild improvement overnight, 66/5.72. Magnesium was corrected 07/17 and now 2.7. ANA, anti-dsDNA antibody, C3 and C4 WNL. -Nephrology consulted, appreciate their recommendations  -No need for urgent dialysis at this time  -ADAMTS13 pending for TTP work-up -Trend BMP, K q6h and replete as indicated, Mg and replete to maintain >2 -Strict I&O -UA pending  Transaminitis Fatty Liver Disease Elevated LDH, resolved AST/ALT increased overnight, 136/46. Tbili increased to 12.0, direct bili 5.8, indirect bili 6.2. PT/INR prolonged at 19.6/1.7. RUQ [place] with evidence of fatty liver disease, confirmed to be severe by CT abdomen. Hepatic dysfunction in setting of EtOH abuse likely is contributing to anemia and thrombocytopenia complicated by bone marrow toxicity from EtOH abuse. Not showing signs  of withdrawal at this time.  -Trend LFTs -Trend coag panel  Elevated Lipase CT abdomen showed artifact versus less likely mild acute pancreatitis. Lipase has improved to 103 overnight from 267. Elevation could be secondary to acute mild pancreatitis, acute renal failure, alcohol abuse, gastroenteritis. Does not indicate pain to palpation of abdomen, is tolerating PO intake at this time. -CTM   Norovirus -Enteric Precautions  -Supportive care   Tobacco Abuse -Nicotine patch PRN  -Smoking cessation counseling recommended on discharge  Best Practice (right click and "Reselect all SmartList Selections" daily)   Diet/type: Regular consistency (see orders) DVT prophylaxis: SCD GI prophylaxis: H2B Lines: Dialysis Catheter Foley:  N/A Code Status:  full code Last date of multidisciplinary goals of care discussion [07/17]  Labs   CBC: Recent Labs  Lab 04/09/22 2242 04/10/22 0905 04/10/22 1551 04/11/22 0205  WBC 11.4* 9.6 7.7 11.2*  NEUTROABS  --  6.6 5.5 8.4*  HGB 7.0* 6.0* 5.1* 7.5*  HCT 19.6* 16.5* 14.3* 20.7*  MCV 130.7* 129.9* 128.8* 108.9*  PLT 118* 82* 77* 91*    Basic Metabolic Panel: Recent Labs  Lab 04/09/22 2242 04/10/22 0905 04/10/22 1638 04/11/22 0205  NA 134* 133*  --  134*  K 1.9* 2.1* 2.2* 2.4*  CL 95* 99  --  105  CO2 22 19*  --  17*  GLUCOSE 124* 109*  --  135*  BUN 72* 72*  --  66*  CREATININE 6.38* 6.54*  --  5.72*  CALCIUM 9.6 8.7*  --  7.5*  MG 2.5*  --   --  2.7*  PHOS  --   --   --  2.4*   GFR: Estimated Creatinine Clearance: 14.4 mL/min (A) (by C-G formula based on SCr of 5.72 mg/dL (H)). Recent Labs  Lab 04/09/22 2242 04/10/22 0905 04/10/22 1551 04/11/22 0205  WBC 11.4* 9.6 7.7 11.2*  LATICACIDVEN  --   --  1.0  --     Liver Function Tests: Recent Labs  Lab 04/10/22 0234 04/11/22 0205  AST 130* 136*  ALT 39 46*  ALKPHOS 105 102  BILITOT 10.2* 12.0*  PROT 7.4 6.6  ALBUMIN 3.0* 2.1*   Recent Labs  Lab 04/10/22 0234  04/11/22 0205  LIPASE 267* 103*   No results for input(s): "AMMONIA" in the last 168 hours.  ABG    Component Value Date/Time   TCO2 28 10/06/2009 2320     Coagulation Profile: Recent Labs  Lab 04/10/22 1551 04/11/22 0205  INR 1.8* 1.7*    Cardiac Enzymes: Recent Labs  Lab 04/09/22 2320  CKTOTAL 23*    HbA1C: No results found for: "HGBA1C"  CBG: Recent Labs  Lab 04/09/22 2329 04/10/22 1955  GLUCAP 119* 123*    Review of Systems:   Negative unless otherwise stated.  Past Medical History:  She,  has a past medical history of Acid reflux, Anemia, and Ulcer.   Surgical History:   Past Surgical History:  Procedure Laterality Date   ABDOMINAL SURGERY     abdominoplasty   BREAST BIOPSY Right 2013   CHOLECYSTECTOMY N/A 11/23/2019   Procedure: LAPAROSCOPIC CHOLECYSTECTOMY WITH INTRAOPERATIVE CHOLANGIOGRAM;  Surgeon: Sheliah Hatch De Blanch, MD;  Location: [facility] OR;  Service: General;  Laterality: N/A;   COSMETIC SURGERY     tummy tuck   HERNIA REPAIR     ROUX-EN-Y PROCEDURE     TUMOR REMOVAL     WRIST GANGLION EXCISION       Social History:   reports that she has been smoking cigarettes. She has a 6.25 pack-year smoking history. She has never used smokeless tobacco. She reports that she does not currently use alcohol after a past usage of about 12.0 standard drinks of alcohol per week. She reports that she does not use drugs.   Family History:  Her family history includes Breast cancer in her maternal aunt; Cancer in her mother and paternal aunt; Lung cancer in her paternal uncle; Pancreatic cancer in her maternal aunt.   Allergies Allergies  Allergen Reactions   Tape Rash    Clear tape     Home Medications  Prior to Admission medications   Medication Sig Start Date End Date Taking? Authorizing Provider  busPIRone (BUSPAR) 15 MG tablet Take 15 mg by mouth 3 (three) times daily as needed (anxiety). 06/09/21  Yes [provider]  Cyanocobalamin  (B-12 PO) Take 1 tablet by mouth daily.   Yes [provider]  sertraline (ZOLOFT) 100 MG tablet Take 100 mg by mouth as needed. 06/15/21  Yes [provider]  folic acid (FOLVITE) 1 MG tablet Take 1 tablet (1 mg total) by mouth daily. Patient not taking: Reported on 04/11/2022 01/20/22   Georga Kaufmann T, PA-C  gabapentin (NEURONTIN) 300 MG capsule Take 1 capsule (300 mg total) by mouth 3 (three) times daily. Patient not taking: Reported on 04/11/2022 10/04/20   Eustace Moore, MD  valsartan (DIOVAN) 80 MG tablet Take 80 mg by mouth daily. Patient not  taking: Reported on 04/11/2022 06/27/21   [provider]  pantoprazole (PROTONIX) 20 MG tablet Take 1 tablet (20 mg total) by mouth daily for 14 days. 08/05/18 10/04/20  Couture, Cortni S, PA-C     Critical care time: 45 minutes

## 2022-04-12 LAB — BASIC METABOLIC PANEL
Anion gap: 12 (ref 5–15)
Anion gap: 14 (ref 5–15)
BUN: 45 mg/dL — ABNORMAL HIGH (ref 6–20)
BUN: 42 mg/dL — ABNORMAL HIGH (ref 6–20)
CO2: 14 mmol/L — ABNORMAL LOW (ref 22–32)
CO2: 16 mmol/L — ABNORMAL LOW (ref 22–32)
Calcium: 7.6 mg/dL — ABNORMAL LOW (ref 8.9–10.3)
Calcium: 7.6 mg/dL — ABNORMAL LOW (ref 8.9–10.3)
Chloride: 109 mmol/L (ref 98–111)
Chloride: 105 mmol/L (ref 98–111)
Creatinine, Ser: 3.38 mg/dL — ABNORMAL HIGH (ref 0.44–1.00)
Creatinine, Ser: 3.09 mg/dL — ABNORMAL HIGH (ref 0.44–1.00)
GFR, Estimated: 17 mL/min — ABNORMAL LOW (ref >60)
GFR, Estimated: 18 mL/min — ABNORMAL LOW (ref >60)
Glucose, Bld: 134 mg/dL — ABNORMAL HIGH (ref 70–99)
Glucose, Bld: 253 mg/dL — ABNORMAL HIGH (ref 70–99)
Potassium: 3.2 mmol/L — ABNORMAL LOW (ref 3.5–5.1)
Potassium: 3.6 mmol/L (ref 3.5–5.1)
Sodium: 135 mmol/L (ref 135–145)
Sodium: 135 mmol/L (ref 135–145)

## 2022-04-12 LAB — CBC
HCT: 22.4 % — ABNORMAL LOW (ref 36.0–46.0)
HCT: 19.2 % — ABNORMAL LOW (ref 36.0–46.0)
Hemoglobin: 7.9 g/dL — ABNORMAL LOW (ref 12.0–15.0)
Hemoglobin: 7 g/dL — ABNORMAL LOW (ref 12.0–15.0)
MCH: 39.9 pg — ABNORMAL HIGH (ref 26.0–34.0)
MCH: 40.7 pg — ABNORMAL HIGH (ref 26.0–34.0)
MCHC: 35.3 g/dL (ref 30.0–36.0)
MCHC: 36.5 g/dL — ABNORMAL HIGH (ref 30.0–36.0)
MCV: 113.1 fL — ABNORMAL HIGH (ref 80.0–100.0)
MCV: 111.6 fL — ABNORMAL HIGH (ref 80.0–100.0)
Platelets: 79 10*3/uL — ABNORMAL LOW (ref 150–400)
Platelets: 69 10*3/uL — ABNORMAL LOW (ref 150–400)
RBC: 1.98 MIL/uL — ABNORMAL LOW (ref 3.87–5.11)
RBC: 1.72 MIL/uL — ABNORMAL LOW (ref 3.87–5.11)
WBC: 10.1 10*3/uL (ref 4.0–10.5)
WBC: 11.5 10*3/uL — ABNORMAL HIGH (ref 4.0–10.5)
nRBC: 0.5 % — ABNORMAL HIGH (ref 0.0–0.2)
nRBC: 0.3 % — ABNORMAL HIGH (ref 0.0–0.2)

## 2022-04-12 LAB — HEPATIC FUNCTION PANEL
ALT: 52 U/L — ABNORMAL HIGH (ref 0–44)
AST: 144 U/L — ABNORMAL HIGH (ref 15–41)
Albumin: 2.1 g/dL — ABNORMAL LOW (ref 3.5–5.0)
Alkaline Phosphatase: 110 U/L (ref 38–126)
Bilirubin, Direct: 6.2 mg/dL — ABNORMAL HIGH (ref 0.0–0.2)
Indirect Bilirubin: 6 mg/dL — ABNORMAL HIGH (ref 0.3–0.9)
Total Bilirubin: 12.2 mg/dL — ABNORMAL HIGH (ref 0.3–1.2)
Total Protein: 6.8 g/dL (ref 6.5–8.1)

## 2022-04-12 LAB — CBC WITH DIFFERENTIAL/PLATELET
Abs Immature Granulocytes: 0.09 10*3/uL — ABNORMAL HIGH (ref 0.00–0.07)
Basophils Absolute: 0.1 10*3/uL (ref 0.0–0.1)
Basophils Relative: 1 %
Eosinophils Absolute: 0.2 10*3/uL (ref 0.0–0.5)
Eosinophils Relative: 1 %
HCT: 21.2 % — ABNORMAL LOW (ref 36.0–46.0)
Hemoglobin: 7.7 g/dL — ABNORMAL LOW (ref 12.0–15.0)
Immature Granulocytes: 1 %
Lymphocytes Relative: 9 %
Lymphs Abs: 1.1 10*3/uL (ref 0.7–4.0)
MCH: 40.5 pg — ABNORMAL HIGH (ref 26.0–34.0)
MCHC: 36.3 g/dL — ABNORMAL HIGH (ref 30.0–36.0)
MCV: 111.6 fL — ABNORMAL HIGH (ref 80.0–100.0)
Monocytes Absolute: 1 10*3/uL (ref 0.1–1.0)
Monocytes Relative: 8 %
Neutro Abs: 10.2 10*3/uL — ABNORMAL HIGH (ref 1.7–7.7)
Neutrophils Relative %: 80 %
Platelets: 88 10*3/uL — ABNORMAL LOW (ref 150–400)
RBC: 1.9 MIL/uL — ABNORMAL LOW (ref 3.87–5.11)
Smear Review: NORMAL
WBC: 12.6 10*3/uL — ABNORMAL HIGH (ref 4.0–10.5)
nRBC: 0.6 % — ABNORMAL HIGH (ref 0.0–0.2)

## 2022-04-12 LAB — HEPATITIS PANEL, ACUTE
HCV Ab: NONREACTIVE
Hep A IgM: NONREACTIVE
Hep B C IgM: NONREACTIVE
Hepatitis B Surface Ag: NONREACTIVE

## 2022-04-12 LAB — GASTROINTESTINAL PANEL BY PCR, STOOL (REPLACES STOOL CULTURE)

## 2022-04-12 LAB — GLUCOSE, CAPILLARY: Glucose-Capillary: 151 mg/dL — ABNORMAL HIGH (ref 70–99)

## 2022-04-12 LAB — PATHOLOGIST SMEAR REVIEW

## 2022-04-12 LAB — ERYTHROPOIETIN: Erythropoietin: 27.4 m[IU]/mL — ABNORMAL HIGH (ref 2.6–18.5)

## 2022-04-12 LAB — RETICULOCYTES
Immature Retic Fract: 23.8 % — ABNORMAL HIGH (ref 2.3–15.9)
RBC.: 1.91 MIL/uL — ABNORMAL LOW (ref 3.87–5.11)
Retic Count, Absolute: 103.5 10*3/uL (ref 19.0–186.0)
Retic Ct Pct: 5.4 % — ABNORMAL HIGH (ref 0.4–3.1)

## 2022-04-12 LAB — POTASSIUM: Potassium: 3 mmol/L — ABNORMAL LOW (ref 3.5–5.1)

## 2022-04-12 LAB — HEMOGLOBIN AND HEMATOCRIT, BLOOD
HCT: 18 % — ABNORMAL LOW (ref 36.0–46.0)
Hemoglobin: 6.5 g/dL — CL (ref 12.0–15.0)

## 2022-04-12 LAB — LACTIC ACID, PLASMA: Lactic Acid, Venous: 2.9 mmol/L (ref 0.5–1.9)

## 2022-04-12 LAB — LEGIONELLA PNEUMOPHILA SEROGP 1 UR AG: L. pneumophila Serogp 1 Ur Ag: NEGATIVE

## 2022-04-12 LAB — LACTATE DEHYDROGENASE: LDH: 141 U/L (ref 98–192)

## 2022-04-12 LAB — CORTISOL: Cortisol, Plasma: 20.4 ug/dL

## 2022-04-12 MED ORDER — POTASSIUM CHLORIDE 10 MEQ/50ML IV SOLN
10.0000 meq | INTRAVENOUS | Status: AC
  Administered 2022-04-12 (×3): 10 meq via INTRAVENOUS
  Filled 2022-04-12 (×3): qty 50

## 2022-04-12 MED ORDER — SODIUM BICARBONATE 8.4 % IV SOLN
INTRAVENOUS | Status: DC
  Filled 2022-04-12 (×3): qty 1000

## 2022-04-12 MED ORDER — LACTATED RINGERS IV BOLUS: 1000.0000 mL | Freq: Once | INTRAVENOUS | Status: DC

## 2022-04-12 MED ORDER — LOPERAMIDE HCL 2 MG PO CAPS
2.0000 mg | ORAL_CAPSULE | ORAL | Status: DC | PRN
  Administered 2022-04-12 – 2022-04-22 (×11): 2 mg via ORAL
  Filled 2022-04-12 (×11): qty 1

## 2022-04-12 MED ORDER — ALBUMIN HUMAN 25 % IV SOLN
25.0000 g | Freq: Four times a day (QID) | INTRAVENOUS | Status: AC
  Administered 2022-04-12 – 2022-04-13 (×4): 25 g via INTRAVENOUS
  Filled 2022-04-12 (×4): qty 100

## 2022-04-12 MED ORDER — PREDNISOLONE 5 MG PO TABS
40.0000 mg | ORAL_TABLET | Freq: Every day | ORAL | Status: DC
  Administered 2022-04-12 – 2022-04-22 (×11): 40 mg via ORAL
  Filled 2022-04-12 (×11): qty 8

## 2022-04-12 MED ORDER — VASOPRESSIN 20 UNITS/100 ML INFUSION FOR SHOCK
0.0400 [IU]/min | INTRAVENOUS | Status: DC
  Administered 2022-04-12 – 2022-04-13 (×3): 0.04 [IU]/min via INTRAVENOUS
  Filled 2022-04-12 (×4): qty 100

## 2022-04-12 MED ORDER — NOREPINEPHRINE 16 MG/250ML-% IV SOLN
0.0000 ug/min | INTRAVENOUS | Status: DC
  Administered 2022-04-12: 14 ug/min via INTRAVENOUS
  Administered 2022-04-13: 7 ug/min via INTRAVENOUS
  Filled 2022-04-12: qty 250

## 2022-04-12 NOTE — Progress Notes (Signed)
NAME:  [name], MRN:  [id number], DOB:  Feb 15, 1976, LOS: 2 ADMISSION DATE:  04/09/2022, CONSULTATION DATE:  04/10/2022 REFERRING MD:  Horton, CHIEF COMPLAINT:  Acute renal failure, anemia, thrombocytopenia   History of Present Illness:  [name] is a 46 y.o. 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 [name] was hypotensive to 90/43 on exam, EKG did not show arrhythmia or sign of ischemia.   Initial labs remarkable for K 1.9, creatinine 6.38, Hgb 7.0>6.0, platelets 118>82, BUN/Cr 72/6.54. No signs of active bleeding on exam. CT abdomen obtained showed severe fatty liver, artifact versus mild acute pancreatitis but no bowel obstruction.  [name] received total of 2L IVF.    [name] denies any new meds or significant NSAID use.  [name] denies significant dehydration. atient reports history of renal cell carcinoma in her family. This is concerning for her.  C. [name] has history of 4 shots of alcohol per day. The kidneys, visualized ureters, and the urinary bladder appear unremarkable.   [name] is unable to provide a urine sample.  [name] does state that [name] is urinating at home.  Second liter of fluids ordered in the ED.  Nephrology has been consulted.Hematology has been consulted. ( Enever)   At time of admission, labs remarkable for Na 133, K 2.1, Cl 99, CO2 19, BUN 72, creatinine 6.54, calcium 8.7, anion gap 15, LDH 217, albumin 3.0, ak phos 105, lipase 267, AST 130, ALT 39, direct bili 5.5, total bili 10.2, CK 23, WBC 9.6, Hgb 6.0, platelets 82, vitamin B 12 1136, iron 181, saturation ratios 48, serum folate 3.2, ferritin 73, methylmalonic acid 193, GI Panel + for Norovirus but asymptomatic.   PCCM have been asked to admit patient to ICU and manage care.  Pertinent  Medical History  Tobacco use disorder GERD Anemia Bariatric surgery HTN EtOH  abuse  Significant Hospital Events: Including procedures, antibiotic start and stop dates in addition to other pertinent events   07/17 Admitted to ICU 07/17 Trialysis catheter placed  Interim History / Subjective:  Patient feels unwell this morning due to large increase in frequency of diarrhea.   Objective   Blood pressure (!) 119/104, pulse 94, temperature 98.9 F (37.2 C), temperature source Oral, resp. rate (!) 22, height '5\' 6"'  (1.676 m), weight 93 kg, last menstrual period 03/10/2022, SpO2 97 %.        Intake/Output Summary (Last 24 hours) at 04/12/2022 0859 Last data filed at 04/12/2022 0600 Gross per 24 hour  Intake 2459 ml  Output --  Net 2459 ml    Filed Weights   04/09/22 2233 04/10/22 1957 04/11/22 0400  Weight: 99.2 kg 93 kg 93 kg    Examination: Constitutional:No acute distress. Eyes:Scleral icterus present. Cardio:Regular rate and rhythm. Flow murmur appreciated. Pulm:Clear to auscultation bilaterally. Normal work of breathing on room air. Abdomen:Soft, non-distended. +hepatomegaly.  ZDG:LOVFIEPP for extremity edema. Skin:Warm and dry. No rashes or lesions. Neuro:Alert, oriented, no focal deficit noted. Psych:Normal mood and affect.  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 Liver enzymes overall stable, AST with mild increase. Total bilirubin and direct bilirubin with small increases (12.2 and 6.2 respectively), indirect with small decrease (6.0). A/G ratio abnormal  at 0.45 but no hypercalcemia and history of EtOH use. Leading differential remains bone marrow toxicity from alcohol use and calculated low A/G of 0.45 is consistent with liver disease, kidney disease, autoimmune processes, but can also be seen in malignancy and chronic infection. Peripheral smear review  showed pancytopenia with no blasts, RBCs with occasional target cells, and no schistocytes. May benefit from bone marrow biopsy/aspiration this admission if [name] does not show clinical improvement.  -Hematology consulted, appreciate their recommendations  -Clinical picture more likely 2/2 marrow toxicity from alcohol use rather than impending liver failure  -If EPO low, consider ESA -Trend CBC, transfuse if Hgb <7   Norovirus Hypovolemic shock  Patient has required significant increase in levophed to maintain adequate BP, most recently at 17 mcg. [name] has been receiving IVF around the clock for further BP support however [name] has increased loss with frequent diarrhea 2/2 norovirus. Leukocytosis has resolved and blood culture shows no growth to date. GI panel pending at this time, sent out 07/17. Strep pneumo urine antigen negative. -Check AM cortisol -Start albumin 25 g IV q6h, bicarb in D5 100 cc/h -LR 1L bolus -Vasopressin, levophed  -F/u legionella urine studies -Enteric Precautions  -Immodium   Acute kidney injury  Hypokalemia Hyponatremia Hypermagnesemia Continued hypokalemia despite significant repletion 07/18, now 3.0. Unfortunately [name] continues to have significant GI losses with norovirus. Autoimmune work-up unremarkable thus far, ADAMTS13 48.1% and not consistent with TTP. Could be consistent with HUS--[name] has anemia with negative Coomb's test and schistocytes on smear, thrombocytopenia, and AKI. [name] was found to be positive for norovirus on admission but GI panel is pending as it was a send-out. If indeed [name] does have HUS management would be supportive as [name] is currently receiving. UA was largely unremarkable. RTA on differential given BMP 07/18 with low bicarb, low potassium, however would need further urine studies to definitively say and classify type if RTA is present.  -Nephrology consulted, appreciate their recommendations  -No need for urgent dialysis at this time -Trend BMP,  K q6h and replete as indicated, Mg and replete to maintain >2 -Strict I&O  Transaminitis Fatty Liver Disease Elevated LDH, resolved Mild worsening of transaminitis overnight, AST 144/ALT 52/alk phos 110/Tbili 12.2/direct bili 6.2/indirect bili 6.0. Leading differential for current presentation is severe fatty liver disease causing hepatic dysfunction in setting of EtOH abuse that is likely contributing to anemia and thrombocytopenia due to bone marrow toxicity from EtOH abuse.  -Trend LFTs -Trend coag panel  Elevated Lipase Patient has abdominal cramping and GI upset today, though more likely related to norovirus. -CTM   Tobacco Abuse -Nicotine patch PRN  -Smoking cessation counseling recommended on discharge  Best Practice (right click and "Reselect all SmartList Selections" daily)   Diet/type: Regular consistency (see orders) DVT prophylaxis: SCD GI prophylaxis: H2B Lines: Dialysis Catheter Foley:  N/A Code Status:  full code Last date of multidisciplinary goals of care discussion [07/17]  Labs   CBC: Recent Labs  Lab 04/10/22 1551 04/11/22 0205 04/11/22 1113 04/11/22 2115 04/12/22 0327  WBC 7.7 11.2* 12.3* 12.8* 12.6*  NEUTROABS 5.5 8.4* 9.4* 10.1* 10.2*  HGB 5.1* 7.5* 7.7* 7.9* 7.7*  HCT 14.3* 20.7* 21.7* 22.4* 21.2*  MCV 128.8* 108.9* 110.7* 110.9* 111.6*  PLT 77* 91* 93* 91* 88*     Basic Metabolic Panel: Recent Labs  Lab 04/09/22 2242 04/10/22 0905 04/10/22 1638 04/11/22 0205 04/11/22 1113 04/11/22 2115 04/12/22 0327  NA 134* 133*  --  134*  --   --   --  K 1.9* 2.1* 2.2* 2.4* 2.5* 3.2* 3.0*  CL 95* 99  --  105  --   --   --   CO2 22 19*  --  17*  --   --   --   GLUCOSE 124* 109*  --  135*  --   --   --   BUN 72* 72*  --  66*  --   --   --   CREATININE 6.38* 6.54*  --  5.72*  --   --   --   CALCIUM 9.6 8.7*  --  7.5*  --   --   --   MG 2.5*  --   --  2.7*  --   --   --   PHOS  --   --   --  2.4*  --   --   --     GFR: Estimated Creatinine  Clearance: 14.4 mL/min (A) (by C-G formula based on SCr of 5.72 mg/dL (H)). Recent Labs  Lab 04/10/22 1551 04/11/22 0205 04/11/22 1113 04/11/22 2115 04/12/22 0327  WBC 7.7 11.2* 12.3* 12.8* 12.6*  LATICACIDVEN 1.0  --   --   --   --      Liver Function Tests: Recent Labs  Lab 04/10/22 0234 04/11/22 0205 04/12/22 0327  AST 130* 136* 144*  ALT 39 46* 52*  ALKPHOS 105 102 110  BILITOT 10.2* 12.0* 12.2*  PROT 7.4 6.6 6.8  ALBUMIN 3.0* 2.1* 2.1*    Recent Labs  Lab 04/10/22 0234 04/11/22 0205  LIPASE 267* 103*    No results for input(s): "AMMONIA" in the last 168 hours.  ABG    Component Value Date/Time   TCO2 28 10/06/2009 2320     Coagulation Profile: Recent Labs  Lab 04/10/22 1551 04/11/22 0205  INR 1.8* 1.7*     Cardiac Enzymes: Recent Labs  Lab 04/09/22 2320  CKTOTAL 23*     HbA1C: No results found for: "HGBA1C"  CBG: Recent Labs  Lab 04/09/22 2329 04/10/22 1955  GLUCAP 119* 123*     Review of Systems:   Negative unless otherwise stated.  Past Medical History:  [name],  has a past medical history of Acid reflux, Anemia, and Ulcer.   Surgical History:   Past Surgical History:  Procedure Laterality Date   ABDOMINAL SURGERY     abdominoplasty   BREAST BIOPSY Right 2013   CHOLECYSTECTOMY N/A 11/23/2019   Procedure: LAPAROSCOPIC CHOLECYSTECTOMY WITH INTRAOPERATIVE CHOLANGIOGRAM;  Surgeon: Kieth Brightly Arta Bruce, MD;  Location: [place];  Service: General;  Laterality: N/A;   COSMETIC SURGERY     tummy tuck   HERNIA REPAIR     ROUX-EN-Y PROCEDURE     TUMOR REMOVAL     WRIST GANGLION EXCISION       Social History:   reports that [name] has been smoking cigarettes. [name] has a 6.25 pack-year smoking history. [name] has never used smokeless tobacco. [name] reports that [name] does not currently use alcohol after a past usage of about 12.0 standard drinks of alcohol per week. [name] reports that [name] does not use drugs.   Family History:  Her family  history includes Breast cancer in her maternal aunt; Cancer in her mother and paternal aunt; Lung cancer in her paternal uncle; Pancreatic cancer in her maternal aunt.   Allergies Allergies  Allergen Reactions   Tape Rash    Clear tape     Home Medications  Prior to Admission medications   Medication Sig  Start Date End Date Taking? Authorizing Provider  busPIRone (BUSPAR) 15 MG tablet Take 15 mg by mouth 3 (three) times daily as needed (anxiety). 06/09/21  Yes [provider]  Cyanocobalamin (B-12 PO) Take 1 tablet by mouth daily.   Yes [provider]  sertraline (ZOLOFT) 100 MG tablet Take 100 mg by mouth as needed. 06/15/21  Yes [provider]  folic acid (FOLVITE) 1 MG tablet Take 1 tablet (1 mg total) by mouth daily. Patient not taking: Reported on 04/11/2022 01/20/22   Dede Query T, PA-C  gabapentin (NEURONTIN) 300 MG capsule Take 1 capsule (300 mg total) by mouth 3 (three) times daily. Patient not taking: Reported on 04/11/2022 10/04/20   Raylene Everts, MD  valsartan (DIOVAN) 80 MG tablet Take 80 mg by mouth daily. Patient not taking: Reported on 04/11/2022 06/27/21   [provider]  pantoprazole (PROTONIX) 20 MG tablet Take 1 tablet (20 mg total) by mouth daily for 14 days. 08/05/18 10/04/20  Couture, Cortni S, PA-C     Critical care time: 45 minutes

## 2022-04-12 NOTE — Progress Notes (Signed)
eLink Physician-Brief Progress Note Patient Name: JHOANA UPHAM DOB: 1975/11/08 MRN: [id number]   Date of Service  04/12/2022  HPI/Events of Note  K+ 3.0, Cr 5.74  eICU Interventions  KCL 10 meq iv Q 1 hour x 3 doses.        Thomasene Lot Caryn Gienger 04/12/2022, 5:04 AM

## 2022-04-12 NOTE — TOC Progression Note (Signed)
Transition of Care [facility]) - Initial/Assessment Note    Patient Details  Name: [name] MRN: [id number] Date of Birth: 1976-01-26  Transition of Care [facility]) CM/SW Contact:    Ralene Bathe, LCSWA Phone Number: 04/12/2022, 10:32 AM  Clinical Narrative:                  Transition of Care Department [facility]) has reviewed patient and no TOC needs have been identified at this time. We will continue to monitor patient advancement through interdisciplinary progression rounds. If new patient transition needs arise, please place a TOC consult.   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 [place] [address] Phone: [phone] Fax: [phone]     Social Determinants of Health (SDOH) Interventions    Readmission Risk Interventions     No data to display

## 2022-04-12 NOTE — Progress Notes (Signed)
Pt stools x4 over last 8 hours. Type 7 liquid stools. Mixed with urine. Brown and heavy with bilirubin (yellow/orange) when dumped into toilet

## 2022-04-12 NOTE — Progress Notes (Signed)
[facility] Progress Note    Assessment/ Plan:   AKI, improving -initially concerned for TTP vs complement mediated TMA picture-has been evaluated by hematology and does not seem to be the case, PLASMIC score has been low risk therefore TPE not . AKI more likely related to hypotension with concern for HRS especially given very low urine Na and clinical picture -receiving fluids and pressors, agree with albumin infusion. May just need to be started on midodrine if possible -Peak Cr 6.5. Cr improving today, down to 3.38 -Keep MAP greater than 65 to maintain adequate renal perfusion -Based on exam, no indication for renal replacement therapy.  -Avoid nephrotoxic medications including NSAIDs and iodinated intravenous contrast exposure unless the latter is absolutely indicated.  Preferred narcotic agents for pain control are hydromorphone, fentanyl, and methadone. Morphine should not be used. Avoid Baclofen and avoid oral sodium phosphate and magnesium citrate based laxatives / bowel preps. Continue strict Input and Output monitoring. Will monitor the patient closely with you and intervene or adjust therapy as indicated by changes in clinical status/labs   Hypotension h/o HTN -currently requiring pressor support, agree with 1L bolus today  Hypokalemia -continue to replete PRN  Anemia,macrocytic -hgb stable today, hematology following. Possible marrow toxicity from etoh use  Thrombocytopenia -plt count relatively stable today. Hematology following  Norovirus -supportive care per primary service  Elevated LFT's, EtOH abuse -per primary service  Metabolic acidosis, anion gap -corrected anion gap around 16 -agree with bicarb gtt. Will check lactate for completion's sake (possible decrease lactate clearance from liver injury?)  Subjective:   No acute events, met panel pending at the time of my encounter with patient. Patient reports that she is urinating more and actually  ended up urinating on herself last night   Objective:   BP (!) 114/54   Pulse 78   Temp 98.9 F (37.2 C) (Oral)   Resp (!) 31   Ht 5' 6" (1.676 m)   Wt 93 kg   LMP 03/10/2022 (Approximate)   SpO2 95%   BMI 33.09 kg/m   Intake/Output Summary (Last 24 hours) at 04/12/2022 1123 Last data filed at 04/12/2022 0600 Gross per 24 hour  Intake 2240.76 ml  Output --  Net 2240.76 ml   Weight change:   Physical Exam: Gen: NAD HEENT: +scleral icterus  CVS: RRR Resp: cta b/l Abd: soft, nt Ext: no sig edema Neuro: awake, alert  Imaging: US Abdomen Limited RUQ (LIVER/GB)  Result Date: 04/10/2022 CLINICAL DATA:  Elevated liver function tests EXAM: ULTRASOUND ABDOMEN LIMITED RIGHT UPPER QUADRANT COMPARISON:  CT earlier same day. FINDINGS: Gallbladder: Surgically absent. Common bile duct: Diameter: Common bile duct could not be identified. There is no intrahepatic ductal dilatation. Liver: Diffusely and markedly echogenic liver parenchyma consistent with the advanced fatty change shown by CT. No identifiable focal lesion. Portal vein is patent on color Doppler imaging with normal direction of blood flow towards the liver. Other: No ascites. IMPRESSION: Previous cholecystectomy. Diffusely echogenic liver consistent with diffuse fatty change. No evidence of intrahepatic ductal dilatation. The common duct could not be identified. Electronically Signed   By: Mark  Shogry M.D.   On: 04/10/2022 19:31   ECHOCARDIOGRAM COMPLETE  Result Date: 04/10/2022    ECHOCARDIOGRAM REPORT   Patient Name:   [name] Date of Exam: 04/10/2022 Medical Rec #:  [id number]        Height:       66.0 in Accession #:    [id number]         Weight:       218.7 lb Date of Birth:  1976-06-04       BSA:          2.077 m Patient Age:    46 years         BP:           90/41 mmHg Patient Gender: F                HR:           69 bpm. Exam Location:  Inpatient Procedure: 2D Echo, Cardiac Doppler and Color Doppler Indications:     Murmur R01.1  History:        Patient has no prior history of Echocardiogram examinations.  Sonographer:    Bernadene Person RDCS Referring Phys: [place]  1. Left ventricular ejection fraction, by estimation, is >75%. The left ventricle has hyperdynamic function. The left ventricle has no regional wall motion abnormalities. Left ventricular diastolic parameters were normal.  2. Right ventricular systolic function is normal. The right ventricular size is normal. There is normal pulmonary artery systolic pressure.  3. The mitral valve is normal in structure. Trivial mitral valve regurgitation. No evidence of mitral stenosis.  4. The aortic valve was not well visualized. Aortic valve regurgitation is not visualized. No aortic stenosis is present. Comparison(s): No prior Echocardiogram. Conclusion(s)/Recommendation(s): Otherwise normal echocardiogram, with minor abnormalities described in the report. FINDINGS  Left Ventricle: Left ventricular ejection fraction, by estimation, is >75%. The left ventricle has hyperdynamic function. The left ventricle has no regional wall motion abnormalities. The left ventricular internal cavity size was normal in size. There is no left ventricular hypertrophy. Left ventricular diastolic parameters were normal. Right Ventricle: The right ventricular size is normal. Right vetricular wall thickness was not well visualized. Right ventricular systolic function is normal. There is normal pulmonary artery systolic pressure. The tricuspid regurgitant velocity is 2.62 m/s, and with an assumed right atrial pressure of 3 mmHg, the estimated right ventricular systolic pressure is 54.0 mmHg. Left Atrium: Left atrial size was normal in size. Right Atrium: Right atrial size was normal in size. Pericardium: There is no evidence of pericardial effusion. Mitral Valve: The mitral valve is normal in structure. Trivial mitral valve regurgitation. No evidence of mitral valve stenosis.  Tricuspid Valve: The tricuspid valve is normal in structure. Tricuspid valve regurgitation is trivial. No evidence of tricuspid stenosis. Aortic Valve: The aortic valve was not well visualized. Aortic valve regurgitation is not visualized. No aortic stenosis is present. Pulmonic Valve: The pulmonic valve was not well visualized. Pulmonic valve regurgitation is not visualized. Aorta: The aortic root and ascending aorta are structurally normal, with no evidence of dilitation and the aortic arch was not well visualized. Venous: The inferior vena cava was not well visualized. IAS/Shunts: The atrial septum is grossly normal.  LEFT VENTRICLE PLAX 2D LVIDd:         4.90 cm      Diastology LVIDs:         2.50 cm      LV e' medial:    8.24 cm/s LV PW:         1.10 cm      LV E/e' medial:  16.9 LV IVS:        1.10 cm      LV e' lateral:   10.30 cm/s LVOT diam:     2.00 cm      LV E/e' lateral: 13.5 LV SV:  104 LV SV Index:   50 LVOT Area:     3.14 cm  LV Volumes (MOD) LV vol d, MOD A2C: 103.0 ml LV vol d, MOD A4C: 111.0 ml LV vol s, MOD A2C: 21.9 ml LV vol s, MOD A4C: 22.9 ml LV SV MOD A2C:     81.1 ml LV SV MOD A4C:     111.0 ml LV SV MOD BP:      88.5 ml RIGHT VENTRICLE RV S prime:     12.10 cm/s TAPSE (M-mode): 2.6 cm LEFT ATRIUM             Index        RIGHT ATRIUM           Index LA diam:        4.00 cm 1.93 cm/m   RA Area:     12.10 cm LA Vol (A2C):   52.7 ml 25.37 ml/m  RA Volume:   29.00 ml  13.96 ml/m LA Vol (A4C):   50.2 ml 24.17 ml/m LA Biplane Vol: 54.2 ml 26.09 ml/m  AORTIC VALVE LVOT Vmax:   144.00 cm/s LVOT Vmean:  101.000 cm/s LVOT VTI:    0.331 m  AORTA Ao Root diam: 3.00 cm Ao Asc diam:  3.10 cm MITRAL VALVE                TRICUSPID VALVE MV Area (PHT): 4.60 cm     TR Peak grad:   27.5 mmHg MV Decel Time: 165 msec     TR Vmax:        262.00 cm/s MV E velocity: 139.00 cm/s MV A velocity: 106.00 cm/s  SHUNTS MV E/A ratio:  1.31         Systemic VTI:  0.33 m                             Systemic  Diam: 2.00 cm Buford Dresser MD Electronically signed by Buford Dresser MD Signature Date/Time: 04/10/2022/7:16:08 PM    Final    DG Chest Port 1 View  Result Date: 04/10/2022 CLINICAL DATA:  Encounter for central line placement. EXAM: PORTABLE CHEST 1 VIEW COMPARISON:  11/22/2019 FINDINGS: Right jugular dialysis catheter has been placed. Catheter tip in the SVC region. Prominent bandlike density in the mid left lung is most compatible with subsegmental atelectasis. Overall, low lung volumes. Negative for a pneumothorax. Heart size is within normal limits. IMPRESSION: 1. Right jugular central line tip in the SVC region. Negative for pneumothorax. 2. Left lung atelectasis. Electronically Signed   By: Markus Daft M.D.   On: 04/10/2022 15:51    Labs: BMET Recent Labs  Lab 04/09/22 2242 04/10/22 0905 04/10/22 1638 04/11/22 0205 04/11/22 1113 04/11/22 2115 04/12/22 0327  NA 134* 133*  --  134*  --   --   --   K 1.9* 2.1* 2.2* 2.4* 2.5* 3.2* 3.0*  CL 95* 99  --  105  --   --   --   CO2 22 19*  --  17*  --   --   --   GLUCOSE 124* 109*  --  135*  --   --   --   BUN 72* 72*  --  66*  --   --   --   CREATININE 6.38* 6.54*  --  5.72*  --   --   --   CALCIUM 9.6 8.7*  --  7.5*  --   --   --  PHOS  --   --   --  2.4*  --   --   --    CBC Recent Labs  Lab 04/11/22 0205 04/11/22 1113 04/11/22 2115 04/12/22 0327  WBC 11.2* 12.3* 12.8* 12.6*  NEUTROABS 8.4* 9.4* 10.1* 10.2*  HGB 7.5* 7.7* 7.9* 7.7*  HCT 20.7* 21.7* 22.4* 21.2*  MCV 108.9* 110.7* 110.9* 111.6*  PLT 91* 93* 91* 88*    Medications:     Chlorhexidine Gluconate Cloth  6 each Topical Daily   famotidine  20 mg Oral Daily   folic acid  2 mg Oral Daily   sodium chloride flush  10-40 mL Intracatheter Q12H      Gean Quint, MD [facility] 04/12/2022, 11:23 AM

## 2022-04-13 ENCOUNTER — Inpatient Hospital Stay (HOSPITAL_COMMUNITY): Payer: Self-pay

## 2022-04-13 LAB — BASIC METABOLIC PANEL
Anion gap: 13 (ref 5–15)
Anion gap: 12 (ref 5–15)
Anion gap: 15 (ref 5–15)
BUN: 43 mg/dL — ABNORMAL HIGH (ref 6–20)
BUN: 44 mg/dL — ABNORMAL HIGH (ref 6–20)
BUN: 44 mg/dL — ABNORMAL HIGH (ref 6–20)
CO2: 18 mmol/L — ABNORMAL LOW (ref 22–32)
CO2: 21 mmol/L — ABNORMAL LOW (ref 22–32)
CO2: 22 mmol/L (ref 22–32)
Calcium: 7.4 mg/dL — ABNORMAL LOW (ref 8.9–10.3)
Calcium: 7.9 mg/dL — ABNORMAL LOW (ref 8.9–10.3)
Calcium: 7.6 mg/dL — ABNORMAL LOW (ref 8.9–10.3)
Chloride: 104 mmol/L (ref 98–111)
Chloride: 102 mmol/L (ref 98–111)
Chloride: 99 mmol/L (ref 98–111)
Creatinine, Ser: 2.99 mg/dL — ABNORMAL HIGH (ref 0.44–1.00)
Creatinine, Ser: 2.8 mg/dL — ABNORMAL HIGH (ref 0.44–1.00)
Creatinine, Ser: 2.46 mg/dL — ABNORMAL HIGH (ref 0.44–1.00)
GFR, Estimated: 19 mL/min — ABNORMAL LOW (ref >60)
GFR, Estimated: 21 mL/min — ABNORMAL LOW (ref >60)
GFR, Estimated: 24 mL/min — ABNORMAL LOW (ref >60)
Glucose, Bld: 293 mg/dL — ABNORMAL HIGH (ref 70–99)
Glucose, Bld: 186 mg/dL — ABNORMAL HIGH (ref 70–99)
Glucose, Bld: 130 mg/dL — ABNORMAL HIGH (ref 70–99)
Potassium: 3.1 mmol/L — ABNORMAL LOW (ref 3.5–5.1)
Potassium: 2.6 mmol/L — CL (ref 3.5–5.1)
Potassium: 3.2 mmol/L — ABNORMAL LOW (ref 3.5–5.1)
Sodium: 135 mmol/L (ref 135–145)
Sodium: 135 mmol/L (ref 135–145)
Sodium: 136 mmol/L (ref 135–145)

## 2022-04-13 LAB — CBC
HCT: 21.1 % — ABNORMAL LOW (ref 36.0–46.0)
HCT: 21.2 % — ABNORMAL LOW (ref 36.0–46.0)
HCT: 22.1 % — ABNORMAL LOW (ref 36.0–46.0)
Hemoglobin: 7.4 g/dL — ABNORMAL LOW (ref 12.0–15.0)
Hemoglobin: 7.7 g/dL — ABNORMAL LOW (ref 12.0–15.0)
Hemoglobin: 8.1 g/dL — ABNORMAL LOW (ref 12.0–15.0)
MCH: 38.7 pg — ABNORMAL HIGH (ref 26.0–34.0)
MCH: 39.3 pg — ABNORMAL HIGH (ref 26.0–34.0)
MCH: 39.5 pg — ABNORMAL HIGH (ref 26.0–34.0)
MCHC: 35.1 g/dL (ref 30.0–36.0)
MCHC: 36.3 g/dL — ABNORMAL HIGH (ref 30.0–36.0)
MCHC: 36.7 g/dL — ABNORMAL HIGH (ref 30.0–36.0)
MCV: 110.5 fL — ABNORMAL HIGH (ref 80.0–100.0)
MCV: 108.2 fL — ABNORMAL HIGH (ref 80.0–100.0)
MCV: 107.8 fL — ABNORMAL HIGH (ref 80.0–100.0)
Platelets: 59 10*3/uL — ABNORMAL LOW (ref 150–400)
Platelets: 68 10*3/uL — ABNORMAL LOW (ref 150–400)
Platelets: 73 10*3/uL — ABNORMAL LOW (ref 150–400)
RBC: 1.91 MIL/uL — ABNORMAL LOW (ref 3.87–5.11)
RBC: 1.96 MIL/uL — ABNORMAL LOW (ref 3.87–5.11)
RBC: 2.05 MIL/uL — ABNORMAL LOW (ref 3.87–5.11)
WBC: 10.5 10*3/uL (ref 4.0–10.5)
WBC: 12.1 10*3/uL — ABNORMAL HIGH (ref 4.0–10.5)
WBC: 13.6 10*3/uL — ABNORMAL HIGH (ref 4.0–10.5)
nRBC: 0.3 % — ABNORMAL HIGH (ref 0.0–0.2)
nRBC: 0.2 % (ref 0.0–0.2)
nRBC: 0.3 % — ABNORMAL HIGH (ref 0.0–0.2)

## 2022-04-13 LAB — POCT I-STAT 7, (LYTES, BLD GAS, ICA,H+H)
Acid-Base Excess: 1 mmol/L (ref 0.0–2.0)
Bicarbonate: 25 mmol/L (ref 20.0–28.0)
Calcium, Ion: 0.97 mmol/L — ABNORMAL LOW (ref 1.15–1.40)
HCT: 26 % — ABNORMAL LOW (ref 36.0–46.0)
Hemoglobin: 8.8 g/dL — ABNORMAL LOW (ref 12.0–15.0)
O2 Saturation: 92 %
Patient temperature: 97.6
Potassium: 3.8 mmol/L (ref 3.5–5.1)
Sodium: 139 mmol/L (ref 135–145)
TCO2: 26 mmol/L (ref 22–32)
pCO2 arterial: 33.4 mmHg (ref 32–48)
pH, Arterial: 7.481 — ABNORMAL HIGH (ref 7.35–7.45)
pO2, Arterial: 56 mmHg — ABNORMAL LOW (ref 83–108)

## 2022-04-13 LAB — HEMOGLOBIN A1C
Hgb A1c MFr Bld: 3.8 % — ABNORMAL LOW (ref 4.8–5.6)
Mean Plasma Glucose: 62.36 mg/dL

## 2022-04-13 LAB — RETICULOCYTES
Immature Retic Fract: 13.9 % (ref 2.3–15.9)
RBC.: 1.92 MIL/uL — ABNORMAL LOW (ref 3.87–5.11)
Retic Count, Absolute: 81.4 10*3/uL (ref 19.0–186.0)
Retic Ct Pct: 4.2 % — ABNORMAL HIGH (ref 0.4–3.1)

## 2022-04-13 LAB — HEPATIC FUNCTION PANEL
ALT: 48 U/L — ABNORMAL HIGH (ref 0–44)
AST: 111 U/L — ABNORMAL HIGH (ref 15–41)
Albumin: 3.3 g/dL — ABNORMAL LOW (ref 3.5–5.0)
Alkaline Phosphatase: 89 U/L (ref 38–126)
Bilirubin, Direct: 6.4 mg/dL — ABNORMAL HIGH (ref 0.0–0.2)
Indirect Bilirubin: 6.8 mg/dL — ABNORMAL HIGH (ref 0.3–0.9)
Total Bilirubin: 13.2 mg/dL — ABNORMAL HIGH (ref 0.3–1.2)
Total Protein: 7.6 g/dL (ref 6.5–8.1)

## 2022-04-13 LAB — PREPARE RBC (CROSSMATCH)

## 2022-04-13 LAB — MAGNESIUM: Magnesium: 2.2 mg/dL (ref 1.7–2.4)

## 2022-04-13 LAB — GLUCOSE, CAPILLARY
Glucose-Capillary: 167 mg/dL — ABNORMAL HIGH (ref 70–99)
Glucose-Capillary: 179 mg/dL — ABNORMAL HIGH (ref 70–99)
Glucose-Capillary: 142 mg/dL — ABNORMAL HIGH (ref 70–99)

## 2022-04-13 LAB — LACTATE DEHYDROGENASE: LDH: 137 U/L (ref 98–192)

## 2022-04-13 LAB — OCCULT BLOOD X 1 CARD TO LAB, STOOL: Fecal Occult Bld: NEGATIVE

## 2022-04-13 MED ORDER — POTASSIUM CHLORIDE 10 MEQ/100ML IV SOLN: 10.0000 meq | INTRAVENOUS | Status: DC

## 2022-04-13 MED ORDER — SODIUM CHLORIDE 0.9% IV SOLUTION: Freq: Once | INTRAVENOUS | Status: AC

## 2022-04-13 MED ORDER — SODIUM CHLORIDE 0.9 % IV SOLN
6.2500 mg | Freq: Four times a day (QID) | INTRAVENOUS | Status: DC | PRN
  Administered 2022-04-14 – 2022-04-17 (×3): 6.25 mg via INTRAVENOUS
  Filled 2022-04-13 (×2): qty 0.25

## 2022-04-13 MED ORDER — SODIUM CHLORIDE 0.9 % IV SOLN
12.5000 mg | Freq: Four times a day (QID) | INTRAVENOUS | Status: DC | PRN
  Filled 2022-04-13: qty 0.5

## 2022-04-13 MED ORDER — FUROSEMIDE 10 MG/ML IJ SOLN
40.0000 mg | Freq: Once | INTRAMUSCULAR | Status: AC
  Administered 2022-04-13: 40 mg via INTRAVENOUS
  Filled 2022-04-13: qty 4

## 2022-04-13 MED ORDER — POTASSIUM CHLORIDE 20 MEQ PO PACK: 20.0000 meq | PACK | ORAL | Status: DC

## 2022-04-13 MED ORDER — STERILE WATER FOR INJECTION IV SOLN
INTRAVENOUS | Status: DC
  Filled 2022-04-13 (×2): qty 1000
  Filled 2022-04-13: qty 150

## 2022-04-13 MED ORDER — CALCIUM GLUCONATE-NACL 2-0.675 GM/100ML-% IV SOLN
2.0000 g | Freq: Once | INTRAVENOUS | Status: AC
  Administered 2022-04-13: 2000 mg via INTRAVENOUS
  Filled 2022-04-13: qty 100

## 2022-04-13 MED ORDER — INSULIN ASPART 100 UNIT/ML IJ SOLN
0.0000 [IU] | Freq: Three times a day (TID) | INTRAMUSCULAR | Status: DC
  Administered 2022-04-13: 3 [IU] via SUBCUTANEOUS
  Administered 2022-04-14 – 2022-04-16 (×7): 2 [IU] via SUBCUTANEOUS
  Administered 2022-04-16: 5 [IU] via SUBCUTANEOUS
  Administered 2022-04-17 (×2): 2 [IU] via SUBCUTANEOUS
  Administered 2022-04-17: 3 [IU] via SUBCUTANEOUS
  Administered 2022-04-18: 2 [IU] via SUBCUTANEOUS
  Administered 2022-04-19: 3 [IU] via SUBCUTANEOUS
  Administered 2022-04-19: 2 [IU] via SUBCUTANEOUS
  Administered 2022-04-20 – 2022-04-21 (×2): 3 [IU] via SUBCUTANEOUS
  Administered 2022-04-21 (×2): 2 [IU] via SUBCUTANEOUS
  Administered 2022-04-22: 3 [IU] via SUBCUTANEOUS

## 2022-04-13 MED ORDER — POTASSIUM CHLORIDE 10 MEQ/100ML IV SOLN
10.0000 meq | INTRAVENOUS | Status: AC
  Administered 2022-04-13 (×6): 10 meq via INTRAVENOUS
  Filled 2022-04-13 (×6): qty 100

## 2022-04-13 MED ORDER — INSULIN ASPART 100 UNIT/ML IJ SOLN: 0.0000 [IU] | Freq: Every day | INTRAMUSCULAR | Status: DC

## 2022-04-13 NOTE — Progress Notes (Signed)
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 AKI is likely secondary to hepatorenal syndrome and hypotension. Cr peaked at 6.5 and is improved to 2.99 today. Still no indication for renal replacement therapy at this point.  - Continue fluids and pressor support to maintain MAP >65 - Avoid nephrotoxic medications including NSAIDs and iodinated contrast, unless absolutely necessary.  Preferred narcotic agents for pain control are hydromorphone, fentanyl, and methadone. Morphine should not be used. Avoid Baclofen and avoid oral sodium phosphate and magnesium citrate based laxatives / bowel preps. Continue strict Input and Output monitoring. Will monitor the patient closely with you and intervene or adjust therapy as indicated by changes in clinical status/labs   Hypotension Hypovolemic shock Patient has history of hypertension, although she is currently requiring pressor support at this time. Patient is on both levo and vaso, as well as albumin and bicarb drips.   Hypokalemia K 3.1 this morning.  - Replete as needed  Macrocytic anemia Hb 7.4 this morning, patient received 1u PRBC overnight. Likely secondary to marrow toxicity from alcohol use.  - Blood smear in process  Thrombocytopenia Platelets continue to decline, down to 59 today. Hematology service is following.  Norovirus Patient continues to have profuse diarrhea. On pressor and fluids as above. Supportive care per primary service.   Transaminitis AST/ALT slightly improved from yesterday, although total bili continues to rise, up to 13.2 today. Continued workup and management per primary.   Anion gap metabolic acidosis Lactic acidosis Bicarb slightly improved to 18 today. Lactic acid elevated to 2.9 yesterday.  - Agree with bicarb  gtt.     Subjective:    This morning, the patient denies any changes in her condition. She continues to have profuse diarrhea, although denies any hematochezia or melena. The patient does endorse some lower extremity edema.   Objective Vital signs in last 24 hours: Vitals:   04/13/22 0300 04/13/22 0400 04/13/22 0500 04/13/22 0719  BP: 124/69 115/64 (!) 101/56   Pulse: 89 (!) 55 (!) 54   Resp: 17 (!) 21 18   Temp:    98.2 F (36.8 C)  TempSrc:    Oral  SpO2: 100% 96% 99%   Weight:   99.1 kg   Height:       Weight change:   Intake/Output Summary (Last 24 hours) at 04/13/2022 0839 Last data filed at 04/13/2022 0500 Gross per 24 hour  Intake 4503.94 ml  Output 4 ml  Net 4499.94 ml    Labs: Basic Metabolic Panel: Recent Labs  Lab 04/11/22 0205 04/11/22 1113 04/12/22 1015 04/12/22 2016 04/13/22 0425  NA 134*  --  135 135 135  K 2.4*   < > 3.2* 3.6 3.1*  CL 105  --  109 105 104  CO2 17*  --  14* 16* 18*  GLUCOSE 135*  --  134* 253* 293*  BUN 66*  --  45* 42* 43*  CREATININE 5.72*  --  3.38* 3.09* 2.99*  CALCIUM 7.5*  --  7.6* 7.6* 7.4*  PHOS 2.4*  --   --   --   --    < > = values in this interval not displayed.   Liver Function Tests: Recent Labs  Lab 04/11/22 0205 04/12/22 0327 04/13/22 0425  AST 136* 144* 111*  ALT 46* 52* 48*  ALKPHOS 102 110 89  BILITOT 12.0* 12.2* 13.2*  PROT 6.6 6.8 7.6  ALBUMIN 2.1* 2.1* 3.3*   Recent Labs  Lab 04/10/22 0234 04/11/22 0205  LIPASE 267* 103*   No results for input(s): "AMMONIA" in the last 168 hours. CBC: Recent Labs  Lab 04/11/22 1113 04/11/22 2115 04/12/22 0327 04/12/22 1015 04/12/22 2016 04/12/22 2252 04/13/22 0425  WBC 12.3* 12.8* 12.6* 10.1 11.5*  --  10.5  NEUTROABS 9.4* 10.1* 10.2*  --   --   --   --   HGB 7.7* 7.9* 7.7* 7.9* 7.0* 6.5* 7.4*  HCT 21.7* 22.4* 21.2* 22.4* 19.2* 18.0* 21.1*  MCV 110.7* 110.9* 111.6* 113.1* 111.6*  --  110.5*  PLT 93* 91* 88* 79* 69*  --  59*   Cardiac  Enzymes: Recent Labs  Lab 04/09/22 2320  CKTOTAL 23*   CBG: Recent Labs  Lab 04/09/22 2329 04/10/22 1955 04/12/22 1139  GLUCAP 119* 123* 151*    Iron Studies:  Recent Labs    04/10/22 1551  IRON 65  TIBC 118*  FERRITIN 834*   Studies/Results: No results found.  Medications: Infusions:  lactated ringers Stopped (04/12/22 1110)   norepinephrine (LEVOPHED) Adult infusion 7 mcg/min (04/13/22 0551)   sodium bicarbonate 150 mEq in dextrose 5 % 1,150 mL infusion 100 mL/hr at 04/13/22 0500   vasopressin 0.04 Units/min (04/13/22 0500)    Scheduled Medications:  Chlorhexidine Gluconate Cloth  6 each Topical Daily   famotidine  20 mg Oral Daily   folic acid  2 mg Oral Daily   prednisoLONE  40 mg Oral Daily   sodium chloride flush  10-40 mL Intracatheter Q12H    have reviewed scheduled and prn medications.  Physical Exam: General: Resting comfortably, no acute distress Heart: Regular rate, rhythm. No murmurs. Lungs: Normal respiratory effort. Clear to auscultation bilaterally. Abdomen: Soft, non-tender, non-distended. Extremities: Trace pedal edema.  Neuro: Awake, alert, conversing appropriately. No focal deficits.   Elza Rafter, DO Internal Medicine Resident PGY-2 Pager: [phone] 04/13/2022,8:39 AM  LOS: 3 days

## 2022-04-13 NOTE — Progress Notes (Addendum)
NAME:  [name], MRN:  [id number], DOB:  12-29-1975, LOS: 3 ADMISSION DATE:  04/09/2022, CONSULTATION DATE:  04/10/2022 REFERRING MD:  Horton, CHIEF COMPLAINT:  Acute renal failure, anemia, thrombocytopenia   History of Present Illness:  [name] is a 46 y.o. 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 She was hypotensive to 90/43 on exam, EKG did not show arrhythmia or sign of ischemia.   Initial labs remarkable for K 1.9, creatinine 6.38, Hgb 7.0>6.0, platelets 118>82, BUN/Cr 72/6.54. No signs of active bleeding on exam. CT abdomen obtained showed severe fatty liver, artifact versus mild acute pancreatitis but no bowel obstruction.  She received total of 2L IVF.    She denies any new meds or significant NSAID use.  She denies significant dehydration. atient reports history of renal cell carcinoma in her family. This is concerning for her.  C. She has history of 4 shots of alcohol per day. The kidneys, visualized ureters, and the urinary bladder appear unremarkable.   She is unable to provide a urine sample.  She does state that she is urinating at home.  Second liter of fluids ordered in the ED.  Nephrology has been consulted.Hematology has been consulted. ( [place])   At time of admission, labs remarkable for Na 133, K 2.1, Cl 99, CO2 19, BUN 72, creatinine 6.54, calcium 8.7, anion gap 15, LDH 217, albumin 3.0, ak phos 105, lipase 267, AST 130, ALT 39, direct bili 5.5, total bili 10.2, CK 23, WBC 9.6, Hgb 6.0, platelets 82, vitamin B 12 1136, iron 181, saturation ratios 48, serum folate 3.2, ferritin 73, methylmalonic acid 193, GI Panel + for Norovirus but asymptomatic.   PCCM have been asked to admit patient to ICU and manage care.  Pertinent  Medical History  Tobacco use disorder GERD Anemia Bariatric surgery HTN EtOH  abuse  Significant Hospital Events: Including procedures, antibiotic start and stop dates in addition to other pertinent events   07/17 Admitted to ICU 07/17 Trialysis catheter placed  Interim History / Subjective:  Patient feels largely unchanged from yesterday and continues to have diarrhea. She has bilateral LE edema, R>L that is worse than prior.  Objective   Blood pressure (!) 101/56, pulse (!) 54, temperature 98.2 F (36.8 C), temperature source Oral, resp. rate 18, height 5\' 6"  (1.676 m), weight 99.1 kg, last menstrual period 03/10/2022, SpO2 99 %.        Intake/Output Summary (Last 24 hours) at 04/13/2022 0825 Last data filed at 04/13/2022 0500 Gross per 24 hour  Intake 4503.94 ml  Output 4 ml  Net 4499.94 ml    Filed Weights   04/10/22 1957 04/11/22 0400 04/13/22 0500  Weight: 93 kg 93 kg 99.1 kg    Examination: Constitutional:No acute distress. Eyes:Scleral icterus present. Cardio:Regular rate and rhythm. Flow murmur appreciated. Pulm:Clear to auscultation bilaterally. Normal work of breathing on room air. Abdomen:Soft, non-distended. +hepatomegaly.  04/15/22 for extremity edema. Skin:Warm and dry. No definite rashes or lesions. Skin is jaundiced. Neuro:Alert, oriented, no focal deficit noted. Psych:Normal mood and affect.  Resolved Hospital Problem list   Hyponatremia Elevated LDH  Assessment & Plan:  Acute on chronic anemia without hemolysis Thrombocytopenia  Possible marrow toxicity from EtOH use Patient required 1 unit PRBC transfusion overnight due to acute drop in hemoglobin to 6.5. Hemodynamically stable on exam though she is on pressor support.  EPO elevated to 27.4. No signs of bleeding on exam or in stool, urine.  -Hematology consulted, appreciate their recommendations  -Clinical picture more likely 2/2 marrow toxicity from alcohol use rather than impending liver failure -Trend CBC, transfuse if Hgb <7 -F/u blood smear   Hypovolemic shock   Norovirus Hypokalemia BP more stable over last 24h with addition of vasopressin, now down to 7 mcg of levophed in addition to 0.0.4 vasopressin. BP stable with this support and repletion of blood. She continues to have volume loss through diarrhea. No leukocytosis or fever, strep and legionella urine studies negative, blood culture showing no growth at 3 days, AM cortisol normal. K stable but remains low at 3.1. 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  -Nephrology consulted, appreciate their recommendations -Trend BMP, K and replete as indicated, Mg and replete to maintain >2 -Strict I&O  Transaminitis Fatty Liver Disease Elevated LDH, resolved Maddrey score of 50.9, indicating poor prognosis. Prednisolone 40 mg daily started 07/19 with some improvement in AST/ALT however total, direct, and indirect bilirubin continue to rise. I am hopeful that prednisolone will continue to improve liver function and there will be stabilization of anemia and thrombocytopenia as well. -Prednisolone 40 mg daily -Check Lille score 07/26 and reassess need for 28  day course versus discontinuation of prednisolone -Trend LFTs -Trend coag panel   Tobacco Abuse -Nicotine patch PRN  -Smoking cessation counseling recommended on discharge  Best Practice (right click and "Reselect all SmartList Selections" daily)   Diet/type: Regular consistency (see orders) DVT prophylaxis: SCD GI prophylaxis: H2B Lines: Dialysis Catheter Foley:  N/A Code Status:  full code Last date of multidisciplinary goals of care discussion [07/17]  Labs   CBC: Recent Labs  Lab 04/10/22 1551 04/11/22 0205 04/11/22 1113 04/11/22 2115 04/12/22 0327 04/12/22 1015 04/12/22 2016 04/12/22 2252 04/13/22 0425  WBC 7.7 11.2* 12.3* 12.8* 12.6* 10.1 11.5*  --   10.5  NEUTROABS 5.5 8.4* 9.4* 10.1* 10.2*  --   --   --   --   HGB 5.1* 7.5* 7.7* 7.9* 7.7* 7.9* 7.0* 6.5* 7.4*  HCT 14.3* 20.7* 21.7* 22.4* 21.2* 22.4* 19.2* 18.0* 21.1*  MCV 128.8* 108.9* 110.7* 110.9* 111.6* 113.1* 111.6*  --  110.5*  PLT 77* 91* 93* 91* 88* 79* 69*  --  59*     Basic Metabolic Panel: Recent Labs  Lab 04/09/22 2242 04/10/22 0905 04/10/22 1638 04/11/22 0205 04/11/22 1113 04/11/22 2115 04/12/22 0327 04/12/22 1015 04/12/22 2016 04/13/22 0425  NA 134* 133*  --  134*  --   --   --  135 135 135  K 1.9* 2.1*   < > 2.4*   < > 3.2* 3.0* 3.2* 3.6 3.1*  CL 95* 99  --  105  --   --   --  109 105 104  CO2 22 19*  --  17*  --   --   --  14* 16* 18*  GLUCOSE 124* 109*  --  135*  --   --   --  134* 253* 293*  BUN 72* 72*  --  66*  --   --   --  45* 42* 43*  CREATININE 6.38* 6.54*  --  5.72*  --   --   --  3.38* 3.09* 2.99*  CALCIUM 9.6 8.7*  --  7.5*  --   --   --  7.6* 7.6* 7.4*  MG 2.5*  --   --  2.7*  --   --   --   --   --   --   PHOS  --   --   --  2.4*  --   --   --   --   --   --    < > = values in this interval not displayed.    GFR: Estimated Creatinine Clearance: 28.5 mL/min (A) (by C-G formula based on SCr of 2.99 mg/dL (H)). Recent Labs  Lab 04/10/22 1551 04/11/22 0205 04/12/22 0327 04/12/22 1015 04/12/22 1150 04/12/22 2016 04/13/22 0425  WBC 7.7   < > 12.6* 10.1  --  11.5* 10.5  LATICACIDVEN 1.0  --   --   --  2.9*  --   --    < > = values in this interval not displayed.     Liver Function Tests: Recent Labs  Lab 04/10/22 0234 04/11/22 0205 04/12/22 0327 04/13/22 0425  AST 130* 136* 144* 111*  ALT 39 46* 52* 48*  ALKPHOS 105 102 110 89  BILITOT 10.2* 12.0* 12.2* 13.2*  PROT 7.4 6.6 6.8 7.6  ALBUMIN 3.0* 2.1* 2.1* 3.3*    Recent Labs  Lab 04/10/22 0234 04/11/22 0205  LIPASE 267* 103*    No results for input(s): "AMMONIA" in the last 168 hours.  ABG    Component Value Date/Time   TCO2 28 10/06/2009 2320     Coagulation  Profile: Recent Labs  Lab 04/10/22 1551 04/11/22 0205  INR 1.8* 1.7*     Cardiac Enzymes: Recent Labs  Lab 04/09/22 2320  CKTOTAL 23*     HbA1C: No results found for: "HGBA1C"  CBG: Recent Labs  Lab 04/09/22 2329 04/10/22 1955 04/12/22 1139  GLUCAP 119* 123* 151*     Review of Systems:   Negative unless otherwise stated.  Past Medical History:  She,  has a past medical history of Acid reflux, Anemia, and Ulcer.   Surgical History:   Past Surgical History:  Procedure Laterality Date   ABDOMINAL SURGERY     abdominoplasty   BREAST BIOPSY Right 2013   CHOLECYSTECTOMY N/A 11/23/2019   Procedure: LAPAROSCOPIC CHOLECYSTECTOMY WITH INTRAOPERATIVE CHOLANGIOGRAM;  Surgeon: Sheliah Hatch De Blanch, MD;  Location: [facility] OR;  Service: General;  Laterality: N/A;   COSMETIC SURGERY     tummy tuck   HERNIA REPAIR     ROUX-EN-Y PROCEDURE     TUMOR REMOVAL     WRIST GANGLION EXCISION       Social History:   reports that she has been smoking cigarettes. She has a 6.25 pack-year smoking history. She has never used smokeless tobacco. She reports that she does not currently use alcohol after a past usage of about 12.0 standard drinks of alcohol per week. She reports that she does not use drugs.   Family History:  Her family history includes Breast cancer in her maternal aunt; Cancer in her mother and paternal aunt; Lung cancer in her paternal uncle; Pancreatic cancer in her maternal aunt.   Allergies Allergies  Allergen Reactions   Tape Rash    Clear tape     Home Medications  Prior to Admission medications   Medication Sig Start Date End Date Taking? Authorizing Provider  busPIRone (BUSPAR) 15 MG tablet Take 15 mg by mouth 3 (three) times daily as needed (anxiety). 06/09/21  Yes [provider]  Cyanocobalamin (B-12 PO) Take 1 tablet by mouth daily.  Yes [provider]  sertraline (ZOLOFT) 100 MG tablet Take 100 mg by mouth as needed. 06/15/21  Yes  [provider]  folic acid (FOLVITE) 1 MG tablet Take 1 tablet (1 mg total) by mouth daily. Patient not taking: Reported on 04/11/2022 01/20/22   Georga Kaufmann T, PA-C  gabapentin (NEURONTIN) 300 MG capsule Take 1 capsule (300 mg total) by mouth 3 (three) times daily. Patient not taking: Reported on 04/11/2022 10/04/20   Eustace Moore, MD  valsartan (DIOVAN) 80 MG tablet Take 80 mg by mouth daily. Patient not taking: Reported on 04/11/2022 06/27/21   [provider]  pantoprazole (PROTONIX) 20 MG tablet Take 1 tablet (20 mg total) by mouth daily for 14 days. 08/05/18 10/04/20  Couture, Cortni S, PA-C     Critical care time: 45 minutes

## 2022-04-13 NOTE — Progress Notes (Addendum)
eLink Physician-Brief Progress Note Patient Name: [name] DOB: 1976-02-01 MRN: [id number]   Date of Service  04/13/2022  HPI/Events of Note  Patient c/o difficulty breathing. Sat = 80's on room air. Patient being placed on [place] O2. Currently on a NaHCO3 IV infusion at 100 mL/hour. Nursing reports I/O is 12.2 liters positive since admission.  eICU Interventions  Plan: Portable CXR STAT. ABG STAT.      Intervention Category Major Interventions: Hypoxemia - evaluation and management  Samual Beals Dennard Nip 04/13/2022, 9:34 PM

## 2022-04-13 NOTE — Progress Notes (Signed)
eLink Physician-Brief Progress Note Patient Name: [name] DOB: 1976-08-21 MRN: [id number]   Date of Service  04/13/2022  HPI/Events of Note  Hb drop from 7.9 to 6.5 TTP has been ruled out  eICU Interventions  Transfuse 1 U PRBC     Intervention Category Intermediate Interventions: Bleeding - evaluation and treatment with blood products  Tarsha Blando V. Harald Quevedo 04/13/2022, 12:02 AM

## 2022-04-13 NOTE — Progress Notes (Addendum)
eLink Physician-Brief Progress Note Patient Name: CAIDYN BLOSSOM DOB: Aug 29, 1976 MRN: [id number]   Date of Service  04/13/2022  HPI/Events of Note  ABG on [place] O2 = 7.48/33.4/56/25 and review of portable CXR reveals new patchy bilateral airspace opacity. This is new from exam 3 days ago. Differential considerations include pneumonia (including aspiration) or less likely pulmonary edema. Sat now = 97% and RR = on [place] O2. Given that he is 12+ liters positive on I/O since admission, will diurese gently. Creatinine = 2.8.  eICU Interventions  Plan: Decrease NaHCO3 IV infusion to 25 mL/hour. Lasix 40 mg IV X 1.      Intervention Category Major Interventions: Hypoxemia - evaluation and management  Jasier Calabretta Eugene 04/13/2022, 10:08 PM

## 2022-04-14 DIAGNOSIS — J9601 Acute respiratory failure with hypoxia: Secondary | ICD-10-CM

## 2022-04-14 LAB — BASIC METABOLIC PANEL
Anion gap: 14 (ref 5–15)
BUN: 43 mg/dL — ABNORMAL HIGH (ref 6–20)
CO2: 22 mmol/L (ref 22–32)
Calcium: 8.1 mg/dL — ABNORMAL LOW (ref 8.9–10.3)
Chloride: 102 mmol/L (ref 98–111)
Creatinine, Ser: 2.52 mg/dL — ABNORMAL HIGH (ref 0.44–1.00)
GFR, Estimated: 23 mL/min — ABNORMAL LOW (ref >60)
Glucose, Bld: 133 mg/dL — ABNORMAL HIGH (ref 70–99)
Potassium: 3.5 mmol/L (ref 3.5–5.1)
Sodium: 138 mmol/L (ref 135–145)

## 2022-04-14 LAB — CBC
HCT: 24 % — ABNORMAL LOW (ref 36.0–46.0)
Hemoglobin: 8.8 g/dL — ABNORMAL LOW (ref 12.0–15.0)
MCH: 39.8 pg — ABNORMAL HIGH (ref 26.0–34.0)
MCHC: 36.7 g/dL — ABNORMAL HIGH (ref 30.0–36.0)
MCV: 108.6 fL — ABNORMAL HIGH (ref 80.0–100.0)
Platelets: 74 10*3/uL — ABNORMAL LOW (ref 150–400)
RBC: 2.21 MIL/uL — ABNORMAL LOW (ref 3.87–5.11)
WBC: 16.8 10*3/uL — ABNORMAL HIGH (ref 4.0–10.5)
nRBC: 0.3 % — ABNORMAL HIGH (ref 0.0–0.2)

## 2022-04-14 LAB — BPAM RBC
Blood Product Expiration Date: 202307312359
Blood Product Expiration Date: 202308072359
Blood Product Expiration Date: 202308082359
ISSUE DATE / TIME: 202307171800
ISSUE DATE / TIME: 202307172004
ISSUE DATE / TIME: 202307200014
Unit Type and Rh: 7300
Unit Type and Rh: 7300
Unit Type and Rh: 7300

## 2022-04-14 LAB — HEPATIC FUNCTION PANEL
ALT: 56 U/L — ABNORMAL HIGH (ref 0–44)
AST: 124 U/L — ABNORMAL HIGH (ref 15–41)
Albumin: 2.9 g/dL — ABNORMAL LOW (ref 3.5–5.0)
Alkaline Phosphatase: 91 U/L (ref 38–126)
Bilirubin, Direct: 6.1 mg/dL — ABNORMAL HIGH (ref 0.0–0.2)
Indirect Bilirubin: 6.3 mg/dL — ABNORMAL HIGH (ref 0.3–0.9)
Total Bilirubin: 12.4 mg/dL — ABNORMAL HIGH (ref 0.3–1.2)
Total Protein: 7.5 g/dL (ref 6.5–8.1)

## 2022-04-14 LAB — TYPE AND SCREEN
ABO/RH(D): B POS
Antibody Screen: NEGATIVE
Unit division: 0
Unit division: 0
Unit division: 0

## 2022-04-14 LAB — GLUCOSE, CAPILLARY
Glucose-Capillary: 105 mg/dL — ABNORMAL HIGH (ref 70–99)
Glucose-Capillary: 131 mg/dL — ABNORMAL HIGH (ref 70–99)
Glucose-Capillary: 132 mg/dL — ABNORMAL HIGH (ref 70–99)
Glucose-Capillary: 198 mg/dL — ABNORMAL HIGH (ref 70–99)

## 2022-04-14 LAB — LACTATE DEHYDROGENASE: LDH: 189 U/L (ref 98–192)

## 2022-04-14 LAB — RETICULOCYTES
Immature Retic Fract: 13.6 % (ref 2.3–15.9)
RBC.: 2.25 MIL/uL — ABNORMAL LOW (ref 3.87–5.11)
Retic Count, Absolute: 92.5 10*3/uL (ref 19.0–186.0)
Retic Ct Pct: 4.1 % — ABNORMAL HIGH (ref 0.4–3.1)

## 2022-04-14 LAB — PATHOLOGIST SMEAR REVIEW

## 2022-04-14 LAB — MAGNESIUM: Magnesium: 2.1 mg/dL (ref 1.7–2.4)

## 2022-04-14 MED ORDER — ALBUTEROL SULFATE (2.5 MG/3ML) 0.083% IN NEBU
2.5000 mg | INHALATION_SOLUTION | Freq: Four times a day (QID) | RESPIRATORY_TRACT | Status: DC | PRN
  Administered 2022-04-14 – 2022-04-15 (×2): 2.5 mg via RESPIRATORY_TRACT
  Filled 2022-04-14 (×3): qty 3

## 2022-04-14 MED ORDER — FUROSEMIDE 10 MG/ML IJ SOLN
40.0000 mg | Freq: Once | INTRAMUSCULAR | Status: AC
Start: 2022-04-14 — End: 2022-04-14
  Administered 2022-04-14: 40 mg via INTRAVENOUS
  Filled 2022-04-14: qty 4

## 2022-04-14 MED ORDER — GERHARDT'S BUTT CREAM
TOPICAL_CREAM | Freq: Two times a day (BID) | CUTANEOUS | Status: DC
  Administered 2022-04-14: 1 via TOPICAL
  Filled 2022-04-14: qty 1

## 2022-04-14 MED ORDER — POTASSIUM CHLORIDE 20 MEQ PO PACK
40.0000 meq | PACK | Freq: Once | ORAL | Status: AC
Start: 2022-04-14 — End: 2022-04-14
  Administered 2022-04-14: 40 meq via ORAL
  Filled 2022-04-14: qty 2

## 2022-04-14 MED ORDER — POTASSIUM CHLORIDE 20 MEQ PO PACK
40.0000 meq | PACK | Freq: Once | ORAL | Status: AC
  Administered 2022-04-14: 40 meq via ORAL
  Filled 2022-04-14: qty 2

## 2022-04-14 MED ORDER — EPOETIN ALFA 40000 UNIT/ML IJ SOLN
40000.0000 [IU] | Freq: Once | INTRAMUSCULAR | Status: AC
  Administered 2022-04-14: 40000 [IU] via SUBCUTANEOUS
  Filled 2022-04-14: qty 1

## 2022-04-14 MED ORDER — GUAIFENESIN-DM 100-10 MG/5ML PO SYRP
10.0000 mL | ORAL_SOLUTION | ORAL | Status: DC | PRN
  Administered 2022-04-14 – 2022-04-17 (×11): 10 mL via ORAL
  Filled 2022-04-14 (×11): qty 10

## 2022-04-14 MED ORDER — POTASSIUM CHLORIDE CRYS ER 20 MEQ PO TBCR: 30.0000 meq | EXTENDED_RELEASE_TABLET | Freq: Once | ORAL | Status: DC

## 2022-04-14 MED ORDER — SODIUM CHLORIDE 0.9 % IV SOLN
1.5000 g | Freq: Three times a day (TID) | INTRAVENOUS | Status: DC
  Administered 2022-04-14 – 2022-04-16 (×6): 1.5 g via INTRAVENOUS
  Filled 2022-04-14 (×8): qty 4

## 2022-04-14 MED ORDER — CALCIUM GLUCONATE-NACL 1-0.675 GM/50ML-% IV SOLN
1.0000 g | Freq: Once | INTRAVENOUS | Status: AC
Start: 2022-04-14 — End: 2022-04-14
  Administered 2022-04-14: 1000 mg via INTRAVENOUS
  Filled 2022-04-14: qty 50

## 2022-04-14 NOTE — Progress Notes (Addendum)
Ms. Durnell is doing a little bit better.  She is now off pressors.  She still having some diarrhea.  She apparently tested negative for the norovirus.  Again, I believe that everything is hinges on her liver improving.  Today, her bilirubin is 12.4.  This is a little bit better than yesterday.  She has a relatively stable CBC.  Her white cell count 16.8.  Hemoglobin 8.8.  Platelet count 74,000.  Her reticulocyte count when corrected is about 2.5%.  Her LDH is 189.  Her BUN and creatinine are holding steady.  Today BUN is 43 creatinine 2.52.  Her ADAMTS-13 was 48%.  As such, I do not believe there is any indication that she has TTP.  Again, I have to believe that her liver is going to determine her prognosis.  I think if her liver improves, she will improve.  All cultures have been negative with respect to her blood.  She we concerning give her a dose sounds a bit congested.  She apparently had a chest x-ray today.  This showed patchy bilateral infiltrates.  I do not know if this is pneumonia of this might be pulmonary edema.  She is on nasal cannula oxygen right now.  Her vital signs all look good.  Her temperature is 98.2.  Pulse 88.  Blood pressure 100/69.  Oxygen saturation on 5 L is 96%.  Her lungs do sound a little congested.  I had heard some wheezing bilaterally.  Cardiac exam regular rate and rhythm.  Abdomen is soft.  Bowel sounds are present.  There is no fluid wave.  There is no guarding or rebound tenderness.  Extremities show some swelling in her lower legs and feet.  Neurological exam is nonfocal.  Again, there is no evidence of a microangiopathic hemolytic anemia.  I suspect that her hematologic issues are somewhat related to her liver.  She has renal insufficiency.  Her erythropoietin level is only 27.  We can certainly give her a dose of ESA.  I do still think that given her renal insufficiency and the low erythropoietin level that she will not mount an erythropoietic  response.  I do not see any reason why we cannot give her ESA right now.  I know that she is getting incredible care from all the staff down in the ICU.  I appreciate everybody's help.     Christin Bach, MD  [place] 14:14

## 2022-04-14 NOTE — Evaluation (Addendum)
Clinical/Bedside Swallow Evaluation Patient Details  Name: [name] MRN: [id number] Date of Birth: 19-Nov-1975  Today's Date: 04/14/2022 Time: SLP Start Time (ACUTE ONLY): 1401 SLP Stop Time (ACUTE ONLY): 1427 SLP Time Calculation (min) (ACUTE ONLY): 26 min  Past Medical History:  Past Medical History:  Diagnosis Date   Acid reflux    Anemia    Ulcer    Past Surgical History:  Past Surgical History:  Procedure Laterality Date   ABDOMINAL SURGERY     abdominoplasty   BREAST BIOPSY Right 2013   CHOLECYSTECTOMY N/A 11/23/2019   Procedure: LAPAROSCOPIC CHOLECYSTECTOMY WITH INTRAOPERATIVE CHOLANGIOGRAM;  Surgeon: Sheliah Hatch De Blanch, MD;  Location: [facility] OR;  Service: General;  Laterality: N/A;   COSMETIC SURGERY     tummy tuck   HERNIA REPAIR     ROUX-EN-Y PROCEDURE     TUMOR REMOVAL     WRIST GANGLION EXCISION     HPI:  46 year old female current every day smoker with past medical history of Acid reflux, Anemia, Ulcer, HTN who presents to the ED 04/10/2022 with fatigue and dizziness.  She is nontoxic-appearing.  Initial vital signs are notable for blood pressure of 90/43.  Patient reports a history of hypertension but has not been taking her medications regularly due to insurance issues; 04/13/22 CXR indicated New patchy bilateral airspace opacity. This is new from exam 3 days  ago. Differential considerations include pneumonia (including  aspiration) or less likely pulmonary edema; BSE generated.    Assessment / Plan / Recommendation  Clinical Impression  Pt seen for clinical swallowing evaluation with primary esophageal dysphagia noted characterized by delayed cough, regurgitation and gagging intermittently during meals reported (but not seen during evaluation) by pt.  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  Pt denotes "I have coughed a lot in  last two days" with CXR on 04/13/22 indicating new patchy bilateral airspace opacity. This is new from exam 3 days  ago. Differential considerations include pneumonia (including  aspiration).  Pt reported vomiting intermittently after meals and "I get tired chewing sometimes."  OME normal with vocal quality noted to be hypophonic with words/short phrases stated prior to next inhalation d/t dyspnea.  Pt currently on 5L oxygen.  Pt removed peeling from grapes during consumption during this assessment, but was able to swallow small, guided amounts of thin, puree and solids with delayed cough noted post-swallow.  Discussed respiratory/swallowing reciprocity/esophageal precautions/strategies during PO consumption to eliminate or reduce aspiration risk.  Recommend continuing Regular/thin liquid diet with posted swallowing precautions left in room and intermittent supervision to enforce precautions.  ST will f/u for education re: swallowing safety/diet tolerance while in acute setting.  Pt may benefit from esophageal assessment while in acute setting when able medically or f/u as an outpatient if symptoms persist.  Thank you for this consult. SLP Visit Diagnosis: Dysphagia, pharyngoesophageal phase (R13.14)    Aspiration Risk  Mild aspiration risk    Diet Recommendation   Regular/thin liquids  Medication Administration: Whole meds with liquid (1-2 per sip)    Other  Recommendations Recommended Consults: Consider esophageal assessment Oral Care Recommendations: Oral care BID;Patient independent with oral care    Recommendations for follow up therapy are one component of a multi-disciplinary discharge planning process, led by the attending physician.  Recommendations may be updated based on patient status, additional functional criteria and insurance authorization.  Follow up Recommendations Follow physician's recommendations  for discharge plan and follow up therapies      Assistance Recommended at  Discharge Intermittent Supervision/Assistance  Functional Status Assessment Patient has had a recent decline in their functional status and demonstrates the ability to make significant improvements in function in a reasonable and predictable amount of time.  Frequency and Duration min 2x/week  1 week       Prognosis Prognosis for Safe Diet Advancement: Good      Swallow Study   General Date of Onset: 04/10/22 HPI: 46 year old female current every day smoker with past medical history of Acid reflux, Anemia, Ulcer, HTN who presents to the ED 04/10/2022 with fatigue and dizziness.  She is nontoxic-appearing.  Initial vital signs are notable for blood pressure of 90/43.  Patient reports a history of hypertension but has not been taking her medications regularly due to insurance issues; 04/13/22 CXR indicated New patchy bilateral airspace opacity. This is new from exam 3 days  ago. Differential considerations include pneumonia (including  aspiration) or less likely pulmonary edema; BSE generated. Type of Study: Bedside Swallow Evaluation Previous Swallow Assessment: n/a Diet Prior to this Study: Regular;Thin liquids Temperature Spikes Noted: No Respiratory Status: Nasal cannula;Other (comment) (5L) History of Recent Intubation: No Behavior/Cognition: Alert;Cooperative;Pleasant mood Oral Cavity Assessment: Within Functional Limits Oral Care Completed by SLP: No Oral Cavity - Dentition: Adequate natural dentition Vision: Functional for self-feeding Self-Feeding Abilities: Able to feed self Patient Positioning: Upright in bed Baseline Vocal Quality: Hoarse;Low vocal intensity Volitional Cough: Strong Volitional Swallow: Able to elicit    Oral/Motor/Sensory Function Overall Oral Motor/Sensory Function: Within functional limits   Ice Chips Ice chips: Not tested   Thin Liquid Thin Liquid: Impaired Presentation: Cup;Straw Pharyngeal  Phase Impairments: Cough - Delayed    Nectar Thick Nectar  Thick Liquid: Not tested   Honey Thick Honey Thick Liquid: Not tested   Puree Puree: Impaired Presentation: Self Fed Pharyngeal Phase Impairments: Cough - Delayed   Solid     Solid: Impaired Presentation: Self Fed Pharyngeal Phase Impairments: Cough - Delayed      Tressie Stalker, M.S., CCC-SLP 04/14/2022,2:33 PM

## 2022-04-14 NOTE — Progress Notes (Addendum)
Pt. reports worsening shortness of breath that started today. Pt breathing is labored and spO2 is 88% room air. This nurse placed pt. on 2 L nasal cannula and notified Elink. Will wait for orders and continue to monitor.

## 2022-04-14 NOTE — Progress Notes (Signed)
Pharmacy Antibiotic Note  [name] is a 46 y.o. female admitted on 04/09/2022 with pneumonia.  Pharmacy has been consulted for Unasyn dosing.  ARF - CrCl currently 34 ml/min  Plan: Start Unasyn 1.5 gm IV q8hr Monitor renal funtion  Height: 5\' 6"  (167.6 cm) Weight: 99.1 kg (218 lb 7.6 oz) IBW/kg (Calculated) : 59.3  Temp (24hrs), Avg:97.9 F (36.6 C), Min:97.6 F (36.4 C), Max:98.2 F (36.8 C)  Recent Labs  Lab 04/10/22 1551 04/11/22 0205 04/12/22 1150 04/12/22 2016 04/13/22 0425 04/13/22 1152 04/13/22 2106 04/14/22 0400  WBC 7.7   < >  --  11.5* 10.5 12.1* 13.6* 16.8*  CREATININE  --    < >  --  3.09* 2.99* 2.80* 2.46* 2.52*  LATICACIDVEN 1.0  --  2.9*  --   --   --   --   --    < > = values in this interval not displayed.    Estimated Creatinine Clearance: 33.8 mL/min (A) (by C-G formula based on SCr of 2.52 mg/dL (H)).    Allergies  Allergen Reactions   Tape Rash    Clear tape    Antimicrobials this admission: Unasyn  7/21 >>  Thank you for allowing pharmacy to be a part of this patient's care.  8/21, PharmD, [facility] Clinical Pharmacist Please see AMION for all Pharmacists' Contact Phone Numbers 04/14/2022, 10:29 AM

## 2022-04-14 NOTE — Progress Notes (Signed)
eLink Physician-Brief Progress Note Patient Name: [name] DOB: 1975-11-28 MRN: [id number]   Date of Service  04/14/2022  HPI/Events of Note  Hypokalemia - K+ = 3.5 and Creatinine = 2.52.   eICU Interventions  Will replace K+.     Intervention Category Major Interventions: Electrolyte abnormality - evaluation and management  Natoya Viscomi Eugene 04/14/2022, 6:05 AM

## 2022-04-14 NOTE — Progress Notes (Signed)
[facility] NEPHROLOGY PROGRESS NOTE  Assessment/ Plan:  AKI, improving Hepatorenal syndrome Initially concern for TTP vs complement mediated TMA, however, PLASMIC score is low risk and patient has been evaluated by hematology. AKI is likely secondary to hepatorenal syndrome and hypotension. Cr peaked at 6.5 and improved to 2.5 today. Still no indication for renal replacement therapy at this point. Can d/c HD catheter especially if not being used for IV access - Continue fluids and pressor support to maintain MAP >65 - Avoid nephrotoxic medications including NSAIDs and iodinated contrast, unless absolutely necessary.  Preferred narcotic agents for pain control are hydromorphone, fentanyl, and methadone. Morphine should not be used. Avoid Baclofen and avoid oral sodium phosphate and magnesium citrate based laxatives / bowel preps. Continue strict Input and Output monitoring. Will monitor the patient closely with you and intervene or adjust therapy as indicated by changes in clinical status/labs   Hypotension Hypovolemic shock Patient has history of hypertension, although she is currently requiring pressor support at this time. Patient is on both levo  AHRF -secondary to hypervolemia vs aspiration PNA -abx per primary service, fluids stopped -agree with spot diuresis  Hypokalemia -improved - Replete as needed  Macrocytic anemia Hb 8.8 this morning, patient received 1u PRBC overnight. Likely secondary to marrow toxicity from alcohol use.   Thrombocytopenia Platelets stable 74 Hematology service is following.  Norovirus Patient continues to have profuse diarrhea. On pressor and fluids as above. Supportive care per primary service.   Transaminitis Continued workup and management per primary.   Anion gap metabolic acidosis Lactic acidosis -bicarb improved - stop bicarb gtt for concerns for hypervolemia    Subjective:    Has been having worsening SOB, CXR  performed and possible multifocal pna vs pulm edema. Bicarb gtt rate lowered to 25cc/hr, received lasix x 1 dose.  Objective Vital signs in last 24 hours: Vitals:   04/14/22 1000 04/14/22 1100 04/14/22 1112 04/14/22 1225  BP: 119/73 (!) 114/59  111/66  Pulse: (!) 111 (!) 106  (!) 101  Resp: (!) 26 (!) 41  (!) 25  Temp:   98.6 F (37 C)   TempSrc:   Oral   SpO2: 92% 93%  98%  Weight:      Height:       Weight change:   Intake/Output Summary (Last 24 hours) at 04/14/2022 1250 Last data filed at 04/14/2022 1100 Gross per 24 hour  Intake 1688.03 ml  Output 727 ml  Net 961.03 ml    Labs: Basic Metabolic Panel: Recent Labs  Lab 04/11/22 0205 04/11/22 1113 04/13/22 1152 04/13/22 2106 04/13/22 2154 04/14/22 0400  NA 134*   < > 135 136 139 138  K 2.4*   < > 2.6* 3.2* 3.8 3.5  CL 105   < > 102 99  --  102  CO2 17*   < > 21* 22  --  22  GLUCOSE 135*   < > 186* 130*  --  133*  BUN 66*   < > 44* 44*  --  43*  CREATININE 5.72*   < > 2.80* 2.46*  --  2.52*  CALCIUM 7.5*   < > 7.9* 7.6*  --  8.1*  PHOS 2.4*  --   --   --   --   --    < > = values in this interval not displayed.   Liver Function Tests: Recent Labs  Lab 04/12/22 0327 04/13/22 0425 04/14/22 0400  AST 144* 111* 124*  ALT  52* 48* 56*  ALKPHOS 110 89 91  BILITOT 12.2* 13.2* 12.4*  PROT 6.8 7.6 7.5  ALBUMIN 2.1* 3.3* 2.9*   Recent Labs  Lab 04/10/22 0234 04/11/22 0205  LIPASE 267* 103*   No results for input(s): "AMMONIA" in the last 168 hours. CBC: Recent Labs  Lab 04/11/22 1113 04/11/22 2115 04/12/22 0327 04/12/22 1015 04/12/22 2016 04/12/22 2252 04/13/22 0425 04/13/22 1152 04/13/22 2106 04/13/22 2154 04/14/22 0400  WBC 12.3* 12.8* 12.6*   < > 11.5*  --  10.5 12.1* 13.6*  --  16.8*  NEUTROABS 9.4* 10.1* 10.2*  --   --   --   --   --   --   --   --   HGB 7.7* 7.9* 7.7*   < > 7.0*   < > 7.4* 7.7* 8.1* 8.8* 8.8*  HCT 21.7* 22.4* 21.2*   < > 19.2*   < > 21.1* 21.2* 22.1* 26.0* 24.0*  MCV  110.7* 110.9* 111.6*   < > 111.6*  --  110.5* 108.2* 107.8*  --  108.6*  PLT 93* 91* 88*   < > 69*  --  59* 68* 73*  --  74*   < > = values in this interval not displayed.   Cardiac Enzymes: Recent Labs  Lab 04/09/22 2320  CKTOTAL 23*   CBG: Recent Labs  Lab 04/13/22 1208 04/13/22 1649 04/13/22 2052 04/14/22 0713 04/14/22 1110  GLUCAP 167* 179* 142* 105* 131*    Iron Studies:  No results for input(s): "IRON", "TIBC", "TRANSFERRIN", "FERRITIN" in the last 72 hours.  Studies/Results: DG CHEST PORT 1 VIEW  Result Date: 04/13/2022 CLINICAL DATA:  Shortness of breath and cough. EXAM: PORTABLE CHEST 1 VIEW COMPARISON:  Radiograph 04/10/2022 FINDINGS: Right internal jugular central venous catheter tip overlies the mid SVC. New areas of patchy bilateral airspace opacity, left perihilar, right upper lobe abutting the fissure and both lung bases. Stable heart size, upper normal, likely accentuated by technique. No pneumothorax or large pleural effusion. IMPRESSION: New patchy bilateral airspace opacity. This is new from exam 3 days ago. Differential considerations include pneumonia (including aspiration) or less likely pulmonary edema. Electronically Signed   By: Narda Rutherford M.D.   On: 04/13/2022 21:50    Medications: Infusions:  ampicillin-sulbactam (UNASYN) IV 1.5 g (04/14/22 1221)   promethazine (PHENERGAN) injection (IM or IVPB) Stopped (04/14/22 0108)    Scheduled Medications:  Chlorhexidine Gluconate Cloth  6 each Topical Daily   famotidine  20 mg Oral Daily   folic acid  2 mg Oral Daily   Gerhardt's butt cream   Topical BID   insulin aspart  0-15 Units Subcutaneous TID WC   insulin aspart  0-5 Units Subcutaneous QHS   prednisoLONE  40 mg Oral Daily   sodium chloride flush  10-40 mL Intracatheter Q12H    have reviewed scheduled and prn medications.  Physical Exam: General: NAD Heart: Regular rate, rhythm. No murmurs. Lungs: coarse breath sounds/rales b/l. Abdomen:  Soft, non-tender, non-distended. Extremities: Trace pedal edema.  Neuro: Awake, alert, conversing appropriately. No focal deficits.

## 2022-04-14 NOTE — Consult Note (Addendum)
WOC Nurse Consult Note: Reason for Consult: Consult requested for buttocks.  Pt has been having frequent diarrhea and there is a partial thickness fissure realted to moisture to the inner buttocks/gluteal cleft.  Red and moist, painful to touch, 3X.1X.1cm  ICD-10 CM Codes for Irritant Dermatitis L24A2 - Due to fecal, urinary or dual incontinence  Dressing procedure/placement/frequency: Topical treatment orders provided for bedside nurses to perform as follows to protect and repel moisture: Apply Gerhardts cream to inner buttocks BID and with teach urning and cleaning session. Please re-consult if further assistance is needed.  Thank-you,  Cammie Mcgee MSN, RN, CWOCN, [place], CNS [phone]

## 2022-04-14 NOTE — Progress Notes (Signed)
NAME:  [name], MRN:  [id number], DOB:  08-27-1976, LOS: 4 ADMISSION DATE:  04/09/2022, CONSULTATION DATE:  04/10/2022 REFERRING MD:  Horton, CHIEF COMPLAINT:  Acute renal failure, anemia, thrombocytopenia   History of Present Illness:  [name] is a 46 y.o. 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 She was hypotensive to 90/43 on exam, EKG did not show arrhythmia or sign of ischemia.   Initial labs remarkable for K 1.9, creatinine 6.38, Hgb 7.0>6.0, platelets 118>82, BUN/Cr 72/6.54. No signs of active bleeding on exam. CT abdomen obtained showed severe fatty liver, artifact versus mild acute pancreatitis but no bowel obstruction.  She received total of 2L IVF.    She denies any new meds or significant NSAID use.  She denies significant dehydration. atient reports history of renal cell carcinoma in her family. This is concerning for her.  C. She has history of 4 shots of alcohol per day. The kidneys, visualized ureters, and the urinary bladder appear unremarkable.   She is unable to provide a urine sample.  She does state that she is urinating at home.  Second liter of fluids ordered in the ED.  Nephrology has been consulted.Hematology has been consulted. ( [place])   At time of admission, labs remarkable for Na 133, K 2.1, Cl 99, CO2 19, BUN 72, creatinine 6.54, calcium 8.7, anion gap 15, LDH 217, albumin 3.0, ak phos 105, lipase 267, AST 130, ALT 39, direct bili 5.5, total bili 10.2, CK 23, WBC 9.6, Hgb 6.0, platelets 82, vitamin B 12 1136, iron 181, saturation ratios 48, serum folate 3.2, ferritin 73, methylmalonic acid 193, GI Panel + for Norovirus but asymptomatic.   PCCM have been asked to admit patient to ICU and manage care.  Pertinent  Medical History  Tobacco use disorder GERD Anemia Bariatric surgery HTN EtOH  abuse  Significant Hospital Events: Including procedures, antibiotic start and stop dates in addition to other pertinent events   07/17 Admitted to ICU 07/17 Trialysis catheter placed  Interim History / Subjective:  Patient doesn't feel well today, is now requiring supplemental O2 which she isn't happy with. She does note improvement in her frequent diarrhea. She feels like her swelling is getting better.  Objective   Blood pressure 118/73, pulse 85, temperature 97.8 F (36.6 C), temperature source Oral, resp. rate 19, height 5\' 6"  (1.676 m), weight 99.1 kg, last menstrual period 03/10/2022, SpO2 96 %.        Intake/Output Summary (Last 24 hours) at 04/14/2022 0835 Last data filed at 04/14/2022 0600 Gross per 24 hour  Intake 2094.15 ml  Output 730 ml  Net 1364.15 ml    Filed Weights   04/10/22 1957 04/11/22 0400 04/13/22 0500  Weight: 93 kg 93 kg 99.1 kg    Examination: Constitutional:No acute distress. Resting comfortably. Eyes:Scleral icterus present. Cardio:Regular rate and rhythm.  Pulm:Some intermittent wheezing noted but overall no abnormal breath sounds, no crackles. Abdomen:Soft, non-distended. +hepatomegaly.  MSK:0-1+ pitting edema to distal LE. Skin:Warm and dry. No definite rashes or lesions. Skin is jaundiced. Neuro:Alert, oriented, no focal deficit noted. Psych:Normal mood and affect.  Resolved Hospital Problem list   Hyponatremia Elevated LDH Hypovolemic hock Metabolic acidosis Elevated LDH  Assessment & Plan:  Acute hypoxic respiratory failure Patient had increased dyspnea and work of breathing overnight 07/20 prompting CXR which showed new patchy bilateral airspace opacities.  She was given IV Lasix 40 mg x1. She is on 5L [place] with SpO2 in mid-90s on my exam and shows mildly increased work of breathing though on auscultation I do not appreciate abnormal lung sounds. She has received large-volume fluid resuscitation this admission so, combined with  improvement of distal LE edema with Lasix dose, I do suspect that volume overload is a major factor in her new oxygen requirement.  -IV Lasix 40 mg IV x1. Will assess need for scheduled Lasix based on response to additional IV dose. -Supplemental oxygen, wean as tolerated with Sp02 >94%  Acute on chronic anemia without hemolysis Thrombocytopenia  Possible marrow toxicity from EtOH abuse Blood counts are stable at this time, no signs of active bleeding at this time. She has a new supplemental oxygen requirement overnight that is most likely due to volume overload, but certainly not helped by her anemia. Pathologist smear review showed anisocytosis which is consistent with her anemia, liver failure, and kidney injury. -Hematology consulted, appreciate their recommendations; hopeful for hematologic improvement with improvement of liver function -Trend CBC, transfuse if Hgb <7   Acute gastroenteritis 2/2 Norovirus Hypokalemia Now off pressor support and maintaining appropriate BP. She notes decreased frequency of diarrhea but does still have nausea. She has an increased leukocytosis to 16.8, however is on prednisolone so is not unexpected. Blood culture shows no growth at 4 days. Though repeat GI panel was negative for norovirus, her clinical course leads me to suspect that she is in fact still acutely infected and needs continued enteric precautions. K has been repleted as needed, stable this morning at 3.5. -Enteric Precautions  -Immodium PRN   Acute kidney injury  Hepatorenal syndome Renal function stable at this time. She did receive IV Lasix overnight for fluid overload so I suspect the lack of continued downtrend in serum Cr is due in part to this. She did have improvement with bicarb infusion over the last several days, which combined with hypokalemia, increases suspicion for RTA on admission. Certainly her hypovolemic shock and acute liver failure contributed to her acute kidney injury as  well, both of which are either improved or stable. -Nephrology consulted, appreciate their recommendations -Trend BMP, replete electrolytes as indicated -Strict I&O  Acute alcoholic hepatitis Fatty Liver Disease Hepatic enzymes largely unchanged from yesterday. Today is day 3 of prednisolone therapy for acute alcoholic hepatitis. I am hopeful for continued improvement of her hepatic function and resulting improvement of overall clinical picture as a result. -Prednisolone 40 mg daily -Check Lille score 07/26 and reassess need for 28  day course versus discontinuation of prednisolone -Trend LFTs -Trend coag panel   Tobacco Abuse -Nicotine patch PRN  -Smoking cessation counseling recommended on discharge  Best Practice (right click and "Reselect all SmartList Selections" daily)   Diet/type: Regular consistency (see orders) DVT prophylaxis: SCD GI prophylaxis: H2B Lines: Dialysis Catheter Foley:  N/A Code Status:  full code Last date of multidisciplinary goals of care discussion [07/17]  Labs   CBC: Recent Labs  Lab 04/10/22 1551 04/11/22 0205 04/11/22 1113 04/11/22 2115 04/12/22 0327 04/12/22 1015 04/12/22 2016 04/12/22 2252 04/13/22 0425 04/13/22 1152 04/13/22 2106 04/13/22 2154 04/14/22 0400  WBC 7.7 11.2* 12.3* 12.8* 12.6*   < > 11.5*  --  10.5 12.1* 13.6*  --  16.8*  NEUTROABS 5.5 8.4* 9.4* 10.1* 10.2*  --   --   --   --   --   --   --   --   HGB 5.1*  7.5* 7.7* 7.9* 7.7*   < > 7.0*   < > 7.4* 7.7* 8.1* 8.8* 8.8*  HCT 14.3* 20.7* 21.7* 22.4* 21.2*   < > 19.2*   < > 21.1* 21.2* 22.1* 26.0* 24.0*  MCV 128.8* 108.9* 110.7* 110.9* 111.6*   < > 111.6*  --  110.5* 108.2* 107.8*  --  108.6*  PLT 77* 91* 93* 91* 88*   < > 69*  --  59* 68* 73*  --  74*   < > = values in this interval not displayed.     Basic Metabolic Panel: Recent Labs  Lab 04/09/22 2242 04/10/22 0905 04/11/22 0205 04/11/22 1113 04/12/22 2016 04/13/22 0425 04/13/22 1152 04/13/22 2106  04/13/22 2154 04/14/22 0400  NA 134*   < > 134*   < > 135 135 135 136 139 138  K 1.9*   < > 2.4*   < > 3.6 3.1* 2.6* 3.2* 3.8 3.5  CL 95*   < > 105   < > 105 104 102 99  --  102  CO2 22   < > 17*   < > 16* 18* 21* 22  --  22  GLUCOSE 124*   < > 135*   < > 253* 293* 186* 130*  --  133*  BUN 72*   < > 66*   < > 42* 43* 44* 44*  --  43*  CREATININE 6.38*   < > 5.72*   < > 3.09* 2.99* 2.80* 2.46*  --  2.52*  CALCIUM 9.6   < > 7.5*   < > 7.6* 7.4* 7.9* 7.6*  --  8.1*  MG 2.5*  --  2.7*  --   --   --  2.2  --   --  2.1  PHOS  --   --  2.4*  --   --   --   --   --   --   --    < > = values in this interval not displayed.    GFR: Estimated Creatinine Clearance: 33.8 mL/min (A) (by C-G formula based on SCr of 2.52 mg/dL (H)). Recent Labs  Lab 04/10/22 1551 04/11/22 0205 04/12/22 1150 04/12/22 2016 04/13/22 0425 04/13/22 1152 04/13/22 2106 04/14/22 0400  WBC 7.7   < >  --    < > 10.5 12.1* 13.6* 16.8*  LATICACIDVEN 1.0  --  2.9*  --   --   --   --   --    < > = values in this interval not displayed.     Liver Function Tests: Recent Labs  Lab 04/10/22 0234 04/11/22 0205 04/12/22 0327 04/13/22 0425 04/14/22 0400  AST 130* 136* 144* 111* 124*  ALT 39 46* 52* 48* 56*  ALKPHOS 105 102 110 89 91  BILITOT 10.2* 12.0* 12.2* 13.2* 12.4*  PROT 7.4 6.6 6.8 7.6 7.5  ALBUMIN 3.0* 2.1* 2.1* 3.3* 2.9*    Recent Labs  Lab 04/10/22 0234 04/11/22 0205  LIPASE 267* 103*    No results for input(s): "AMMONIA" in the last 168 hours.  ABG    Component Value Date/Time   PHART 7.481 (H) 04/13/2022 2154   PCO2ART 33.4 04/13/2022 2154   PO2ART 56 (L) 04/13/2022 2154   HCO3 25.0 04/13/2022 2154   TCO2 26 04/13/2022 2154   O2SAT 92 04/13/2022 2154     Coagulation Profile: Recent Labs  Lab 04/10/22 1551 04/11/22 0205  INR 1.8* 1.7*     Cardiac Enzymes: Recent  Labs  Lab 04/09/22 2320  CKTOTAL 23*     HbA1C: Hgb A1c MFr Bld  Date/Time Value Ref Range Status  04/13/2022  04:25 AM 3.8 (L) 4.8 - 5.6 % Final    Comment:    (NOTE) Pre diabetes:          5.7%-6.4%  Diabetes:              >6.4%  Glycemic control for   <7.0% adults with diabetes     CBG: Recent Labs  Lab 04/12/22 1139 04/13/22 1208 04/13/22 1649 04/13/22 2052 04/14/22 0713  GLUCAP 151* 167* 179* 142* 105*     Review of Systems:   Negative unless otherwise stated.  Past Medical History:  She,  has a past medical history of Acid reflux, Anemia, and Ulcer.   Surgical History:   Past Surgical History:  Procedure Laterality Date   ABDOMINAL SURGERY     abdominoplasty   BREAST BIOPSY Right 2013   CHOLECYSTECTOMY N/A 11/23/2019   Procedure: LAPAROSCOPIC CHOLECYSTECTOMY WITH INTRAOPERATIVE CHOLANGIOGRAM;  Surgeon: Sheliah Hatch De Blanch, MD;  Location: [facility] OR;  Service: General;  Laterality: N/A;   COSMETIC SURGERY     tummy tuck   HERNIA REPAIR     ROUX-EN-Y PROCEDURE     TUMOR REMOVAL     WRIST GANGLION EXCISION       Social History:   reports that she has been smoking cigarettes. She has a 6.25 pack-year smoking history. She has never used smokeless tobacco. She reports that she does not currently use alcohol after a past usage of about 12.0 standard drinks of alcohol per week. She reports that she does not use drugs.   Family History:  Her family history includes Breast cancer in her maternal aunt; Cancer in her mother and paternal aunt; Lung cancer in her paternal uncle; Pancreatic cancer in her maternal aunt.   Allergies Allergies  Allergen Reactions   Tape Rash    Clear tape     Home Medications  Prior to Admission medications   Medication Sig Start Date End Date Taking? Authorizing Provider  busPIRone (BUSPAR) 15 MG tablet Take 15 mg by mouth 3 (three) times daily as needed (anxiety). 06/09/21  Yes [provider]  Cyanocobalamin (B-12 PO) Take 1 tablet by mouth daily.   Yes [provider]  sertraline (ZOLOFT) 100 MG tablet Take 100 mg by  mouth as needed. 06/15/21  Yes [provider]  folic acid (FOLVITE) 1 MG tablet Take 1 tablet (1 mg total) by mouth daily. Patient not taking: Reported on 04/11/2022 01/20/22   Georga Kaufmann T, PA-C  gabapentin (NEURONTIN) 300 MG capsule Take 1 capsule (300 mg total) by mouth 3 (three) times daily. Patient not taking: Reported on 04/11/2022 10/04/20   Eustace Moore, MD  valsartan (DIOVAN) 80 MG tablet Take 80 mg by mouth daily. Patient not taking: Reported on 04/11/2022 06/27/21   [provider]  pantoprazole (PROTONIX) 20 MG tablet Take 1 tablet (20 mg total) by mouth daily for 14 days. 08/05/18 10/04/20  Couture, Cortni S, PA-C     Critical care time: 45 minutes

## 2022-04-14 NOTE — Progress Notes (Signed)
eLink Physician-Brief Progress Note Patient Name: JALONDA ANTIGUA DOB: 11-Jun-1976 MRN: [id number]   Date of Service  04/14/2022  HPI/Events of Note  Hypokalemia  Hypocalcemia = K+ = 3.2, ionized Ca++ = 0.97 and Creatinine = 2.46.  eICU Interventions  Plan: Will replace K+ and Ca++.     Intervention Category Major Interventions: Electrolyte abnormality - evaluation and management  Keiffer Piper Eugene 04/14/2022, 12:16 AM

## 2022-04-14 NOTE — Progress Notes (Signed)
eLink Physician-Brief Progress Note Patient Name: [name] DOB: 11/26/1975 MRN: [id number]   Date of Service  04/14/2022  HPI/Events of Note  Multiple issues: 1. Request for cough medication and 2. Request to change KCl tablet to power.   eICU Interventions  Plan: Robitussin DM 10 mL Q 4 hours PRN cough. Will change KCl replacement to Klor-con powder.      Intervention Category Major Interventions: Other:  [name] 04/14/2022, 12:43 AM

## 2022-04-15 ENCOUNTER — Inpatient Hospital Stay (HOSPITAL_COMMUNITY): Payer: Self-pay

## 2022-04-15 LAB — BRAIN NATRIURETIC PEPTIDE: B Natriuretic Peptide: 236.6 pg/mL — ABNORMAL HIGH (ref 0.0–100.0)

## 2022-04-15 LAB — CULTURE, BLOOD (ROUTINE X 2)
Culture: NO GROWTH
Special Requests: ADEQUATE

## 2022-04-15 LAB — RETICULOCYTES
Immature Retic Fract: 10.1 % (ref 2.3–15.9)
RBC.: 2.09 MIL/uL — ABNORMAL LOW (ref 3.87–5.11)
Retic Count, Absolute: 77.3 10*3/uL (ref 19.0–186.0)
Retic Ct Pct: 3.7 % — ABNORMAL HIGH (ref 0.4–3.1)

## 2022-04-15 LAB — RENAL FUNCTION PANEL
Albumin: 2.3 g/dL — ABNORMAL LOW (ref 3.5–5.0)
Anion gap: 12 (ref 5–15)
BUN: 43 mg/dL — ABNORMAL HIGH (ref 6–20)
CO2: 22 mmol/L (ref 22–32)
Calcium: 7.1 mg/dL — ABNORMAL LOW (ref 8.9–10.3)
Chloride: 102 mmol/L (ref 98–111)
Creatinine, Ser: 2.25 mg/dL — ABNORMAL HIGH (ref 0.44–1.00)
GFR, Estimated: 27 mL/min — ABNORMAL LOW (ref >60)
Glucose, Bld: 148 mg/dL — ABNORMAL HIGH (ref 70–99)
Phosphorus: 2.7 mg/dL (ref 2.5–4.6)
Potassium: 2.8 mmol/L — ABNORMAL LOW (ref 3.5–5.1)
Sodium: 136 mmol/L (ref 135–145)

## 2022-04-15 LAB — GLUCOSE, CAPILLARY
Glucose-Capillary: 146 mg/dL — ABNORMAL HIGH (ref 70–99)
Glucose-Capillary: 148 mg/dL — ABNORMAL HIGH (ref 70–99)
Glucose-Capillary: 140 mg/dL — ABNORMAL HIGH (ref 70–99)
Glucose-Capillary: 156 mg/dL — ABNORMAL HIGH (ref 70–99)

## 2022-04-15 LAB — HEPATIC FUNCTION PANEL
ALT: 57 U/L — ABNORMAL HIGH (ref 0–44)
AST: 114 U/L — ABNORMAL HIGH (ref 15–41)
Albumin: 2.5 g/dL — ABNORMAL LOW (ref 3.5–5.0)
Alkaline Phosphatase: 88 U/L (ref 38–126)
Bilirubin, Direct: 5.6 mg/dL — ABNORMAL HIGH (ref 0.0–0.2)
Indirect Bilirubin: 5.5 mg/dL — ABNORMAL HIGH (ref 0.3–0.9)
Total Bilirubin: 11.1 mg/dL — ABNORMAL HIGH (ref 0.3–1.2)
Total Protein: 6.6 g/dL (ref 6.5–8.1)

## 2022-04-15 LAB — POCT I-STAT 7, (LYTES, BLD GAS, ICA,H+H)
Acid-Base Excess: 0 mmol/L (ref 0.0–2.0)
Bicarbonate: 24.3 mmol/L (ref 20.0–28.0)
Calcium, Ion: 1 mmol/L — ABNORMAL LOW (ref 1.15–1.40)
HCT: 29 % — ABNORMAL LOW (ref 36.0–46.0)
Hemoglobin: 9.9 g/dL — ABNORMAL LOW (ref 12.0–15.0)
O2 Saturation: 94 %
Patient temperature: 98
Potassium: 3.2 mmol/L — ABNORMAL LOW (ref 3.5–5.1)
Sodium: 143 mmol/L (ref 135–145)
TCO2: 25 mmol/L (ref 22–32)
pCO2 arterial: 34.8 mmHg (ref 32–48)
pH, Arterial: 7.451 — ABNORMAL HIGH (ref 7.35–7.45)
pO2, Arterial: 64 mmHg — ABNORMAL LOW (ref 83–108)

## 2022-04-15 LAB — LACTATE DEHYDROGENASE: LDH: 161 U/L (ref 98–192)

## 2022-04-15 MED ORDER — POTASSIUM CHLORIDE CRYS ER 20 MEQ PO TBCR: 40.0000 meq | EXTENDED_RELEASE_TABLET | Freq: Once | ORAL | Status: DC

## 2022-04-15 MED ORDER — CALCIUM GLUCONATE-NACL 2-0.675 GM/100ML-% IV SOLN
2.0000 g | Freq: Once | INTRAVENOUS | Status: AC
  Administered 2022-04-15: 2000 mg via INTRAVENOUS
  Filled 2022-04-15: qty 100

## 2022-04-15 MED ORDER — POTASSIUM CHLORIDE 10 MEQ/100ML IV SOLN
10.0000 meq | INTRAVENOUS | Status: AC
  Administered 2022-04-15 (×4): 10 meq via INTRAVENOUS
  Filled 2022-04-15 (×4): qty 100

## 2022-04-15 NOTE — Progress Notes (Signed)
eLink Physician-Brief Progress Note Patient Name: [name] DOB: 01-Feb-1976 MRN: [id number]   Date of Service  04/15/2022  HPI/Events of Note  Ca++ 1.00, ABG and CXR reviewed.  eICU Interventions  Calcium gluconate 2 gm iv x 1 ordered, will update the incoming PCCM attending regarding the likely need for intubation.        Migdalia Dk 04/15/2022, 6:49 AM

## 2022-04-15 NOTE — Progress Notes (Signed)
eLink Physician-Brief Progress Note Patient Name: KYLIAH DEANDA DOB: Nov 06, 1975 MRN: [id number]   Date of Service  04/15/2022  HPI/Events of Note  Respiratory distress with increased work of breathing, saturation 96 %.  eICU Interventions  Trial of BIPAP, stat portable CXR, BNP, ABG one hour after BIPAP is started.        Thomasene Lot Edwing Figley 04/15/2022, 4:26 AM

## 2022-04-15 NOTE — Plan of Care (Signed)
  Problem: Education: Goal: Knowledge of General Education information will improve Description: Including pain rating scale, medication(s)/side effects and non-pharmacologic comfort measures Outcome: Progressing   Problem: Health Behavior/Discharge Planning: Goal: Ability to manage health-related needs will improve Outcome: Progressing   Problem: Activity: Goal: Risk for activity intolerance will decrease Outcome: Progressing   Problem: Nutrition: Goal: Adequate nutrition will be maintained Outcome: Progressing   Problem: Coping: Goal: Level of anxiety will decrease Outcome: Progressing   Problem: Coping: Goal: Ability to adjust to condition or change in health will improve Outcome: Progressing   Problem: Nutritional: Goal: Maintenance of adequate nutrition will improve Outcome: Progressing Goal: Progress toward achieving an optimal weight will improve Outcome: Progressing

## 2022-04-15 NOTE — Progress Notes (Signed)
[facility] NEPHROLOGY PROGRESS NOTE  Assessment/ Plan:  AKI, improving Hepatorenal syndrome Initially concern for TTP vs complement mediated TMA, however, PLASMIC score is low risk and patient has been evaluated by hematology. AKI is likely secondary to hepatorenal syndrome and hypotension. Cr peaked at 6.5 and improved further to 2.25 today. Still no indication for renal replacement therapy at this point. Can D/C HD catheter especially if not being used for central IV access. Nothing else to add at this time from a nephrology perspective. Will sign off. Please call with any questions/concerns. -maintain MAP >65 to ensure adequate renal perfusion - Avoid nephrotoxic medications including NSAIDs and iodinated contrast, unless absolutely necessary.  Preferred narcotic agents for pain control are hydromorphone, fentanyl, and methadone. Morphine should not be used. Avoid Baclofen and avoid oral sodium phosphate and magnesium citrate based laxatives / bowel preps. Continue strict Input and Output monitoring. Will monitor the patient closely with you and intervene or adjust therapy as indicated by changes in clinical status/labs   Hypotension Hypovolemic shock Patient has history of hypertension, but hypotensive on presentation, required pressors and volume resuscitation. Patient is currently off pressors  AHRF -secondary to hypervolemia vs aspiration PNA. Repeat CXR today looks more like PNA -abx per primary service, fluids stopped -can do spot diuresis if needed  Hypokalemia - Replete as needed. Likely lower today given the fact she received lasix yesterday  Macrocytic anemia Hb 9.9 this morning, Likely secondary to marrow toxicity from alcohol use. Hematology followings  Thrombocytopenia Platelets stable 74 on 7/21. Hematology service is following.  Norovirus Patient continues to have profuse diarrhea. On pressor and fluids as above. Supportive care per primary service.    Transaminitis Continued workup and management per primary.   Anion gap metabolic acidosis Lactic acidosis -resolved; bicarb improved, now WNL. Off bicarb gtt  Subjective:   SOB again overnight. Required bipap briefly but did not tolerate  Objective Vital signs in last 24 hours: Vitals:   04/15/22 0604 04/15/22 0700 04/15/22 0756 04/15/22 0800  BP: 106/64 (!) 104/58  105/62  Pulse: (!) 105 (!) 101  (!) 106  Resp: (!) 38 (!) 33  (!) 24  Temp:   97.9 F (36.6 C)   TempSrc:   Oral   SpO2: 95% 98%  97%  Weight:      Height:       Weight change:   Intake/Output Summary (Last 24 hours) at 04/15/2022 0911 Last data filed at 04/15/2022 0600 Gross per 24 hour  Intake 330.35 ml  Output 204 ml  Net 126.35 ml    Labs: Basic Metabolic Panel: Recent Labs  Lab 04/11/22 0205 04/11/22 1113 04/13/22 2106 04/13/22 2154 04/14/22 0400 04/15/22 0542 04/15/22 0700  NA 134*   < > 136   < > 138 143 136  K 2.4*   < > 3.2*   < > 3.5 3.2* 2.8*  CL 105   < > 99  --  102  --  102  CO2 17*   < > 22  --  22  --  22  GLUCOSE 135*   < > 130*  --  133*  --  148*  BUN 66*   < > 44*  --  43*  --  43*  CREATININE 5.72*   < > 2.46*  --  2.52*  --  2.25*  CALCIUM 7.5*   < > 7.6*  --  8.1*  --  7.1*  PHOS 2.4*  --   --   --   --   --  2.7   < > = values in this interval not displayed.   Liver Function Tests: Recent Labs  Lab 04/13/22 0425 04/14/22 0400 04/15/22 0534 04/15/22 0700  AST 111* 124* 114*  --   ALT 48* 56* 57*  --   ALKPHOS 89 91 88  --   BILITOT 13.2* 12.4* 11.1*  --   PROT 7.6 7.5 6.6  --   ALBUMIN 3.3* 2.9* 2.5* 2.3*   Recent Labs  Lab 04/10/22 0234 04/11/22 0205  LIPASE 267* 103*   No results for input(s): "AMMONIA" in the last 168 hours. CBC: Recent Labs  Lab 04/11/22 1113 04/11/22 2115 04/12/22 0327 04/12/22 1015 04/12/22 2016 04/12/22 2252 04/13/22 0425 04/13/22 1152 04/13/22 2106 04/13/22 2154 04/14/22 0400 04/15/22 0542  WBC 12.3* 12.8* 12.6*    < > 11.5*  --  10.5 12.1* 13.6*  --  16.8*  --   NEUTROABS 9.4* 10.1* 10.2*  --   --   --   --   --   --   --   --   --   HGB 7.7* 7.9* 7.7*   < > 7.0*   < > 7.4* 7.7* 8.1* 8.8* 8.8* 9.9*  HCT 21.7* 22.4* 21.2*   < > 19.2*   < > 21.1* 21.2* 22.1* 26.0* 24.0* 29.0*  MCV 110.7* 110.9* 111.6*   < > 111.6*  --  110.5* 108.2* 107.8*  --  108.6*  --   PLT 93* 91* 88*   < > 69*  --  59* 68* 73*  --  74*  --    < > = values in this interval not displayed.   Cardiac Enzymes: Recent Labs  Lab 04/09/22 2320  CKTOTAL 23*   CBG: Recent Labs  Lab 04/14/22 0713 04/14/22 1110 04/14/22 1641 04/14/22 2119 04/15/22 0752  GLUCAP 105* 131* 132* 198* 146*    Iron Studies:  No results for input(s): "IRON", "TIBC", "TRANSFERRIN", "FERRITIN" in the last 72 hours.  Studies/Results: DG Chest Port 1 View  Result Date: 04/15/2022 CLINICAL DATA:  Respiratory disease EXAM: PORTABLE CHEST 1 VIEW COMPARISON:  Two days ago FINDINGS: Extensive airspace disease asymmetric to the left lung. Normal heart size and mediastinal contours. Right IJ line with tip at the SVC, no reported history of hemoptysis. No visible effusion or air leak IMPRESSION: Worsening airspace disease now asymmetric to the left, asymmetry favoring pneumonia. Electronically Signed   By: Tiburcio Pea M.D.   On: 04/15/2022 06:18   DG CHEST PORT 1 VIEW  Result Date: 04/13/2022 CLINICAL DATA:  Shortness of breath and cough. EXAM: PORTABLE CHEST 1 VIEW COMPARISON:  Radiograph 04/10/2022 FINDINGS: Right internal jugular central venous catheter tip overlies the mid SVC. New areas of patchy bilateral airspace opacity, left perihilar, right upper lobe abutting the fissure and both lung bases. Stable heart size, upper normal, likely accentuated by technique. No pneumothorax or large pleural effusion. IMPRESSION: New patchy bilateral airspace opacity. This is new from exam 3 days ago. Differential considerations include pneumonia (including aspiration) or  less likely pulmonary edema. Electronically Signed   By: Narda Rutherford M.D.   On: 04/13/2022 21:50    Medications: Infusions:  ampicillin-sulbactam (UNASYN) IV Stopped (04/15/22 0355)   calcium gluconate 2,000 mg (04/15/22 0817)   potassium chloride     promethazine (PHENERGAN) injection (IM or IVPB) Stopped (04/14/22 0108)    Scheduled Medications:  Chlorhexidine Gluconate Cloth  6 each Topical Daily   famotidine  20 mg Oral Daily  folic acid  2 mg Oral Daily   Gerhardt's butt cream   Topical BID   insulin aspart  0-15 Units Subcutaneous TID WC   insulin aspart  0-5 Units Subcutaneous QHS   prednisoLONE  40 mg Oral Daily   sodium chloride flush  10-40 mL Intracatheter Q12H    have reviewed scheduled and prn medications.  Physical Exam: General: NAD Heart: Regular rate, rhythm. No murmurs. Lungs: coarse breath sounds/rales b/l. Abdomen: Soft, non-tender, non-distended. Extremities: Trace pedal edema.  Neuro: Awake, alert, conversing appropriately. No focal deficits.

## 2022-04-15 NOTE — Progress Notes (Signed)
NAME:  [name], MRN:  [id number], DOB:  14-Oct-1975, LOS: 5 ADMISSION DATE:  04/09/2022, CONSULTATION DATE:  04/10/2022 REFERRING MD:  Horton, CHIEF COMPLAINT:  Acute renal failure, anemia, thrombocytopenia   History of Present Illness:  [name] is a 46 y.o. 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 She was hypotensive to 90/43 on exam, EKG did not show arrhythmia or sign of ischemia.   Initial labs remarkable for K 1.9, creatinine 6.38, Hgb 7.0>6.0, platelets 118>82, BUN/Cr 72/6.54. No signs of active bleeding on exam. CT abdomen obtained showed severe fatty liver, artifact versus mild acute pancreatitis but no bowel obstruction.  She received total of 2L IVF.    She denies any new meds or significant NSAID use.  She denies significant dehydration. atient reports history of renal cell carcinoma in her family. This is concerning for her.  C. She has history of 4 shots of alcohol per day. The kidneys, visualized ureters, and the urinary bladder appear unremarkable.   She is unable to provide a urine sample.  She does state that she is urinating at home.  Second liter of fluids ordered in the ED.  Nephrology has been consulted.Hematology has been consulted. ( [place])   At time of admission, labs remarkable for Na 133, K 2.1, Cl 99, CO2 19, BUN 72, creatinine 6.54, calcium 8.7, anion gap 15, LDH 217, albumin 3.0, ak phos 105, lipase 267, AST 130, ALT 39, direct bili 5.5, total bili 10.2, CK 23, WBC 9.6, Hgb 6.0, platelets 82, vitamin B 12 1136, iron 181, saturation ratios 48, serum folate 3.2, ferritin 73, methylmalonic acid 193, GI Panel + for Norovirus but asymptomatic.   PCCM have been asked to admit patient to ICU and manage care.  Pertinent  Medical History  Tobacco use disorder GERD Anemia Bariatric surgery HTN EtOH  abuse  Significant Hospital Events: Including procedures, antibiotic start and stop dates in addition to other pertinent events   07/17 Admitted to ICU 07/17 Trialysis catheter placed 7/21 respiratory status did worsen, required BiPAP  Interim History / Subjective:  Feels a little bit better at present On oxygen supplementation Feels breathing is steady Diarrhea continues to improve  Objective   Blood pressure (!) 104/58, pulse (!) 101, temperature 98 F (36.7 C), temperature source Axillary, resp. rate (!) 33, height 5\' 6"  (1.676 m), weight 99.1 kg, last menstrual period 03/10/2022, SpO2 98 %.        Intake/Output Summary (Last 24 hours) at 04/15/2022 0733 Last data filed at 04/15/2022 0600 Gross per 24 hour  Intake 380.26 ml  Output 204 ml  Net 176.26 ml   Filed Weights   04/10/22 1957 04/11/22 0400 04/13/22 0500  Weight: 93 kg 93 kg 99.1 kg    Examination: Constitutional: Shortness of breath, Eyes: No icterus Cardio:Regular rate and rhythm.  Pulm: Few rhonchi Abdomen: Soft, bowel sounds appreciated MSK:0: Peripheral edema is better Skin:Warm and dry. No definite rashes or lesions. Skin is jaundiced. Neuro:Alert, oriented, no focal deficit noted. Psych:Normal mood and affect.  Resolved Hospital Problem list   Hyponatremia Elevated LDH Hypovolemic hock Metabolic acidosis Elevated LDH  Assessment & Plan:  Acute hypoxic respiratory failure Possible pneumonia Possibly due to fluid overload -Diuresed -Work of better breathing is better at present  Acute on chronic anemia No hemoptysis Thrombocytopenia -No schistocytes -Appreciate hematology follow-up -Thrombocytopenia likely related to  marrow suppression from alcohol use  Acute gastroenteritis secondary to norovirus -Diarrhea improving -Enteric precautions -Imodium as needed  Acute kidney injury Hepatorenal syndrome -Appreciate renal follow-up -No need for renal replacement therapy at present -Renal  parameters trending in the right direction  Acute alcoholic hepatitis Fatty liver disease -Continue prednisone 40 daily -Check Lille score 07/26 and reassess need for 28  day course versus discontinuation of prednisolone -Continue to trend coag panel  Tobacco abuse Alcohol abuse -Counseling    Best Practice (right click and "Reselect all SmartList Selections" daily)   Diet/type: Regular consistency (see orders) DVT prophylaxis: SCD GI prophylaxis: H2B Lines: Dialysis Catheter Foley:  N/A Code Status:  full code Last date of multidisciplinary goals of care discussion [07/17]  Labs   CBC: Recent Labs  Lab 04/10/22 1551 04/11/22 0205 04/11/22 1113 04/11/22 2115 04/12/22 0327 04/12/22 1015 04/12/22 2016 04/12/22 2252 04/13/22 0425 04/13/22 1152 04/13/22 2106 04/13/22 2154 04/14/22 0400 04/15/22 0542  WBC 7.7 11.2* 12.3* 12.8* 12.6*   < > 11.5*  --  10.5 12.1* 13.6*  --  16.8*  --   NEUTROABS 5.5 8.4* 9.4* 10.1* 10.2*  --   --   --   --   --   --   --   --   --   HGB 5.1* 7.5* 7.7* 7.9* 7.7*   < > 7.0*   < > 7.4* 7.7* 8.1* 8.8* 8.8* 9.9*  HCT 14.3* 20.7* 21.7* 22.4* 21.2*   < > 19.2*   < > 21.1* 21.2* 22.1* 26.0* 24.0* 29.0*  MCV 128.8* 108.9* 110.7* 110.9* 111.6*   < > 111.6*  --  110.5* 108.2* 107.8*  --  108.6*  --   PLT 77* 91* 93* 91* 88*   < > 69*  --  59* 68* 73*  --  74*  --    < > = values in this interval not displayed.    Basic Metabolic Panel: Recent Labs  Lab 04/09/22 2242 04/10/22 0905 04/11/22 0205 04/11/22 1113 04/12/22 2016 04/13/22 0425 04/13/22 1152 04/13/22 2106 04/13/22 2154 04/14/22 0400 04/15/22 0542  NA 134*   < > 134*   < > 135 135 135 136 139 138 143  K 1.9*   < > 2.4*   < > 3.6 3.1* 2.6* 3.2* 3.8 3.5 3.2*  CL 95*   < > 105   < > 105 104 102 99  --  102  --   CO2 22   < > 17*   < > 16* 18* 21* 22  --  22  --   GLUCOSE 124*   < > 135*   < > 253* 293* 186* 130*  --  133*  --   BUN 72*   < > 66*   < > 42* 43* 44* 44*  --  43*  --    CREATININE 6.38*   < > 5.72*   < > 3.09* 2.99* 2.80* 2.46*  --  2.52*  --   CALCIUM 9.6   < > 7.5*   < > 7.6* 7.4* 7.9* 7.6*  --  8.1*  --   MG 2.5*  --  2.7*  --   --   --  2.2  --   --  2.1  --   PHOS  --   --  2.4*  --   --   --   --   --   --   --   --    < > =  values in this interval not displayed.   GFR: Estimated Creatinine Clearance: 33.8 mL/min (A) (by C-G formula based on SCr of 2.52 mg/dL (H)). Recent Labs  Lab 04/10/22 1551 04/11/22 0205 04/12/22 1150 04/12/22 2016 04/13/22 0425 04/13/22 1152 04/13/22 2106 04/14/22 0400  WBC 7.7   < >  --    < > 10.5 12.1* 13.6* 16.8*  LATICACIDVEN 1.0  --  2.9*  --   --   --   --   --    < > = values in this interval not displayed.    Liver Function Tests: Recent Labs  Lab 04/11/22 0205 04/12/22 0327 04/13/22 0425 04/14/22 0400 04/15/22 0534  AST 136* 144* 111* 124* 114*  ALT 46* 52* 48* 56* 57*  ALKPHOS 102 110 89 91 88  BILITOT 12.0* 12.2* 13.2* 12.4* 11.1*  PROT 6.6 6.8 7.6 7.5 6.6  ALBUMIN 2.1* 2.1* 3.3* 2.9* 2.5*   Recent Labs  Lab 04/10/22 0234 04/11/22 0205  LIPASE 267* 103*   No results for input(s): "AMMONIA" in the last 168 hours.  ABG    Component Value Date/Time   PHART 7.451 (H) 04/15/2022 0542   PCO2ART 34.8 04/15/2022 0542   PO2ART 64 (L) 04/15/2022 0542   HCO3 24.3 04/15/2022 0542   TCO2 25 04/15/2022 0542   O2SAT 94 04/15/2022 0542     Coagulation Profile: Recent Labs  Lab 04/10/22 1551 04/11/22 0205  INR 1.8* 1.7*    Cardiac Enzymes: Recent Labs  Lab 04/09/22 2320  CKTOTAL 23*    HbA1C: Hgb A1c MFr Bld  Date/Time Value Ref Range Status  04/13/2022 04:25 AM 3.8 (L) 4.8 - 5.6 % Final    Comment:    (NOTE) Pre diabetes:          5.7%-6.4%  Diabetes:              >6.4%  Glycemic control for   <7.0% adults with diabetes     CBG: Recent Labs  Lab 04/13/22 2052 04/14/22 0713 04/14/22 1110 04/14/22 1641 04/14/22 2119  GLUCAP 142* 105* 131* 132* 198*    Review of  Systems:   Negative unless otherwise stated.  Past Medical History:  She,  has a past medical history of Acid reflux, Anemia, and Ulcer.   Surgical History:   Past Surgical History:  Procedure Laterality Date   ABDOMINAL SURGERY     abdominoplasty   BREAST BIOPSY Right 2013   CHOLECYSTECTOMY N/A 11/23/2019   Procedure: LAPAROSCOPIC CHOLECYSTECTOMY WITH INTRAOPERATIVE CHOLANGIOGRAM;  Surgeon: Kieth Brightly Arta Bruce, MD;  Location: [facility];  Service: General;  Laterality: N/A;   COSMETIC SURGERY     tummy tuck   HERNIA REPAIR     ROUX-EN-Y PROCEDURE     TUMOR REMOVAL     WRIST GANGLION EXCISION       Social History:   reports that she has been smoking cigarettes. She has a 6.25 pack-year smoking history. She has never used smokeless tobacco. She reports that she does not currently use alcohol after a past usage of about 12.0 standard drinks of alcohol per week. She reports that she does not use drugs.   Family History:  Her family history includes Breast cancer in her maternal aunt; Cancer in her mother and paternal aunt; Lung cancer in her paternal uncle; Pancreatic cancer in her maternal aunt.   Allergies Allergies  Allergen Reactions   Tape Rash    Clear tape     Home Medications  Prior to Admission  medications   Medication Sig Start Date End Date Taking? Authorizing Provider  busPIRone (BUSPAR) 15 MG tablet Take 15 mg by mouth 3 (three) times daily as needed (anxiety). 06/09/21  Yes [provider]  Cyanocobalamin (B-12 PO) Take 1 tablet by mouth daily.   Yes [provider]  sertraline (ZOLOFT) 100 MG tablet Take 100 mg by mouth as needed. 06/15/21  Yes [provider]  folic acid (FOLVITE) 1 MG tablet Take 1 tablet (1 mg total) by mouth daily. Patient not taking: Reported on 04/11/2022 01/20/22   Dede Query T, PA-C  gabapentin (NEURONTIN) 300 MG capsule Take 1 capsule (300 mg total) by mouth 3 (three) times daily. Patient not taking: Reported on  04/11/2022 10/04/20   Raylene Everts, MD  valsartan (DIOVAN) 80 MG tablet Take 80 mg by mouth daily. Patient not taking: Reported on 04/11/2022 06/27/21   [provider]  pantoprazole (PROTONIX) 20 MG tablet Take 1 tablet (20 mg total) by mouth daily for 14 days. 08/05/18 10/04/20  Couture, Cortni S, PA-C    The patient is critically ill with multiple organ systems failure and requires high complexity decision making for assessment and support, frequent evaluation and titration of therapies, application of advanced monitoring technologies and extensive interpretation of multiple databases. Critical Care Time devoted to patient care services described in this note independent of APP/resident time (if applicable)  is 35 minutes.   Sherrilyn Rist MD [facility] Personal pager: See Amion If unanswered, please page CCM On-call: [phone]

## 2022-04-16 LAB — COMPREHENSIVE METABOLIC PANEL
ALT: 63 U/L — ABNORMAL HIGH (ref 0–44)
AST: 99 U/L — ABNORMAL HIGH (ref 15–41)
Albumin: 2.4 g/dL — ABNORMAL LOW (ref 3.5–5.0)
Alkaline Phosphatase: 91 U/L (ref 38–126)
Anion gap: 11 (ref 5–15)
BUN: 40 mg/dL — ABNORMAL HIGH (ref 6–20)
CO2: 21 mmol/L — ABNORMAL LOW (ref 22–32)
Calcium: 7.4 mg/dL — ABNORMAL LOW (ref 8.9–10.3)
Chloride: 106 mmol/L (ref 98–111)
Creatinine, Ser: 2.1 mg/dL — ABNORMAL HIGH (ref 0.44–1.00)
GFR, Estimated: 29 mL/min — ABNORMAL LOW (ref >60)
Glucose, Bld: 142 mg/dL — ABNORMAL HIGH (ref 70–99)
Potassium: 3.5 mmol/L (ref 3.5–5.1)
Sodium: 138 mmol/L (ref 135–145)
Total Bilirubin: 9.4 mg/dL — ABNORMAL HIGH (ref 0.3–1.2)
Total Protein: 6.4 g/dL — ABNORMAL LOW (ref 6.5–8.1)

## 2022-04-16 LAB — GLUCOSE, CAPILLARY
Glucose-Capillary: 125 mg/dL — ABNORMAL HIGH (ref 70–99)
Glucose-Capillary: 134 mg/dL — ABNORMAL HIGH (ref 70–99)
Glucose-Capillary: 234 mg/dL — ABNORMAL HIGH (ref 70–99)
Glucose-Capillary: 159 mg/dL — ABNORMAL HIGH (ref 70–99)

## 2022-04-16 LAB — CBC
HCT: 23.4 % — ABNORMAL LOW (ref 36.0–46.0)
Hemoglobin: 8.2 g/dL — ABNORMAL LOW (ref 12.0–15.0)
MCH: 40.2 pg — ABNORMAL HIGH (ref 26.0–34.0)
MCHC: 35 g/dL (ref 30.0–36.0)
MCV: 114.7 fL — ABNORMAL HIGH (ref 80.0–100.0)
Platelets: 47 10*3/uL — ABNORMAL LOW (ref 150–400)
RBC: 2.04 MIL/uL — ABNORMAL LOW (ref 3.87–5.11)
WBC: 13.2 10*3/uL — ABNORMAL HIGH (ref 4.0–10.5)
nRBC: 0.2 % (ref 0.0–0.2)

## 2022-04-16 LAB — MAGNESIUM: Magnesium: 2 mg/dL (ref 1.7–2.4)

## 2022-04-16 MED ORDER — AMOXICILLIN-POT CLAVULANATE 875-125 MG PO TABS
1.0000 | ORAL_TABLET | Freq: Two times a day (BID) | ORAL | Status: AC
Start: 2022-04-16 — End: 2022-04-20
  Administered 2022-04-16 – 2022-04-20 (×10): 1 via ORAL
  Filled 2022-04-16 (×12): qty 1

## 2022-04-16 MED ORDER — POTASSIUM CHLORIDE CRYS ER 20 MEQ PO TBCR
40.0000 meq | EXTENDED_RELEASE_TABLET | Freq: Once | ORAL | Status: AC
  Administered 2022-04-16: 40 meq via ORAL
  Filled 2022-04-16: qty 2

## 2022-04-16 MED ORDER — SALINE SPRAY 0.65 % NA SOLN
1.0000 | NASAL | Status: DC | PRN
  Administered 2022-04-16: 1 via NASAL
  Filled 2022-04-16: qty 44

## 2022-04-16 MED ORDER — DEXTROMETHORPHAN POLISTIREX ER 30 MG/5ML PO SUER
30.0000 mg | Freq: Two times a day (BID) | ORAL | Status: DC
  Administered 2022-04-16 – 2022-04-22 (×13): 30 mg via ORAL
  Filled 2022-04-16 (×15): qty 5

## 2022-04-16 NOTE — Progress Notes (Signed)
NAME:  TANEA MOGA, MRN:  [id number], DOB:  1975/11/16, LOS: 6 ADMISSION DATE:  04/09/2022, CONSULTATION DATE:  04/10/2022 REFERRING MD:  Horton, CHIEF COMPLAINT:  Acute renal failure, anemia, thrombocytopenia   History of Present Illness:  Makynleigh R. Prospero is a 46 y.o. 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 She was hypotensive to 90/43 on exam, EKG did not show arrhythmia or sign of ischemia.   Initial labs remarkable for K 1.9, creatinine 6.38, Hgb 7.0>6.0, platelets 118>82, BUN/Cr 72/6.54. No signs of active bleeding on exam. CT abdomen obtained showed severe fatty liver, artifact versus mild acute pancreatitis but no bowel obstruction.  She received total of 2L IVF.    She denies any new meds or significant NSAID use.  She denies significant dehydration. atient reports history of renal cell carcinoma in her family. This is concerning for her.  C. She has history of 4 shots of alcohol per day. The kidneys, visualized ureters, and the urinary bladder appear unremarkable.   She is unable to provide a urine sample.  She does state that she is urinating at home.  Second liter of fluids ordered in the ED.  Nephrology has been consulted.Hematology has been consulted. ( [place])   At time of admission, labs remarkable for Na 133, K 2.1, Cl 99, CO2 19, BUN 72, creatinine 6.54, calcium 8.7, anion gap 15, LDH 217, albumin 3.0, ak phos 105, lipase 267, AST 130, ALT 39, direct bili 5.5, total bili 10.2, CK 23, WBC 9.6, Hgb 6.0, platelets 82, vitamin B 12 1136, iron 181, saturation ratios 48, serum folate 3.2, ferritin 73, methylmalonic acid 193, GI Panel + for Norovirus but asymptomatic.   PCCM have been asked to admit patient to ICU and manage care.  Pertinent  Medical History  Tobacco use disorder GERD Anemia Bariatric surgery HTN EtOH  abuse  Significant Hospital Events: Including procedures, antibiotic start and stop dates in addition to other pertinent events   07/17 Admitted to ICU 07/17 Trialysis catheter placed 7/21 respiratory status did worsen, required BiPAP 7/22 no overnight events  Interim History / Subjective:  Continues to improve On oxygen supplementation Breathing improving Diarrhea improving  Objective   Blood pressure (!) 106/55, pulse 95, temperature 97.9 F (36.6 C), temperature source Oral, resp. rate 17, height 5\' 6"  (1.676 m), weight 99.1 kg, last menstrual period 03/10/2022, SpO2 98 %.        Intake/Output Summary (Last 24 hours) at 04/16/2022 0925 Last data filed at 04/16/2022 0400 Gross per 24 hour  Intake 1490.91 ml  Output --  Net 1490.91 ml   Filed Weights   04/10/22 1957 04/11/22 0400 04/13/22 0500  Weight: 93 kg 93 kg 99.1 kg    Examination: Constitutional: No overnight events, on oxygen supplementation, acutely ill-appearing eyes: No icterus Cardio: S1-S2 appreciated Pulm: Fair air movement, few rales at bases Abdomen: Soft, bowel sounds appreciated MSK:0: Peripheral edema improving Skin: Skin is warm and dry Neuro:Alert, oriented, no focal deficit noted. Psych:Normal mood and affect.  Resolved Hospital Problem list   Hyponatremia Elevated LDH Hypovolemic hock Metabolic acidosis Elevated LDH  Assessment & Plan:   Acute hypoxemic respiratory failure Possible pneumonia Concern for fluid overload -Tolerated diuresis well -Work of breathing better  For possible aspiration pneumonia -On Unasyn -Will change to Augmentin  Acute on chronic anemia Thrombocytopenia  -No schistocytes  -Appreciate hematology follow-up - -  Thrombocytopenia likely related to marrow suppression from alcohol use  Acute gastroenteritis secondary to norovirus -Diarrhea improving -Continue enteric precautions -Imodium as needed  Acute kidney injury Hepatorenal syndrome -Appreciate  renal follow-up -Renal signed off 7/22 -Renal parameters trending in the right direction but BUN/creatinine still elevated  Acute alcoholic hepatitis Fatty liver disease -Continue prednisone 40 daily -Check Lilly score 7/26 and reassess need for 28-day course of prednisone versus discontinuation -Continue to trend coag panel  History of tobacco abuse -History of alcohol abuse -Counseling  She is trending in the right direction does still acutely ill -Will plan to transition    Best Practice (right click and "Reselect all SmartList Selections" daily)   Diet/type: Regular consistency (see orders) DVT prophylaxis: SCD GI prophylaxis: H2B Lines: N/A Foley:  N/A Code Status:  full code Last date of multidisciplinary goals of care discussion [07/17]  Labs   CBC: Recent Labs  Lab 04/10/22 1551 04/11/22 0205 04/11/22 1113 04/11/22 2115 04/12/22 0327 04/12/22 1015 04/13/22 0425 04/13/22 1152 04/13/22 2106 04/13/22 2154 04/14/22 0400 04/15/22 0542 04/16/22 0102  WBC 7.7 11.2* 12.3* 12.8* 12.6*   < > 10.5 12.1* 13.6*  --  16.8*  --  13.2*  NEUTROABS 5.5 8.4* 9.4* 10.1* 10.2*  --   --   --   --   --   --   --   --   HGB 5.1* 7.5* 7.7* 7.9* 7.7*   < > 7.4* 7.7* 8.1* 8.8* 8.8* 9.9* 8.2*  HCT 14.3* 20.7* 21.7* 22.4* 21.2*   < > 21.1* 21.2* 22.1* 26.0* 24.0* 29.0* 23.4*  MCV 128.8* 108.9* 110.7* 110.9* 111.6*   < > 110.5* 108.2* 107.8*  --  108.6*  --  114.7*  PLT 77* 91* 93* 91* 88*   < > 59* 68* 73*  --  74*  --  47*   < > = values in this interval not displayed.    Basic Metabolic Panel: Recent Labs  Lab 04/09/22 2242 04/10/22 0905 04/11/22 0205 04/11/22 1113 04/13/22 1152 04/13/22 2106 04/13/22 2154 04/14/22 0400 04/15/22 0542 04/15/22 0700 04/16/22 0102  NA 134*   < > 134*   < > 135 136 139 138 143 136 138  K 1.9*   < > 2.4*   < > 2.6* 3.2* 3.8 3.5 3.2* 2.8* 3.5  CL 95*   < > 105   < > 102 99  --  102  --  102 106  CO2 22   < > 17*   < > 21* 22  --  22  --   22 21*  GLUCOSE 124*   < > 135*   < > 186* 130*  --  133*  --  148* 142*  BUN 72*   < > 66*   < > 44* 44*  --  43*  --  43* 40*  CREATININE 6.38*   < > 5.72*   < > 2.80* 2.46*  --  2.52*  --  2.25* 2.10*  CALCIUM 9.6   < > 7.5*   < > 7.9* 7.6*  --  8.1*  --  7.1* 7.4*  MG 2.5*  --  2.7*  --  2.2  --   --  2.1  --   --  2.0  PHOS  --   --  2.4*  --   --   --   --   --   --  2.7  --    < > = values in  this interval not displayed.   GFR: Estimated Creatinine Clearance: 40.6 mL/min (A) (by C-G formula based on SCr of 2.1 mg/dL (H)). Recent Labs  Lab 04/10/22 1551 04/11/22 0205 04/12/22 1150 04/12/22 2016 04/13/22 1152 04/13/22 2106 04/14/22 0400 04/16/22 0102  WBC 7.7   < >  --    < > 12.1* 13.6* 16.8* 13.2*  LATICACIDVEN 1.0  --  2.9*  --   --   --   --   --    < > = values in this interval not displayed.    Liver Function Tests: Recent Labs  Lab 04/12/22 0327 04/13/22 0425 04/14/22 0400 04/15/22 0534 04/15/22 0700 04/16/22 0102  AST 144* 111* 124* 114*  --  99*  ALT 52* 48* 56* 57*  --  63*  ALKPHOS 110 89 91 88  --  91  BILITOT 12.2* 13.2* 12.4* 11.1*  --  9.4*  PROT 6.8 7.6 7.5 6.6  --  6.4*  ALBUMIN 2.1* 3.3* 2.9* 2.5* 2.3* 2.4*   Recent Labs  Lab 04/10/22 0234 04/11/22 0205  LIPASE 267* 103*   No results for input(s): "AMMONIA" in the last 168 hours.  ABG    Component Value Date/Time   PHART 7.451 (H) 04/15/2022 0542   PCO2ART 34.8 04/15/2022 0542   PO2ART 64 (L) 04/15/2022 0542   HCO3 24.3 04/15/2022 0542   TCO2 25 04/15/2022 0542   O2SAT 94 04/15/2022 0542     Coagulation Profile: Recent Labs  Lab 04/10/22 1551 04/11/22 0205  INR 1.8* 1.7*    Cardiac Enzymes: Recent Labs  Lab 04/09/22 2320  CKTOTAL 23*    HbA1C: Hgb A1c MFr Bld  Date/Time Value Ref Range Status  04/13/2022 04:25 AM 3.8 (L) 4.8 - 5.6 % Final    Comment:    (NOTE) Pre diabetes:          5.7%-6.4%  Diabetes:              >6.4%  Glycemic control for   <7.0% adults  with diabetes     CBG: Recent Labs  Lab 04/15/22 0752 04/15/22 1110 04/15/22 1538 04/15/22 2157 04/16/22 0809  GLUCAP 146* 148* 140* 156* 125*    Review of Systems:   Negative unless otherwise stated.  Past Medical History:  She,  has a past medical history of Acid reflux, Anemia, and Ulcer.   Surgical History:   Past Surgical History:  Procedure Laterality Date   ABDOMINAL SURGERY     abdominoplasty   BREAST BIOPSY Right 2013   CHOLECYSTECTOMY N/A 11/23/2019   Procedure: LAPAROSCOPIC CHOLECYSTECTOMY WITH INTRAOPERATIVE CHOLANGIOGRAM;  Surgeon: Sheliah Hatch De Blanch, MD;  Location: [facility] OR;  Service: General;  Laterality: N/A;   COSMETIC SURGERY     tummy tuck   HERNIA REPAIR     ROUX-EN-Y PROCEDURE     TUMOR REMOVAL     WRIST GANGLION EXCISION       Social History:   reports that she has been smoking cigarettes. She has a 6.25 pack-year smoking history. She has never used smokeless tobacco. She reports that she does not currently use alcohol after a past usage of about 12.0 standard drinks of alcohol per week. She reports that she does not use drugs.   Family History:  Her family history includes Breast cancer in her maternal aunt; Cancer in her mother and paternal aunt; Lung cancer in her paternal uncle; Pancreatic cancer in her maternal aunt.   Allergies Allergies  Allergen Reactions   Tape  Rash    Clear tape     Home Medications  Prior to Admission medications   Medication Sig Start Date End Date Taking? Authorizing Provider  busPIRone (BUSPAR) 15 MG tablet Take 15 mg by mouth 3 (three) times daily as needed (anxiety). 06/09/21  Yes [provider]  Cyanocobalamin (B-12 PO) Take 1 tablet by mouth daily.   Yes [provider]  sertraline (ZOLOFT) 100 MG tablet Take 100 mg by mouth as needed. 06/15/21  Yes [provider]  folic acid (FOLVITE) 1 MG tablet Take 1 tablet (1 mg total) by mouth daily. Patient not taking: Reported on  04/11/2022 01/20/22   Georga Kaufmann T, PA-C  gabapentin (NEURONTIN) 300 MG capsule Take 1 capsule (300 mg total) by mouth 3 (three) times daily. Patient not taking: Reported on 04/11/2022 10/04/20   Eustace Moore, MD  valsartan (DIOVAN) 80 MG tablet Take 80 mg by mouth daily. Patient not taking: Reported on 04/11/2022 06/27/21   [provider]  pantoprazole (PROTONIX) 20 MG tablet Take 1 tablet (20 mg total) by mouth daily for 14 days. 08/05/18 10/04/20  Couture, Cortni S, PA-C    The patient is critically ill with multiple organ systems failure and requires high complexity decision making for assessment and support, frequent evaluation and titration of therapies, application of advanced monitoring technologies and extensive interpretation of multiple databases. Critical Care Time devoted to patient care services described in this note independent of APP/resident time (if applicable)  is 32 minutes.   Virl Diamond MD [facility] Personal pager: See Amion If unanswered, please page CCM On-call: #[phone]

## 2022-04-16 NOTE — Plan of Care (Signed)

## 2022-04-16 NOTE — Progress Notes (Signed)
   04/16/22 1300  Clinical Encounter Type  Visited With Patient  Visit Type Initial  Referral From Other (Comment) (family member)  Consult/Referral To [employer] met with patient and encouraged self-care. Also, provided space for patient to share grief of her son who are murdered in 2021. Chaplain prayed with patient for emotional, physical, and spiritual healing.    Melody Haver, Resident Chaplain [phone]

## 2022-04-17 ENCOUNTER — Encounter: Payer: Self-pay | Admitting: Physician Assistant

## 2022-04-17 DIAGNOSIS — D696 Thrombocytopenia, unspecified: Secondary | ICD-10-CM

## 2022-04-17 DIAGNOSIS — R571 Hypovolemic shock: Secondary | ICD-10-CM

## 2022-04-17 DIAGNOSIS — K767 Hepatorenal syndrome: Secondary | ICD-10-CM

## 2022-04-17 DIAGNOSIS — D649 Anemia, unspecified: Secondary | ICD-10-CM

## 2022-04-17 DIAGNOSIS — K7010 Alcoholic hepatitis without ascites: Secondary | ICD-10-CM

## 2022-04-17 DIAGNOSIS — N179 Acute kidney failure, unspecified: Secondary | ICD-10-CM

## 2022-04-17 DIAGNOSIS — A0811 Acute gastroenteropathy due to Norwalk agent: Secondary | ICD-10-CM

## 2022-04-17 LAB — COMPREHENSIVE METABOLIC PANEL
ALT: 81 U/L — ABNORMAL HIGH (ref 0–44)
AST: 131 U/L — ABNORMAL HIGH (ref 15–41)
Albumin: 2.3 g/dL — ABNORMAL LOW (ref 3.5–5.0)
Alkaline Phosphatase: 95 U/L (ref 38–126)
Anion gap: 8 (ref 5–15)
BUN: 45 mg/dL — ABNORMAL HIGH (ref 6–20)
CO2: 24 mmol/L (ref 22–32)
Calcium: 7.9 mg/dL — ABNORMAL LOW (ref 8.9–10.3)
Chloride: 107 mmol/L (ref 98–111)
Creatinine, Ser: 1.97 mg/dL — ABNORMAL HIGH (ref 0.44–1.00)
GFR, Estimated: 32 mL/min — ABNORMAL LOW (ref >60)
Glucose, Bld: 124 mg/dL — ABNORMAL HIGH (ref 70–99)
Potassium: 3.7 mmol/L (ref 3.5–5.1)
Sodium: 139 mmol/L (ref 135–145)
Total Bilirubin: 9.5 mg/dL — ABNORMAL HIGH (ref 0.3–1.2)
Total Protein: 6.8 g/dL (ref 6.5–8.1)

## 2022-04-17 LAB — GLUCOSE, CAPILLARY
Glucose-Capillary: 137 mg/dL — ABNORMAL HIGH (ref 70–99)
Glucose-Capillary: 132 mg/dL — ABNORMAL HIGH (ref 70–99)
Glucose-Capillary: 180 mg/dL — ABNORMAL HIGH (ref 70–99)
Glucose-Capillary: 143 mg/dL — ABNORMAL HIGH (ref 70–99)

## 2022-04-17 LAB — CBC WITH DIFFERENTIAL/PLATELET
Abs Immature Granulocytes: 0.13 10*3/uL — ABNORMAL HIGH (ref 0.00–0.07)
Basophils Absolute: 0 10*3/uL (ref 0.0–0.1)
Basophils Relative: 0 %
Eosinophils Absolute: 0 10*3/uL (ref 0.0–0.5)
Eosinophils Relative: 0 %
HCT: 23 % — ABNORMAL LOW (ref 36.0–46.0)
Hemoglobin: 8 g/dL — ABNORMAL LOW (ref 12.0–15.0)
Immature Granulocytes: 1 %
Lymphocytes Relative: 11 %
Lymphs Abs: 1.3 10*3/uL (ref 0.7–4.0)
MCH: 39.4 pg — ABNORMAL HIGH (ref 26.0–34.0)
MCHC: 34.8 g/dL (ref 30.0–36.0)
MCV: 113.3 fL — ABNORMAL HIGH (ref 80.0–100.0)
Monocytes Absolute: 0.8 10*3/uL (ref 0.1–1.0)
Monocytes Relative: 6 %
Neutro Abs: 10.2 10*3/uL — ABNORMAL HIGH (ref 1.7–7.7)
Neutrophils Relative %: 82 %
Platelets: 50 10*3/uL — ABNORMAL LOW (ref 150–400)
RBC: 2.03 MIL/uL — ABNORMAL LOW (ref 3.87–5.11)
WBC: 12.5 10*3/uL — ABNORMAL HIGH (ref 4.0–10.5)
nRBC: 0.6 % — ABNORMAL HIGH (ref 0.0–0.2)

## 2022-04-17 LAB — C DIFFICILE (CDIFF) QUICK SCRN (NO PCR REFLEX)
C Diff antigen: NEGATIVE
C Diff interpretation: NOT DETECTED
C Diff toxin: NEGATIVE

## 2022-04-17 LAB — RETICULOCYTES
Immature Retic Fract: 14.1 % (ref 2.3–15.9)
RBC.: 2.09 MIL/uL — ABNORMAL LOW (ref 3.87–5.11)
Retic Count, Absolute: 62.7 10*3/uL (ref 19.0–186.0)
Retic Ct Pct: 3 % (ref 0.4–3.1)

## 2022-04-17 LAB — APTT: aPTT: 33 seconds (ref 24–36)

## 2022-04-17 LAB — LACTATE DEHYDROGENASE: LDH: 212 U/L — ABNORMAL HIGH (ref 98–192)

## 2022-04-17 LAB — PROTIME-INR
INR: 1.7 — ABNORMAL HIGH (ref 0.8–1.2)
Prothrombin Time: 19.4 seconds — ABNORMAL HIGH (ref 11.4–15.2)

## 2022-04-17 MED ORDER — SODIUM CHLORIDE 0.9 % IV SOLN: 6.2500 mg | Freq: Four times a day (QID) | INTRAVENOUS | Status: DC | PRN

## 2022-04-17 NOTE — Progress Notes (Signed)
Educated pt on need for stool sample. Pt verbalizes understanding. Hat provided in toilet for collection.

## 2022-04-17 NOTE — Progress Notes (Signed)
PROGRESS NOTE    [name]  NTI:[id number] DOB: 18-Apr-1976 DOA: 04/09/2022 PCP: Patient, No Pcp Per   Brief Narrative: [name] is a 46 y.o. female with a history of tobacco use, GERD, anemia, s/p bariatric surgery, hypertension. Patient presented secondary to fatigue and dizziness and found to have hypotension and AKI requiring initiation of vasopressors in addition to IV fluids for resuscitation.    Assessment and Plan:  Acute respiratory failure with hypoxia Mild with associated presumed aspiration pneumonia. Oxygen requirement up to 4 l/min via supplemental oxygen. Patient did not require intubation while in the ICU.  Possible aspiration pneumonia Patient started on Unasyn IV and transitioned to Augmentin.  -Continue Augmentin  Acute on chronic anemia Baseline hemoglobin appears to be around 12-14. Hemoglobin on presentation of 7 with downtrend as low as 5.1. patient has required 3 units of PRBC to date. Currently, patient reports bright red blood per rectum but states this is from tears she has in the area. She reports that she has not seen gross bloody stools. She has no history of colonoscopy but has a history of EGD in the past with evidence of ulcers, per her report. Complicated by thrombocytopenia. Complicated by alcohol use and liver disease in addition to possible marrow suppression. Hemoglobin drifting downward. -[facility] consult today -Trend CBC  Hypovolemic shock Present on admission. Patient admitted to the ICU for vasopressor support and IV fluids. Patient was started on Vasopressin and Norepinephrine on 7/17, which were weaned off on 7/21. Resolved.  Thrombocytopenia Likely secondary to underlying liver disease. Hematology consulted. Platelets stable.  Acute gastroenteritis secondary to norovirus -Continue supportive care -Enteric precautions  AKI Hepatorenal syndrome Nephrology consulted. Creatinine of 6.38 on admission with peak creatinine  of 6.54. Patient managed with vasopressors and IV fluids with steady improvement of kidney function. No renal replacement therapy initiated. Nephrology signed off.  Acute alcoholic hepatitis Severe fatty liver disease Patient started on prednisolone for treatment. GI consulted for above. -GI recommendations  Tobacco abuse Alcohol abuse Counseled this admission. TOC consulted.   DVT prophylaxis: SCDs Code Status:   Code Status: Full Code Family Communication: None at bedside Disposition Plan: Discharge likely in 2-4 days pending stable hemoglobin, GI recommendations, ability to wean to room air if able.   Consultants:  PCCM Nephrology Gastroenterology  Procedures:  None  Antimicrobials: Unasyn Augmentin    Subjective: Patient reports some blood from a sore in her buttock area but no frank bleeding per rectum. Some coughing. No dyspnea. Continued diarrhea.  Objective: BP (!) 104/56 (BP Location: Left Arm)   Pulse 93   Temp 98.2 F (36.8 C) (Oral)   Resp 18   Ht 5\' 6"  (1.676 m)   Wt 103.7 kg   LMP 03/10/2022 (Approximate)   SpO2 95%   BMI 36.90 kg/m   Examination:  General exam: Appears calm and comfortable Respiratory system: Respiratory effort normal. Gastrointestinal system: Abdomen is nondistended, soft and nontender. Central nervous system: Alert and oriented. No focal neurological deficits. Musculoskeletal: Bilateral LE edema. No calf tenderness Skin: No cyanosis. No rashes Psychiatry: Judgement and insight appear normal. Mood & affect appropriate.    Data Reviewed: I have personally reviewed following labs and imaging studies  CBC Lab Results  Component Value Date   WBC 12.5 (H) 04/17/2022   RBC 2.03 (L) 04/17/2022   RBC 2.09 (L) 04/17/2022   HGB 8.0 (L) 04/17/2022   HCT 23.0 (L) 04/17/2022   MCV 113.3 (H) 04/17/2022   MCH  39.4 (H) 04/17/2022   PLT 50 (L) 04/17/2022   MCHC 34.8 04/17/2022   RDW Not Measured 04/17/2022   LYMPHSABS 1.3  04/17/2022   MONOABS 0.8 04/17/2022   EOSABS 0.0 04/17/2022   BASOSABS 0.0 04/17/2022     Last metabolic panel Lab Results  Component Value Date   NA 139 04/17/2022   K 3.7 04/17/2022   CL 107 04/17/2022   CO2 24 04/17/2022   BUN 45 (H) 04/17/2022   CREATININE 1.97 (H) 04/17/2022   GLUCOSE 124 (H) 04/17/2022   GFRNONAA 32 (L) 04/17/2022   GFRAA >60 11/24/2019   CALCIUM 7.9 (L) 04/17/2022   PHOS 2.7 04/15/2022   PROT 6.8 04/17/2022   ALBUMIN 2.3 (L) 04/17/2022   BILITOT 9.5 (H) 04/17/2022   ALKPHOS 95 04/17/2022   AST 131 (H) 04/17/2022   ALT 81 (H) 04/17/2022   ANIONGAP 8 04/17/2022    GFR: Estimated Creatinine Clearance: 44.4 mL/min (A) (by C-G formula based on SCr of 1.97 mg/dL (H)).  Recent Results (from the past 240 hour(s))  Gastrointestinal Panel by PCR , Stool     Status: Abnormal   Collection Time: 04/10/22  8:50 AM   Specimen: Stool  Result Value Ref Range Status   Campylobacter species NOT DETECTED NOT DETECTED Final   Plesimonas shigelloides NOT DETECTED NOT DETECTED Final   Salmonella species NOT DETECTED NOT DETECTED Final   Yersinia enterocolitica NOT DETECTED NOT DETECTED Final   Vibrio species NOT DETECTED NOT DETECTED Final   Vibrio cholerae NOT DETECTED NOT DETECTED Final   Enteroaggregative E coli (EAEC) NOT DETECTED NOT DETECTED Final   Enteropathogenic E coli (EPEC) NOT DETECTED NOT DETECTED Final   Enterotoxigenic E coli (ETEC) NOT DETECTED NOT DETECTED Final   Shiga like toxin producing E coli (STEC) NOT DETECTED NOT DETECTED Final   Shigella/Enteroinvasive E coli (EIEC) NOT DETECTED NOT DETECTED Final   Cryptosporidium NOT DETECTED NOT DETECTED Final   Cyclospora cayetanensis NOT DETECTED NOT DETECTED Final   Entamoeba histolytica NOT DETECTED NOT DETECTED Final   Giardia lamblia NOT DETECTED NOT DETECTED Final   Adenovirus F40/41 NOT DETECTED NOT DETECTED Final   Astrovirus NOT DETECTED NOT DETECTED Final   Norovirus GI/GII DETECTED (A)  NOT DETECTED Final    Comment: RESULT CALLED TO, READ BACK BY AND VERIFIED WITH: TONYA SHELTON 04/10/22 1418 KLW    Rotavirus A NOT DETECTED NOT DETECTED Final   Sapovirus (I, II, IV, and V) NOT DETECTED NOT DETECTED Final    Comment: Performed at [facility], [address] [place], [place] [address]  Culture, blood (Routine X 2) w Reflex to ID Panel     Status: None   Collection Time: 04/10/22  4:04 PM   Specimen: BLOOD RIGHT HAND  Result Value Ref Range Status   Specimen Description BLOOD RIGHT HAND  Final   Special Requests   Final    BOTTLES DRAWN AEROBIC AND ANAEROBIC Blood Culture adequate volume   Culture   Final    NO GROWTH 5 DAYS Performed at [facility], [address] [place], [place] [address]    Report Status 04/15/2022 FINAL  Final  Gastrointestinal Panel by PCR , Stool     Status: None   Collection Time: 04/11/22  8:50 AM  Result Value Ref Range Status   Campylobacter species NOT DETECTED NOT DETECTED Final   Plesimonas shigelloides NOT DETECTED NOT DETECTED Final   Salmonella species NOT DETECTED NOT DETECTED Final   Yersinia  enterocolitica NOT DETECTED NOT DETECTED Final   Vibrio species NOT DETECTED NOT DETECTED Final   Vibrio cholerae NOT DETECTED NOT DETECTED Final   Enteroaggregative E coli (EAEC) NOT DETECTED NOT DETECTED Final   Enteropathogenic E coli (EPEC) NOT DETECTED NOT DETECTED Final   Enterotoxigenic E coli (ETEC) NOT DETECTED NOT DETECTED Final   Shiga like toxin producing E coli (STEC) NOT DETECTED NOT DETECTED Final   Shigella/Enteroinvasive E coli (EIEC) NOT DETECTED NOT DETECTED Final   Cryptosporidium NOT DETECTED NOT DETECTED Final   Cyclospora cayetanensis NOT DETECTED NOT DETECTED Final   Entamoeba histolytica NOT DETECTED NOT DETECTED Final   Giardia lamblia NOT DETECTED NOT DETECTED Final   Adenovirus F40/41 NOT DETECTED NOT DETECTED Final   Astrovirus NOT DETECTED NOT DETECTED Final   Norovirus GI/GII NOT DETECTED  NOT DETECTED Final   Rotavirus A NOT DETECTED NOT DETECTED Final   Sapovirus (I, II, IV, and V) NOT DETECTED NOT DETECTED Final    Comment: Performed at [facility], [address], [place], [place] [address]      Radiology Studies: No results found.    LOS: 7 days    Jacquelin Hawking, MD [facility] 04/17/2022, 11:48 AM   If 7PM-7AM, please contact night-coverage [email or web address]

## 2022-04-17 NOTE — Consult Note (Addendum)
                                                                           [facility] Consult: 11:26 AM 04/17/2022  LOS: 7 days    Referring Provider: Dr R Nettey  Primary Care Physician:  [facility].   Primary Gastroenterologist:  unassigned. [facility]. Tracy Nance PA, [name] MD.       Reason for Consultation:  anemia.     HPI: [name] is a 46 y.o. female.  PMH Obesity.  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  2021 lap chole.   Panniculitis, status post panniculectomy.  Internal hernia repair 09/2010.  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  Iron deficiency anemia.  Vitamin B1,  Vit  B 12, vitamin D deficient.  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  Last IV iron infusion 06/2021. No prior PRBCs EtOH hepatitis in 2021, 2022. Care everywhere records indicate chronic marginal/anastomotic ulcers w multiple previous EGDs.  Twice 2011 (multiple small ulcers at jejunal side of pouch with normal gastric pouch.  Twice in 2012 (anastomotic ulcer), once 2013 (anastomotic ulcer 15 mm sized), twice 2014, once 2017 (report not found) . Latest EGD was 09/2018 (only path reprt found: Gastric mucosa with no diagnostic abnormality, no H. Pylori) . Several upper GI studies, the last in 2014 and did not demonstrate leak or stricture.  Hgb of 08/2018 12.9.  MCV 116.   Hgb 6 ... PRBCx 3 ... 9.9... 8.  MCV 113. Iron 65.  Iron sats 55%.  Ferritin 834.  Folate 3.2.  B12 1136 Platelets 50, previously 166 and 190 a few months back. GFR 8 ..  32.  BUN/Creat 72/6.3..  45/1.9 T bili 10.2..  13.2..  9.5.  Alk phos 110..  95.  AST/ALT 144/46..131/81.  Lipase 267... 103.  Pt admitted over 1 week ago.  Reported 1 week diarrhea, weakness, jaundice. Hypovolemic.  Stool path panel negative.  Abnormal LFTs  w ETOH hepatitis, started Prednisolone 7/19.  AKI, anemia requiring 2 PRBCs, severe hypokalemia. Acute blood loss versus hemolytic anemia with thrombocytopenia. Dr. Ennever has seen her regarding anemia, thrombocytopenia MD reports pt has bleeding from skin but no overt GI bleeding.  Diarrhea, loose brown to watery stools occasionally.   Currentl diarrhea better w IMmodium.  Appetite still depressed but no NV  Now transferred off CCM and onto med-surg ward.  Tolerating HH diet though appetite still depressed.  Diarrhea better with Imodium.  Ultrasound abdomen Demonstrated changes of prior cholecystectomy, diffusely echogenic liver consistent with diffuse fatty changes, normal ducts.  Unable to identify CBD.  Portal vein Dopplers normal. CTAP wo contrast:  changes of R n Y.  Previous cholecystectomy severe fatty liver.  2 cm structure at medial left lobe of liver with similar attenuation of as little liver parenchyma, likely represents hepatic tissue.  Mild pancreatic haziness at uncinate, artifact less likely mild pancreatitis.  Aortic atherosclerosis.  Admits to drinking 4 shots of alcohol daily.  Goes thru a pint of Vodka every 2 to 3 days.  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    She is based out of [place].  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   She lives with her brother. Family history of renal cell carcinoma in her mother.  Aunt with breast cancer, aunt with pancreatic cancer, uncle with lung cancer.    Past Medical History:  Diagnosis Date   Acid reflux    Anemia    Ulcer     Past Surgical History:  Procedure Laterality Date   ABDOMINAL SURGERY     abdominoplasty   BREAST BIOPSY Right 2013   CHOLECYSTECTOMY N/A 11/23/2019   Procedure: LAPAROSCOPIC CHOLECYSTECTOMY WITH INTRAOPERATIVE CHOLANGIOGRAM;  Surgeon: Kinsinger, Arta Bruce, MD;  Location: [facility];  Service: General;   Laterality: N/A;   COSMETIC SURGERY     tummy tuck   HERNIA REPAIR     ROUX-EN-Y PROCEDURE     TUMOR REMOVAL     WRIST GANGLION EXCISION      Prior to Admission medications   Medication Sig Start Date End Date Taking? Authorizing Provider  busPIRone (BUSPAR) 15 MG tablet Take 15 mg by mouth 3 (three) times daily as needed (anxiety). 06/09/21  Yes [provider]  Cyanocobalamin (B-12 PO) Take 1 tablet by mouth daily.   Yes [provider]  sertraline (ZOLOFT) 100 MG tablet Take 100 mg by mouth as needed. 06/15/21  Yes [provider]  folic acid (FOLVITE) 1 MG tablet Take 1 tablet (1 mg total) by mouth daily. Patient not taking: Reported on 04/11/2022 01/20/22   Dede Query T, PA-C  gabapentin (NEURONTIN) 300 MG capsule Take 1 capsule (300 mg total) by mouth 3 (three) times daily. Patient not taking: Reported on 04/11/2022 10/04/20   Raylene Everts, MD  valsartan (DIOVAN) 80 MG tablet Take 80 mg by mouth daily. Patient not taking: Reported on 04/11/2022 06/27/21   [provider]  pantoprazole (PROTONIX) 20 MG tablet Take 1 tablet (20 mg total) by mouth daily for 14 days. 08/05/18 10/04/20  Couture, Cortni S, PA-C    Scheduled Meds:  amoxicillin-clavulanate  1 tablet Oral Q12H   Chlorhexidine Gluconate Cloth  6 each Topical Daily   dextromethorphan  30 mg Oral BID   famotidine  20 mg Oral Daily   folic acid  2 mg Oral Daily   Gerhardt's butt cream   Topical BID   insulin aspart  0-15 Units Subcutaneous TID WC   insulin aspart  0-5 Units Subcutaneous QHS   prednisoLONE  40 mg Oral Daily   sodium chloride flush  10-40 mL Intracatheter Q12H   Infusions:  promethazine (PHENERGAN) injection (IM or IVPB) 6.25 mg (04/17/22 1031)   PRN Meds: albuterol, docusate sodium, guaiFENesin-dextromethorphan, loperamide, ondansetron (ZOFRAN) IV, mouth rinse, polyethylene glycol, promethazine (PHENERGAN) injection (IM or IVPB), sodium chloride, sodium chloride  flush   Allergies as of 04/09/2022 - Review Complete 04/09/2022  Allergen Reaction Noted   Other Rash 08/15/2012    Family History  Problem Relation Age of Onset   Cancer Mother        kidney   Cancer Paternal Aunt    Breast cancer Maternal Aunt    Pancreatic cancer Maternal Aunt    Lung cancer Paternal Uncle     Social History   Socioeconomic History   Marital status: Single    Spouse name: Not on file   Number of children: 1   Years of education: Not on file   Highest education level: Not on file  Occupational History  Employer: [employer]    Comment: [employer]  Tobacco Use   Smoking status: Every Day    Packs/day: 0.25    Years: 25.00    Total pack years: 6.25    Types: Cigarettes   Smokeless tobacco: Never  Vaping Use   Vaping Use: Never used  Substance and Sexual Activity   Alcohol use: Not Currently    Alcohol/week: 12.0 standard drinks of alcohol    Types: 12 Glasses of wine per week   Drug use: No   Sexual activity: Not on file  Other Topics Concern   Not on file  Social History Narrative   Not on file   Social Determinants of Health   Financial Resource Strain: Not on file  Food Insecurity: Not on file  Transportation Needs: Not on file  Physical Activity: Not on file  Stress: Not on file  Social Connections: Not on file  Intimate Partner Violence: Not on file    REVIEW OF SYSTEMS: Constitutional: Fatigue persists but improving. ENT:  No nose bleeds Pulm: Shortness of breath improving.  No cough CV:  No palpitations, no LE edema.  GU:  No hematuria, no frequency GI: See HPI. Heme: See HPI. Transfusions: See HPI Neuro:  No headaches, no peripheral tingling or numbness.  No seizures Derm:  No itching, no rash or sores.  Endocrine:  No sweats or chills.  No polyuria or dysuria Immunization: Reviewed Travel: Travels pretty regularly to the [place] of [place]: Vital signs in last 24 hours: Vitals:   04/17/22 0300  04/17/22 0758  BP: 101/60 (!) 104/56  Pulse: 92 93  Resp: 18 18  Temp: 98.4 F (36.9 C) 98.2 F (36.8 C)  SpO2: 90% 95%   Wt Readings from Last 3 Encounters:  04/17/22 103.7 kg  01/18/22 99.2 kg  09/30/21 104.5 kg    General: Obese, somewhat pale but does not look acutely ill. Head: No facial asymmetry or swelling.  No signs of head trauma. Eyes: Sclera are icteric.  Conjunctiva pink. Ears: No hearing deficit Nose: No congestion or discharge Mouth: Moist, clear, pink oral mucosa.  Tongue midline Neck: No JVD, no masses, no thyromegaly Lungs: Clear without labored breathing Heart: RRR.  Soft systolic murmur.  S1, S2 present Abdomen: Obese, soft.  No tenderness.  All surgical incisions/scars are well-healed..   Rectal: Deferred Musc/Skeltl: No joint redness, swelling or gross deformity Extremities: Slight, nonpitting lower extremity swelling Neurologic: Oriented x3.  Good historian.  Fluid speech.  Moves all 4 limbs without gross deficit, strength not tested Skin: No rash, no sores, no suspicious lesions. Nodes: No cervical adenopathy Psych: Calm, cooperative.  Intake/Output from previous day: No intake/output data recorded. Intake/Output this shift: Total I/O In: 220 [P.O.:220] Out: -   LAB RESULTS: Recent Labs    04/15/22 0542 04/16/22 0102 04/17/22 0822  WBC  --  13.2* 12.5*  HGB 9.9* 8.2* 8.0*  HCT 29.0* 23.4* 23.0*  PLT  --  47* 50*   BMET Lab Results  Component Value Date   NA 139 04/17/2022   NA 138 04/16/2022   NA 136 04/15/2022   K 3.7 04/17/2022   K 3.5 04/16/2022   K 2.8 (L) 04/15/2022   CL 107 04/17/2022   CL 106 04/16/2022   CL 102 04/15/2022   CO2 24 04/17/2022   CO2 21 (L) 04/16/2022   CO2 22 04/15/2022   GLUCOSE 124 (H) 04/17/2022   GLUCOSE 142 (H) 04/16/2022   GLUCOSE  148 (H) 04/15/2022   BUN 45 (H) 04/17/2022   BUN 40 (H) 04/16/2022   BUN 43 (H) 04/15/2022   CREATININE 1.97 (H) 04/17/2022   CREATININE 2.10 (H) 04/16/2022    CREATININE 2.25 (H) 04/15/2022   CALCIUM 7.9 (L) 04/17/2022   CALCIUM 7.4 (L) 04/16/2022   CALCIUM 7.1 (L) 04/15/2022   LFT Recent Labs    04/15/22 0534 04/15/22 0700 04/16/22 0102 04/17/22 0822  PROT 6.6  --  6.4* 6.8  ALBUMIN 2.5* 2.3* 2.4* 2.3*  AST 114*  --  99* 131*  ALT 57*  --  63* 81*  ALKPHOS 88  --  91 95  BILITOT 11.1*  --  9.4* 9.5*  BILIDIR 5.6*  --   --   --   IBILI 5.5*  --   --   --    PT/INR Lab Results  Component Value Date   INR 1.7 (H) 04/17/2022   INR 1.7 (H) 04/11/2022   INR 1.8 (H) 04/10/2022   Hepatitis Panel No results for input(s): "HEPBSAG", "HCVAB", "HEPAIGM", "HEPBIGM" in the last 72 hours. C-Diff No components found for: "CDIFF" Lipase     Component Value Date/Time   LIPASE 103 (H) 04/11/2022 0205    Drugs of Abuse  No results found for: "LABOPIA", "COCAINSCRNUR", "LABBENZ", "AMPHETMU", "THCU", "LABBARB"   RADIOLOGY STUDIES: No results found.    IMPRESSION:   Acute on chronic anemia.  History of IDA treated with parenteral iron in past.  History of B12 deficiency, prior monthly injections.  Folate deficient.  FOBT test negative on 7/17 and 7/20.  Single nosebleed once last week, no other obvious sources for blood loss. S/p 3 PRBCs.    Thrombocytopenia.  No splenomegaly per ultrasound or CT imaging.  Fatty liver with recurrent  alcoholic hepatitis.  No imaging thus far confirms a diagnosis of cirrhosis.  Acute hepatitis serologies are all negative.  Drinks significant amounts of alcohol daily.  Day 7 Prednisolone.  All LFTs w exception of ALT are improving.    Acute pancreatitis.  Suggested by CT and lipase into the mid 200s, improving.  Diarrhea, improved, not yet resolved.  No evidence for colitis based on noncontrast CT.  No prior colonoscopy.  Stool path panel negative, but does not include C diff.    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    Aspiration PNA.  On unasyn which could be delaying  resolution of diarrhea.    Roux-en-Y gastric bypass 2010, revision 2014.  Repair paraesophageal hernia 2014    PLAN:       Per Dr Lyndel Safe.      Ordered stool testing for C diff.     Azucena Freed  04/17/2022, 11:26 AM Phone [phone]   Attending physician's note   I have taken history, reviewed the chart and examined the patient. I performed a substantive portion of this encounter, including complete performance of at least one of the key components, in conjunction with the APP. I agree with the Advanced Practitioner's note, impression and recommendations.   Acute on chronic anemia- Heme neg stools.  Multifactorial-IDA d/t Roux-en-Y gastric bypass, BM suppression d/t ETOH, B12/folate deficiency. No active GI bleeding. Has longstanding recurrent anastomotic ulcers on prev EGDs (last EGD 2020)  Acute EtOH hepatitis on prednisolone (day 7) _0 . No cirrhosis (or ascites) on imaging but does have low platelet count. Alb 2.5, TB 11.  Improving gradually.  Hence, recommended 28-day course.  H/O ETOH pancreatitis.  Adm d/t aspiration pneumonia on  Unasyn/. H/O diarrhea  Plan: -EGD in AM. -She would like to have rectal exam while under sedation in AM -IV Protonix -Stool for C. Difficile -If needed, colon as outpt -Trend CBC, LFTs, INR. -Strictly stop all alcohol.  She appears to be willing.  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   Carmell Austria, MD [facility] [phone]

## 2022-04-17 NOTE — Progress Notes (Signed)
Speech Language Pathology Treatment: Dysphagia  Patient Details Name: [name] MRN: [id number] DOB: 10/07/1975 Today's Date: 04/17/2022 Time: 1145-1153 SLP Time Calculation (min) (ACUTE ONLY): 8 min  Assessment / Plan / Recommendation Clinical Impression  Brief follow up for dysphagia. She continues to have a chronic cough and denies swallow difficulty. She was not hungry and stated if she tries to make herself eat when she's not hungry she "may throw up" however agreeable to a bite of pizza and sip water. Majority of session spent reiterating and educating re: esophageal precautions with clinical reasoning. Pt voiced understanding. Continue regular/thin liquids. No further ST needed.    HPI HPI: 45-year-old female current every day smoker with past medical history of Acid reflux, Anemia, Ulcer, HTN who presents to the ED 04/10/2022 with fatigue and dizziness.  She is nontoxic-appearing.  Initial vital signs are notable for blood pressure of 90/43.  Patient reports a history of hypertension but has not been taking her medications regularly due to insurance issues; 04/13/22 CXR indicated New patchy bilateral airspace opacity. This is new from exam 3 days  ago. Differential considerations include pneumonia (including  aspiration) or less likely pulmonary edema; BSE generated.      SLP Plan  All goals met;Discharge SLP treatment due to (comment)      Recommendations for follow up therapy are one component of a multi-disciplinary discharge planning process, led by the attending physician.  Recommendations may be updated based on patient status, additional functional criteria and insurance authorization.    Recommendations  Diet recommendations: Regular;Thin liquid Liquids provided via: Cup;Straw Medication Administration: Whole meds with liquid Supervision: Patient able to self feed Postural Changes and/or Swallow Maneuvers: Upright 30-60 min after meal;Seated upright 90 degrees                 Oral Care Recommendations: Oral care BID Follow Up Recommendations: No SLP follow up Assistance recommended at discharge: None SLP Visit Diagnosis: Dysphagia, unspecified (R13.10) Plan: All goals met;Discharge SLP treatment due to (comment)           Litaker, Lisa Willis  04/17/2022, 12:44 PM 

## 2022-04-17 NOTE — Progress Notes (Signed)
Mobility Specialist Criteria Algorithm Info.   04/17/22 1645  Mobility  Activity Ambulated with assistance in hallway;Transferred from bed to chair (to chair after ambulation)  Range of Motion/Exercises Active;All extremities  Level of Assistance Standby assist, set-up cues, supervision of patient - no hands on  Assistive Device None  Distance Ambulated (ft) 260 ft  Activity Response Tolerated well   Patient received in spine agreeable to participate in mobility. Ambulated supervision level with slow steady gait. Required 2LO2 to maintain an SpO2 >96%.  Returned to room without complaint or incident. Upon returning to room completed education on pursed lip breathing and incentive spirometer. Was left in recliner chair with all needs met, call bell in reach.   04/17/2022 5:18 PM  Martinique Corry Ihnen, [place], [place]  SUPJS:[phone] Office: [phone]

## 2022-04-17 NOTE — Progress Notes (Signed)
[name] is now on 6 N.  She actually looks quite good.  She still sounds a little congested.  Her main complaint that she has been having bright red blood per rectum.  She is not sure if this is hemorrhoids.  As per her she can always have varices.  This may need to be looked into.  No lab work back yet today.  Yesterday, her platelet count was 47,000.  Her hemoglobin had dropped from 9.9-8.2.  Again, she may need to have an endoscopy to see what might be going on in the lower intestine.  Her renal function and hepatic function continue to improve.  Yesterday, her BUN was 40 and creatinine 2.1.  Her bilirubin was 9.4.  SGPT 63 SGOT 99.  Her platelet count was down yesterday.  This could certainly be part of "pneumonia" that she had.  She does have a cough.  It is little bit productive.  She has had no abdominal pain.  She is really not eating all that much.  She has had no fever.  This nice to see that her renal function and hepatic function are improving.  I suspect that her hematologic parameters will take a while before they improve.  If she does have cirrhosis, I am not sure that her platelet count will ever "normalize."  Her vital signs show temperature of 98.4.  Pulse 92.  Blood pressure 101/60.  Oxygen saturation room air is 90%.  Her lungs do sound a little congested over on the left side.  She does have decent air movement bilaterally.  Cardiac exam regular rate and rhythm.  Abdomen is soft.  She has no guarding or rebound tenderness.  There is no fluid wave.  There is no obvious hepatomegaly or splenomegaly.  Extremities shows no clubbing, cyanosis or edema.  Neurological exam is nonfocal.  I am happy that [name] is improving with her hepatic and renal function.  Again, she is not drinking.  I think this will certainly be the determinant down the road as to her hepatic function.  She is having the bright red blood per rectum.  This is her biggest concern.  We will have to see what  her labs look like today.  She did get some Procrit last week.  We can was given another dose of Procrit if necessary.  I know that she will get incredible care from all the staff up on 6 N.  Christin Bach, MD  Ivin Booty 1:9

## 2022-04-17 NOTE — Progress Notes (Cosign Needed Addendum)
NAME:  [name], MRN:  [id number], DOB:  1976-04-08, LOS: 7 ADMISSION DATE:  04/09/2022, CONSULTATION DATE:  04/10/2022 REFERRING MD:  [facility] - EDP, CHIEF COMPLAINT:  Acute renal failure, anemia, thrombocytopenia   History of Present Illness:  [name] is a 46 y.o. F with PMHx significant for tobacco use disorder, GERD, anemia, bariatric surgery complicated by ulcers, HTN who presented to [facility] ED 04/10/2022 complaining of fatigue and dizziness. She was hypotensive to 90/43 on exam, EKG did not show arrhythmia or sign of ischemia.   Initial labs remarkable for K 1.9, creatinine 6.38, Hgb 7.0>6.0, platelets 118>82, BUN/Cr 72/6.54. No signs of active bleeding on exam. CT abdomen obtained showed severe fatty liver, artifact versus mild acute pancreatitis but no bowel obstruction. She received total of 2L IVF.   Denies any new meds or significant NSAID use.  She denies significant dehydration. Patient reports history of renal cell carcinoma in her family. This is concerning for her. She has history of EtOH use with consumption of 4 shots of alcohol per day. The kidneys, visualized ureters, and the urinary bladder appear unremarkable.  She is unable to provide a urine sample.  She does state that she is urinating at home.  Second liter of fluids ordered in the ED.  Nephrology has been consulted.Hematology has been consulted (Dr. Myna Hidalgo).   At time of admission, labs remarkable for Na 133, K 2.1, Cl 99, CO2 19, BUN 72, creatinine 6.54, calcium 8.7, anion gap 15, LDH 217, albumin 3.0, ak phos 105, lipase 267, AST 130, ALT 39, direct bili 5.5, total bili 10.2, CK 23, WBC 9.6, Hgb 6.0, platelets 82, vitamin B 12 1136, iron 181, saturation ratios 48, serum folate 3.2, ferritin 73, methylmalonic acid 193, GI Panel + for Norovirus but asymptomatic.   PCCM consulted for ICU admission and management.  Pertinent Medical History:  Tobacco use disorder GERD Anemia Bariatric  surgery HTN EtOH abuse  Significant Hospital Events: Including procedures, antibiotic start and stop dates in addition to other pertinent events   07/17 Admitted to ICU 07/17 Trialysis catheter placed 7/21 respiratory status did worsen, required BiPAP 7/22 no overnight events 7/23 Transferred out of ICU. 7/24 O2 requirement minimal, still with significant volume overload. No UOP charted, suspect I&Os inaccurate though renal function improving (Cr 1.97 from 2.10).  Interim History / Subjective:  No significant events overnight Feeling better overall Hemodynamically stable Breathing/resp status improved, on 2LNC (satting 90% on RA) Significant LE edema on exam Cr plateaued/slightly better, 1.97 (2.10) No UOP charted  Objective:  Blood pressure (!) 104/56, pulse 93, temperature 98.2 F (36.8 C), temperature source Oral, resp. rate 18, height 5\' 6"  (1.676 m), weight 103.7 kg, last menstrual period 03/10/2022, SpO2 95 %.        Intake/Output Summary (Last 24 hours) at 04/17/2022 1109 Last data filed at 04/17/2022 04/19/2022 Gross per 24 hour  Intake 220 ml  Output --  Net 220 ml    Filed Weights   04/11/22 0400 04/13/22 0500 04/17/22 0343  Weight: 93 kg 99.1 kg 103.7 kg   Physical Examination: General: Acutely ill-appearing middle-aged woman in NAD. Pleasant and conversant. HEENT: [place]/AT, +mild scleral iclerus, PERRL, moist mucous membranes. Neuro: Awake, oriented x 4. Responds to verbal stimuli. Following commands consistently. Moves all 4 extremities spontaneously. CV: RRR, no m/g/r. PULM: Breathing even and unlabored on 2LNC. Lung fields CTAB, faint bibasilar crackles. GI: Soft, nontender, mildly distended. Normoactive bowel sounds. Extremities: Bilateral symmetric 2+ pitting LE edema  noted. Skin: Warm/dry, no rashes.  Resolved Hospital Problem List:   Hyponatremia Elevated LDH Hypovolemic hock Metabolic acidosis Elevated LDH  Assessment & Plan:   Acute hypoxemic  respiratory failure Possible aspiration pneumonia Concern for fluid overload - Continue supplemental O2 support as needed to maintain SpO2 > 90% - Diuresis as renal function tolerates, would benefit from Lasix today 7/24 if making urine - Pulmonary hygiene - Continue Augmentin for possible aspiration PNA  Acute-on-chronic anemia Thrombocytopenia Hematochezia No schistocytes on smear. Thrombocytopenia likely r/t marrow suppression from EtOH use/liver disease. - Appreciate Hematology involvement - Hgb with slight drift, 8.0 (8.2) - Consider additional Procrit per Heme - Low threshold for GI consult if continued BRBPR  Acute gastroenteritis secondary to norovirus - Diarrhea improving - Enteric precautions for Noro - Imodium PRN  Acute kidney injury Hepatorenal syndrome - Appreciate Nephro recs (signed off 7/22) - Renal function improving with Cr plateaued/slightly improved 1.97 (2.10) - Unclear UOP, given no urine charted - May benefit from gentle diuresis  Acute alcoholic hepatitis Fatty liver disease - Continue prednisone 40mg  daily - Repeat Lilly score 7/26, reassess need for 28-day pred course vs. Discontinuation - Monitor coags  History of tobacco abuse History of alcohol abuse - Encourage cessation - TOC for counseling resources, if patient is open to this  PCCM will sign off at this time. Thank you for involving 8/26 in this patient's care. Please do not hesitate to reach out if PCCM can be of further assistance.   Best Practice: (right click and "Reselect all SmartList Selections" daily)   Diet/type: Regular consistency (see orders) DVT prophylaxis: SCD GI prophylaxis: H2B Lines: N/A Foley:  N/A Code Status:  full code Last date of multidisciplinary goals of care discussion [07/17]  Critical care time: N/A   [facility] 04/17/22 11:15 AM  Please see [email or web address] for pager details.  From 7A-7P if no response,  please call [phone] After hours, please call ELink [phone]

## 2022-04-17 NOTE — H&P (View-Only) (Signed)
[facility] Consult: 11:26 AM 04/17/2022  LOS: 7 days    Referring Provider: Dr Georgena Spurling  Primary Care Physician:  Gary.   Primary Gastroenterologist:  unassigned. [facility]. Michell Heinrich PA, Donnelly Angelica MD.       Reason for Consultation:  anemia.     HPI: [name] is a 46 y.o. female.  PMH Obesity.  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  2021 lap chole.   Panniculitis, status post panniculectomy.  Internal hernia repair 09/2010.  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  Iron deficiency anemia.  Vitamin B1,  Vit  B 12, vitamin D deficient.  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  Last IV iron infusion 06/2021. No prior PRBCs EtOH hepatitis in 2021, 2022. Care everywhere records indicate chronic marginal/anastomotic ulcers w multiple previous EGDs.  Twice 2011 (multiple small ulcers at jejunal side of pouch with normal gastric pouch.  Twice in 2012 (anastomotic ulcer), once 2013 (anastomotic ulcer 15 mm sized), twice 2014, once 2017 (report not found) . Latest EGD was 09/2018 (only path reprt found: Gastric mucosa with no diagnostic abnormality, no H. Pylori) . Several upper GI studies, the last in 2014 and did not demonstrate leak or stricture.  Hgb of 08/2018 12.9.  MCV 116.   Hgb 6 ... PRBCx 3 ... 9.9... 8.  MCV 113. Iron 65.  Iron sats 55%.  Ferritin 834.  Folate 3.2.  B12 1136 Platelets 50, previously 166 and 190 a few months back. GFR 8 ..  32.  BUN/Creat 72/6.3..  45/1.9 T bili 10.2..  13.2..  9.5.  Alk phos 110..  95.  AST/ALT 144/46..131/81.  Lipase 267... 103.  Pt admitted over 1 week ago.  Reported 1 week diarrhea, weakness, jaundice. Hypovolemic.  Stool path panel negative.  Abnormal LFTs  w ETOH hepatitis, started Prednisolone 7/19.  AKI, anemia requiring 2 PRBCs, severe hypokalemia. Acute blood loss versus hemolytic anemia with thrombocytopenia. Dr. Marin Olp has seen her regarding anemia, thrombocytopenia MD reports pt has bleeding from skin but no overt GI bleeding.  Diarrhea, loose brown to watery stools occasionally.   Currentl diarrhea better w IMmodium.  Appetite still depressed but no NV  Now transferred off CCM and onto med-surg ward.  Tolerating HH diet though appetite still depressed.  Diarrhea better with Imodium.  Ultrasound abdomen Demonstrated changes of prior cholecystectomy, diffusely echogenic liver consistent with diffuse fatty changes, normal ducts.  Unable to identify CBD.  Portal vein Dopplers normal. CTAP wo contrast:  changes of R n Y.  Previous cholecystectomy severe fatty liver.  2 cm structure at medial left lobe of liver with similar attenuation of as little liver parenchyma, likely represents hepatic tissue.  Mild pancreatic haziness at uncinate, artifact less likely mild pancreatitis.  Aortic atherosclerosis.  Admits to drinking 4 shots of alcohol daily.  Goes thru a pint of Vodka every 2 to 3 days.  Currently works as an Chief Executive Officer for Teacher, English as a foreign language, her territory is [place].  She is based out of [place].  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   She lives with her brother. Family history of renal cell carcinoma in her mother.  Aunt with breast cancer, aunt with pancreatic cancer, uncle with lung cancer.    Past Medical History:  Diagnosis Date   Acid reflux    Anemia    Ulcer     Past Surgical History:  Procedure Laterality Date   ABDOMINAL SURGERY     abdominoplasty   BREAST BIOPSY Right 2013   CHOLECYSTECTOMY N/A 11/23/2019   Procedure: LAPAROSCOPIC CHOLECYSTECTOMY WITH INTRAOPERATIVE CHOLANGIOGRAM;  Surgeon: Kinsinger, Arta Bruce, MD;  Location: [facility];  Service: General;   Laterality: N/A;   COSMETIC SURGERY     tummy tuck   HERNIA REPAIR     ROUX-EN-Y PROCEDURE     TUMOR REMOVAL     WRIST GANGLION EXCISION      Prior to Admission medications   Medication Sig Start Date End Date Taking? Authorizing Provider  busPIRone (BUSPAR) 15 MG tablet Take 15 mg by mouth 3 (three) times daily as needed (anxiety). 06/09/21  Yes [provider]  Cyanocobalamin (B-12 PO) Take 1 tablet by mouth daily.   Yes [provider]  sertraline (ZOLOFT) 100 MG tablet Take 100 mg by mouth as needed. 06/15/21  Yes [provider]  folic acid (FOLVITE) 1 MG tablet Take 1 tablet (1 mg total) by mouth daily. Patient not taking: Reported on 04/11/2022 01/20/22   Dede Query T, PA-C  gabapentin (NEURONTIN) 300 MG capsule Take 1 capsule (300 mg total) by mouth 3 (three) times daily. Patient not taking: Reported on 04/11/2022 10/04/20   Raylene Everts, MD  valsartan (DIOVAN) 80 MG tablet Take 80 mg by mouth daily. Patient not taking: Reported on 04/11/2022 06/27/21   [provider]  pantoprazole (PROTONIX) 20 MG tablet Take 1 tablet (20 mg total) by mouth daily for 14 days. 08/05/18 10/04/20  Couture, Cortni S, PA-C    Scheduled Meds:  amoxicillin-clavulanate  1 tablet Oral Q12H   Chlorhexidine Gluconate Cloth  6 each Topical Daily   dextromethorphan  30 mg Oral BID   famotidine  20 mg Oral Daily   folic acid  2 mg Oral Daily   Gerhardt's butt cream   Topical BID   insulin aspart  0-15 Units Subcutaneous TID WC   insulin aspart  0-5 Units Subcutaneous QHS   prednisoLONE  40 mg Oral Daily   sodium chloride flush  10-40 mL Intracatheter Q12H   Infusions:  promethazine (PHENERGAN) injection (IM or IVPB) 6.25 mg (04/17/22 1031)   PRN Meds: albuterol, docusate sodium, guaiFENesin-dextromethorphan, loperamide, ondansetron (ZOFRAN) IV, mouth rinse, polyethylene glycol, promethazine (PHENERGAN) injection (IM or IVPB), sodium chloride, sodium chloride  flush   Allergies as of 04/09/2022 - Review Complete 04/09/2022  Allergen Reaction Noted   Other Rash 08/15/2012    Family History  Problem Relation Age of Onset   Cancer Mother        kidney   Cancer Paternal Aunt    Breast cancer Maternal Aunt    Pancreatic cancer Maternal Aunt    Lung cancer Paternal Uncle     Social History   Socioeconomic History   Marital status: Single    Spouse name: Not on file   Number of children: 1   Years of education: Not on file   Highest education level: Not on file  Occupational History  Employer: [employer]    Comment: [employer]  Tobacco Use   Smoking status: Every Day    Packs/day: 0.25    Years: 25.00    Total pack years: 6.25    Types: Cigarettes   Smokeless tobacco: Never  Vaping Use   Vaping Use: Never used  Substance and Sexual Activity   Alcohol use: Not Currently    Alcohol/week: 12.0 standard drinks of alcohol    Types: 12 Glasses of wine per week   Drug use: No   Sexual activity: Not on file  Other Topics Concern   Not on file  Social History Narrative   Not on file   Social Determinants of Health   Financial Resource Strain: Not on file  Food Insecurity: Not on file  Transportation Needs: Not on file  Physical Activity: Not on file  Stress: Not on file  Social Connections: Not on file  Intimate Partner Violence: Not on file    REVIEW OF SYSTEMS: Constitutional: Fatigue persists but improving. ENT:  No nose bleeds Pulm: Shortness of breath improving.  No cough CV:  No palpitations, no LE edema.  GU:  No hematuria, no frequency GI: See HPI. Heme: See HPI. Transfusions: See HPI Neuro:  No headaches, no peripheral tingling or numbness.  No seizures Derm:  No itching, no rash or sores.  Endocrine:  No sweats or chills.  No polyuria or dysuria Immunization: Reviewed Travel: Travels pretty regularly to the [place] part of [place]: Vital signs in last 24 hours: Vitals:   04/17/22 0300  04/17/22 0758  BP: 101/60 (!) 104/56  Pulse: 92 93  Resp: 18 18  Temp: 98.4 F (36.9 C) 98.2 F (36.8 C)  SpO2: 90% 95%   Wt Readings from Last 3 Encounters:  04/17/22 103.7 kg  01/18/22 99.2 kg  09/30/21 104.5 kg    General: Obese, somewhat pale but does not look acutely ill. Head: No facial asymmetry or swelling.  No signs of head trauma. Eyes: Sclera are icteric.  Conjunctiva pink. Ears: No hearing deficit Nose: No congestion or discharge Mouth: Moist, clear, pink oral mucosa.  Tongue midline Neck: No JVD, no masses, no thyromegaly Lungs: Clear without labored breathing Heart: RRR.  Soft systolic murmur.  S1, S2 present Abdomen: Obese, soft.  No tenderness.  All surgical incisions/scars are well-healed..   Rectal: Deferred Musc/Skeltl: No joint redness, swelling or gross deformity Extremities: Slight, nonpitting lower extremity swelling Neurologic: Oriented x3.  Good historian.  Fluid speech.  Moves all 4 limbs without gross deficit, strength not tested Skin: No rash, no sores, no suspicious lesions. Nodes: No cervical adenopathy Psych: Calm, cooperative.  Intake/Output from previous day: No intake/output data recorded. Intake/Output this shift: Total I/O In: 220 [P.O.:220] Out: -   LAB RESULTS: Recent Labs    04/15/22 0542 04/16/22 0102 04/17/22 0822  WBC  --  13.2* 12.5*  HGB 9.9* 8.2* 8.0*  HCT 29.0* 23.4* 23.0*  PLT  --  47* 50*   BMET Lab Results  Component Value Date   NA 139 04/17/2022   NA 138 04/16/2022   NA 136 04/15/2022   K 3.7 04/17/2022   K 3.5 04/16/2022   K 2.8 (L) 04/15/2022   CL 107 04/17/2022   CL 106 04/16/2022   CL 102 04/15/2022   CO2 24 04/17/2022   CO2 21 (L) 04/16/2022   CO2 22 04/15/2022   GLUCOSE 124 (H) 04/17/2022   GLUCOSE 142 (H) 04/16/2022   GLUCOSE  148 (H) 04/15/2022   BUN 45 (H) 04/17/2022   BUN 40 (H) 04/16/2022   BUN 43 (H) 04/15/2022   CREATININE 1.97 (H) 04/17/2022   CREATININE 2.10 (H) 04/16/2022    CREATININE 2.25 (H) 04/15/2022   CALCIUM 7.9 (L) 04/17/2022   CALCIUM 7.4 (L) 04/16/2022   CALCIUM 7.1 (L) 04/15/2022   LFT Recent Labs    04/15/22 0534 04/15/22 0700 04/16/22 0102 04/17/22 0822  PROT 6.6  --  6.4* 6.8  ALBUMIN 2.5* 2.3* 2.4* 2.3*  AST 114*  --  99* 131*  ALT 57*  --  63* 81*  ALKPHOS 88  --  91 95  BILITOT 11.1*  --  9.4* 9.5*  BILIDIR 5.6*  --   --   --   IBILI 5.5*  --   --   --    PT/INR Lab Results  Component Value Date   INR 1.7 (H) 04/17/2022   INR 1.7 (H) 04/11/2022   INR 1.8 (H) 04/10/2022   Hepatitis Panel No results for input(s): "HEPBSAG", "HCVAB", "HEPAIGM", "HEPBIGM" in the last 72 hours. C-Diff No components found for: "CDIFF" Lipase     Component Value Date/Time   LIPASE 103 (H) 04/11/2022 0205    Drugs of Abuse  No results found for: "LABOPIA", "COCAINSCRNUR", "LABBENZ", "AMPHETMU", "THCU", "LABBARB"   RADIOLOGY STUDIES: No results found.    IMPRESSION:   Acute on chronic anemia.  History of IDA treated with parenteral iron in past.  History of B12 deficiency, prior monthly injections.  Folate deficient.  FOBT test negative on 7/17 and 7/20.  Single nosebleed once last week, no other obvious sources for blood loss. S/p 3 PRBCs.    Thrombocytopenia.  No splenomegaly per ultrasound or CT imaging.  Fatty liver with recurrent  alcoholic hepatitis.  No imaging thus far confirms a diagnosis of cirrhosis.  Acute hepatitis serologies are all negative.  Drinks significant amounts of alcohol daily.  Day 7 Prednisolone.  All LFTs w exception of ALT are improving.    Acute pancreatitis.  Suggested by CT and lipase into the mid 200s, improving.  Diarrhea, improved, not yet resolved.  No evidence for colitis based on noncontrast CT.  No prior colonoscopy.  Stool path panel negative, but does not include C diff.    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    Aspiration PNA.  On unasyn which could be delaying  resolution of diarrhea.    Roux-en-Y gastric bypass 2010, revision 2014.  Repair paraesophageal hernia 2014    PLAN:       Per Dr Lyndel Safe.      Ordered stool testing for C diff.     Azucena Freed  04/17/2022, 11:26 AM Phone [phone]   Attending physician's note   I have taken history, reviewed the chart and examined the patient. I performed a substantive portion of this encounter, including complete performance of at least one of the key components, in conjunction with the APP. I agree with the Advanced Practitioner's note, impression and recommendations.   Acute on chronic anemia- Heme neg stools.  Multifactorial-IDA d/t Roux-en-Y gastric bypass, BM suppression d/t ETOH, B12/folate deficiency. No active GI bleeding. Has longstanding recurrent anastomotic ulcers on prev EGDs (last EGD 2020)  Acute EtOH hepatitis on prednisolone (day 7) _0 . No cirrhosis (or ascites) on imaging but does have low platelet count. Alb 2.5, TB 11.  Improving gradually.  Hence, recommended 28-day course.  H/O ETOH pancreatitis.  Adm d/t aspiration pneumonia on  Unasyn/. H/O diarrhea  Plan: -EGD in AM. -She would like to have rectal exam while under sedation in AM -IV Protonix -Stool for C. Difficile -If needed, colon as outpt -Trend CBC, LFTs, INR. -Strictly stop all alcohol.  She appears to be willing.  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   Carmell Austria, MD [facility] [phone]

## 2022-04-17 NOTE — Plan of Care (Signed)

## 2022-04-18 ENCOUNTER — Inpatient Hospital Stay (HOSPITAL_COMMUNITY): Payer: Self-pay | Admitting: Certified Registered Nurse Anesthetist

## 2022-04-18 ENCOUNTER — Encounter (HOSPITAL_COMMUNITY): Payer: Self-pay | Admitting: Internal Medicine

## 2022-04-18 ENCOUNTER — Encounter (HOSPITAL_COMMUNITY)
Admission: EM | Disposition: A | Discharge status: Home Health | Payer: Self-pay | Source: Home / Self Care | Attending: Family Medicine

## 2022-04-18 DIAGNOSIS — D500 Iron deficiency anemia secondary to blood loss (chronic): Secondary | ICD-10-CM

## 2022-04-18 DIAGNOSIS — N179 Acute kidney failure, unspecified: Secondary | ICD-10-CM

## 2022-04-18 DIAGNOSIS — K449 Diaphragmatic hernia without obstruction or gangrene: Secondary | ICD-10-CM

## 2022-04-18 DIAGNOSIS — R1013 Epigastric pain: Secondary | ICD-10-CM

## 2022-04-18 DIAGNOSIS — K229 Disease of esophagus, unspecified: Secondary | ICD-10-CM

## 2022-04-18 DIAGNOSIS — N189 Chronic kidney disease, unspecified: Secondary | ICD-10-CM

## 2022-04-18 DIAGNOSIS — K289 Gastrojejunal ulcer, unspecified as acute or chronic, without hemorrhage or perforation: Secondary | ICD-10-CM

## 2022-04-18 HISTORY — PX: BIOPSY: SHX5522

## 2022-04-18 HISTORY — PX: ESOPHAGOGASTRODUODENOSCOPY (EGD) WITH PROPOFOL: SHX5813

## 2022-04-18 LAB — CBC WITH DIFFERENTIAL/PLATELET
Abs Immature Granulocytes: 0.22 10*3/uL — ABNORMAL HIGH (ref 0.00–0.07)
Basophils Absolute: 0 10*3/uL (ref 0.0–0.1)
Basophils Relative: 0 %
Eosinophils Absolute: 0 10*3/uL (ref 0.0–0.5)
Eosinophils Relative: 0 %
HCT: 24.5 % — ABNORMAL LOW (ref 36.0–46.0)
Hemoglobin: 8.4 g/dL — ABNORMAL LOW (ref 12.0–15.0)
Immature Granulocytes: 2 %
Lymphocytes Relative: 10 %
Lymphs Abs: 1.4 10*3/uL (ref 0.7–4.0)
MCH: 39.3 pg — ABNORMAL HIGH (ref 26.0–34.0)
MCHC: 34.3 g/dL (ref 30.0–36.0)
MCV: 114.5 fL — ABNORMAL HIGH (ref 80.0–100.0)
Monocytes Absolute: 0.9 10*3/uL (ref 0.1–1.0)
Monocytes Relative: 6 %
Neutro Abs: 11.9 10*3/uL — ABNORMAL HIGH (ref 1.7–7.7)
Neutrophils Relative %: 82 %
Platelets: 60 10*3/uL — ABNORMAL LOW (ref 150–400)
RBC: 2.14 MIL/uL — ABNORMAL LOW (ref 3.87–5.11)
WBC: 14.4 10*3/uL — ABNORMAL HIGH (ref 4.0–10.5)
nRBC: 0.8 % — ABNORMAL HIGH (ref 0.0–0.2)

## 2022-04-18 LAB — GLUCOSE, CAPILLARY
Glucose-Capillary: 110 mg/dL — ABNORMAL HIGH (ref 70–99)
Glucose-Capillary: 134 mg/dL — ABNORMAL HIGH (ref 70–99)
Glucose-Capillary: 140 mg/dL — ABNORMAL HIGH (ref 70–99)
Glucose-Capillary: 160 mg/dL — ABNORMAL HIGH (ref 70–99)

## 2022-04-18 LAB — LACTATE DEHYDROGENASE: LDH: 212 U/L — ABNORMAL HIGH (ref 98–192)

## 2022-04-18 LAB — COMPREHENSIVE METABOLIC PANEL
ALT: 104 U/L — ABNORMAL HIGH (ref 0–44)
AST: 150 U/L — ABNORMAL HIGH (ref 15–41)
Albumin: 2.5 g/dL — ABNORMAL LOW (ref 3.5–5.0)
Alkaline Phosphatase: 109 U/L (ref 38–126)
Anion gap: 9 (ref 5–15)
BUN: 45 mg/dL — ABNORMAL HIGH (ref 6–20)
CO2: 23 mmol/L (ref 22–32)
Calcium: 8.4 mg/dL — ABNORMAL LOW (ref 8.9–10.3)
Chloride: 106 mmol/L (ref 98–111)
Creatinine, Ser: 1.82 mg/dL — ABNORMAL HIGH (ref 0.44–1.00)
GFR, Estimated: 35 mL/min — ABNORMAL LOW (ref >60)
Glucose, Bld: 133 mg/dL — ABNORMAL HIGH (ref 70–99)
Potassium: 4.1 mmol/L (ref 3.5–5.1)
Sodium: 138 mmol/L (ref 135–145)
Total Bilirubin: 8.9 mg/dL — ABNORMAL HIGH (ref 0.3–1.2)
Total Protein: 7.2 g/dL (ref 6.5–8.1)

## 2022-04-18 LAB — RETICULOCYTES
Immature Retic Fract: 16.8 % — ABNORMAL HIGH (ref 2.3–15.9)
RBC.: 2.14 MIL/uL — ABNORMAL LOW (ref 3.87–5.11)
Retic Count, Absolute: 63.8 10*3/uL (ref 19.0–186.0)
Retic Ct Pct: 3 % (ref 0.4–3.1)

## 2022-04-18 SURGERY — ESOPHAGOGASTRODUODENOSCOPY (EGD) WITH PROPOFOL
Anesthesia: Monitor Anesthesia Care

## 2022-04-18 MED ORDER — SUCRALFATE 1 GM/10ML PO SUSP
1.0000 g | Freq: Four times a day (QID) | ORAL | Status: DC
  Administered 2022-04-18 – 2022-04-22 (×16): 1 g via ORAL
  Filled 2022-04-18 (×17): qty 10

## 2022-04-18 MED ORDER — LIDOCAINE 2% (20 MG/ML) 5 ML SYRINGE
INTRAMUSCULAR | Status: DC | PRN
  Administered 2022-04-18 (×2): 40 mg via INTRAVENOUS

## 2022-04-18 MED ORDER — PROPOFOL 500 MG/50ML IV EMUL
INTRAVENOUS | Status: DC | PRN
  Administered 2022-04-18: 125 ug/kg/min via INTRAVENOUS

## 2022-04-18 MED ORDER — SODIUM CHLORIDE 0.9 % IV SOLN: INTRAVENOUS | Status: DC | PRN

## 2022-04-18 MED ORDER — OXYCODONE HCL 5 MG PO TABS
2.5000 mg | ORAL_TABLET | ORAL | Status: DC | PRN
  Administered 2022-04-18 – 2022-04-22 (×6): 5 mg via ORAL
  Filled 2022-04-18 (×6): qty 1

## 2022-04-18 MED ORDER — PROPOFOL 10 MG/ML IV BOLUS
INTRAVENOUS | Status: DC | PRN
  Administered 2022-04-18: 10 mg via INTRAVENOUS
  Administered 2022-04-18: 5 mg via INTRAVENOUS
  Administered 2022-04-18: 10 mg via INTRAVENOUS

## 2022-04-18 MED ORDER — SODIUM CHLORIDE 0.9 % IV SOLN
INTRAVENOUS | Status: AC | PRN
  Administered 2022-04-18: 500 mL via INTRAVENOUS

## 2022-04-18 SURGICAL SUPPLY — 15 items

## 2022-04-18 NOTE — Consult Note (Signed)
[facility] Consult Note  [name] [birth year]  [id number].    Requesting MD: Jacquelin Hawkingalph Nettey Chief Complaint/Reason for Consult: left buttock wound  HPI:  Lori Balsamamisha R Madry is a 46 y.o. female PMH tobacco use, GERD, anemia, hx bariatric surgery, and HTN who presented to the ER 04/09/22 with fatigue and dizziness. She was found to have hypotension and AKI requiring IVF, vasopressors, and ICU admission. She was treated for an aspiration pneumonia, and also found to have norovirus. GI was consulted for acute on chronic anemia and took the patient for EGD. During this procedure they also performed a rectal exam which revealed a large sacral ulcer. WOC has seen but was not sure what type of wound this was, so general surgery was asked to see.    Family History  Problem Relation Age of Onset   Cancer Mother        kidney   Cancer Paternal Aunt    Breast cancer Maternal Aunt    Pancreatic cancer Maternal Aunt    Lung cancer Paternal Uncle     Past Medical History:  Diagnosis Date   Acid reflux    Anemia    Ulcer     Past Surgical History:  Procedure Laterality Date   ABDOMINAL SURGERY     abdominoplasty   BREAST BIOPSY Right 2013   CHOLECYSTECTOMY N/A 11/23/2019   Procedure: LAPAROSCOPIC CHOLECYSTECTOMY WITH INTRAOPERATIVE CHOLANGIOGRAM;  Surgeon: Kinsinger, De BlanchLuke Aaron, MD;  Location: [facility] OR;  Service: General;  Laterality: N/A;   COSMETIC SURGERY     tummy tuck   HERNIA REPAIR     ROUX-EN-Y PROCEDURE     TUMOR REMOVAL     WRIST GANGLION EXCISION      Social History:  reports that she has been smoking cigarettes. She has a 6.25 pack-year smoking history. She has never used smokeless tobacco. She reports that she does not currently use alcohol after a past usage of about 12.0 standard drinks of alcohol per week. She reports that she does not use drugs.  Allergies:  Allergies  Allergen Reactions   Tape Rash    Clear tape    Medications Prior to Admission   Medication Sig Dispense Refill   busPIRone (BUSPAR) 15 MG tablet Take 15 mg by mouth 3 (three) times daily as needed (anxiety).     Cyanocobalamin (B-12 PO) Take 1 tablet by mouth daily.     sertraline (ZOLOFT) 100 MG tablet Take 100 mg by mouth as needed.     folic acid (FOLVITE) 1 MG tablet Take 1 tablet (1 mg total) by mouth daily. (Patient not taking: Reported on 04/11/2022) 30 tablet 6   gabapentin (NEURONTIN) 300 MG capsule Take 1 capsule (300 mg total) by mouth 3 (three) times daily. (Patient not taking: Reported on 04/11/2022) 90 capsule 1   valsartan (DIOVAN) 80 MG tablet Take 80 mg by mouth daily. (Patient not taking: Reported on 04/11/2022)      Prior to Admission medications   Medication Sig Start Date End Date Taking? Authorizing Provider  busPIRone (BUSPAR) 15 MG tablet Take 15 mg by mouth 3 (three) times daily as needed (anxiety). 06/09/21  Yes [provider]  Cyanocobalamin (B-12 PO) Take 1 tablet by mouth daily.   Yes [provider]  sertraline (ZOLOFT) 100 MG tablet Take 100 mg by mouth as needed. 06/15/21  Yes [provider]  folic acid (FOLVITE) 1 MG tablet Take 1 tablet (1 mg total) by mouth daily. Patient not taking:  Reported on 04/11/2022 01/20/22   Georga Kaufmann T, PA-C  gabapentin (NEURONTIN) 300 MG capsule Take 1 capsule (300 mg total) by mouth 3 (three) times daily. Patient not taking: Reported on 04/11/2022 10/04/20   Eustace Moore, MD  valsartan (DIOVAN) 80 MG tablet Take 80 mg by mouth daily. Patient not taking: Reported on 04/11/2022 06/27/21   [provider]  pantoprazole (PROTONIX) 20 MG tablet Take 1 tablet (20 mg total) by mouth daily for 14 days. 08/05/18 10/04/20  Couture, Cortni S, PA-C    Blood pressure 113/72, pulse 86, temperature (!) 97 F (36.1 C), resp. rate (!) 23, height 5\' 6"  (1.676 m), weight 100.6 kg, last menstrual period 03/10/2022, SpO2 92 %. Physical Exam: General: pleasant, WD/WN female who is laying in  bed in NAD HEENT: head is normocephalic, atraumatic.  Sclera. Icterus present.  Pupils equal and round.  Ears and nose without any masses or lesions.  Mouth is pink and moist. Dentition fair Lungs: Respiratory effort nonlabored  Neuro: MAEs, no gross motor or sensory deficits BUE/BLE Skin: warm and dry. 6x5x0.5cm wound over upper left buttock that is tender to touch, wound does not probe very deep, there is no induration, cellulitis, or purulent drainage. The area is very moist and looks like exposed fat, edges somewhat macerated    Results for orders placed or performed during the hospital encounter of 04/09/22 (from the past 48 hour(s))  Glucose, capillary     Status: Abnormal   Collection Time: 04/16/22 11:43 AM  Result Value Ref Range   Glucose-Capillary 134 (H) 70 - 99 mg/dL    Comment: Glucose reference range applies only to samples taken after fasting for at least 8 hours.  Glucose, capillary     Status: Abnormal   Collection Time: 04/16/22  5:39 PM  Result Value Ref Range   Glucose-Capillary 234 (H) 70 - 99 mg/dL    Comment: Glucose reference range applies only to samples taken after fasting for at least 8 hours.  Glucose, capillary     Status: Abnormal   Collection Time: 04/16/22  9:12 PM  Result Value Ref Range   Glucose-Capillary 159 (H) 70 - 99 mg/dL    Comment: Glucose reference range applies only to samples taken after fasting for at least 8 hours.  Glucose, capillary     Status: Abnormal   Collection Time: 04/17/22  7:59 AM  Result Value Ref Range   Glucose-Capillary 137 (H) 70 - 99 mg/dL    Comment: Glucose reference range applies only to samples taken after fasting for at least 8 hours.  CBC with Differential/Platelet     Status: Abnormal   Collection Time: 04/17/22  8:22 AM  Result Value Ref Range   WBC 12.5 (H) 4.0 - 10.5 K/uL   RBC 2.03 (L) 3.87 - 5.11 MIL/uL   Hemoglobin 8.0 (L) 12.0 - 15.0 g/dL    Comment: REPEATED TO VERIFY   HCT 23.0 (L) 36.0 - 46.0 %    MCV 113.3 (H) 80.0 - 100.0 fL   MCH 39.4 (H) 26.0 - 34.0 pg   MCHC 34.8 30.0 - 36.0 g/dL   RDW Not Measured 04/19/22 - 15.5 %   Platelets 50 (L) 150 - 400 K/uL    Comment: Immature Platelet Fraction may be clinically indicated, consider ordering this additional test 56.3 REPEATED TO VERIFY    nRBC 0.6 (H) 0.0 - 0.2 %   Neutrophils Relative % 82 %   Neutro Abs 10.2 (H) 1.7 - 7.7  K/uL   Lymphocytes Relative 11 %   Lymphs Abs 1.3 0.7 - 4.0 K/uL   Monocytes Relative 6 %   Monocytes Absolute 0.8 0.1 - 1.0 K/uL   Eosinophils Relative 0 %   Eosinophils Absolute 0.0 0.0 - 0.5 K/uL   Basophils Relative 0 %   Basophils Absolute 0.0 0.0 - 0.1 K/uL   WBC Morphology MORPHOLOGY UNREMARKABLE    RBC Morphology MORPHOLOGY UNREMARKABLE    Smear Review MORPHOLOGY UNREMARKABLE     Comment: PLATELETS APPEAR DECREASED   Immature Granulocytes 1 %   Abs Immature Granulocytes 0.13 (H) 0.00 - 0.07 K/uL    Comment: Performed at [facility], [address] [place], [place] [address]  Comprehensive metabolic panel     Status: Abnormal   Collection Time: 04/17/22  8:22 AM  Result Value Ref Range   Sodium 139 135 - 145 mmol/L   Potassium 3.7 3.5 - 5.1 mmol/L   Chloride 107 98 - 111 mmol/L   CO2 24 22 - 32 mmol/L   Glucose, Bld 124 (H) 70 - 99 mg/dL    Comment: Glucose reference range applies only to samples taken after fasting for at least 8 hours.   BUN 45 (H) 6 - 20 mg/dL   Creatinine, Ser 8.67 (H) 0.44 - 1.00 mg/dL   Calcium 7.9 (L) 8.9 - 10.3 mg/dL   Total Protein 6.8 6.5 - 8.1 g/dL   Albumin 2.3 (L) 3.5 - 5.0 g/dL   AST 619 (H) 15 - 41 U/L   ALT 81 (H) 0 - 44 U/L   Alkaline Phosphatase 95 38 - 126 U/L   Total Bilirubin 9.5 (H) 0.3 - 1.2 mg/dL   GFR, Estimated 32 (L) >60 mL/min    Comment: (NOTE) Calculated using the CKD-EPI Creatinine Equation (2021)    Anion gap 8 5 - 15    Comment: Performed at [facility], [address], [place], [place] [address]  Lactate dehydrogenase      Status: Abnormal   Collection Time: 04/17/22  8:22 AM  Result Value Ref Range   LDH 212 (H) 98 - 192 U/L    Comment: Performed at [facility], [address], [place], [place] [address]  Protime-INR     Status: Abnormal   Collection Time: 04/17/22  8:22 AM  Result Value Ref Range   Prothrombin Time 19.4 (H) 11.4 - 15.2 seconds   INR 1.7 (H) 0.8 - 1.2    Comment: (NOTE) INR goal varies based on device and disease states. Performed at [facility], [address] [place], [place] [address]   APTT     Status: None   Collection Time: 04/17/22  8:22 AM  Result Value Ref Range   aPTT 33 24 - 36 seconds    Comment: Performed at [facility], [address] [place], [place] [address]  Reticulocytes     Status: Abnormal   Collection Time: 04/17/22  8:22 AM  Result Value Ref Range   Retic Ct Pct 3.0 0.4 - 3.1 %   RBC. 2.09 (L) 3.87 - 5.11 MIL/uL   Retic Count, Absolute 62.7 19.0 - 186.0 K/uL   Immature Retic Fract 14.1 2.3 - 15.9 %    Comment: Performed at [facility], [address] [place], [place] [address]  Glucose, capillary     Status: Abnormal   Collection Time: 04/17/22 11:53 AM  Result Value Ref Range   Glucose-Capillary 132 (  H) 70 - 99 mg/dL    Comment: Glucose reference range applies only to samples taken after fasting for at least 8 hours.  C Difficile Quick Screen (NO PCR Reflex)     Status: None   Collection Time: 04/17/22 12:48 PM   Specimen: STOOL  Result Value Ref Range   C Diff antigen NEGATIVE NEGATIVE   C Diff toxin NEGATIVE NEGATIVE   C Diff interpretation No C. difficile detected.     Comment: Performed at [facility], [address] [place], [place] [address]  Glucose, capillary     Status: Abnormal   Collection Time: 04/17/22  5:42 PM  Result Value Ref Range   Glucose-Capillary 180 (H) 70 - 99 mg/dL    Comment: Glucose reference range applies only to samples taken after fasting for at least 8 hours.  Glucose,  capillary     Status: Abnormal   Collection Time: 04/17/22  8:13 PM  Result Value Ref Range   Glucose-Capillary 143 (H) 70 - 99 mg/dL    Comment: Glucose reference range applies only to samples taken after fasting for at least 8 hours.  CBC with Differential/Platelet     Status: Abnormal   Collection Time: 04/18/22  3:03 AM  Result Value Ref Range   WBC 14.4 (H) 4.0 - 10.5 K/uL   RBC 2.14 (L) 3.87 - 5.11 MIL/uL   Hemoglobin 8.4 (L) 12.0 - 15.0 g/dL   HCT 50.3 (L) 54.6 - 56.8 %   MCV 114.5 (H) 80.0 - 100.0 fL   MCH 39.3 (H) 26.0 - 34.0 pg   MCHC 34.3 30.0 - 36.0 g/dL   RDW Not Measured 12.7 - 15.5 %   Platelets 60 (L) 150 - 400 K/uL    Comment: Immature Platelet Fraction may be clinically indicated, consider ordering this additional test [id number] REPEATED TO VERIFY    nRBC 0.8 (H) 0.0 - 0.2 %   Neutrophils Relative % 82 %   Neutro Abs 11.9 (H) 1.7 - 7.7 K/uL   Lymphocytes Relative 10 %   Lymphs Abs 1.4 0.7 - 4.0 K/uL   Monocytes Relative 6 %   Monocytes Absolute 0.9 0.1 - 1.0 K/uL   Eosinophils Relative 0 %   Eosinophils Absolute 0.0 0.0 - 0.5 K/uL   Basophils Relative 0 %   Basophils Absolute 0.0 0.0 - 0.1 K/uL   Immature Granulocytes 2 %   Abs Immature Granulocytes 0.22 (H) 0.00 - 0.07 K/uL    Comment: Performed at [facility], [address], [place], [place] [address]  Comprehensive metabolic panel     Status: Abnormal   Collection Time: 04/18/22  3:03 AM  Result Value Ref Range   Sodium 138 135 - 145 mmol/L   Potassium 4.1 3.5 - 5.1 mmol/L   Chloride 106 98 - 111 mmol/L   CO2 23 22 - 32 mmol/L   Glucose, Bld 133 (H) 70 - 99 mg/dL    Comment: Glucose reference range applies only to samples taken after fasting for at least 8 hours.   BUN 45 (H) 6 - 20 mg/dL   Creatinine, Ser 7.59 (H) 0.44 - 1.00 mg/dL   Calcium 8.4 (L) 8.9 - 10.3 mg/dL   Total Protein 7.2 6.5 - 8.1 g/dL   Albumin 2.5 (L) 3.5 - 5.0 g/dL   AST 163 (H) 15 - 41 U/L   ALT 104 (H) 0 - 44 U/L    Alkaline Phosphatase 109 38 - 126 U/L  Total Bilirubin 8.9 (H) 0.3 - 1.2 mg/dL   GFR, Estimated 35 (L) >60 mL/min    Comment: (NOTE) Calculated using the CKD-EPI Creatinine Equation (2021)    Anion gap 9 5 - 15    Comment: Performed at [facility], [address], [place], [place] [address]  Lactate dehydrogenase     Status: Abnormal   Collection Time: 04/18/22  3:03 AM  Result Value Ref Range   LDH 212 (H) 98 - 192 U/L    Comment: Performed at [facility], [address], [place], [place] [address]  Reticulocytes     Status: Abnormal   Collection Time: 04/18/22  3:03 AM  Result Value Ref Range   Retic Ct Pct 3.0 0.4 - 3.1 %   RBC. 2.14 (L) 3.87 - 5.11 MIL/uL   Retic Count, Absolute 63.8 19.0 - 186.0 K/uL   Immature Retic Fract 16.8 (H) 2.3 - 15.9 %    Comment: Performed at [facility], [address] [place], [place] [address]  Glucose, capillary     Status: Abnormal   Collection Time: 04/18/22  9:16 AM  Result Value Ref Range   Glucose-Capillary 110 (H) 70 - 99 mg/dL    Comment: Glucose reference range applies only to samples taken after fasting for at least 8 hours.   No results found.    Assessment/Plan Left buttock wound - New left buttock wound looks like an area of skin breakdown from pressure and moisture. No concerns for underlying abscess/infection given no induration, fluctuance, cellulitis, or purulent drainage. I don't think she needs any imaging or antibiotics for this. Recommend keeping the area dry with local wound care. Can secure dry dressing with mesh underwear if needed. Off load pressure.  We will sign off, please call with questions or concerns.    I reviewed WOC notes, hospitalist notes, last 24 h vitals and pain scores, last 48 h intake and output, last 24 h labs and trends, and last 24 h imaging results   Franne Forts, PA-C [facility] 04/18/2022, 11:05 AM Please see Amion for pager number during day hours  7:00am-4:30pm

## 2022-04-18 NOTE — Consult Note (Addendum)
WOC Nurse Consult Note: Patient receiving care in [facility] [phone].  Reason for Consult: sacral wound Wound type: full thickness, non-viable tissue wound of unknown origin to left upper buttock area, close to the anal area--see photo from today Pressure Injury POA: Yes/No/NA Measurement: 6 cm x 6 cm x 1 cm Wound bed: see photo. True wound bed could not be visualized.   Drainage (amount, consistency, odor) no drainage, no odor. Periwound: fragile, stringy appearing skin. VERY tender to touch. Patient showed the bottle of cream she has been placing to the area. It turns out it is a container of Gerhardt's butt cream.  Which really is not indicated for this type of wound.  Dressing procedure/placement/frequency: Dr. Caleb Popp was still on the unit, and I explained I really did not know what type of wound I saw during my exam. He agreed to join me and we evaluated it together.  He was able to place a photo of the wound in the record.  I explained to the patient and to Dr. Caleb Popp I really could not determine what the cause of the wound is.  Perhaps it could be a ruptured pocket of infection. It does not appear consistent with a pressure injury nor with MASD-IAD or ITD.  Dr. Caleb Popp has agreed to order any additional imaging studies he feels is appropriate, and to reach out to surgery if indicated.  At this time I can say a dressing will not stay in the area due to the moisture to the area and the body folds, and I do not know what type of topical treatment might be most advantageous for this patient.   I believe the best approach at this time is for further investigation into the area as the medical team deems appropriate for this ambulatory continent patient.  Thank you for the consult.  Discussed plan of care with the patient and bedside nurse.  WOC nurse will not follow at this time.  Please re-consult the WOC team if needed.  Helmut Muster, RN, MSN, CWOCN, CNS-BC, pager [phone]

## 2022-04-18 NOTE — Progress Notes (Addendum)
PROGRESS NOTE    [name]  QBH:[id number] DOB: April 19, 1976 DOA: 04/09/2022 PCP: Patient, No Pcp Per   Brief Narrative: [name] is a 46 y.o. female with a history of tobacco use, GERD, anemia, s/p bariatric surgery, hypertension. Patient presented secondary to fatigue and dizziness and found to have hypotension and AKI requiring initiation of vasopressors in addition to IV fluids for resuscitation. Patient with evidence of alcoholic hepatitis, started on prednisolone. Possible GI bleeding; GI consulted and performed EGD on 7/25 revealing a non-bleeding ulcer.    Assessment and Plan:  Acute respiratory failure with hypoxia Mild with associated presumed aspiration pneumonia. Oxygen requirement up to 4 l/min via supplemental oxygen. Patient did not require intubation while in the ICU.  Possible aspiration pneumonia Patient started on Unasyn IV and transitioned to Augmentin.  -Continue Augmentin  Acute on chronic anemia Baseline hemoglobin appears to be around 12-14. Hemoglobin on presentation of 7 with downtrend as low as 5.1. patient has required 3 units of PRBC to date. Currently, patient reports bright red blood per rectum but states this is from tears she has in the area. She reports that she has not seen gross bloody stools. She has no history of colonoscopy but has a history of EGD in the past with evidence of ulcers, per her report. Complicated by thrombocytopenia. Complicated by alcohol use and liver disease in addition to possible marrow suppression. EGD this admission revealed a non-bleeding ulcer at GJ anastomosis. Hemoglobin stable. -Trend CBC  Hypovolemic shock Present on admission. Patient admitted to the ICU for vasopressor support and IV fluids. Patient was started on Vasopressin and Norepinephrine on 7/17, which were weaned off on 7/21. Resolved.  Thrombocytopenia Likely secondary to underlying liver disease. Hematology consulted. Platelets stable and  improving slightly.  Acute gastroenteritis secondary to norovirus -Continue supportive care -Enteric precautions  AKI Hepatorenal syndrome Nephrology consulted. Creatinine of 6.38 on admission with peak creatinine of 6.54. Patient managed with vasopressors and IV fluids with steady improvement of kidney function. No renal replacement therapy initiated. Nephrology signed off.  Acute alcoholic hepatitis Severe fatty liver disease Patient started on prednisolone for treatment. GI consulted for above. -GI recommendations  Tobacco abuse Alcohol abuse Counseled this admission. TOC consulted.  GJ anastomosis ulcer Noticed on EGD. Biopsies obtained. Recommendations for Protonix BID and sucralfate suspension 1 g PO QID for 2 weeks in addition to strict avoidance of all NSAIDs.  Buttock wound Noticed on rectal examination by GI. General surgery consulted and recommend local wound care; likely skin breakdown from moisture/pressure.  Lower extremity edema Significant. Secondary to fluid resuscitation in combination of low albumin. Patient with low-normal blood pressure and improving creatinine at this time. -Recommend supportive care   DVT prophylaxis: SCDs Code Status:   Code Status: Full Code Family Communication: None at bedside Disposition Plan: Discharge likely in 2-3 days pending stable hemoglobin, GI recommendations, ability to wean to room air if able.   Consultants:  PCCM Nephrology Gastroenterology  Procedures:  Transthoracic Echocardiogram  Antimicrobials: Unasyn Augmentin    Subjective: Patient is very bothered by her lower extremity swelling.  Objective: BP 113/72   Pulse 82   Temp (!) 97 F (36.1 C)   Resp (!) 23   Ht 5\' 6"  (1.676 m)   Wt 100.6 kg   LMP 03/10/2022 (Approximate)   SpO2 94%   BMI 35.80 kg/m   Examination:  General exam: Appears calm and comfortable Respiratory system: Clear to auscultation. Respiratory effort normal. Cardiovascular  system:  S1 & S2 heard, RRR. 2/6 systolic murmur Gastrointestinal system: Abdomen is nondistended, soft and nontender. No organomegaly or masses felt. Normal bowel sounds heard. Central nervous system: Alert and oriented. No focal neurological deficits. Musculoskeletal: 2+ BLE edema. No calf tenderness Psychiatry: Judgement and insight appear normal. Mood & affect appropriate.  Skin:     Data Reviewed: I have personally reviewed following labs and imaging studies  CBC Lab Results  Component Value Date   WBC 14.4 (H) 04/18/2022   RBC 2.14 (L) 04/18/2022   RBC 2.14 (L) 04/18/2022   HGB 8.4 (L) 04/18/2022   HCT 24.5 (L) 04/18/2022   MCV 114.5 (H) 04/18/2022   MCH 39.3 (H) 04/18/2022   PLT 60 (L) 04/18/2022   MCHC 34.3 04/18/2022   RDW Not Measured 04/18/2022   LYMPHSABS 1.4 04/18/2022   MONOABS 0.9 04/18/2022   EOSABS 0.0 04/18/2022   BASOSABS 0.0 04/18/2022     Last metabolic panel Lab Results  Component Value Date   NA 138 04/18/2022   K 4.1 04/18/2022   CL 106 04/18/2022   CO2 23 04/18/2022   BUN 45 (H) 04/18/2022   CREATININE 1.82 (H) 04/18/2022   GLUCOSE 133 (H) 04/18/2022   GFRNONAA 35 (L) 04/18/2022   GFRAA >60 11/24/2019   CALCIUM 8.4 (L) 04/18/2022   PHOS 2.7 04/15/2022   PROT 7.2 04/18/2022   ALBUMIN 2.5 (L) 04/18/2022   BILITOT 8.9 (H) 04/18/2022   ALKPHOS 109 04/18/2022   AST 150 (H) 04/18/2022   ALT 104 (H) 04/18/2022   ANIONGAP 9 04/18/2022    GFR: Estimated Creatinine Clearance: 47.2 mL/min (A) (by C-G formula based on SCr of 1.82 mg/dL (H)).  Recent Results (from the past 240 hour(s))  Gastrointestinal Panel by PCR , Stool     Status: Abnormal   Collection Time: 04/10/22  8:50 AM   Specimen: Stool  Result Value Ref Range Status   Campylobacter species NOT DETECTED NOT DETECTED Final   Plesimonas shigelloides NOT DETECTED NOT DETECTED Final   Salmonella species NOT DETECTED NOT DETECTED Final   Yersinia enterocolitica NOT DETECTED NOT  DETECTED Final   Vibrio species NOT DETECTED NOT DETECTED Final   Vibrio cholerae NOT DETECTED NOT DETECTED Final   Enteroaggregative E coli (EAEC) NOT DETECTED NOT DETECTED Final   Enteropathogenic E coli (EPEC) NOT DETECTED NOT DETECTED Final   Enterotoxigenic E coli (ETEC) NOT DETECTED NOT DETECTED Final   Shiga like toxin producing E coli (STEC) NOT DETECTED NOT DETECTED Final   Shigella/Enteroinvasive E coli (EIEC) NOT DETECTED NOT DETECTED Final   Cryptosporidium NOT DETECTED NOT DETECTED Final   Cyclospora cayetanensis NOT DETECTED NOT DETECTED Final   Entamoeba histolytica NOT DETECTED NOT DETECTED Final   Giardia lamblia NOT DETECTED NOT DETECTED Final   Adenovirus F40/41 NOT DETECTED NOT DETECTED Final   Astrovirus NOT DETECTED NOT DETECTED Final   Norovirus GI/GII DETECTED (A) NOT DETECTED Final    Comment: RESULT CALLED TO, READ BACK BY AND VERIFIED WITH: TONYA SHELTON 04/10/22 1418 KLW    Rotavirus A NOT DETECTED NOT DETECTED Final   Sapovirus (I, II, IV, and V) NOT DETECTED NOT DETECTED Final    Comment: Performed at [facility], [address] [place], [place] [address]  Culture, blood (Routine X 2) w Reflex to ID Panel     Status: None   Collection Time: 04/10/22  4:04 PM   Specimen: BLOOD RIGHT HAND  Result Value Ref Range Status   Specimen Description BLOOD  RIGHT HAND  Final   Special Requests   Final    BOTTLES DRAWN AEROBIC AND ANAEROBIC Blood Culture adequate volume   Culture   Final    NO GROWTH 5 DAYS Performed at [facility], [address] [place], [place] [address]    Report Status 04/15/2022 FINAL  Final  Gastrointestinal Panel by PCR , Stool     Status: None   Collection Time: 04/11/22  8:50 AM  Result Value Ref Range Status   Campylobacter species NOT DETECTED NOT DETECTED Final   Plesimonas shigelloides NOT DETECTED NOT DETECTED Final   Salmonella species NOT DETECTED NOT DETECTED Final   Yersinia enterocolitica NOT DETECTED NOT  DETECTED Final   Vibrio species NOT DETECTED NOT DETECTED Final   Vibrio cholerae NOT DETECTED NOT DETECTED Final   Enteroaggregative E coli (EAEC) NOT DETECTED NOT DETECTED Final   Enteropathogenic E coli (EPEC) NOT DETECTED NOT DETECTED Final   Enterotoxigenic E coli (ETEC) NOT DETECTED NOT DETECTED Final   Shiga like toxin producing E coli (STEC) NOT DETECTED NOT DETECTED Final   Shigella/Enteroinvasive E coli (EIEC) NOT DETECTED NOT DETECTED Final   Cryptosporidium NOT DETECTED NOT DETECTED Final   Cyclospora cayetanensis NOT DETECTED NOT DETECTED Final   Entamoeba histolytica NOT DETECTED NOT DETECTED Final   Giardia lamblia NOT DETECTED NOT DETECTED Final   Adenovirus F40/41 NOT DETECTED NOT DETECTED Final   Astrovirus NOT DETECTED NOT DETECTED Final   Norovirus GI/GII NOT DETECTED NOT DETECTED Final   Rotavirus A NOT DETECTED NOT DETECTED Final   Sapovirus (I, II, IV, and V) NOT DETECTED NOT DETECTED Final    Comment: Performed at [facility], [address] [place], [place] [address]  C Difficile Quick Screen (NO PCR Reflex)     Status: None   Collection Time: 04/17/22 12:48 PM   Specimen: STOOL  Result Value Ref Range Status   C Diff antigen NEGATIVE NEGATIVE Final   C Diff toxin NEGATIVE NEGATIVE Final   C Diff interpretation No C. difficile detected.  Final    Comment: Performed at [facility], [address], [place], [place] [address]      Radiology Studies: No results found.    LOS: 8 days    Jacquelin Hawking, MD [facility] 04/18/2022, 9:11 AM   If 7PM-7AM, please contact night-coverage [email or web address]

## 2022-04-18 NOTE — Interval H&P Note (Signed)
History and Physical Interval Note:  04/18/2022 7:38 AM  [name]  has presented today for surgery, with the diagnosis of anemia.  hx anastomotic ulcers.  roux n y bypass..  The various methods of treatment have been discussed with the patient and family. After consideration of risks, benefits and other options for treatment, the patient has consented to  Procedure(s): ESOPHAGOGASTRODUODENOSCOPY (EGD) WITH PROPOFOL (N/A) as a surgical intervention.  The patient's history has been reviewed, patient examined, no change in status, stable for surgery.  I have reviewed the patient's chart and labs.  Questions were answered to the patient's satisfaction.     Lynann Bologna

## 2022-04-18 NOTE — Anesthesia Procedure Notes (Signed)
Procedure Name: MAC Date/Time: 04/18/2022 10:48 AM  Performed by: Janene Harvey, CRNAPre-anesthesia Checklist: Patient identified, Emergency Drugs available, Suction available and Patient being monitored Patient Re-evaluated:Patient Re-evaluated prior to induction Oxygen Delivery Method: Simple face mask Induction Type: IV induction Placement Confirmation: positive ETCO2 Dental Injury: Teeth and Oropharynx as per pre-operative assessment

## 2022-04-18 NOTE — Transfer of Care (Signed)
Immediate Anesthesia Transfer of Care Note  Patient: [name]  Procedure(s) Performed: ESOPHAGOGASTRODUODENOSCOPY (EGD) WITH PROPOFOL BIOPSY  Patient Location: PACU  Anesthesia Type:MAC  Level of Consciousness: awake and patient cooperative  Airway & Oxygen Therapy: Patient Spontanous Breathing  Post-op Assessment: Report given to RN and Post -op Vital signs reviewed and stable  Post vital signs: Reviewed and stable  Last Vitals:  Vitals Value Taken Time  BP    Temp    Pulse 82 04/18/22 0833  Resp 19 04/18/22 0833  SpO2 92 % 04/18/22 0831  Vitals shown include unvalidated device data.  Last Pain:  Vitals:   04/18/22 0720  TempSrc: Temporal  PainSc: 0-No pain         Complications: No notable events documented.

## 2022-04-18 NOTE — Op Note (Addendum)
[facility] Patient Name: [name] Procedure Date : 04/18/2022 MRN: [id number] Attending MD: Jackquline Denmark , MD Date of Birth: 11-Jun-1976 CSN: [id number] Age: 46 Admit Type: Inpatient Procedure:                Upper GI endoscopy Indications:              Epigastric abdominal pain, acute on chronic anemia.                            H/O RYGB x 2 (2010, then revision 2014 d/t marginal                            ulcers) Providers:                Jackquline Denmark, MD, Dulcy Fanny, Frazier Richards,                            Technician Referring MD:              Medicines:                Monitored Anesthesia Care Complications:            No immediate complications. Estimated Blood Loss:     Estimated blood loss: none. Procedure:                Pre-Anesthesia Assessment:                           - Prior to the procedure, a History and Physical                            was performed, and patient medications and                            allergies were reviewed. The patient's tolerance of                            previous anesthesia was also reviewed. The risks                            and benefits of the procedure and the sedation                            options and risks were discussed with the patient.                            All questions were answered, and informed consent                            was obtained. Prior Anticoagulants: The patient has                            taken no previous anticoagulant or antiplatelet                            agents.  ASA Grade Assessment: IV - A patient with                            severe systemic disease that is a constant threat                            to life. After reviewing the risks and benefits,                            the patient was deemed in satisfactory condition to                            undergo the procedure.                           After obtaining informed consent, the endoscope was                             passed under direct vision. Throughout the                            procedure, the patient's blood pressure, pulse, and                            oxygen saturations were monitored continuously. The                            GIF-H190 ([id number]) Olympus endoscope was introduced                            through the mouth, and advanced to the proximal                            jejunum. The upper GI endoscopy was accomplished                            without difficulty. The patient tolerated the                            procedure well. Scope In: Scope Out: Findings:      Patchy, white plaques were found in the middle third of the esophagus       and in the lower third of the esophagus. Biopsies were taken with a cold       forceps for histology.      A small hiatal hernia was present.      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 Mild gastritis with friability of gastric mucosa. The       gastrojejunal anastomosis was characterized by 1 cm x 1.5 cm nonbleeding       marginal ulcer with clean base and erythematous margins. This was       towards the distal site of anastomosis. This was traversed. Biopsies       were taken with a cold forceps for histology.  The examined jejunum was normal. Impression:               - Esophageal plaques were found, suspicious for                            candidiasis. Biopsied.                           - Small hiatal hernia.                           - Roux-en-Y gastrojejunostomy with moderate                            marginal ulcer. Biopsied.                           - Normal examined jejunum. Recommendation:           - Return patient to hospital ward for ongoing care.                           - Resume previous diet.                           - Continue present medications including Protonix                            40 twice daily.                            - Use sucralfate suspension 1 gram PO QID for 2                            weeks.                           - Strictly avoid all nonsteroidals.                           - Follow biopsies.                           - FU at [facility] as outpt                           - The findings and recommendations were discussed                            with the patient's family.                           Addendum: Rectal examination was performed after                            the endoscopy. It revealed a large sacral ulcer.  Moderate internal hemorrhoids. No active bleeding.                            I have gotten in touch with Dr. Caleb Popp thru secure                            messaging to consult wound care. Procedure Code(s):        --- Professional ---                           [phone], Esophagogastroduodenoscopy, flexible,                            transoral; with biopsy, single or multiple Diagnosis Code(s):        --- Professional ---                           K22.9, Disease of esophagus, unspecified                           K44.9, Diaphragmatic hernia without obstruction or                            gangrene                           Z98.0, Intestinal bypass and anastomosis status                           R10.13, Epigastric pain                           D50.0, Iron deficiency anemia secondary to blood                            loss (chronic) CPT copyright 2019 American Medical Association. All rights reserved. The codes documented in this report are preliminary and upon coder review may  be revised to meet current compliance requirements. Lynann Bologna, MD 04/18/2022 8:31:42 AM This report has been signed electronically. Number of Addenda: 0

## 2022-04-18 NOTE — Plan of Care (Signed)
  Problem: Education: Goal: Knowledge of General Education information will improve Description: Including pain rating scale, medication(s)/side effects and non-pharmacologic comfort measures Outcome: Progressing   Problem: Nutrition: Goal: Adequate nutrition will be maintained Outcome: Progressing   Problem: Activity: Goal: Risk for activity intolerance will decrease Outcome: Progressing   

## 2022-04-18 NOTE — Anesthesia Preprocedure Evaluation (Signed)
Anesthesia Evaluation  Patient identified by MRN, date of birth, ID band Patient awake    Reviewed: Allergy & Precautions, NPO status , Patient's Chart, lab work & pertinent test results  History of Anesthesia Complications Negative for: history of anesthetic complications  Airway Mallampati: III  TM Distance: >3 FB Neck ROM: Full    Dental  (+) Teeth Intact, Dental Advisory Given   Pulmonary neg shortness of breath, neg sleep apnea, neg COPD, neg recent URI, Current Smoker and Patient abstained from smoking.,    breath sounds clear to auscultation       Cardiovascular  Rhythm:Regular  1. Left ventricular ejection fraction, by estimation, is >75%. The left  ventricle has hyperdynamic function. The left ventricle has no regional  wall motion abnormalities. Left ventricular diastolic parameters were  normal.  2. Right ventricular systolic function is normal. The right ventricular  size is normal. There is normal pulmonary artery systolic pressure.  3. The mitral valve is normal in structure. Trivial mitral valve  regurgitation. No evidence of mitral stenosis.  4. The aortic valve was not well visualized. Aortic valve regurgitation  is not visualized. No aortic stenosis is present.    Neuro/Psych negative neurological ROS  negative psych ROS   GI/Hepatic GERD  ,(+) Hepatitis -Lab Results      Component                Value               Date                      ALT                      104 (H)             04/18/2022                AST                      150 (H)             04/18/2022                ALKPHOS                  109                 04/18/2022                BILITOT                  8.9 (H)             04/18/2022            ? GI bleed   Endo/Other  negative endocrine ROSLab Results      Component                Value               Date                      HGBA1C                   3.8 (L)              04/13/2022             Renal/GU ARF and CRFRenal diseaseLab Results  Component                Value               Date                      CREATININE               1.82 (H)            04/18/2022           Lab Results      Component                Value               Date                      K                        4.1                 04/18/2022                Musculoskeletal negative musculoskeletal ROS (+)   Abdominal   Peds  Hematology  (+) Blood dyscrasia, anemia , Lab Results      Component                Value               Date                      WBC                      14.4 (H)            04/18/2022                HGB                      8.4 (L)             04/18/2022                HCT                      24.5 (L)            04/18/2022                MCV                      114.5 (H)           04/18/2022                PLT                      60 (L)              04/18/2022              Anesthesia Other Findings   Reproductive/Obstetrics                             Anesthesia Physical Anesthesia Plan  ASA: 4  Anesthesia Plan: MAC   Post-op Pain Management: Minimal or no pain anticipated  Induction: Intravenous  PONV Risk Score and Plan: 1 and Propofol infusion and Treatment may vary due to age or medical condition  Airway Management Planned: Nasal Cannula, Natural Airway and Simple Face Mask  Additional Equipment: None  Intra-op Plan:   Post-operative Plan:   Informed Consent: I have reviewed the patients History and Physical, chart, labs and discussed the procedure including the risks, benefits and alternatives for the proposed anesthesia with the patient or authorized representative who has indicated his/her understanding and acceptance.     Dental advisory given  Plan Discussed with: CRNA  Anesthesia Plan Comments:         Anesthesia Quick Evaluation

## 2022-04-19 ENCOUNTER — Encounter (HOSPITAL_COMMUNITY): Payer: Self-pay | Admitting: Gastroenterology

## 2022-04-19 ENCOUNTER — Inpatient Hospital Stay (HOSPITAL_COMMUNITY): Payer: Self-pay

## 2022-04-19 DIAGNOSIS — E876 Hypokalemia: Secondary | ICD-10-CM

## 2022-04-19 DIAGNOSIS — J9601 Acute respiratory failure with hypoxia: Secondary | ICD-10-CM

## 2022-04-19 DIAGNOSIS — J690 Pneumonitis due to inhalation of food and vomit: Secondary | ICD-10-CM

## 2022-04-19 DIAGNOSIS — E872 Acidosis, unspecified: Secondary | ICD-10-CM

## 2022-04-19 DIAGNOSIS — R571 Hypovolemic shock: Secondary | ICD-10-CM

## 2022-04-19 DIAGNOSIS — K767 Hepatorenal syndrome: Secondary | ICD-10-CM

## 2022-04-19 DIAGNOSIS — E669 Obesity, unspecified: Secondary | ICD-10-CM

## 2022-04-19 DIAGNOSIS — R601 Generalized edema: Secondary | ICD-10-CM

## 2022-04-19 DIAGNOSIS — L89154 Pressure ulcer of sacral region, stage 4: Secondary | ICD-10-CM

## 2022-04-19 DIAGNOSIS — K289 Gastrojejunal ulcer, unspecified as acute or chronic, without hemorrhage or perforation: Secondary | ICD-10-CM

## 2022-04-19 LAB — COMPREHENSIVE METABOLIC PANEL
ALT: 102 U/L — ABNORMAL HIGH (ref 0–44)
AST: 134 U/L — ABNORMAL HIGH (ref 15–41)
Albumin: 2.2 g/dL — ABNORMAL LOW (ref 3.5–5.0)
Alkaline Phosphatase: 101 U/L (ref 38–126)
Anion gap: 7 (ref 5–15)
BUN: 40 mg/dL — ABNORMAL HIGH (ref 6–20)
CO2: 23 mmol/L (ref 22–32)
Calcium: 8.6 mg/dL — ABNORMAL LOW (ref 8.9–10.3)
Chloride: 109 mmol/L (ref 98–111)
Creatinine, Ser: 1.69 mg/dL — ABNORMAL HIGH (ref 0.44–1.00)
GFR, Estimated: 38 mL/min — ABNORMAL LOW (ref >60)
Glucose, Bld: 125 mg/dL — ABNORMAL HIGH (ref 70–99)
Potassium: 4.1 mmol/L (ref 3.5–5.1)
Sodium: 139 mmol/L (ref 135–145)
Total Bilirubin: 7.9 mg/dL — ABNORMAL HIGH (ref 0.3–1.2)
Total Protein: 6.5 g/dL (ref 6.5–8.1)

## 2022-04-19 LAB — CBC WITH DIFFERENTIAL/PLATELET
Abs Immature Granulocytes: 0.19 10*3/uL — ABNORMAL HIGH (ref 0.00–0.07)
Basophils Absolute: 0 10*3/uL (ref 0.0–0.1)
Basophils Relative: 0 %
Eosinophils Absolute: 0 10*3/uL (ref 0.0–0.5)
Eosinophils Relative: 0 %
HCT: 21.5 % — ABNORMAL LOW (ref 36.0–46.0)
Hemoglobin: 7.4 g/dL — ABNORMAL LOW (ref 12.0–15.0)
Immature Granulocytes: 1 %
Lymphocytes Relative: 11 %
Lymphs Abs: 1.6 10*3/uL (ref 0.7–4.0)
MCH: 39.2 pg — ABNORMAL HIGH (ref 26.0–34.0)
MCHC: 34.4 g/dL (ref 30.0–36.0)
MCV: 113.8 fL — ABNORMAL HIGH (ref 80.0–100.0)
Monocytes Absolute: 0.8 10*3/uL (ref 0.1–1.0)
Monocytes Relative: 6 %
Neutro Abs: 11.2 10*3/uL — ABNORMAL HIGH (ref 1.7–7.7)
Neutrophils Relative %: 82 %
Platelets: 48 10*3/uL — ABNORMAL LOW (ref 150–400)
RBC: 1.89 MIL/uL — ABNORMAL LOW (ref 3.87–5.11)
WBC: 13.8 10*3/uL — ABNORMAL HIGH (ref 4.0–10.5)
nRBC: 0.9 % — ABNORMAL HIGH (ref 0.0–0.2)

## 2022-04-19 LAB — GLUCOSE, CAPILLARY
Glucose-Capillary: 118 mg/dL — ABNORMAL HIGH (ref 70–99)
Glucose-Capillary: 133 mg/dL — ABNORMAL HIGH (ref 70–99)
Glucose-Capillary: 186 mg/dL — ABNORMAL HIGH (ref 70–99)
Glucose-Capillary: 136 mg/dL — ABNORMAL HIGH (ref 70–99)

## 2022-04-19 LAB — RETICULOCYTES
Immature Retic Fract: 18.8 % — ABNORMAL HIGH (ref 2.3–15.9)
RBC.: 1.93 MIL/uL — ABNORMAL LOW (ref 3.87–5.11)
Retic Count, Absolute: 56.4 10*3/uL (ref 19.0–186.0)
Retic Ct Pct: 2.9 % (ref 0.4–3.1)

## 2022-04-19 LAB — LACTATE DEHYDROGENASE: LDH: 200 U/L — ABNORMAL HIGH (ref 98–192)

## 2022-04-19 LAB — SURGICAL PATHOLOGY

## 2022-04-19 MED ORDER — FERROUS SULFATE 325 (65 FE) MG PO TABS
325.0000 mg | ORAL_TABLET | Freq: Every day | ORAL | Status: DC
  Administered 2022-04-19 – 2022-04-22 (×4): 325 mg via ORAL
  Filled 2022-04-19 (×4): qty 1

## 2022-04-19 MED ORDER — PANTOPRAZOLE SODIUM 40 MG PO TBEC
40.0000 mg | DELAYED_RELEASE_TABLET | Freq: Two times a day (BID) | ORAL | Status: DC
  Administered 2022-04-19 – 2022-04-22 (×7): 40 mg via ORAL
  Filled 2022-04-19 (×7): qty 1

## 2022-04-19 MED ORDER — FUROSEMIDE 10 MG/ML IJ SOLN
40.0000 mg | Freq: Once | INTRAMUSCULAR | Status: AC
Start: 2022-04-19 — End: 2022-04-19
  Administered 2022-04-19: 40 mg via INTRAVENOUS
  Filled 2022-04-19: qty 4

## 2022-04-19 NOTE — Assessment & Plan Note (Signed)
-   Continue prednisolone 30 days - Follow up with GI - Complete alcohol abstinence

## 2022-04-19 NOTE — Assessment & Plan Note (Signed)
Improved

## 2022-04-19 NOTE — Assessment & Plan Note (Signed)
-   Continue PPI and sucralfate - Follow up with GI

## 2022-04-19 NOTE — Progress Notes (Signed)
Mobility Specialist Progress Note:   04/19/22 1100  Mobility  Activity Ambulated with assistance in hallway  Level of Assistance Standby assist, set-up cues, supervision of patient - no hands on  Assistive Device None  Distance Ambulated (ft) 550 ft  Activity Response Tolerated well  $Mobility charge 1 Mobility   Pt eager for mobility session. Ambulated on RA throughout with x1 episode of SpO2 88%, recovered with standing rest break and PLB. C/o bilat ankle pain d/t swelling. Pt back in chair with all needs met.   [name] Acute Rehab Secure Chat or Office Phone: [phone]

## 2022-04-19 NOTE — Assessment & Plan Note (Signed)
Resolved

## 2022-04-19 NOTE — Assessment & Plan Note (Signed)
BMI 36 

## 2022-04-19 NOTE — Assessment & Plan Note (Signed)
TTP/HUS ruled out.

## 2022-04-19 NOTE — Anesthesia Postprocedure Evaluation (Signed)
Anesthesia Post Note  Patient: [name]  Procedure(s) Performed: ESOPHAGOGASTRODUODENOSCOPY (EGD) WITH PROPOFOL BIOPSY     Patient location during evaluation: Endoscopy Anesthesia Type: MAC Level of consciousness: awake and alert Pain management: pain level controlled Vital Signs Assessment: post-procedure vital signs reviewed and stable Respiratory status: spontaneous breathing, nonlabored ventilation, respiratory function stable and patient connected to nasal cannula oxygen Cardiovascular status: stable and blood pressure returned to baseline Postop Assessment: no apparent nausea or vomiting Anesthetic complications: no   No notable events documented.  Last Vitals:  Vitals:   04/19/22 0622 04/19/22 0759  BP: (!) 103/50 (!) 105/52  Pulse: 73 79  Resp: 18 17  Temp: 36.6 C 36.8 C  SpO2: 93%     Last Pain:  Vitals:   04/19/22 0759  TempSrc: Oral  PainSc:                  Chelisa Hennen

## 2022-04-19 NOTE — Assessment & Plan Note (Signed)
Cr sliughtly better with diuresis.

## 2022-04-19 NOTE — Assessment & Plan Note (Signed)
Still very swollen today, improved slightly with Lasix yesterday.  CXR with edema yesterday . Off O2.   - Repeat Lasix - Trend Cr

## 2022-04-19 NOTE — Assessment & Plan Note (Signed)
Hgb stable 

## 2022-04-19 NOTE — TOC CAGE-AID Note (Signed)
Transition of Care [facility]) - CAGE-AID Screening   Patient Details  Name: [name] MRN: [id number] Date of Birth: Feb 11, 1976  Transition of Care [facility]) CM/SW Contact:    Lawerance Sabal, RN Phone Number: 04/19/2022, 9:51 AM   Clinical Narrative:  Patient screened and declined resources.  CAGE-AID Screening: Substance Abuse Screening unable to be completed due to: : Patient Refused  Have You Ever Felt You Ought to Cut Down on Your Drinking or Drug Use?: Yes Have People Annoyed You By Critizing Your Drinking Or Drug Use?: No Have You Felt Bad Or Guilty About Your Drinking Or Drug Use?: Yes Have You Ever Had a Drink or Used Drugs First Thing In The Morning to Steady Your Nerves or to Get Rid of a Hangover?: No CAGE-AID Score: 2  Substance Abuse Education Offered: Yes  Substance abuse interventions: Other (must comment) (patient declined)

## 2022-04-19 NOTE — TOC Initial Note (Addendum)
Transition of Care [facility]) - Initial/Assessment Note    Patient Details  Name: [name] MRN: [id number] Date of Birth: 03/07/1976  Transition of Care [facility]) CM/SW Contact:    Lawerance Sabal, RN Phone Number: 04/19/2022, 11:02 AM  Clinical Narrative:               [name] w patient at bedside. She states that she lives at home with her brother. Denies transportation issues.   HH-Patient has wound to buttocks, brother cannot assist with wound care, referral made to [facility] charity [facility] provider this week to review for potential [facility] RN if they are able to accept case. If they are unable to accept patient would prefer to manage wound herself. Wound has thickness and this would not be ideal.   Update-[facility] able to accept for [facility] RN, will need HH RN order  Insurance-Patient states that she has disability, email sent to financial counselor to assess for medicaid.  Patient states that she used to go to [facility] when she had insurance.   Meds/ PCP-She verbalized difficulty paying for meds since she has been uninsured. Patient agreed to referral to [facility] to be able to utilize low cost pharmacy at DC.CM requested CMA to schedule.  Please send meds for DC to [facility] pharmacy. CM will enter MATCH on day of DC.       Expected Discharge Plan: Home/Self Care Barriers to Discharge: Continued Medical Work up   Patient Goals and CMS Choice Patient states their goals for this hospitalization and ongoing recovery are:: return home      Expected Discharge Plan and Services Expected Discharge Plan: Home/Self Care   Discharge Planning Services: CM Consult   Living arrangements for the past 2 months: Apartment                 DME Arranged: N/A                    Prior Living Arrangements/Services Living arrangements for the past 2 months: Apartment Lives with:: Siblings                   Activities of Daily Living      Permission Sought/Granted                   Emotional Assessment              Admission diagnosis:  Hypokalemia [E87.6] Thrombocytopenia (HCC) [D69.6] AKI (acute kidney injury) (HCC) [N17.9] Acute renal failure, unspecified acute renal failure type (HCC) [N17.9] Anemia, unspecified type [D64.9] 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] Patient Active Problem List   Diagnosis Date Noted   Metabolic acidosis 04/19/2022   Hypokalemia 04/19/2022   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HCC) 04/19/2022   Acute respiratory failure with hypoxia (HCC) 04/19/2022   Aspiration pneumonia (HCC) 04/19/2022   Anasarca 04/19/2022   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HCC) 04/19/2022   Marginal ulcer 04/19/2022   Acute on chronic anemia 04/17/2022   Hypovolemic shock (HCC) 04/17/2022   Thrombocytopenia (HCC) 04/17/2022   Alcoholic hepatitis 04/17/2022   Gastroenteritis due to norovirus 04/17/2022   AKI (acute kidney injury) (HCC) 04/10/2022   Iron deficiency anemia 07/07/2021   Acute cholecystitis due to biliary calculus 11/22/2019   Enlarged lymph nodes in armpit 08/01/2012   PCP:  Patient, No Pcp Per Pharmacy:   [id number] - [place], [place] - [address] [place] [address]  Phone: [phone] Fax: [phone]  [facility] [address] [place] [address] Phone: [phone] Fax: [phone]     Social Determinants of Health (SDOH) Interventions    Readmission Risk Interventions     No data to display

## 2022-04-19 NOTE — Progress Notes (Signed)
  Progress Note   Patient: [name] ZOX:[id number] DOB: 08/27/1976 DOA: 04/09/2022     9 DOS: the patient was seen and examined on 04/19/2022 at 11:37AM      Brief hospital course: [name] is a 46 y.o. female with a history of tobacco use, GERD, anemia, s/p bariatric surgery, hypertension. Patient presented secondary to fatigue and dizziness and found to have hypotension and AKI requiring initiation of vasopressors in addition to IV fluids for resuscitation. Patient with evidence of alcoholic hepatitis, started on prednisolone. Possible GI bleeding; GI consulted and performed EGD on 7/25 revealing a non-bleeding ulcer.      Assessment and Plan: * Hypovolemic shock (HCC) Resolved.  Gastroenteritis due to norovirus Resolved  Alcoholic hepatitis - Continue prednisolone 30 days - Follow up with GI - Complete alcohol abstinence  Marginal ulcer - Continue PPI and sucralfate - Follow up with GI  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HCC) Improved  Anasarca CXR shows some mild edema.    Clinically improved. - IV Lasix and trend Cr  Aspiration pneumonia (HCC) - Continue Augmentin   Stage IV pressure ulcer of sacral region [facility]) - WOC consult  Hypokalemia - Supplement K  Thrombocytopenia (HCC) TTP/HUS ruled out.  Acute on chronic anemia Hgb stable  AKI (acute kidney injury) (HCC) Cr improved to 1.7 today.          Subjective: No dyspnea or orthopnea.  She is tired.  No confusion.  No fever.  No drainage from sacral ulcer.  No symptoms of GI hemorrhage.  She is still tired, swollen and jaundiced.     Physical Exam: Vitals:   04/19/22 0500 04/19/22 0622 04/19/22 0759 04/19/22 1541  BP:  (!) 103/50 (!) 105/52 119/62  Pulse:  73 79 79  Resp:  18 17 18   Temp:  97.9 F (36.6 C) 98.2 F (36.8 C)   TempSrc:  Oral Oral   SpO2:  93%  100%  Weight: 101.4 kg     Height:       Adult female, sitting in recliner, interactive, no acute distress RRR, no murmurs,  2+ lower extremity edema Respiratory rate normal, no rales, no wheezes Abdomen soft no tenderness palpation or guarding Attention normal, affect appropriate, judgment and insight appear normal    Data Reviewed: Basic metabolic panel shows creatinine down to 1.7, sodium potassium normal AST and ALT slightly improved down to 134 and 102 Total bilirubin down to 7.9 Hemoglobin down to 7.4, macrocytic, platelets 48, no change, white blood cell count improved Discussed with GI    Family Communication: Mother and friend at the bedside    Disposition: Status is: Inpatient         Author: , MD 04/19/2022 4:01 PM  For on call review [email or web address].

## 2022-04-19 NOTE — Assessment & Plan Note (Signed)
-   WOC consult

## 2022-04-19 NOTE — Progress Notes (Addendum)
Daily Rounding Note  04/19/2022, 10:37 AM  LOS: 9 days   SUBJECTIVE:   Chief complaint: Anastomotic ulcers.  Weakness.  Jaundice.  Denies abdominal pain.  Tolerating solid food.  No nausea.  Bowel movements are dark.  OBJECTIVE:         Vital signs in last 24 hours:    Temp:  [97.6 F (36.4 C)-98.7 F (37.1 C)] 98.2 F (36.8 C) (07/26 0759) Pulse Rate:  [72-84] 79 (07/26 0759) Resp:  [17-18] 17 (07/26 0759) BP: (101-105)/(50-60) 105/52 (07/26 0759) SpO2:  [91 %-93 %] 93 % (07/26 0622) Weight:  [101.4 kg] 101.4 kg (07/26 0500) Last BM Date : 04/18/22 Filed Weights   04/17/22 0343 04/18/22 0500 04/19/22 0500  Weight: 103.7 kg 100.6 kg 101.4 kg   Spoke with patient in the room but did not physically/manually reexamine patient. General: Obese.  NAD. Heart: RRR Chest: No labored breathing or cough Abdomen: Obese Extremities: Minor lower extremity swelling. Neuro/Psych: Oriented x3.  No obvious tremors.  Standing without assistance.  Intake/Output from previous day: 07/25 0701 - 07/26 0700 In: 1250 [P.O.:900; I.V.:350] Out: -   Intake/Output this shift: No intake/output data recorded.  Lab Results: Recent Labs    04/17/22 0822 04/18/22 0303 04/19/22 0323  WBC 12.5* 14.4* 13.8*  HGB 8.0* 8.4* 7.4*  HCT 23.0* 24.5* 21.5*  PLT 50* 60* 48*   BMET Recent Labs    04/17/22 0822 04/18/22 0303 04/19/22 0323  NA 139 138 139  K 3.7 4.1 4.1  CL 107 106 109  CO2 _0 GLUCOSE 124* 133* 125*  BUN 45* 45* 40*  CREATININE 1.97* 1.82* 1.69*  CALCIUM 7.9* 8.4* 8.6*   LFT Recent Labs    04/17/22 0822 04/18/22 0303 04/19/22 0323  PROT 6.8 7.2 6.5  ALBUMIN 2.3* 2.5* 2.2*  AST 131* 150* 134*  ALT 81* 104* 102*  ALKPHOS 95 109 101  BILITOT 9.5* 8.9* 7.9*   PT/INR Recent Labs    04/17/22 0822  LABPROT 19.4*  INR 1.7*   Hepatitis Panel No results for input(s): "HEPBSAG", "HCVAB", "HEPAIGM",  "HEPBIGM" in the last 72 hours.  Studies/Results: No results found.  Scheduled Meds:  amoxicillin-clavulanate  1 tablet Oral Q12H   Chlorhexidine Gluconate Cloth  6 each Topical Daily   dextromethorphan  30 mg Oral BID   famotidine  20 mg Oral Daily   folic acid  2 mg Oral Daily   Gerhardt's butt cream   Topical BID   insulin aspart  0-15 Units Subcutaneous TID WC   insulin aspart  0-5 Units Subcutaneous QHS   pantoprazole  40 mg Oral BID AC   prednisoLONE  40 mg Oral Daily   sodium chloride flush  10-40 mL Intracatheter Q12H   sucralfate  1 g Oral Q6H   Continuous Infusions: PRN Meds:.albuterol, docusate sodium, guaiFENesin-dextromethorphan, loperamide, ondansetron (ZOFRAN) IV, mouth rinse, oxyCODONE, polyethylene glycol, sodium chloride, sodium chloride flush   ASSESMENT:     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  1 of 1 FOB test was negative here but suspect chronic intermittent bleeding. 04/18/2022 EGD: Esophageal plaques, suspicious for candidiasis, biopsies pending.  Small HH.  Marginal ulcer of moderate size with no stigmata of bleeding at Roux-en-Y gastrojejunostomy anastomosis. Continues on twice daily oral PPI, sucralfate.    ABL and chronic anemia.    Large sacral decubitus ulcer.  Moderate internal hemorrhoids noted on physical exam.  Fatty liver.  Jaundice with  T. bili 7.9.  Alk phos 101.  AST/ALT 134/102: Consistent with alcoholic hepatitis.  Day 8 prednisolone started by hospitalist, DF score 36 on 7/18. Lille score is 0.34, c/w good response and good prognosis.     Acute pancreatitis suggested by CT, lipase mid 200s.  No issues tolerating food and no abdominal pain.  Asp pneumonia.  Unasyn ... Augmentin.  04/10/2022 abdominal ultrasound shows diffuse fatty liver, normal Dopplers.  Unable to identify common bile duct.  Previous cholecystectomy.  He confirms severe fatty liver, mild acute pancreatitis in region of uncinate.    AKI.  Improved.     PLAN    Protonix 40 mg po bid.   sucralfate 1 g po qid for 2 weeks.  Stop prednisolone after total 30 days treatment.  That should be somewhere around 8/19  Stopped drinking ETOH.  GI signing off. Needs fup CBC w PCP office in next 7 to 10 days.  Given her circumstances she probably needs CBCs every few months at a shorter than usual interval given her situation.  Needs to arrange follow-up with his GI at [facility].  GI MD there is Dr. Dewaine Oats at [facility]  Phone [phone].  Dr Lyndel Safe does not plan to follow pt.    Although she is not iron deficient currently, she is going to need additional iron to address what is likely to be future bleeding from these anastomotic ulcers so adding once daily oral iron.    [name]  04/19/2022, 10:37 AM Phone [phone]     Attending physician's note   I have taken history, reviewed the chart and examined the patient. I performed a substantive portion of this encounter, including complete performance of at least one of the key components, in conjunction with the APP. I agree with the Advanced Practitioner's note, impression and recommendations.   Feels better. No N/V/abdo pain  Recurrent marginal ulcers. S/P RYGB 2010, then revision 2014 d/t marginal ulcers. ETOH hepatitis (DF/Lillie c/w  prednisolone benefit) Sacral ulcer  Plan: -Protonix 40 BID -Carafate 1 g p.o. 4 times daily x  2 weeks. -Continue prednisolone for total of 30 days -Stop ETOH and smoking -No nonsteroidals. -FU GI @ [facility] (Contact numbers as above) -D/W pt and pt's friend (at bedside) -Will sign off.   Carmell Austria, MD [facility] [phone]

## 2022-04-19 NOTE — Assessment & Plan Note (Signed)
-   Continue Augmentin day 7 of 7

## 2022-04-19 NOTE — Hospital Course (Signed)
[name] is a 47 y.o. female with a history of tobacco use, GERD, anemia, s/p bariatric surgery, hypertension. Patient presented secondary to fatigue and dizziness and found to have hypotension and AKI requiring initiation of vasopressors in addition to IV fluids for resuscitation. Patient with evidence of alcoholic hepatitis, started on prednisolone. Possible GI bleeding; GI consulted and performed EGD on 7/25 revealing a non-bleeding ulcer.

## 2022-04-19 NOTE — Assessment & Plan Note (Signed)
Resolved with supplementation and starting spironolactone. 

## 2022-04-20 LAB — COMPREHENSIVE METABOLIC PANEL
ALT: 112 U/L — ABNORMAL HIGH (ref 0–44)
AST: 127 U/L — ABNORMAL HIGH (ref 15–41)
Albumin: 2.2 g/dL — ABNORMAL LOW (ref 3.5–5.0)
Alkaline Phosphatase: 100 U/L (ref 38–126)
Anion gap: 10 (ref 5–15)
BUN: 38 mg/dL — ABNORMAL HIGH (ref 6–20)
CO2: 24 mmol/L (ref 22–32)
Calcium: 8.9 mg/dL (ref 8.9–10.3)
Chloride: 104 mmol/L (ref 98–111)
Creatinine, Ser: 1.54 mg/dL — ABNORMAL HIGH (ref 0.44–1.00)
GFR, Estimated: 42 mL/min — ABNORMAL LOW (ref >60)
Glucose, Bld: 125 mg/dL — ABNORMAL HIGH (ref 70–99)
Potassium: 3.9 mmol/L (ref 3.5–5.1)
Sodium: 138 mmol/L (ref 135–145)
Total Bilirubin: 7.6 mg/dL — ABNORMAL HIGH (ref 0.3–1.2)
Total Protein: 6.5 g/dL (ref 6.5–8.1)

## 2022-04-20 LAB — CBC WITH DIFFERENTIAL/PLATELET
Abs Immature Granulocytes: 0.36 10*3/uL — ABNORMAL HIGH (ref 0.00–0.07)
Basophils Absolute: 0 10*3/uL (ref 0.0–0.1)
Basophils Relative: 0 %
Eosinophils Absolute: 0 10*3/uL (ref 0.0–0.5)
Eosinophils Relative: 0 %
HCT: 21.7 % — ABNORMAL LOW (ref 36.0–46.0)
Hemoglobin: 7.2 g/dL — ABNORMAL LOW (ref 12.0–15.0)
Immature Granulocytes: 2 %
Lymphocytes Relative: 14 %
Lymphs Abs: 2.1 10*3/uL (ref 0.7–4.0)
MCH: 37.3 pg — ABNORMAL HIGH (ref 26.0–34.0)
MCHC: 33.2 g/dL (ref 30.0–36.0)
MCV: 112.4 fL — ABNORMAL HIGH (ref 80.0–100.0)
Monocytes Absolute: 0.9 10*3/uL (ref 0.1–1.0)
Monocytes Relative: 6 %
Neutro Abs: 11.6 10*3/uL — ABNORMAL HIGH (ref 1.7–7.7)
Neutrophils Relative %: 78 %
Platelets: 53 10*3/uL — ABNORMAL LOW (ref 150–400)
RBC: 1.93 MIL/uL — ABNORMAL LOW (ref 3.87–5.11)
Smear Review: NORMAL
WBC: 15.1 10*3/uL — ABNORMAL HIGH (ref 4.0–10.5)
nRBC: 0.8 % — ABNORMAL HIGH (ref 0.0–0.2)

## 2022-04-20 LAB — GLUCOSE, CAPILLARY
Glucose-Capillary: 103 mg/dL — ABNORMAL HIGH (ref 70–99)
Glucose-Capillary: 118 mg/dL — ABNORMAL HIGH (ref 70–99)
Glucose-Capillary: 188 mg/dL — ABNORMAL HIGH (ref 70–99)
Glucose-Capillary: 122 mg/dL — ABNORMAL HIGH (ref 70–99)

## 2022-04-20 LAB — MAGNESIUM: Magnesium: 2 mg/dL (ref 1.7–2.4)

## 2022-04-20 MED ORDER — FUROSEMIDE 40 MG PO TABS
40.0000 mg | ORAL_TABLET | Freq: Every day | ORAL | Status: DC
  Administered 2022-04-20 – 2022-04-21 (×2): 40 mg via ORAL
  Filled 2022-04-20 (×2): qty 1

## 2022-04-20 NOTE — Progress Notes (Signed)
  Progress Note   Patient: [name] KVQ:[id number] DOB: Feb 27, 1976 DOA: 04/09/2022     10 DOS: the patient was seen and examined on 04/20/2022 at 11:45AM      Brief hospital course: [name] is a 46 y.o. female with a history of tobacco use, GERD, anemia, s/p bariatric surgery, hypertension. Patient presented secondary to fatigue and dizziness and found to have hypotension and AKI requiring initiation of vasopressors in addition to IV fluids for resuscitation. Patient with evidence of alcoholic hepatitis, started on prednisolone. Possible GI bleeding; GI consulted and performed EGD on 7/25 revealing a non-bleeding ulcer.      Assessment and Plan: * Hypovolemic shock (HCC) Resolved.  Gastroenteritis due to norovirus Resolved  Alcoholic hepatitis - Continue prednisolone 30 days until Aug 18 - Follow up with GI - Complete alcohol abstinence  Obesity (BMI 30-39.9) BMI 36  Marginal ulcer - Continue PPI and sucralfate - Follow up with GI  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HCC) Improved  Anasarca Still very swollen today, improved slightly with Lasix yesterday.  CXR with edema yesterday . Off O2.   - Repeat Lasix - Trend Cr    Aspiration pneumonia (HCC) - Continue Augmentin day 7 of 7  Stage IV pressure ulcer of sacral region [facility]) - WOC consult  Hypokalemia - Supplement K  Thrombocytopenia (HCC) TTP/HUS ruled out.  Acute on chronic anemia Hgb stable  AKI (acute kidney injury) (HCC) Cr sliughtly better with diuresis.          Subjective: Patient still has swelling but this is better with Lasix and the compression stockings, no chest pain, orthopnea, confusion.     Physical Exam: Vitals:   04/19/22 1541 04/19/22 1949 04/20/22 0527 04/20/22 0835  BP: 119/62 (!) 102/55 94/64 (!) 103/43  Pulse: 79 76 69 70  Resp: 18 15 18 18   Temp:  98.5 F (36.9 C) 98.5 F (36.9 C) 98.2 F (36.8 C)  TempSrc:  Oral Oral Oral  SpO2: 100% 97% 93% 100%  Weight:    102.9 kg   Height:       Adult female, lying in bed, no acute distress, interactive She remains fairly jaundiced RRR, no murmurs, 3+ lower extremity peripheral edema Respiratory rate normal, lungs clear without rales or wheezes Abdomen soft no tenderness palpation or guarding, no ascites or distention Normal, affect appropriate, judgment insight appear normal   Data Reviewed: Patient metabolic panel notable for creatinine down to 1.5, glucose good Sodium potassium normal LFTs and total bilirubin slightly better, white blood cell count 15, hemoglobin trended down to 7.2       Disposition: Status is: Inpatient         Author: , MD 04/20/2022 12:03 PM  For on call review [email or web address].

## 2022-04-20 NOTE — TOC Transition Note (Signed)
Transition of Care [facility]) - CM/SW Discharge Note   Patient Details  Name: [name] MRN: [id number] Date of Birth: 04/07/1976  Transition of Care [facility]) CM/SW Contact:  Lockie Pares, RN Phone Number: 04/20/2022, 11:59 AM   Clinical Narrative:     Patient has been set up with [facility] for charity care RN. Patient will DC today. Cory aware. No further needs.     Barriers to Discharge: Continued Medical Work up   Patient Goals and CMS Choice Patient states their goals for this hospitalization and ongoing recovery are:: return home      Discharge Placement               Home with home health        Discharge Plan and Services   Discharge Planning Services: CM Consult            DME Arranged: N/A         HH Arranged: RN HH Agency: [facility] Date [facility] Agency Contacted: 04/20/22 Time HH Agency Contacted: 1159 Representative spoke with at [facility] Agency: Kandee Keen  Social Determinants of Health (SDOH) Interventions     Readmission Risk Interventions     No data to display

## 2022-04-21 ENCOUNTER — Other Ambulatory Visit: Payer: Self-pay

## 2022-04-21 DIAGNOSIS — B3781 Candidal esophagitis: Secondary | ICD-10-CM

## 2022-04-21 LAB — CBC WITH DIFFERENTIAL/PLATELET
Abs Immature Granulocytes: 0.29 10*3/uL — ABNORMAL HIGH (ref 0.00–0.07)
Basophils Absolute: 0 10*3/uL (ref 0.0–0.1)
Basophils Relative: 0 %
Eosinophils Absolute: 0 10*3/uL (ref 0.0–0.5)
Eosinophils Relative: 0 %
HCT: 20.4 % — ABNORMAL LOW (ref 36.0–46.0)
Hemoglobin: 7 g/dL — ABNORMAL LOW (ref 12.0–15.0)
Immature Granulocytes: 2 %
Lymphocytes Relative: 11 %
Lymphs Abs: 1.8 10*3/uL (ref 0.7–4.0)
MCH: 38.3 pg — ABNORMAL HIGH (ref 26.0–34.0)
MCHC: 34.3 g/dL (ref 30.0–36.0)
MCV: 111.5 fL — ABNORMAL HIGH (ref 80.0–100.0)
Monocytes Absolute: 0.9 10*3/uL (ref 0.1–1.0)
Monocytes Relative: 6 %
Neutro Abs: 13 10*3/uL — ABNORMAL HIGH (ref 1.7–7.7)
Neutrophils Relative %: 81 %
Platelets: 47 10*3/uL — ABNORMAL LOW (ref 150–400)
RBC: 1.83 MIL/uL — ABNORMAL LOW (ref 3.87–5.11)
WBC: 16.1 10*3/uL — ABNORMAL HIGH (ref 4.0–10.5)
nRBC: 0.6 % — ABNORMAL HIGH (ref 0.0–0.2)

## 2022-04-21 LAB — COMPREHENSIVE METABOLIC PANEL
ALT: 116 U/L — ABNORMAL HIGH (ref 0–44)
AST: 121 U/L — ABNORMAL HIGH (ref 15–41)
Albumin: 2.1 g/dL — ABNORMAL LOW (ref 3.5–5.0)
Alkaline Phosphatase: 94 U/L (ref 38–126)
Anion gap: 7 (ref 5–15)
BUN: 34 mg/dL — ABNORMAL HIGH (ref 6–20)
CO2: 24 mmol/L (ref 22–32)
Calcium: 8.6 mg/dL — ABNORMAL LOW (ref 8.9–10.3)
Chloride: 106 mmol/L (ref 98–111)
Creatinine, Ser: 1.61 mg/dL — ABNORMAL HIGH (ref 0.44–1.00)
GFR, Estimated: 40 mL/min — ABNORMAL LOW (ref >60)
Glucose, Bld: 131 mg/dL — ABNORMAL HIGH (ref 70–99)
Potassium: 3.6 mmol/L (ref 3.5–5.1)
Sodium: 137 mmol/L (ref 135–145)
Total Bilirubin: 6.9 mg/dL — ABNORMAL HIGH (ref 0.3–1.2)
Total Protein: 6.4 g/dL — ABNORMAL LOW (ref 6.5–8.1)

## 2022-04-21 LAB — GLUCOSE, CAPILLARY
Glucose-Capillary: 141 mg/dL — ABNORMAL HIGH (ref 70–99)
Glucose-Capillary: 130 mg/dL — ABNORMAL HIGH (ref 70–99)
Glucose-Capillary: 169 mg/dL — ABNORMAL HIGH (ref 70–99)
Glucose-Capillary: 156 mg/dL — ABNORMAL HIGH (ref 70–99)

## 2022-04-21 LAB — PREPARE RBC (CROSSMATCH)

## 2022-04-21 MED ORDER — FUROSEMIDE 20 MG PO TABS
20.0000 mg | ORAL_TABLET | Freq: Every day | ORAL | Status: DC
Start: 2022-04-22 — End: 2022-04-22
  Filled 2022-04-21: qty 1

## 2022-04-21 MED ORDER — DARBEPOETIN ALFA 150 MCG/0.3ML IJ SOSY
150.0000 ug | PREFILLED_SYRINGE | Freq: Once | INTRAMUSCULAR | Status: AC
  Administered 2022-04-21: 150 ug via SUBCUTANEOUS
  Filled 2022-04-21: qty 0.3

## 2022-04-21 MED ORDER — FLUCONAZOLE 100 MG PO TABS
200.0000 mg | ORAL_TABLET | Freq: Once | ORAL | Status: AC
  Administered 2022-04-21: 200 mg via ORAL
  Filled 2022-04-21: qty 2

## 2022-04-21 MED ORDER — MAGIC MOUTHWASH
15.0000 mL | Freq: Four times a day (QID) | ORAL | Status: DC | PRN
  Administered 2022-04-21 – 2022-04-22 (×2): 15 mL via ORAL
  Filled 2022-04-21 (×3): qty 15

## 2022-04-21 MED ORDER — FLUCONAZOLE 100 MG PO TABS: ORAL_TABLET | ORAL | 0 refills | Status: DC

## 2022-04-21 MED ORDER — SODIUM CHLORIDE 0.9% IV SOLUTION: Freq: Once | INTRAVENOUS | Status: DC

## 2022-04-21 NOTE — Progress Notes (Signed)
Mobility Specialist - Progress Note   04/21/22 1100  Mobility  Activity Ambulated independently in hallway  Level of Assistance Independent  Assistive Device None  Distance Ambulated (ft) 550 ft  Activity Response Tolerated well  $Mobility charge 1 Mobility   Pt received in bed and agreeable to mobility. SpO2 stayed 96% throughout ambulation. Pt left in bed with all needs met and call bell by side.   Paulla Dolly Mobility Specialist

## 2022-04-21 NOTE — Progress Notes (Signed)
  Progress Note   Patient: [name] PYP:[id number] DOB: 03/15/1976 DOA: 04/09/2022     11 DOS: the patient was seen and examined on 04/21/2022 at 11:45AM      Brief hospital course: [name] is a 46 y.o. female with a history of tobacco use, GERD, anemia, s/p bariatric surgery, hypertension. Patient presented secondary to fatigue and dizziness and found to have hypotension and AKI requiring initiation of vasopressors in addition to IV fluids for resuscitation. Patient with evidence of alcoholic hepatitis, started on prednisolone. Possible GI bleeding; GI consulted and performed EGD on 7/25 revealing a non-bleeding ulcer.      Assessment and Plan: * Acute on chronic anemia B12 replete in April.  Iron replete in April and iron studies consistent with inflammation in this admission.  No signs of hemolysis.  She has hemorrhoids and likely some slow GI blood loss from her marginal ulcer, but no frank blood loss in several days and GI deferred colonoscopy.  Likely also bone marrow suppression from alcohol contributing. Hematology have signed off, but recommended EPO and recmomended repeating EPO if needed. - Repeat Aranesp - Transfuse 1 unit  Alcoholic hepatitis - Continue prednisolone 30 days until Aug 18 - Follow up with GI - Complete alcohol abstinence  Obesity (BMI 30-39.9) BMI 36  Marginal ulcer - Continue PPI and sucralfate - Follow up with GI   Anasarca Swollen still, hypoalbuminemic. - Continue Lasix PO - Trend Cr    Aspiration pneumonia (HCC) Completed antibiotics            Subjective: Still with swelling, overall very tired, no clinical bleeding    Physical Exam: Vitals:   04/21/22 0834 04/21/22 1331 04/21/22 1413 04/21/22 1431  BP: (!) 108/56 (!) 100/54 (!) 90/50 98/60  Pulse: 75 77 70 75  Resp: 17 17 17 18   Temp: 98.1 F (36.7 C) 97.8 F (36.6 C) 98 F (36.7 C) 98 F (36.7 C)  TempSrc: Oral Oral Oral Oral  SpO2: 99% 98% 96% 96%   Weight:      Height:       Adult female, lying in bed, no acute distress, interactive She remains fairly jaundiced RRR, no murmurs, 3+ lower extremity peripheral edema Respiratory rate normal, lungs clear without rales or wheezes Abdomen soft no tenderness palpation or guarding, no ascites or distention Normal, affect appropriate, judgment insight appear normal   Data Reviewed: Hemogram shows hemoglobin down to 7       Disposition: Status is: Inpatient         Author: , MD 04/21/2022 3:36 PM  For on call review [email or web address].

## 2022-04-22 LAB — CBC
HCT: 25.7 % — ABNORMAL LOW (ref 36.0–46.0)
Hemoglobin: 8.6 g/dL — ABNORMAL LOW (ref 12.0–15.0)
MCH: 35.1 pg — ABNORMAL HIGH (ref 26.0–34.0)
MCHC: 33.5 g/dL (ref 30.0–36.0)
MCV: 104.9 fL — ABNORMAL HIGH (ref 80.0–100.0)
Platelets: 61 10*3/uL — ABNORMAL LOW (ref 150–400)
RBC: 2.45 MIL/uL — ABNORMAL LOW (ref 3.87–5.11)
WBC: 20.8 10*3/uL — ABNORMAL HIGH (ref 4.0–10.5)
nRBC: 0.4 % — ABNORMAL HIGH (ref 0.0–0.2)

## 2022-04-22 LAB — TYPE AND SCREEN
ABO/RH(D): B POS
Antibody Screen: NEGATIVE
Unit division: 0

## 2022-04-22 LAB — COMPREHENSIVE METABOLIC PANEL
ALT: 142 U/L — ABNORMAL HIGH (ref 0–44)
AST: 147 U/L — ABNORMAL HIGH (ref 15–41)
Albumin: 2.5 g/dL — ABNORMAL LOW (ref 3.5–5.0)
Alkaline Phosphatase: 96 U/L (ref 38–126)
Anion gap: 8 (ref 5–15)
BUN: 29 mg/dL — ABNORMAL HIGH (ref 6–20)
CO2: 24 mmol/L (ref 22–32)
Calcium: 8.9 mg/dL (ref 8.9–10.3)
Chloride: 106 mmol/L (ref 98–111)
Creatinine, Ser: 1.58 mg/dL — ABNORMAL HIGH (ref 0.44–1.00)
GFR, Estimated: 41 mL/min — ABNORMAL LOW (ref >60)
Glucose, Bld: 128 mg/dL — ABNORMAL HIGH (ref 70–99)
Potassium: 3.6 mmol/L (ref 3.5–5.1)
Sodium: 138 mmol/L (ref 135–145)
Total Bilirubin: 9.7 mg/dL — ABNORMAL HIGH (ref 0.3–1.2)
Total Protein: 7.3 g/dL (ref 6.5–8.1)

## 2022-04-22 LAB — BPAM RBC
Blood Product Expiration Date: 202308212359
ISSUE DATE / TIME: 202307281327
Unit Type and Rh: 7300

## 2022-04-22 LAB — GLUCOSE, CAPILLARY
Glucose-Capillary: 116 mg/dL — ABNORMAL HIGH (ref 70–99)
Glucose-Capillary: 172 mg/dL — ABNORMAL HIGH (ref 70–99)

## 2022-04-22 MED ORDER — FERROUS SULFATE 325 (65 FE) MG PO TABS: 325.0000 mg | ORAL_TABLET | Freq: Every day | ORAL | 3 refills | Status: DC

## 2022-04-22 MED ORDER — SUCRALFATE 1 GM/10ML PO SUSP: 1.0000 g | Freq: Four times a day (QID) | ORAL | 0 refills | Status: DC

## 2022-04-22 MED ORDER — PREDNISOLONE 5 MG PO TABS: 40.0000 mg | ORAL_TABLET | Freq: Every day | ORAL | 0 refills | Status: DC

## 2022-04-22 MED ORDER — FOLIC ACID 1 MG PO TABS: 1.0000 mg | ORAL_TABLET | Freq: Every day | ORAL | 6 refills | Status: DC

## 2022-04-22 MED ORDER — PANTOPRAZOLE SODIUM 40 MG PO TBEC: 40.0000 mg | DELAYED_RELEASE_TABLET | Freq: Two times a day (BID) | ORAL | 1 refills | Status: DC

## 2022-04-22 MED ORDER — FLUCONAZOLE 200 MG PO TABS: 200.0000 mg | ORAL_TABLET | Freq: Every day | ORAL | 0 refills | Status: DC

## 2022-04-22 MED ORDER — LOPERAMIDE HCL 2 MG PO CAPS: 2.0000 mg | ORAL_CAPSULE | ORAL | 0 refills | Status: DC | PRN

## 2022-04-22 NOTE — Progress Notes (Signed)
Mobility Specialist Progress Note:   04/22/22 0900  Mobility  Activity Ambulated independently in room  Level of Assistance Independent  Assistive Device None  Distance Ambulated (ft) 50 ft  Activity Response Tolerated well  $Mobility charge 1 Mobility   Pt ambulating in room independently upon arrival. No SOB noted. Will f/u for hopeful hallway ambulation after pt bath.   [name] Acute Rehab Secure Chat or Office Phone: [phone]

## 2022-04-22 NOTE — TOC Transition Note (Addendum)
Transition of Care [facility]) - CM/SW Discharge Note   Patient Details  Name: [name] MRN: [id number] Date of Birth: 1975/10/26  Transition of Care [facility]) CM/SW Contact:  Bess Kinds, RN Phone Number: [phone] 04/22/2022, 2:49 PM   Clinical Narrative:     Spoke with patient at the bedside to discuss post acute transition. Patient without insurance - provided MATCH for DC medications. Override for copays on eligible medications. Explained to patient that OTC, vitamins, minerals, and controlled  medications are not eligible for [facility]. Advised that eligibility for MATCH is once a year. Patient to f/u with [facility] for hospital f/u and to establish for primary care. HH liaison for [employer] notified of transition home today. Patient has arranged ride for transportation home. No further TOC needs identified.   UPDATE: [facility] call from [facility] that prescriptions for flucanozole and prednisolone were not received. MD notified to resend prescriptions.   Final next level of care: Home w Home Health Services Barriers to Discharge: No Barriers Identified   Patient Goals and CMS Choice Patient states their goals for this hospitalization and ongoing recovery are:: home CMS Medicare.gov Compare Post Acute Care list provided to:: Patient Choice offered to / list presented to : Patient  Discharge Placement                       Discharge Plan and Services   Discharge Planning Services: CM Consult            DME Arranged: N/A DME Agency: NA       HH Arranged: RN HH Agency: [facility] Date [facility] Agency Contacted: 04/22/22 Time HH Agency Contacted: 1200 Representative spoke with at [facility] Agency: Kandee Keen  Social Determinants of Health (SDOH) Interventions     Readmission Risk Interventions     No data to display

## 2022-04-22 NOTE — Progress Notes (Signed)
During my assessment, asked the patient if I can look at her bottom, per report patient was doing her own treatment on her sacral wound. Patient was very hesitant saying this is so embarrassing. Patient advised that this is what we are here for to check on her and that she is being taken care of. Upon checking the wound I noticed a abdominal pad on the wound, Wound bed with stool and when asked what she is doing she said she just put dry dressing on it. Patient when to the bathroom and wash the wound bed, wet to dry dressing applied and wound consult placed. Md made aware , paged wound care because per MD patient may go home today.

## 2022-04-22 NOTE — Discharge Summary (Signed)
Physician Discharge Summary   Patient: [name] MRN: [id number] DOB: 05-14-1976  Admit date:     04/09/2022  Discharge date: 04/22/22  Discharge Physician: Edwin Dada   PCP: Kerin Perna, NP     Recommendations at discharge:  Follow up with Juluis Mire, NP in 1 week Juluis Mire:  Please check CMP and CBC Please evaluate sacral wound and if patient assistance program for Wound Clinic is possible, make referral Please check ferritin, B12, folate in 1 month  3. Follow up with [facility] as soon as able     Discharge Diagnoses: Principal Problem:   Hypovolemic shock ([place]) Active Problems:   Alcoholic hepatitis   Gastroenteritis due to norovirus   AKI (acute kidney injury) ([place])   Acute on chronic anemia   Thrombocytopenia (HCC)   Metabolic acidosis   Hypokalemia   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[place])   Acute respiratory failure with hypoxia (HCC)   Aspiration pneumonia (HCC)   Anasarca   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HCC)   Marginal ulcer   Obesity (BMI 30-39.9)         Hospital Course: [name] is a 46 y.o. F with hx bariatric surgery, HTN who had had several days severe vomiting and diarrhea, and presented with fatigue and dizziness and found to have hypotension, AKI and transaminitis.  Admitted to ICU on pressors.         * Hypovolemic shock due to gastroenteritis due to norovirus Admitted to ICU on pressors.  Treated with fluids and stabilized.    Alcoholic hepatitis Noted to have Tbili 13.2, elevated LFTs, INR 1.8 in setting of alcohol use.  ANA negative, hepatitis serologies negative.   Evaluated by GI who suspected alcoholic hepatitis, started prednisolone.    She had improvement in her Tbili and so she was discharged on prednisolone 40 mg daily for 30 days, to end 8/17.  Discharge Tbili 9.7, AST/ALT 147/142   Anemia and thrombocytopenia She was evaluated by hematology and GI.  Had no evidence of  hemolysis.   EGD showed a marginal ulcer without stigmata of bleeding.  Colonoscopy deferred.  Her anemia and thrombocytopenia were suspected to be from slow blood loss (hemorrhoids and marginal ulcer in setting of thrombocytopenia) and malproduction in the setting of bone marrow suppression from alcohol.  Hopefully, her liver function will improve and these will normalize.  Transfused 4 units during hospital stay.  Discharged on folate, B12, iron as well as PPI and sucralfate.  Needs close GI follow up.  Discharge Hgb 8.6, WBC 20.8 (in setting of steroids) and Platelets 61K    AKI (acute kidney injury) ([place]) Prior creatinine baseline was 0.9 mg/dL, on admission was 6.5 mg/dL, improved gradually and stabilized at 1.6 mg/dL at discharge.   Stage IV pressure ulcer of sacral region [facility]) Home health was arranged.  Due to lack of insurance, SW were unable to secure referral to the wound care center.  This did not appear infected at the time of discharge.            The [facility] Controlled Substances Registry was reviewed for this patient prior to discharge.   Consultants: GI, Critical Care, Hematology Procedures performed:  - Upper endoscopy   Disposition: Home health   DISCHARGE MEDICATION: Allergies as of 04/22/2022       Reactions   Tape Rash   Clear tape        Medication List     STOP taking these medications  gabapentin 300 MG capsule Commonly known as: Neurontin   valsartan 80 MG tablet Commonly known as: DIOVAN       TAKE these medications    B-12 PO Take 1 tablet by mouth daily.   busPIRone 15 MG tablet Commonly known as: BUSPAR Take 15 mg by mouth 3 (three) times daily as needed (anxiety).   ferrous sulfate 325 (65 FE) MG tablet Take 1 tablet (325 mg total) by mouth daily with breakfast. Start taking on: April 23, 2022   fluconazole 200 MG tablet Commonly known as: Diflucan Take 1 tablet (200 mg total) by mouth daily.   folic  acid 1 MG tablet Commonly known as: FOLVITE Take 1 tablet (1 mg total) by mouth daily.   loperamide 2 MG capsule Commonly known as: IMODIUM Take 1 capsule (2 mg total) by mouth as needed for diarrhea or loose stools.   pantoprazole 40 MG tablet Commonly known as: PROTONIX Take 1 tablet (40 mg total) by mouth 2 (two) times daily before a meal.   prednisoLONE 5 MG Tabs tablet Take 8 tablets (40 mg total) by mouth daily for 19 days.   prednisoLONE 5 MG Tabs tablet Take 8 tablets (40 mg total) by mouth daily for 19 days. Start taking on: April 23, 2022   sertraline 100 MG tablet Commonly known as: ZOLOFT Take 100 mg by mouth as needed.   sucralfate 1 GM/10ML suspension Commonly known as: CARAFATE Take 10 mLs (1 g total) by mouth every 6 (six) hours.               Discharge Care Instructions  (From admission, onward)           Start     Ordered   04/22/22 0000  Discharge wound care:       Comments: Dry gauze over wound to buttock. Cover with mesh underwear.  Discuss with Edwards   04/22/22 1053            Follow-up Information     Nance, Carmine Savoy, NP. Schedule an appointment as soon as possible for a visit.   Specialty: Internal Medicine Why: call for appt w GI to arrange follow up in next 4 to 6 weeks. Contact information: [place] [address] [phone]         Kerin Perna, NP Follow up.   Specialty: Internal Medicine Why: TME : 2:30 PM DATE:  AUGUST 10 ,2023 Juluis Mire , PA LOCATION: [facility] Contact information: [address] [place] [address] [phone]                 Discharge Instructions     Discharge instructions   Complete by: As directed    From Dr. Loleta Books: You were admitted for a severe gastroenteritis, that was made a life-threatening situation by your liver disease  You were admitted to the ICU and needed "pressors" to keep your blood  pressure from dropping to life-threatening low levels.  We found that your had norovirus (which is a common gastroenteritis, and goes away by itself) We also found that you had severe hepatitis, probably from alcohol and severe anemia and thrombocytopenia (low blood platelet levels) that were also due to alcohol and hepatitis.   For the hepatitis: Take the steroid prednisolone 40 mg daily until Aug 18 Follow up with Juluis Mire on Aug 10 and with your GI doctor at [facility] your liver function tests checked at both appointments   For  the anemia and low blood levels: Take iron (ferrous sulfate 325 mg) daily Take folate 1 mg and your B12 vitamin also daily   For the stomach ulcer (which is contributing to the anemia probably): Take sucralfate, pantoprazole as prescribed below Follow up with the GI doctors at [facility]  For the wound on your bottom: Pack the wound with sterile saline gauze and cover with a dry dressing and mesh underwear Change the dressing twice daily Have the Home Health nurse from [place] look at this and give you further advice Show to Dr. Oletta Lamas  For the yeast in your esophagus: Take fluconazole 200 mg once daily for the next 8 days to complete the 10 day course   For the swelling in your legs: Use the compression hose and go see Dr. Oletta Lamas and have her check your labs  Return to the hospital for uncontrolled bleeding, dizziness or passing out, feeling confused or having trouble breathing.   Discharge wound care:   Complete by: As directed    Dry gauze over wound to buttock. Cover with mesh underwear.  Discuss with Edwards   Increase activity slowly   Complete by: As directed        Discharge Exam: Filed Weights   04/19/22 0500 04/20/22 0527 04/22/22 0500  Weight: 101.4 kg 102.9 kg 100.4 kg    General: Pt is alert, awake, not in acute distress, jaundice noted Cardiovascular: RRR, nl S1-S2, no murmurs appreciated.   No LE edema.    Respiratory: Normal respiratory rate and rhythm.  CTAB without rales or wheezes. Abdominal: Abdomen soft and non-tender.  No distension or HSM.   Neuro/Psych: Strength symmetric in upper and lower extremities.  Judgment and insight appear normal.   Condition at discharge: fair  The results of significant diagnostics from this hospitalization (including imaging, microbiology, ancillary and laboratory) are listed below for reference.   Imaging Studies: DG Chest 2 View  Result Date: 04/19/2022 CLINICAL DATA:  Aspiration pneumonia EXAM: CHEST - 2 VIEW COMPARISON:  04/15/2022 FINDINGS: Normal cardiac silhouette. There is fine interstitial pattern in the lungs. No focal consolidation. Pleural fluid or pneumothorax. Vast improvement in the airspace disease primarily LEFT lung seen on comparison exam. IMPRESSION: 1. Vast improvement in LEFT lung airspace disease. 2. Mild interstitial pattern may suggest interstitial edema. Electronically Signed   By: Suzy Bouchard M.D.   On: 04/19/2022 13:22   DG Chest Port 1 View  Result Date: 04/15/2022 CLINICAL DATA:  Respiratory disease EXAM: PORTABLE CHEST 1 VIEW COMPARISON:  Two days ago FINDINGS: Extensive airspace disease asymmetric to the left lung. Normal heart size and mediastinal contours. Right IJ line with tip at the SVC, no reported history of hemoptysis. No visible effusion or air leak IMPRESSION: Worsening airspace disease now asymmetric to the left, asymmetry favoring pneumonia. Electronically Signed   By: Jorje Guild M.D.   On: 04/15/2022 06:18   DG CHEST PORT 1 VIEW  Result Date: 04/13/2022 CLINICAL DATA:  Shortness of breath and cough. EXAM: PORTABLE CHEST 1 VIEW COMPARISON:  Radiograph 04/10/2022 FINDINGS: Right internal jugular central venous catheter tip overlies the mid SVC. New areas of patchy bilateral airspace opacity, left perihilar, right upper lobe abutting the fissure and both lung bases. Stable heart size, upper normal, likely  accentuated by technique. No pneumothorax or large pleural effusion. IMPRESSION: New patchy bilateral airspace opacity. This is new from exam 3 days ago. Differential considerations include pneumonia (including aspiration) or less likely pulmonary edema. Electronically Signed   By:  Narda Rutherford M.D.   On: 04/13/2022 21:50   US Abdomen Limited RUQ (LIVER/GB)  Result Date: 04/10/2022 CLINICAL DATA:  Elevated liver function tests EXAM: ULTRASOUND ABDOMEN LIMITED RIGHT UPPER QUADRANT COMPARISON:  CT earlier same day. FINDINGS: Gallbladder: Surgically absent. Common bile duct: Diameter: Common bile duct could not be identified. There is no intrahepatic ductal dilatation. Liver: Diffusely and markedly echogenic liver parenchyma consistent with the advanced fatty change shown by CT. No identifiable focal lesion. Portal vein is patent on color Doppler imaging with normal direction of blood flow towards the liver. Other: No ascites. IMPRESSION: Previous cholecystectomy. Diffusely echogenic liver consistent with diffuse fatty change. No evidence of intrahepatic ductal dilatation. The common duct could not be identified. Electronically Signed   By: Paulina Fusi M.D.   On: 04/10/2022 19:31   ECHOCARDIOGRAM COMPLETE  Result Date: 04/10/2022    ECHOCARDIOGRAM REPORT   Patient Name:   [name] Date of Exam: 04/10/2022 Medical Rec #:  [id number]        Height:       66.0 in Accession #:    [id number]       Weight:       218.7 lb Date of Birth:  01-31-1976       BSA:          2.077 m Patient Age:    45 years         BP:           90/41 mmHg Patient Gender: F                HR:           69 bpm. Exam Location:  Inpatient Procedure: 2D Echo, Cardiac Doppler and Color Doppler Indications:    Murmur R01.1  History:        Patient has no prior history of Echocardiogram examinations.  Sonographer:    Eulah Pont RDCS Referring Phys: Bevelyn Ngo IMPRESSIONS  1. Left ventricular ejection fraction, by estimation, is  >75%. The left ventricle has hyperdynamic function. The left ventricle has no regional wall motion abnormalities. Left ventricular diastolic parameters were normal.  2. Right ventricular systolic function is normal. The right ventricular size is normal. There is normal pulmonary artery systolic pressure.  3. The mitral valve is normal in structure. Trivial mitral valve regurgitation. No evidence of mitral stenosis.  4. The aortic valve was not well visualized. Aortic valve regurgitation is not visualized. No aortic stenosis is present. Comparison(s): No prior Echocardiogram. Conclusion(s)/Recommendation(s): Otherwise normal echocardiogram, with minor abnormalities described in the report. FINDINGS  Left Ventricle: Left ventricular ejection fraction, by estimation, is >75%. The left ventricle has hyperdynamic function. The left ventricle has no regional wall motion abnormalities. The left ventricular internal cavity size was normal in size. There is no left ventricular hypertrophy. Left ventricular diastolic parameters were normal. Right Ventricle: The right ventricular size is normal. Right vetricular wall thickness was not well visualized. Right ventricular systolic function is normal. There is normal pulmonary artery systolic pressure. The tricuspid regurgitant velocity is 2.62 m/s, and with an assumed right atrial pressure of 3 mmHg, the estimated right ventricular systolic pressure is 30.5 mmHg. Left [facility]: Left atrial size was normal in size. Right [facility]: Right atrial size was normal in size. Pericardium: There is no evidence of pericardial effusion. Mitral Valve: The mitral valve is normal in structure. Trivial mitral valve regurgitation. No evidence of mitral valve stenosis. Tricuspid Valve: The tricuspid valve is normal  in structure. Tricuspid valve regurgitation is trivial. No evidence of tricuspid stenosis. Aortic Valve: The aortic valve was not well visualized. Aortic valve regurgitation is not  visualized. No aortic stenosis is present. Pulmonic Valve: The pulmonic valve was not well visualized. Pulmonic valve regurgitation is not visualized. Aorta: The aortic root and ascending aorta are structurally normal, with no evidence of dilitation and the aortic arch was not well visualized. Venous: The inferior vena cava was not well visualized. IAS/Shunts: The atrial septum is grossly normal.  LEFT VENTRICLE PLAX 2D LVIDd:         4.90 cm      Diastology LVIDs:         2.50 cm      LV e' medial:    8.24 cm/s LV PW:         1.10 cm      LV E/e' medial:  16.9 LV IVS:        1.10 cm      LV e' lateral:   10.30 cm/s LVOT diam:     2.00 cm      LV E/e' lateral: 13.5 LV SV:         104 LV SV Index:   50 LVOT Area:     3.14 cm  LV Volumes (MOD) LV vol d, MOD A2C: 103.0 ml LV vol d, MOD A4C: 111.0 ml LV vol s, MOD A2C: 21.9 ml LV vol s, MOD A4C: 22.9 ml LV SV MOD A2C:     81.1 ml LV SV MOD A4C:     111.0 ml LV SV MOD BP:      88.5 ml RIGHT VENTRICLE RV S prime:     12.10 cm/s TAPSE (M-mode): 2.6 cm LEFT ATRIUM             Index        RIGHT ATRIUM           Index LA diam:        4.00 cm 1.93 cm/m   RA Area:     12.10 cm LA Vol (A2C):   52.7 ml 25.37 ml/m  RA Volume:   29.00 ml  13.96 ml/m LA Vol (A4C):   50.2 ml 24.17 ml/m LA Biplane Vol: 54.2 ml 26.09 ml/m  AORTIC VALVE LVOT Vmax:   144.00 cm/s LVOT Vmean:  101.000 cm/s LVOT VTI:    0.331 m  AORTA Ao Root diam: 3.00 cm Ao Asc diam:  3.10 cm MITRAL VALVE                TRICUSPID VALVE MV Area (PHT): 4.60 cm     TR Peak grad:   27.5 mmHg MV Decel Time: 165 msec     TR Vmax:        262.00 cm/s MV E velocity: 139.00 cm/s MV A velocity: 106.00 cm/s  SHUNTS MV E/A ratio:  1.31         Systemic VTI:  0.33 m                             Systemic Diam: 2.00 cm Buford Dresser MD Electronically signed by Buford Dresser MD Signature Date/Time: 04/10/2022/7:16:08 PM    Final    DG Chest Port 1 View  Result Date: 04/10/2022 CLINICAL DATA:  Encounter for  central line placement. EXAM: PORTABLE CHEST 1 VIEW COMPARISON:  11/22/2019 FINDINGS: Right jugular dialysis catheter has been placed. Catheter tip in the  SVC region. Prominent bandlike density in the mid left lung is most compatible with subsegmental atelectasis. Overall, low lung volumes. Negative for a pneumothorax. Heart size is within normal limits. IMPRESSION: 1. Right jugular central line tip in the SVC region. Negative for pneumothorax. 2. Left lung atelectasis. Electronically Signed   By: Markus Daft M.D.   On: 04/10/2022 15:51   CT ABDOMEN PELVIS WO CONTRAST  Result Date: 04/10/2022 CLINICAL DATA:  Nausea vomiting. EXAM: CT ABDOMEN AND PELVIS WITHOUT CONTRAST TECHNIQUE: Multidetector CT imaging of the abdomen and pelvis was performed following the standard protocol without IV contrast. RADIATION DOSE REDUCTION: This exam was performed according to the departmental dose-optimization program which includes automated exposure control, adjustment of the mA and/or kV according to patient size and/or use of iterative reconstruction technique. COMPARISON:  CT abdomen pelvis dated 10/19/2010. FINDINGS: Evaluation of this exam is limited in the absence of intravenous contrast. Lower chest: The visualized lung bases are clear. No intra-abdominal free air or free fluid. Hepatobiliary: Severe fatty liver. No intrahepatic dilatation. A 2 cm rounded structure along the medial left lobe of the liver (27/4) demonstrates similar attenuation as liver parenchyma, likely represent hepatic tissue. Cholecystectomy. Pancreas: Mild haziness adjacent to the uncinate process of the pancreas, possibly related to volume averaging. Correlation with pancreatic enzymes recommended to exclude pancreatitis. No dilatation of the main pancreatic duct or gland atrophy. Spleen: Normal in size without focal abnormality. Adrenals/Urinary Tract: The adrenal glands are unremarkable. The kidneys, visualized ureters, and the urinary bladder  appear unremarkable. Stomach/Bowel: Postsurgical changes of gastric bypass. There is no bowel obstruction or active inflammation. The appendix is normal. Vascular/Lymphatic: Mild aortoiliac atherosclerotic disease. The IVC is unremarkable. No portal venous gas. There is no adenopathy. Reproductive: The uterus is anteverted. No adnexal masses. Surgical clips in the region of the left adnexa. Other: None Musculoskeletal: No acute or significant osseous findings. IMPRESSION: 1. Severe fatty liver. 2. Artifact versus less likely mild acute pancreatitis. Correlation with pancreatic enzymes recommended. 3. No bowel obstruction. Normal appendix. 4. Aortic Atherosclerosis (ICD10-I70.0). Electronically Signed   By: Anner Crete M.D.   On: 04/10/2022 02:42    Microbiology: Results for orders placed or performed during the hospital encounter of 04/09/22  Gastrointestinal Panel by PCR , Stool     Status: Abnormal   Collection Time: 04/10/22  8:50 AM   Specimen: Stool  Result Value Ref Range Status   Campylobacter species NOT DETECTED NOT DETECTED Final   Plesimonas shigelloides NOT DETECTED NOT DETECTED Final   Salmonella species NOT DETECTED NOT DETECTED Final   Yersinia enterocolitica NOT DETECTED NOT DETECTED Final   Vibrio species NOT DETECTED NOT DETECTED Final   Vibrio cholerae NOT DETECTED NOT DETECTED Final   Enteroaggregative E coli (EAEC) NOT DETECTED NOT DETECTED Final   Enteropathogenic E coli (EPEC) NOT DETECTED NOT DETECTED Final   Enterotoxigenic E coli (ETEC) NOT DETECTED NOT DETECTED Final   Shiga like toxin producing E coli (STEC) NOT DETECTED NOT DETECTED Final   Shigella/Enteroinvasive E coli (EIEC) NOT DETECTED NOT DETECTED Final   Cryptosporidium NOT DETECTED NOT DETECTED Final   Cyclospora cayetanensis NOT DETECTED NOT DETECTED Final   Entamoeba histolytica NOT DETECTED NOT DETECTED Final   Giardia lamblia NOT DETECTED NOT DETECTED Final   Adenovirus F40/41 NOT DETECTED NOT  DETECTED Final   Astrovirus NOT DETECTED NOT DETECTED Final   Norovirus GI/GII DETECTED (A) NOT DETECTED Final    Comment: RESULT CALLED TO, READ BACK BY AND VERIFIED WITH:  TONYA SHELTON 04/10/22 1418 KLW    Rotavirus A NOT DETECTED NOT DETECTED Final   Sapovirus (I, II, IV, and V) NOT DETECTED NOT DETECTED Final    Comment: Performed at [facility], [address] [place], [place] [address]  Culture, blood (Routine X 2) w Reflex to ID Panel     Status: None   Collection Time: 04/10/22  4:04 PM   Specimen: BLOOD RIGHT HAND  Result Value Ref Range Status   Specimen Description BLOOD RIGHT HAND  Final   Special Requests   Final    BOTTLES DRAWN AEROBIC AND ANAEROBIC Blood Culture adequate volume   Culture   Final    NO GROWTH 5 DAYS Performed at [facility], [place] [address], [place], [place] [address]    Report Status 04/15/2022 FINAL  Final  Gastrointestinal Panel by PCR , Stool     Status: None   Collection Time: 04/11/22  8:50 AM  Result Value Ref Range Status   Campylobacter species NOT DETECTED NOT DETECTED Final   Plesimonas shigelloides NOT DETECTED NOT DETECTED Final   Salmonella species NOT DETECTED NOT DETECTED Final   Yersinia enterocolitica NOT DETECTED NOT DETECTED Final   Vibrio species NOT DETECTED NOT DETECTED Final   Vibrio cholerae NOT DETECTED NOT DETECTED Final   Enteroaggregative E coli (EAEC) NOT DETECTED NOT DETECTED Final   Enteropathogenic E coli (EPEC) NOT DETECTED NOT DETECTED Final   Enterotoxigenic E coli (ETEC) NOT DETECTED NOT DETECTED Final   Shiga like toxin producing E coli (STEC) NOT DETECTED NOT DETECTED Final   Shigella/Enteroinvasive E coli (EIEC) NOT DETECTED NOT DETECTED Final   Cryptosporidium NOT DETECTED NOT DETECTED Final   Cyclospora cayetanensis NOT DETECTED NOT DETECTED Final   Entamoeba histolytica NOT DETECTED NOT DETECTED Final   Giardia lamblia NOT DETECTED NOT DETECTED Final   Adenovirus F40/41 NOT DETECTED NOT  DETECTED Final   Astrovirus NOT DETECTED NOT DETECTED Final   Norovirus GI/GII NOT DETECTED NOT DETECTED Final   Rotavirus A NOT DETECTED NOT DETECTED Final   Sapovirus (I, II, IV, and V) NOT DETECTED NOT DETECTED Final    Comment: Performed at [facility], [address] [place], [place] [address]  C Difficile Quick Screen (NO PCR Reflex)     Status: None   Collection Time: 04/17/22 12:48 PM   Specimen: STOOL  Result Value Ref Range Status   C Diff antigen NEGATIVE NEGATIVE Final   C Diff toxin NEGATIVE NEGATIVE Final   C Diff interpretation No C. difficile detected.  Final    Comment: Performed at [facility], [place] [address], [place],  [address]    Labs: CBC: Recent Labs  Lab 04/17/22 [phone] 04/18/22 0303 04/19/22 0323 04/20/22 0318 04/21/22 0418 04/22/22 0112  WBC 12.5* 14.4* 13.8* 15.1* 16.1* 20.8*  NEUTROABS 10.2* 11.9* 11.2* 11.6* 13.0*  --   HGB 8.0* 8.4* 7.4* 7.2* 7.0* 8.6*  HCT 23.0* 24.5* 21.5* 21.7* 20.4* 25.7*  MCV 113.3* 114.5* 113.8* 112.4* 111.5* 104.9*  PLT 50* 60* 48* 53* 47* 61*   Basic Metabolic Panel: Recent Labs  Lab 04/16/22 0102 04/17/22 0822 04/18/22 0303 04/19/22 0323 04/20/22 0318 04/21/22 0418 04/22/22 0112  NA 138   < > 138 139 138 137 138  K 3.5   < > 4.1 4.1 3.9 3.6 3.6  CL 106   < > 106 109 104 106 106  CO2 21*   < > 23 23 24 24  24  GLUCOSE 142*   < > 133* 125* 125* 131* 128*  BUN 40*   < > 45* 40* 38* 34* 29*  CREATININE 2.10*   < > 1.82* 1.69* 1.54* 1.61* 1.58*  CALCIUM 7.4*   < > 8.4* 8.6* 8.9 8.6* 8.9  MG 2.0  --   --   --  2.0  --   --    < > = values in this interval not displayed.   Liver Function Tests: Recent Labs  Lab 04/18/22 0303 04/19/22 0323 04/20/22 0318 04/21/22 0418 04/22/22 0112  AST 150* 134* 127* 121* 147*  ALT 104* 102* 112* 116* 142*  ALKPHOS 109 101 100 94 96  BILITOT 8.9* 7.9* 7.6* 6.9* 9.7*  PROT 7.2 6.5 6.5 6.4* 7.3  ALBUMIN 2.5* 2.2* 2.2* 2.1* 2.5*   CBG: Recent Labs   Lab 04/21/22 1202 04/21/22 1648 04/21/22 2042 04/22/22 0807 04/22/22 1229  GLUCAP 130* 169* 156* 116* 172*    Discharge time spent: approximately 35 minutes spent on discharge counseling, evaluation of patient on day of discharge, and coordination of discharge planning with nursing, social work, pharmacy and case management  Signed: Alberteen Sam, MD [facility] 04/22/2022

## 2022-04-22 NOTE — Progress Notes (Signed)
Minta Balsam to be D/C'd  per MD order.  Discussed with the patient and all questions fully answered.  VSS, HH care ordered and patient made aware. Patient educated about her wound dressing to her sacral area. Patient verbalizes understanding.  IV catheter discontinued intact. Site without signs and symptoms of complications. Dressing and pressure applied.  An After Visit Summary was printed and given to the patient. Patient received prescription.  D/c education completed with patient/family including follow up instructions, medication list, d/c activities limitations if indicated, with other d/c instructions as indicated by MD - patient able to verbalize understanding, all questions fully answered.   Patient instructed to return to ED, call 911, or call MD for any changes in condition.   Patient to be escorted via WC, and D/C home via private auto.

## 2022-04-22 NOTE — Plan of Care (Signed)

## 2022-04-24 ENCOUNTER — Inpatient Hospital Stay (HOSPITAL_COMMUNITY)
Admission: EM | Admit: 2022-04-24 | Discharge: 2022-04-29 | DRG: 640 | Disposition: A | Discharge status: Home Health | Payer: Self-pay | Attending: Internal Medicine | Admitting: Internal Medicine

## 2022-04-24 DIAGNOSIS — L89154 Pressure ulcer of sacral region, stage 4: Secondary | ICD-10-CM | POA: Diagnosis present

## 2022-04-24 DIAGNOSIS — R609 Edema, unspecified: Principal | ICD-10-CM

## 2022-04-24 DIAGNOSIS — N1832 Chronic kidney disease, stage 3b: Secondary | ICD-10-CM | POA: Diagnosis present

## 2022-04-24 DIAGNOSIS — K7010 Alcoholic hepatitis without ascites: Secondary | ICD-10-CM | POA: Diagnosis present

## 2022-04-24 DIAGNOSIS — D689 Coagulation defect, unspecified: Secondary | ICD-10-CM | POA: Diagnosis present

## 2022-04-24 DIAGNOSIS — I700 Atherosclerosis of aorta: Secondary | ICD-10-CM | POA: Diagnosis present

## 2022-04-24 DIAGNOSIS — F17210 Nicotine dependence, cigarettes, uncomplicated: Secondary | ICD-10-CM | POA: Diagnosis present

## 2022-04-24 DIAGNOSIS — K289 Gastrojejunal ulcer, unspecified as acute or chronic, without hemorrhage or perforation: Secondary | ICD-10-CM | POA: Diagnosis present

## 2022-04-24 DIAGNOSIS — D500 Iron deficiency anemia secondary to blood loss (chronic): Secondary | ICD-10-CM | POA: Diagnosis present

## 2022-04-24 DIAGNOSIS — K649 Unspecified hemorrhoids: Secondary | ICD-10-CM | POA: Diagnosis present

## 2022-04-24 DIAGNOSIS — Z9049 Acquired absence of other specified parts of digestive tract: Secondary | ICD-10-CM

## 2022-04-24 DIAGNOSIS — D509 Iron deficiency anemia, unspecified: Secondary | ICD-10-CM | POA: Diagnosis present

## 2022-04-24 DIAGNOSIS — Z9884 Bariatric surgery status: Secondary | ICD-10-CM

## 2022-04-24 DIAGNOSIS — Z800 Family history of malignant neoplasm of digestive organs: Secondary | ICD-10-CM

## 2022-04-24 DIAGNOSIS — K219 Gastro-esophageal reflux disease without esophagitis: Secondary | ICD-10-CM | POA: Diagnosis present

## 2022-04-24 DIAGNOSIS — Z803 Family history of malignant neoplasm of breast: Secondary | ICD-10-CM

## 2022-04-24 DIAGNOSIS — R600 Localized edema: Secondary | ICD-10-CM | POA: Diagnosis present

## 2022-04-24 DIAGNOSIS — N179 Acute kidney failure, unspecified: Secondary | ICD-10-CM | POA: Diagnosis present

## 2022-04-24 DIAGNOSIS — K284 Chronic or unspecified gastrojejunal ulcer with hemorrhage: Secondary | ICD-10-CM | POA: Diagnosis present

## 2022-04-24 DIAGNOSIS — K760 Fatty (change of) liver, not elsewhere classified: Secondary | ICD-10-CM | POA: Diagnosis present

## 2022-04-24 DIAGNOSIS — D649 Anemia, unspecified: Secondary | ICD-10-CM | POA: Diagnosis present

## 2022-04-24 DIAGNOSIS — Z8051 Family history of malignant neoplasm of kidney: Secondary | ICD-10-CM

## 2022-04-24 DIAGNOSIS — R601 Generalized edema: Secondary | ICD-10-CM | POA: Diagnosis present

## 2022-04-24 DIAGNOSIS — B3781 Candidal esophagitis: Secondary | ICD-10-CM | POA: Diagnosis present

## 2022-04-24 DIAGNOSIS — K767 Hepatorenal syndrome: Secondary | ICD-10-CM | POA: Diagnosis present

## 2022-04-24 DIAGNOSIS — E8779 Other fluid overload: Principal | ICD-10-CM | POA: Diagnosis present

## 2022-04-24 DIAGNOSIS — R748 Abnormal levels of other serum enzymes: Secondary | ICD-10-CM | POA: Diagnosis present

## 2022-04-24 DIAGNOSIS — F419 Anxiety disorder, unspecified: Secondary | ICD-10-CM | POA: Diagnosis present

## 2022-04-24 DIAGNOSIS — K7030 Alcoholic cirrhosis of liver without ascites: Secondary | ICD-10-CM | POA: Diagnosis present

## 2022-04-24 DIAGNOSIS — E669 Obesity, unspecified: Secondary | ICD-10-CM | POA: Diagnosis present

## 2022-04-24 DIAGNOSIS — K766 Portal hypertension: Secondary | ICD-10-CM | POA: Diagnosis present

## 2022-04-24 DIAGNOSIS — Z801 Family history of malignant neoplasm of trachea, bronchus and lung: Secondary | ICD-10-CM

## 2022-04-24 DIAGNOSIS — D696 Thrombocytopenia, unspecified: Secondary | ICD-10-CM | POA: Diagnosis present

## 2022-04-24 DIAGNOSIS — Z79899 Other long term (current) drug therapy: Secondary | ICD-10-CM

## 2022-04-24 DIAGNOSIS — D631 Anemia in chronic kidney disease: Secondary | ICD-10-CM | POA: Diagnosis present

## 2022-04-24 DIAGNOSIS — I129 Hypertensive chronic kidney disease with stage 1 through stage 4 chronic kidney disease, or unspecified chronic kidney disease: Secondary | ICD-10-CM | POA: Diagnosis present

## 2022-04-24 DIAGNOSIS — E876 Hypokalemia: Secondary | ICD-10-CM | POA: Diagnosis present

## 2022-04-24 DIAGNOSIS — I959 Hypotension, unspecified: Secondary | ICD-10-CM | POA: Diagnosis present

## 2022-04-24 DIAGNOSIS — K909 Intestinal malabsorption, unspecified: Secondary | ICD-10-CM | POA: Diagnosis present

## 2022-04-24 LAB — BASIC METABOLIC PANEL
Anion gap: 12 (ref 5–15)
BUN: 24 mg/dL — ABNORMAL HIGH (ref 6–20)
CO2: 20 mmol/L — ABNORMAL LOW (ref 22–32)
Calcium: 8.8 mg/dL — ABNORMAL LOW (ref 8.9–10.3)
Chloride: 105 mmol/L (ref 98–111)
Creatinine, Ser: 1.5 mg/dL — ABNORMAL HIGH (ref 0.44–1.00)
GFR, Estimated: 44 mL/min — ABNORMAL LOW (ref >60)
Glucose, Bld: 141 mg/dL — ABNORMAL HIGH (ref 70–99)
Potassium: 4.1 mmol/L (ref 3.5–5.1)
Sodium: 137 mmol/L (ref 135–145)

## 2022-04-24 NOTE — ED Provider Triage Note (Signed)
Emergency Medicine Provider Triage Evaluation Note  [name] , a 46 y.o. female  was evaluated in triage.  Pt complains of worsening bilateral peripheral edema since recent discharge on 7/29.  Recently admitted for AKI.  Review of Systems  Positive: As above Negative: As above  Physical Exam  LMP 03/10/2022 (Approximate)  Gen:   Awake, no distress   Resp:  Normal effort  MSK:   Moves extremities without difficulty Other:  Bilateral pitting edema present.  Medical Decision Making  Medically screening exam initiated at 5:50 PM.  Appropriate orders placed.  ANDERSYN FRAGOSO was informed that the remainder of the evaluation will be completed by another provider, this initial triage assessment does not replace that evaluation, and the importance of remaining in the ED until their evaluation is complete.     Marita Kansas, PA-C 04/24/22 1751

## 2022-04-24 NOTE — ED Triage Notes (Signed)
Pt c/o BLE edema x2 days, discharged Saturday w same, advised to come back if it got worse. Reports going home "w stockings, but can't wear them." Pt w 9/10 tightness. Pt also endorses R abd/flank pain. Compliant w meds at home.

## 2022-04-25 ENCOUNTER — Other Ambulatory Visit: Payer: Self-pay

## 2022-04-25 DIAGNOSIS — N179 Acute kidney failure, unspecified: Secondary | ICD-10-CM

## 2022-04-25 DIAGNOSIS — K767 Hepatorenal syndrome: Secondary | ICD-10-CM

## 2022-04-25 DIAGNOSIS — D649 Anemia, unspecified: Secondary | ICD-10-CM

## 2022-04-25 DIAGNOSIS — R601 Generalized edema: Secondary | ICD-10-CM

## 2022-04-25 DIAGNOSIS — D500 Iron deficiency anemia secondary to blood loss (chronic): Secondary | ICD-10-CM

## 2022-04-25 LAB — CREATININE, SERUM
Creatinine, Ser: 1.44 mg/dL — ABNORMAL HIGH (ref 0.44–1.00)
GFR, Estimated: 46 mL/min — ABNORMAL LOW (ref >60)

## 2022-04-25 LAB — CBC WITH DIFFERENTIAL/PLATELET
Abs Immature Granulocytes: 0.16 10*3/uL — ABNORMAL HIGH (ref 0.00–0.07)
Basophils Absolute: 0 10*3/uL (ref 0.0–0.1)
Basophils Relative: 0 %
Eosinophils Absolute: 0 10*3/uL (ref 0.0–0.5)
Eosinophils Relative: 0 %
HCT: 21.9 % — ABNORMAL LOW (ref 36.0–46.0)
Hemoglobin: 7.3 g/dL — ABNORMAL LOW (ref 12.0–15.0)
Immature Granulocytes: 1 %
Lymphocytes Relative: 9 %
Lymphs Abs: 1.6 10*3/uL (ref 0.7–4.0)
MCH: 35.8 pg — ABNORMAL HIGH (ref 26.0–34.0)
MCHC: 33.3 g/dL (ref 30.0–36.0)
MCV: 107.4 fL — ABNORMAL HIGH (ref 80.0–100.0)
Monocytes Absolute: 1.1 10*3/uL — ABNORMAL HIGH (ref 0.1–1.0)
Monocytes Relative: 6 %
Neutro Abs: 15.7 10*3/uL — ABNORMAL HIGH (ref 1.7–7.7)
Neutrophils Relative %: 84 %
Platelets: 86 10*3/uL — ABNORMAL LOW (ref 150–400)
RBC: 2.04 MIL/uL — ABNORMAL LOW (ref 3.87–5.11)
WBC: 18.6 10*3/uL — ABNORMAL HIGH (ref 4.0–10.5)
nRBC: 0.4 % — ABNORMAL HIGH (ref 0.0–0.2)

## 2022-04-25 LAB — HEPATIC FUNCTION PANEL
ALT: 154 U/L — ABNORMAL HIGH (ref 0–44)
AST: 122 U/L — ABNORMAL HIGH (ref 15–41)
Albumin: 2.4 g/dL — ABNORMAL LOW (ref 3.5–5.0)
Alkaline Phosphatase: 120 U/L (ref 38–126)
Bilirubin, Direct: 2.8 mg/dL — ABNORMAL HIGH (ref 0.0–0.2)
Indirect Bilirubin: 3.6 mg/dL — ABNORMAL HIGH (ref 0.3–0.9)
Total Bilirubin: 6.4 mg/dL — ABNORMAL HIGH (ref 0.3–1.2)
Total Protein: 6.3 g/dL — ABNORMAL LOW (ref 6.5–8.1)

## 2022-04-25 LAB — PROTIME-INR
INR: 1.5 — ABNORMAL HIGH (ref 0.8–1.2)
Prothrombin Time: 17.9 seconds — ABNORMAL HIGH (ref 11.4–15.2)

## 2022-04-25 LAB — PREPARE RBC (CROSSMATCH)

## 2022-04-25 MED ORDER — PANTOPRAZOLE SODIUM 40 MG PO TBEC
40.0000 mg | DELAYED_RELEASE_TABLET | Freq: Two times a day (BID) | ORAL | Status: DC
Start: 2022-04-25 — End: 2022-04-29
  Administered 2022-04-25 – 2022-04-29 (×8): 40 mg via ORAL
  Filled 2022-04-25 (×8): qty 1

## 2022-04-25 MED ORDER — FERROUS SULFATE 325 (65 FE) MG PO TABS
325.0000 mg | ORAL_TABLET | Freq: Every day | ORAL | Status: DC
  Administered 2022-04-26 – 2022-04-29 (×4): 325 mg via ORAL
  Filled 2022-04-25 (×4): qty 1

## 2022-04-25 MED ORDER — FLUCONAZOLE 100 MG PO TABS
200.0000 mg | ORAL_TABLET | Freq: Every day | ORAL | Status: DC
  Administered 2022-04-25 – 2022-04-29 (×5): 200 mg via ORAL
  Filled 2022-04-25 (×4): qty 2
  Filled 2022-04-25: qty 1

## 2022-04-25 MED ORDER — FOLIC ACID 1 MG PO TABS
1.0000 mg | ORAL_TABLET | Freq: Every day | ORAL | Status: DC
  Administered 2022-04-25 – 2022-04-29 (×5): 1 mg via ORAL
  Filled 2022-04-25 (×5): qty 1

## 2022-04-25 MED ORDER — SODIUM CHLORIDE 0.9% IV SOLUTION: Freq: Once | INTRAVENOUS | Status: AC

## 2022-04-25 MED ORDER — MEDIHONEY WOUND/BURN DRESSING EX PSTE
1.0000 | PASTE | Freq: Every day | CUTANEOUS | Status: DC
Start: 2022-04-25 — End: 2022-04-25
  Filled 2022-04-25 (×3): qty 44

## 2022-04-25 MED ORDER — SODIUM CHLORIDE 0.9% FLUSH
3.0000 mL | Freq: Two times a day (BID) | INTRAVENOUS | Status: DC
  Administered 2022-04-26 – 2022-04-28 (×6): 3 mL via INTRAVENOUS

## 2022-04-25 MED ORDER — LOPERAMIDE HCL 2 MG PO CAPS
2.0000 mg | ORAL_CAPSULE | ORAL | Status: DC | PRN
  Administered 2022-04-26: 2 mg via ORAL
  Filled 2022-04-25: qty 1

## 2022-04-25 MED ORDER — HYDROMORPHONE HCL 1 MG/ML IJ SOLN
0.5000 mg | INTRAMUSCULAR | Status: DC | PRN
  Administered 2022-04-25 (×2): 1 mg via INTRAVENOUS
  Filled 2022-04-25 (×2): qty 1

## 2022-04-25 MED ORDER — TRAMADOL HCL 50 MG PO TABS
50.0000 mg | ORAL_TABLET | Freq: Three times a day (TID) | ORAL | Status: DC | PRN
  Administered 2022-04-26 – 2022-04-28 (×7): 50 mg via ORAL
  Filled 2022-04-25 (×7): qty 1

## 2022-04-25 MED ORDER — SODIUM CHLORIDE 0.9 % IV SOLN: 250.0000 mL | INTRAVENOUS | Status: DC | PRN

## 2022-04-25 MED ORDER — SODIUM CHLORIDE 0.9% FLUSH
3.0000 mL | INTRAVENOUS | Status: DC | PRN
  Administered 2022-04-27: 3 mL via INTRAVENOUS

## 2022-04-25 MED ORDER — SUCRALFATE 1 GM/10ML PO SUSP
1.0000 g | Freq: Four times a day (QID) | ORAL | Status: DC
  Administered 2022-04-25 – 2022-04-29 (×15): 1 g via ORAL
  Filled 2022-04-25 (×15): qty 10

## 2022-04-25 MED ORDER — ONDANSETRON HCL 4 MG/2ML IJ SOLN
4.0000 mg | Freq: Four times a day (QID) | INTRAMUSCULAR | Status: DC | PRN
  Administered 2022-04-25 – 2022-04-28 (×4): 4 mg via INTRAVENOUS
  Filled 2022-04-25 (×4): qty 2

## 2022-04-25 MED ORDER — FUROSEMIDE 10 MG/ML IJ SOLN
40.0000 mg | Freq: Two times a day (BID) | INTRAMUSCULAR | Status: DC
  Administered 2022-04-25 – 2022-04-26 (×2): 40 mg via INTRAVENOUS
  Filled 2022-04-25 (×2): qty 4

## 2022-04-25 MED ORDER — ONDANSETRON HCL 4 MG PO TABS
4.0000 mg | ORAL_TABLET | Freq: Four times a day (QID) | ORAL | Status: DC | PRN
  Administered 2022-04-28 (×2): 4 mg via ORAL
  Filled 2022-04-25 (×2): qty 1

## 2022-04-25 MED ORDER — ALBUMIN HUMAN 25 % IV SOLN
25.0000 g | Freq: Three times a day (TID) | INTRAVENOUS | Status: AC
  Administered 2022-04-25 – 2022-04-26 (×3): 25 g via INTRAVENOUS
  Filled 2022-04-25 (×3): qty 100

## 2022-04-25 MED ORDER — PREDNISOLONE 5 MG PO TABS
40.0000 mg | ORAL_TABLET | Freq: Every day | ORAL | Status: DC
  Administered 2022-04-25 – 2022-04-29 (×5): 40 mg via ORAL
  Filled 2022-04-25 (×5): qty 8

## 2022-04-25 NOTE — Consult Note (Signed)
WOC Nurse Consult Note: Reason for Consult: sacral pressure injury Wound type:UNCLEAR etiology; wound is not over the sacrum, it is in the gluteal cleft/fold. Wound progressed quickly from moisture associated skin damage to a LARGE open cavity with undermining.  CT scan did not reveal any other suggestions for this wound Pressure Injury POA: NA Measurement:8cm x 10cm x 4cm, unable to obtain accurate measurements. Limited by ED stretcher Wound PXT:GGYIR, pink. pale Drainage (amount, consistency, odor) heavy, yellow/green, no odor Periwound: intact  Dressing procedure/placement/frequency: Based on more accurate assessment now that patient is in a room in the ED, I will DC medihoney order.   Flush wound well with saline (using large toomy syringe) Pack wound bed, including undermining with silver hydrofiber to wound base Fluff fill remainder of the wound bed with kerlix  Top with ABD pad, secure with tape. Change  daily.   Low air loss mattress for moisture management and pressure redistribution RD for consult for nutrition related to wound healing.   Discussed POC with patient and bedside nurse.  Re consult if needed, will not follow at this time. Thanks  Dola Lunsford [employer], RN,CWOCN, CNS, CWON-AP [phone])

## 2022-04-25 NOTE — Progress Notes (Signed)
Orthopedic Tech Progress Note Patient Details:  [name] July 21, 1976 [id number]  @0744  I was called down to a trauma, when the RN asked if I could apply ace wrap to patient  but patient had stated " MD told me I didn't need my legs wrapped" I told patient I'll let the RN know what you're saying, told RN and she said " ok we'll figure it out"  Patient ID: [name], female   DOB: May 31, 1976, 46 y.o.   MRN: [id number]  [id number] 04/25/2022, 5:13 PM

## 2022-04-25 NOTE — Consult Note (Signed)
WOC consulted for wound, however noted patient in hall bed, requested image of patient's wound to be placed in chart.  Patient just seen 04/18/22 for same wound;  Unclear etiology; ? Pressure and moisture per surgery team. No acute process that required surgical intervention. Based on images less than one week ago I will add topical wound care for now. And plan to see patient once she has been admitted and I can assess in a more appropriate setting.   Vraj Denardo MSN,RN,CWOCN, CNS, CWON-AP [phone]  

## 2022-04-25 NOTE — ED Provider Notes (Addendum)
[facility] EMERGENCY DEPARTMENT Provider Note   CSN: [id number] Arrival date & time: 04/24/22  1724     History  Chief Complaint  Patient presents with   Leg Swelling    [name] is a 46 y.o. female.  The history is provided by the patient and medical records.  [name] is a 46 y.o. female who presents to the Emergency Department complaining of edema.  She presents to the emergency department for evaluation of progressive lower extremity edema since her recent hospital discharge on Saturday.  She states after leaving the hospital she has been having some difficulty with ambulation and feeling unsteady and like her legs are heavy.  She also reports increased swelling to her legs as well as weeping through her skin.  She feels dizzy on standing.  She has been experiencing ongoing black stools, but she was experiencing these during her hospitalization as well.  She has a sacral wound that is managed by home health.  She was discharged with a recommendation to use compression stockings but has not been able to wear these because of too much discomfort.  No associated fever, chest pain, shortness of breath.  She has been compliant with all of her medications since discharge.  No alcohol use.  Patient with recent hospitalization for hypovolemic shock, hepatorenal syndrome and AKI, requiring multiple blood transfusions for progressive insidious GI blood loss.  Home Medications Prior to Admission medications   Medication Sig Start Date End Date Taking? Authorizing Provider  busPIRone (BUSPAR) 15 MG tablet Take 15 mg by mouth 3 (three) times daily as needed (anxiety). 06/09/21   [provider]  Cyanocobalamin (B-12 PO) Take 1 tablet by mouth daily.    [provider]  ferrous sulfate 325 (65 FE) MG tablet Take 1 tablet (325 mg total) by mouth daily with breakfast. 04/23/22   Danford, Earl Lites, MD  fluconazole (DIFLUCAN) 200 MG tablet Take 1  tablet (200 mg total) by mouth daily. 04/22/22   Danford, Earl Lites, MD  folic acid (FOLVITE) 1 MG tablet Take 1 tablet (1 mg total) by mouth daily. 04/22/22   Danford, Earl Lites, MD  loperamide (IMODIUM) 2 MG capsule Take 1 capsule (2 mg total) by mouth as needed for diarrhea or loose stools. 04/22/22   Danford, Earl Lites, MD  pantoprazole (PROTONIX) 40 MG tablet Take 1 tablet (40 mg total) by mouth 2 (two) times daily before a meal. 04/22/22   Danford, Earl Lites, MD  prednisoLONE 5 MG TABS tablet Take 8 tablets (40 mg total) by mouth daily for 19 days. 04/23/22 05/12/22  Danford, Earl Lites, MD  prednisoLONE 5 MG TABS tablet Take 8 tablets (40 mg total) by mouth daily for 19 days. 04/22/22 05/11/22  Danford, Earl Lites, MD  sertraline (ZOLOFT) 100 MG tablet Take 100 mg by mouth as needed. 06/15/21   [provider]  sucralfate (CARAFATE) 1 GM/10ML suspension Take 10 mLs (1 g total) by mouth every 6 (six) hours. 04/22/22   Danford, Earl Lites, MD      Allergies    Tape    Review of Systems   Review of Systems  All other systems reviewed and are negative.   Physical Exam Updated Vital Signs BP (!) 107/49 (BP Location: Left Arm)   Pulse (!) 57   Temp 97.8 F (36.6 C) (Oral)   Resp 18   LMP 03/10/2022 (Approximate)   SpO2 100%  Physical Exam Vitals and nursing note reviewed.  Constitutional:      Appearance: She is well-developed.  HENT:     Head: Normocephalic and atraumatic.  Cardiovascular:     Rate and Rhythm: Normal rate and regular rhythm.  Pulmonary:     Effort: Pulmonary effort is normal. No respiratory distress.  Abdominal:     Palpations: Abdomen is soft.     Tenderness: There is no abdominal tenderness. There is no guarding or rebound.  Musculoskeletal:        General: No tenderness.     Comments: 3-4+ pitting edema to bilateral lower extremities  Skin:    General: Skin is warm and dry.     Coloration: Skin is jaundiced and pale.   Neurological:     Mental Status: She is alert and oriented to person, place, and time.     Comments: 4+ out of 5 strength in bilateral lower extremities  Psychiatric:        Behavior: Behavior normal.     ED Results / Procedures / Treatments   Labs (all labs ordered are listed, but only abnormal results are displayed) Labs Reviewed  BASIC METABOLIC PANEL - Abnormal; Notable for the following components:      Result Value   CO2 20 (*)    Glucose, Bld 141 (*)    BUN 24 (*)    Creatinine, Ser 1.50 (*)    Calcium 8.8 (*)    GFR, Estimated 44 (*)    All other components within normal limits  HEPATIC FUNCTION PANEL - Abnormal; Notable for the following components:   Total Protein 6.3 (*)    Albumin 2.4 (*)    AST 122 (*)    ALT 154 (*)    Total Bilirubin 6.4 (*)    Bilirubin, Direct 2.8 (*)    Indirect Bilirubin 3.6 (*)    All other components within normal limits  PROTIME-INR - Abnormal; Notable for the following components:   Prothrombin Time 17.9 (*)    INR 1.5 (*)    All other components within normal limits  CBC WITH DIFFERENTIAL/PLATELET - Abnormal; Notable for the following components:   WBC 18.6 (*)    RBC 2.04 (*)    Hemoglobin 7.3 (*)    HCT 21.9 (*)    MCV 107.4 (*)    MCH 35.8 (*)    Platelets 86 (*)    nRBC 0.4 (*)    Neutro Abs 15.7 (*)    Monocytes Absolute 1.1 (*)    Abs Immature Granulocytes 0.16 (*)    All other components within normal limits  TYPE AND SCREEN  PREPARE RBC (CROSSMATCH)    EKG None  Radiology No results found.  Procedures Procedures    Medications Ordered in ED Medications  0.9 %  sodium chloride infusion (Manually program via Guardrails IV Fluids) (has no administration in time range)    ED Course/ Medical Decision Making/ A&P                           Medical Decision Making Amount and/or Complexity of Data Reviewed Labs: ordered.  Risk Prescription drug management.   Patient with history of liver disease  here for evaluation of progressive leg swelling, dizziness, recently admitted for hepatorenal syndrome and hypovolemic shock.  She has pitting edema to bilateral lower extremities, icterus.  Labs are stable compared to priors aside from progressive anemia with hemoglobin down to 7.3.  Patient does have ongoing progressive GI bleeding, scopes negative for  intervenable lesion.  Given her symptoms we will transfuse.  Diuretics held at this time given her marginal blood pressure.  Medicine consulted for ongoing observation.  We will place Ace wrap for bilateral lower extremity edema.       Final Clinical Impression(s) / ED Diagnoses Final diagnoses:  Peripheral edema  Anemia, unspecified type    Rx / DC Orders ED Discharge Orders     None         Tilden Fossa, MD 04/25/22 7902    Tilden Fossa, MD 04/25/22 0730

## 2022-04-25 NOTE — Discharge Instructions (Addendum)
Continue wound care as instructed either at home or at PCP office.    Follow with Primary MD [name], NP in 3 days   Get CBC, CMP, 2 view Chest X ray -  checked next visit within 3 days by Primary MD    Activity: As tolerated with Full fall precautions use walker/cane & assistance as needed  Disposition Home    Diet: Heart Healthy with strict 1.5 L fluid restriction per day  Check your Weight same time everyday, if you gain over 2 pounds, or you develop in leg swelling, experience more shortness of breath or chest pain, call your Primary MD immediately. Follow Cardiac Low Salt Diet and 1.5 lit/day fluid restriction.  Special Instructions: If you have smoked or chewed Tobacco  in the last 2 yrs please stop smoking, stop any regular Alcohol  and or any Recreational drug use.  On your next visit with your primary care physician please Get Medicines reviewed and adjusted.  Please request your Prim.MD to go over all Hospital Tests and Procedure/Radiological results at the follow up, please get all Hospital records sent to your Prim MD by signing hospital release before you go home.  If you experience worsening of your admission symptoms, develop shortness of breath, life threatening emergency, suicidal or homicidal thoughts you must seek medical attention immediately by calling 911 or calling your MD immediately  if symptoms less severe.  You Must read complete instructions/literature along with all the possible adverse reactions/side effects for all the Medicines you take and that have been prescribed to you. Take any new Medicines after you have completely understood and accpet all the possible adverse reactions/side effects.

## 2022-04-25 NOTE — Consult Note (Deleted)
WOC consulted for wound, however noted patient in hall bed, requested image of patient's wound to be placed in chart.  Patient just seen 04/18/22 for same wound;  Unclear etiology; ? Pressure and moisture per surgery team. No acute process that required surgical intervention. Based on images less than one week ago I will add topical wound care for now. And plan to see patient once she has been admitted and I can assess in a more appropriate setting.   Mikolaj Woolstenhulme [facility], CNS, [employer] [phone]

## 2022-04-25 NOTE — H&P (Signed)
History and Physical        Hospital Admission Note Date: 04/25/2022  Patient name: [name] Medical record number: [id number] Date of birth: 26-Feb-1976 Age: 46 y.o. Gender: female  PCP: Kerin Perna, NP    Patient coming from: Home  I have reviewed all records in the [facility].    Chief Complaint:  Worsening lower leg swelling, fatigue  HPI: Patient is a 46 year old female with history of bariatric surgery, hypertension, alcoholic hepatitis, chronic anemia, PUD, who was recently (admitted 7/16-7/29 for hypovolemic shock due to gastroenteritis from norovirus.  She was found to have alcoholic hepatitis, anemia, aspiration pneumonia, stage IV pressure ulcer sacral region.  Patient was seen by GI and general surgery.  Endoscopy had shown marginal ulcer without stigmata of bleeding, esophageal plaques suspicious for candidiasis.  Colonoscopy was deferred.  She was placed on prednisolone 40 mg daily till 0/93 for alcoholic hepatitis, PPI, sucralfate for PUD.  Patient was recommended to follow-up with GI at [facility]. Patient returns back to ED with worsening lower extremity swelling.  She reported that she had the swelling when she left however feels that it is worsening and she has been feeling more fatigued.  She has been keeping 2 pillows under her lower extremities to keep them elevated.  ED work-up/course:  In ED, temp 97.8, respiratory rate 18, pulse 57, BP 107/49 CMET showed sodium 137, BUN 24, creatinine 1.5.  Creatinine 1.6 at the time of discharge on 7/28 Albumin 2.4.  AST 122, ALT 154, total protein 6.3, total bilirubin 6.4, alk phos 120 (Upon DC on 7/28, AST 121, ALT 116, albumin 2.1, alk phos 94, total bilirubin 6.9)   Review of Systems: Positives marked in 'bold' As mentioned in the history of present illness. All other systems reviewed  and are negative.   Past Medical History: Past Medical History:  Diagnosis Date   Acid reflux    Anemia    Ulcer     Past Surgical History: Past Surgical History:  Procedure Laterality Date   ABDOMINAL SURGERY     abdominoplasty   BIOPSY  04/18/2022   Procedure: BIOPSY;  Surgeon: Jackquline Denmark, MD;  Location: [facility] ENDOSCOPY;  Service: Gastroenterology;;   BREAST BIOPSY Right 2013   CHOLECYSTECTOMY N/A 11/23/2019   Procedure: LAPAROSCOPIC CHOLECYSTECTOMY WITH INTRAOPERATIVE CHOLANGIOGRAM;  Surgeon: Mickeal Skinner, MD;  Location: [place];  Service: General;  Laterality: N/A;   COSMETIC SURGERY     tummy tuck   ESOPHAGOGASTRODUODENOSCOPY (EGD) WITH PROPOFOL N/A 04/18/2022   Procedure: ESOPHAGOGASTRODUODENOSCOPY (EGD) WITH PROPOFOL;  Surgeon: Jackquline Denmark, MD;  Location: [place];  Service: Gastroenterology;  Laterality: N/A;   HERNIA REPAIR     ROUX-EN-Y PROCEDURE     TUMOR REMOVAL     WRIST GANGLION EXCISION      Medications: Prior to Admission medications   Medication Sig Start Date End Date Taking? Authorizing Provider  busPIRone (BUSPAR) 15 MG tablet Take 15 mg by mouth 3 (three) times daily as needed (anxiety). 06/09/21   [provider]  Cyanocobalamin (B-12 PO) Take 1 tablet by mouth daily.    [provider]  ferrous sulfate 325 (65  FE) MG tablet Take 1 tablet (325 mg total) by mouth daily with breakfast. 04/23/22   Danford, Suann Larry, MD  fluconazole (DIFLUCAN) 200 MG tablet Take 1 tablet (200 mg total) by mouth daily. 04/22/22   Danford, Suann Larry, MD  folic acid (FOLVITE) 1 MG tablet Take 1 tablet (1 mg total) by mouth daily. 04/22/22   Danford, Suann Larry, MD  loperamide (IMODIUM) 2 MG capsule Take 1 capsule (2 mg total) by mouth as needed for diarrhea or loose stools. 04/22/22   Danford, Suann Larry, MD  pantoprazole (PROTONIX) 40 MG tablet Take 1 tablet (40 mg total) by mouth 2 (two) times daily before a meal. 04/22/22   Danford,  Suann Larry, MD  prednisoLONE 5 MG TABS tablet Take 8 tablets (40 mg total) by mouth daily for 19 days. 04/23/22 05/12/22  Danford, Suann Larry, MD  prednisoLONE 5 MG TABS tablet Take 8 tablets (40 mg total) by mouth daily for 19 days. 04/22/22 05/11/22  Danford, Suann Larry, MD  sertraline (ZOLOFT) 100 MG tablet Take 100 mg by mouth as needed. 06/15/21   [provider]  sucralfate (CARAFATE) 1 GM/10ML suspension Take 10 mLs (1 g total) by mouth every 6 (six) hours. 04/22/22   Danford, Suann Larry, MD    Allergies:   Allergies  Allergen Reactions   Tape Rash    Clear tape    Social History:  reports that she has been smoking cigarettes. She has a 6.25 pack-year smoking history. She has never used smokeless tobacco. She reports that she does not currently use alcohol after a past usage of about 12.0 standard drinks of alcohol per week. She reports that she does not use drugs.  Family History: Family History  Problem Relation Age of Onset   Cancer Mother        kidney   Cancer Paternal Aunt    Breast cancer Maternal Aunt    Pancreatic cancer Maternal Aunt    Lung cancer Paternal Uncle     Physical Exam: Blood pressure (!) 107/49, pulse (!) 57, temperature 97.8 F (36.6 C), temperature source Oral, resp. rate 18, last menstrual period 03/10/2022, SpO2 100 %. General: Alert, awake, oriented x3, in no acute distress. Eyes: pink conjunctiva, icteric sclera, pupils equal and reactive to light and accomodation, HEENT: normocephalic, atraumatic, oropharynx clear Neck: supple, no masses or lymphadenopathy, no goiter, no bruits, no JVD CVS: Regular rate and rhythm, without murmurs, rubs or gallops.  Resp : Clear to auscultation bilaterally, no wheezing, rales or rhonchi. GI : Soft, nontender, nondistended, positive bowel sounds.  Musculoskeletal: No clubbing or cyanosis, positive pedal pulses. No contracture.  2-3+ pitting edema.  Anasarca, ROM intact  Neuro: Grossly intact,  no focal neurological deficits, strength 5/5 upper and lower extremities bilaterally Psych: alert and oriented x 3, normal mood and affect Skin: Stage IV sacral wound   LABS on Admission: I have personally reviewed all the labs and imagings below    Basic Metabolic Panel: Recent Labs  Lab 04/20/22 0318 04/21/22 0418 04/22/22 0112 04/24/22 1802  NA 138   < > 138 137  K 3.9   < > 3.6 4.1  CL 104   < > 106 105  CO2 24   < > 24 20*  GLUCOSE 125*   < > 128* 141*  BUN 38*   < > 29* 24*  CREATININE 1.54*   < > 1.58* 1.50*  CALCIUM 8.9   < > 8.9 8.8*  MG 2.0  --   --   --    < > =  values in this interval not displayed.   Liver Function Tests: Recent Labs  Lab 04/22/22 0112 04/25/22 0539  AST 147* 122*  ALT 142* 154*  ALKPHOS 96 120  BILITOT 9.7* 6.4*  PROT 7.3 6.3*  ALBUMIN 2.5* 2.4*   No results for input(s): "LIPASE", "AMYLASE" in the last 168 hours. No results for input(s): "AMMONIA" in the last 168 hours. CBC: Recent Labs  Lab 04/22/22 0112 04/25/22 0539  WBC 20.8* 18.6*  NEUTROABS  --  15.7*  HGB 8.6* 7.3*  HCT 25.7* 21.9*  MCV 104.9* 107.4*  PLT 61* 86*   Cardiac Enzymes: No results for input(s): "CKTOTAL", "CKMB", "CKMBINDEX", "TROPONINI" in the last 168 hours. BNP: Invalid input(s): "POCBNP" CBG: Recent Labs  Lab 04/22/22 0807 04/22/22 1229  GLUCAP 116* 172*      EKG: Independently reviewed.  No new EKG from today   Assessment/Plan Principal Problem: Acute on chronic anemia, anemia of chronic disease -Patient underwent extensive work-up during recent admission.  She reported no frank blood loss since discharge.  EGD had shown marginal ulcer with no stigmata of acute bleeding, colonoscopy was deferred.  She also was noted to have hemorrhoids and likely some slow GI blood loss from the marginal ulcer.  Possibly bone marrow suppression from alcohol.  Patient was followed by hematology and GI.  Patient had received Aranesp on 7/28 -Hemoglobin 7.3  today, was 8.6 on discharge on 7/29, will transfuse 1 unit packed RBCs   Active Problems: Anasarca, possibly due to hepatorenal syndrome, hypoalbuminemia underlying alcoholic hepatitis -Creatinine 1.5 on admission, was 1.58 at the time of discharge. -Will place on IV albumin and IV Lasix, follow renal function closely -Will benefit from unna boots, states TED hose was too tight  Alcoholic hepatitis -Continue prednisolone as recommended by GI during previous admission, was recommended 40 mg daily until 8/18 -Follow-up with GI outpatient  PUD/marginal ulcer on recent EGD -Continue PPI, Carafate -Will continue Diflucan, was recommended total 10 days  Chronic thrombocytopenia -Likely due to bone marrow suppression, alcoholic liver disease -Platelets count improving, 61K on discharge on 7/29, currently 86K -Continue to monitor  Chronic kidney disease stage II -Baseline creatinine was 0.9, during previous admission, had presented with creatinine of 6.38 on 7/16, improved to 1.58 on discharge, currently improving -Creatinine 1.5 on admission today.  - Continue albumin, IV Lasix monitor renal function closely.  Stage IV pressure ulcer of sacral region - will consult wound care  DVT prophylaxis: SCDs  CODE STATUS: Discussed in detail with the patient, she opted to be full CODE STATUS  Consults called: None  Family Communication: Admission, patients condition and plan of care including tests being ordered have been discussed with the patient  who indicates understanding and agree with the plan and Code Status  Admission status:   The medical decision making on this patient was of high complexity and the patient is at high risk for clinical deterioration, therefore this is a level 3 admission.  Severity of Illness:      The appropriate patient status for this patient is OBSERVATION. Observation status is judged to be reasonable and necessary in order to provide the required intensity  of service to ensure the patient's safety. The patient's presenting symptoms, physical exam findings, and initial radiographic and laboratory data in the context of their medical condition is felt to place them at decreased risk for further clinical deterioration. Furthermore, it is anticipated that the patient will be medically stable for discharge from the hospital within 2  midnights of admission.    Time Spent on Admission: 61mns      Ripudeep Rai M.D. [facility] 04/25/2022, 8:17 AM

## 2022-04-26 ENCOUNTER — Observation Stay (HOSPITAL_COMMUNITY): Payer: Self-pay

## 2022-04-26 ENCOUNTER — Inpatient Hospital Stay (HOSPITAL_COMMUNITY): Payer: Self-pay

## 2022-04-26 DIAGNOSIS — K7010 Alcoholic hepatitis without ascites: Secondary | ICD-10-CM

## 2022-04-26 DIAGNOSIS — K7011 Alcoholic hepatitis with ascites: Secondary | ICD-10-CM

## 2022-04-26 DIAGNOSIS — K289 Gastrojejunal ulcer, unspecified as acute or chronic, without hemorrhage or perforation: Secondary | ICD-10-CM

## 2022-04-26 DIAGNOSIS — R7989 Other specified abnormal findings of blood chemistry: Secondary | ICD-10-CM

## 2022-04-26 LAB — COMPREHENSIVE METABOLIC PANEL
ALT: 143 U/L — ABNORMAL HIGH (ref 0–44)
AST: 103 U/L — ABNORMAL HIGH (ref 15–41)
Albumin: 2.8 g/dL — ABNORMAL LOW (ref 3.5–5.0)
Alkaline Phosphatase: 99 U/L (ref 38–126)
Anion gap: 11 (ref 5–15)
BUN: 21 mg/dL — ABNORMAL HIGH (ref 6–20)
CO2: 22 mmol/L (ref 22–32)
Calcium: 8.6 mg/dL — ABNORMAL LOW (ref 8.9–10.3)
Chloride: 106 mmol/L (ref 98–111)
Creatinine, Ser: 1.5 mg/dL — ABNORMAL HIGH (ref 0.44–1.00)
GFR, Estimated: 44 mL/min — ABNORMAL LOW (ref >60)
Glucose, Bld: 142 mg/dL — ABNORMAL HIGH (ref 70–99)
Potassium: 3.2 mmol/L — ABNORMAL LOW (ref 3.5–5.1)
Sodium: 139 mmol/L (ref 135–145)
Total Bilirubin: 6.5 mg/dL — ABNORMAL HIGH (ref 0.3–1.2)
Total Protein: 6.2 g/dL — ABNORMAL LOW (ref 6.5–8.1)

## 2022-04-26 LAB — IRON AND TIBC
Iron: 52 ug/dL (ref 28–170)
Saturation Ratios: 34 % — ABNORMAL HIGH (ref 10.4–31.8)
TIBC: 153 ug/dL — ABNORMAL LOW (ref 250–450)
UIBC: 101 ug/dL

## 2022-04-26 LAB — CBC
HCT: 24.4 % — ABNORMAL LOW (ref 36.0–46.0)
Hemoglobin: 8.2 g/dL — ABNORMAL LOW (ref 12.0–15.0)
MCH: 34.6 pg — ABNORMAL HIGH (ref 26.0–34.0)
MCHC: 33.6 g/dL (ref 30.0–36.0)
MCV: 103 fL — ABNORMAL HIGH (ref 80.0–100.0)
Platelets: 83 10*3/uL — ABNORMAL LOW (ref 150–400)
RBC: 2.37 MIL/uL — ABNORMAL LOW (ref 3.87–5.11)
WBC: 13.2 10*3/uL — ABNORMAL HIGH (ref 4.0–10.5)
nRBC: 0.4 % — ABNORMAL HIGH (ref 0.0–0.2)

## 2022-04-26 LAB — TYPE AND SCREEN
ABO/RH(D): B POS
Antibody Screen: NEGATIVE
Unit division: 0

## 2022-04-26 LAB — BPAM RBC
Blood Product Expiration Date: 202309012359
ISSUE DATE / TIME: 202308010918
Unit Type and Rh: 7300

## 2022-04-26 LAB — FOLATE: Folate: >40 ng/mL (ref >5.9)

## 2022-04-26 LAB — BILIRUBIN, DIRECT: Bilirubin, Direct: 3.5 mg/dL — ABNORMAL HIGH (ref 0.0–0.2)

## 2022-04-26 LAB — RETICULOCYTES
Immature Retic Fract: 23.1 % — ABNORMAL HIGH (ref 2.3–15.9)
RBC.: 2.65 MIL/uL — ABNORMAL LOW (ref 3.87–5.11)
Retic Count, Absolute: 134.1 10*3/uL (ref 19.0–186.0)
Retic Ct Pct: 5.1 % — ABNORMAL HIGH (ref 0.4–3.1)

## 2022-04-26 LAB — FERRITIN: Ferritin: 477 ng/mL — ABNORMAL HIGH (ref 11–307)

## 2022-04-26 LAB — VITAMIN B12: Vitamin B-12: 3376 pg/mL — ABNORMAL HIGH (ref 180–914)

## 2022-04-26 MED ORDER — MIDODRINE HCL 5 MG PO TABS
10.0000 mg | ORAL_TABLET | Freq: Three times a day (TID) | ORAL | Status: DC
  Administered 2022-04-26 – 2022-04-29 (×10): 10 mg via ORAL
  Filled 2022-04-26 (×10): qty 2

## 2022-04-26 MED ORDER — FUROSEMIDE 10 MG/ML IJ SOLN
40.0000 mg | Freq: Two times a day (BID) | INTRAMUSCULAR | Status: DC
  Administered 2022-04-26: 40 mg via INTRAVENOUS
  Filled 2022-04-26: qty 4

## 2022-04-26 MED ORDER — POTASSIUM CHLORIDE CRYS ER 20 MEQ PO TBCR
40.0000 meq | EXTENDED_RELEASE_TABLET | Freq: Two times a day (BID) | ORAL | Status: AC
  Administered 2022-04-26 (×2): 40 meq via ORAL
  Filled 2022-04-26 (×2): qty 2

## 2022-04-26 MED ORDER — SPIRONOLACTONE 25 MG PO TABS
25.0000 mg | ORAL_TABLET | Freq: Every day | ORAL | Status: DC
  Administered 2022-04-26 – 2022-04-28 (×3): 25 mg via ORAL
  Filled 2022-04-26 (×3): qty 1

## 2022-04-26 MED ORDER — PHYTONADIONE 5 MG PO TABS
10.0000 mg | ORAL_TABLET | Freq: Every day | ORAL | Status: DC
Start: 2022-04-26 — End: 2022-04-29
  Administered 2022-04-26 – 2022-04-29 (×4): 10 mg via ORAL
  Filled 2022-04-26 (×4): qty 2

## 2022-04-26 NOTE — TOC Initial Note (Signed)
Transition of Care [facility]) - Initial/Assessment Note    Patient Details  Name: [name] MRN: [id number] Date of Birth: Jun 20, 1976  Transition of Care [facility]) CM/SW Contact:    Harriet Masson, RN Phone Number: 04/26/2022, 1:52 PM  Clinical Narrative:                 Spoke to patient regarding transition needs. Patient states Web designer (char[employer]) was going to come out but she had to come back to the hospital for swelling and weeping foot.  Spoke to [place] with  [employer] and he stated if the charity agency that is active when patient is discharged declines referral he will accept patient again. PT recommended OP rehab but patient needs home health RN to help with wound. Patient states she lives with family. Requested bedside RN to teach family dressing change.   TOC will continue to follow for needs.  Expected Discharge Plan: Home w Home Health Services Barriers to Discharge: Continued Medical Work up   Patient Goals and CMS Choice Patient states their goals for this hospitalization and ongoing recovery are:: return home      Expected Discharge Plan and Services Expected Discharge Plan: Home w Home Health Services                                              Prior Living Arrangements/Services              Need for Family Participation in Patient Care: Yes (Comment) Care giver support system in place?: Yes (comment)   Criminal Activity/Legal Involvement Pertinent to Current Situation/Hospitalization: No - Comment as needed  Activities of Daily Living Home Assistive Devices/Equipment: None ADL Screening (condition at time of admission) Patient's cognitive ability adequate to safely complete daily activities?: Yes Is the patient deaf or have difficulty hearing?: No Does the patient have difficulty seeing, even when wearing glasses/contacts?: No Does the patient have difficulty concentrating, remembering, or making decisions?: No Patient able to express need for  assistance with ADLs?: Yes Does the patient have difficulty dressing or bathing?: No Independently performs ADLs?: Yes (appropriate for developmental age) Does the patient have difficulty walking or climbing stairs?: Yes Weakness of Legs: Both Weakness of Arms/Hands: None  Permission Sought/Granted                  Emotional Assessment   Attitude/Demeanor/Rapport: Engaged Affect (typically observed): Accepting Orientation: : Oriented to Place, Oriented to Self, Oriented to Situation, Oriented to  Time Alcohol / Substance Use: Not Applicable Psych Involvement: No (comment)  Admission diagnosis:  Peripheral edema [R60.9] Anemia [D64.9] Anemia, unspecified type [D64.9] Patient Active Problem List   Diagnosis Date Noted   Anemia 04/25/2022   Metabolic acidosis 04/19/2022   Hypokalemia 04/19/2022   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HCC) 04/19/2022   Acute respiratory failure with hypoxia (HCC) 04/19/2022   Aspiration pneumonia (HCC) 04/19/2022   Anasarca 04/19/2022   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HCC) 04/19/2022   Marginal ulcer 04/19/2022   Obesity (BMI 30-39.9) 04/19/2022   Acute on chronic anemia 04/17/2022   Hypovolemic shock (HCC) 04/17/2022   Thrombocytopenia (HCC) 04/17/2022   Alcoholic hepatitis 04/17/2022   Gastroenteritis due to norovirus 04/17/2022   AKI (acute kidney injury) (HCC) 04/10/2022   Iron deficiency anemia 07/07/2021   Acute cholecystitis due to biliary calculus 11/22/2019   Enlarged lymph nodes  in armpit 08/01/2012   PCP:  [name], NP Pharmacy:   [id number] - [place], [place] - [address] [place] [place] [address] Phone: [phone] Fax: [phone]  [facility] [address] [place] [address] Phone: [phone] Fax: [phone]     Social Determinants of Health (SDOH) Interventions    Readmission Risk  Interventions     No data to display

## 2022-04-26 NOTE — Progress Notes (Signed)
Orthopedic Tech Progress Note Patient Details:  [name] 06-May-1976 [id number]  Ortho Devices Type of Ortho Device: Radio broadcast assistant, Ace wrap Ortho Device/Splint Location: BLE Ortho Device/Splint Interventions: Ordered, Application, Adjustment   Post Interventions Patient Tolerated: Well Instructions Provided: Care of device  [name] 04/26/2022, 1:23 PM

## 2022-04-26 NOTE — Evaluation (Signed)
Physical Therapy Evaluation Patient Details Name: [name] MRN: [id number] DOB: 02/03/1976 Today's Date: 04/26/2022  History of Present Illness  46 yo female presents to [facility] on 7/31 with LE edema, tarry stools. Recept admission 7/16-7/29 for hypovolemic shock due to gastroenteritis due to norovirus, alcoholic hepatitis, sacral ulcer. PMH includes bariatric surgery, HTN, alcoholic hepatitis, chronic anemia, PUD.  Clinical Impression   Pt presents with bilat LE swelling which is affecting dynamic balance, impaired activity tolerance, generalized weakness. Pt to benefit from acute PT to address deficits. Pt ambulated hallway distance without AD, occasionally requires PT steadying assist when PT challenging pt's dynamic balance. Pt will have brother to assist her at home if needed, recommend OPPT to address deficits. PT to progress mobility as tolerated, and will continue to follow acutely.         Recommendations for follow up therapy are one component of a multi-disciplinary discharge planning process, led by the attending physician.  Recommendations may be updated based on patient status, additional functional criteria and insurance authorization.  Follow Up Recommendations Outpatient PT      Assistance Recommended at Discharge Set up Supervision/Assistance  Patient can return home with the following  A little help with walking and/or transfers;A little help with bathing/dressing/bathroom;Assist for transportation;Help with stairs or ramp for entrance    Equipment Recommendations None recommended by PT  Recommendations for Other Services       Functional Status Assessment Patient has had a recent decline in their functional status and demonstrates the ability to make significant improvements in function in a reasonable and predictable amount of time.     Precautions / Restrictions Precautions Precautions: Fall Restrictions Weight Bearing Restrictions: No      Mobility   Bed Mobility Overal bed mobility: Modified Independent             General bed mobility comments: sitting EOB    Transfers Overall transfer level: Needs assistance Equipment used: None Transfers: Sit to/from Stand Sit to Stand: Supervision           General transfer comment: for safety, especially when transitioning stand>sit to ensure pt sat far enough back on bed    Ambulation/Gait Ambulation/Gait assistance: Supervision, Min assist Gait Distance (Feet): 250 Feet Assistive device: None Gait Pattern/deviations: Step-through pattern, Decreased stride length, Drifts right/left Gait velocity: decr     General Gait Details: supervision for safety during normal gait, occasional light steadying assist when PT challenging pt's dynamic balance which pt attributes to bilat foot and LE swelling  Stairs            Wheelchair Mobility    Modified Rankin (Stroke Patients Only)       Balance Overall balance assessment: Needs assistance Sitting-balance support: No upper extremity supported, Feet supported Sitting balance-Leahy Scale: Good     Standing balance support: No upper extremity supported, During functional activity Standing balance-Leahy Scale: Fair                 High Level Balance Comments: weaving of gait with L/R headturns, lateral unsteadiness requiring PT assist during step over object and step around object             Pertinent Vitals/Pain Pain Assessment Pain Assessment: 0-10 Pain Score: 10-Worst pain ever Pain Location: buttocks Pain Descriptors / Indicators: Sore Pain Intervention(s): Monitored during session, Limited activity within patient's tolerance, Patient requesting pain meds-RN notified    Home Living Family/patient expects to be discharged to:: Private residence Living Arrangements: Other (Comment) (  brother) Available Help at Discharge: Family Type of Home: Apartment Home Access: Stairs to enter Entrance  Stairs-Rails: Doctor, general practice of Steps: flight   Home Layout: One level Home Equipment: None      Prior Function Prior Level of Function : Independent/Modified Independent             Mobility Comments: brother drives       Hand Dominance   Dominant Hand: Right    Extremity/Trunk Assessment   Upper Extremity Assessment Upper Extremity Assessment: Defer to OT evaluation    Lower Extremity Assessment Lower Extremity Assessment: Generalized weakness;RLE deficits/detail;LLE deficits/detail RLE Deficits / Details: weeping edema LLE Deficits / Details: weeping edema    Cervical / Trunk Assessment Cervical / Trunk Assessment: Normal  Communication   Communication: No difficulties  Cognition Arousal/Alertness: Awake/alert Behavior During Therapy: WFL for tasks assessed/performed Overall Cognitive Status: Within Functional Limits for tasks assessed                                          General Comments      Exercises     Assessment/Plan    PT Assessment Patient needs continued PT services  PT Problem List Decreased mobility;Decreased balance;Decreased knowledge of use of DME;Pain;Decreased strength;Decreased activity tolerance       PT Treatment Interventions DME instruction;Therapeutic activities;Gait training;Therapeutic exercise;Patient/family education;Stair training;Balance training;Functional mobility training;Neuromuscular re-education    PT Goals (Current goals can be found in the Care Plan section)  Acute Rehab PT Goals Patient Stated Goal: home PT Goal Formulation: With patient Time For Goal Achievement: 05/10/22 Potential to Achieve Goals: Good    Frequency Min 3X/week     Co-evaluation               AM-PAC PT "6 Clicks" Mobility  Outcome Measure Help needed turning from your back to your side while in a flat bed without using bedrails?: None Help needed moving from lying on your back to sitting  on the side of a flat bed without using bedrails?: None Help needed moving to and from a bed to a chair (including a wheelchair)?: None Help needed standing up from a chair using your arms (e.g., wheelchair or bedside chair)?: A Little Help needed to walk in hospital room?: A Little Help needed climbing 3-5 steps with a railing? : A Little 6 Click Score: 21    End of Session   Activity Tolerance: Patient tolerated treatment well Patient left: in bed;with call bell/phone within reach Nurse Communication: Mobility status PT Visit Diagnosis: Other abnormalities of gait and mobility (R26.89);Muscle weakness (generalized) (M62.81)    Time: 9485-4627 PT Time Calculation (min) (ACUTE ONLY): 20 min   Charges:   PT Evaluation $PT Eval Low Complexity: 1 Low         [name] S, PT DPT Acute Rehabilitation Services Pager [phone]  Office [phone]   [name] 04/26/2022, 10:39 AM

## 2022-04-26 NOTE — Progress Notes (Addendum)
PROGRESS NOTE                                                                                                                                                                                                             Patient Demographics:    [name], is a 46 y.o. female, DOB - 12/09/1975, [id number]  Outpatient Primary MD for the patient is Kerin Perna, NP    LOS - 0  Admit date - 04/24/2022    Chief Complaint  Patient presents with   Leg Swelling       Brief Narrative (HPI from H&P)   46 year old female with history of bariatric surgery, hypertension, alcoholic hepatitis, chronic anemia, PUD, who was recently (admitted 7/16-7/29 for hypovolemic shock due to gastroenteritis from norovirus.  She was found to have alcoholic hepatitis, anemia, aspiration pneumonia, stage IV pressure ulcer sacral region.  Patient was seen by GI and general surgery.  Endoscopy had shown marginal ulcer without stigmata of bleeding, esophageal plaques suspicious for candidiasis.  Colonoscopy was deferred.  She was placed on prednisolone 40 mg daily till [id number] for alcoholic hepatitis, PPI, sucralfate for PUD.  Patient was recommended to follow-up with GI at [facility].  Patient returns back to ED with worsening lower extremity swelling, 3+ edema, Anaemia, hypotension and admitted.   Subjective:    [name] today has, No headache, No chest pain, No abdominal pain - No Nausea, No new weakness tingling or numbness, no SOB   Assessment  & Plan :   Anasarca due to alcoholic cirrhosis and portal hypertension, also has CKD stage IIIa - She is in frank fluid overload with 3+ edema, continue Lasix and Aldactone, she has already received multiple doses of albumin and will withhold further unless needed, apply Unna boots, fluid restriction.  Her discriminant factor is above 32, continue prednisolone which she was started last  admission stop date 05/13/2022.  Obtain abdominal ultrasound to rule out ascites and any hydronephrosis.  GI will be consulted.  She is claiming that she has quit alcohol 4 weeks ago requested to continue to abstain.   Alcoholic hepatitis  - as above.   Acute on chronic anemia, anemia of chronic disease  -Patient underwent extensive work-up during recent admission.  She reported no frank blood loss since discharge.  EGD had shown marginal ulcer  with no stigmata of acute bleeding, colonoscopy was deferred.  She also was noted to have hemorrhoids and likely some slow GI blood loss from the marginal ulcer.  Possibly bone marrow suppression from alcohol.  Patient was followed by hematology and GI.  Patient had received Aranesp on 04/21/22, Received 1 unit of packed RBC on 04/25/2022, check anemia panel and monitor.  PUD/marginal ulcer on recent EGD  - Continue PPI, Carafate, continue Diflucan, was recommended total 10 days last admission.   Chronic thrombocytopenia  -Likely due to bone marrow suppression, alcoholic liver disease, monitor no signs of active bleeding   Chronic kidney disease stage 3A - Baseline creatinine was 0.9, during previous admission, had presented with creatinine of 6.38 on 7/16, fattening has plateaued around 1.5 after some fluid removal, likely this is her baseline.  Monitor with ongoing diuresis.   Stage IV pressure ulcer of sacral region  - will consult wound care      Condition - Extremely Guarded  Family Communication  :  None present  Code Status :  Full  Consults  :  GI  PUD Prophylaxis : PPI   Procedures  :     Abd [place] -       Disposition Plan  :    Status is: Observation DVT Prophylaxis  :    Place and maintain sequential compression device Start: 04/25/22 1424  Lab Results  Component Value Date   PLT 83 (L) 04/26/2022    Diet :  Diet Order             Diet Heart Room service appropriate? Yes; Fluid consistency: Thin; Fluid restriction: 1500 mL  Fluid  Diet effective now                    Inpatient Medications  Scheduled Meds:  ferrous sulfate  325 mg Oral Q breakfast   fluconazole  200 mg Oral Daily   folic acid  1 mg Oral Daily   furosemide  40 mg Intravenous BID   midodrine  10 mg Oral TID WC   pantoprazole  40 mg Oral BID AC   potassium chloride  40 mEq Oral BID   prednisoLONE  40 mg Oral Daily   sodium chloride flush  3 mL Intravenous Q12H   spironolactone  25 mg Oral Daily   sucralfate  1 g Oral Q6H   Continuous Infusions:  sodium chloride     PRN Meds:.sodium chloride, loperamide, ondansetron **OR** ondansetron (ZOFRAN) IV, sodium chloride flush, traMADol  Antibiotics  :    Anti-infectives (From admission, onward)    Start     Dose/Rate Route Frequency Ordered Stop   04/25/22 1430  fluconazole (DIFLUCAN) tablet 200 mg        200 mg Oral Daily 04/25/22 1423 05/02/22 0959        Time Spent in minutes  30   Susa Raring M.D on 04/26/2022 at 8:20 AM  To page go to [email or web address]   [facility] -  Office  [phone]  See all Orders from today for further details    Objective:   Vitals:   04/25/22 1722 04/25/22 2054 04/25/22 2336 04/26/22 0411  BP: (!) 110/58 (!) 103/58 102/63 (!) 93/48  Pulse: 67 (!) 59 61 60  Resp: 16 20 20 20   Temp: 98.5 F (36.9 C) 97.8 F (36.6 C) 98.3 F (36.8 C) 97.8 F (36.6 C)  TempSrc: Oral Oral Oral Oral  SpO2:  99% 98% 97%  Wt Readings from Last 3 Encounters:  04/22/22 100.4 kg  01/18/22 99.2 kg  09/30/21 104.5 kg    No intake or output data in the 24 hours ending 04/26/22 0820   Physical Exam  Awake Alert, No new F.N deficits, Normal affect .AT,PERRAL Supple Neck, No JVD,   Symmetrical Chest wall movement, Good air movement bilaterally, CTAB RRR,No Gallops,Rubs or new Murmurs,  +ve B.Sounds, Abd Soft, No tenderness,   3+ edema   Data Review:    CBC Recent Labs  Lab 04/20/22 0318 04/21/22 0418 04/22/22 0112 04/25/22 0539  04/26/22 0459  WBC 15.1* 16.1* 20.8* 18.6* 13.2*  HGB 7.2* 7.0* 8.6* 7.3* 8.2*  HCT 21.7* 20.4* 25.7* 21.9* 24.4*  PLT 53* 47* 61* 86* 83*  MCV 112.4* 111.5* 104.9* 107.4* 103.0*  MCH 37.3* 38.3* 35.1* 35.8* 34.6*  MCHC 33.2 34.3 33.5 33.3 33.6  RDW Not Measured Not Measured Not Measured Not Measured Not Measured  LYMPHSABS 2.1 1.8  --  1.6  --   MONOABS 0.9 0.9  --  1.1*  --   EOSABS 0.0 0.0  --  0.0  --   BASOSABS 0.0 0.0  --  0.0  --     Electrolytes Recent Labs  Lab 04/20/22 0318 04/21/22 0418 04/22/22 0112 04/24/22 1802 04/25/22 0539 04/25/22 1627 04/26/22 0459  NA 138 137 138 137  --   --  139  K 3.9 3.6 3.6 4.1  --   --  3.2*  CL 104 106 106 105  --   --  106  CO2 24 24 24  20*  --   --  22  GLUCOSE 125* 131* 128* 141*  --   --  142*  BUN 38* 34* 29* 24*  --   --  21*  CREATININE 1.54* 1.61* 1.58* 1.50*  --  1.44* 1.50*  CALCIUM 8.9 8.6* 8.9 8.8*  --   --  8.6*  AST 127* 121* 147*  --  122*  --  103*  ALT 112* 116* 142*  --  154*  --  143*  ALKPHOS 100 94 96  --  120  --  99  BILITOT 7.6* 6.9* 9.7*  --  6.4*  --  6.5*  ALBUMIN 2.2* 2.1* 2.5*  --  2.4*  --  2.8*  MG 2.0  --   --   --   --   --   --   INR  --   --   --   --  1.5*  --   --        Radiology Reports No results found.

## 2022-04-26 NOTE — Progress Notes (Signed)
  Transition of Care [facility]) Screening Note   Patient Details  Name: [name] Date of Birth: [birth year]   Transition of Care [facility]) CM/SW Contact:    Harriet Masson, RN Phone Number: 04/26/2022, 8:23 AM  Patient recently hospitalized 7/16-7/29 for hypovolemic shock due to gastroenteritis from norovirus, Currently active with Home health - RN [employer], ,charity.  Transition of Care Department [facility]) has reviewed patient and no ToC needs have been identified at this time. We will continue to monitor patient advancement through interdisciplinary progression rounds. If new patient transition needs arise, please place a TOC consult.

## 2022-04-26 NOTE — Consult Note (Signed)
[facility] Consult: 8:36 AM 04/26/2022  LOS: 0 days    Referring Provider: Dr Burney Gauze.    Primary Care Physician:  Lori Sessions, NP Primary Gastroenterologist:  unassigned.  Lori Coho PA, [name] [facility].     Reason for Consultation:  ETOH hepatitis.     HPI: [name] is a 46 y.o. female.    PMH Obesity.  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  2021 lap chole.   Panniculitis, panniculectomy.  Internal hernia repair 09/2010. 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  Iron deficiency anemia.  Vitamin B1,  Vit  B 12, vitamin D deficient.  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  Last IV iron infusion 06/2021. No prior PRBCs EtOH hepatitis in 2021, 2022, 03/2022. Care everywhere records indicate chronic marginal/anastomotic ulcers w multiple previous EGDs.  Twice 2011 (multiple small ulcers at jejunal side of pouch with normal gastric pouch. Twice in 2012 (anastomotic ulcer), once 2013 (anastomotic ulcer 15 mm sized), twice 2014, once 2017 (report not found) . Latest EGD 09/2018 (only path reprt found: Gastric mucosa with no diagnostic abnormality, no H. Pylori) . Several upper GI studies, the last in 2014 and did not demonstrate leak or stricture. Prior to her recent admission she had not been taking PPI or other acid controlling medication.  Admission 7/16-7/29/23 for diarrhea, jaundice, hypovolemia, weakness, severe hypokalemia..  Stool pathogen panel negative.  Initiated prednisolone 7/19 for ETOH hepatitis.  + AKI.  Shock requiring pressors.  L sided PNA/airspace dz. stage IV gluteal fold decubitus. Baseline ETOH use: 4 shots of alcohol daily.  Goes thru a pint of Vodka every 2 to 3 days. Dr. Chales Abrahams  consulted Re: Anemia.  Hgb 6 (8.6 at discharge).  Received 3 PRBCs.  Thrombocytopenia with platelets 47 K.  AKI. 04/10/22 Ultrasound abdomen  Prior cholecystectomy, diffusely echogenic liver cw diffuse fatty changes, normal ducts.  Unable to identify CBD.  PV Dopplers normal. 04/10/22 CTAP wo contrast:  changes of R n Y.  Previous cholecystectomy.  Severe fatty liver.  2 cm structure at medial left lobe of liver w similar attenuation to that of liver parenchyma, likely represents hepatic tissue.  Mild pancreatic haziness at uncinate likely artifact and less likely mild pancreatitis (Lipase was 267).  Aortic atherosclerosis. 04/18/2022 EGD: Patchy white plaques in esophagus.  Small HH.  Friable gastritis.  Nonbleeding, 1 x 1.5 cm marginal ulcer GJ anastomosis.  This was clean-based with erythematous margins and located at distal portion of anastomosis.  Biopsies obtained.  Examined jejunum normal.  Treated with Protonix 40 mg Carafate suspension 4 times daily for 2 weeks.  Esophageal pathology: erosive esophagitis, candidial esophagitis.  No features of eosinophilic esophagitis, no dysplasia or malignancy.  Biopsies of the marginal ulcer site showed 9 gastric ulcer with representative changes.  Minute focal site of intestinal metaplasia without dysplasia.  Jejunal mucosa with nonspecific inflammation.  No H. Pylori Discharge meds included iron sulfate 325 daily pantoprazole 40 bid, prednisolone for 19 days  sucralfate suspension 1 g q 6 h. She has complied w all these except not taking Prednisolone as this was not available at the pharmacy until early this week.    Stools are greenish,  loose and formed.  3 yesterday.  Denies abdominal pain, nausea/vomiting, anorexia.  Says she is eating well.  Bruising a bit easily but no other unusual bleeding.  Denies dyspnea.  Legs feel very heavy and tight.  Denies fever.  Denies use of alcohol since discharge  Returned [facility] to [facility] ED on 7/31 to address lower extremity  edema.  + anasarca. She is unable to wear prescribed compression stockings.   She was hypotensive.   Hgb 8.2.  MCV 103.  Platelets 83 K.  WBCs 18.6.  T. bili 6.5 (max 13.2 on 7/20).  Alkaline phosphatase 120 (never elevated).  AST/ALT 122/154... 103/43 (max 150/104 on 7/20).  Lipase 103.   INR (max 1.82 weeks ago). Ultrasound abdomen with hepatic steatosis and mildly nodular liver concerning for cirrhosis.  Trace perihepatic ascites.  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  Based out of [place].  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   Lives with her brother.  ETOH as above, abstinent since dc last week.   Family hx  renal cell carcinoma in mother.  Aunt with breast cancer, aunt with pancreatic cancer, uncle with lung cancer.    Past Medical History:  Diagnosis Date   Acid reflux    Anemia    Ulcer     Past Surgical History:  Procedure Laterality Date   ABDOMINAL SURGERY     abdominoplasty   BIOPSY  04/18/2022   Procedure: BIOPSY;  Surgeon: Lynann Bologna, MD;  Location: [facility] ENDOSCOPY;  Service: Gastroenterology;;   BREAST BIOPSY Right 2013   CHOLECYSTECTOMY N/A 11/23/2019   Procedure: LAPAROSCOPIC CHOLECYSTECTOMY WITH INTRAOPERATIVE CHOLANGIOGRAM;  Surgeon: Rodman Pickle, MD;  Location: [facility] OR;  Service: General;  Laterality: N/A;   COSMETIC SURGERY     tummy tuck   ESOPHAGOGASTRODUODENOSCOPY (EGD) WITH PROPOFOL N/A 04/18/2022   Procedure: ESOPHAGOGASTRODUODENOSCOPY (EGD) WITH PROPOFOL;  Surgeon: Lynann Bologna, MD;  Location[facility] ENDOSCOPY;  Service: Gastroenterology;  Laterality: N/A;   HERNIA REPAIR     ROUX-EN-Y PROCEDURE     TUMOR REMOVAL     WRIST GANGLION EXCISION      Prior to Admission medications   Medication Sig Start Date End Date Taking? Authorizing Provider  busPIRone (BUSPAR) 15 MG tablet Take 15 mg by mouth 3 (three) times daily as needed (anxiety). 06/09/21  Yes  [provider]  Cyanocobalamin (B-12 PO) Take 1 tablet by mouth daily.   Yes [provider]  ferrous sulfate 325 (65 FE) MG tablet Take 1 tablet (325 mg total) by mouth daily with breakfast. 04/23/22  Yes Danford, Earl Lites, MD  fluconazole (DIFLUCAN) 200 MG tablet Take 1 tablet (200 mg total) by mouth daily. 04/22/22  Yes Danford, Earl Lites, MD  folic acid (FOLVITE) 1 MG tablet Take 1 tablet (1 mg total) by mouth daily. 04/22/22  Yes Danford, Earl Lites, MD  loperamide (IMODIUM) 2 MG capsule Take 1 capsule (2 mg total) by mouth as needed for diarrhea or loose stools. 04/22/22  Yes Danford, Earl Lites, MD  pantoprazole (PROTONIX) 40 MG tablet Take 1 tablet (40 mg total) by mouth 2 (two) times daily before a meal. 04/22/22  Yes Danford, Earl Lites, MD  prednisoLONE 5  MG TABS tablet Take 8 tablets (40 mg total) by mouth daily for 19 days. 04/22/22 05/11/22 Yes Danford, Suann Larry, MD  sucralfate (CARAFATE) 1 GM/10ML suspension Take 10 mLs (1 g total) by mouth every 6 (six) hours. 04/22/22  Yes Danford, Suann Larry, MD  prednisoLONE 5 MG TABS tablet Take 8 tablets (40 mg total) by mouth daily for 19 days. Patient not taking: Reported on 04/25/2022 04/23/22 05/12/22  Edwin Dada, MD    Scheduled Meds:  ferrous sulfate  325 mg Oral Q breakfast   fluconazole  200 mg Oral Daily   folic acid  1 mg Oral Daily   furosemide  40 mg Intravenous BID   midodrine  10 mg Oral TID WC   pantoprazole  40 mg Oral BID AC   potassium chloride  40 mEq Oral BID   prednisoLONE  40 mg Oral Daily   sodium chloride flush  3 mL Intravenous Q12H   spironolactone  25 mg Oral Daily   sucralfate  1 g Oral Q6H   Infusions:  sodium chloride     PRN Meds: sodium chloride, loperamide, ondansetron **OR** ondansetron (ZOFRAN) IV, sodium chloride flush, traMADol   Allergies as of 04/24/2022 - Review Complete 04/24/2022  Allergen Reaction Noted   Tape Rash 04/11/2022    Family  History  Problem Relation Age of Onset   Cancer Mother        kidney   Cancer Paternal Aunt    Breast cancer Maternal Aunt    Pancreatic cancer Maternal Aunt    Lung cancer Paternal Uncle     Social History   Socioeconomic History   Marital status: Single    Spouse name: Not on file   Number of children: 1   Years of education: Not on file   Highest education level: Not on file  Occupational History    Employer: [employer]    Comment: [employer]  Tobacco Use   Smoking status: Every Day    Packs/day: 0.25    Years: 25.00    Total pack years: 6.25    Types: Cigarettes   Smokeless tobacco: Never  Vaping Use   Vaping Use: Never used  Substance and Sexual Activity   Alcohol use: Not Currently    Alcohol/week: 12.0 standard drinks of alcohol    Types: 12 Glasses of wine per week   Drug use: No   Sexual activity: Not on file  Other Topics Concern   Not on file  Social History Narrative   Not on file   Social Determinants of Health   Financial Resource Strain: Not on file  Food Insecurity: Not on file  Transportation Needs: Not on file  Physical Activity: Not on file  Stress: Not on file  Social Connections: Not on file  Intimate Partner Violence: Not on file    REVIEW OF SYSTEMS: Constitutional: No profound fatigue but some generalized weakness. ENT:  No nose bleeds Pulm: No shortness of breath and cough CV:  No palpitations, no angina.  Weeping lower extremity edema GU:  No hematuria, no frequency GI: See HPI Heme: See HPI Transfusions: See HPI. Neuro:  No headaches, no peripheral tingling or numbness.  No seizures, no sink Derm:  No itching, no rash or sores.  Endocrine:  No sweats or chills.  No polyuria or dysuria Immunization: Reviewed Travel:  None beyond local counties in last few months.    PHYSICAL EXAM: Vital signs in last 24 hours: Vitals:   04/26/22 0411 04/26/22 [id number]  BP: (!) 93/48 (!) 93/53  Pulse: 60 (!) 57  Resp: 20 18  Temp: 97.8 F (36.6 C)  98.6 F (37 C)  SpO2: 97%    Wt Readings from Last 3 Encounters:  04/22/22 100.4 kg  01/18/22 99.2 kg  09/30/21 104.5 kg    General: Obese, jaundiced, somewhat unwell but not acutely ill-appearing [facility]. Head: No facial asymmetry or swelling.  No sign of head trauma. Eyes: Icterus.  No conjunctival pallor.  EOMI Ears: No hearing deficit Nose: No congestion or discharge Mouth: Good dentition.  Oral mucosa moist, pink, clear.  Tongue midline. Neck: No JVD, no masses, no thyromegaly. Lungs: Clear bilaterally without labored breathing. Heart: RRR.  No MRG.  S1, S2 present Abdomen: BS, soft.  Active bowel sounds.  Suboptimal exam with patient in partially reclined position.  Did not appreciate hernia, organomegaly, bruits. Rectal: Deferred. Musc/Skeltl: No joint redness swelling or gross deformity. Extremities: Marked bilateral lower extremity edema a bit worse on the left with pitting and areas of weeping edema Neurologic: Oriented x3.  Moves all 4 limbs. Skin: No rash visible on limbs.  Purpura on the arms. Nodes: No cervical adenopathy Psych: Distant, disengaged affect.  Intake/Output from previous day: No intake/output data recorded. Intake/Output this shift: No intake/output data recorded.  LAB RESULTS: Recent Labs    04/25/22 0539 04/26/22 0459  WBC 18.6* 13.2*  HGB 7.3* 8.2*  HCT 21.9* 24.4*  PLT 86* 83*   BMET Lab Results  Component Value Date   NA 139 04/26/2022   NA 137 04/24/2022   NA 138 04/22/2022   K 3.2 (L) 04/26/2022   K 4.1 04/24/2022   K 3.6 04/22/2022   CL 106 04/26/2022   CL 105 04/24/2022   CL 106 04/22/2022   CO2 22 04/26/2022   CO2 20 (L) 04/24/2022   CO2 24 04/22/2022   GLUCOSE 142 (H) 04/26/2022   GLUCOSE 141 (H) 04/24/2022   GLUCOSE 128 (H) 04/22/2022   BUN 21 (H) 04/26/2022   BUN 24 (H) 04/24/2022   BUN 29 (H) 04/22/2022   CREATININE 1.50 (H) 04/26/2022   CREATININE 1.44 (H) 04/25/2022   CREATININE 1.50 (H) 04/24/2022   CALCIUM 8.6  (L) 04/26/2022   CALCIUM 8.8 (L) 04/24/2022   CALCIUM 8.9 04/22/2022   LFT Recent Labs    04/25/22 0539 04/26/22 0459  PROT 6.3* 6.2*  ALBUMIN 2.4* 2.8*  AST 122* 103*  ALT 154* 143*  ALKPHOS 120 99  BILITOT 6.4* 6.5*  BILIDIR 2.8*  --   IBILI 3.6*  --    PT/INR Lab Results  Component Value Date   INR 1.5 (H) 04/25/2022   INR 1.7 (H) 04/17/2022   INR 1.7 (H) 04/11/2022   Hepatitis Panel No results for input(s): "HEPBSAG", "HCVAB", "HEPAIGM", "HEPBIGM" in the last 72 hours. C-Diff No components found for: "CDIFF" Lipase     Component Value Date/Time   LIPASE 103 (H) 04/11/2022 0205    Drugs of Abuse  No results found for: "LABOPIA", "COCAINSCRNUR", "LABBENZ", "AMPHETMU", "THCU", "LABBARB"   RADIOLOGY STUDIES: No results found.   IMPRESSION:     ETOH hepatitis.  Recurrent.  Recent CT and ultrasound w severe fatty liver.  Current ultrasound raising concern for cirrhosis.  Prednisolone initiated 7/19, though RXd, not taking this as outpt (waiting on arrival of med to her pharmacy).   Overall LFTs down trending over levels from 2 and 3 weeks ago.      Blood loss anemia.  Recent transfusions within  last 3 weeks.  Hgb stable from dc level last week.      Acute on chronic recurrent bleeding from anastomotic ulcers, documented on multiple EGDs.    Severe esophagitis w candida esophagitis.   Outpt compliant w PPI, Carafate, diflucan. Continues on same     LE edema, dx as anasarca.  Albumin 2.8.  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       ETOH use disorder.      Elevated Lipase, was 267  3 weeks ago, 103 today.  Previous cholecystectomy.  CT 04/10/22: mild pancreatic haziness, possible pancreatitis.  Clinically no abd pain, good appetite and po tolerance.       Thrombocytopenia.  Platelets improved c/w last few weeks.  No splenomegaly on CT of 04/10/22  diet.    Coagulopathy.  Persists, improved c/w  a few weeks ago    Gluteal decubitus. Size 8 x 4 x 10 cm.  It's presence suggests pt is physically low functioning.  WOC following.    PLAN:       Supportive care.  Trend LFTs.  Dr Candiss Norse ordered an ultrasound abdomen.  Continue the current medical regimen including the Protonix, Carafate, oka.  Finish out total 30 days of prednisolone (last dose 8/17) and 10 days total Diflucan (finishes after doses tmrw 8/3).      Absolute/total ETOH abstinence.      Fup w GI at [facility].   Dr Lyndel Safe did not agree to accept pt for outpt fup.     Azucena Freed  04/26/2022, 8:36 AM Phone [phone]

## 2022-04-27 DIAGNOSIS — R1084 Generalized abdominal pain: Secondary | ICD-10-CM

## 2022-04-27 LAB — COMPREHENSIVE METABOLIC PANEL
ALT: 124 U/L — ABNORMAL HIGH (ref 0–44)
AST: 71 U/L — ABNORMAL HIGH (ref 15–41)
Albumin: 2.9 g/dL — ABNORMAL LOW (ref 3.5–5.0)
Alkaline Phosphatase: 100 U/L (ref 38–126)
Anion gap: 11 (ref 5–15)
BUN: 26 mg/dL — ABNORMAL HIGH (ref 6–20)
CO2: 22 mmol/L (ref 22–32)
Calcium: 7.4 mg/dL — ABNORMAL LOW (ref 8.9–10.3)
Chloride: 104 mmol/L (ref 98–111)
Creatinine, Ser: 1.69 mg/dL — ABNORMAL HIGH (ref 0.44–1.00)
GFR, Estimated: 38 mL/min — ABNORMAL LOW (ref >60)
Glucose, Bld: 117 mg/dL — ABNORMAL HIGH (ref 70–99)
Potassium: 4.1 mmol/L (ref 3.5–5.1)
Sodium: 137 mmol/L (ref 135–145)
Total Bilirubin: 6.4 mg/dL — ABNORMAL HIGH (ref 0.3–1.2)
Total Protein: 6 g/dL — ABNORMAL LOW (ref 6.5–8.1)

## 2022-04-27 LAB — CBC WITH DIFFERENTIAL/PLATELET
Abs Immature Granulocytes: 0.08 10*3/uL — ABNORMAL HIGH (ref 0.00–0.07)
Basophils Absolute: 0 10*3/uL (ref 0.0–0.1)
Basophils Relative: 0 %
Eosinophils Absolute: 0 10*3/uL (ref 0.0–0.5)
Eosinophils Relative: 0 %
HCT: 23.2 % — ABNORMAL LOW (ref 36.0–46.0)
Hemoglobin: 8.1 g/dL — ABNORMAL LOW (ref 12.0–15.0)
Immature Granulocytes: 1 %
Lymphocytes Relative: 9 %
Lymphs Abs: 1.3 10*3/uL (ref 0.7–4.0)
MCH: 35.4 pg — ABNORMAL HIGH (ref 26.0–34.0)
MCHC: 34.9 g/dL (ref 30.0–36.0)
MCV: 101.3 fL — ABNORMAL HIGH (ref 80.0–100.0)
Monocytes Absolute: 0.8 10*3/uL (ref 0.1–1.0)
Monocytes Relative: 6 %
Neutro Abs: 11.9 10*3/uL — ABNORMAL HIGH (ref 1.7–7.7)
Neutrophils Relative %: 84 %
Platelets: 97 10*3/uL — ABNORMAL LOW (ref 150–400)
RBC: 2.29 MIL/uL — ABNORMAL LOW (ref 3.87–5.11)
WBC: 14 10*3/uL — ABNORMAL HIGH (ref 4.0–10.5)
nRBC: 0.3 % — ABNORMAL HIGH (ref 0.0–0.2)

## 2022-04-27 LAB — MAGNESIUM: Magnesium: 1.3 mg/dL — ABNORMAL LOW (ref 1.7–2.4)

## 2022-04-27 LAB — PROTIME-INR
INR: 1.6 — ABNORMAL HIGH (ref 0.8–1.2)
Prothrombin Time: 19.1 seconds — ABNORMAL HIGH (ref 11.4–15.2)

## 2022-04-27 LAB — BRAIN NATRIURETIC PEPTIDE: B Natriuretic Peptide: 804 pg/mL — ABNORMAL HIGH (ref 0.0–100.0)

## 2022-04-27 MED ORDER — FUROSEMIDE 40 MG PO TABS
40.0000 mg | ORAL_TABLET | Freq: Once | ORAL | Status: AC
  Administered 2022-04-27: 40 mg via ORAL
  Filled 2022-04-27: qty 1

## 2022-04-27 MED ORDER — MAGNESIUM SULFATE 4 GM/100ML IV SOLN
4.0000 g | Freq: Once | INTRAVENOUS | Status: AC
  Administered 2022-04-27: 4 g via INTRAVENOUS
  Filled 2022-04-27: qty 100

## 2022-04-27 NOTE — Evaluation (Signed)
Occupational Therapy Evaluation Patient Details Name: [name] MRN: [id number] DOB: [birth year] Today's Date: 04/27/2022   History of Present Illness 46 yo female [facility] to [facility] on 7/[facility] with LE edema, tarry stools. Recept admission 7/16-7/29 for hypovolemic shock due to gastroenteritis due to norovirus, alcoholic hepatitis, sacral ulcer. PMH includes bariatric surgery, HTN, alcoholic hepatitis, chronic anemia, PUD.   Clinical Impression   [name] is a 46 year old woman admitted to hospital with above medical history. On evaluation she is independent with ADLs in room, demonstrates the ability to ambulate independently in room and with supervision in hall. With ambulation she sways but doesn't lose her balance. She demonstrated ability to carry food tray in room with both hands. Patient has no OT needs at this time.        Recommendations for follow up therapy are one component of a multi-disciplinary discharge planning process, led by the attending physician.  Recommendations may be updated based on patient status, additional functional criteria and insurance authorization.   Follow Up Recommendations  No OT follow up    Assistance Recommended at Discharge PRN  Patient can return home with the following      Functional Status Assessment  Patient has not had a recent decline in their functional status  Equipment Recommendations  None recommended by OT    Recommendations for Other Services       Precautions / Restrictions Precautions Precautions: Fall Restrictions Weight Bearing Restrictions: No      Mobility Bed Mobility Overal bed mobility: Modified Independent                  Transfers Overall transfer level: Independent                        Balance Overall balance assessment: Mild deficits observed, not formally tested                                         ADL either performed or assessed with clinical  judgement   ADL Overall ADL's : Independent                                             Vision Patient Visual Report: No change from baseline       Perception     Praxis      Pertinent Vitals/Pain Pain Assessment Pain Assessment: No/denies pain     Hand Dominance Right   Extremity/Trunk Assessment Upper Extremity Assessment Upper Extremity Assessment: Overall WFL for tasks assessed   Lower Extremity Assessment Lower Extremity Assessment: Defer to PT evaluation   Cervical / Trunk Assessment Cervical / Trunk Assessment: Normal   Communication Communication Communication: No difficulties   Cognition Arousal/Alertness: Awake/alert Behavior During Therapy: WFL for tasks assessed/performed Overall Cognitive Status: Within Functional Limits for tasks assessed                                       General Comments       Exercises     Shoulder Instructions      Home Living Family/patient expects to be discharged to:: Private residence Living Arrangements: Other (Comment) (brother)  Available Help at Discharge: Family Type of Home: Apartment Home Access: Stairs to enter Secretary/administrator of Steps: flight Entrance Stairs-Rails: Right;Left Home Layout: One level     Bathroom Shower/Tub: Chief Strategy Officer: Standard     Home Equipment: None          Prior Functioning/Environment Prior Level of Function : Independent/Modified Independent             Mobility Comments: brother drives          OT Problem List:        OT Treatment/Interventions:      OT Goals(Current goals can be found in the care plan section) Acute Rehab OT Goals OT Goal Formulation: All assessment and education complete, DC therapy  OT Frequency:      Co-evaluation              AM-PAC OT "6 Clicks" Daily Activity     Outcome Measure Help from another person eating meals?: None Help from another person taking  care of personal grooming?: None Help from another person toileting, which includes using toliet, bedpan, or urinal?: None Help from another person bathing (including washing, rinsing, drying)?: None Help from another person to put on and taking off regular upper body clothing?: None Help from another person to put on and taking off regular lower body clothing?: None 6 Click Score: 24   End of Session Equipment Utilized During Treatment: Gait belt Nurse Communication: Mobility status  Activity Tolerance: Patient tolerated treatment well Patient left: in bed  OT Visit Diagnosis: Unsteadiness on feet (R26.81)                Time: 5520-8022 OT Time Calculation (min): 7 min Charges:  OT General Charges $OT Visit: 1 Visit OT Evaluation $OT Eval Low Complexity: 1 Low  Amaar Oshita, OTR/L Acute Care Rehab Services  Office [phone] Pager: [phone]   Kelli Churn 04/27/2022, 9:43 AM

## 2022-04-27 NOTE — Plan of Care (Signed)
  Problem: Education: Goal: Knowledge of General Education information will improve Description: Including pain rating scale, medication(s)/side effects and non-pharmacologic comfort measures Outcome: Progressing   Problem: Activity: Goal: Risk for activity intolerance will decrease Outcome: Progressing   Problem: Nutrition: Goal: Adequate nutrition will be maintained Outcome: Progressing   Problem: Coping: Goal: Level of anxiety will decrease Outcome: Progressing   

## 2022-04-27 NOTE — Progress Notes (Signed)
PROGRESS NOTE                                                                                                                                                                                                             Patient Demographics:    [name], is a 46 y.o. female, DOB - Jan 26, 1976, XIP:[id number]  Outpatient Primary MD for the patient is [name], NP    LOS - 1  Admit date - 04/24/2022    Chief Complaint  Patient presents with   Leg Swelling       Brief Narrative (HPI from H&P)   46 year old female with history of bariatric surgery, hypertension, alcoholic hepatitis, chronic anemia, PUD, who was recently (admitted 7/16-7/29 for hypovolemic shock due to gastroenteritis from norovirus.  She was found to have alcoholic hepatitis, anemia, aspiration pneumonia, stage IV pressure ulcer sacral region.  Patient was seen by GI and general surgery.  Endoscopy had shown marginal ulcer without stigmata of bleeding, esophageal plaques suspicious for candidiasis.  Colonoscopy was deferred.  She was placed on prednisolone 40 mg daily till 8/19 for alcoholic hepatitis, PPI, sucralfate for PUD.  Patient was recommended to follow-up with GI at [facility].  Patient returns back to ED with worsening lower extremity swelling, 3+ edema, Anaemia, hypotension and admitted.   Subjective:   Patient in bed, appears comfortable, denies any headache, no fever, no chest pain or pressure, no shortness of breath , no abdominal pain. No new focal weakness.   Assessment  & Plan :   Anasarca due to alcoholic cirrhosis and portal hypertension, also has CKD stage IIIa - She is in frank fluid overload with 3+ edema, continue Lasix and Aldactone, she has already received multiple doses of albumin and will withhold further unless needed, applied Unna boots, fluid restriction.  Edema is improving.  Her discriminant factor is above  32, continue prednisolone which she was started last admission stop date 05/13/2022, also continue fluconazole that was ordered for 10 days per recommendations by GI last admission.  Nonacute abdominal ultrasound with trace ascites and stable appearing kidneys with no hydronephrosis.  GI is following.  She is claiming that she has quit alcohol 4 weeks ago requested to continue to abstain.   Alcoholic hepatitis  - as above.   Acute on chronic anemia, anemia of chronic  disease  -Patient underwent extensive work-up during recent admission.  She reported no frank blood loss since discharge.  EGD had shown marginal ulcer with no stigmata of acute bleeding, colonoscopy was deferred.  She also was noted to have hemorrhoids and likely some slow GI blood loss from the marginal ulcer.  Possibly bone marrow suppression from alcohol.  Patient was followed by hematology and GI.  Patient had received Aranesp on 04/21/22, Received 1 unit of packed RBC on 04/25/2022, panel inconclusive continue to monitor.  PUD/marginal ulcer on recent EGD  - Continue PPI, Carafate, continue Diflucan, was recommended total 10 days last admission.   Chronic thrombocytopenia  -Likely due to bone marrow suppression, alcoholic liver disease, monitor no signs of active bleeding   AKI on Chronic kidney disease stage 2 - Baseline creatinine was 0.9, during previous admission, had presented with creatinine of 6.38 on 7/16, fattening has plateaued around 1.5 after some fluid removal, likely this is her baseline.  Monitor with ongoing diuresis.  UA had no proteinuria, will involve nephrology as well if renal function declines in the future.  Currently overall much improved as compared to admission renal function.   Hypokalemia and hypomagnesemia.  Replaced.    Hypotension.  Appears somewhat chronic.  Added midodrine.    Stage IV pressure ulcer of sacral region  - will consult wound care      Condition - Extremely Guarded  Family  Communication  :  None present  Code Status :  Full  Consults  :  GI  PUD Prophylaxis : PPI   Procedures  :     Abd [place] - 1. Hepatic steatosis. Mildly nodular liver contour, concerning for cirrhosis. 2. Trace perihepatic ascites.  No hydronephrosis.      Disposition Plan  :    Status is: Observation DVT Prophylaxis  :    Place and maintain sequential compression device Start: 04/25/22 1424  Lab Results  Component Value Date   PLT 97 (L) 04/27/2022    Diet :  Diet Order             Diet Heart Room service appropriate? Yes; Fluid consistency: Thin; Fluid restriction: 1500 mL Fluid  Diet effective now                    Inpatient Medications  Scheduled Meds:  ferrous sulfate  325 mg Oral Q breakfast   fluconazole  200 mg Oral Daily   folic acid  1 mg Oral Daily   furosemide  40 mg Oral Once   midodrine  10 mg Oral TID WC   pantoprazole  40 mg Oral BID AC   phytonadione  10 mg Oral Daily   prednisoLONE  40 mg Oral Daily   sodium chloride flush  3 mL Intravenous Q12H   spironolactone  25 mg Oral Daily   sucralfate  1 g Oral Q6H   Continuous Infusions:  sodium chloride     magnesium sulfate bolus IVPB     PRN Meds:.sodium chloride, loperamide, ondansetron **OR** ondansetron (ZOFRAN) IV, sodium chloride flush, traMADol  Time Spent in minutes  30   Lala Lund M.D on 04/27/2022 at 9:03 AM  To page go to [email or web address]   [facility] -  Office  [phone]  See all Orders from today for further details    Objective:   Vitals:   04/26/22 1648 04/26/22 1936 04/26/22 2347 04/27/22 0500  BP: (!) 104/54 (!) 99/50 92/60 (!) 106/47  Pulse: (!) 46 Marland Kitchen)  55 (!) 54 61  Resp: 16 18 17 18   Temp: 98.5 F (36.9 C) 97.9 F (36.6 C) 97.7 F (36.5 C) (!) 97.5 F (36.4 C)  TempSrc: Oral Oral Oral Oral  SpO2:  96% 93% 92%    Wt Readings from Last 3 Encounters:  04/22/22 100.4 kg  01/18/22 99.2 kg  09/30/21 104.5 kg    No intake or output data in  the 24 hours ending 04/27/22 0903   Physical Exam  Awake Alert, No new F.N deficits, Normal affect [place].AT,PERRAL Supple Neck, No JVD,   Symmetrical Chest wall movement, Good air movement bilaterally, CTAB RRR,No Gallops, Rubs or new Murmurs,  +ve B.Sounds, Abd Soft, No tenderness,   2+ edema - UNNA boots   Data Review:    CBC Recent Labs  Lab 04/21/22 0418 04/22/22 0112 04/25/22 0539 04/26/22 0459 04/27/22 0450  WBC 16.1* 20.8* 18.6* 13.2* 14.0*  HGB 7.0* 8.6* 7.3* 8.2* 8.1*  HCT 20.4* 25.7* 21.9* 24.4* 23.2*  PLT 47* 61* 86* 83* 97*  MCV 111.5* 104.9* 107.4* 103.0* 101.3*  MCH 38.3* 35.1* 35.8* 34.6* 35.4*  MCHC 34.3 33.5 33.3 33.6 34.9  RDW Not Measured Not Measured Not Measured Not Measured Not Measured  LYMPHSABS 1.8  --  1.6  --  1.3  MONOABS 0.9  --  1.1*  --  0.8  EOSABS 0.0  --  0.0  --  0.0  BASOSABS 0.0  --  0.0  --  0.0    Electrolytes Recent Labs  Lab 04/21/22 0418 04/22/22 0112 04/24/22 1802 04/25/22 0539 04/25/22 1627 04/26/22 0459 04/27/22 0450 04/27/22 0500  NA 137 138 137  --   --  139 137  --   K 3.6 3.6 4.1  --   --  3.2* 4.1  --   CL 106 106 105  --   --  106 104  --   CO2 24 24 20*  --   --  22 22  --   GLUCOSE 131* 128* 141*  --   --  142* 117*  --   BUN 34* 29* 24*  --   --  21* 26*  --   CREATININE 1.61* 1.58* 1.50*  --  1.44* 1.50* 1.69*  --   CALCIUM 8.6* 8.9 8.8*  --   --  8.6* 7.4*  --   AST 121* 147*  --  122*  --  103* 71*  --   ALT 116* 142*  --  154*  --  143* 124*  --   ALKPHOS 94 96  --  120  --  99 100  --   BILITOT 6.9* 9.7*  --  6.4*  --  6.5* 6.4*  --   ALBUMIN 2.1* 2.5*  --  2.4*  --  2.8* 2.9*  --   MG  --   --   --   --   --   --  1.3*  --   INR  --   --   --  1.5*  --   --  1.6*  --   BNP  --   --   --   --   --   --   --  804.0*       Radiology Reports US Abdomen Complete  Result Date: 04/26/2022 CLINICAL DATA:  Cirrhosis EXAM: ABDOMEN ULTRASOUND COMPLETE COMPARISON:  None Available. FINDINGS:  Gallbladder: Surgically absent. Common bile duct: Diameter: 4.8 mm Liver: No focal lesion identified. Slightly nodular liver  contour with increased parenchymal echogenicity. Portal vein is patent on color Doppler imaging with normal direction of blood flow towards the liver. IVC: No abnormality visualized. Pancreas: Not visualized due to overlying bowel gas. Spleen: Size and appearance within normal limits. Right Kidney: Length: 14.2. Echogenicity within normal limits. No mass or hydronephrosis visualized. Left Kidney: Length: 13.2. Echogenicity within normal limits. No mass or hydronephrosis visualized. Abdominal aorta: No aneurysm visualized. Other findings: Trace ascites seen about the liver. IMPRESSION: 1. Hepatic steatosis. Mildly nodular liver contour, concerning for cirrhosis. 2. Trace perihepatic ascites. Electronically Signed   By: Allegra Lai M.D.   On: 04/26/2022 11:35

## 2022-04-27 NOTE — Progress Notes (Signed)
Gastroenterology Inpatient Follow-up Note   PATIENT IDENTIFICATION  [name] is a 46 y.o. female Hospital Day: 4  SUBJECTIVE  Patient's labs chart reviewed. No significant change from yesterday into today. She is being diuresed. She is wondering when she will be able to be discharged. No clear evidence of GI bleeding at this time.   OBJECTIVE  Scheduled Inpatient Medications:   ferrous sulfate  325 mg Oral Q breakfast   fluconazole  200 mg Oral Daily   folic acid  1 mg Oral Daily   midodrine  10 mg Oral TID WC   pantoprazole  40 mg Oral BID AC   phytonadione  10 mg Oral Daily   prednisoLONE  40 mg Oral Daily   sodium chloride flush  3 mL Intravenous Q12H   spironolactone  25 mg Oral Daily   sucralfate  1 g Oral Q6H   Continuous Inpatient Infusions:   sodium chloride     PRN Inpatient Medications: sodium chloride, loperamide, ondansetron **OR** ondansetron (ZOFRAN) IV, sodium chloride flush, traMADol   Physical Examination  Temp:  [97.5 F (36.4 C)-97.8 F (36.6 C)] 97.8 F (36.6 C) (08/03 1958) Pulse Rate:  [51-67] 51 (08/03 1958) Resp:  [16-18] 17 (08/03 1958) BP: (92-118)/(47-68) 113/58 (08/03 1958) SpO2:  [92 %-98 %] 95 % (08/03 1958) Temp (24hrs), Avg:97.7 F (36.5 C), Min:97.5 F (36.4 C), Max:97.8 F (36.6 C)    GEN: No acute distress PSYCH: Cooperative EYE: Sclerae icteric ENT: MMM CV: Nontachycardic RESP: No audible wheezing GI: NABS, protuberant abdomen, TTP in MEG  MSK/EXT: Bilateral lower extremity edema present SKIN: Difficult to discern jaundice NEURO:  Alert & Oriented x 3, no focal deficits, no evidence of asterixis   Review of Data   Laboratory Studies   Recent Labs  Lab 04/27/22 0450  NA 137  K 4.1  CL 104  CO2 22  BUN 26*  CREATININE 1.69*  GLUCOSE 117*  CALCIUM 7.4*  MG 1.3*   Recent Labs  Lab 04/27/22 0450  AST 71*  ALT 124*  ALKPHOS 100    Recent Labs  Lab 04/25/22 0539 04/26/22 0459 04/27/22 0450   WBC 18.6* 13.2* 14.0*  HGB 7.3* 8.2* 8.1*  HCT 21.9* 24.4* 23.2*  PLT 86* 83* 97*   Recent Labs  Lab 04/25/22 0539 04/27/22 0450  INR 1.5* 1.6*   MELD 3.0: 27 at 04/27/2022  4:50 AM MELD-Na: 24 at 04/27/2022  4:50 AM Calculated from: Serum Creatinine: 1.69 mg/dL at 09/26/4578  9:98 AM Serum Sodium: 137 mmol/L at 04/27/2022  4:50 AM Total Bilirubin: 6.4 mg/dL at 11/25/8248  5:39 AM Serum Albumin: 2.9 g/dL at 03/30/7340  9:37 AM INR(ratio): 1.6 at 04/27/2022  4:50 AM Age at listing (hypothetical): 45 years Sex: Female at 04/27/2022  4:50 AM   Imaging Studies  US Abdomen Complete  Result Date: 04/26/2022 CLINICAL DATA:  Cirrhosis EXAM: ABDOMEN ULTRASOUND COMPLETE COMPARISON:  None Available. FINDINGS: Gallbladder: Surgically absent. Common bile duct: Diameter: 4.8 mm Liver: No focal lesion identified. Slightly nodular liver contour with increased parenchymal echogenicity. Portal vein is patent on color Doppler imaging with normal direction of blood flow towards the liver. IVC: No abnormality visualized. Pancreas: Not visualized due to overlying bowel gas. Spleen: Size and appearance within normal limits. Right Kidney: Length: 14.2. Echogenicity within normal limits. No mass or hydronephrosis visualized. Left Kidney: Length: 13.2. Echogenicity within normal limits. No mass or hydronephrosis visualized. Abdominal aorta: No aneurysm visualized. Other findings: Trace ascites seen about the  liver. IMPRESSION: 1. Hepatic steatosis. Mildly nodular liver contour, concerning for cirrhosis. 2. Trace perihepatic ascites. Electronically Signed   By: Allegra Lai M.D.   On: 04/26/2022 11:35    GI Procedures and Studies  No new GI procedures to review   ASSESSMENT  [name] is a 46 y.o. female with a pmh significant for hypertension, prior bariatric surgery (complicated by marginal ulceration), alcohol dependence with previous episodes of alcoholic hepatitis, persisting iron deficiency.  Overall liver tests  are stable.  She is being diuresed by the medicine service.  No clear evidence of GI bleeding at this time.  No plan for repeat endoscopic evaluation unless overt bleeding (melena and transfusion dependent anemia develop).  Would likely restart with upper before doing colonoscopy.  She should continue 40 mg of Protonix twice daily moving forward until follow-up with her primary GI team at [facility].  If no contraindication from nephrology standpoint, then could continue Carafate twice daily until follow-up with [facility] GI team.  From a liver standpoint, she has already been maintained with prednisolone, so she should continue that for a full 30-day plan as previously outlined.  She will be followed at [facility] and not by the [place] GI team in the outpatient setting.  Okay to continue her fluconazole treatment for completion of therapy for esophageal candidiasis.  Overt cirrhosis is not clear as there is some discordance in her spleen size being normal though she does have thrombocytopenia and an elevated INR.  Unfortunately she does not meet criteria for consideration of liver transplantation based on her recent alcohol consumption but further things can be discussed with the [facility] in the outpatient setting.  If she continues to have a persisting elevation in her bilirubin, then further consideration of persisting/chronic liver disease will be have to be had and whether there could be any role for liver biopsy to exclude other things can be determined by the [facility] liver clinic.  At this point the medicine service can work on titrating diuretics as per their comfort ability.  It is not clear that this patient currently has hepatorenal syndrome based on the current creatinine.  The inpatient GI service will sign off at this time call back if other questions develop.   PLAN/RECOMMENDATIONS  Volume -If able to tolerate addition of Lasix 20 mg may want to consider that  in addition to spironolactone -Obtain daily standing weight while in-house -1500 mg Na diet Infection -No evidence of significant ascites to be concern for SBP Bleeding -No varices noted on last endoscopy Encephalopathy -No issues of encephalopathy at this time Screening -Although not clearly cirrhotic/also not clearly noncirrhotic, HCC screening would be up-to-date at this time based on imaging Transplant -Not a transplant candidate due to her recent alcohol consumption within the last month -Can discuss things further with [facility] -Continue 10 mg oral vitamin K daily x5 days total -Promote intake of 1.5 g/kg/day of Protein (Ensures/Boosts at each meal) -Continue prednisolone for total 30-day treatment   Inpatient GI will sign off at this time but please page/call with questions or concerns.   Corliss Parish, MD [facility] Office # [phone]    LOS: 1 day  Lemar Lofty  04/27/2022, 10:18 PM

## 2022-04-28 ENCOUNTER — Inpatient Hospital Stay (HOSPITAL_COMMUNITY): Payer: Self-pay

## 2022-04-28 LAB — COMPREHENSIVE METABOLIC PANEL
ALT: 118 U/L — ABNORMAL HIGH (ref 0–44)
AST: 70 U/L — ABNORMAL HIGH (ref 15–41)
Albumin: 2.7 g/dL — ABNORMAL LOW (ref 3.5–5.0)
Alkaline Phosphatase: 128 U/L — ABNORMAL HIGH (ref 38–126)
Anion gap: 10 (ref 5–15)
BUN: 22 mg/dL — ABNORMAL HIGH (ref 6–20)
CO2: 23 mmol/L (ref 22–32)
Calcium: 8.9 mg/dL (ref 8.9–10.3)
Chloride: 102 mmol/L (ref 98–111)
Creatinine, Ser: 1.69 mg/dL — ABNORMAL HIGH (ref 0.44–1.00)
GFR, Estimated: 38 mL/min — ABNORMAL LOW (ref >60)
Glucose, Bld: 130 mg/dL — ABNORMAL HIGH (ref 70–99)
Potassium: 3.3 mmol/L — ABNORMAL LOW (ref 3.5–5.1)
Sodium: 135 mmol/L (ref 135–145)
Total Bilirubin: 5.1 mg/dL — ABNORMAL HIGH (ref 0.3–1.2)
Total Protein: 6 g/dL — ABNORMAL LOW (ref 6.5–8.1)

## 2022-04-28 LAB — CBC WITH DIFFERENTIAL/PLATELET
Abs Immature Granulocytes: 0.07 10*3/uL (ref 0.00–0.07)
Basophils Absolute: 0 10*3/uL (ref 0.0–0.1)
Basophils Relative: 0 %
Eosinophils Absolute: 0 10*3/uL (ref 0.0–0.5)
Eosinophils Relative: 0 %
HCT: 23.4 % — ABNORMAL LOW (ref 36.0–46.0)
Hemoglobin: 8.1 g/dL — ABNORMAL LOW (ref 12.0–15.0)
Immature Granulocytes: 1 %
Lymphocytes Relative: 7 %
Lymphs Abs: 1 10*3/uL (ref 0.7–4.0)
MCH: 35.1 pg — ABNORMAL HIGH (ref 26.0–34.0)
MCHC: 34.6 g/dL (ref 30.0–36.0)
MCV: 101.3 fL — ABNORMAL HIGH (ref 80.0–100.0)
Monocytes Absolute: 0.7 10*3/uL (ref 0.1–1.0)
Monocytes Relative: 5 %
Neutro Abs: 12.2 10*3/uL — ABNORMAL HIGH (ref 1.7–7.7)
Neutrophils Relative %: 87 %
Platelets: 111 10*3/uL — ABNORMAL LOW (ref 150–400)
RBC: 2.31 MIL/uL — ABNORMAL LOW (ref 3.87–5.11)
WBC: 13.9 10*3/uL — ABNORMAL HIGH (ref 4.0–10.5)
nRBC: 0.1 % (ref 0.0–0.2)

## 2022-04-28 LAB — PROTIME-INR
INR: 1.4 — ABNORMAL HIGH (ref 0.8–1.2)
Prothrombin Time: 17.4 seconds — ABNORMAL HIGH (ref 11.4–15.2)

## 2022-04-28 LAB — BRAIN NATRIURETIC PEPTIDE: B Natriuretic Peptide: 818.1 pg/mL — ABNORMAL HIGH (ref 0.0–100.0)

## 2022-04-28 LAB — MAGNESIUM: Magnesium: 2 mg/dL (ref 1.7–2.4)

## 2022-04-28 MED ORDER — POTASSIUM CHLORIDE CRYS ER 20 MEQ PO TBCR
40.0000 meq | EXTENDED_RELEASE_TABLET | Freq: Once | ORAL | Status: AC
  Administered 2022-04-28: 40 meq via ORAL
  Filled 2022-04-28: qty 2

## 2022-04-28 MED ORDER — FUROSEMIDE 40 MG PO TABS
60.0000 mg | ORAL_TABLET | Freq: Once | ORAL | Status: AC
  Administered 2022-04-28: 60 mg via ORAL
  Filled 2022-04-28: qty 1

## 2022-04-28 NOTE — Progress Notes (Signed)
Physical Therapy Treatment Patient Details Name: [name] MRN: [id number] DOB: 06/07/1976 Today's Date: 04/28/2022   History of Present Illness 46 yo female presents to [facility] on 7/31 with LE edema, tarry stools. Recept admission 7/16-7/29 for hypovolemic shock due to gastroenteritis due to norovirus, alcoholic hepatitis, sacral ulcer. PMH includes bariatric surgery, HTN, alcoholic hepatitis, chronic anemia, PUD.    PT Comments    Pt with much improved balance and activity tolerance today, practiced steps with proficiency but does require cues to slow down. Pt reporting L ankle and foot pain, but does not impact gait. Pt doing great, no further acute PT needs and pt mobilizing independently per report.      Recommendations for follow up therapy are one component of a multi-disciplinary discharge planning process, led by the attending physician.  Recommendations may be updated based on patient status, additional functional criteria and insurance authorization.  Follow Up Recommendations  No PT follow up     Assistance Recommended at Discharge PRN  Patient can return home with the following     Equipment Recommendations  None recommended by PT    Recommendations for Other Services       Precautions / Restrictions Precautions Precautions: None Restrictions Weight Bearing Restrictions: No     Mobility  Bed Mobility Overal bed mobility: Modified Independent                  Transfers Overall transfer level: Modified independent     Sit to Stand: Modified independent (Device/Increase time)                Ambulation/Gait Ambulation/Gait assistance: Modified independent (Device/Increase time) Gait Distance (Feet): 300 Feet Assistive device: None Gait Pattern/deviations: Step-through pattern, WFL(Within Functional Limits) Gait velocity: wfl     General Gait Details: improved gait trajectory in straight path and no LOB this session, even when challenges  given   Stairs Stairs: Yes Stairs assistance: Modified independent (Device/Increase time) Stair Management: Two rails, Step to pattern, Forwards Number of Stairs: 10 General stair comments: cues for step-to pattern   Wheelchair Mobility    Modified Rankin (Stroke Patients Only)       Balance Overall balance assessment: Needs assistance Sitting-balance support: No upper extremity supported, Feet supported Sitting balance-Leahy Scale: Good     Standing balance support: No upper extremity supported, During functional activity Standing balance-Leahy Scale: Fair                 High Level Balance Comments: WFL: step over object, 180 deg turn and stop, weaving. Min unsteadiness but pt-corrected: horiz and vert head turns            Cognition Arousal/Alertness: Awake/alert Behavior During Therapy: WFL for tasks assessed/performed Overall Cognitive Status: Within Functional Limits for tasks assessed                                          Exercises      General Comments General comments (skin integrity, edema, etc.): distal LE edema L>R, gentle lymphatic massage performed to level of unna boot      Pertinent Vitals/Pain Pain Assessment Pain Assessment: Faces Faces Pain Scale: Hurts a little bit Pain Location: L ankle Pain Descriptors / Indicators: Sore, Discomfort Pain Intervention(s): Limited activity within patient's tolerance, Monitored during session, Repositioned    Home Living  Prior Function            PT Goals (current goals can now be found in the care plan section) Acute Rehab PT Goals Patient Stated Goal: home PT Goal Formulation: With patient Time For Goal Achievement: 05/10/22 Potential to Achieve Goals: Good Progress towards PT goals: Progressing toward goals    Frequency    Min 3X/week      PT Plan Current plan remains appropriate    Co-evaluation               AM-PAC PT "6 Clicks" Mobility   Outcome Measure  Help needed turning from your back to your side while in a flat bed without using bedrails?: None Help needed moving from lying on your back to sitting on the side of a flat bed without using bedrails?: None Help needed moving to and from a bed to a chair (including a wheelchair)?: None Help needed standing up from a chair using your arms (e.g., wheelchair or bedside chair)?: None Help needed to walk in hospital room?: None Help needed climbing 3-5 steps with a railing? : None 6 Click Score: 24    End of Session   Activity Tolerance: Patient tolerated treatment well Patient left: in bed;with call bell/phone within reach Nurse Communication: Mobility status PT Visit Diagnosis: Other abnormalities of gait and mobility (R26.89);Muscle weakness (generalized) (M62.81)     Time: 1601-0932 PT Time Calculation (min) (ACUTE ONLY): 13 min  Charges:  $Therapeutic Activity: 8-22 mins                     Marye Round, PT DPT Acute Rehabilitation Services Pager [phone]  Office [phone]    Izzabell Klasen E Christain [place] 04/28/2022, 11:52 AM

## 2022-04-28 NOTE — Consult Note (Signed)
Reason for Consult: Renal failure Referring Physician:  Dr. 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 She was seen by [facility] prior admission with AKI thought to be secondary to HRS and hypotension. At time of d/c 7/29 her weight was 100.4kg. - Likely has a new baseline now ~1.4-1.6 with 1.4 possibly being diluted with massive volume overload at time of admission.  - Avoid nephrotoxic medications including NSAIDs and iodinated contrast, unless absolutely necessary.  Preferred narcotic agents for pain control are hydromorphone, fentanyl, and methadone. Morphine should not be used. Avoid Baclofen and avoid oral sodium phosphate and magnesium citrate based laxatives / bowel preps.   - Continue strict Input and Output monitoring. Will - Monitor the patient closely with you and intervene or adjust therapy as indicated by changes in clinical status/labs. Cont diuresis as you are doing; still markedly overloaded.   Alcoholic cirrhosis with portal hypertension being diuresed with marked fluid overload. Anemia of chronic disease s/p transfusion and EGD prior admission. Stage IV sacral decubitus ulcer  HPI: [name] is an 46 y.o. female HTN, alcoholic hepatitis, chronic anemia, PUD recently admitted with gastroenteritis with a complicated course including aspiration PNA, stage IV sacral decubitus ulcer, endosscopy with plaques possibly from candidiasis. She presents this admission with worsening lower extremity swelling and fatigue. Of note the Cr was 1.6 at time of last discharge on 7/28.   ROS Pertinent items are noted in HPI.  Chemistry and CBC: Creatinine  Date/Time Value Ref Range Status  07/06/2021 03:27 PM 0.74 0.44 - 1.00 mg/dL Final    Comment:    Performed at [facility], [address]  [place], [place] [address]   Creatinine, Ser  Date/Time Value Ref Range Status  04/28/2022 04:13 AM 1.69 (H) 0.44 - 1.00 mg/dL Final  38/18/2993 71:69 AM 1.69 (H) 0.44 - 1.00 mg/dL Final  67/89/3810 17:51 AM 1.50 (H) 0.44 - 1.00 mg/dL Final  02/58/5277 82:42 PM 1.44 (H) 0.44 - 1.00 mg/dL Final  35/36/1443 15:40 PM 1.50 (H) 0.44 - 1.00 mg/dL Final  08/67/6195 09:32 AM 1.58 (H) 0.44 - 1.00 mg/dL Final  67/08/4579 99:83 AM 1.61 (H) 0.44 - 1.00 mg/dL Final  38/25/0539 76:73 AM 1.54 (H) 0.44 - 1.00 mg/dL Final  41/93/7902 40:97 AM 1.69 (H) 0.44 - 1.00 mg/dL Final  35/32/9924 26:83 AM 1.82 (H) 0.44 - 1.00 mg/dL Final  41/96/2229 79:89 AM 1.97 (H) 0.44 - 1.00 mg/dL Final  21/19/4174 08:14 AM 2.10 (H) 0.44 - 1.00 mg/dL Final  48/18/5631 49:70 AM 2.25 (H) 0.44 - 1.00 mg/dL Final  26/37/8588 50:27 AM 2.52 (H) 0.44 - 1.00 mg/dL Final  74/08/8785 76:72 PM 2.46 (H) 0.44 - 1.00 mg/dL Final  09/47/0962 83:66 AM 2.80 (H) 0.44 - 1.00 mg/dL Final  29/47/6546 50:35 AM 2.99 (H) 0.44 - 1.00 mg/dL Final  46/56/8127 51:70 PM 3.09 (H) 0.44 - 1.00 mg/dL Final    Comment:    ICTERUS AT THIS LEVEL MAY AFFECT RESULT  04/12/2022 10:15 AM 3.38 (H) 0.44 - 1.00 mg/dL Final    Comment:    DELTA CHECK NOTED  04/11/2022 02:05 AM 5.72 (H) 0.44 - 1.00 mg/dL Final  01/74/9449 67:59 AM 6.54 (H) 0.44 - 1.00 mg/dL Final  16/38/4665 99:35 PM 6.38 (H) 0.44 - 1.00 mg/dL Final  70/17/7939 03:00 AM 0.90 0.44 -  1.00 mg/dL Final  11/23/2019 03:53 AM 1.47 (H) 0.44 - 1.00 mg/dL Final    Comment:    DELTA CHECK NOTED  11/22/2019 12:49 PM 0.68 0.44 - 1.00 mg/dL Final  08/04/2018 11:58 PM 0.76 0.44 - 1.00 mg/dL Final  01/09/2013 09:10 PM 0.54 0.50 - 1.10 mg/dL Final  12/03/2011 12:05 AM 0.67 0.50 - 1.10 mg/dL Final  02/10/2011 06:56 PM 0.56 0.4 - 1.2 mg/dL Final  10/20/2010 04:21 AM 0.56 0.4 - 1.2 mg/dL Final  10/19/2010 04:26 AM 0.59 0.4 - 1.2 mg/dL Final  09/13/2010 03:48 PM 0.7 0.4 - 1.2 mg/dL Final  09/04/2010 09:00 PM 0.72 0.4  - 1.2 mg/dL Final  09/03/2010 08:30 PM 0.6 0.4 - 1.2 mg/dL Final  10/06/2009 11:20 PM 0.9 0.4 - 1.2 mg/dL Final   Recent Labs  Lab 04/22/22 0112 04/24/22 1802 04/25/22 1627 04/26/22 0459 04/27/22 0450 04/28/22 0413  NA 138 137  --  139 137 135  K 3.6 4.1  --  3.2* 4.1 3.3*  CL 106 105  --  106 104 102  CO2 24 20*  --  22 22 23   GLUCOSE 128* 141*  --  142* 117* 130*  BUN 29* 24*  --  21* 26* 22*  CREATININE 1.58* 1.50* 1.44* 1.50* 1.69* 1.69*  CALCIUM 8.9 8.8*  --  8.6* 7.4* 8.9   Recent Labs  Lab 04/25/22 0539 04/26/22 0459 04/27/22 0450 04/28/22 0413  WBC 18.6* 13.2* 14.0* 13.9*  NEUTROABS 15.7*  --  11.9* 12.2*  HGB 7.3* 8.2* 8.1* 8.1*  HCT 21.9* 24.4* 23.2* 23.4*  MCV 107.4* 103.0* 101.3* 101.3*  PLT 86* 83* 97* 111*   Liver Function Tests: Recent Labs  Lab 04/26/22 0459 04/27/22 0450 04/28/22 0413  AST 103* 71* 70*  ALT 143* 124* 118*  ALKPHOS 99 100 128*  BILITOT 6.5* 6.4* 5.1*  PROT 6.2* 6.0* 6.0*  ALBUMIN 2.8* 2.9* 2.7*   No results for input(s): "LIPASE", "AMYLASE" in the last 168 hours. No results for input(s): "AMMONIA" in the last 168 hours. Cardiac Enzymes: No results for input(s): "CKTOTAL", "CKMB", "CKMBINDEX", "TROPONINI" in the last 168 hours. Iron Studies:  Recent Labs    04/26/22 1013  IRON 52  TIBC 153*  FERRITIN 477*   PT/INR: @LABRCNTIP (inr:5)  Xrays/Other Studies: ) Results for orders placed or performed during the hospital encounter of 04/24/22 (from the past 48 hour(s))  Vitamin B12     Status: Abnormal   Collection Time: 04/26/22 10:13 AM  Result Value Ref Range   Vitamin B-12 3,376 (H) 180 - 914 pg/mL    Comment: (NOTE) This assay is not validated for testing neonatal or myeloproliferative syndrome specimens for Vitamin B12 levels. Performed at [facility], [place] [address] [place], [place] [address]   Folate     Status: None   Collection Time: 04/26/22 10:13 AM  Result Value Ref Range   Folate >40.0 >5.9  ng/mL    Comment: Performed at [facility] [address], [place], [place] [address]  Iron and TIBC     Status: Abnormal   Collection Time: 04/26/22 10:13 AM  Result Value Ref Range   Iron 52 28 - 170 ug/dL   TIBC 153 (L) 250 - 450 ug/dL   Saturation Ratios 34 (H) 10.4 - 31.8 %   UIBC 101 ug/dL    Comment: Performed at [facility], [place] [address], [place], [place] [address]  Ferritin     Status: Abnormal  Collection Time: 04/26/22 10:13 AM  Result Value Ref Range   Ferritin 477 (H) 11 - 307 ng/mL    Comment: ICTERUS AT THIS LEVEL MAY AFFECT RESULT Performed at [facility], [place] [address], [place], [place] [address]   Reticulocytes     Status: Abnormal   Collection Time: 04/26/22 10:13 AM  Result Value Ref Range   Retic Ct Pct 5.1 (H) 0.4 - 3.1 %   RBC. 2.65 (L) 3.87 - 5.11 MIL/uL   Retic Count, Absolute 134.1 19.0 - 186.0 K/uL   Immature Retic Fract 23.1 (H) 2.3 - 15.9 %    Comment: Performed at [facility] [address], [place], [place] [address]  Bilirubin, direct     Status: Abnormal   Collection Time: 04/26/22 10:13 AM  Result Value Ref Range   Bilirubin, Direct 3.5 (H) 0.0 - 0.2 mg/dL    Comment: Performed at [facility] [address] [place], [place] [address]  Magnesium     Status: Abnormal   Collection Time: 04/27/22  4:50 AM  Result Value Ref Range   Magnesium 1.3 (L) 1.7 - 2.4 mg/dL    Comment: Performed at [facility] [address], [place], [place] [address]  Comprehensive metabolic panel     Status: Abnormal   Collection Time: 04/27/22  4:50 AM  Result Value Ref Range   Sodium 137 135 - 145 mmol/L   Potassium 4.1 3.5 - 5.1 mmol/L    Comment: DELTA CHECK NOTED   Chloride 104 98 - 111 mmol/L   CO2 22 22 - 32 mmol/L   Glucose, Bld 117 (H) 70 - 99 mg/dL    Comment: Glucose reference range applies only to samples taken after fasting for at least 8 hours.   BUN 26 (H) 6 - 20 mg/dL   Creatinine, Ser 1.69 (H) 0.44  - 1.00 mg/dL   Calcium 7.4 (L) 8.9 - 10.3 mg/dL   Total Protein 6.0 (L) 6.5 - 8.1 g/dL   Albumin 2.9 (L) 3.5 - 5.0 g/dL   AST 71 (H) 15 - 41 U/L   ALT 124 (H) 0 - 44 U/L   Alkaline Phosphatase 100 38 - 126 U/L   Total Bilirubin 6.4 (H) 0.3 - 1.2 mg/dL   GFR, Estimated 38 (L) >60 mL/min    Comment: (NOTE) Calculated using the CKD-EPI Creatinine Equation (2021)    Anion gap 11 5 - 15    Comment: Performed at [facility], [place] [address] [place], [place] [address]  CBC with Differential/Platelet     Status: Abnormal   Collection Time: 04/27/22  4:50 AM  Result Value Ref Range   WBC 14.0 (H) 4.0 - 10.5 K/uL   RBC 2.29 (L) 3.87 - 5.11 MIL/uL   Hemoglobin 8.1 (L) 12.0 - 15.0 g/dL   HCT 23.2 (L) 36.0 - 46.0 %   MCV 101.3 (H) 80.0 - 100.0 fL   MCH 35.4 (H) 26.0 - 34.0 pg   MCHC 34.9 30.0 - 36.0 g/dL   RDW Not Measured 11.5 - 15.5 %   Platelets 97 (L) 150 - 400 K/uL    Comment: Immature Platelet Fraction may be clinically indicated, consider ordering this additional test [id number]    nRBC 0.3 (H) 0.0 - 0.2 %   Neutrophils Relative % 84 %   Neutro Abs 11.9 (H) 1.7 - 7.7 K/uL   Lymphocytes Relative 9 %   Lymphs Abs 1.3 0.7 - 4.0  K/uL   Monocytes Relative 6 %   Monocytes Absolute 0.8 0.1 - 1.0 K/uL   Eosinophils Relative 0 %   Eosinophils Absolute 0.0 0.0 - 0.5 K/uL   Basophils Relative 0 %   Basophils Absolute 0.0 0.0 - 0.1 K/uL   WBC Morphology MORPHOLOGY UNREMARKABLE    RBC Morphology MORPHOLOGY UNREMARKABLE    Smear Review MORPHOLOGY UNREMARKABLE    Immature Granulocytes 1 %   Abs Immature Granulocytes 0.08 (H) 0.00 - 0.07 K/uL    Comment: Performed at [facility] [address] [place], [place] [address]  Protime-INR     Status: Abnormal   Collection Time: 04/27/22  4:50 AM  Result Value Ref Range   Prothrombin Time 19.1 (H) 11.4 - 15.2 seconds   INR 1.6 (H) 0.8 - 1.2    Comment: (NOTE) INR goal varies based on device and disease states. Performed at  [facility], [place] [address] [place], [place] [address]   Brain natriuretic peptide     Status: Abnormal   Collection Time: 04/27/22  5:00 AM  Result Value Ref Range   B Natriuretic Peptide 804.0 (H) 0.0 - 100.0 pg/mL    Comment: Performed at [facility] [address], [place], [place] [address]  Magnesium     Status: None   Collection Time: 04/28/22  4:13 AM  Result Value Ref Range   Magnesium 2.0 1.7 - 2.4 mg/dL    Comment: Performed at [facility] [address] [place], [place] [address]  Comprehensive metabolic panel     Status: Abnormal   Collection Time: 04/28/22  4:13 AM  Result Value Ref Range   Sodium 135 135 - 145 mmol/L   Potassium 3.3 (L) 3.5 - 5.1 mmol/L   Chloride 102 98 - 111 mmol/L   CO2 23 22 - 32 mmol/L   Glucose, Bld 130 (H) 70 - 99 mg/dL    Comment: Glucose reference range applies only to samples taken after fasting for at least 8 hours.   BUN 22 (H) 6 - 20 mg/dL   Creatinine, Ser 1.69 (H) 0.44 - 1.00 mg/dL   Calcium 8.9 8.9 - 10.3 mg/dL   Total Protein 6.0 (L) 6.5 - 8.1 g/dL   Albumin 2.7 (L) 3.5 - 5.0 g/dL   AST 70 (H) 15 - 41 U/L   ALT 118 (H) 0 - 44 U/L   Alkaline Phosphatase 128 (H) 38 - 126 U/L   Total Bilirubin 5.1 (H) 0.3 - 1.2 mg/dL   GFR, Estimated 38 (L) >60 mL/min    Comment: (NOTE) Calculated using the CKD-EPI Creatinine Equation (2021)    Anion gap 10 5 - 15    Comment: Performed at [facility], [place] [address], [place],  [address]  CBC with Differential/Platelet     Status: Abnormal   Collection Time: 04/28/22  4:13 AM  Result Value Ref Range   WBC 13.9 (H) 4.0 - 10.5 K/uL   RBC 2.31 (L) 3.87 - 5.11 MIL/uL   Hemoglobin 8.1 (L) 12.0 - 15.0 g/dL   HCT 23.4 (L) 36.0 - 46.0 %   MCV 101.3 (H) 80.0 - 100.0 fL   MCH 35.1 (H) 26.0 - 34.0 pg   MCHC 34.6 30.0 - 36.0 g/dL   RDW Not Measured 11.5 - 15.5 %   Platelets 111 (L) 150 - 400 K/uL    Comment: Immature Platelet Fraction may be clinically indicated,  consider ordering this additional  test [id number] REPEATED TO VERIFY    nRBC 0.1 0.0 - 0.2 %   Neutrophils Relative % 87 %   Neutro Abs 12.2 (H) 1.7 - 7.7 K/uL   Lymphocytes Relative 7 %   Lymphs Abs 1.0 0.7 - 4.0 K/uL   Monocytes Relative 5 %   Monocytes Absolute 0.7 0.1 - 1.0 K/uL   Eosinophils Relative 0 %   Eosinophils Absolute 0.0 0.0 - 0.5 K/uL   Basophils Relative 0 %   Basophils Absolute 0.0 0.0 - 0.1 K/uL   Immature Granulocytes 1 %   Abs Immature Granulocytes 0.07 0.00 - 0.07 K/uL    Comment: Performed at [facility], [address] [place], [place] [address]  Brain natriuretic peptide     Status: Abnormal   Collection Time: 04/28/22  4:13 AM  Result Value Ref Range   B Natriuretic Peptide 818.1 (H) 0.0 - 100.0 pg/mL    Comment: Performed at [facility], [address], [place], [place] [address]  Protime-INR     Status: Abnormal   Collection Time: 04/28/22  4:13 AM  Result Value Ref Range   Prothrombin Time 17.4 (H) 11.4 - 15.2 seconds   INR 1.4 (H) 0.8 - 1.2    Comment: (NOTE) INR goal varies based on device and disease states. Performed at [facility], [address], [place], [place] [address]    DG Ankle Complete Left  Result Date: 04/28/2022 CLINICAL DATA:  Increased swelling and pain today. EXAM: LEFT ANKLE COMPLETE - 3+ VIEW COMPARISON:  None Available. FINDINGS: There is overlying material that may represent casting material versus a compression stocking. The ankle mortise is symmetric and intact. Mild distal anterior tibial plafond and mild adjacent dorsal talar head-neck junction degenerative osteophytosis on lateral view. Small plantar calcaneal heel spur. Minimal distal medial malleolar degenerative osteophytosis. No acute fracture or dislocation. IMPRESSION: Mild anterior tibiotalar osteoarthritis. Electronically Signed   By: Neita Garnet M.D.   On: 04/28/2022 08:53   US Abdomen Complete  Result Date: 04/26/2022 CLINICAL DATA:   Cirrhosis EXAM: ABDOMEN ULTRASOUND COMPLETE COMPARISON:  None Available. FINDINGS: Gallbladder: Surgically absent. Common bile duct: Diameter: 4.8 mm Liver: No focal lesion identified. Slightly nodular liver contour with increased parenchymal echogenicity. Portal vein is patent on color Doppler imaging with normal direction of blood flow towards the liver. IVC: No abnormality visualized. Pancreas: Not visualized due to overlying bowel gas. Spleen: Size and appearance within normal limits. Right Kidney: Length: 14.2. Echogenicity within normal limits. No mass or hydronephrosis visualized. Left Kidney: Length: 13.2. Echogenicity within normal limits. No mass or hydronephrosis visualized. Abdominal aorta: No aneurysm visualized. Other findings: Trace ascites seen about the liver. IMPRESSION: 1. Hepatic steatosis. Mildly nodular liver contour, concerning for cirrhosis. 2. Trace perihepatic ascites. Electronically Signed   By: Allegra Lai M.D.   On: 04/26/2022 11:35    PMH:   Past Medical History:  Diagnosis Date   Acid reflux    Anemia    Ulcer     PSH:   Past Surgical History:  Procedure Laterality Date   ABDOMINAL SURGERY     abdominoplasty   BIOPSY  04/18/2022   Procedure: BIOPSY;  Surgeon: Lynann Bologna, MD;  [facility] ENDOSCOPY;  Service: Gastroenterology;;   BREAST BIOPSY Right 2013   CHOLECYSTECTOMY N/A 11/23/2019   Procedure: LAPAROSCOPIC CHOLECYSTECTOMY WITH INTRAOPERATIVE CHOLANGIOGRAM;  Surgeon: Rodman Pickle, MD;  Location: [facility] OR;  Service: General;  Laterality: N/A;   COSMETIC SURGERY  tummy tuck   ESOPHAGOGASTRODUODENOSCOPY (EGD) WITH PROPOFOL N/A 04/18/2022   Procedure: ESOPHAGOGASTRODUODENOSCOPY (EGD) WITH PROPOFOL;  Surgeon: Lynann Bologna, MD;  Location[facility] ENDOSCOPY;  Service: Gastroenterology;  Laterality: N/A;   HERNIA REPAIR     ROUX-EN-Y PROCEDURE     TUMOR REMOVAL     WRIST GANGLION EXCISION      Allergies:  Allergies  Allergen Reactions   Tape  Rash    Clear tape    Medications:   Prior to Admission medications   Medication Sig Start Date End Date Taking? Authorizing Provider  busPIRone (BUSPAR) 15 MG tablet Take 15 mg by mouth 3 (three) times daily as needed (anxiety). 06/09/21  Yes [provider]  Cyanocobalamin (B-12 PO) Take 1 tablet by mouth daily.   Yes [provider]  ferrous sulfate 325 (65 FE) MG tablet Take 1 tablet (325 mg total) by mouth daily with breakfast. 04/23/22  Yes Danford, Earl Lites, MD  fluconazole (DIFLUCAN) 200 MG tablet Take 1 tablet (200 mg total) by mouth daily. 04/22/22  Yes Danford, Earl Lites, MD  folic acid (FOLVITE) 1 MG tablet Take 1 tablet (1 mg total) by mouth daily. 04/22/22  Yes Danford, Earl Lites, MD  loperamide (IMODIUM) 2 MG capsule Take 1 capsule (2 mg total) by mouth as needed for diarrhea or loose stools. 04/22/22  Yes Danford, Earl Lites, MD  pantoprazole (PROTONIX) 40 MG tablet Take 1 tablet (40 mg total) by mouth 2 (two) times daily before a meal. 04/22/22  Yes Danford, Earl Lites, MD  prednisoLONE 5 MG TABS tablet Take 8 tablets (40 mg total) by mouth daily for 19 days. 04/22/22 05/11/22 Yes Danford, Earl Lites, MD  sucralfate (CARAFATE) 1 GM/10ML suspension Take 10 mLs (1 g total) by mouth every 6 (six) hours. 04/22/22  Yes Danford, Earl Lites, MD  prednisoLONE 5 MG TABS tablet Take 8 tablets (40 mg total) by mouth daily for 19 days. Patient not taking: Reported on 04/25/2022 04/23/22 05/12/22  Alberteen Sam, MD    Discontinued Meds:   Medications Discontinued During This Encounter  Medication Reason   sertraline (ZOLOFT) 100 MG tablet Patient Preference   leptospermum manuka honey (MEDIHONEY) paste 1 Application    furosemide (LASIX) injection 40 mg    HYDROmorphone (DILAUDID) injection 0.5-1 mg    furosemide (LASIX) injection 40 mg     Social History:  reports that she has been smoking cigarettes. She has a 6.25 pack-year smoking history.  She has never used smokeless tobacco. She reports that she does not currently use alcohol after a past usage of about 12.0 standard drinks of alcohol per week. She reports that she does not use drugs.  Family History:   Family History  Problem Relation Age of Onset   Cancer Mother        kidney   Cancer Paternal Aunt    Breast cancer Maternal Aunt    Pancreatic cancer Maternal Aunt    Lung cancer Paternal Uncle     Blood pressure 114/70, pulse (!) 47, temperature (!) 97.5 F (36.4 C), temperature source Oral, resp. rate 17, last menstrual period 03/10/2022, SpO2 99 %. General: NAD Heart: Regular rate, rhythm. No murmurs. Lungs: clear b/l, no rales noted b/l. Abdomen: Soft, non-tender, non-distended. Extremities: 1+  pedal edema but with UNNA boots. Musculoskeletal: no signs of joint inflammation  Neuro: Awake, alert, conversing appropriately. No focal deficits. Back: Sacral decub      Ethelene Hal, MD 04/28/2022, 9:23 AM

## 2022-04-28 NOTE — Progress Notes (Signed)
PROGRESS NOTE                                                                                                                                                                                                             Patient Demographics:    [name], is a 46 y.o. female, DOB - 1975/11/19, ESP:[id number]  Outpatient Primary MD for the patient is [name], NP    LOS - 2  Admit date - 04/24/2022    Chief Complaint  Patient presents with   Leg Swelling       Brief Narrative (HPI from H&P)   46 year old female with history of bariatric surgery, hypertension, alcoholic hepatitis, chronic anemia, PUD, who was recently (admitted 7/16-7/29 for hypovolemic shock due to gastroenteritis from norovirus.  She was found to have alcoholic hepatitis, anemia, aspiration pneumonia, stage IV pressure ulcer sacral region.  Patient was seen by GI and general surgery.  Endoscopy had shown marginal ulcer without stigmata of bleeding, esophageal plaques suspicious for candidiasis.  Colonoscopy was deferred.  She was placed on prednisolone 40 mg daily till 8/19 for alcoholic hepatitis, PPI, sucralfate for PUD.  Patient was recommended to follow-up with GI at [facility].  Patient returns back to ED with worsening lower extremity swelling, 3+ edema, Anaemia, hypotension and admitted.   Subjective:   Patient in bed, appears comfortable, denies any headache, no fever, no chest pain or pressure, no shortness of breath , no abdominal pain. No new focal weakness.   Assessment  & Plan :   Anasarca due to alcoholic cirrhosis and portal hypertension, also has CKD stage IIIa - She is in frank fluid overload with 3+ edema, continue Lasix and Aldactone, she has already received multiple doses of albumin and will withhold further unless needed, applied Unna boots, fluid restriction.  Edema is improving.  Her discriminant factor is above  32, continue prednisolone which she was started last admission stop date 05/13/2022, also continue fluconazole that was ordered for 10 days per recommendations by GI last admission.  Nonacute abdominal ultrasound with trace ascites and stable appearing kidneys with no hydronephrosis.  GI is following.  She is claiming that she has quit alcohol 4 weeks ago requested to continue to abstain.   Alcoholic hepatitis  - as above.   Acute on chronic anemia, anemia of chronic  disease  -Patient underwent extensive work-up during recent admission.  She reported no frank blood loss since discharge.  EGD had shown marginal ulcer with no stigmata of acute bleeding, colonoscopy was deferred.  She also was noted to have hemorrhoids and likely some slow GI blood loss from the marginal ulcer.  Possibly bone marrow suppression from alcohol.  Patient was followed by hematology and GI.  Patient had received Aranesp on 04/21/22, Received 1 unit of packed RBC on 04/25/2022, panel inconclusive continue to monitor.  PUD/marginal ulcer on recent EGD  - Continue PPI, Carafate, continue Diflucan, was recommended total 10 days last admission.   Chronic thrombocytopenia  -Likely due to bone marrow suppression, alcoholic liver disease, monitor no signs of active bleeding   AKI on Chronic kidney disease stage 3B - Baseline creatinine was 0.9 about 1 year ago lately around 1.5, few weeks ago she had presented with creatinine of 6.38 on 04/09/22, fattening has plateaued around 1.5 after some fluid removal, likely this is her baseline.  Monitor with ongoing diuresis.  UA had no proteinuria, abdominal ultrasound nonacute and nephrology on board.   Hypokalemia and hypomagnesemia.  Replaced.    Hypotension.  Appears somewhat chronic.  Added midodrine.    Stage IV pressure ulcer of sacral region  - will consult wound care      Condition - Guarded  Family Communication  :  None present  Code Status :  Full  Consults  :  GI,  Renal  PUD Prophylaxis : PPI   Procedures  :     Abd [place] - 1. Hepatic steatosis. Mildly nodular liver contour, concerning for cirrhosis. 2. Trace perihepatic ascites.  No hydronephrosis.      Disposition Plan  :    Status is: Observation DVT Prophylaxis  :    Place and maintain sequential compression device Start: 04/25/22 1424  Lab Results  Component Value Date   PLT 111 (L) 04/28/2022    Diet :  Diet Order             Diet Heart Room service appropriate? Yes; Fluid consistency: Thin; Fluid restriction: 1500 mL Fluid  Diet effective now                    Inpatient Medications  Scheduled Meds:  ferrous sulfate  325 mg Oral Q breakfast   fluconazole  200 mg Oral Daily   folic acid  1 mg Oral Daily   furosemide  60 mg Oral Once   midodrine  10 mg Oral TID WC   pantoprazole  40 mg Oral BID AC   phytonadione  10 mg Oral Daily   prednisoLONE  40 mg Oral Daily   spironolactone  25 mg Oral Daily   sucralfate  1 g Oral Q6H   Continuous Infusions:   PRN Meds:.loperamide, ondansetron **OR** ondansetron (ZOFRAN) IV, traMADol  Time Spent in minutes  30   Susa Raring M.D on 04/28/2022 at 9:48 AM  To page go to [email or web address]   [facility] -  Office  [phone]  See all Orders from today for further details    Objective:   Vitals:   04/27/22 1634 04/27/22 1958 04/27/22 2335 04/28/22 0335  BP: 107/64 (!) 113/58 118/69 114/70  Pulse: (!) 55 (!) 51 67 (!) 47  Resp: 18 17 18 17   Temp: 97.7 F (36.5 C) 97.8 F (36.6 C) 98.7 F (37.1 C) (!) 97.5 F (36.4 C)  TempSrc: Oral Oral Oral Oral  SpO2:  95%  99%    Wt Readings from Last 3 Encounters:  04/22/22 100.4 kg  01/18/22 99.2 kg  09/30/21 104.5 kg    No intake or output data in the 24 hours ending 04/28/22 0948   Physical Exam  Awake Alert, No new F.N deficits, Normal affect Simpson.AT,PERRAL Supple Neck, No JVD,   Symmetrical Chest wall movement, Good air movement bilaterally,  CTAB RRR,No Gallops, Rubs or new Murmurs,  +ve B.Sounds, Abd Soft, No tenderness,   2+ edema - UNNA boots   Data Review:    CBC Recent Labs  Lab 04/22/22 0112 04/25/22 0539 04/26/22 0459 04/27/22 0450 04/28/22 0413  WBC 20.8* 18.6* 13.2* 14.0* 13.9*  HGB 8.6* 7.3* 8.2* 8.1* 8.1*  HCT 25.7* 21.9* 24.4* 23.2* 23.4*  PLT 61* 86* 83* 97* 111*  MCV 104.9* 107.4* 103.0* 101.3* 101.3*  MCH 35.1* 35.8* 34.6* 35.4* 35.1*  MCHC 33.5 33.3 33.6 34.9 34.6  RDW Not Measured Not Measured Not Measured Not Measured Not Measured  LYMPHSABS  --  1.6  --  1.3 1.0  MONOABS  --  1.1*  --  0.8 0.7  EOSABS  --  0.0  --  0.0 0.0  BASOSABS  --  0.0  --  0.0 0.0    Electrolytes Recent Labs  Lab 04/22/22 0112 04/24/22 1802 04/25/22 0539 04/25/22 1627 04/26/22 0459 04/27/22 0450 04/27/22 0500 04/28/22 0413  NA 138 137  --   --  139 137  --  135  K 3.6 4.1  --   --  3.2* 4.1  --  3.3*  CL 106 105  --   --  106 104  --  102  CO2 24 20*  --   --  22 22  --  23  GLUCOSE 128* 141*  --   --  142* 117*  --  130*  BUN 29* 24*  --   --  21* 26*  --  22*  CREATININE 1.58* 1.50*  --  1.44* 1.50* 1.69*  --  1.69*  CALCIUM 8.9 8.8*  --   --  8.6* 7.4*  --  8.9  AST 147*  --  122*  --  103* 71*  --  70*  ALT 142*  --  154*  --  143* 124*  --  118*  ALKPHOS 96  --  120  --  99 100  --  128*  BILITOT 9.7*  --  6.4*  --  6.5* 6.4*  --  5.1*  ALBUMIN 2.5*  --  2.4*  --  2.8* 2.9*  --  2.7*  MG  --   --   --   --   --  1.3*  --  2.0  INR  --   --  1.5*  --   --  1.6*  --  1.4*  BNP  --   --   --   --   --   --  804.0* 818.1*       Radiology Reports DG Ankle Complete Left  Result Date: 04/28/2022 CLINICAL DATA:  Increased swelling and pain today. EXAM: LEFT ANKLE COMPLETE - 3+ VIEW COMPARISON:  None Available. FINDINGS: There is overlying material that may represent casting material versus a compression stocking. The ankle mortise is symmetric and intact. Mild distal anterior tibial plafond and mild  adjacent dorsal talar head-neck junction degenerative osteophytosis on lateral view. Small plantar calcaneal heel spur. Minimal distal medial malleolar degenerative osteophytosis. No acute fracture or dislocation.  IMPRESSION: Mild anterior tibiotalar osteoarthritis. Electronically Signed   By: Yvonne Kendall M.D.   On: 04/28/2022 08:53   US Abdomen Complete  Result Date: 04/26/2022 CLINICAL DATA:  Cirrhosis EXAM: ABDOMEN ULTRASOUND COMPLETE COMPARISON:  None Available. FINDINGS: Gallbladder: Surgically absent. Common bile duct: Diameter: 4.8 mm Liver: No focal lesion identified. Slightly nodular liver contour with increased parenchymal echogenicity. Portal vein is patent on color Doppler imaging with normal direction of blood flow towards the liver. IVC: No abnormality visualized. Pancreas: Not visualized due to overlying bowel gas. Spleen: Size and appearance within normal limits. Right Kidney: Length: 14.2. Echogenicity within normal limits. No mass or hydronephrosis visualized. Left Kidney: Length: 13.2. Echogenicity within normal limits. No mass or hydronephrosis visualized. Abdominal aorta: No aneurysm visualized. Other findings: Trace ascites seen about the liver. IMPRESSION: 1. Hepatic steatosis. Mildly nodular liver contour, concerning for cirrhosis. 2. Trace perihepatic ascites. Electronically Signed   By: Yetta Glassman M.D.   On: 04/26/2022 11:35

## 2022-04-28 NOTE — Plan of Care (Signed)
  Problem: Education: Goal: Knowledge of General Education information will improve Description: Including pain rating scale, medication(s)/side effects and non-pharmacologic comfort measures Outcome: Progressing   Problem: Activity: Goal: Risk for activity intolerance will decrease Outcome: Progressing   Problem: Nutrition: Goal: Adequate nutrition will be maintained Outcome: Progressing   Problem: Coping: Goal: Level of anxiety will decrease Outcome: Progressing   

## 2022-04-29 ENCOUNTER — Encounter (HOSPITAL_BASED_OUTPATIENT_CLINIC_OR_DEPARTMENT_OTHER): Payer: Self-pay

## 2022-04-29 ENCOUNTER — Other Ambulatory Visit: Payer: Self-pay

## 2022-04-29 ENCOUNTER — Inpatient Hospital Stay (HOSPITAL_BASED_OUTPATIENT_CLINIC_OR_DEPARTMENT_OTHER)
Admission: EM | Admit: 2022-04-29 | Discharge: 2022-05-03 | DRG: 602 | Disposition: A | Discharge status: Home Health | Payer: Self-pay | Attending: Internal Medicine | Admitting: Internal Medicine

## 2022-04-29 DIAGNOSIS — Z9884 Bariatric surgery status: Secondary | ICD-10-CM

## 2022-04-29 DIAGNOSIS — K219 Gastro-esophageal reflux disease without esophagitis: Secondary | ICD-10-CM | POA: Diagnosis present

## 2022-04-29 DIAGNOSIS — E669 Obesity, unspecified: Secondary | ICD-10-CM | POA: Diagnosis present

## 2022-04-29 DIAGNOSIS — Z6833 Body mass index (BMI) 33.0-33.9, adult: Secondary | ICD-10-CM

## 2022-04-29 DIAGNOSIS — D696 Thrombocytopenia, unspecified: Secondary | ICD-10-CM | POA: Diagnosis present

## 2022-04-29 DIAGNOSIS — D649 Anemia, unspecified: Secondary | ICD-10-CM | POA: Diagnosis present

## 2022-04-29 DIAGNOSIS — N1832 Chronic kidney disease, stage 3b: Secondary | ICD-10-CM | POA: Diagnosis present

## 2022-04-29 DIAGNOSIS — L89154 Pressure ulcer of sacral region, stage 4: Secondary | ICD-10-CM | POA: Diagnosis present

## 2022-04-29 DIAGNOSIS — Z79899 Other long term (current) drug therapy: Secondary | ICD-10-CM

## 2022-04-29 DIAGNOSIS — R21 Rash and other nonspecific skin eruption: Principal | ICD-10-CM | POA: Diagnosis present

## 2022-04-29 DIAGNOSIS — K289 Gastrojejunal ulcer, unspecified as acute or chronic, without hemorrhage or perforation: Secondary | ICD-10-CM | POA: Diagnosis present

## 2022-04-29 DIAGNOSIS — K7030 Alcoholic cirrhosis of liver without ascites: Secondary | ICD-10-CM | POA: Diagnosis present

## 2022-04-29 DIAGNOSIS — N179 Acute kidney failure, unspecified: Secondary | ICD-10-CM | POA: Diagnosis present

## 2022-04-29 DIAGNOSIS — D6959 Other secondary thrombocytopenia: Secondary | ICD-10-CM | POA: Diagnosis present

## 2022-04-29 DIAGNOSIS — E8770 Fluid overload, unspecified: Secondary | ICD-10-CM | POA: Diagnosis present

## 2022-04-29 DIAGNOSIS — K766 Portal hypertension: Secondary | ICD-10-CM | POA: Diagnosis present

## 2022-04-29 DIAGNOSIS — K7010 Alcoholic hepatitis without ascites: Secondary | ICD-10-CM | POA: Diagnosis present

## 2022-04-29 DIAGNOSIS — D631 Anemia in chronic kidney disease: Secondary | ICD-10-CM | POA: Diagnosis present

## 2022-04-29 DIAGNOSIS — I959 Hypotension, unspecified: Secondary | ICD-10-CM | POA: Diagnosis present

## 2022-04-29 DIAGNOSIS — L03115 Cellulitis of right lower limb: Principal | ICD-10-CM | POA: Diagnosis present

## 2022-04-29 DIAGNOSIS — W57XXXA Bitten or stung by nonvenomous insect and other nonvenomous arthropods, initial encounter: Secondary | ICD-10-CM | POA: Diagnosis present

## 2022-04-29 DIAGNOSIS — I129 Hypertensive chronic kidney disease with stage 1 through stage 4 chronic kidney disease, or unspecified chronic kidney disease: Secondary | ICD-10-CM | POA: Diagnosis present

## 2022-04-29 DIAGNOSIS — D509 Iron deficiency anemia, unspecified: Secondary | ICD-10-CM | POA: Diagnosis present

## 2022-04-29 DIAGNOSIS — K649 Unspecified hemorrhoids: Secondary | ICD-10-CM | POA: Diagnosis present

## 2022-04-29 DIAGNOSIS — Y929 Unspecified place or not applicable: Secondary | ICD-10-CM

## 2022-04-29 DIAGNOSIS — R601 Generalized edema: Secondary | ICD-10-CM | POA: Diagnosis present

## 2022-04-29 DIAGNOSIS — B3781 Candidal esophagitis: Secondary | ICD-10-CM | POA: Diagnosis present

## 2022-04-29 DIAGNOSIS — F17210 Nicotine dependence, cigarettes, uncomplicated: Secondary | ICD-10-CM | POA: Diagnosis present

## 2022-04-29 DIAGNOSIS — Z91048 Other nonmedicinal substance allergy status: Secondary | ICD-10-CM

## 2022-04-29 DIAGNOSIS — L0390 Cellulitis, unspecified: Secondary | ICD-10-CM

## 2022-04-29 LAB — COMPREHENSIVE METABOLIC PANEL
ALT: 117 U/L — ABNORMAL HIGH (ref 0–44)
AST: 75 U/L — ABNORMAL HIGH (ref 15–41)
Albumin: 2.8 g/dL — ABNORMAL LOW (ref 3.5–5.0)
Alkaline Phosphatase: 138 U/L — ABNORMAL HIGH (ref 38–126)
Anion gap: 11 (ref 5–15)
BUN: 21 mg/dL — ABNORMAL HIGH (ref 6–20)
CO2: 21 mmol/L — ABNORMAL LOW (ref 22–32)
Calcium: 8.8 mg/dL — ABNORMAL LOW (ref 8.9–10.3)
Chloride: 101 mmol/L (ref 98–111)
Creatinine, Ser: 1.63 mg/dL — ABNORMAL HIGH (ref 0.44–1.00)
GFR, Estimated: 40 mL/min — ABNORMAL LOW (ref >60)
Glucose, Bld: 136 mg/dL — ABNORMAL HIGH (ref 70–99)
Potassium: 3.4 mmol/L — ABNORMAL LOW (ref 3.5–5.1)
Sodium: 133 mmol/L — ABNORMAL LOW (ref 135–145)
Total Bilirubin: 5.1 mg/dL — ABNORMAL HIGH (ref 0.3–1.2)
Total Protein: 6 g/dL — ABNORMAL LOW (ref 6.5–8.1)

## 2022-04-29 LAB — CBC WITH DIFFERENTIAL/PLATELET
Abs Immature Granulocytes: 0 10*3/uL (ref 0.00–0.07)
Basophils Absolute: 0 10*3/uL (ref 0.0–0.1)
Basophils Relative: 0 %
Eosinophils Absolute: 0.1 10*3/uL (ref 0.0–0.5)
Eosinophils Relative: 1 %
HCT: 24.7 % — ABNORMAL LOW (ref 36.0–46.0)
Hemoglobin: 8.3 g/dL — ABNORMAL LOW (ref 12.0–15.0)
Lymphocytes Relative: 4 %
Lymphs Abs: 0.6 10*3/uL — ABNORMAL LOW (ref 0.7–4.0)
MCH: 34.6 pg — ABNORMAL HIGH (ref 26.0–34.0)
MCHC: 33.6 g/dL (ref 30.0–36.0)
MCV: 102.9 fL — ABNORMAL HIGH (ref 80.0–100.0)
Monocytes Absolute: 0.4 10*3/uL (ref 0.1–1.0)
Monocytes Relative: 3 %
Neutro Abs: 13.2 10*3/uL — ABNORMAL HIGH (ref 1.7–7.7)
Neutrophils Relative %: 92 %
Platelets: 108 10*3/uL — ABNORMAL LOW (ref 150–400)
RBC: 2.4 MIL/uL — ABNORMAL LOW (ref 3.87–5.11)
Smear Review: NORMAL
WBC: 14.4 10*3/uL — ABNORMAL HIGH (ref 4.0–10.5)
nRBC: 0 /100 WBC
nRBC: 0.1 % (ref 0.0–0.2)

## 2022-04-29 LAB — MAGNESIUM: Magnesium: 1.7 mg/dL (ref 1.7–2.4)

## 2022-04-29 LAB — PROTIME-INR
INR: 1.4 — ABNORMAL HIGH (ref 0.8–1.2)
Prothrombin Time: 17.3 seconds — ABNORMAL HIGH (ref 11.4–15.2)

## 2022-04-29 LAB — BRAIN NATRIURETIC PEPTIDE: B Natriuretic Peptide: 967.2 pg/mL — ABNORMAL HIGH (ref 0.0–100.0)

## 2022-04-29 MED ORDER — SPIRONOLACTONE 25 MG PO TABS
50.0000 mg | ORAL_TABLET | Freq: Every day | ORAL | Status: DC
  Administered 2022-04-29: 50 mg via ORAL
  Filled 2022-04-29: qty 2

## 2022-04-29 MED ORDER — POTASSIUM CHLORIDE ER 10 MEQ PO TBCR: 10.0000 meq | EXTENDED_RELEASE_TABLET | Freq: Every day | ORAL | 0 refills | Status: DC

## 2022-04-29 MED ORDER — POTASSIUM CHLORIDE CRYS ER 20 MEQ PO TBCR
20.0000 meq | EXTENDED_RELEASE_TABLET | Freq: Once | ORAL | Status: AC
  Administered 2022-04-29: 20 meq via ORAL
  Filled 2022-04-29: qty 1

## 2022-04-29 MED ORDER — POTASSIUM CHLORIDE CRYS ER 20 MEQ PO TBCR: 40.0000 meq | EXTENDED_RELEASE_TABLET | Freq: Two times a day (BID) | ORAL | Status: DC

## 2022-04-29 MED ORDER — SPIRONOLACTONE 50 MG PO TABS: 50.0000 mg | ORAL_TABLET | Freq: Every day | ORAL | 0 refills | Status: DC

## 2022-04-29 MED ORDER — FUROSEMIDE 40 MG PO TABS: 40.0000 mg | ORAL_TABLET | Freq: Every day | ORAL | 0 refills | Status: DC

## 2022-04-29 MED ORDER — MIDODRINE HCL 10 MG PO TABS: 10.0000 mg | ORAL_TABLET | Freq: Three times a day (TID) | ORAL | 0 refills | Status: DC

## 2022-04-29 MED ORDER — FUROSEMIDE 40 MG PO TABS
60.0000 mg | ORAL_TABLET | Freq: Once | ORAL | Status: AC
  Administered 2022-04-29: 60 mg via ORAL
  Filled 2022-04-29: qty 1

## 2022-04-29 MED ORDER — POTASSIUM CHLORIDE CRYS ER 20 MEQ PO TBCR
40.0000 meq | EXTENDED_RELEASE_TABLET | Freq: Once | ORAL | Status: AC
  Administered 2022-04-29: 40 meq via ORAL
  Filled 2022-04-29: qty 2

## 2022-04-29 NOTE — ED Provider Notes (Signed)
[facility] Provider Note: Lori Dell, MD, FACEP  CSN: [id number] MRN: [id number] ARRIVAL: 04/29/22 at 1841 ROOM: DB004/DB004   CHIEF COMPLAINT  Rash   HISTORY OF PRESENT ILLNESS  04/29/22 10:59 PM [name] is a 46 y.o. female with anemia and alcoholic cirrhosis who was admitted to [facility] on 04/24/2022 and discharged earlier today.  She was admitted for anasarca associated with her cirrhosis and portal hypertension.  She is also noted to have chronic kidney disease stage III.  She has chronic anemia and received a blood transfusion while an inpatient.  Her laboratory studies, while abnormal, have been stable for the past several days of her admission.  She is here with a rash that appeared on her right inner thigh about an hour prior to arrival while she was sitting outside.  She does not know if she was bitten, stung by something.  There is no itching but there is a burning sensation at the site.  She is not having pain at rest but it is tender to palpation.  She denies shortness of breath.   Past Medical History:  Diagnosis Date   Acid reflux    Anemia    Ulcer     Past Surgical History:  Procedure Laterality Date   ABDOMINAL SURGERY     abdominoplasty   BIOPSY  04/18/2022   Procedure: BIOPSY;  Surgeon: Lynann Bologna, MD;  Location: [facility] ENDOSCOPY;  Service: Gastroenterology;;   BREAST BIOPSY Right 2013   CHOLECYSTECTOMY N/A 11/23/2019   Procedure: LAPAROSCOPIC CHOLECYSTECTOMY WITH INTRAOPERATIVE CHOLANGIOGRAM;  Surgeon: Rodman Pickle, MD;  Location: [facility] OR;  Service: General;  Laterality: N/A;   COSMETIC SURGERY     tummy tuck   ESOPHAGOGASTRODUODENOSCOPY (EGD) WITH PROPOFOL N/A 04/18/2022   Procedure: ESOPHAGOGASTRODUODENOSCOPY (EGD) WITH PROPOFOL;  Surgeon: Lynann Bologna, MD;  Location: [facility] ENDOSCOPY;  Service: Gastroenterology;  Laterality: N/A;   HERNIA REPAIR     ROUX-EN-Y PROCEDURE     TUMOR REMOVAL     WRIST GANGLION EXCISION      Family  History  Problem Relation Age of Onset   Cancer Mother        kidney   Cancer Paternal Aunt    Breast cancer Maternal Aunt    Pancreatic cancer Maternal Aunt    Lung cancer Paternal Uncle     Social History   Tobacco Use   Smoking status: Every Day    Packs/day: 0.25    Years: 25.00    Total pack years: 6.25    Types: Cigarettes   Smokeless tobacco: Never  Vaping Use   Vaping Use: Never used  Substance Use Topics   Alcohol use: Not Currently    Alcohol/week: 12.0 standard drinks of alcohol    Types: 12 Glasses of wine per week   Drug use: No    Prior to Admission medications   Medication Sig Start Date End Date Taking? Authorizing Provider  busPIRone (BUSPAR) 15 MG tablet Take 15 mg by mouth 3 (three) times daily as needed (anxiety). 06/09/21   [provider]  Cyanocobalamin (B-12 PO) Take 1 tablet by mouth daily.    [provider]  ferrous sulfate 325 (65 FE) MG tablet Take 1 tablet (325 mg total) by mouth daily with breakfast. 04/23/22   Danford, Earl Lites, MD  fluconazole (DIFLUCAN) 200 MG tablet Take 1 tablet (200 mg total) by mouth daily. 04/22/22   Danford, Earl Lites, MD  folic acid (FOLVITE) 1 MG tablet Take 1  tablet (1 mg total) by mouth daily. 04/22/22   Danford, Suann Larry, MD  furosemide (LASIX) 40 MG tablet Take 1 tablet (40 mg total) by mouth daily for 15 days. 04/29/22 05/14/22  Thurnell Lose, MD  loperamide (IMODIUM) 2 MG capsule Take 1 capsule (2 mg total) by mouth as needed for diarrhea or loose stools. 04/22/22   Danford, Suann Larry, MD  midodrine (PROAMATINE) 10 MG tablet Take 1 tablet (10 mg total) by mouth 3 (three) times daily with meals. 04/29/22   Thurnell Lose, MD  pantoprazole (PROTONIX) 40 MG tablet Take 1 tablet (40 mg total) by mouth 2 (two) times daily before a meal. 04/22/22   Danford, Suann Larry, MD  potassium chloride (KLOR-CON) 10 MEQ tablet Take 1 tablet (10 mEq total) by mouth daily. 04/29/22   Thurnell Lose, MD  prednisoLONE 5 MG TABS tablet Take 8 tablets (40 mg total) by mouth daily for 19 days. 04/22/22 05/11/22  Danford, Suann Larry, MD  spironolactone (ALDACTONE) 50 MG tablet Take 1 tablet (50 mg total) by mouth daily. 04/29/22   Thurnell Lose, MD  sucralfate (CARAFATE) 1 GM/10ML suspension Take 10 mLs (1 g total) by mouth every 6 (six) hours. 04/22/22   Danford, Suann Larry, MD    Allergies Tape   REVIEW OF SYSTEMS  Negative except as noted here or in the History of Present Illness.   PHYSICAL EXAMINATION  Initial Vital Signs Blood pressure (!) 100/53, pulse 66, temperature 98.6 F (37 C), temperature source Oral, resp. rate 20, height 5\' 6"  (1.676 m), weight 98 kg, last menstrual period 03/10/2022, SpO2 98 %.  Examination General: Well-developed, well-nourished female in no acute distress; appears older than age of record HENT: normocephalic; atraumatic Eyes: pupils equal, round and reactive to light; extraocular muscles intact; scleral icterus Neck: supple Heart: regular rate and rhythm Lungs: clear to auscultation bilaterally Abdomen: soft; nondistended; nontender; hepatosplenomegaly; bowel sounds present Extremities: No deformity; full range of motion; Unna boots in place bilaterally Neurologic: Awake, alert and oriented; motor function intact in all extremities and symmetric; no facial droop Skin: Warm and dry; tender, warm, erythematous area involving about 50% of the right thigh:       Psychiatric: Normal mood and affect   RESULTS  Summary of this visit's results, reviewed and interpreted by myself:   EKG Interpretation  Date/Time:    Ventricular Rate:    PR Interval:    QRS Duration:   QT Interval:    QTC Calculation:   R Axis:     Text Interpretation:         Laboratory Studies: Results for orders placed or performed during the hospital encounter of 04/29/22 (from the past 24 hour(s))  CBC with Differential     Status: Abnormal   Collection  Time: 04/29/22 11:46 PM  Result Value Ref Range   WBC 15.8 (H) 4.0 - 10.5 K/uL   RBC 2.65 (L) 3.87 - 5.11 MIL/uL   Hemoglobin 9.1 (L) 12.0 - 15.0 g/dL   HCT 26.7 (L) 36.0 - 46.0 %   MCV 100.8 (H) 80.0 - 100.0 fL   MCH 34.3 (H) 26.0 - 34.0 pg   MCHC 34.1 30.0 - 36.0 g/dL   RDW Not Measured 11.5 - 15.5 %   Platelets 122 (L) 150 - 400 K/uL   nRBC 0.0 0.0 - 0.2 %   Neutrophils Relative % 89 %   Neutro Abs 14.2 (H) 1.7 - 7.7 K/uL   Lymphocytes Relative  6 %   Lymphs Abs 1.0 0.7 - 4.0 K/uL   Monocytes Relative 4 %   Monocytes Absolute 0.6 0.1 - 1.0 K/uL   Eosinophils Relative 0 %   Eosinophils Absolute 0.0 0.0 - 0.5 K/uL   Basophils Relative 0 %   Basophils Absolute 0.0 0.0 - 0.1 K/uL   Smear Review MORPHOLOGY UNREMARKABLE    Immature Granulocytes 1 %   Abs Immature Granulocytes 0.08 (H) 0.00 - 0.07 K/uL   Dimorphism PRESENT    Polychromasia PRESENT    Target Cells PRESENT   Procalcitonin - Baseline     Status: None   Collection Time: 04/29/22 11:46 PM  Result Value Ref Range   Procalcitonin 0.12 ng/mL   Imaging Studies: DG Ankle Complete Left  Result Date: 04/28/2022 CLINICAL DATA:  Increased swelling and pain today. EXAM: LEFT ANKLE COMPLETE - 3+ VIEW COMPARISON:  None Available. FINDINGS: There is overlying material that may represent casting material versus a compression stocking. The ankle mortise is symmetric and intact. Mild distal anterior tibial plafond and mild adjacent dorsal talar head-neck junction degenerative osteophytosis on lateral view. Small plantar calcaneal heel spur. Minimal distal medial malleolar degenerative osteophytosis. No acute fracture or dislocation. IMPRESSION: Mild anterior tibiotalar osteoarthritis. Electronically Signed   By: Neita Garnet M.D.   On: 04/28/2022 08:53    ED COURSE and MDM  Nursing notes, initial and subsequent vitals signs, including pulse oximetry, reviewed and interpreted by myself.  Vitals:   04/29/22 2356 04/29/22 2356 04/30/22  0000 04/30/22 0100  BP: (!) 117/55  (!) 111/59   Pulse: 68  72 74  Resp: 18     Temp:  98.6 F (37 C)    TempSrc:  Oral    SpO2: 99%  98% 100%  Weight:      Height:       Medications - No data to display  1:37 AM The patient's hemoglobin has not dropped and her leukocytosis is not significantly worse than yesterday.  Her procalcitonin is only 0.12 which is not consistent with a bacterial infection.  Discussed with Dr. Julian Reil and Dr. Imogene Burn who would like to place the patient in observation overnight.  Highest on the differential diagnosis is a hematoma.  As noted above I am less convinced it is a cellulitis and I would not expect a vasculitis to be this well localized.  PROCEDURES  Procedures   ED DIAGNOSES     ICD-10-CM   1. Rash  R21          Chelle Cayton, Jonny Ruiz, MD 04/30/22 [id number]

## 2022-04-29 NOTE — Progress Notes (Signed)
Patient is alert and oriented, verbalized understanding of discharge instructions. Exited unit via w/c accompanied by 2 staff members with all belongings in no distress.

## 2022-04-29 NOTE — Progress Notes (Signed)
[facility] Progress Note   46 y.o. female HTN, alcoholic hepatitis, chronic anemia, PUD recently admitted with gastroenteritis with a complicated course including aspiration PNA, stage IV sacral decubitus ulcer, endosscopy with plaques possibly from candidiasis. She presents this admission with worsening lower extremity swelling and fatigue. Of note the Cr was 1.6 at time of last discharge on 7/28.   Assessment/ Plan:   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 She was seen by [facility] prior admission with AKI thought to be secondary to HRS and hypotension. At time of d/c 7/29 her weight was 100.4kg. - Likely has a new baseline now ~1.4-1.6 with 1.4 possibly being diluted with massive volume overload at time of admission; therefore 1.6 may actually be her baseline. Fortunately, her renal function remains stable with aggressive diuresis which she needs.   Signing off at this time; please reconsult as needed. May f/u [facility] 6-8 weeks after d/c.   - Avoid nephrotoxic medications including NSAIDs and iodinated contrast, unless absolutely necessary.  Preferred narcotic agents for pain control are hydromorphone, fentanyl, and methadone. Morphine should not be used. Avoid Baclofen and avoid oral sodium phosphate and magnesium citrate based laxatives / bowel preps.    - Continue strict Input and Output monitoring. Will - Monitor the patient closely with you and intervene or adjust therapy as indicated by changes in clinical status/labs. Cont diuresis as you are doing; still markedly overloaded.    Alcoholic cirrhosis with portal hypertension being diuresed with marked fluid overload. Anemia of chronic disease s/p transfusion and EGD prior admission. Stage IV sacral decubitus ulcer  Subjective:   Feeling better but asking for the UNNA boots to be changed; denies fever, chills, dyspnea,  nausea.   Objective:   BP 104/81 (BP Location: Left Wrist)   Pulse (!) 54   Temp 98.7 F (37.1 C) (Oral)   Resp 20   LMP 03/10/2022 (Approximate)   SpO2 100%  No intake or output data in the 24 hours ending 04/29/22 0724 Weight change:   Physical Exam: General: NAD Heart: Regular rate, rhythm. No murmurs. Lungs: clear b/l, no rales noted b/l. Abdomen: Soft, non-tender, non-distended. Extremities: 1+  pedal edema but with UNNA boots. Musculoskeletal: no signs of joint inflammation  Neuro: Awake, alert, conversing appropriately. No focal deficits. Back: Sacral decub    Imaging: DG Ankle Complete Left  Result Date: 04/28/2022 CLINICAL DATA:  Increased swelling and pain today. EXAM: LEFT ANKLE COMPLETE - 3+ VIEW COMPARISON:  None Available. FINDINGS: There is overlying material that may represent casting material versus a compression stocking. The ankle mortise is symmetric and intact. Mild distal anterior tibial plafond and mild adjacent dorsal talar head-neck junction degenerative osteophytosis on lateral view. Small plantar calcaneal heel spur. Minimal distal medial malleolar degenerative osteophytosis. No acute fracture or dislocation. IMPRESSION: Mild anterior tibiotalar osteoarthritis. Electronically Signed   By: Neita Garnet M.D.   On: 04/28/2022 08:53    Labs: BMET Recent Labs  Lab 04/24/22 1802 04/25/22 1627 04/26/22 0459 04/27/22 0450 04/28/22 0413 04/29/22 0306  NA 137  --  139 137 135 133*  K 4.1  --  3.2* 4.1 3.3* 3.4*  CL 105  --  106 104 102 101  CO2 20*  --  22 22 23  21*  GLUCOSE 141*  --  142* 117* 130* 136*  BUN 24*  --  21* 26* 22* 21*  CREATININE 1.50* 1.44* 1.50* 1.69* 1.69* 1.63*  CALCIUM 8.8*  --  8.6* 7.4* 8.9 8.8*   CBC Recent Labs  Lab 04/25/22 0539 04/26/22 0459 04/27/22 0450 04/28/22 0413 04/29/22 0306  WBC 18.6* 13.2* 14.0* 13.9* 14.4*  NEUTROABS 15.7*  --  11.9* 12.2* 13.2*  HGB 7.3* 8.2* 8.1* 8.1* 8.3*  HCT 21.9* 24.4* 23.2* 23.4*  24.7*  MCV 107.4* 103.0* 101.3* 101.3* 102.9*  PLT 86* 83* 97* 111* 108*    Medications:     ferrous sulfate  325 mg Oral Q breakfast   fluconazole  200 mg Oral Daily   folic acid  1 mg Oral Daily   furosemide  60 mg Oral Once   midodrine  10 mg Oral TID WC   pantoprazole  40 mg Oral BID AC   phytonadione  10 mg Oral Daily   potassium chloride  40 mEq Oral Once   prednisoLONE  40 mg Oral Daily   spironolactone  50 mg Oral Daily   sucralfate  1 g Oral Q6H      Paulene Floor, MD 04/29/2022, 7:24 AM

## 2022-04-29 NOTE — TOC Transition Note (Addendum)
Transition of Care [facility]) - CM/SW Discharge Note   Patient Details  Name: [name] MRN: [id number] Date of Birth: 09-04-1976  [facility] CM/SW Contact:  Lawerance Sabal, RN Phone Number: 04/29/2022, 9:46 AM   Clinical Narrative:   [facility] reviewing for [facility] RN. MATCH still in dates expires 8/6. Reprinted to unit and instructed nurse to provide to her.   [facility] unable to accept. [facility] will accept.       Barriers to Discharge: Continued Medical Work up   Patient Goals and CMS Choice Patient states their goals for this hospitalization and ongoing recovery are:: return home      Discharge Placement                       Discharge Plan and Services                                     Social Determinants of Health (SDOH) Interventions     Readmission Risk Interventions     No data to display

## 2022-04-29 NOTE — Plan of Care (Signed)

## 2022-04-29 NOTE — Progress Notes (Signed)
Orthopedic Tech Progress Note Patient Details:  [name] [birth year] [id number]  New unna boots applied to BLE before d/c home. Pt is able to take unna boots off by herself in 5-7 days. Encouraged elevation of feet when possible.  Ortho Devices Type of Ortho Device: Radio broadcast assistant Ortho Device/Splint Location: BLE Ortho Device/Splint Interventions: Application, Ordered, Adjustment   Post Interventions Patient Tolerated: Well Instructions Provided: Care of device  [name] 04/29/2022, 11:15 AM

## 2022-04-29 NOTE — Discharge Summary (Signed)
[name] [id number] DOB: July 07, 1976 DOA: 04/24/2022  PCP: Kerin Perna, NP  Admit date: 04/24/2022  Discharge date: 04/29/2022  Admitted From: Home   Disposition:  Home   Recommendations for Outpatient Follow-up:   Follow up with PCP in 1-2 weeks  PCP Please obtain BMP/CBC, 2 view CXR in 1week,  (see Discharge instructions)   PCP Please follow up on the following pending results:    Home Health: Home RN for wound care ordered if she qualifies or else wound care at PCP office, currently not actively infected. Equipment/Devices: None  Consultations: GI, renal Discharge Condition: Stable    CODE STATUS: Full    Diet Recommendation: Heart healthy diet with 1.5 L strict fluid restriction Chief Complaint  Patient presents with   Leg Swelling     Brief history of present illness from the day of admission and additional interim summary    46 year old female with history of bariatric surgery, hypertension, alcoholic hepatitis, chronic anemia, PUD, who was recently (admitted 7/16-7/29 for hypovolemic shock due to gastroenteritis from norovirus.  She was found to have alcoholic hepatitis, anemia, aspiration pneumonia, stage IV pressure ulcer sacral region.  Patient was seen by GI and general surgery.  Endoscopy had shown marginal ulcer without stigmata of bleeding, esophageal plaques suspicious for candidiasis.  Colonoscopy was deferred.  She was placed on prednisolone 40 mg daily till [id number] for alcoholic hepatitis, PPI, sucralfate for PUD.  Patient was recommended to follow-up with GI at [facility].  Patient returns back to ED with worsening lower extremity swelling, 3+ edema, Anaemia, hypotension and admitted.                                                                 Hospital Course    Anasarca due to alcoholic cirrhosis and portal hypertension, also has CKD stage IIIa - She is in frank fluid overload with 3+ edema, continue Lasix and Aldactone, she has already received multiple doses of albumin and will withhold further unless needed, applied Unna boots, fluid restriction.  Edema is improving.   Her discriminant factor is above 32, continue prednisolone which she was started last admission stop date 05/13/2022, also continue fluconazole that was ordered for 10 days per recommendations by GI last admission.  Nonacute abdominal ultrasound with trace ascites and stable appearing kidneys with no hydronephrosis.  In by GI will continue to follow her in the outpatient setting postdischarge, GI has currently signed off.  She is claiming that she has quit alcohol 4 weeks ago requested to continue to abstain.   Alcoholic hepatitis  - as above.   Acute on chronic anemia, anemia of chronic disease  -Patient underwent extensive work-up during recent admission.  She reported no frank blood loss since discharge.  EGD had shown marginal ulcer with  no stigmata of acute bleeding, colonoscopy was deferred.  She also was noted to have hemorrhoids and likely some slow GI blood loss from the marginal ulcer.  Possibly bone marrow suppression from alcohol.  Patient was followed by hematology and GI.  Patient had received Aranesp on 04/21/22, Received 1 unit of packed RBC on 04/25/2022, panel inconclusive continue to monitor.  PCP to monitor.   PUD/marginal ulcer on recent EGD  - Continue PPI, Carafate, continue Diflucan, was recommended total 10 days last admission.  Continued medications from previous admission unchanged.  Counseled on compliance outpatient GI follow-up postdischarge.   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  Monitor with ongoing diuresis.  UA had no proteinuria, abdominal ultrasound nonacute and in by nephrology will follow outpatient, they have signed off.  Continue gentle diuresis upon discharge.   Hypokalemia and hypomagnesemia.  Replaced.     Hypotension.  Stable after midodrine which will be continued.   Stage IV pressure ulcer of sacral region  -wound care consulted, this is chronic and was present on admission, is not actively infected, will order RN wound care at home if she qualifies for it if not it will be done at PCP office.   Discharge diagnosis     Principal Problem:   Anemia Active Problems:   Iron deficiency anemia   AKI (acute kidney injury) (HCC)   Anasarca   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HCC)   Marginal ulcer   Obesity (BMI 30-39.9)    Discharge instructions    Discharge Instructions     Discharge instructions   Complete by: As directed    Continue wound care as instructed either at home or at PCP office.    Follow with Primary MD [name], NP in 3 days   Get CBC, CMP, 2 view Chest X ray -  checked next visit within 3 days by Primary MD    Activity: As tolerated with Full fall precautions use walker/cane & assistance as needed  Disposition Home    Diet: Heart Healthy with strict 1.5 L fluid restriction per day  Check your Weight same time everyday, if you gain over 2 pounds, or you develop in leg swelling, experience more shortness of breath or chest pain, call your Primary MD immediately. Follow Cardiac Low Salt Diet and 1.5 lit/day fluid restriction.  Special Instructions: If you have smoked or chewed Tobacco  in the last 2 yrs please stop smoking, stop any regular Alcohol  and or any Recreational drug use.  On your next visit with your primary care physician please Get Medicines  reviewed and adjusted.  Please request your Prim.MD to go over all Hospital Tests and Procedure/Radiological results at the follow up, please get all Hospital records sent to your Prim MD by signing hospital release before you go home.  If you experience worsening of your admission symptoms, develop shortness of breath, life threatening emergency, suicidal or homicidal thoughts you must seek medical attention immediately by calling 911 or calling your MD immediately  if symptoms less severe.  You Must read complete instructions/literature along with all the possible adverse reactions/side effects for all the Medicines you take and  that have been prescribed to you. Take any new Medicines after you have completely understood and accpet all the possible adverse reactions/side effects.   Discharge wound care:   Complete by: As directed    1.        Flush wound well with saline (using large toomy syringe) 2.         Pack wound bed, including undermining with silver hydrofiber to wound base (Aquacel [employer] #[id number])-order from materials  3.         Fluff fill remainder of the wound bed with kerlix  4.         Top with ABD pad, secure with tape. Change  daily.   Increase activity slowly   Complete by: As directed        Discharge Medications   Allergies as of 04/29/2022       Reactions   Tape Rash   Clear tape        Medication List     TAKE these medications    B-12 PO Take 1 tablet by mouth daily.   busPIRone 15 MG tablet Commonly known as: BUSPAR Take 15 mg by mouth 3 (three) times daily as needed (anxiety).   ferrous sulfate 325 (65 FE) MG tablet Take 1 tablet (325 mg total) by mouth daily with breakfast.   fluconazole 200 MG tablet Commonly known as: Diflucan Take 1 tablet (200 mg total) by mouth daily.   folic acid 1 MG tablet Commonly known as: FOLVITE Take 1 tablet (1 mg total) by mouth daily.   furosemide 40 MG tablet Commonly known as: Lasix Take 1 tablet  (40 mg total) by mouth daily for 15 days.   loperamide 2 MG capsule Commonly known as: IMODIUM Take 1 capsule (2 mg total) by mouth as needed for diarrhea or loose stools.   midodrine 10 MG tablet Commonly known as: PROAMATINE Take 1 tablet (10 mg total) by mouth 3 (three) times daily with meals.   pantoprazole 40 MG tablet Commonly known as: PROTONIX Take 1 tablet (40 mg total) by mouth 2 (two) times daily before a meal.   potassium chloride 10 MEQ tablet Commonly known as: KLOR-CON Take 1 tablet (10 mEq total) by mouth daily.   prednisoLONE 5 MG Tabs tablet Take 8 tablets (40 mg total) by mouth daily for 19 days. What changed: Another medication with the same name was removed. Continue taking this medication, and follow the directions you see here.   spironolactone 50 MG tablet Commonly known as: ALDACTONE Take 1 tablet (50 mg total) by mouth daily.   sucralfate 1 GM/10ML suspension Commonly known as: CARAFATE Take 10 mLs (1 g total) by mouth every 6 (six) hours.               Discharge Care Instructions  (From admission, onward)           Start     Ordered   04/29/22 0000  Discharge wound care:       Comments: 1.        Flush wound well with saline (using large toomy syringe) 2.         Pack wound bed, including undermining with silver hydrofiber to wound base (Aquacel Ag[employer] #[id number])-order from materials  3.         Fluff fill remainder of the wound bed with kerlix  4.         Top with ABD pad, secure with tape. Change  daily.  04/29/22 [id number]             Follow-up Information     Kerin Perna, NP. Schedule an appointment as soon as possible for a visit in 3 day(s).   Specialty: Internal Medicine Contact information: [place] [address] [phone]         Jackquline Denmark, MD. Schedule an appointment as soon as possible for a visit in 1 week(s).   Specialties: Gastroenterology, Internal Medicine Contact  information: [place]. [place] [address] [phone]         Dwana Melena, MD. Schedule an appointment as soon as possible for a visit in 1 week(s).   Specialty: Nephrology Contact information: [place] [address] [phone]                 Major procedures and Radiology Reports - PLEASE review detailed and final reports thoroughly  -     DG Ankle Complete Left  Result Date: 04/28/2022 CLINICAL DATA:  Increased swelling and pain today. EXAM: LEFT ANKLE COMPLETE - 3+ VIEW COMPARISON:  None Available. FINDINGS: There is overlying material that may represent casting material versus a compression stocking. The ankle mortise is symmetric and intact. Mild distal anterior tibial plafond and mild adjacent dorsal talar head-neck junction degenerative osteophytosis on lateral view. Small plantar calcaneal heel spur. Minimal distal medial malleolar degenerative osteophytosis. No acute fracture or dislocation. IMPRESSION: Mild anterior tibiotalar osteoarthritis. Electronically Signed   By: Yvonne Kendall M.D.   On: 04/28/2022 08:53   US Abdomen Complete  Result Date: 04/26/2022 CLINICAL DATA:  Cirrhosis EXAM: ABDOMEN ULTRASOUND COMPLETE COMPARISON:  None Available. FINDINGS: Gallbladder: Surgically absent. Common bile duct: Diameter: 4.8 mm Liver: No focal lesion identified. Slightly nodular liver contour with increased parenchymal echogenicity. Portal vein is patent on color Doppler imaging with normal direction of blood flow towards the liver. IVC: No abnormality visualized. Pancreas: Not visualized due to overlying bowel gas. Spleen: Size and appearance within normal limits. Right Kidney: Length: 14.2. Echogenicity within normal limits. No mass or hydronephrosis visualized. Left Kidney: Length: 13.2. Echogenicity within normal limits. No mass or hydronephrosis visualized. Abdominal aorta: No aneurysm visualized. Other findings: Trace ascites seen about the liver.  IMPRESSION: 1. Hepatic steatosis. Mildly nodular liver contour, concerning for cirrhosis. 2. Trace perihepatic ascites. Electronically Signed   By: Yetta Glassman M.D.   On: 04/26/2022 11:35     Today   Subjective    [name] today has no headache,no chest abdominal pain,no new weakness tingling or numbness, feels much better   Objective   Blood pressure 106/67, pulse 69, temperature 97.8 F (36.6 C), temperature source Oral, resp. rate 18, last menstrual period 03/10/2022, SpO2 100 %.  No intake or output data in the 24 hours ending 04/29/22 0922  Exam  Awake Alert, No new F.N deficits,    [place].AT,PERRAL Supple Neck,   Symmetrical Chest wall movement, Good air movement bilaterally, CTAB RRR,No Gallops,   +ve B.Sounds, Abd Soft, Non tender,  No Cyanosis, 1+ edema    Data Review   Recent Labs  Lab 04/25/22 0539 04/26/22 0459 04/27/22 0450 04/28/22 0413 04/29/22 0306  WBC 18.6* 13.2* 14.0* 13.9* 14.4*  HGB 7.3* 8.2* 8.1* 8.1* 8.3*  HCT 21.9* 24.4* 23.2* 23.4* 24.7*  PLT 86* 83* 97* 111* 108*  MCV 107.4* 103.0* 101.3* 101.3* 102.9*  MCH 35.8* 34.6* 35.4* 35.1* 34.6*  MCHC 33.3 33.6 34.9 34.6 33.6  RDW Not Measured Not Measured  Not Measured Not Measured Not Measured  LYMPHSABS 1.6  --  1.3 1.0 0.6*  MONOABS 1.1*  --  0.8 0.7 0.4  EOSABS 0.0  --  0.0 0.0 0.1  BASOSABS 0.0  --  0.0 0.0 0.0    Recent Labs  Lab 04/24/22 1802 04/25/22 0539 04/25/22 1627 04/26/22 0459 04/27/22 0450 04/27/22 0500 04/28/22 0413 04/29/22 0306  NA 137  --   --  139 137  --  135 133*  K 4.1  --   --  3.2* 4.1  --  3.3* 3.4*  CL 105  --   --  106 104  --  102 101  CO2 20*  --   --  22 22  --  23 21*  GLUCOSE 141*  --   --  142* 117*  --  130* 136*  BUN 24*  --   --  21* 26*  --  22* 21*  CREATININE 1.50*  --  1.44* 1.50* 1.69*  --  1.69* 1.63*  CALCIUM 8.8*  --   --  8.6* 7.4*  --  8.9 8.8*  AST  --  122*  --  103* 71*  --  70* 75*  ALT  --  154*  --  143* 124*  --  118* 117*   ALKPHOS  --  120  --  99 100  --  128* 138*  BILITOT  --  6.4*  --  6.5* 6.4*  --  5.1* 5.1*  ALBUMIN  --  2.4*  --  2.8* 2.9*  --  2.7* 2.8*  MG  --   --   --   --  1.3*  --  2.0 1.7  INR  --  1.5*  --   --  1.6*  --  1.4* 1.4*  BNP  --   --   --   --   --  804.0* 818.1* 967.2*    Total Time in preparing paper work, data evaluation and todays exam - 35 minutes  Lala Lund M.D on 04/29/2022 at 9:22 AM  [facility]

## 2022-04-29 NOTE — ED Triage Notes (Signed)
Pt presents to the ED POV from home with family. States that the rash on then inner aspect of her right thigh came up about an hour ago while she was sitting outside. Does not know if she was bit by something or not. Denies itching. Reports a burning sensation from the rash. Pt denies pain or SHOB. Was recently hospitalized where she received multiple blood transfusions. Rash localized to right leg.

## 2022-04-29 NOTE — Plan of Care (Signed)
  Problem: Clinical Measurements: Goal: Ability to maintain clinical measurements within normal limits will improve Outcome: Progressing Goal: Diagnostic test results will improve Outcome: Progressing   Problem: Clinical Measurements: Goal: Diagnostic test results will improve Outcome: Progressing   Problem: Pain Managment: Goal: General experience of comfort will improve Outcome: Progressing   Problem: Skin Integrity: Goal: Risk for impaired skin integrity will decrease Outcome: Progressing   Problem: Skin Integrity: Goal: Risk for impaired skin integrity will decrease Outcome: Progressing

## 2022-04-30 ENCOUNTER — Observation Stay (HOSPITAL_COMMUNITY): Payer: Self-pay

## 2022-04-30 ENCOUNTER — Encounter (HOSPITAL_COMMUNITY): Payer: Self-pay

## 2022-04-30 DIAGNOSIS — R21 Rash and other nonspecific skin eruption: Secondary | ICD-10-CM | POA: Diagnosis present

## 2022-04-30 LAB — CBC WITH DIFFERENTIAL/PLATELET
Abs Immature Granulocytes: 0.05 10*3/uL (ref 0.00–0.07)
Abs Immature Granulocytes: 0.08 10*3/uL — ABNORMAL HIGH (ref 0.00–0.07)
Basophils Absolute: 0 10*3/uL (ref 0.0–0.1)
Basophils Absolute: 0 10*3/uL (ref 0.0–0.1)
Basophils Relative: 0 %
Basophils Relative: 0 %
Eosinophils Absolute: 0 10*3/uL (ref 0.0–0.5)
Eosinophils Absolute: 0 10*3/uL (ref 0.0–0.5)
Eosinophils Relative: 0 %
Eosinophils Relative: 0 %
HCT: 25.1 % — ABNORMAL LOW (ref 36.0–46.0)
HCT: 26.7 % — ABNORMAL LOW (ref 36.0–46.0)
Hemoglobin: 8.6 g/dL — ABNORMAL LOW (ref 12.0–15.0)
Hemoglobin: 9.1 g/dL — ABNORMAL LOW (ref 12.0–15.0)
Immature Granulocytes: 0 %
Immature Granulocytes: 1 %
Lymphocytes Relative: 11 %
Lymphocytes Relative: 6 %
Lymphs Abs: 1 10*3/uL (ref 0.7–4.0)
Lymphs Abs: 1.7 10*3/uL (ref 0.7–4.0)
MCH: 34.3 pg — ABNORMAL HIGH (ref 26.0–34.0)
MCH: 35 pg — ABNORMAL HIGH (ref 26.0–34.0)
MCHC: 34.1 g/dL (ref 30.0–36.0)
MCHC: 34.3 g/dL (ref 30.0–36.0)
MCV: 100.8 fL — ABNORMAL HIGH (ref 80.0–100.0)
MCV: 102 fL — ABNORMAL HIGH (ref 80.0–100.0)
Monocytes Absolute: 0.5 10*3/uL (ref 0.1–1.0)
Monocytes Absolute: 0.6 10*3/uL (ref 0.1–1.0)
Monocytes Relative: 4 %
Monocytes Relative: 4 %
Neutro Abs: 13.1 10*3/uL — ABNORMAL HIGH (ref 1.7–7.7)
Neutro Abs: 14.2 10*3/uL — ABNORMAL HIGH (ref 1.7–7.7)
Neutrophils Relative %: 85 %
Neutrophils Relative %: 89 %
Platelets: 112 10*3/uL — ABNORMAL LOW (ref 150–400)
Platelets: 122 10*3/uL — ABNORMAL LOW (ref 150–400)
RBC: 2.46 MIL/uL — ABNORMAL LOW (ref 3.87–5.11)
RBC: 2.65 MIL/uL — ABNORMAL LOW (ref 3.87–5.11)
WBC: 15.4 10*3/uL — ABNORMAL HIGH (ref 4.0–10.5)
WBC: 15.8 10*3/uL — ABNORMAL HIGH (ref 4.0–10.5)
nRBC: 0 % (ref 0.0–0.2)
nRBC: 0.1 % (ref 0.0–0.2)

## 2022-04-30 LAB — URINALYSIS, COMPLETE (UACMP) WITH MICROSCOPIC
Bilirubin Urine: NEGATIVE
Glucose, UA: NEGATIVE mg/dL
Ketones, ur: NEGATIVE mg/dL
Nitrite: NEGATIVE
Protein, ur: NEGATIVE mg/dL
Specific Gravity, Urine: 1.01 (ref 1.005–1.030)
pH: 5.5 (ref 5.0–8.0)

## 2022-04-30 LAB — MRSA NEXT GEN BY PCR, NASAL: MRSA by PCR Next Gen: NOT DETECTED

## 2022-04-30 LAB — GLUCOSE, CAPILLARY: Glucose-Capillary: 94 mg/dL (ref 70–99)

## 2022-04-30 LAB — PROCALCITONIN: Procalcitonin: 0.12 ng/mL

## 2022-04-30 LAB — C-REACTIVE PROTEIN: CRP: 1.7 mg/dL — ABNORMAL HIGH (ref <1.0)

## 2022-04-30 LAB — SEDIMENTATION RATE: Sed Rate: 32 mm/hr — ABNORMAL HIGH (ref 0–22)

## 2022-04-30 MED ORDER — MIDODRINE HCL 5 MG PO TABS
10.0000 mg | ORAL_TABLET | Freq: Three times a day (TID) | ORAL | Status: DC
  Administered 2022-04-30 – 2022-05-03 (×11): 10 mg via ORAL
  Filled 2022-04-30 (×12): qty 2

## 2022-04-30 MED ORDER — POTASSIUM CHLORIDE CRYS ER 20 MEQ PO TBCR
40.0000 meq | EXTENDED_RELEASE_TABLET | Freq: Every day | ORAL | Status: DC
  Administered 2022-04-30 – 2022-05-03 (×4): 40 meq via ORAL
  Filled 2022-04-30 (×4): qty 2

## 2022-04-30 MED ORDER — SPIRONOLACTONE 25 MG PO TABS
50.0000 mg | ORAL_TABLET | Freq: Every day | ORAL | Status: DC
  Administered 2022-04-30 – 2022-05-03 (×4): 50 mg via ORAL
  Filled 2022-04-30 (×5): qty 2

## 2022-04-30 MED ORDER — HEPARIN SODIUM (PORCINE) 5000 UNIT/ML IJ SOLN
5000.0000 [IU] | Freq: Three times a day (TID) | INTRAMUSCULAR | Status: DC
  Administered 2022-04-30 – 2022-05-03 (×10): 5000 [IU] via SUBCUTANEOUS
  Filled 2022-04-30 (×10): qty 1

## 2022-04-30 MED ORDER — PANTOPRAZOLE SODIUM 40 MG PO TBEC
40.0000 mg | DELAYED_RELEASE_TABLET | Freq: Two times a day (BID) | ORAL | Status: DC
  Administered 2022-04-30 – 2022-05-03 (×7): 40 mg via ORAL
  Filled 2022-04-30 (×7): qty 1

## 2022-04-30 MED ORDER — OXYCODONE HCL 5 MG PO TABS
5.0000 mg | ORAL_TABLET | ORAL | Status: DC | PRN
  Administered 2022-04-30 – 2022-05-03 (×4): 5 mg via ORAL
  Filled 2022-04-30 (×4): qty 1

## 2022-04-30 MED ORDER — ONDANSETRON HCL 4 MG/2ML IJ SOLN: 4.0000 mg | Freq: Four times a day (QID) | INTRAMUSCULAR | Status: DC | PRN

## 2022-04-30 MED ORDER — FERROUS SULFATE 325 (65 FE) MG PO TABS
325.0000 mg | ORAL_TABLET | Freq: Every day | ORAL | Status: DC
Start: 2022-05-01 — End: 2022-05-03
  Administered 2022-05-01 – 2022-05-03 (×3): 325 mg via ORAL
  Filled 2022-04-30 (×3): qty 1

## 2022-04-30 MED ORDER — DIPHENHYDRAMINE HCL 25 MG PO CAPS
25.0000 mg | ORAL_CAPSULE | Freq: Three times a day (TID) | ORAL | Status: AC
  Administered 2022-04-30 – 2022-05-01 (×4): 25 mg via ORAL
  Filled 2022-04-30 (×4): qty 1

## 2022-04-30 MED ORDER — FOLIC ACID 1 MG PO TABS
1.0000 mg | ORAL_TABLET | Freq: Every day | ORAL | Status: DC
  Administered 2022-04-30 – 2022-05-03 (×4): 1 mg via ORAL
  Filled 2022-04-30 (×4): qty 1

## 2022-04-30 MED ORDER — FLUCONAZOLE 200 MG PO TABS
200.0000 mg | ORAL_TABLET | Freq: Every day | ORAL | Status: AC
  Administered 2022-04-30 – 2022-05-02 (×3): 200 mg via ORAL
  Filled 2022-04-30 (×3): qty 1

## 2022-04-30 MED ORDER — SUCRALFATE 1 GM/10ML PO SUSP
1.0000 g | Freq: Four times a day (QID) | ORAL | Status: DC
  Administered 2022-04-30 – 2022-05-03 (×14): 1 g via ORAL
  Filled 2022-04-30 (×14): qty 10

## 2022-04-30 MED ORDER — FENTANYL CITRATE PF 50 MCG/ML IJ SOSY
50.0000 ug | PREFILLED_SYRINGE | INTRAMUSCULAR | Status: DC | PRN
  Administered 2022-04-30: 50 ug via INTRAVENOUS
  Filled 2022-04-30: qty 1

## 2022-04-30 MED ORDER — VITAMIN B-12 1000 MCG PO TABS
1000.0000 ug | ORAL_TABLET | Freq: Every day | ORAL | Status: DC
  Administered 2022-04-30 – 2022-05-03 (×4): 1000 ug via ORAL
  Filled 2022-04-30 (×4): qty 1

## 2022-04-30 MED ORDER — SODIUM CHLORIDE 0.9 % IV SOLN
100.0000 mg | Freq: Two times a day (BID) | INTRAVENOUS | Status: AC
  Administered 2022-04-30 – 2022-05-02 (×5): 100 mg via INTRAVENOUS
  Filled 2022-04-30 (×5): qty 100

## 2022-04-30 MED ORDER — ALBUTEROL SULFATE (2.5 MG/3ML) 0.083% IN NEBU: 2.5000 mg | INHALATION_SOLUTION | RESPIRATORY_TRACT | Status: DC | PRN

## 2022-04-30 MED ORDER — DIPHENHYDRAMINE-ZINC ACETATE 2-0.1 % EX CREA
TOPICAL_CREAM | Freq: Three times a day (TID) | CUTANEOUS | Status: DC
  Filled 2022-04-30 (×3): qty 28

## 2022-04-30 MED ORDER — PREDNISOLONE 5 MG PO TABS
40.0000 mg | ORAL_TABLET | Freq: Every day | ORAL | Status: DC
  Administered 2022-04-30 – 2022-05-03 (×4): 40 mg via ORAL
  Filled 2022-04-30 (×4): qty 8

## 2022-04-30 MED ORDER — FUROSEMIDE 40 MG PO TABS
60.0000 mg | ORAL_TABLET | Freq: Every day | ORAL | Status: DC
  Administered 2022-04-30: 60 mg via ORAL
  Filled 2022-04-30: qty 1

## 2022-04-30 MED ORDER — ONDANSETRON HCL 4 MG PO TABS: 4.0000 mg | ORAL_TABLET | Freq: Four times a day (QID) | ORAL | Status: DC | PRN

## 2022-04-30 MED ORDER — BUSPIRONE HCL 5 MG PO TABS: 15.0000 mg | ORAL_TABLET | Freq: Three times a day (TID) | ORAL | Status: DC | PRN

## 2022-04-30 MED ORDER — FUROSEMIDE 40 MG PO TABS
40.0000 mg | ORAL_TABLET | Freq: Every day | ORAL | Status: DC
Start: 2022-04-30 — End: 2022-04-30

## 2022-04-30 NOTE — Progress Notes (Signed)
PROGRESS NOTE                                                                                                                                                                                                             Patient Demographics:    [name], is a 46 y.o. female, DOB - April 05, 1976, [id number]  Outpatient Primary MD for the patient is Kerin Perna, NP    LOS - 0  Admit date - 04/29/2022    Chief Complaint  Patient presents with   Rash       Brief Narrative (HPI from H&P)     46 y.o. 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  Patient now presents to [facility] hours after discharged with complaint of burning rash on inner right thigh.    Subjective:    [name] today has, No headache, No chest pain, No abdominal pain - No Nausea, No new weakness tingling or numbness, no shortness of breath, right thigh rash as below.   Assessment  & Plan :    R.Thigh intense erythematous morbilliform rash -according to the patient she was fine when she was discharged in the hospital, after leaving the hospital she sat on the outside brick wall of the hospital for a few minutes waiting for her diet, she subsequently went to her house and when she was changing her closed she noticed this rash on her right thigh, she said she could have been bitten by an insect while she was sitting on the wall.  Rash has minimal warmth, does look like a insect bite with superficial  cellulitis versus superficial hematoma, discussed with radiologist obtain soft tissue CT scan of the right leg.  Too big for ultrasound per radiologist, placed on doxycycline and monitor.  Topical Benadryl cream also ordered.   Anasarca due to alcoholic cirrhosis and portal hypertension, also has CKD stage IIIa - She is in frank fluid overload hence her  diuretic regimen will be continued, continue Unna boots, edema is improving, as per last admission she will continue her prednisolone dose and finish the course, stop date 05/13/2022.  Continue fluid restriction with outpatient [facility] follow-up postdischarge.    Alcoholic hepatitis  - as above.  Her discriminant factor last admission was 32 she is finishing her steroid dose.   Acute on chronic anemia, anemia of chronic disease  -Patient underwent extensive work-up during recent admission.  She reported no frank blood loss since discharge.  EGD had shown marginal ulcer with no stigmata of acute bleeding, colonoscopy was deferred.  She also was noted to have hemorrhoids and likely some slow GI blood loss from the marginal ulcer.  Possibly bone marrow suppression from alcohol.  Patient was followed by hematology and GI.  Patient had received Aranesp on 04/21/22, Received 1 unit of packed RBC on 04/25/2022, panel inconclusive, on oral iron supplementation continue along with PPI.   PUD/marginal ulcer on recent EGD  - Continue PPI, Carafate, continue Diflucan, was recommended total 10 days last admission.  Continued medications from previous admission unchanged.  Counseled on compliance outpatient GI follow-up postdischarge.  Finishing her fluconazole dose soon.   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  Monitor with ongoing diuresis.  UA had no proteinuria, abdominal ultrasound nonacute and in by nephrology will follow outpatient, they have signed off.  Continue gentle diuresis and monitor.   Hypotension.  Stable after midodrine which will be continued.   Stage IV pressure ulcer of sacral region  -wound care consulted, this is chronic and was present on admission, is not actively infected, will order RN wound care at home upon discharge if she qualifies for it if not it will be done at PCP office.        Condition - Fair  Family Communication  :  None present  Code Status :  Full  Consults  :  None  PUD Prophylaxis : PPI   Procedures  :     CT R leg -       Disposition Plan  :    Status is: Observation  DVT Prophylaxis  :    heparin injection 5,000 Units Start: 04/30/22 0915 SCDs Start: 04/30/22 0525    Lab Results  Component Value Date   PLT 112 (L) 04/30/2022    Diet :  Diet Order             Diet 2 gram sodium Room service appropriate? Yes; Fluid consistency: Thin; Fluid restriction: 1200 mL Fluid  Diet effective now                    Inpatient Medications  Scheduled Meds:  B-12   Oral Daily   diphenhydrAMINE  25 mg Oral Q8H   diphenhydrAMINE-zinc acetate   Topical TID   [START ON 05/01/2022] ferrous sulfate  325 mg Oral Q breakfast   fluconazole  200 mg Oral Daily   folic acid  1 mg Oral Daily   furosemide  60 mg Oral Daily   heparin injection (subcutaneous)  5,000 Units Subcutaneous Q8H   midodrine  10 mg Oral  TID WC   pantoprazole  40 mg Oral BID AC   potassium chloride  40 mEq Oral Daily   prednisoLONE  40 mg Oral Daily   spironolactone  50 mg Oral Daily   sucralfate  1 g Oral Q6H   Continuous Infusions:  doxycycline (VIBRAMYCIN) IV     PRN Meds:.albuterol, busPIRone, fentaNYL (SUBLIMAZE) injection, ondansetron **OR** ondansetron (ZOFRAN)  IV, oxyCODONE  Time Spent in minutes  30   Lala Lund M.D on 04/30/2022 at 8:24 AM  To page go to [email or web address]   [facility] -  Office  [phone]  See all Orders from today for further details    Objective:   Vitals:   04/30/22 0000 04/30/22 0100 04/30/22 0400 04/30/22 0817  BP: (!) 111/59  118/60 109/63  Pulse: 72 74 98 89  Resp:   20 18  Temp:   98.8 F (37.1 C) 98.9 F (37.2 C)  TempSrc:   Oral Oral  SpO2: 98% 100% 100% 100%  Weight:      Height:        Wt Readings from Last 3 Encounters:  04/29/22 98 kg  04/22/22 100.4 kg  01/18/22 99.2 kg    No intake or output data in the 24 hours ending 04/30/22 0824   Physical Exam  Awake Alert, No new F.N deficits, Normal affect Advance.AT,PERRAL Supple Neck, No JVD,   Symmetrical Chest wall movement, Good air movement bilaterally, CTAB RRR,No Gallops,Rubs or new Murmurs,  +ve B.Sounds, Abd Soft, No tenderness, 2+ edema   R thigh ++ large morbilliform erythematous rash, minimal warmth      Data Review:    CBC Recent Labs  Lab 04/27/22 0450 04/28/22 0413 04/29/22 0306 04/29/22 2346 04/30/22 0545  WBC 14.0* 13.9* 14.4* 15.8* 15.4*  HGB 8.1* 8.1* 8.3* 9.1* 8.6*  HCT 23.2* 23.4* 24.7* 26.7* 25.1*  PLT 97* 111* 108* 122* 112*  MCV 101.3* 101.3* 102.9* 100.8* 102.0*  MCH 35.4* 35.1* 34.6* 34.3* 35.0*  MCHC 34.9 34.6 33.6 34.1 34.3  RDW Not Measured Not Measured Not Measured Not Measured Not Measured  LYMPHSABS 1.3 1.0 0.6* 1.0 1.7  MONOABS 0.8 0.7 0.4 0.6 0.5  EOSABS 0.0 0.0 0.1 0.0 0.0  BASOSABS 0.0 0.0 0.0 0.0 0.0    Electrolytes Recent Labs  Lab 04/24/22 1802 04/25/22 0539 04/25/22 1627 04/26/22 0459 04/27/22 0450 04/27/22 0500 04/28/22 0413 04/29/22 0306 04/29/22 2346 04/30/22 0545  NA 137  --   --  139 137  --  135 133*  --   --   K 4.1  --   --  3.2* 4.1  --  3.3* 3.4*  --   --   CL 105  --   --  106 104  --  102 101  --   --   CO2 20*  --   --  22 22  --  23 21*  --   --    GLUCOSE 141*  --   --  142* 117*  --  130* 136*  --   --   BUN 24*  --   --  21* 26*  --  22* 21*  --   --   CREATININE 1.50*  --  1.44* 1.50* 1.69*  --  1.69* 1.63*  --   --   CALCIUM 8.8*  --   --  8.6* 7.4*  --  8.9 8.8*  --   --   AST  --  122*  --  103* 71*  --  70* 75*  --   --   ALT  --  154*  --  143* 124*  --  118* 117*  --   --   ALKPHOS  --  120  --  99 100  --  128* 138*  --   --   BILITOT  --  6.4*  --  6.5* 6.4*  --  5.1* 5.1*  --   --   ALBUMIN  --  2.4*  --  2.8* 2.9*  --  2.7* 2.8*  --   --   MG  --   --   --   --  1.3*  --  2.0 1.7  --   --   CRP  --   --   --   --   --   --   --   --   --  1.7*  PROCALCITON  --   --   --   --   --   --   --   --  0.12  --   INR  --  1.5*  --   --  1.6*  --  1.4* 1.4*  --   --   BNP  --   --   --   --   --  804.0* 818.1* 967.2*  --   --     ------------------------------------------------------------------------------------------------------------------ No results for input(s): "CHOL", "HDL", "LDLCALC", "TRIG", "CHOLHDL", "LDLDIRECT" in the last 72 hours.  Lab Results  Component Value Date   HGBA1C 3.8 (L) 04/13/2022    No results for input(s): "TSH", "T4TOTAL", "T3FREE", "THYROIDAB" in the last 72 hours.  Invalid input(s): "FREET3" ------------------------------------------------------------------------------------------------------------------ ID Labs Recent Labs  Lab 04/25/22 1627 04/26/22 0459 04/27/22 0450 04/28/22 0413 04/29/22 0306 04/29/22 2346 04/30/22 0545  WBC  --  13.2* 14.0* 13.9* 14.4* 15.8* 15.4*  PLT  --  83* 97* 111* 108* 122* 112*  CRP  --   --   --   --   --   --  1.7*  PROCALCITON  --   --   --   --   --  0.12  --   CREATININE 1.44* 1.50* 1.69* 1.69* 1.63*  --   --    Cardiac Enzymes No results for input(s): "CKMB", "TROPONINI", "MYOGLOBIN" in the last 168 hours.  Invalid input(s): "CK"   Micro Results No results found for this or any previous visit (from the past 240 hour(s)).  Radiology  Reports DG Ankle Complete Left  Result Date: 04/28/2022 CLINICAL DATA:  Increased swelling and pain today. EXAM: LEFT ANKLE COMPLETE - 3+ VIEW COMPARISON:  None Available. FINDINGS: There is overlying material that may represent casting material versus a compression stocking. The ankle mortise is symmetric and intact. Mild distal anterior tibial plafond and mild adjacent dorsal talar head-neck junction degenerative osteophytosis on lateral view. Small plantar calcaneal heel spur. Minimal distal medial malleolar degenerative osteophytosis. No acute fracture or dislocation. IMPRESSION: Mild anterior tibiotalar osteoarthritis. Electronically Signed   By: Neita Garnet M.D.   On: 04/28/2022 08:53   US Abdomen Complete  Result Date: 04/26/2022 CLINICAL DATA:  Cirrhosis EXAM: ABDOMEN ULTRASOUND COMPLETE COMPARISON:  None Available. FINDINGS: Gallbladder: Surgically absent. Common bile duct: Diameter: 4.8 mm Liver: No focal lesion identified. Slightly nodular liver contour with increased parenchymal echogenicity. Portal vein is patent on color Doppler imaging with normal direction of blood flow towards the liver. IVC: No abnormality visualized. Pancreas: Not visualized due to overlying bowel gas. Spleen: Size and  appearance within normal limits. Right Kidney: Length: 14.2. Echogenicity within normal limits. No mass or hydronephrosis visualized. Left Kidney: Length: 13.2. Echogenicity within normal limits. No mass or hydronephrosis visualized. Abdominal aorta: No aneurysm visualized. Other findings: Trace ascites seen about the liver. IMPRESSION: 1. Hepatic steatosis. Mildly nodular liver contour, concerning for cirrhosis. 2. Trace perihepatic ascites. Electronically Signed   By: Allegra Lai M.D.   On: 04/26/2022 11:35

## 2022-04-30 NOTE — H&P (Signed)
History and Physical    [name] [id number] DOB: [birth year] DOA: 04/29/2022  PCP: Kerin Perna, NP  Patient coming from: [facility] have personally briefly reviewed patient's old medical records in [place]  Chief Complaint: rash  HPI: [name] is a 46 y.o. female with medical history significant of bariatric surgery, hypertension, alcoholic hepatitis, chronic anemia partly related to slow gi blood loss, PUD/Marginal ulcer,State iv pressure ulcer/sacrum, esophageal candidiasis on fluconazole, who was recently discharged on 8/5 with dx of  Fluid overload due to decompensated ETOH  cirrhosis with portal HTN  as well as aki on CKDIII3b,treated with lasix and aldactone as well as continued on prednisolone started prior to admission with stop date of 05/13/22.  Patient now presents to [facility] hours after discharged with complaint of burning rash on inner right thigh.  Per patient no trauma no bug bites noted. Patient noted no associated fever , chills, sob/ chest pain. She notes rash localized to right leg. Rash unchanged since admission. Noted still with burning and very tender.  ED Course:  Vitals:afeb, bp 118/60, rr 20 hr 98 sat 100% on ra Labs: Wbc 15.8 ( 14.4)  hgb 9.1 (8.3) mcv 100.8 plt 122 (108) Procal 0.12   Review of Systems: As per HPI otherwise 10 point review of systems negative.   Past Medical History:  Diagnosis Date   Acid reflux    Anemia    Ulcer     Past Surgical History:  Procedure Laterality Date   ABDOMINAL SURGERY     abdominoplasty   BIOPSY  04/18/2022   Procedure: BIOPSY;  Surgeon: Jackquline Denmark, MD;  Location: [facility] ENDOSCOPY;  Service: Gastroenterology;;   BREAST BIOPSY Right 2013   CHOLECYSTECTOMY N/A 11/23/2019   Procedure: LAPAROSCOPIC CHOLECYSTECTOMY WITH INTRAOPERATIVE CHOLANGIOGRAM;  Surgeon: Mickeal Skinner, MD;  Location: [facility];  Service: General;  Laterality: N/A;   COSMETIC SURGERY     tummy tuck    ESOPHAGOGASTRODUODENOSCOPY (EGD) WITH PROPOFOL N/A 04/18/2022   Procedure: ESOPHAGOGASTRODUODENOSCOPY (EGD) WITH PROPOFOL;  Surgeon: Jackquline Denmark, MD;  Location: [facility];  Service: Gastroenterology;  Laterality: N/A;   HERNIA REPAIR     ROUX-EN-Y PROCEDURE     TUMOR REMOVAL     WRIST GANGLION EXCISION       reports that she has been smoking cigarettes. She has a 6.25 pack-year smoking history. She has never used smokeless tobacco. She reports that she does not currently use alcohol after a past usage of about 12.0 standard drinks of alcohol per week. She reports that she does not use drugs.  Allergies  Allergen Reactions   Tape Rash    Clear tape    Family History  Problem Relation Age of Onset   Cancer Mother        kidney   Cancer Paternal Aunt    Breast cancer Maternal Aunt    Pancreatic cancer Maternal Aunt    Lung cancer Paternal Uncle     Prior to Admission medications   Medication Sig Start Date End Date Taking? Authorizing Provider  busPIRone (BUSPAR) 15 MG tablet Take 15 mg by mouth 3 (three) times daily as needed (anxiety). 06/09/21  Yes [provider]  Cyanocobalamin (B-12 PO) Take 1 tablet by mouth daily.   Yes [provider]  ferrous sulfate 325 (65 FE) MG tablet Take 1 tablet (325 mg total) by mouth daily with breakfast. 04/23/22  Yes Danford, Suann Larry, MD  fluconazole (DIFLUCAN) 200 MG  tablet Take 1 tablet (200 mg total) by mouth daily. 04/22/22  Yes Danford, Earl Lites, MD  folic acid (FOLVITE) 1 MG tablet Take 1 tablet (1 mg total) by mouth daily. 04/22/22  Yes Danford, Earl Lites, MD  furosemide (LASIX) 40 MG tablet Take 1 tablet (40 mg total) by mouth daily for 15 days. 04/29/22 05/14/22 Yes Leroy Sea, MD  loperamide (IMODIUM) 2 MG capsule Take 1 capsule (2 mg total) by mouth as needed for diarrhea or loose stools. 04/22/22  Yes Danford, Earl Lites, MD  midodrine (PROAMATINE) 10 MG tablet Take 1 tablet (10 mg total) by mouth  3 (three) times daily with meals. 04/29/22  Yes Leroy Sea, MD  pantoprazole (PROTONIX) 40 MG tablet Take 1 tablet (40 mg total) by mouth 2 (two) times daily before a meal. 04/22/22  Yes Danford, Earl Lites, MD  potassium chloride (KLOR-CON) 10 MEQ tablet Take 1 tablet (10 mEq total) by mouth daily. 04/29/22  Yes Leroy Sea, MD  prednisoLONE 5 MG TABS tablet Take 8 tablets (40 mg total) by mouth daily for 19 days. 04/22/22 05/11/22 Yes Danford, Earl Lites, MD  spironolactone (ALDACTONE) 50 MG tablet Take 1 tablet (50 mg total) by mouth daily. 04/29/22  Yes Leroy Sea, MD  sucralfate (CARAFATE) 1 GM/10ML suspension Take 10 mLs (1 g total) by mouth every 6 (six) hours. 04/22/22  Yes Alberteen Sam, MD    Physical Exam: Vitals:   04/29/22 2356 04/30/22 0000 04/30/22 0100 04/30/22 0400  BP:  (!) 111/59  118/60  Pulse:  72 74 98  Resp:    20  Temp: 98.6 F (37 C)   98.8 F (37.1 C)  TempSrc: Oral   Oral  SpO2:  98% 100% 100%  Weight:      Height:        Vitals:   04/29/22 2356 04/30/22 0000 04/30/22 0100 04/30/22 0400  BP:  (!) 111/59  118/60  Pulse:  72 74 98  Resp:    20  Temp: 98.6 F (37 C)   98.8 F (37.1 C)  TempSrc: Oral   Oral  SpO2:  98% 100% 100%  Weight:      Height:       Constitutional: NAD, calm, comfortable Eyes: PERRL, lids and conjunctivae normal ENMT: Mucous membranes are moist. Posterior pharynx clear of any exudate or lesions.Normal dentition.  Neck: normal, supple, no masses, no thyromegaly Respiratory: clear to auscultation bilaterally, no wheezing, no crackles. Normal respiratory effort. No accessory muscle use.  Cardiovascular: Regular rate and rhythm, no murmurs / rubs / gallops. +extremity edema. 2+ pedal pulses.  Abdomen: no tenderness, no masses palpated. No hepatosplenomegaly. Bowel sounds positive.  Musculoskeletal: no clubbing / cyanosis. No joint deformity upper and lower extremities. Good ROM, no contractures. Normal  muscle tone.  Skin: rash right inner thigh erythematous, tender not pass border line made in ed Neurologic: CN 2-12 grossly intact. Sensation intact, Strength 5/5 in all 4.  Psychiatric: Normal judgment and insight. Alert and oriented x 3. Normal mood.    Labs on Admission: I have personally reviewed following labs and imaging studies  CBC: Recent Labs  Lab 04/25/22 0539 04/26/22 0459 04/27/22 0450 04/28/22 0413 04/29/22 0306 04/29/22 2346  WBC 18.6* 13.2* 14.0* 13.9* 14.4* 15.8*  NEUTROABS 15.7*  --  11.9* 12.2* 13.2* 14.2*  HGB 7.3* 8.2* 8.1* 8.1* 8.3* 9.1*  HCT 21.9* 24.4* 23.2* 23.4* 24.7* 26.7*  MCV 107.4* 103.0* 101.3* 101.3* 102.9* 100.8*  PLT 86* 83* 97* 111* 108* 122*   Basic Metabolic Panel: Recent Labs  Lab 04/24/22 1802 04/25/22 1627 04/26/22 0459 04/27/22 0450 04/28/22 0413 04/29/22 0306  NA 137  --  139 137 135 133*  K 4.1  --  3.2* 4.1 3.3* 3.4*  CL 105  --  106 104 102 101  CO2 20*  --  22 22 23  21*  GLUCOSE 141*  --  142* 117* 130* 136*  BUN 24*  --  21* 26* 22* 21*  CREATININE 1.50* 1.44* 1.50* 1.69* 1.69* 1.63*  CALCIUM 8.8*  --  8.6* 7.4* 8.9 8.8*  MG  --   --   --  1.3* 2.0 1.7   GFR: Estimated Creatinine Clearance: 52 mL/min (A) (by C-G formula based on SCr of 1.63 mg/dL (H)). Liver Function Tests: Recent Labs  Lab 04/25/22 0539 04/26/22 0459 04/27/22 0450 04/28/22 0413 04/29/22 0306  AST 122* 103* 71* 70* 75*  ALT 154* 143* 124* 118* 117*  ALKPHOS 120 99 100 128* 138*  BILITOT 6.4* 6.5* 6.4* 5.1* 5.1*  PROT 6.3* 6.2* 6.0* 6.0* 6.0*  ALBUMIN 2.4* 2.8* 2.9* 2.7* 2.8*   No results for input(s): "LIPASE", "AMYLASE" in the last 168 hours. No results for input(s): "AMMONIA" in the last 168 hours. Coagulation Profile: Recent Labs  Lab 04/25/22 0539 04/27/22 0450 04/28/22 0413 04/29/22 0306  INR 1.5* 1.6* 1.4* 1.4*   Cardiac Enzymes: No results for input(s): "CKTOTAL", "CKMB", "CKMBINDEX", "TROPONINI" in the last 168 hours. BNP  (last 3 results) No results for input(s): "PROBNP" in the last 8760 hours. HbA1C: No results for input(s): "HGBA1C" in the last 72 hours. CBG: No results for input(s): "GLUCAP" in the last 168 hours. Lipid Profile: No results for input(s): "CHOL", "HDL", "LDLCALC", "TRIG", "CHOLHDL", "LDLDIRECT" in the last 72 hours. Thyroid Function Tests: No results for input(s): "TSH", "T4TOTAL", "FREET4", "T3FREE", "THYROIDAB" in the last 72 hours. Anemia Panel: No results for input(s): "VITAMINB12", "FOLATE", "FERRITIN", "TIBC", "IRON", "RETICCTPCT" in the last 72 hours. Urine analysis:    Component Value Date/Time   COLORURINE AMBER (A) 04/11/2022 0801   APPEARANCEUR CLEAR 04/11/2022 0801   LABSPEC 1.011 04/11/2022 0801   PHURINE 6.0 04/11/2022 0801   GLUCOSEU NEGATIVE 04/11/2022 0801   HGBUR NEGATIVE 04/11/2022 0801   BILIRUBINUR NEGATIVE 04/11/2022 0801   KETONESUR NEGATIVE 04/11/2022 0801   PROTEINUR NEGATIVE 04/11/2022 0801   UROBILINOGEN 0.2 01/09/2013 2111   NITRITE NEGATIVE 04/11/2022 0801   LEUKOCYTESUR NEGATIVE 04/11/2022 0801    Radiological Exams on Admission: DG Ankle Complete Left  Result Date: 04/28/2022 CLINICAL DATA:  Increased swelling and pain today. EXAM: LEFT ANKLE COMPLETE - 3+ VIEW COMPARISON:  None Available. FINDINGS: There is overlying material that may represent casting material versus a compression stocking. The ankle mortise is symmetric and intact. Mild distal anterior tibial plafond and mild adjacent dorsal talar head-neck junction degenerative osteophytosis on lateral view. Small plantar calcaneal heel spur. Minimal distal medial malleolar degenerative osteophytosis. No acute fracture or dislocation. IMPRESSION: Mild anterior tibiotalar osteoarthritis. Electronically Signed   By: Neita Garnet M.D.   On: 04/28/2022 08:53    EKG: Independently reviewed. N/a  Assessment/Plan  Erythematous Rash Localized to right inner thigh  -r/o /infectious  etiology/vasculitis/ fixed drug reaction -patient with stable h/h  -rash burning sensation  -u/s of right lower extremity  -repeat inflammatory markers  -monitor h/h  -supportive care with antihistamine -hold on abx for now, no progression of rash w/o treatment no fever -consider dermatology  consult for am   ETOH cirrhosis/portal htn -recent admission for decompensation with fluid overload -continue on lasix /aldactone/prednisolone   Anemia  -multifactorial / acd/element of gi loss -stable  -followed by hematology and GI -s/p Aransep on 7/28, s/p 1 unit PRBC 8/1  Hx of PUD/marginal ulcer  -continue ppi ,carafate  Esophageal Candidiasis -continue diflucan   Chronic thrombocytopenia -stable   Hypotension  -continue midodrine  CKDIIIb  -recovering form AKI -continue to monitor  -prior base was 1.5  Stage IV pressure ulcer  -wound care to see   FEN Monitor electrolytes replete prn   DVT prophylaxis: Scd  Code Status: full Family Communication: none at bedside Disposition Plan: patient  expected to be admitted less than 2 midnights  Consults called: n/a Admission status: obs med tele   Lurline Del MD [facility]   If 7PM-7AM, please contact night-coverage [email or web address] Password [facility]  04/30/2022, 4:39 AM

## 2022-05-01 LAB — COMPREHENSIVE METABOLIC PANEL
ALT: 96 U/L — ABNORMAL HIGH (ref 0–44)
AST: 46 U/L — ABNORMAL HIGH (ref 15–41)
Albumin: 2.9 g/dL — ABNORMAL LOW (ref 3.5–5.0)
Alkaline Phosphatase: 156 U/L — ABNORMAL HIGH (ref 38–126)
Anion gap: 9 (ref 5–15)
BUN: 22 mg/dL — ABNORMAL HIGH (ref 6–20)
CO2: 23 mmol/L (ref 22–32)
Calcium: 9.4 mg/dL (ref 8.9–10.3)
Chloride: 103 mmol/L (ref 98–111)
Creatinine, Ser: 1.82 mg/dL — ABNORMAL HIGH (ref 0.44–1.00)
GFR, Estimated: 35 mL/min — ABNORMAL LOW (ref >60)
Glucose, Bld: 103 mg/dL — ABNORMAL HIGH (ref 70–99)
Potassium: 4.3 mmol/L (ref 3.5–5.1)
Sodium: 135 mmol/L (ref 135–145)
Total Bilirubin: 6.3 mg/dL — ABNORMAL HIGH (ref 0.3–1.2)
Total Protein: 6.6 g/dL (ref 6.5–8.1)

## 2022-05-01 LAB — CBC WITH DIFFERENTIAL/PLATELET
Abs Immature Granulocytes: 0.08 10*3/uL — ABNORMAL HIGH (ref 0.00–0.07)
Basophils Absolute: 0 10*3/uL (ref 0.0–0.1)
Basophils Relative: 0 %
Eosinophils Absolute: 0 10*3/uL (ref 0.0–0.5)
Eosinophils Relative: 0 %
HCT: 27.6 % — ABNORMAL LOW (ref 36.0–46.0)
Hemoglobin: 9.2 g/dL — ABNORMAL LOW (ref 12.0–15.0)
Immature Granulocytes: 0 %
Lymphocytes Relative: 9 %
Lymphs Abs: 1.6 10*3/uL (ref 0.7–4.0)
MCH: 34.5 pg — ABNORMAL HIGH (ref 26.0–34.0)
MCHC: 33.3 g/dL (ref 30.0–36.0)
MCV: 103.4 fL — ABNORMAL HIGH (ref 80.0–100.0)
Monocytes Absolute: 0.9 10*3/uL (ref 0.1–1.0)
Monocytes Relative: 5 %
Neutro Abs: 15.4 10*3/uL — ABNORMAL HIGH (ref 1.7–7.7)
Neutrophils Relative %: 86 %
Platelets: 122 10*3/uL — ABNORMAL LOW (ref 150–400)
RBC: 2.67 MIL/uL — ABNORMAL LOW (ref 3.87–5.11)
WBC: 17.9 10*3/uL — ABNORMAL HIGH (ref 4.0–10.5)
nRBC: 0 % (ref 0.0–0.2)

## 2022-05-01 LAB — PROTIME-INR
INR: 1.5 — ABNORMAL HIGH (ref 0.8–1.2)
Prothrombin Time: 17.7 seconds — ABNORMAL HIGH (ref 11.4–15.2)

## 2022-05-01 LAB — MAGNESIUM: Magnesium: 1.6 mg/dL — ABNORMAL LOW (ref 1.7–2.4)

## 2022-05-01 LAB — C-REACTIVE PROTEIN: CRP: 11.5 mg/dL — ABNORMAL HIGH (ref <1.0)

## 2022-05-01 MED ORDER — MAGNESIUM SULFATE 4 GM/100ML IV SOLN
4.0000 g | Freq: Once | INTRAVENOUS | Status: AC
  Administered 2022-05-01: 4 g via INTRAVENOUS
  Filled 2022-05-01: qty 100

## 2022-05-01 MED ORDER — FUROSEMIDE 40 MG PO TABS
40.0000 mg | ORAL_TABLET | Freq: Two times a day (BID) | ORAL | Status: DC
  Administered 2022-05-01 – 2022-05-03 (×4): 40 mg via ORAL
  Filled 2022-05-01 (×4): qty 1

## 2022-05-01 MED ORDER — ALBUMIN HUMAN 25 % IV SOLN
12.5000 g | Freq: Once | INTRAVENOUS | Status: AC
Start: 2022-05-01 — End: 2022-05-01
  Administered 2022-05-01: 12.5 g via INTRAVENOUS
  Filled 2022-05-01: qty 50

## 2022-05-01 NOTE — Progress Notes (Signed)
PROGRESS NOTE                                                                                                                                                                                                             Patient Demographics:    [name], is a 46 y.o. female, DOB - [birth year], ZOX:[id number]  Outpatient Primary MD for the patient is [name], NP    LOS - 1  Admit date - 04/29/2022    Chief Complaint  Patient presents with   Rash       Brief Narrative (HPI from H&P)     46 y.o. 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  Patient now presents to [facility] hours after discharged with complaint of burning rash on inner right thigh.    Subjective:   Patient in bed, appears comfortable, denies any headache, no fever, no chest pain or pressure, no shortness of breath , no abdominal pain. No new focal weakness.  Right thigh rash is stabilizing, no pain.   Assessment  & Plan :    R.Thigh intense erythematous morbilliform rash -according to the patient she was fine when she was discharged in the hospital, after leaving the hospital she sat on the outside brick wall of the hospital for a few minutes waiting for her diet, she subsequently went to her house and when she was changing her closed she noticed this rash on her right thigh, she said she could have been bitten by an insect while she was sitting on the wall.    Rash has minimal warmth, does look  like a insect bite with superficial cellulitis versus superficial hematoma, discussed with radiologist and we obtained a CT scan of the soft tissue on 04/30/2022, CT suggestive of possible superficial cellulitis, continue IV doxycycline, afebrile, acute on chronic leukocytosis however CRP is high, MRSA PCR is negative, clinically rash and cellulitis improving, minimal to no warmth, if any  worse we will switch her to vancomycin and Zosyn.   Anasarca due to alcoholic cirrhosis and portal hypertension, also has CKD stage IIIa - She is in frank fluid overload hence her diuretic regimen will be continued, continue Unna boots, edema is improving, as per last admission she will continue her prednisolone dose and finish the course, stop date 05/13/2022.  Continue fluid restriction with outpatient [facility] follow-up postdischarge.    Alcoholic hepatitis  - as above.  Her discriminant factor last admission was 32 she is finishing her steroid dose.   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  Continue midodrine for blood pressure support.  Recent renal ultrasound and UA were nonacute.   Hypotension.  Stable on midodrine which will be continued.  Acute on chronic anemia, anemia of chronic disease  -Patient underwent extensive work-up during recent admission.  She reported no frank blood loss since discharge.  EGD had shown marginal ulcer with no stigmata of acute bleeding, colonoscopy was deferred.  She also was noted to have hemorrhoids and likely some slow GI blood loss from the marginal ulcer.  Possibly bone marrow suppression from alcohol.  Patient was followed by hematology and GI.  Patient had  received Aranesp on 04/21/22, Received 1 unit of packed RBC on 04/25/2022, panel inconclusive, on oral iron supplementation continue along with PPI.   PUD/marginal ulcer on recent EGD  - Continue PPI, Carafate, continue Diflucan, was recommended total 10 days last admission.  Continued medications from previous admission unchanged.  Counseled on compliance outpatient GI follow-up postdischarge.  Finishing her fluconazole dose soon.   Chronic thrombocytopenia  -Likely due to bone marrow suppression, alcoholic liver disease, monitor no signs of active bleeding   Stage IV pressure ulcer of sacral region  -wound care consulted, this is chronic and was present on admission, is not actively infected, will order RN wound care at home upon discharge if she qualifies for it if not it will be done at PCP office.        Condition - Fair  Family Communication  :  None present  Code Status :  Full  Consults  :  None  PUD Prophylaxis : PPI   Procedures  :     CT R leg - 1. Generalized subcutaneous edema in the right lower extremity with more focal components anterolaterally in the proximal to mid thigh and medially in the distal lower leg. This edema is nonspecific but could reflect soft tissue infection (cellulitis). No focal fluid collection or high density components identified to suggest hematoma. 2. No muscular abnormalities are identified. 3. No joint effusions or acute osseous findings. 4. 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 Probable osteonecrosis of the distal medial femoral condyle.      Disposition Plan  :    Status is: Observation  DVT Prophylaxis  :    heparin injection 5,000 Units Start: 04/30/22 0915 SCDs Start: 04/30/22 0525    Lab Results  Component Value Date   PLT 122 (L) 05/01/2022    Diet :  Diet Order             Diet 2 gram sodium Room service appropriate? Yes; Fluid consistency: Thin;  Fluid restriction: 1200 mL Fluid  Diet  effective now                    Inpatient Medications  Scheduled Meds:  cyanocobalamin  1,000 mcg Oral Daily   diphenhydrAMINE-zinc acetate   Topical TID   ferrous sulfate  325 mg Oral Q breakfast   fluconazole  200 mg Oral Daily   folic acid  1 mg Oral Daily   furosemide  40 mg Oral BID   heparin injection (subcutaneous)  5,000 Units Subcutaneous Q8H   midodrine  10 mg Oral TID WC   pantoprazole  40 mg Oral BID AC   potassium chloride  40 mEq Oral Daily   prednisoLONE  40 mg Oral Daily   spironolactone  50 mg Oral Daily   sucralfate  1 g Oral Q6H   Continuous Infusions:  doxycycline (VIBRAMYCIN) IV 100 mg (04/30/22 2140)   magnesium sulfate bolus IVPB     PRN Meds:.albuterol, busPIRone, fentaNYL (SUBLIMAZE) injection, ondansetron **OR** ondansetron (ZOFRAN) IV, oxyCODONE  Time Spent in minutes  30   Lala Lund M.D on 05/01/2022 at 8:50 AM  To page go to [email or web address]   [facility] -  Office  [phone]  See all Orders from today for further details    Objective:   Vitals:   04/30/22 0817 04/30/22 2010 05/01/22 0213 05/01/22 0842  BP: 109/63 (!) 108/50  (!) 105/51  Pulse: 89 (!) 56  73  Resp: 18 15  16   Temp: 98.9 F (37.2 C) 98.1 F (36.7 C)  98.3 F (36.8 C)  TempSrc: Oral Oral  Oral  SpO2: 100% 98%  100%  Weight:   99.7 kg   Height:        Wt Readings from Last 3 Encounters:  05/01/22 99.7 kg  04/22/22 100.4 kg  01/18/22 99.2 kg     Intake/Output Summary (Last 24 hours) at 05/01/2022 0850 Last data filed at 04/30/2022 2140 Gross per 24 hour  Intake 647.64 ml  Output 1 ml  Net 646.64 ml     Physical Exam  Awake Alert, No new F.N deficits, Normal affect [place].AT,PERRAL Supple Neck, No JVD,   Symmetrical Chest wall movement, Good air movement bilaterally, CTAB RRR,No Gallops, Rubs or new Murmurs,  +ve B.Sounds, Abd Soft, No tenderness,    2 - 3+ edema   R thigh ++ largeerythematous rash, minimal warmth      Data Review:     CBC Recent Labs  Lab 04/28/22 0413 04/29/22 0306 04/29/22 2346 04/30/22 0545 05/01/22 0643  WBC 13.9* 14.4* 15.8* 15.4* 17.9*  HGB 8.1* 8.3* 9.1* 8.6* 9.2*  HCT 23.4* 24.7* 26.7* 25.1* 27.6*  PLT 111* 108* 122* 112* 122*  MCV 101.3* 102.9* 100.8* 102.0* 103.4*  MCH 35.1* 34.6* 34.3* 35.0* 34.5*  MCHC 34.6 33.6 34.1 34.3 33.3  RDW Not Measured Not Measured Not Measured Not Measured Not Measured  LYMPHSABS 1.0 0.6* 1.0 1.7 1.6  MONOABS 0.7 0.4 0.6 0.5 0.9  EOSABS 0.0 0.1 0.0 0.0 0.0  BASOSABS 0.0 0.0 0.0 0.0 0.0    Electrolytes Recent Labs  Lab 04/25/22 0539 04/25/22 1627 04/26/22 0459 04/27/22 0450 04/27/22 0500 04/28/22 0413 04/29/22 0306 04/29/22 2346 04/30/22 0545 05/01/22 0643  NA  --   --  139 137  --  135 133*  --   --  135  K  --   --  3.2* 4.1  --  3.3* 3.4*  --   --  4.3  CL  --   --  106 104  --  102 101  --   --  103  CO2  --   --  22 22  --  23 21*  --   --  23  GLUCOSE  --   --  142* 117*  --  130* 136*  --   --  103*  BUN  --   --  21* 26*  --  22* 21*  --   --  22*  CREATININE  --    < > 1.50* 1.69*  --  1.69* 1.63*  --   --  1.82*  CALCIUM  --   --  8.6* 7.4*  --  8.9 8.8*  --   --  9.4  AST 122*  --  103* 71*  --  70* 75*  --   --  46*  ALT 154*  --  143* 124*  --  118* 117*  --   --  96*  ALKPHOS 120  --  99 100  --  128* 138*  --   --  156*  BILITOT 6.4*  --  6.5* 6.4*  --  5.1* 5.1*  --   --  6.3*  ALBUMIN 2.4*  --  2.8* 2.9*  --  2.7* 2.8*  --   --  2.9*  MG  --   --   --  1.3*  --  2.0 1.7  --   --  1.6*  CRP  --   --   --   --   --   --   --   --  1.7* 11.5*  PROCALCITON  --   --   --   --   --   --   --  0.12  --   --   INR 1.5*  --   --  1.6*  --  1.4* 1.4*  --   --  1.5*  BNP  --   --   --   --  804.0* 818.1* 967.2*  --   --   --    < > = values in this interval not displayed.    Micro Results Recent Results (from the past 240 hour(s))  MRSA Next Gen by PCR, Nasal     Status: None   Collection Time: 04/30/22 10:18 AM    Specimen: Nasal Mucosa; Nasal Swab  Result Value Ref Range Status   MRSA by PCR Next Gen NOT DETECTED NOT DETECTED Final    Comment: (NOTE) The GeneXpert MRSA Assay (FDA approved for NASAL specimens only), is one component of a comprehensive MRSA colonization surveillance program. It is not intended to diagnose MRSA infection nor to guide or monitor treatment for MRSA infections. Test performance is not FDA approved in patients less than 52 years old. Performed at [facility], [place] [address] [place], [place] [address]    Radiology Reports CT EXTREMITY LOWER RIGHT WO CONTRAST  Result Date: 04/30/2022 CLINICAL DATA:  Right thigh rash with erythema and warmth. Possible insect bite. Evaluate for hematoma versus cellulitis. EXAM: CT OF THE LOWER RIGHT EXTREMITY WITHOUT CONTRAST TECHNIQUE: Multidetector CT imaging of the right lower extremity was performed according to the standard protocol. The images extend from the hip into the foot. RADIATION DOSE REDUCTION: This exam was performed according to the departmental dose-optimization program which includes automated exposure control, adjustment of the mA and/or kV according to patient size and/or use of iterative reconstruction technique.  COMPARISON:  Ankle radiographs 04/28/2022. Knee radiographs 11/22/2014. Pelvic CT 04/10/2022. FINDINGS: Bones/Joint/Cartilage No evidence of acute fracture or dislocation. There is chronic sclerosis of the right femoral head, suspicious for chronic femoral head osteonecrosis. No subchondral collapse or significant joint effusion. There are mild degenerative changes at the right hip. There is also mild sclerosis within the distal medial femoral condyle, suspicious for osteonecrosis without subchondral collapse. No significant knee joint effusion. There is spurring along the medial femoral epicondyle, likely due to remote MCL injury. No significant osseous findings identified within the lower leg or ankle. No  significant ankle joint effusion. Ligaments Suboptimally assessed by CT. The cruciate ligaments appear grossly intact at the knee. Muscles and Tendons No focal muscular abnormalities are identified within the right thigh or lower leg. The extensor mechanism is intact at the knee. The visualized ankle tendons are intact. Soft tissues There is generalized subcutaneous edema within both visualized lower extremities. On the right, this appears most pronounced anterolaterally in the proximal to mid thigh. The edema is also more prominent in the medial aspect of the distal lower leg. No focal fluid collections are identified. There are no high-density components to suggest hematoma. Small subcutaneous calcifications in the distal lower leg. No evidence of foreign body or soft tissue emphysema. IMPRESSION: 1. Generalized subcutaneous edema in the right lower extremity with more focal components anterolaterally in the proximal to mid thigh and medially in the distal lower leg. This edema is nonspecific but could reflect soft tissue infection (cellulitis). No focal fluid collection or high density components identified to suggest hematoma. 2. No muscular abnormalities are identified. 3. No joint effusions or acute osseous findings. 4. 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 Probable osteonecrosis of the distal medial femoral condyle. Electronically Signed   By: Carey Bullocks M.D.   On: 04/30/2022 09:57   CT EXTREMITY LOWER RIGHT WO CONTRAST  Result Date: 04/30/2022 CLINICAL DATA:  Right thigh rash with erythema and warmth. Possible insect bite. Evaluate for hematoma versus cellulitis. EXAM: CT OF THE LOWER RIGHT EXTREMITY WITHOUT CONTRAST TECHNIQUE: Multidetector CT imaging of the right lower extremity was performed according to the standard protocol. The images extend from the hip into the foot. RADIATION DOSE REDUCTION: This exam was performed according to the departmental  dose-optimization program which includes automated exposure control, adjustment of the mA and/or kV according to patient size and/or use of iterative reconstruction technique. COMPARISON:  Ankle radiographs 04/28/2022. Knee radiographs 11/22/2014. Pelvic CT 04/10/2022. FINDINGS: Bones/Joint/Cartilage No evidence of acute fracture or dislocation. There is chronic sclerosis of the right femoral head, suspicious for chronic femoral head osteonecrosis. No subchondral collapse or significant joint effusion. There are mild degenerative changes at the right hip. There is also mild sclerosis within the distal medial femoral condyle, suspicious for osteonecrosis without subchondral collapse. No significant knee joint effusion. There is spurring along the medial femoral epicondyle, likely due to remote MCL injury. No significant osseous findings identified within the lower leg or ankle. No significant ankle joint effusion. Ligaments Suboptimally assessed by CT. The cruciate ligaments appear grossly intact at the knee. Muscles and Tendons No focal muscular abnormalities are identified within the right thigh or lower leg. The extensor mechanism is intact at the knee. The visualized ankle tendons are intact. Soft tissues There is generalized subcutaneous edema within both visualized lower extremities. On the right, this appears most pronounced anterolaterally in the proximal to mid thigh. The edema is also more prominent in the medial aspect of the  distal lower leg. No focal fluid collections are identified. There are no high-density components to suggest hematoma. Small subcutaneous calcifications in the distal lower leg. No evidence of foreign body or soft tissue emphysema. IMPRESSION: 1. Generalized subcutaneous edema in the right lower extremity with more focal components anterolaterally in the proximal to mid thigh and medially in the distal lower leg. This edema is nonspecific but could reflect soft tissue infection  (cellulitis). No focal fluid collection or high density components identified to suggest hematoma. 2. No muscular abnormalities are identified. 3. No joint effusions or acute osseous findings. 4. 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 Probable osteonecrosis of the distal medial femoral condyle. Electronically Signed   By: Carey Bullocks M.D.   On: 04/30/2022 09:57   DG Ankle Complete Left  Result Date: 04/28/2022 CLINICAL DATA:  Increased swelling and pain today. EXAM: LEFT ANKLE COMPLETE - 3+ VIEW COMPARISON:  None Available. FINDINGS: There is overlying material that may represent casting material versus a compression stocking. The ankle mortise is symmetric and intact. Mild distal anterior tibial plafond and mild adjacent dorsal talar head-neck junction degenerative osteophytosis on lateral view. Small plantar calcaneal heel spur. Minimal distal medial malleolar degenerative osteophytosis. No acute fracture or dislocation. IMPRESSION: Mild anterior tibiotalar osteoarthritis. Electronically Signed   By: Neita Garnet M.D.   On: 04/28/2022 08:53

## 2022-05-01 NOTE — Consult Note (Signed)
WOC Nurse Consult Note: Patient receiving care in [facility] 2W12. Reason for Consult: "sacral decubitus wound" Wound type: UNCLEAR etiology of this deep upper buttock/intergluteal cleft wound. See former WOC & surgical notes for details. Of importance: the wound assessment was performed today while the patient was standing and leaned over the bed. It is very difficult to separate the buttocks, hold the area open, measure and assess the site with less than 2 people.  Pressure Injury POA: Yes/No/NA Measurement: The size of the wound varies depending on how much manipulation of the area is done to stretch the tissue and hold the area open. Today it is roughly 6 cm x 6 cm x 3 cm Wound bed: 100% pink Drainage (amount, consistency, odor) none Periwound: intact Dressing procedure/placement/frequency: 1. Using a 90ml syringe, wash the left upper buttock wound with saline. 2. Place Aquacel [place] [phone]) into the wound, including any undermining. 3. Cover the wound area with dry gauze. 4. Use mesh underwear to hold the dressing in place.   I have requested the [place] order 4 Aquacel dressings for the patient.  Monitor the wound area(s) for worsening of condition such as: Signs/symptoms of infection,  Increase in size,  Development of or worsening of odor, Development of pain, or increased pain at the affected locations.  Notify the medical team if any of these develop.  Thank you for the consult.  Discussed plan of care with the patient and bedside nurse.  WOC nurse will not follow at this time.  Please re-consult the WOC team if needed.  Helmut Muster, RN, MSN, CWOCN, CNS-BC, pager [phone]

## 2022-05-01 NOTE — Progress Notes (Signed)
Patient ID: RUBA SOBOTTA, female   DOB: [birth year], 46 y.o.   MRN: [id number] S: We were asked to see Ms. Tinkham again after we signed off on 04/29/22.  Please refer to original consult note from Dr. Augustin Coupe dated 04/28/22.  She was discharged on 04/29/22 only to return the next day with right leg pain and rash/cellulitis.  We were re-consulted due to a rising Scr since readmission.  She has been maintained on furosemide 40 mg bid due to her persistent lower extremity edema. O:BP (!) 105/51 (BP Location: Right Arm)   Pulse 73   Temp 98.3 F (36.8 C) (Oral)   Resp 16   Ht 5\' 6"  (1.676 m)   Wt 99.7 kg   LMP 03/10/2022 (Approximate)   SpO2 100%   BMI 35.48 kg/m   Intake/Output Summary (Last 24 hours) at 05/01/2022 1449 Last data filed at 05/01/2022 0900 Gross per 24 hour  Intake 737.64 ml  Output 1 ml  Net 736.64 ml   Intake/Output: I/O last 3 completed shifts: In: 647.6 [P.O.:350; IV Piggyback:297.6] Out: 1 [Urine:1]  Intake/Output this shift:  Total I/O In: 240 [P.O.:240] Out: -  Weight change: 1.723 kg Gen:NAD CVS: RRR Resp:CTA Abd: +BS, soft, NT/ND Ext: 3+ pitting edema, bilateral lower extremities, right medial leg with significant ecchymosis and morbiliform rash extending from groin to feet.  Recent Labs  Lab 04/24/22 1802 04/25/22 0539 04/25/22 1627 04/26/22 0459 04/27/22 0450 04/28/22 0413 04/29/22 0306 05/01/22 0643  NA 137  --   --  139 137 135 133* 135  K 4.1  --   --  3.2* 4.1 3.3* 3.4* 4.3  CL 105  --   --  106 104 102 101 103  CO2 20*  --   --  22 22 23  21* 23  GLUCOSE 141*  --   --  142* 117* 130* 136* 103*  BUN 24*  --   --  21* 26* 22* 21* 22*  CREATININE 1.50*  --  1.44* 1.50* 1.69* 1.69* 1.63* 1.82*  ALBUMIN  --  2.4*  --  2.8* 2.9* 2.7* 2.8* 2.9*  CALCIUM 8.8*  --   --  8.6* 7.4* 8.9 8.8* 9.4  AST  --  122*  --  103* 71* 70* 75* 46*  ALT  --  154*  --  143* 124* 118* 117* 96*   Liver Function Tests: Recent Labs  Lab 04/28/22 0413 04/29/22 0306  05/01/22 0643  AST 70* 75* 46*  ALT 118* 117* 96*  ALKPHOS 128* 138* 156*  BILITOT 5.1* 5.1* 6.3*  PROT 6.0* 6.0* 6.6  ALBUMIN 2.7* 2.8* 2.9*   No results for input(s): "LIPASE", "AMYLASE" in the last 168 hours. No results for input(s): "AMMONIA" in the last 168 hours. CBC: Recent Labs  Lab 04/28/22 0413 04/29/22 0306 04/29/22 2346 04/30/22 0545 05/01/22 0643  WBC 13.9* 14.4* 15.8* 15.4* 17.9*  NEUTROABS 12.2* 13.2* 14.2* 13.1* 15.4*  HGB 8.1* 8.3* 9.1* 8.6* 9.2*  HCT 23.4* 24.7* 26.7* 25.1* 27.6*  MCV 101.3* 102.9* 100.8* 102.0* 103.4*  PLT 111* 108* 122* 112* 122*   Cardiac Enzymes: No results for input(s): "CKTOTAL", "CKMB", "CKMBINDEX", "TROPONINI" in the last 168 hours. CBG: Recent Labs  Lab 04/30/22 0814  GLUCAP 94    Iron Studies: No results for input(s): "IRON", "TIBC", "TRANSFERRIN", "FERRITIN" in the last 72 hours. Studies/Results: CT EXTREMITY LOWER RIGHT WO CONTRAST  Result Date: 04/30/2022 CLINICAL DATA:  Right thigh rash with erythema and warmth. Possible insect bite.  Evaluate for hematoma versus cellulitis. EXAM: CT OF THE LOWER RIGHT EXTREMITY WITHOUT CONTRAST TECHNIQUE: Multidetector CT imaging of the right lower extremity was performed according to the standard protocol. The images extend from the hip into the foot. RADIATION DOSE REDUCTION: This exam was performed according to the departmental dose-optimization program which includes automated exposure control, adjustment of the mA and/or kV according to patient size and/or use of iterative reconstruction technique. COMPARISON:  Ankle radiographs 04/28/2022. Knee radiographs 11/22/2014. Pelvic CT 04/10/2022. FINDINGS: Bones/Joint/Cartilage No evidence of acute fracture or dislocation. There is chronic sclerosis of the right femoral head, suspicious for chronic femoral head osteonecrosis. No subchondral collapse or significant joint effusion. There are mild degenerative changes at the right hip. There is also  mild sclerosis within the distal medial femoral condyle, suspicious for osteonecrosis without subchondral collapse. No significant knee joint effusion. There is spurring along the medial femoral epicondyle, likely due to remote MCL injury. No significant osseous findings identified within the lower leg or ankle. No significant ankle joint effusion. Ligaments Suboptimally assessed by CT. The cruciate ligaments appear grossly intact at the knee. Muscles and Tendons No focal muscular abnormalities are identified within the right thigh or lower leg. The extensor mechanism is intact at the knee. The visualized ankle tendons are intact. Soft tissues There is generalized subcutaneous edema within both visualized lower extremities. On the right, this appears most pronounced anterolaterally in the proximal to mid thigh. The edema is also more prominent in the medial aspect of the distal lower leg. No focal fluid collections are identified. There are no high-density components to suggest hematoma. Small subcutaneous calcifications in the distal lower leg. No evidence of foreign body or soft tissue emphysema. IMPRESSION: 1. Generalized subcutaneous edema in the right lower extremity with more focal components anterolaterally in the proximal to mid thigh and medially in the distal lower leg. This edema is nonspecific but could reflect soft tissue infection (cellulitis). No focal fluid collection or high density components identified to suggest hematoma. 2. No muscular abnormalities are identified. 3. No joint effusions or acute osseous findings. 4. 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 Probable osteonecrosis of the distal medial femoral condyle. Electronically Signed   By: Richardean Sale M.D.   On: 04/30/2022 09:57   CT EXTREMITY LOWER RIGHT WO CONTRAST  Result Date: 04/30/2022 CLINICAL DATA:  Right thigh rash with erythema and warmth. Possible insect bite. Evaluate for hematoma versus  cellulitis. EXAM: CT OF THE LOWER RIGHT EXTREMITY WITHOUT CONTRAST TECHNIQUE: Multidetector CT imaging of the right lower extremity was performed according to the standard protocol. The images extend from the hip into the foot. RADIATION DOSE REDUCTION: This exam was performed according to the departmental dose-optimization program which includes automated exposure control, adjustment of the mA and/or kV according to patient size and/or use of iterative reconstruction technique. COMPARISON:  Ankle radiographs 04/28/2022. Knee radiographs 11/22/2014. Pelvic CT 04/10/2022. FINDINGS: Bones/Joint/Cartilage No evidence of acute fracture or dislocation. There is chronic sclerosis of the right femoral head, suspicious for chronic femoral head osteonecrosis. No subchondral collapse or significant joint effusion. There are mild degenerative changes at the right hip. There is also mild sclerosis within the distal medial femoral condyle, suspicious for osteonecrosis without subchondral collapse. No significant knee joint effusion. There is spurring along the medial femoral epicondyle, likely due to remote MCL injury. No significant osseous findings identified within the lower leg or ankle. No significant ankle joint effusion. Ligaments Suboptimally assessed by CT. The cruciate ligaments appear  grossly intact at the knee. Muscles and Tendons No focal muscular abnormalities are identified within the right thigh or lower leg. The extensor mechanism is intact at the knee. The visualized ankle tendons are intact. Soft tissues There is generalized subcutaneous edema within both visualized lower extremities. On the right, this appears most pronounced anterolaterally in the proximal to mid thigh. The edema is also more prominent in the medial aspect of the distal lower leg. No focal fluid collections are identified. There are no high-density components to suggest hematoma. Small subcutaneous calcifications in the distal lower leg. No  evidence of foreign body or soft tissue emphysema. IMPRESSION: 1. Generalized subcutaneous edema in the right lower extremity with more focal components anterolaterally in the proximal to mid thigh and medially in the distal lower leg. This edema is nonspecific but could reflect soft tissue infection (cellulitis). No focal fluid collection or high density components identified to suggest hematoma. 2. No muscular abnormalities are identified. 3. No joint effusions or acute osseous findings. 4. 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 Probable osteonecrosis of the distal medial femoral condyle. Electronically Signed   By: Carey Bullocks M.D.   On: 04/30/2022 09:57    cyanocobalamin  1,000 mcg Oral Daily   diphenhydrAMINE-zinc acetate   Topical TID   ferrous sulfate  325 mg Oral Q breakfast   fluconazole  200 mg Oral Daily   folic acid  1 mg Oral Daily   furosemide  40 mg Oral BID   heparin injection (subcutaneous)  5,000 Units Subcutaneous Q8H   midodrine  10 mg Oral TID WC   pantoprazole  40 mg Oral BID AC   potassium chloride  40 mEq Oral Daily   prednisoLONE  40 mg Oral Daily   spironolactone  50 mg Oral Daily   sucralfate  1 g Oral Q6H    BMET    Component Value Date/Time   NA 135 05/01/2022 0643   K 4.3 05/01/2022 0643   CL 103 05/01/2022 0643   CO2 23 05/01/2022 0643   GLUCOSE 103 (H) 05/01/2022 0643   BUN 22 (H) 05/01/2022 0643   CREATININE 1.82 (H) 05/01/2022 0643   CREATININE 0.74 07/06/2021 1527   CALCIUM 9.4 05/01/2022 0643   GFRNONAA 35 (L) 05/01/2022 0643   GFRNONAA >60 07/06/2021 1527   GFRAA >60 11/24/2019 0314   CBC    Component Value Date/Time   WBC 17.9 (H) 05/01/2022 0643   RBC 2.67 (L) 05/01/2022 0643   HGB 9.2 (L) 05/01/2022 0643   HGB 13.9 01/18/2022 1457   HCT 27.6 (L) 05/01/2022 0643   PLT 122 (L) 05/01/2022 0643   PLT 190 01/18/2022 1457   MCV 103.4 (H) 05/01/2022 0643   MCH 34.5 (H) 05/01/2022 0643   MCHC 33.3  05/01/2022 0643   RDW Not Measured 05/01/2022 0643   LYMPHSABS 1.6 05/01/2022 0643   MONOABS 0.9 05/01/2022 0643   EOSABS 0.0 05/01/2022 0643   BASOSABS 0.0 05/01/2022 0643     Assessment/Plan: 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 She was seen by [facility] prior admission with AKI thought to be secondary to HRS and hypotension. At time of d/c 7/29 her weight was 100.4kg and now down to 99.7kg. Likely has a new baseline now ~1.4-1.6 with 1.4 possibly being diluted with massive volume overload at time of admission; therefore 1.6 may actually be her baseline.  Scr up to 1.82 possible due to acute cellulitis.  No N/V/D.  Continue with furosemide for now and follow UOP and Scr. Will add IV albumin to next furosemide dose to help with edema and diuresis. Avoid nephrotoxic medications including NSAIDs and iodinated contrast, unless absolutely necessary.   Preferred narcotic agents for pain control are hydromorphone, fentanyl, and methadone. Morphine should not be used.  Avoid Baclofen and avoid oral sodium phosphate and magnesium citrate based laxatives / bowel preps.  Continue strict Input and Output monitoring. Will monitor the patient closely with you and intervene or adjust therapy as indicated by changes in clinical status/labs. Cont diuresis as you are doing; still markedly overloaded.  Alcoholic hepatitis/cirrhosis with portal hypertension being diuresed with marked fluid overload. Right leg cellulitis/rash - currently on IV doxycycline.   Anemia of chronic disease s/p transfusion and EGD prior admission. Stage IV sacral decubitus ulcer  Irena Cords, MD [facility]

## 2022-05-02 LAB — COMPREHENSIVE METABOLIC PANEL
ALT: 76 U/L — ABNORMAL HIGH (ref 0–44)
AST: 40 U/L (ref 15–41)
Albumin: 2.6 g/dL — ABNORMAL LOW (ref 3.5–5.0)
Alkaline Phosphatase: 146 U/L — ABNORMAL HIGH (ref 38–126)
Anion gap: 10 (ref 5–15)
BUN: 27 mg/dL — ABNORMAL HIGH (ref 6–20)
CO2: 22 mmol/L (ref 22–32)
Calcium: 9.4 mg/dL (ref 8.9–10.3)
Chloride: 105 mmol/L (ref 98–111)
Creatinine, Ser: 1.73 mg/dL — ABNORMAL HIGH (ref 0.44–1.00)
GFR, Estimated: 37 mL/min — ABNORMAL LOW (ref >60)
Glucose, Bld: 167 mg/dL — ABNORMAL HIGH (ref 70–99)
Potassium: 4 mmol/L (ref 3.5–5.1)
Sodium: 137 mmol/L (ref 135–145)
Total Bilirubin: 4.5 mg/dL — ABNORMAL HIGH (ref 0.3–1.2)
Total Protein: 6.1 g/dL — ABNORMAL LOW (ref 6.5–8.1)

## 2022-05-02 LAB — CBC WITH DIFFERENTIAL/PLATELET
Abs Immature Granulocytes: 0.07 10*3/uL (ref 0.00–0.07)
Basophils Absolute: 0 10*3/uL (ref 0.0–0.1)
Basophils Relative: 0 %
Eosinophils Absolute: 0 10*3/uL (ref 0.0–0.5)
Eosinophils Relative: 0 %
HCT: 24 % — ABNORMAL LOW (ref 36.0–46.0)
Hemoglobin: 7.9 g/dL — ABNORMAL LOW (ref 12.0–15.0)
Immature Granulocytes: 0 %
Lymphocytes Relative: 7 %
Lymphs Abs: 1.1 10*3/uL (ref 0.7–4.0)
MCH: 34.1 pg — ABNORMAL HIGH (ref 26.0–34.0)
MCHC: 32.9 g/dL (ref 30.0–36.0)
MCV: 103.4 fL — ABNORMAL HIGH (ref 80.0–100.0)
Monocytes Absolute: 0.9 10*3/uL (ref 0.1–1.0)
Monocytes Relative: 6 %
Neutro Abs: 13.7 10*3/uL — ABNORMAL HIGH (ref 1.7–7.7)
Neutrophils Relative %: 87 %
Platelets: 111 10*3/uL — ABNORMAL LOW (ref 150–400)
RBC: 2.32 MIL/uL — ABNORMAL LOW (ref 3.87–5.11)
WBC: 15.8 10*3/uL — ABNORMAL HIGH (ref 4.0–10.5)
nRBC: 0 % (ref 0.0–0.2)

## 2022-05-02 LAB — C-REACTIVE PROTEIN: CRP: 9 mg/dL — ABNORMAL HIGH (ref <1.0)

## 2022-05-02 LAB — PROTIME-INR
INR: 1.4 — ABNORMAL HIGH (ref 0.8–1.2)
Prothrombin Time: 16.6 seconds — ABNORMAL HIGH (ref 11.4–15.2)

## 2022-05-02 LAB — MAGNESIUM: Magnesium: 2.2 mg/dL (ref 1.7–2.4)

## 2022-05-02 MED ORDER — DOXYCYCLINE HYCLATE 100 MG PO TABS
100.0000 mg | ORAL_TABLET | Freq: Two times a day (BID) | ORAL | Status: DC
  Administered 2022-05-02 – 2022-05-03 (×2): 100 mg via ORAL
  Filled 2022-05-02 (×2): qty 1

## 2022-05-02 MED ORDER — DIPHENHYDRAMINE HCL 25 MG PO CAPS
25.0000 mg | ORAL_CAPSULE | Freq: Three times a day (TID) | ORAL | Status: DC | PRN
Start: 2022-05-02 — End: 2022-05-03
  Administered 2022-05-02 – 2022-05-03 (×3): 25 mg via ORAL
  Filled 2022-05-02 (×3): qty 1

## 2022-05-02 MED ORDER — ALBUMIN HUMAN 25 % IV SOLN
12.5000 g | Freq: Once | INTRAVENOUS | Status: AC
Start: 2022-05-02 — End: 2022-05-02
  Administered 2022-05-02: 12.5 g via INTRAVENOUS
  Filled 2022-05-02: qty 50

## 2022-05-02 NOTE — TOC Initial Note (Signed)
Transition of Care [facility]) - Initial/Assessment Note    Patient Details  Name: [name] MRN: [id number] Date of Birth: 1976-02-23  Transition of Care [facility]) CM/SW Contact:    Curlene Labrum, RN Phone Number: 05/02/2022, 11:39 AM  Clinical Narrative:                 CM met with the patient at the bedside to discuss transitions of care to home today/tomorrow once Medically stable for discharge.  The patient states that she was discharged home 2 days ago and was home for one hour before she returned to the ER and readmitted to the hospital.  [facility] is continuing to follow the patient for charity home health services - new HH order placed for RN since last admission visit.  The patient is set up for PCP follow up on 05/04/22 with Juluis Mire, NP and noted in discharge instructions for reminder for patient.  The patient's family involved at the home for wound care assistance as needed and transportation to medical appointment.  The patient has no insurance at this time and receiving assistance through the HR department for her previous job with [employer] since she has been out on disability after her son passed away 2 years ago.  CM will continue to follow the patient for discharge needs for home.  Expected Discharge Plan: [place] Barriers to Discharge: Continued Medical Work up   Patient Goals and CMS Choice Patient states their goals for this hospitalization and ongoing recovery are:: Return home. CMS Medicare.gov Compare Post Acute Care list provided to:: Patient Choice offered to / list presented to : Patient  Expected Discharge Plan and Services Expected Discharge Plan: [place]   Discharge Planning Services: CM Consult Post Acute Care Choice: [place] arrangements for the past 2 months: Apartment                                      Prior Living Arrangements/Services Living arrangements for the  past 2 months: Apartment Lives with:: Siblings Patient language and need for interpreter reviewed:: Yes Do you feel safe going back to the place where you live?: Yes      Need for Family Participation in Patient Care: Yes (Comment) Care giver support system in place?: Yes (comment)   Criminal Activity/Legal Involvement Pertinent to Current Situation/Hospitalization: No - Comment as needed  Activities of Daily Living      Permission Sought/Granted Permission sought to share information with : Case Manager Permission granted to share information with : Yes, Verbal Permission Granted              Emotional Assessment Appearance:: Appears stated age Attitude/Demeanor/Rapport: Gracious Affect (typically observed): Accepting Orientation: : Oriented to Self, Oriented to Place, Oriented to  Time, Oriented to Situation Alcohol / Substance Use: Not Applicable Psych Involvement: No (comment)  Admission diagnosis:  Rash [R21] Patient Active Problem List   Diagnosis Date Noted   Rash 04/30/2022   Anemia 51/70/0174   Metabolic acidosis 94/49/6759   Hypokalemia 04/19/2022   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[place]) 04/19/2022   Acute respiratory failure with hypoxia (HCC) 04/19/2022   Aspiration pneumonia ([place]) 04/19/2022   Anasarca 04/19/2022   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[place]) 04/19/2022   Marginal ulcer 04/19/2022   Obesity (BMI 30-39.9) 04/19/2022   Acute on chronic anemia 04/17/2022   Hypovolemic  shock ([place]) 04/17/2022   Thrombocytopenia ([place]) 13/64/3837   Alcoholic hepatitis 79/39/6886   Gastroenteritis due to norovirus 04/17/2022   AKI (acute kidney injury) ([place]) 04/10/2022   Iron deficiency anemia 07/07/2021   Acute cholecystitis due to biliary calculus 11/22/2019   Enlarged lymph nodes in armpit 08/01/2012   PCP:  Kerin Perna, NP Pharmacy:   [id number] [place] [id number]: [phone]: [phone] [facility] [address]Phone: [phone]: [phone]    Social Determinants of Health (SDOH) Interventions    Readmission Risk Interventions    05/02/2022   11:34 AM  Readmission Risk Prevention Plan  Transportation Screening Complete  Medication Review ([place] Complete  PCP or Specialist appointment within 3-5 days of discharge Complete  [place] or [place]  SW Recovery Care/Counseling Consult Complete  PCulpeperNot Applicable

## 2022-05-02 NOTE — Progress Notes (Addendum)
PROGRESS NOTE                                                                                                                                                                                                             Patient Demographics:    [name], is a 46 y.o. female, DOB - 1976-07-27, ZOX:[id number]  Outpatient Primary MD for the patient is [name], NP    LOS - 2  Admit date - 04/29/2022    Chief Complaint  Patient presents with   Rash       Brief Narrative (HPI from H&P)     46 y.o. 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  Patient now presents to [facility] hours after discharged with complaint of burning rash on inner right thigh.    Subjective:   Patient in bed, appears comfortable, denies any headache, no fever, no chest pain or pressure, no shortness of breath , no abdominal pain. No new focal weakness.  Right thigh rash is stabilizing, no pain.   Assessment  & Plan :    R.Thigh intense erythematous rash -according to the patient she was fine when she was discharged in the hospital, after leaving the hospital she sat on the outside brick wall of the hospital for a few minutes waiting for her diet, she subsequently went to her house and when she was changing her closed she noticed this rash on her right thigh, she said she could have been bitten by an insect while she was sitting on the wall.    Rash has minimal warmth which has almost completely  resolved, does look like a insect bite with superficial cellulitis versus superficial hematoma, discussed with radiologist and we obtained a CT scan of the soft tissue on 04/30/2022, CT suggestive of possible superficial cellulitis, continue IV doxycycline, afebrile, acute on chronic leukocytosis however CRP is high, MRSA PCR is negative, clinically rash and cellulitis improving, continue to  monitor on doxycycline.   Anasarca due to alcoholic cirrhosis and portal hypertension, also has CKD stage IIIa - She is in frank fluid overload hence her diuretic regimen will be continued, nephrology directing diuresis along with IV albumin as needed,  edema is improving, as per last admission she will continue her prednisolone dose and finish the course, stop date 05/13/2022.  Continue fluid restriction, needs outpatient [facility] follow-up postdischarge.  She had Unna boots bilaterally but this had to be removed due to her right lower extremity rash.     Alcoholic hepatitis  - as above.  Her discriminant factor last admission was 32 she is finishing her steroid dose.Post discharge she needs to follow-up with Dr. Lupita Leash.  Stop date for steroids 05/13/2022.   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  Recent renal ultrasound and UA were nonacute.   Hypotension.  Stable on midodrine which will be continued.  Acute on chronic anemia, anemia of chronic disease  -Patient underwent extensive work-up during recent admission.  She reported no frank blood loss since discharge.  EGD had shown marginal ulcer with no stigmata of acute bleeding, colonoscopy was deferred.  She also was noted to have hemorrhoids and likely some slow GI blood loss from  the marginal ulcer.  Possibly bone marrow suppression from alcohol.  Patient was followed by hematology and GI.  Patient had received Aranesp on 04/21/22, Received 1 unit of packed RBC on 04/25/2022, panel inconclusive, on oral iron supplementation continue along with PPI.   PUD/marginal ulcer on recent EGD  - Continue PPI, Carafate, continue Diflucan, was recommended total 10 days last admission she is finishing Diflucan course soon.  Continued medications from previous admission unchanged.  Counseled on compliance outpatient GI follow-up postdischarge.     Chronic thrombocytopenia  - Likely due to bone marrow suppression, alcoholic liver disease, monitor no signs of active bleeding   Stage IV pressure ulcer of sacral region  -wound care consulted, this is chronic and was present on admission, is not actively infected, will order RN wound care at home upon discharge if she qualifies for it if not it will be done at PCP office.        Condition - Fair  Family Communication  :  None present  Code Status :  Full  Consults  :  None  PUD Prophylaxis : PPI   Procedures  :     CT R leg - 1. Generalized subcutaneous edema in the right lower extremity with more focal components anterolaterally in the proximal to mid thigh and medially in the distal lower leg. This edema is nonspecific but could reflect soft tissue infection (cellulitis). No focal fluid collection or high density components identified to suggest hematoma. 2. No muscular abnormalities are identified. 3. No joint effusions or acute osseous findings. 4. 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 Probable osteonecrosis of the distal medial femoral condyle.      Disposition Plan  :    Status is: Observation  DVT Prophylaxis  :    heparin injection 5,000 Units Start: 04/30/22 0915 SCDs Start: 04/30/22 0525    Lab Results  Component Value Date   PLT 111 (L) 05/02/2022    Diet :  Diet Order              Diet 2 gram sodium Room service appropriate? Yes; Fluid consistency: Thin; Fluid restriction: 1200 mL Fluid  Diet effective now                    Inpatient Medications  Scheduled Meds:  cyanocobalamin  1,000 mcg Oral Daily   diphenhydrAMINE-zinc acetate   Topical TID   doxycycline  100 mg Oral Q12H   ferrous sulfate  325 mg Oral Q breakfast   folic acid  1 mg Oral Daily   furosemide  40 mg Oral BID   heparin injection (subcutaneous)  5,000 Units Subcutaneous Q8H   midodrine  10 mg Oral TID WC   pantoprazole  40 mg Oral BID AC   potassium chloride  40 mEq Oral Daily   prednisoLONE  40 mg Oral Daily   spironolactone  50 mg Oral Daily   sucralfate  1 g Oral Q6H   Continuous Infusions:  doxycycline (VIBRAMYCIN) IV 100 mg (05/02/22 0952)   PRN Meds:.albuterol, busPIRone, diphenhydrAMINE, fentaNYL (SUBLIMAZE) injection, ondansetron **OR** ondansetron (ZOFRAN) IV, oxyCODONE  Time Spent in minutes  30   Lala Lund M.D on 05/02/2022 at 10:14 AM  To page go to [email or web address]   [facility] -  Office  [phone]  See all Orders from today for further details    Objective:   Vitals:   05/01/22 0842 05/01/22 2126 05/01/22 2300 05/02/22 0724  BP: (!) 105/51 (!) 107/55  (!) 106/57  Pulse: 73 63  69  Resp: 16 18  16   Temp: 98.3 F (36.8 C) 98.7 F (37.1 C)  97.9 F (36.6 C)  TempSrc: Oral Oral  Oral  SpO2: 100%   100%  Weight:   96.9 kg   Height:        Wt Readings from Last 3 Encounters:  05/01/22 96.9 kg  04/22/22 100.4 kg  01/18/22 99.2 kg     Intake/Output Summary (Last 24 hours) at 05/02/2022 1014 Last data filed at 05/02/2022 0900 Gross per 24 hour  Intake 1070.11 ml  Output --  Net 1070.11 ml     Physical Exam  Awake Alert, No new F.N deficits, Normal affect [place].AT,PERRAL, has bilateral scleral icterus Supple Neck, No JVD,   Symmetrical Chest wall movement, Good air movement bilaterally, CTAB RRR,No Gallops, Rubs or new Murmurs,   +ve B.Sounds, Abd Soft, No tenderness,   2 - 3+ leg edema   R thigh ++ largeerythematous rash now improved, below is the picture on the day of admission, minimal to no warmth      Data Review:    CBC Recent Labs  Lab 04/29/22 0306 04/29/22 2346 04/30/22 0545 05/01/22 0643 05/02/22 0548  WBC 14.4* 15.8* 15.4* 17.9* 15.8*  HGB 8.3* 9.1* 8.6* 9.2* 7.9*  HCT 24.7* 26.7* 25.1* 27.6* 24.0*  PLT 108* 122* 112* 122* 111*  MCV 102.9* 100.8* 102.0* 103.4* 103.4*  MCH 34.6* 34.3* 35.0* 34.5* 34.1*  MCHC 33.6 34.1 34.3 33.3 32.9  RDW Not Measured Not Measured Not Measured Not Measured Not Measured  LYMPHSABS 0.6* 1.0 1.7 1.6 1.1  MONOABS 0.4 0.6 0.5 0.9 0.9  EOSABS 0.1 0.0 0.0 0.0 0.0  BASOSABS 0.0 0.0 0.0 0.0 0.0    Electrolytes Recent Labs  Lab 04/27/22 0450 04/27/22 0500 04/28/22 0413 04/29/22 0306 04/29/22 2346 04/30/22 0545 05/01/22 0643 05/02/22 0548  NA 137  --  135 133*  --   --  135 137  K 4.1  --  3.3* 3.4*  --   --  4.3 4.0  CL 104  --  102 101  --   --  103 105  CO2 22  --  23 21*  --   --  23 22  GLUCOSE 117*  --  130* 136*  --   --  103* 167*  BUN 26*  --  22* 21*  --   --  22* 27*  CREATININE 1.69*  --  1.69* 1.63*  --   --  1.82* 1.73*  CALCIUM 7.4*  --  8.9 8.8*  --   --  9.4 9.4  AST 71*  --  70* 75*  --   --  46* 40  ALT 124*  --  118* 117*  --   --  96* 76*  ALKPHOS 100  --  128* 138*  --   --  156* 146*  BILITOT 6.4*  --  5.1* 5.1*  --   --  6.3* 4.5*  ALBUMIN 2.9*  --  2.7* 2.8*  --   --  2.9* 2.6*  MG 1.3*  --  2.0 1.7  --   --  1.6* 2.2  CRP  --   --   --   --   --  1.7* 11.5* 9.0*  PROCALCITON  --   --   --   --  0.12  --   --   --   INR 1.6*  --  1.4* 1.4*  --   --  1.5* 1.4*  BNP  --  804.0* 818.1* 967.2*  --   --   --   --     Micro Results Recent Results (from the past 240 hour(s))  MRSA Next Gen by PCR, Nasal     Status: None   Collection Time: 04/30/22 10:18 AM   Specimen: Nasal Mucosa; Nasal Swab  Result Value Ref Range Status    MRSA by PCR Next Gen NOT DETECTED NOT DETECTED Final    Comment: (NOTE) The GeneXpert MRSA Assay (FDA approved for NASAL specimens only), is one component of a comprehensive MRSA colonization surveillance program. It is not intended to diagnose MRSA infection nor to guide or monitor treatment for MRSA infections. Test performance is not FDA approved in patients less than 19 years old. Performed at [facility], [place] [address] [place], [place] [address]    Radiology Reports CT EXTREMITY LOWER RIGHT WO CONTRAST  Result Date: 04/30/2022 CLINICAL DATA:  Right thigh rash with erythema and warmth. Possible insect bite. Evaluate for hematoma versus cellulitis. EXAM: CT OF THE LOWER RIGHT EXTREMITY WITHOUT CONTRAST TECHNIQUE: Multidetector CT imaging of the right lower extremity was performed according to the standard protocol. The images extend from the hip into the foot. RADIATION DOSE REDUCTION: This exam was performed according to the departmental dose-optimization program which includes automated exposure control, adjustment of the mA and/or kV according to patient size and/or use of iterative reconstruction technique. COMPARISON:  Ankle radiographs 04/28/2022. Knee radiographs 11/22/2014. Pelvic CT 04/10/2022. FINDINGS: Bones/Joint/Cartilage No evidence of acute fracture or dislocation. There is chronic sclerosis of the right femoral head, suspicious for chronic femoral head osteonecrosis. No subchondral collapse or significant joint effusion. There are mild degenerative changes at the right hip. There is also mild sclerosis within the distal medial femoral condyle, suspicious for osteonecrosis without subchondral collapse. No significant knee joint effusion. There is spurring along the medial femoral epicondyle, likely due  to remote MCL injury. No significant osseous findings identified within the lower leg or ankle. No significant ankle joint effusion. Ligaments Suboptimally assessed by CT.  The cruciate ligaments appear grossly intact at the knee. Muscles and Tendons No focal muscular abnormalities are identified within the right thigh or lower leg. The extensor mechanism is intact at the knee. The visualized ankle tendons are intact. Soft tissues There is generalized subcutaneous edema within both visualized lower extremities. On the right, this appears most pronounced anterolaterally in the proximal to mid thigh. The edema is also more prominent in the medial aspect of the distal lower leg. No focal fluid collections are identified. There are no high-density components to suggest hematoma. Small subcutaneous calcifications in the distal lower leg. No evidence of foreign body or soft tissue emphysema. IMPRESSION: 1. Generalized subcutaneous edema in the right lower extremity with more focal components anterolaterally in the proximal to mid thigh and medially in the distal lower leg. This edema is nonspecific but could reflect soft tissue infection (cellulitis). No focal fluid collection or high density components identified to suggest hematoma. 2. No muscular abnormalities are identified. 3. No joint effusions or acute osseous findings. 4. 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 Probable osteonecrosis of the distal medial femoral condyle. Electronically Signed   By: Carey Bullocks M.D.   On: 04/30/2022 09:57   CT EXTREMITY LOWER RIGHT WO CONTRAST  Result Date: 04/30/2022 CLINICAL DATA:  Right thigh rash with erythema and warmth. Possible insect bite. Evaluate for hematoma versus cellulitis. EXAM: CT OF THE LOWER RIGHT EXTREMITY WITHOUT CONTRAST TECHNIQUE: Multidetector CT imaging of the right lower extremity was performed according to the standard protocol. The images extend from the hip into the foot. RADIATION DOSE REDUCTION: This exam was performed according to the departmental dose-optimization program which includes automated exposure control, adjustment of the  mA and/or kV according to patient size and/or use of iterative reconstruction technique. COMPARISON:  Ankle radiographs 04/28/2022. Knee radiographs 11/22/2014. Pelvic CT 04/10/2022. FINDINGS: Bones/Joint/Cartilage No evidence of acute fracture or dislocation. There is chronic sclerosis of the right femoral head, suspicious for chronic femoral head osteonecrosis. No subchondral collapse or significant joint effusion. There are mild degenerative changes at the right hip. There is also mild sclerosis within the distal medial femoral condyle, suspicious for osteonecrosis without subchondral collapse. No significant knee joint effusion. There is spurring along the medial femoral epicondyle, likely due to remote MCL injury. No significant osseous findings identified within the lower leg or ankle. No significant ankle joint effusion. Ligaments Suboptimally assessed by CT. The cruciate ligaments appear grossly intact at the knee. Muscles and Tendons No focal muscular abnormalities are identified within the right thigh or lower leg. The extensor mechanism is intact at the knee. The visualized ankle tendons are intact. Soft tissues There is generalized subcutaneous edema within both visualized lower extremities. On the right, this appears most pronounced anterolaterally in the proximal to mid thigh. The edema is also more prominent in the medial aspect of the distal lower leg. No focal fluid collections are identified. There are no high-density components to suggest hematoma. Small subcutaneous calcifications in the distal lower leg. No evidence of foreign body or soft tissue emphysema. IMPRESSION: 1. Generalized subcutaneous edema in the right lower extremity with more focal components anterolaterally in the proximal to mid thigh and medially in the distal lower leg. This edema is nonspecific but could reflect soft tissue infection (cellulitis). No focal fluid collection or high density components identified to  suggest  hematoma. 2. No muscular abnormalities are identified. 3. No joint effusions or acute osseous findings. 4. 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 Probable osteonecrosis of the distal medial femoral condyle. Electronically Signed   By: Richardean Sale M.D.   On: 04/30/2022 09:57

## 2022-05-02 NOTE — Progress Notes (Signed)
Patient ID: [name], female   DOB: 10/15/1975, 46 y.o.   MRN: [id number] S: Feeling a little better today and reports significant UOP but none recorded. O:BP (!) 106/57 (BP Location: Right Arm)   Pulse 69   Temp 97.9 F (36.6 C) (Oral)   Resp 16   Ht 5\' 6"  (1.676 m)   Wt 96.9 kg   LMP 03/10/2022 (Approximate)   SpO2 100%   BMI 34.48 kg/m   Intake/Output Summary (Last 24 hours) at 05/02/2022 1214 Last data filed at 05/02/2022 0900 Gross per 24 hour  Intake 1070.11 ml  Output --  Net 1070.11 ml   Intake/Output: I/O last 3 completed shifts: In: 790 [P.O.:790] Out: 1 [Urine:1]  Intake/Output this shift:  Total I/O In: 720.1 [IV Piggyback:720.1] Out: -  Weight change: -2.8 kg Gen: NAD CVS: RRR Resp:CTA Abd: +BS, soft, NT/ND Ext: 3+ pitting edema to thighs bilaterally, erythematous   Recent Labs  Lab 04/25/22 1627 04/26/22 0459 04/27/22 0450 04/28/22 0413 04/29/22 0306 05/01/22 0643 05/02/22 0548  NA  --  139 137 135 133* 135 137  K  --  3.2* 4.1 3.3* 3.4* 4.3 4.0  CL  --  106 104 102 101 103 105  CO2  --  22 22 23  21* 23 22  GLUCOSE  --  142* 117* 130* 136* 103* 167*  BUN  --  21* 26* 22* 21* 22* 27*  CREATININE 1.44* 1.50* 1.69* 1.69* 1.63* 1.82* 1.73*  ALBUMIN  --  2.8* 2.9* 2.7* 2.8* 2.9* 2.6*  CALCIUM  --  8.6* 7.4* 8.9 8.8* 9.4 9.4  AST  --  103* 71* 70* 75* 46* 40  ALT  --  143* 124* 118* 117* 96* 76*   Liver Function Tests: Recent Labs  Lab 04/29/22 0306 05/01/22 0643 05/02/22 0548  AST 75* 46* 40  ALT 117* 96* 76*  ALKPHOS 138* 156* 146*  BILITOT 5.1* 6.3* 4.5*  PROT 6.0* 6.6 6.1*  ALBUMIN 2.8* 2.9* 2.6*   No results for input(s): "LIPASE", "AMYLASE" in the last 168 hours. No results for input(s): "AMMONIA" in the last 168 hours. CBC: Recent Labs  Lab 04/29/22 0306 04/29/22 2346 04/30/22 0545 05/01/22 0643 05/02/22 0548  WBC 14.4* 15.8* 15.4* 17.9* 15.8*  NEUTROABS 13.2* 14.2* 13.1* 15.4* 13.7*  HGB 8.3* 9.1* 8.6* 9.2* 7.9*   HCT 24.7* 26.7* 25.1* 27.6* 24.0*  MCV 102.9* 100.8* 102.0* 103.4* 103.4*  PLT 108* 122* 112* 122* 111*   Cardiac Enzymes: No results for input(s): "CKTOTAL", "CKMB", "CKMBINDEX", "TROPONINI" in the last 168 hours. CBG: Recent Labs  Lab 04/30/22 0814  GLUCAP 94    Iron Studies: No results for input(s): "IRON", "TIBC", "TRANSFERRIN", "FERRITIN" in the last 72 hours. Studies/Results: No results found.  cyanocobalamin  1,000 mcg Oral Daily   diphenhydrAMINE-zinc acetate   Topical TID   doxycycline  100 mg Oral Q12H   ferrous sulfate  325 mg Oral Q breakfast   folic acid  1 mg Oral Daily   furosemide  40 mg Oral BID   heparin injection (subcutaneous)  5,000 Units Subcutaneous Q8H   midodrine  10 mg Oral TID WC   pantoprazole  40 mg Oral BID AC   potassium chloride  40 mEq Oral Daily   prednisoLONE  40 mg Oral Daily   spironolactone  50 mg Oral Daily   sucralfate  1 g Oral Q6H    BMET    Component Value Date/Time   NA 137 05/02/2022 0548  K 4.0 05/02/2022 0548   CL 105 05/02/2022 0548   CO2 22 05/02/2022 0548   GLUCOSE 167 (H) 05/02/2022 0548   BUN 27 (H) 05/02/2022 0548   CREATININE 1.73 (H) 05/02/2022 0548   CREATININE 0.74 07/06/2021 1527   CALCIUM 9.4 05/02/2022 0548   GFRNONAA 37 (L) 05/02/2022 0548   GFRNONAA >60 07/06/2021 1527   GFRAA >60 11/24/2019 0314   CBC    Component Value Date/Time   WBC 15.8 (H) 05/02/2022 0548   RBC 2.32 (L) 05/02/2022 0548   HGB 7.9 (L) 05/02/2022 0548   HGB 13.9 01/18/2022 1457   HCT 24.0 (L) 05/02/2022 0548   PLT 111 (L) 05/02/2022 0548   PLT 190 01/18/2022 1457   MCV 103.4 (H) 05/02/2022 0548   MCH 34.1 (H) 05/02/2022 0548   MCHC 32.9 05/02/2022 0548   RDW Not Measured 05/02/2022 0548   LYMPHSABS 1.1 05/02/2022 0548   MONOABS 0.9 05/02/2022 0548   EOSABS 0.0 05/02/2022 0548   BASOSABS 0.0 05/02/2022 0548    Assessment/Plan: Acute on CKD IIIa/b - her creatinine was normal 07/06/2021 with episode of acute kidney  injury mid July 2023 which improved to the 1.5-1.7 range. She was seen by [facility] prior admission with AKI thought to be secondary to HRS and hypotension. At time of d/c 7/29 her weight was 100.4kg and now down to 99.7kg. Likely has a new baseline now ~1.4-1.6 with 1.4 possibly being diluted with massive volume overload at time of admission; therefore 1.6 may actually be her baseline.  Scr up to 1.82 possible due to acute cellulitis and slightly better today at 1.73.  No N/V/D.  Continue with furosemide for now and follow UOP and Scr. Will continue IV albumin before am furosemide dose to help with edema and diuresis. Discussed the need to restrict fluid to 1.2 L/day and sodium of 2 grams/day. Avoid nephrotoxic medications including NSAIDs and iodinated contrast, unless absolutely necessary.   Preferred narcotic agents for pain control are hydromorphone, fentanyl, and methadone. Morphine should not be used.  Avoid Baclofen and avoid oral sodium phosphate and magnesium citrate based laxatives / bowel preps.  Continue strict Input and Output monitoring. Will monitor the patient closely with you and intervene or adjust therapy as indicated by changes in clinical status/labs. Cont diuresis as you are doing; still markedly overloaded.  Alcoholic hepatitis/cirrhosis with portal hypertension being diuresed with marked fluid overload. Right leg cellulitis/rash - currently on IV doxycycline.   Anemia of chronic disease s/p transfusion and EGD prior admission.  Hgb dropping again. Stage IV sacral decubitus ulcer  Irena Cords, MD [facility]

## 2022-05-03 ENCOUNTER — Other Ambulatory Visit (HOSPITAL_COMMUNITY): Payer: Self-pay

## 2022-05-03 DIAGNOSIS — L89154 Pressure ulcer of sacral region, stage 4: Secondary | ICD-10-CM

## 2022-05-03 DIAGNOSIS — K7010 Alcoholic hepatitis without ascites: Secondary | ICD-10-CM

## 2022-05-03 DIAGNOSIS — N179 Acute kidney failure, unspecified: Secondary | ICD-10-CM

## 2022-05-03 DIAGNOSIS — L0390 Cellulitis, unspecified: Secondary | ICD-10-CM

## 2022-05-03 DIAGNOSIS — D649 Anemia, unspecified: Secondary | ICD-10-CM

## 2022-05-03 DIAGNOSIS — L03115 Cellulitis of right lower limb: Secondary | ICD-10-CM

## 2022-05-03 LAB — COMPREHENSIVE METABOLIC PANEL
ALT: 78 U/L — ABNORMAL HIGH (ref 0–44)
AST: 55 U/L — ABNORMAL HIGH (ref 15–41)
Albumin: 2.9 g/dL — ABNORMAL LOW (ref 3.5–5.0)
Alkaline Phosphatase: 147 U/L — ABNORMAL HIGH (ref 38–126)
Anion gap: 11 (ref 5–15)
BUN: 25 mg/dL — ABNORMAL HIGH (ref 6–20)
CO2: 23 mmol/L (ref 22–32)
Calcium: 9.9 mg/dL (ref 8.9–10.3)
Chloride: 103 mmol/L (ref 98–111)
Creatinine, Ser: 1.87 mg/dL — ABNORMAL HIGH (ref 0.44–1.00)
GFR, Estimated: 34 mL/min — ABNORMAL LOW (ref >60)
Glucose, Bld: 122 mg/dL — ABNORMAL HIGH (ref 70–99)
Potassium: 4.2 mmol/L (ref 3.5–5.1)
Sodium: 137 mmol/L (ref 135–145)
Total Bilirubin: 4.8 mg/dL — ABNORMAL HIGH (ref 0.3–1.2)
Total Protein: 6.8 g/dL (ref 6.5–8.1)

## 2022-05-03 LAB — CBC WITH DIFFERENTIAL/PLATELET
Abs Immature Granulocytes: 0.05 10*3/uL (ref 0.00–0.07)
Basophils Absolute: 0 10*3/uL (ref 0.0–0.1)
Basophils Relative: 0 %
Eosinophils Absolute: 0 10*3/uL (ref 0.0–0.5)
Eosinophils Relative: 0 %
HCT: 25.3 % — ABNORMAL LOW (ref 36.0–46.0)
Hemoglobin: 8.3 g/dL — ABNORMAL LOW (ref 12.0–15.0)
Immature Granulocytes: 0 %
Lymphocytes Relative: 10 %
Lymphs Abs: 1.3 10*3/uL (ref 0.7–4.0)
MCH: 34.2 pg — ABNORMAL HIGH (ref 26.0–34.0)
MCHC: 32.8 g/dL (ref 30.0–36.0)
MCV: 104.1 fL — ABNORMAL HIGH (ref 80.0–100.0)
Monocytes Absolute: 0.7 10*3/uL (ref 0.1–1.0)
Monocytes Relative: 5 %
Neutro Abs: 10.9 10*3/uL — ABNORMAL HIGH (ref 1.7–7.7)
Neutrophils Relative %: 85 %
Platelets: 116 10*3/uL — ABNORMAL LOW (ref 150–400)
RBC: 2.43 MIL/uL — ABNORMAL LOW (ref 3.87–5.11)
RDW: 27.5 % — ABNORMAL HIGH (ref 11.5–15.5)
WBC: 13 10*3/uL — ABNORMAL HIGH (ref 4.0–10.5)
nRBC: 0 % (ref 0.0–0.2)

## 2022-05-03 LAB — PROTIME-INR
INR: 1.3 — ABNORMAL HIGH (ref 0.8–1.2)
Prothrombin Time: 16 seconds — ABNORMAL HIGH (ref 11.4–15.2)

## 2022-05-03 LAB — MAGNESIUM: Magnesium: 1.9 mg/dL (ref 1.7–2.4)

## 2022-05-03 LAB — C-REACTIVE PROTEIN: CRP: 6 mg/dL — ABNORMAL HIGH (ref <1.0)

## 2022-05-03 MED ORDER — DOXYCYCLINE HYCLATE 100 MG PO TABS
100.0000 mg | ORAL_TABLET | Freq: Two times a day (BID) | ORAL | 0 refills | Status: AC
  Filled 2022-05-03: qty 10, 5d supply, fill #0

## 2022-05-03 NOTE — Discharge Summary (Signed)
Physician Discharge Summary  [name] [id number] DOB: July 06, 1976 DOA: 04/29/2022  PCP: Kerin Perna, NP  Admit date: 04/29/2022 Discharge date: 05/03/2022  Admitted From: Home Disposition:  Home   Recommendations for Outpatient Follow-up:  Follow up with PCP in 1-2 weeks Please obtain BMP/CBC in one week 3.   Patient need to follow with [facility] as an outpatient   Discharge Condition:Stable CODE STATUS:FULL Diet recommendation: Heart Healthy  Brief/Interim Summary:  46 y.o. 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  Patient now presents to [facility] hours after discharged with complaint of burning rash on inner right thigh.     R.Thigh intense erythematous rash / cellulitis  -according to the patient she was fine when she was discharged in the hospital, after leaving the hospital she sat on the outside brick wall of the hospital for a few minutes waiting for her diet, she subsequently went to her house and when she was changing her closed she noticed this rash on her right thigh, she said she could have been bitten by an insect while she was sitting on the wall.     Rash has minimal warmth which has almost completely resolved, does look like a insect bite with superficial cellulitis versus superficial hematoma, discussed with radiologist and we obtained a CT scan of the soft tissue on 04/30/2022, CT suggestive of possible superficial cellulitis, continue IV doxycycline, afebrile, acute on chronic leukocytosis however CRP is  high, MRSA PCR is negative, clinically rash and cellulitis improving, she was treated with IV doxycycline, discharged on another 5 days of oral Doxy.     Anasarca due to alcoholic cirrhosis and portal hypertension, also has CKD stage IIIa - She is in frank fluid overload hence her diuretic regimen will be continued, nephrology directing diuresis along with IV albumin as needed,  edema is improving, as per last admission she will continue her prednisolone dose and finish the course, stop date 05/13/2022.  Continue fluid restriction, needs outpatient [facility] follow-up postdischarge.  She had Unna boots bilaterally but this had to be removed due to her right lower extremity rash.      Alcoholic hepatitis  - as above.  Her discriminant factor last admission was 32 she is finishing her steroid dose.Post discharge she needs to follow-up with Dr. Lupita Leash.  Stop date for steroids 05/13/2022.   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  Recent renal ultrasound and UA were nonacute.   Hypotension.  Stable on midodrine which will be continued.   Acute on chronic anemia, anemia of chronic disease  -Patient underwent extensive work-up during recent admission.  She reported no frank blood loss since discharge.  EGD had shown marginal ulcer with no stigmata of acute bleeding, colonoscopy was deferred.  She also was noted to have hemorrhoids and likely some slow  GI blood loss from the marginal ulcer.  Possibly bone marrow suppression from alcohol.  Patient was followed by hematology and GI.  Patient had received Aranesp on 04/21/22, Received 1 unit of packed RBC on 04/25/2022, panel inconclusive, on oral iron supplementation continue along  with PPI.   PUD/marginal ulcer on recent EGD  - Continue PPI, Carafate, continue Diflucan, was recommended total 10 days last admission she is finishing Diflucan course Lori.  Continued medications from previous admission unchanged.  Counseled on compliance outpatient GI follow-up postdischarge.     Chronic thrombocytopenia  - Likely due to bone marrow suppression, alcoholic liver disease, monitor no signs of active bleeding   Stage IV pressure ulcer of sacral region  -wound care consulted, this is chronic and was present on admission, is not actively infected, home health RN will be arranged   Discharge Diagnoses:  Principal Problem:   Rash Active Problems:   Alcoholic hepatitis   Iron deficiency anemia   AKI (acute kidney injury) (HCC)   Acute on chronic anemia   Thrombocytopenia (HCC)   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HCC)   Anasarca   Obesity (BMI 30-39.9)   Cellulitis    Discharge Instructions  Discharge Instructions     Diet - low sodium heart healthy   Complete by: As directed    Discharge instructions   Complete by: As directed    Follow with Primary MD [name], NP in 7 days   Get CBC, CMP, checked  by Primary MD next visit.    Activity: As tolerated with Full fall precautions use walker/cane & assistance as needed   Disposition Home    Diet: Heart Healthy, LOW SALT   On your next visit with your primary care physician please Get Medicines reviewed and adjusted.   Please request your Prim.MD to go over all Hospital Tests and Procedure/Radiological results at the follow up, please get all Hospital records sent to your Prim MD by signing hospital release before you go home.   If you experience worsening of your admission symptoms, develop shortness of breath, life threatening emergency, suicidal or homicidal thoughts you must seek medical attention immediately by calling 911 or calling your MD immediately  if symptoms less severe.  You  Must read complete instructions/literature along with all the possible adverse reactions/side effects for all the Medicines you take and that have been prescribed to you. Take any new Medicines after you have completely understood and accpet all the possible adverse reactions/side effects.   Do not drive, operating heavy machinery, perform activities at heights, swimming or participation in water activities or provide baby sitting services if your were admitted for syncope or siezures until you have seen by Primary MD or a Neurologist and advised to do so again.  Do not drive when taking Pain medications.    Do not take more than prescribed Pain, Sleep and Anxiety Medications  Special Instructions: If you have smoked or chewed Tobacco  in the last 2 yrs please stop smoking, stop any regular Alcohol  and or any Recreational drug use.  Wear Seat belts while driving.   Please note  You were cared for by a hospitalist during your hospital stay. If you have any questions about your discharge medications or the care you received while you were in the hospital after you are discharged, you can call the unit and asked to speak with the hospitalist on call if the hospitalist that took care of you is not  available. Once you are discharged, your primary care physician will handle any further medical issues. Please note that NO REFILLS for any discharge medications will be authorized once you are discharged, as it is imperative that you return to your primary care physician (or establish a relationship with a primary care physician if you do not have one) for your aftercare needs so that they can reassess your need for medications and monitor your lab values.   Increase activity slowly   Complete by: As directed    No wound care   Complete by: As directed       Allergies as of 05/03/2022       Reactions   Tape Rash   Clear tape        Medication List     STOP taking these medications     fluconazole 200 MG tablet Commonly known as: Diflucan       TAKE these medications    B-12 PO Take 1 tablet by mouth daily.   busPIRone 15 MG tablet Commonly known as: BUSPAR Take 15 mg by mouth daily as needed (anxiety).   doxycycline 100 MG tablet Commonly known as: VIBRA-TABS Take 1 tablet (100 mg total) by mouth every 12 (twelve) hours for 5 days.   ferrous sulfate 325 (65 FE) MG tablet Take 1 tablet (325 mg total) by mouth daily with breakfast.   folic acid 1 MG tablet Commonly known as: FOLVITE Take 1 tablet (1 mg total) by mouth daily.   furosemide 40 MG tablet Commonly known as: Lasix Take 1 tablet (40 mg total) by mouth daily for 15 days.   loperamide 2 MG capsule Commonly known as: IMODIUM Take 1 capsule (2 mg total) by mouth as needed for diarrhea or loose stools.   midodrine 10 MG tablet Commonly known as: PROAMATINE Take 1 tablet (10 mg total) by mouth 3 (three) times daily with meals.   multivitamin tablet Take 1 tablet by mouth daily.   pantoprazole 40 MG tablet Commonly known as: PROTONIX Take 1 tablet (40 mg total) by mouth 2 (two) times daily before a meal.   potassium chloride 10 MEQ tablet Commonly known as: KLOR-CON Take 1 tablet (10 mEq total) by mouth daily.   prednisoLONE 15 MG/5ML Soln Commonly known as: PRELONE Take 39.9 mg by mouth daily. What changed: Another medication with the same name was removed. Continue taking this medication, and follow the directions you see here.   spironolactone 50 MG tablet Commonly known as: ALDACTONE Take 1 tablet (50 mg total) by mouth daily.   sucralfate 1 GM/10ML suspension Commonly known as: CARAFATE Take 10 mLs (1 g total) by mouth every 6 (six) hours.        Follow-up Information     Care, [facility] Follow up.   Specialty: Home Health Services Why: Alvis Lemmings will call you in the next 24-48 hours to set up home health visits. Contact information: [address] [place] [address] [phone]         Kerin Perna, NP. Go on 05/04/2022.   Specialty: Internal Medicine Why: You are scheduled for a hospital follow up on May 04, 2022 at 2:30 pm. Contact information: [address] [place] [address] [phone]                Allergies  Allergen Reactions   Tape Rash    Clear tape     Procedures/Studies: CT EXTREMITY LOWER RIGHT WO CONTRAST  Result  Date: 04/30/2022 CLINICAL DATA:  Right thigh rash with erythema and warmth. Possible insect bite. Evaluate for hematoma versus cellulitis. EXAM: CT OF THE LOWER RIGHT EXTREMITY WITHOUT CONTRAST TECHNIQUE: Multidetector CT imaging of the right lower extremity was performed according to the standard protocol. The images extend from the hip into the foot. RADIATION DOSE REDUCTION: This exam was performed according to the departmental dose-optimization program which includes automated exposure control, adjustment of the mA and/or kV according to patient size and/or use of iterative reconstruction technique. COMPARISON:  Ankle radiographs 04/28/2022. Knee radiographs 11/22/2014. Pelvic CT 04/10/2022. FINDINGS: Bones/Joint/Cartilage No evidence of acute fracture or dislocation. There is chronic sclerosis of the right femoral head, suspicious for chronic femoral head osteonecrosis. No subchondral collapse or significant joint effusion. There are mild degenerative changes at the right hip. There is also mild sclerosis within the distal medial femoral condyle, suspicious for osteonecrosis without subchondral collapse. No significant knee joint effusion. There is spurring along the medial femoral epicondyle, likely due to remote MCL injury. No significant osseous findings identified within the lower leg or ankle. No significant ankle joint effusion. Ligaments Suboptimally assessed by CT. The cruciate ligaments appear grossly intact at the knee. Muscles and Tendons No focal muscular  abnormalities are identified within the right thigh or lower leg. The extensor mechanism is intact at the knee. The visualized ankle tendons are intact. Soft tissues There is generalized subcutaneous edema within both visualized lower extremities. On the right, this appears most pronounced anterolaterally in the proximal to mid thigh. The edema is also more prominent in the medial aspect of the distal lower leg. No focal fluid collections are identified. There are no high-density components to suggest hematoma. Small subcutaneous calcifications in the distal lower leg. No evidence of foreign body or soft tissue emphysema. IMPRESSION: 1. Generalized subcutaneous edema in the right lower extremity with more focal components anterolaterally in the proximal to mid thigh and medially in the distal lower leg. This edema is nonspecific but could reflect soft tissue infection (cellulitis). No focal fluid collection or high density components identified to suggest hematoma. 2. No muscular abnormalities are identified. 3. No joint effusions or acute osseous findings. 4. 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 Probable osteonecrosis of the distal medial femoral condyle. Electronically Signed   By: Richardean Sale M.D.   On: 04/30/2022 09:57   CT EXTREMITY LOWER RIGHT WO CONTRAST  Result Date: 04/30/2022 CLINICAL DATA:  Right thigh rash with erythema and warmth. Possible insect bite. Evaluate for hematoma versus cellulitis. EXAM: CT OF THE LOWER RIGHT EXTREMITY WITHOUT CONTRAST TECHNIQUE: Multidetector CT imaging of the right lower extremity was performed according to the standard protocol. The images extend from the hip into the foot. RADIATION DOSE REDUCTION: This exam was performed according to the departmental dose-optimization program which includes automated exposure control, adjustment of the mA and/or kV according to patient size and/or use of iterative reconstruction technique.  COMPARISON:  Ankle radiographs 04/28/2022. Knee radiographs 11/22/2014. Pelvic CT 04/10/2022. FINDINGS: Bones/Joint/Cartilage No evidence of acute fracture or dislocation. There is chronic sclerosis of the right femoral head, suspicious for chronic femoral head osteonecrosis. No subchondral collapse or significant joint effusion. There are mild degenerative changes at the right hip. There is also mild sclerosis within the distal medial femoral condyle, suspicious for osteonecrosis without subchondral collapse. No significant knee joint effusion. There is spurring along the medial femoral epicondyle, likely due to remote MCL injury. No significant osseous findings identified within the lower leg or  ankle. No significant ankle joint effusion. Ligaments Suboptimally assessed by CT. The cruciate ligaments appear grossly intact at the knee. Muscles and Tendons No focal muscular abnormalities are identified within the right thigh or lower leg. The extensor mechanism is intact at the knee. The visualized ankle tendons are intact. Soft tissues There is generalized subcutaneous edema within both visualized lower extremities. On the right, this appears most pronounced anterolaterally in the proximal to mid thigh. The edema is also more prominent in the medial aspect of the distal lower leg. No focal fluid collections are identified. There are no high-density components to suggest hematoma. Small subcutaneous calcifications in the distal lower leg. No evidence of foreign body or soft tissue emphysema. IMPRESSION: 1. Generalized subcutaneous edema in the right lower extremity with more focal components anterolaterally in the proximal to mid thigh and medially in the distal lower leg. This edema is nonspecific but could reflect soft tissue infection (cellulitis). No focal fluid collection or high density components identified to suggest hematoma. 2. No muscular abnormalities are identified. 3. No joint effusions or acute osseous  findings. 4. 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 Probable osteonecrosis of the distal medial femoral condyle. Electronically Signed   By: Richardean Sale M.D.   On: 04/30/2022 09:57   DG Ankle Complete Left  Result Date: 04/28/2022 CLINICAL DATA:  Increased swelling and pain today. EXAM: LEFT ANKLE COMPLETE - 3+ VIEW COMPARISON:  None Available. FINDINGS: There is overlying material that may represent casting material versus a compression stocking. The ankle mortise is symmetric and intact. Mild distal anterior tibial plafond and mild adjacent dorsal talar head-neck junction degenerative osteophytosis on lateral view. Small plantar calcaneal heel spur. Minimal distal medial malleolar degenerative osteophytosis. No acute fracture or dislocation. IMPRESSION: Mild anterior tibiotalar osteoarthritis. Electronically Signed   By: Yvonne Kendall M.D.   On: 04/28/2022 08:53   US Abdomen Complete  Result Date: 04/26/2022 CLINICAL DATA:  Cirrhosis EXAM: ABDOMEN ULTRASOUND COMPLETE COMPARISON:  None Available. FINDINGS: Gallbladder: Surgically absent. Common bile duct: Diameter: 4.8 mm Liver: No focal lesion identified. Slightly nodular liver contour with increased parenchymal echogenicity. Portal vein is patent on color Doppler imaging with normal direction of blood flow towards the liver. IVC: No abnormality visualized. Pancreas: Not visualized due to overlying bowel gas. Spleen: Size and appearance within normal limits. Right Kidney: Length: 14.2. Echogenicity within normal limits. No mass or hydronephrosis visualized. Left Kidney: Length: 13.2. Echogenicity within normal limits. No mass or hydronephrosis visualized. Abdominal aorta: No aneurysm visualized. Other findings: Trace ascites seen about the liver. IMPRESSION: 1. Hepatic steatosis. Mildly nodular liver contour, concerning for cirrhosis. 2. Trace perihepatic ascites. Electronically Signed   By: Yetta Glassman M.D.   On:  04/26/2022 11:35   DG Chest 2 View  Result Date: 04/19/2022 CLINICAL DATA:  Aspiration pneumonia EXAM: CHEST - 2 VIEW COMPARISON:  04/15/2022 FINDINGS: Normal cardiac silhouette. There is fine interstitial pattern in the lungs. No focal consolidation. Pleural fluid or pneumothorax. Vast improvement in the airspace disease primarily LEFT lung seen on comparison exam. IMPRESSION: 1. Vast improvement in LEFT lung airspace disease. 2. Mild interstitial pattern may suggest interstitial edema. Electronically Signed   By: Suzy Bouchard M.D.   On: 04/19/2022 13:22   DG Chest Port 1 View  Result Date: 04/15/2022 CLINICAL DATA:  Respiratory disease EXAM: PORTABLE CHEST 1 VIEW COMPARISON:  Two days ago FINDINGS: Extensive airspace disease asymmetric to the left lung. Normal heart size and mediastinal contours. Right IJ line  with tip at the SVC, no reported history of hemoptysis. No visible effusion or air leak IMPRESSION: Worsening airspace disease now asymmetric to the left, asymmetry favoring pneumonia. Electronically Signed   By: Jorje Guild M.D.   On: 04/15/2022 06:18   DG CHEST PORT 1 VIEW  Result Date: 04/13/2022 CLINICAL DATA:  Shortness of breath and cough. EXAM: PORTABLE CHEST 1 VIEW COMPARISON:  Radiograph 04/10/2022 FINDINGS: Right internal jugular central venous catheter tip overlies the mid SVC. New areas of patchy bilateral airspace opacity, left perihilar, right upper lobe abutting the fissure and both lung bases. Stable heart size, upper normal, likely accentuated by technique. No pneumothorax or large pleural effusion. IMPRESSION: New patchy bilateral airspace opacity. This is new from exam 3 days ago. Differential considerations include pneumonia (including aspiration) or less likely pulmonary edema. Electronically Signed   By: Keith Rake M.D.   On: 04/13/2022 21:50   US Abdomen Limited RUQ (LIVER/GB)  Result Date: 04/10/2022 CLINICAL DATA:  Elevated liver function tests EXAM:  ULTRASOUND ABDOMEN LIMITED RIGHT UPPER QUADRANT COMPARISON:  CT earlier same day. FINDINGS: Gallbladder: Surgically absent. Common bile duct: Diameter: Common bile duct could not be identified. There is no intrahepatic ductal dilatation. Liver: Diffusely and markedly echogenic liver parenchyma consistent with the advanced fatty change shown by CT. No identifiable focal lesion. Portal vein is patent on color Doppler imaging with normal direction of blood flow towards the liver. Other: No ascites. IMPRESSION: Previous cholecystectomy. Diffusely echogenic liver consistent with diffuse fatty change. No evidence of intrahepatic ductal dilatation. The common duct could not be identified. Electronically Signed   By: Nelson Chimes M.D.   On: 04/10/2022 19:31   ECHOCARDIOGRAM COMPLETE  Result Date: 04/10/2022    ECHOCARDIOGRAM REPORT   Patient Name:   [name] Date of Exam: 04/10/2022 Medical Rec #:  [id number]        Height:       66.0 in Accession #:    [id number]       Weight:       218.7 lb Date of Birth:  June 15, 1976       BSA:          2.077 m Patient Age:    44 years         BP:           90/41 mmHg Patient Gender: F                HR:           69 bpm. Exam Location:  Inpatient Procedure: 2D Echo, Cardiac Doppler and Color Doppler Indications:    Murmur R01.1  History:        Patient has no prior history of Echocardiogram examinations.  Sonographer:    Bernadene Person RDCS Referring Phys: [place]  1. Left ventricular ejection fraction, by estimation, is >75%. The left ventricle has hyperdynamic function. The left ventricle has no regional wall motion abnormalities. Left ventricular diastolic parameters were normal.  2. Right ventricular systolic function is normal. The right ventricular size is normal. There is normal pulmonary artery systolic pressure.  3. The mitral valve is normal in structure. Trivial mitral valve regurgitation. No evidence of mitral stenosis.  4. The aortic valve was  not well visualized. Aortic valve regurgitation is not visualized. No aortic stenosis is present. Comparison(s): No prior Echocardiogram. Conclusion(s)/Recommendation(s): Otherwise normal echocardiogram, with minor abnormalities described in the report. FINDINGS  Left Ventricle: Left ventricular ejection fraction, by  estimation, is >75%. The left ventricle has hyperdynamic function. The left ventricle has no regional wall motion abnormalities. The left ventricular internal cavity size was normal in size. There is no left ventricular hypertrophy. Left ventricular diastolic parameters were normal. Right Ventricle: The right ventricular size is normal. Right vetricular wall thickness was not well visualized. Right ventricular systolic function is normal. There is normal pulmonary artery systolic pressure. The tricuspid regurgitant velocity is 2.62 m/s, and with an assumed right atrial pressure of 3 mmHg, the estimated right ventricular systolic pressure is [id number] mmHg. Left Atrium: Left atrial size was normal in size. Right Atrium: Right atrial size was normal in size. Pericardium: There is no evidence of pericardial effusion. Mitral Valve: The mitral valve is normal in structure. Trivial mitral valve regurgitation. No evidence of mitral valve stenosis. Tricuspid Valve: The tricuspid valve is normal in structure. Tricuspid valve regurgitation is trivial. No evidence of tricuspid stenosis. Aortic Valve: The aortic valve was not well visualized. Aortic valve regurgitation is not visualized. No aortic stenosis is present. Pulmonic Valve: The pulmonic valve was not well visualized. Pulmonic valve regurgitation is not visualized. Aorta: The aortic root and ascending aorta are structurally normal, with no evidence of dilitation and the aortic arch was not well visualized. Venous: The inferior vena cava was not well visualized. IAS/Shunts: The atrial septum is grossly normal.  LEFT VENTRICLE PLAX 2D LVIDd:         4.90 cm       Diastology LVIDs:         2.50 cm      LV e' medial:    8.24 cm/s LV PW:         1.10 cm      LV E/e' medial:  16.9 LV IVS:        1.10 cm      LV e' lateral:   10.30 cm/s LVOT diam:     2.00 cm      LV E/e' lateral: 13.5 LV SV:         104 LV SV Index:   50 LVOT Area:     3.14 cm  LV Volumes (MOD) LV vol d, MOD A2C: 103.0 ml LV vol d, MOD A4C: 111.0 ml LV vol s, MOD A2C: 21.9 ml LV vol s, MOD A4C: 22.9 ml LV SV MOD A2C:     81.1 ml LV SV MOD A4C:     111.0 ml LV SV MOD BP:      88.5 ml RIGHT VENTRICLE RV S prime:     12.10 cm/s TAPSE (M-mode): 2.6 cm LEFT ATRIUM             Index        RIGHT ATRIUM           Index LA diam:        4.00 cm 1.93 cm/m   RA Area:     12.10 cm LA Vol (A2C):   52.7 ml 25.37 ml/m  RA Volume:   29.00 ml  13.96 ml/m LA Vol (A4C):   50.2 ml 24.17 ml/m LA Biplane Vol: 54.2 ml 26.09 ml/m  AORTIC VALVE LVOT Vmax:   144.00 cm/s LVOT Vmean:  101.000 cm/s LVOT VTI:    0.331 m  AORTA Ao Root diam: 3.00 cm Ao Asc diam:  3.10 cm MITRAL VALVE                TRICUSPID VALVE MV Area (PHT): 4.60 cm  TR Peak grad:   27.5 mmHg MV Decel Time: 165 msec     TR Vmax:        262.00 cm/s MV E velocity: 139.00 cm/s MV A velocity: 106.00 cm/s  SHUNTS MV E/A ratio:  1.31         Systemic VTI:  0.33 m                             Systemic Diam: 2.00 cm Jodelle Red MD Electronically signed by Jodelle Red MD Signature Date/Time: 04/10/2022/7:16:08 PM    Final    DG Chest Port 1 View  Result Date: 04/10/2022 CLINICAL DATA:  Encounter for central line placement. EXAM: PORTABLE CHEST 1 VIEW COMPARISON:  11/22/2019 FINDINGS: Right jugular dialysis catheter has been placed. Catheter tip in the SVC region. Prominent bandlike density in the mid left lung is most compatible with subsegmental atelectasis. Overall, low lung volumes. Negative for a pneumothorax. Heart size is within normal limits. IMPRESSION: 1. Right jugular central line tip in the SVC region. Negative for pneumothorax. 2. Left  lung atelectasis. Electronically Signed   By: Richarda Overlie M.D.   On: 04/10/2022 15:51   CT ABDOMEN PELVIS WO CONTRAST  Result Date: 04/10/2022 CLINICAL DATA:  Nausea vomiting. EXAM: CT ABDOMEN AND PELVIS WITHOUT CONTRAST TECHNIQUE: Multidetector CT imaging of the abdomen and pelvis was performed following the standard protocol without IV contrast. RADIATION DOSE REDUCTION: This exam was performed according to the departmental dose-optimization program which includes automated exposure control, adjustment of the mA and/or kV according to patient size and/or use of iterative reconstruction technique. COMPARISON:  CT abdomen pelvis dated 10/19/2010. FINDINGS: Evaluation of this exam is limited in the absence of intravenous contrast. Lower chest: The visualized lung bases are clear. No intra-abdominal free air or free fluid. Hepatobiliary: Severe fatty liver. No intrahepatic dilatation. A 2 cm rounded structure along the medial left lobe of the liver (27/4) demonstrates similar attenuation as liver parenchyma, likely represent hepatic tissue. Cholecystectomy. Pancreas: Mild haziness adjacent to the uncinate process of the pancreas, possibly related to volume averaging. Correlation with pancreatic enzymes recommended to exclude pancreatitis. No dilatation of the main pancreatic duct or gland atrophy. Spleen: Normal in size without focal abnormality. Adrenals/Urinary Tract: The adrenal glands are unremarkable. The kidneys, visualized ureters, and the urinary bladder appear unremarkable. Stomach/Bowel: Postsurgical changes of gastric bypass. There is no bowel obstruction or active inflammation. The appendix is normal. Vascular/Lymphatic: Mild aortoiliac atherosclerotic disease. The IVC is unremarkable. No portal venous gas. There is no adenopathy. Reproductive: The uterus is anteverted. No adnexal masses. Surgical clips in the region of the left adnexa. Other: None Musculoskeletal: No acute or significant osseous  findings. IMPRESSION: 1. Severe fatty liver. 2. Artifact versus less likely mild acute pancreatitis. Correlation with pancreatic enzymes recommended. 3. No bowel obstruction. Normal appendix. 4. Aortic Atherosclerosis (ICD10-I70.0). Electronically Signed   By: Elgie Collard M.D.   On: 04/10/2022 02:42      Subjective: Patient denies any complaints today, reports she is feeling better, and wanting to go home.  Discharge Exam: Vitals:   05/02/22 2012 05/03/22 0811  BP: 119/64 112/61  Pulse: (!) 59 79  Resp: 16 16  Temp: 97.6 F (36.4 C) 98.5 F (36.9 C)  SpO2: 100% 100%   Vitals:   05/02/22 1713 05/02/22 2012 05/03/22 0600 05/03/22 0811  BP:  119/64  112/61  Pulse:  (!) 59  79  Resp:  16  16  Temp:  97.6 F (36.4 C)  98.5 F (36.9 C)  TempSrc:  Oral  Oral  SpO2:  100%  100%  Weight: 95.5 kg  95.2 kg   Height:        General: Pt is alert, awake, not in acute distress Cardiovascular: RRR, S1/S2 +, no rubs, no gallops Respiratory: CTA bilaterally, no wheezing, no rhonchi Abdominal: Soft, NT, ND, bowel sounds + Extremities: +1 edema bilaterally, right leg erythema currently with crusting,    The results of significant diagnostics from this hospitalization (including imaging, microbiology, ancillary and laboratory) are listed below for reference.     Microbiology: Recent Results (from the past 240 hour(s))  MRSA Next Gen by PCR, Nasal     Status: None   Collection Time: 04/30/22 10:18 AM   Specimen: Nasal Mucosa; Nasal Swab  Result Value Ref Range Status   MRSA by PCR Next Gen NOT DETECTED NOT DETECTED Final    Comment: (NOTE) The GeneXpert MRSA Assay (FDA approved for NASAL specimens only), is one component of a comprehensive MRSA colonization surveillance program. It is not intended to diagnose MRSA infection nor to guide or monitor treatment for MRSA infections. Test performance is not FDA approved in patients less than 62 years old. Performed at [facility], [place] [address], [place],  [address]      Labs: BNP (last 3 results) Recent Labs    04/27/22 0500 04/28/22 0413 04/29/22 0306  BNP 804.0* 818.1* [id number]*   Basic Metabolic Panel: Recent Labs  Lab 04/28/22 0413 04/29/22 0306 05/01/22 0643 05/02/22 0548 05/03/22 0709  NA 135 133* 135 137 137  K 3.3* 3.4* 4.3 4.0 4.2  CL 102 101 103 105 103  CO2 23 21* 23 22 23   GLUCOSE 130* 136* 103* 167* 122*  BUN 22* 21* 22* 27* 25*  CREATININE 1.69* 1.63* 1.82* 1.73* 1.87*  CALCIUM 8.9 8.8* 9.4 9.4 9.9  MG 2.0 1.7 1.6* 2.2 1.9   Liver Function Tests: Recent Labs  Lab 04/28/22 0413 04/29/22 0306 05/01/22 0643 05/02/22 0548 05/03/22 0709  AST 70* 75* 46* 40 55*  ALT 118* 117* 96* 76* 78*  ALKPHOS 128* 138* 156* 146* 147*  BILITOT 5.1* 5.1* 6.3* 4.5* 4.8*  PROT 6.0* 6.0* 6.6 6.1* 6.8  ALBUMIN 2.7* 2.8* 2.9* 2.6* 2.9*   No results for input(s): "LIPASE", "AMYLASE" in the last 168 hours. No results for input(s): "AMMONIA" in the last 168 hours. CBC: Recent Labs  Lab 04/29/22 2346 04/30/22 0545 05/01/22 0643 05/02/22 0548 05/03/22 0709  WBC 15.8* 15.4* 17.9* 15.8* 13.0*  NEUTROABS 14.2* 13.1* 15.4* 13.7* 10.9*  HGB 9.1* 8.6* 9.2* 7.9* 8.3*  HCT 26.7* 25.1* 27.6* 24.0* 25.3*  MCV 100.8* 102.0* 103.4* 103.4* 104.1*  PLT 122* 112* 122* 111* 116*   Cardiac Enzymes: No results for input(s): "CKTOTAL", "CKMB", "CKMBINDEX", "TROPONINI" in the last 168 hours. BNP: Invalid input(s): "POCBNP" CBG: Recent Labs  Lab 04/30/22 0814  GLUCAP 94   D-Dimer No results for input(s): "DDIMER" in the last 72 hours. Hgb A1c No results for input(s): "HGBA1C" in the last 72 hours. Lipid Profile No results for input(s): "CHOL", "HDL", "LDLCALC", "TRIG", "CHOLHDL", "LDLDIRECT" in the last 72 hours. Thyroid function studies No results for input(s): "TSH", "T4TOTAL", "T3FREE", "THYROIDAB" in the last 72 hours.  Invalid input(s): "FREET3" Anemia work up No results for  input(s): "VITAMINB12", "FOLATE", "FERRITIN", "TIBC", "IRON", "RETICCTPCT" in the last 72 hours. Urinalysis    Component Value  Date/Time   COLORURINE YELLOW 04/30/2022 1310   APPEARANCEUR CLEAR 04/30/2022 1310   LABSPEC 1.010 04/30/2022 1310   PHURINE 5.5 04/30/2022 1310   GLUCOSEU NEGATIVE 04/30/2022 1310   HGBUR MODERATE (A) 04/30/2022 1310   BILIRUBINUR NEGATIVE 04/30/2022 1310   KETONESUR NEGATIVE 04/30/2022 1310   PROTEINUR NEGATIVE 04/30/2022 1310   UROBILINOGEN 0.2 01/09/2013 2111   NITRITE NEGATIVE 04/30/2022 1310   LEUKOCYTESUR SMALL (A) 04/30/2022 1310   Sepsis Labs Recent Labs  Lab 04/30/22 0545 05/01/22 0643 05/02/22 0548 05/03/22 0709  WBC 15.4* 17.9* 15.8* 13.0*   Microbiology Recent Results (from the past 240 hour(s))  MRSA Next Gen by PCR, Nasal     Status: None   Collection Time: 04/30/22 10:18 AM   Specimen: Nasal Mucosa; Nasal Swab  Result Value Ref Range Status   MRSA by PCR Next Gen NOT DETECTED NOT DETECTED Final    Comment: (NOTE) The GeneXpert MRSA Assay (FDA approved for NASAL specimens only), is one component of a comprehensive MRSA colonization surveillance program. It is not intended to diagnose MRSA infection nor to guide or monitor treatment for MRSA infections. Test performance is not FDA approved in patients less than 71 years old. Performed at [facility], [place] [address], [place], [place] [address]      Time coordinating discharge: Over 30 minutes  SIGNED:   Phillips Climes, MD  [facility] 05/03/2022, 11:03 AM Pager   If 7PM-7AM, please contact night-coverage

## 2022-05-03 NOTE — Progress Notes (Signed)
Discharge orders have been place. AVS printed and reviewed with patient. Awaiting TOC medications to be delivered to bedside. PIV removed. To be discharged home with home health VIA private auto once TOC medications have been delivered

## 2022-05-03 NOTE — Discharge Instructions (Addendum)
Follow with Primary MD [name], NP in 7 days   Get CBC, CMP, checked  by Primary MD next visit.    Activity: As tolerated with Full fall precautions use walker/cane & assistance as needed   Disposition Home    Diet: Heart Healthy, LOW SALT   On your next visit with your primary care physician please Get Medicines reviewed and adjusted.   Please request your Prim.MD to go over all Hospital Tests and Procedure/Radiological results at the follow up, please get all Hospital records sent to your Prim MD by signing hospital release before you go home.   If you experience worsening of your admission symptoms, develop shortness of breath, life threatening emergency, suicidal or homicidal thoughts you must seek medical attention immediately by calling 911 or calling your MD immediately  if symptoms less severe.  You Must read complete instructions/literature along with all the possible adverse reactions/side effects for all the Medicines you take and that have been prescribed to you. Take any new Medicines after you have completely understood and accpet all the possible adverse reactions/side effects.   Do not drive, operating heavy machinery, perform activities at heights, swimming or participation in water activities or provide baby sitting services if your were admitted for syncope or siezures until you have seen by Primary MD or a Neurologist and advised to do so again.  Do not drive when taking Pain medications.    Do not take more than prescribed Pain, Sleep and Anxiety Medications  Special Instructions: If you have smoked or chewed Tobacco  in the last 2 yrs please stop smoking, stop any regular Alcohol  and or any Recreational drug use.  Wear Seat belts while driving.   Please note  You were cared for by a hospitalist during your hospital stay. If you have any questions about your discharge medications or the care you received while you were in the hospital after you  are discharged, you can call the unit and asked to speak with the hospitalist on call if the hospitalist that took care of you is not available. Once you are discharged, your primary care physician will handle any further medical issues. Please note that NO REFILLS for any discharge medications will be authorized once you are discharged, as it is imperative that you return to your primary care physician (or establish a relationship with a primary care physician if you do not have one) for your aftercare needs so that they can reassess your need for medications and monitor your lab values.

## 2022-05-03 NOTE — Plan of Care (Signed)

## 2022-05-03 NOTE — Progress Notes (Signed)
Patient ID: DEBROH SIELOFF, female   DOB: [birth year], 46 y.o.   MRN: [id number] S:Feels better. O:BP 112/61 (BP Location: Right Arm)   Pulse 79   Temp 98.5 F (36.9 C) (Oral)   Resp 16   Ht 5\' 6"  (1.676 m)   Wt 95.2 kg   LMP 03/10/2022 (Approximate)   SpO2 100%   BMI 33.88 kg/m   Intake/Output Summary (Last 24 hours) at 05/03/2022 1307 Last data filed at 05/03/2022 0800 Gross per 24 hour  Intake 500 ml  Output --  Net 500 ml   Intake/Output: I/O last 3 completed shifts: In: 1820.1 [P.O.:1100; IV Piggyback:720.1] Out: -   Intake/Output this shift:  Total I/O In: 250 [P.O.:250] Out: -  Weight change: -1.4 kg Gen: NAD CVS: RRR Resp:CTA Abd: +BS, soft, NT/ND Ext: 3+ pitting edema BLE, erythematous, scaly rash on right medial thigh extending down leg.  Recent Labs  Lab 04/27/22 0450 04/28/22 0413 04/29/22 0306 05/01/22 0643 05/02/22 0548 05/03/22 0709  NA 137 135 133* 135 137 137  K 4.1 3.3* 3.4* 4.3 4.0 4.2  CL 104 102 101 103 105 103  CO2 22 23 21* 23 22 23   GLUCOSE 117* 130* 136* 103* 167* 122*  BUN 26* 22* 21* 22* 27* 25*  CREATININE 1.69* 1.69* 1.63* 1.82* 1.73* 1.87*  ALBUMIN 2.9* 2.7* 2.8* 2.9* 2.6* 2.9*  CALCIUM 7.4* 8.9 8.8* 9.4 9.4 9.9  AST 71* 70* 75* 46* 40 55*  ALT 124* 118* 117* 96* 76* 78*   Liver Function Tests: Recent Labs  Lab 05/01/22 0643 05/02/22 0548 05/03/22 0709  AST 46* 40 55*  ALT 96* 76* 78*  ALKPHOS 156* 146* 147*  BILITOT 6.3* 4.5* 4.8*  PROT 6.6 6.1* 6.8  ALBUMIN 2.9* 2.6* 2.9*   No results for input(s): "LIPASE", "AMYLASE" in the last 168 hours. No results for input(s): "AMMONIA" in the last 168 hours. CBC: Recent Labs  Lab 04/29/22 2346 04/30/22 0545 05/01/22 0643 05/02/22 0548 05/03/22 0709  WBC 15.8* 15.4* 17.9* 15.8* 13.0*  NEUTROABS 14.2* 13.1* 15.4* 13.7* 10.9*  HGB 9.1* 8.6* 9.2* 7.9* 8.3*  HCT 26.7* 25.1* 27.6* 24.0* 25.3*  MCV 100.8* 102.0* 103.4* 103.4* 104.1*  PLT 122* 112* 122* 111* 116*   Cardiac  Enzymes: No results for input(s): "CKTOTAL", "CKMB", "CKMBINDEX", "TROPONINI" in the last 168 hours. CBG: Recent Labs  Lab 04/30/22 0814  GLUCAP 94    Iron Studies: No results for input(s): "IRON", "TIBC", "TRANSFERRIN", "FERRITIN" in the last 72 hours. Studies/Results: No results found.  cyanocobalamin  1,000 mcg Oral Daily   diphenhydrAMINE-zinc acetate   Topical TID   doxycycline  100 mg Oral Q12H   ferrous sulfate  325 mg Oral Q breakfast   folic acid  1 mg Oral Daily   furosemide  40 mg Oral BID   heparin injection (subcutaneous)  5,000 Units Subcutaneous Q8H   midodrine  10 mg Oral TID WC   pantoprazole  40 mg Oral BID AC   potassium chloride  40 mEq Oral Daily   prednisoLONE  40 mg Oral Daily   spironolactone  50 mg Oral Daily   sucralfate  1 g Oral Q6H    BMET    Component Value Date/Time   NA 137 05/03/2022 0709   K 4.2 05/03/2022 0709   CL 103 05/03/2022 0709   CO2 23 05/03/2022 0709   GLUCOSE 122 (H) 05/03/2022 0709   BUN 25 (H) 05/03/2022 0709   CREATININE 1.87 (H) 05/03/2022 07/03/2022  CREATININE 0.74 07/06/2021 1527   CALCIUM 9.9 05/03/2022 0709   GFRNONAA 34 (L) 05/03/2022 0709   GFRNONAA >60 07/06/2021 1527   GFRAA >60 11/24/2019 0314   CBC    Component Value Date/Time   WBC 13.0 (H) 05/03/2022 0709   RBC 2.43 (L) 05/03/2022 0709   HGB 8.3 (L) 05/03/2022 0709   HGB 13.9 01/18/2022 1457   HCT 25.3 (L) 05/03/2022 0709   PLT 116 (L) 05/03/2022 0709   PLT 190 01/18/2022 1457   MCV 104.1 (H) 05/03/2022 0709   MCH 34.2 (H) 05/03/2022 0709   MCHC 32.8 05/03/2022 0709   RDW 27.5 (H) 05/03/2022 0709   LYMPHSABS 1.3 05/03/2022 0709   MONOABS 0.7 05/03/2022 0709   EOSABS 0.0 05/03/2022 0709   BASOSABS 0.0 05/03/2022 0709    Assessment/Plan: 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 She was seen by [facility] prior admission with AKI thought to be secondary  to HRS and hypotension. At time of d/c 7/29 her weight was 100.4kg and now down to 99.7kg. Likely has a new baseline now ~1.6-1.8.  Discussed the need to restrict fluid to 1.2 L/day and sodium of 2 grams/day. Stable for discharge on furosemide 40 mg bid and will arrange for follow up in our office after discharge. Avoid nephrotoxic medications including NSAIDs and iodinated contrast, unless absolutely necessary.   Preferred narcotic agents for pain control are hydromorphone, fentanyl, and methadone. Morphine should not be used.  Avoid Baclofen and avoid oral sodium phosphate and magnesium citrate based laxatives / bowel preps.  Continue strict Input and Output monitoring. Will monitor the patient closely with you and intervene or adjust therapy as indicated by changes in clinical status/labs. Cont diuresis as you are doing; still markedly overloaded.  Alcoholic hepatitis/cirrhosis with portal hypertension being diuresed with marked fluid overload. Right leg cellulitis/rash - currently on IV doxycycline.   Anemia of chronic disease s/p transfusion and EGD prior admission.  Hgb dropping again. Stage IV sacral decubitus ulcer  Irena Cords, MD [facility]

## 2022-05-04 ENCOUNTER — Inpatient Hospital Stay (INDEPENDENT_AMBULATORY_CARE_PROVIDER_SITE_OTHER): Payer: Self-pay | Admitting: Primary Care

## 2022-05-04 ENCOUNTER — Telehealth: Payer: Self-pay

## 2022-05-04 NOTE — Telephone Encounter (Signed)
Transition Care Management Follow-up Telephone Call Date of discharge and from where: 05/03/2022, [facility] How have you been since you were released from the hospital? She is concerned about pain, swelling of her legs/feet. She was not given a prescription for pain medication when she left the hospital  Any questions or concerns? Yes -noted above She said she applied for Medicaid when she was in the hospital and was denied. The hospital financial counselor helped her apply for [facility] but she is not sure of the status of that application. She left a message for the financial counseling office but has not heard back from anyone.  She was given wound care supplies for her sacral ulcer. Her mother was instructed how to do the wound care but her mother is not with her 24/7, so she is not sure how it will get done.  The patient said she can put the dry dressing on the area but is not able to pack the wound. She has not heard from the home health nurse yet.   Items Reviewed: Did the pt receive and understand the discharge instructions provided? Yes  Medications obtained and verified? Yes  Other? Yes - she said she has all of her medications  Any new allergies since your discharge? No  Dietary orders reviewed? No Do you have support at home? Yes   Home Care and Equipment/Supplies: Were home health services ordered? yes If so, what is the name of the agency? [facility]   Has the agency set up a time to come to the patient's home? She has not heard from them yet.  Were any new equipment or medical supplies ordered?  Yes: wound care supplies What is the name of the medical supply agency? She received them form the hospital Were you able to get the supplies/equipment? yes Do you have any questions related to the use of the equipment or supplies? No  Functional Questionnaire: (I = Independent and D = Dependent) ADLs: independent; but needs assistance with wound care   Follow up  appointments reviewed:  PCP Hospital f/u appt confirmed? Yes  Scheduled to see Gwinda Passe, NP - this afternoon. She wants to keep that appointment because the next available appointment with Ms Randa Evens is not for 2 more weeks  Specialist Hospital f/u appt confirmed? Yes  Scheduled to see oncology - 07/12/2022.  Are transportation arrangements needed? No  If their condition worsens, is the pt aware to call PCP or go to the Emergency Dept.? Yes Was the patient provided with contact information for the PCP's office or ED? Yes Was to pt encouraged to call back with questions or concerns? Yes

## 2022-05-04 NOTE — Progress Notes (Signed)
TRANSITION OF CARE VISIT   Date of Admission: 04/29/2022  Date of Discharge: 05/03/2022  Transitions of Care Call: 05/04/2022  Discharged from: [facility]   Discharge Diagnosis:  Principal Problem:   Rash Active Problems:   Alcoholic hepatitis   Iron deficiency anemia   AKI (acute kidney injury) (HCC)   Acute on chronic anemia   Thrombocytopenia (HCC)   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HCC)   Anasarca   Obesity (BMI 30-39.9)   Cellulitis   Summary of Admission per MD note: Recommendations for Outpatient Follow-up:  Follow up with PCP in 1-2 weeks Please obtain BMP/CBC in one week 3.   Patient need to follow with [facility] as an outpatient  Brief/Interim Summary: 46 y.o. 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  Patient now presents to [facility] hours after discharged with complaint of burning rash on inner right thigh.      R.Thigh intense erythematous rash / cellulitis  -according to the patient she was fine when she was discharged in the hospital, after leaving the hospital she sat on the outside brick wall of the hospital for a few minutes waiting for her diet, she subsequently went to her house and when she was changing her closed she noticed this rash on her right thigh, she said she could have been bitten by an insect while she was sitting on the wall.     Rash has minimal warmth which has almost completely resolved, does look like a insect bite with  superficial cellulitis versus superficial hematoma, discussed with radiologist and we obtained a CT scan of the soft tissue on 04/30/2022, CT suggestive of possible superficial cellulitis, continue IV doxycycline, afebrile, acute on chronic leukocytosis however CRP is high, MRSA PCR is negative, clinically rash and cellulitis improving, she was treated with IV doxycycline, discharged on another 5 days of oral Doxy.     Anasarca due to alcoholic cirrhosis and portal hypertension, also has CKD stage IIIa - She is in frank fluid overload hence her diuretic regimen will be continued, nephrology directing diuresis along with IV albumin as needed,  edema is improving, as per last admission she will continue her prednisolone dose and finish the course, stop date 05/13/2022.  Continue fluid restriction, needs outpatient [facility] follow-up postdischarge.  She had Unna boots bilaterally but this had to be removed due to her right lower extremity rash.      Alcoholic hepatitis  - as above.  Her discriminant factor last admission was 32 she is finishing her steroid dose.Post discharge she needs to follow-up with Dr. Rushie Chestnut.  Stop date for steroids 05/13/2022.   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  Recent renal ultrasound and UA were nonacute.   Hypotension.  Stable on midodrine which will be continued.   Acute on  chronic anemia, anemia of chronic disease  -Patient underwent extensive work-up during recent admission.  She reported no frank blood loss since discharge.  EGD had shown marginal ulcer with no stigmata of acute bleeding, colonoscopy was deferred.  She also was noted to have hemorrhoids and likely some slow GI blood loss  from the marginal ulcer.  Possibly bone marrow suppression from alcohol.  Patient was followed by hematology and GI.  Patient had received Aranesp on 04/21/22, Received 1 unit of packed RBC on 04/25/2022, panel inconclusive, on oral iron supplementation continue along with PPI.   PUD/marginal ulcer on recent EGD  - Continue PPI, Carafate, continue Diflucan, was recommended total 10 days last admission she is finishing Diflucan course soon.  Continued medications from previous admission unchanged.  Counseled on compliance outpatient GI follow-up postdischarge.     Chronic thrombocytopenia  - Likely due to bone marrow suppression, alcoholic liver disease, monitor no signs of active bleeding   Stage IV pressure ulcer of sacral region  -wound care consulted, this is chronic and was present on admission, is not actively infected, home health RN will be arranged   Today's visit 05/05/2022: Bilateral leg weeping persisting. Swelling has improved since yesterday. She is able to get on bedroom shoes today. She has not heard from home health RN in regards to sacral ulcer care. Requesting an ortho post op shoe to help with balance. Taking all medications as prescribed, no refills needed. Endorses pain. Oxycodone given during hospital admission caused itching. We discussed that ongoing pain management will need to be managed by her primary care provider also limitations because of hepatitis. She is established with Oncology for anemia management. No further issues or concerns.   Patient/Caregiver self-reported problems/concerns: See above  MEDICATIONS  Medication Reconciliation conducted with patient/caregiver? (Yes/ No):Yes   New medications prescribed/discontinued upon discharge? (Yes/No): Yes  Barriers identified related to medications: No  LABS  Lab Reviewed (Yes/No/NA): Yes   PHYSICAL EXAM:  Physical Exam HENT:     Head: Normocephalic and atraumatic.  Eyes:     Extraocular Movements: Extraocular  movements intact.     Conjunctiva/sclera: Conjunctivae normal.     Pupils: Pupils are equal, round, and reactive to light.  Cardiovascular:     Rate and Rhythm: Normal rate and regular rhythm.     Pulses: Normal pulses.     Heart sounds: Normal heart sounds.  Pulmonary:     Effort: Pulmonary effort is normal.     Breath sounds: Normal breath sounds.  Musculoskeletal:     Cervical back: Normal range of motion and neck supple.  Skin:    General: Skin is warm and dry.     Findings: Erythema and rash present.     Comments: RLE dressing in place.  Neurological:     General: No focal deficit present.     Mental Status: She is alert and oriented to person, place, and time.  Psychiatric:        Mood and Affect: Mood normal.        Behavior: Behavior normal.    ASSESSMENT AND PLAN: 1. Hospital discharge follow-up - Reviewed hospital course, current medications, ensured proper follow-up in place, and addressed concerns.  - Patient instructed to follow-up with primary provider in 1 weeks for CMP and CBC. Patient verbalized understanding.  2. Acute on chronic anemia 3. Iron deficiency anemia, unspecified iron deficiency anemia type 4. Thrombocytopenia ([place]) - Keep all scheduled appointments with Hematology Oncology.   5. Alcoholic hepatitis, unspecified  whether ascites present 6. Anasarca 7. PUD (peptic ulcer disease) - Referral to Gastroenterology for further evaluation and management. - Referral to wound care center for further evaluation and management bilateral lower extremities weeping.  - As requested from patient ortho shoes ordered.  - Ambulatory referral to Gastroenterology - AMB referral to wound care center - PR ORTHO SHOE ADD FULL SOLE  8. Stage 3a chronic kidney disease (HCC) 9. AKI (acute kidney injury) [facility] - Referral to Nephrology for further evaluation and management.  - Ambulatory referral to Nephrology  10. Hypotension, unspecified hypotension type - Referral  to Cardiology for further evaluation and management.  - Ambulatory referral to Cardiology  11. Stage IV pressure ulcer of sacral region [facility]) - Referral to wound care center for further evaluation and management.  - AMB referral to wound care center  12. Cellulitis, unspecified cellulitis site 13. Rash - Continue present management. Follow-up with primary provider as scheduled.   PATIENT EDUCATION PROVIDED: See AVS   FOLLOW-UP (Include any further testing or referrals):  - Keep all scheduled appointments with Oncology.  - Referral to Cardiology.  - Referral to Gastroenterology.  - Referral to Nephrology.  - Referral to Wound Care Center.   Patient was given clear instructions to go to Emergency Department or return to medical center if symptoms don't improve, worsen, or new problems develop.The patient verbalized understanding.

## 2022-05-05 ENCOUNTER — Encounter: Payer: Self-pay | Admitting: Family

## 2022-05-05 ENCOUNTER — Ambulatory Visit (INDEPENDENT_AMBULATORY_CARE_PROVIDER_SITE_OTHER): Payer: Self-pay | Admitting: Family

## 2022-05-05 VITALS — BP 109/60 | HR 95 | Temp 98.3°F | Resp 16 | Wt 204.0 lb

## 2022-05-05 DIAGNOSIS — D649 Anemia, unspecified: Secondary | ICD-10-CM

## 2022-05-05 DIAGNOSIS — K279 Peptic ulcer, site unspecified, unspecified as acute or chronic, without hemorrhage or perforation: Secondary | ICD-10-CM

## 2022-05-05 DIAGNOSIS — Z09 Encounter for follow-up examination after completed treatment for conditions other than malignant neoplasm: Secondary | ICD-10-CM

## 2022-05-05 DIAGNOSIS — D509 Iron deficiency anemia, unspecified: Secondary | ICD-10-CM

## 2022-05-05 DIAGNOSIS — N179 Acute kidney failure, unspecified: Secondary | ICD-10-CM

## 2022-05-05 DIAGNOSIS — L89154 Pressure ulcer of sacral region, stage 4: Secondary | ICD-10-CM

## 2022-05-05 DIAGNOSIS — R21 Rash and other nonspecific skin eruption: Secondary | ICD-10-CM

## 2022-05-05 DIAGNOSIS — L0390 Cellulitis, unspecified: Secondary | ICD-10-CM

## 2022-05-05 DIAGNOSIS — D696 Thrombocytopenia, unspecified: Secondary | ICD-10-CM

## 2022-05-05 DIAGNOSIS — K7010 Alcoholic hepatitis without ascites: Secondary | ICD-10-CM

## 2022-05-05 DIAGNOSIS — R601 Generalized edema: Secondary | ICD-10-CM

## 2022-05-05 DIAGNOSIS — I959 Hypotension, unspecified: Secondary | ICD-10-CM

## 2022-05-05 DIAGNOSIS — N1831 Chronic kidney disease, stage 3a: Secondary | ICD-10-CM

## 2022-05-05 NOTE — Patient Instructions (Signed)
Peripheral Edema  Peripheral edema is swelling that is caused by a buildup of fluid. Peripheral edema most often affects the lower legs, ankles, and feet. It can also develop in the arms, hands, and face. The area of the body that has peripheral edema will look swollen. It may also feel heavy or warm. Your clothes may start to feel tight. Pressing on the area may make a temporary dent in your skin (pitting edema). You may not be able to move your swollen arm or leg as much as usual. There are many causes of peripheral edema. It can happen because of a complication of other conditions such as heart failure, kidney disease, or a problem with your circulation. It also can be a side effect of certain medicines or happen because of an infection. It often happens to women during pregnancy. Sometimes, the cause is not known. Follow these instructions at home: Managing pain, stiffness, and swelling  Raise (elevate) your legs while you are sitting or lying down. Move around often to prevent stiffness and to reduce swelling. Do not sit or stand for long periods of time. Do not wear tight clothing. Do not wear garters on your upper legs. Exercise your legs to get your circulation going. This helps to move the fluid back into your blood vessels, and it may help the swelling go down. Wear compression stockings as told by your health care provider. These stockings help to prevent blood clots and reduce swelling in your legs. It is important that these are the correct size. These stockings should be prescribed by your doctor to prevent possible injuries. If elastic bandages or wraps are recommended, use them as told by your health care provider. Medicines Take over-the-counter and prescription medicines only as told by your health care provider. Your health care provider may prescribe medicine to help your body get rid of excess water (diuretic). Take this medicine if you are told to take it. General  instructions Eat a low-salt (low-sodium) diet as told by your health care provider. Sometimes, eating less salt may reduce swelling. Pay attention to any changes in your symptoms. Moisturize your skin daily to help prevent skin from cracking and draining. Keep all follow-up visits. This is important. Contact a health care provider if: You have a fever. You have swelling in only one leg. You have increased swelling, redness, or pain in one or both of your legs. You have drainage or sores at the area where you have edema. Get help right away if: You have edema that starts suddenly or is getting worse, especially if you are pregnant or have a medical condition. You develop shortness of breath, especially when you are lying down. You have pain in your chest or abdomen. You feel weak. You feel like you will faint. These symptoms may be an emergency. Get help right away. Call 911. Do not wait to see if the symptoms will go away. Do not drive yourself to the hospital. Summary Peripheral edema is swelling that is caused by a buildup of fluid. Peripheral edema most often affects the lower legs, ankles, and feet. Move around often to prevent stiffness and to reduce swelling. Do not sit or stand for long periods of time. Pay attention to any changes in your symptoms. Contact a health care provider if you have edema that starts suddenly or is getting worse, especially if you are pregnant or have a medical condition. Get help right away if you develop shortness of breath, especially when lying down.   This information is not intended to replace advice given to you by your health care provider. Make sure you discuss any questions you have with your health care provider. Document Revised: 05/16/2021 Document Reviewed: 05/16/2021 Elsevier Patient Education  2023 Elsevier Inc.  

## 2022-05-15 ENCOUNTER — Ambulatory Visit (INDEPENDENT_AMBULATORY_CARE_PROVIDER_SITE_OTHER): Payer: Self-pay

## 2022-05-15 NOTE — Telephone Encounter (Signed)
FYI

## 2022-05-15 NOTE — Telephone Encounter (Signed)
  Chief Complaint: bilateral ankle and feet swelling and pain Symptoms: severe pain, left ankle worse than right. Pt nauseated and occasional vomiting of water. Pt stated she is able to tolerate ginger ale.  Frequency: pt stated the swelling is ongoing Pertinent Negatives: Patient denies SOB, chest pain, fever Disposition: [] ED /[] Urgent Care (no appt availability in office) / [x] Appointment(In office/virtual)/ []  [facility]/ [] Home Care/ [] Refused Recommended Disposition /[] [facility]/ []  Follow-up with PCP Additional Notes: In office appt tomorrow with PCP. Refused appt offered for today. Additional Information  Commented on: [1] MODERATE leg swelling (e.g., swelling extends up to knees) AND [2] new-onset or worsening    Feet and ankles only/worsening and painful  Answer Assessment - Initial Assessment Questions 1. ONSET: "When did the swelling start?" (e.g., minutes, hours, days)     Ongoing  2. LOCATION: "What part of the leg is swollen?"  "Are both legs swollen or just one leg?"     Both feet and ankles left worse than right 3. SEVERITY: "How bad is the swelling?" (e.g., localized; mild, moderate, severe)   - Localized: Small area of swelling localized to one leg.   - MILD pedal edema: Swelling limited to foot and ankle, pitting edema < 1/4 inch (6 mm) deep, rest and elevation eliminate most or all swelling.   - MODERATE edema: Swelling of lower leg to knee, pitting edema > 1/4 inch (6 mm) deep, rest and elevation only partially reduce swelling.   - SEVERE edema: Swelling extends above knee, facial or hand swelling present.      mild 4. REDNESS: "Does the swelling look red or infected?"     yes 5. PAIN: "Is the swelling painful to touch?" If Yes, ask: "How painful is it?"   (Scale 1-10; mild, moderate or severe)     severe 6. FEVER: "Do you have a fever?" If Yes, ask: "What is it, how was it measured, and when did it start?"      no 7. CAUSE: "What do you  think is causing the leg swelling?"     *No Answer* 8. MEDICAL HISTORY: "Do you have a history of blood clots (e.g., DVT), cancer, heart failure, kidney disease, or liver failure?"     Kidney disease going to heart doctor 9. RECURRENT SYMPTOM: "Have you had leg swelling before?" If Yes, ask: "When was the last time?" "What happened that time?"     no 10. OTHER SYMPTOMS: "Do you have any other symptoms?" (e.g., chest pain, difficulty breathing)       Nausea and vomiting   11. PREGNANCY: "Is there any chance you are pregnant?" "When was your last menstrual period?"       N/a  Protocols used: Leg Swelling and Edema-A-AH

## 2022-05-16 ENCOUNTER — Encounter (INDEPENDENT_AMBULATORY_CARE_PROVIDER_SITE_OTHER): Payer: Self-pay | Admitting: Primary Care

## 2022-05-16 ENCOUNTER — Ambulatory Visit (INDEPENDENT_AMBULATORY_CARE_PROVIDER_SITE_OTHER): Payer: Self-pay | Admitting: Primary Care

## 2022-05-16 VITALS — BP 127/77 | HR 71 | Temp 97.9°F | Ht 66.0 in | Wt 195.0 lb

## 2022-05-16 DIAGNOSIS — Z79899 Other long term (current) drug therapy: Secondary | ICD-10-CM

## 2022-05-16 DIAGNOSIS — D509 Iron deficiency anemia, unspecified: Secondary | ICD-10-CM

## 2022-05-16 DIAGNOSIS — R600 Localized edema: Secondary | ICD-10-CM

## 2022-05-16 DIAGNOSIS — Z7689 Persons encountering health services in other specified circumstances: Secondary | ICD-10-CM

## 2022-05-16 MED ORDER — ONDANSETRON 8 MG PO TBDP
8.0000 mg | ORAL_TABLET | Freq: Three times a day (TID) | ORAL | 1 refills | Status: DC | PRN
Start: 2022-05-16 — End: 2022-07-12

## 2022-05-16 MED ORDER — CHLORTHALIDONE 25 MG PO TABS: 25.0000 mg | ORAL_TABLET | Freq: Every day | ORAL | 1 refills | Status: DC

## 2022-05-16 MED ORDER — FERROUS SULFATE 325 (65 FE) MG PO TABS
325.0000 mg | ORAL_TABLET | Freq: Every day | ORAL | 1 refills | Status: DC
Start: 2022-05-16 — End: 2022-10-07

## 2022-05-16 MED ORDER — SPIRONOLACTONE 50 MG PO TABS: 50.0000 mg | ORAL_TABLET | Freq: Every day | ORAL | 1 refills | Status: DC

## 2022-05-16 NOTE — Patient Instructions (Signed)
Hold potassium tablets normal at discharge. Return in 2 weeks for CMP

## 2022-05-16 NOTE — Progress Notes (Signed)
New Patient Office Visit  Subjective    Patient ID: [name], female    DOB: May 16, 1976  Age: 46 y.o. MRN: [id number]  CC:  Chief Complaint  Patient presents with   New Patient (Initial Visit)    Bilateral LE edema Was seen by amy on 05/05/22     HPI [name] is a 46 year old female presents to establish care. Her major concern is bilateral lower extremity edema and pain.  She has also been experiencing nausea and unable to keep down solid foods but able to drink fluids.  Patient has No headache, No chest pain, No abdominal pain - No new weakness tingling or numbness, No Cough - shortness of breath    Outpatient Encounter Medications as of 05/16/2022  Medication Sig   busPIRone (BUSPAR) 15 MG tablet Take 15 mg by mouth daily as needed (anxiety).   chlorthalidone (HYGROTON) 25 MG tablet Take 1 tablet (25 mg total) by mouth daily.   Cyanocobalamin (B-12 PO) Take 1 tablet by mouth daily.   folic acid (FOLVITE) 1 MG tablet Take 1 tablet (1 mg total) by mouth daily.   loperamide (IMODIUM) 2 MG capsule Take 1 capsule (2 mg total) by mouth as needed for diarrhea or loose stools.   Multiple Vitamin (MULTIVITAMIN) tablet Take 1 tablet by mouth daily.   pantoprazole (PROTONIX) 40 MG tablet Take 1 tablet (40 mg total) by mouth 2 (two) times daily before a meal.   prednisoLONE (PRELONE) 15 MG/5ML SOLN Take 39.9 mg by mouth daily.   sucralfate (CARAFATE) 1 GM/10ML suspension Take 10 mLs (1 g total) by mouth every 6 (six) hours.   [DISCONTINUED] spironolactone (ALDACTONE) 50 MG tablet Take 1 tablet (50 mg total) by mouth daily.   ferrous sulfate 325 (65 FE) MG tablet Take 1 tablet (325 mg total) by mouth daily with breakfast. (Patient not taking: Reported on 05/01/2022)   furosemide (LASIX) 40 MG tablet Take 1 tablet (40 mg total) by mouth daily for 15 days. (Patient not taking: Reported on 05/01/2022)   midodrine (PROAMATINE) 10 MG tablet Take 1 tablet (10 mg total) by mouth 3  (three) times daily with meals. (Patient not taking: Reported on 05/01/2022)   spironolactone (ALDACTONE) 50 MG tablet Take 1 tablet (50 mg total) by mouth daily.   [DISCONTINUED] potassium chloride (KLOR-CON) 10 MEQ tablet Take 1 tablet (10 mEq total) by mouth daily. (Patient not taking: Reported on 05/01/2022)   No facility-administered encounter medications on file as of 05/16/2022.    Past Medical History:  Diagnosis Date   Acid reflux    Anemia    Ulcer     Past Surgical History:  Procedure Laterality Date   ABDOMINAL SURGERY     abdominoplasty   BIOPSY  04/18/2022   Procedure: BIOPSY;  Surgeon: Jackquline Denmark, MD;  Location: [facility] ENDOSCOPY;  Service: Gastroenterology;;   BREAST BIOPSY Right 2013   CHOLECYSTECTOMY N/A 11/23/2019   Procedure: LAPAROSCOPIC CHOLECYSTECTOMY WITH INTRAOPERATIVE CHOLANGIOGRAM;  Surgeon: Mickeal Skinner, MD;  Location: [facility];  Service: General;  Laterality: N/A;   COSMETIC SURGERY     tummy tuck   ESOPHAGOGASTRODUODENOSCOPY (EGD) WITH PROPOFOL N/A 04/18/2022   Procedure: ESOPHAGOGASTRODUODENOSCOPY (EGD) WITH PROPOFOL;  Surgeon: Jackquline Denmark, MD;  Location: [facility];  Service: Gastroenterology;  Laterality: N/A;   HERNIA REPAIR     ROUX-EN-Y PROCEDURE     TUMOR REMOVAL     WRIST GANGLION EXCISION      Family History  Problem Relation Age of Onset   Cancer Mother        kidney   Cancer Paternal Aunt    Breast cancer Maternal Aunt    Pancreatic cancer Maternal Aunt    Lung cancer Paternal Uncle     Social History   Socioeconomic History   Marital status: Single    Spouse name: Not on file   Number of children: 1   Years of education: Not on file   Highest education level: Not on file  Occupational History    Employer: [employer]    Comment: [employer]  Tobacco Use   Smoking status: Every Day    Packs/day: 0.25    Years: 25.00    Total pack years: 6.25    Types: Cigarettes    Passive exposure: Current   Smokeless tobacco: Never  Vaping  Use   Vaping Use: Never used  Substance and Sexual Activity   Alcohol use: Not Currently    Alcohol/week: 12.0 standard drinks of alcohol    Types: 12 Glasses of wine per week   Drug use: No   Sexual activity: Not on file  Other Topics Concern   Not on file  Social History Narrative   Not on file   Social Determinants of Health   Financial Resource Strain: Not on file  Food Insecurity: Not on file  Transportation Needs: Not on file  Physical Activity: Not on file  Stress: Not on file  Social Connections: Not on file  Intimate Partner Violence: Not on file    ROS Comprehensive ROS Pertinent positive and negative noted in HPI     Objective    BP 127/77   Pulse 71   Temp 97.9 F (36.6 C) (Oral)   Ht _0  (1.676 m)   Wt 195 lb (88.5 kg)   LMP 03/10/2022 (Approximate)   SpO2 98%   BMI 31.47 kg/m    Physical exam: General: Vital signs reviewed.  Patient is well-developed and well-nourished, overweight female  in no acute distress and cooperative with exam. Head: Normocephalic and atraumatic. Eyes: EOMI, conjunctivae normal, no scleral icterus. Neck: Supple, trachea midline, normal ROM, no JVD, masses, thyromegaly, or carotid bruit present. Cardiovascular: RRR, S1 normal, S2 normal, no murmurs, gallops, or rubs. Pulmonary/Chest: Clear to auscultation bilaterally, no wheezes, rales, or rhonchi. Abdominal: Soft, non-tender, non-distended, BS +, no masses, organomegaly, or guarding present. Musculoskeletal: No joint deformities, erythema, or stiffness, ROM full and nontender. Extremities: lower extremity edema bilaterally,  pulses symmetric and intact bilaterally. No cyanosis or clubbing. Neurological: A&O x3, Strength is normal Skin: Warm, dry and intact. No rashes or erythema. Psychiatric: Normal mood and affect. speech and behavior is normal. Cognition and memory are normal.      Assessment & Plan:  Leshea was seen today for new patient (initial  visit).  Diagnoses and all orders for this visit:  Bilateral edema of lower extremity   -     spironolactone (ALDACTONE) 50 MG tablet; Take 1 tablet (50 mg total) by mouth daily. -     chlorthalidone (HYGROTON) 25 MG tablet; Take 1 tablet (25 mg total) by mouth daily. -     CMP14+EGFR  Encounter to establish care Establish care with PCP  Iron deficiency anemia, unspecified iron deficiency anemia type The patient has a history of  -     CBC with Differential  Medication management -     CMP14+EGFR -     CBC with Differential  Other orders -  ferrous sulfate 325 (65 FE) MG tablet; Take 1 tablet (325 mg total) by mouth daily with breakfast. -     ondansetron (ZOFRAN-ODT) 8 MG disintegrating tablet; Take 1 tablet (8 mg total) by mouth every 8 (eight) hours as needed for nausea or vomiting.     Kerin Perna, NP

## 2022-05-17 ENCOUNTER — Telehealth (INDEPENDENT_AMBULATORY_CARE_PROVIDER_SITE_OTHER): Payer: Self-pay | Admitting: Primary Care

## 2022-05-17 LAB — CBC WITH DIFFERENTIAL/PLATELET
Basophils Absolute: 0 10*3/uL (ref 0.0–0.2)
Basos: 0 %
EOS (ABSOLUTE): 0.1 10*3/uL (ref 0.0–0.4)
Eos: 0 %
Hematocrit: 27.7 % — ABNORMAL LOW (ref 34.0–46.6)
Hemoglobin: 10 g/dL — ABNORMAL LOW (ref 11.1–15.9)
Immature Grans (Abs): 0 10*3/uL (ref 0.0–0.1)
Immature Granulocytes: 0 %
Lymphocytes Absolute: 1.7 10*3/uL (ref 0.7–3.1)
Lymphs: 13 %
MCH: 33.9 pg — ABNORMAL HIGH (ref 26.6–33.0)
MCHC: 36.1 g/dL — ABNORMAL HIGH (ref 31.5–35.7)
MCV: 94 fL (ref 79–97)
Monocytes Absolute: 0.9 10*3/uL (ref 0.1–0.9)
Monocytes: 6 %
Neutrophils Absolute: 10.8 10*3/uL — ABNORMAL HIGH (ref 1.4–7.0)
Neutrophils: 81 %
Platelets: 135 10*3/uL — ABNORMAL LOW (ref 150–450)
RBC: 2.95 x10E6/uL — ABNORMAL LOW (ref 3.77–5.28)
RDW: 19 % — ABNORMAL HIGH (ref 11.7–15.4)
WBC: 13.5 10*3/uL — ABNORMAL HIGH (ref 3.4–10.8)

## 2022-05-17 LAB — CMP14+EGFR
ALT: 56 IU/L — ABNORMAL HIGH (ref 0–32)
AST: 43 IU/L — ABNORMAL HIGH (ref 0–40)
Albumin/Globulin Ratio: 1 — ABNORMAL LOW (ref 1.2–2.2)
Albumin: 3.7 g/dL — ABNORMAL LOW (ref 3.9–4.9)
Alkaline Phosphatase: 300 IU/L — ABNORMAL HIGH (ref 44–121)
BUN/Creatinine Ratio: 13 (ref 9–23)
BUN: 16 mg/dL (ref 6–24)
Bilirubin Total: 2.9 mg/dL — ABNORMAL HIGH (ref 0.0–1.2)
CO2: 21 mmol/L (ref 20–29)
Calcium: 9.8 mg/dL (ref 8.7–10.2)
Chloride: 102 mmol/L (ref 96–106)
Creatinine, Ser: 1.25 mg/dL — ABNORMAL HIGH (ref 0.57–1.00)
Globulin, Total: 3.6 g/dL (ref 1.5–4.5)
Glucose: 76 mg/dL (ref 70–99)
Potassium: 4 mmol/L (ref 3.5–5.2)
Sodium: 140 mmol/L (ref 134–144)
Total Protein: 7.3 g/dL (ref 6.0–8.5)
eGFR: 55 mL/min/{1.73_m2} — ABNORMAL LOW (ref >59)

## 2022-05-17 NOTE — Telephone Encounter (Unsigned)
Copied from CRM [id number]. Topic: General - Other >> May 17, 2022  3:53 PM Everette C wrote: Reason for CRM: The patient has been in contact with their pharmacy to receive their new prescriptions and has the following concerns  The patient shares that they were previously prescribed spironolactone (ALDACTONE) 50 MG tablet [[id number]]  on 04/29/22 after their hospitalization and they would like to make sure this will cause no issues for them regarding their refills  The patient has also called concerned with the price of their ondansetron (ZOFRAN-ODT) 8 MG disintegrating tablet [[id number]]   The patient would like to be prescribed a cost effective alternative when possible  Please contact the patient further

## 2022-05-17 NOTE — Telephone Encounter (Signed)
Spoke with patient she is aware that medications were sent due to almost being out. Advised to purchase OTC nausea medication as zofran was too expensive.

## 2022-05-18 ENCOUNTER — Telehealth: Payer: Self-pay | Admitting: Gastroenterology

## 2022-05-18 NOTE — Telephone Encounter (Signed)
Chart reviewed: Pt was notified that Dr. Chales Abrahams only wanted pt to have medication Sucralfate  for 2 weeks: Pt verbalized understanding with all questions answered.

## 2022-05-18 NOTE — Telephone Encounter (Signed)
Patient called requesting a refill for Sucralfate. Please advise, thank you.

## 2022-05-23 ENCOUNTER — Ambulatory Visit (INDEPENDENT_AMBULATORY_CARE_PROVIDER_SITE_OTHER): Payer: Self-pay | Admitting: Primary Care

## 2022-05-24 ENCOUNTER — Encounter (HOSPITAL_BASED_OUTPATIENT_CLINIC_OR_DEPARTMENT_OTHER): Payer: Self-pay | Attending: General Surgery | Admitting: General Surgery

## 2022-05-24 DIAGNOSIS — I129 Hypertensive chronic kidney disease with stage 1 through stage 4 chronic kidney disease, or unspecified chronic kidney disease: Secondary | ICD-10-CM | POA: Insufficient documentation

## 2022-05-24 DIAGNOSIS — N189 Chronic kidney disease, unspecified: Secondary | ICD-10-CM | POA: Insufficient documentation

## 2022-05-24 DIAGNOSIS — K7010 Alcoholic hepatitis without ascites: Secondary | ICD-10-CM | POA: Insufficient documentation

## 2022-05-24 DIAGNOSIS — L98412 Non-pressure chronic ulcer of buttock with fat layer exposed: Secondary | ICD-10-CM | POA: Insufficient documentation

## 2022-05-24 DIAGNOSIS — L97522 Non-pressure chronic ulcer of other part of left foot with fat layer exposed: Secondary | ICD-10-CM | POA: Insufficient documentation

## 2022-05-24 DIAGNOSIS — D631 Anemia in chronic kidney disease: Secondary | ICD-10-CM | POA: Insufficient documentation

## 2022-05-24 NOTE — Progress Notes (Signed)
[name], MITTAG ([id number]) Visit Report for 05/24/2022 Chief Complaint Document Details Patient Name: Date of Service: SARA, [name] 05/24/2022 1:30 PM Medical Record Number: [id number] Patient Account Number: [id number] Date of Birth/Sex: Treating RN: May 03, 1976 (46 y.o. F) Primary Care Provider: Juluis Mire Other Clinician: Referring Provider: Treating Provider/Extender: Lincoln Brigham, Amy Weeks in Treatment: 0 Information Obtained from: Patient Chief Complaint Patient seen for complaints of Non-Healing Wounds. Electronic Signature(s) Signed: 05/24/2022 2:53:35 PM By: Fredirick Maudlin MD FACS Entered By: Fredirick Maudlin on 05/24/2022 14:53:35 -------------------------------------------------------------------------------- Debridement Details Patient Name: Date of Service: Lori Hawking R. 05/24/2022 1:30 PM Medical Record Number: [id number] Patient Account Number: [id number] Date of Birth/Sex: Treating RN: 1976/01/18 (46 y.o. [name] Primary Care Provider: Juluis Mire Other Clinician: Referring Provider: Treating Provider/Extender: Lincoln Brigham, Amy Weeks in Treatment: 0 Debridement Performed for Assessment: Wound #1 Left,Dorsal Foot Performed By: Physician Fredirick Maudlin, MD Debridement Type: Debridement Severity of Tissue Pre Debridement: Fat layer exposed Level of Consciousness (Pre-procedure): Awake and Alert Pre-procedure Verification/Time Out Yes - 14:38 Taken: Start Time: 14:38 Pain Control: Lidocaine 5% topical ointment T Area Debrided (L x W): otal 0.9 (cm) x 1 (cm) = 0.9 (cm) Tissue and other material debrided: Non-Viable, Slough, Slough Level: Non-Viable Tissue Debridement Description: Selective/Open Wound Instrument: Curette Bleeding: Minimum Hemostasis Achieved: Pressure Procedural Pain: 8 Post Procedural Pain: 0 Response to Treatment: Procedure was tolerated well Level of Consciousness  (Post- Awake and Alert procedure): Post Debridement Measurements of Total Wound Length: (cm) 0.9 Width: (cm) 1 Depth: (cm) 0.1 Volume: (cm) 0.071 Character of Wound/Ulcer Post Debridement: Improved Severity of Tissue Post Debridement: Fat layer exposed Post Procedure Diagnosis Same as Pre-procedure Electronic Signature(s) Signed: 05/24/2022 3:56:45 PM By: Fredirick Maudlin MD FACS Signed: 05/24/2022 4:43:51 PM By: Adline Peals Entered By: Adline Peals on 05/24/2022 14:39:11 -------------------------------------------------------------------------------- HPI Details Patient Name: Date of Service: Lori Hawking R. 05/24/2022 1:30 PM Medical Record Number: [id number] Patient Account Number: [id number] Date of Birth/Sex: Treating RN: 12-19-1975 (46 y.o. F) Primary Care Provider: Juluis Mire Other Clinician: Referring Provider: Treating Provider/Extender: Lincoln Brigham, Amy Weeks in Treatment: 0 History of Present Illness HPI Description: ADMISSION 05/24/2022 This is a 46 year old woman with a past medical history significant for hypertension, alcoholic hepatitis, chronic anemia, chronic kidney disease, and peptic ulcer disease. She was admitted to the hospital in the middle of July for hypovolemic shock secondary to dehydration from gastroenteritis. She was found on admission to have an ulcer on her left buttock within the confines of the natal cleft and approaching the anal verge. The etiology was felt to be moisture and friction rather than pressure, as the patient is completely ambulatory. No debridement was performed. She was readmitted at the beginning of August with worsening lower extremity edema and fatigue. The wound was basically unchanged. Wound nursing was consulted and they recommended using silver alginate. She was discharged from the hospital with referral to the wound care center. This past weekend, she noted a wound on her dorsal left  foot that she thinks was secondary to friction from shoes. She is here for evaluation of both these wounds. ABI in clinic today was 1.18. She reports that she has quit drinking but she continues to smoke. On the dorsal surface of her left foot between her fourth and fifth metatarsal heads, there is a circular wound exposing the fat layer. There is some slough and eschar accumulation. It does appear consistent with her history of abrasion. On the medial aspect  of her left buttock extending into the natal cleft and towards the anal verge. There is a large ulcer with a very clean surface. There is substantial undermining at the cranial portion of the wound. No purulent drainage or malodor. Electronic Signature(s) Signed: 05/24/2022 2:58:04 PM By: Duanne Guess MD FACS Entered By: Duanne Guess on 05/24/2022 14:58:04 -------------------------------------------------------------------------------- Physical Exam Details Patient Name: Date of Service: Lori Kennedy R. 05/24/2022 1:30 PM Medical Record Number: [id number] Patient Account Number: [id number] Date of Birth/Sex: Treating RN: Apr 16, 1976 (46 y.o. F) Primary Care Provider: Gwinda Passe Other Clinician: Referring Provider: Treating Provider/Extender: Jake Seats, Amy Weeks in Treatment: 0 Constitutional .Tachycardic, asymptomatic.. . . No acute distress. Respiratory Normal work of breathing on room air.. Notes 05/24/2022: On the dorsal surface of her left foot between her fourth and fifth metatarsal heads, there is a circular wound exposing the fat layer. There is some slough and eschar accumulation. It does appear consistent with her history of abrasion. On the medial aspect of her left buttock extending into the natal cleft and towards the anal verge. There is a large ulcer with a very clean surface. There is substantial undermining at the cranial portion of the wound. No purulent drainage or  malodor. Electronic Signature(s) Signed: 05/24/2022 3:06:11 PM By: Duanne Guess MD FACS Previous Signature: 05/24/2022 2:58:12 PM Version By: Duanne Guess MD FACS Entered By: Duanne Guess on 05/24/2022 15:06:11 -------------------------------------------------------------------------------- Physician Orders Details Patient Name: Date of Service: Lori Kennedy R. 05/24/2022 1:30 PM Medical Record Number: [id number] Patient Account Number: [id number] Date of Birth/Sex: Treating RN: August 28, 1976 (46 y.o. Fredderick Phenix Primary Care Provider: Gwinda Passe Other Clinician: Referring Provider: Treating Provider/Extender: Jake Seats, Amy Weeks in Treatment: 0 Verbal / Phone Orders: No Diagnosis Coding ICD-10 Coding Code Description [phone] Non-pressure chronic ulcer of other part of left foot with fat layer exposed L98.412 Non-pressure chronic ulcer of buttock with fat layer exposed N18.9 Chronic kidney disease, unspecified K70.10 Alcoholic hepatitis without ascites Follow-up Appointments ppointment in 2 weeks. - Dr. Lady Gary - room 2 - 9/13 at 3:45 PM Return A Anesthetic Wound #1 Left,Dorsal Foot (In clinic) Topical Lidocaine 5% applied to wound bed Wound #2 Left Gluteal fold (In clinic) Topical Lidocaine 5% applied to wound bed Bathing/ Shower/ Hygiene May shower and wash wound with soap and water. - with dressing changes Edema Control - Lymphedema / SCD / Other Elevate legs to the level of the heart or above for 30 minutes daily and/or when sitting, a frequency of: Avoid standing for long periods of time. Wound Treatment Wound #1 - Foot Wound Laterality: Dorsal, Left Cleanser: Soap and Water Every Other Day/30 Days Discharge Instructions: May shower and wash wound with dial antibacterial soap and water prior to dressing change. Cleanser: Wound Cleanser Every Other Day/30 Days Discharge Instructions: Cleanse the wound with wound cleanser prior  to applying a clean dressing using gauze sponges, not tissue or cotton balls. Prim Dressing: KerraCel Ag Gelling Fiber Dressing, 2x2 in (silver alginate) Every Other Day/30 Days ary Discharge Instructions: Apply silver alginate to wound bed as instructed Secondary Dressing: Woven Gauze Sponge, Non-Sterile 4x4 in Every Other Day/30 Days Discharge Instructions: Apply over primary dressing as directed. Secured With: Elastic Bandage 4 inch (ACE bandage) Every Other Day/30 Days Discharge Instructions: Secure with ACE bandage as directed. Secured With: [employer], 4.5x3.1 (in/yd) Every Other Day/30 Days Discharge Instructions: Secure with Kerlix as directed. Wound #2 - Gluteal fold Wound Laterality: Left Cleanser: Soap and Water  1 x Per Day/30 Days Discharge Instructions: May shower and wash wound with dial antibacterial soap and water prior to dressing change. Cleanser: Wound Cleanser 1 x Per Day/30 Days Discharge Instructions: Cleanse the wound with wound cleanser prior to applying a clean dressing using gauze sponges, not tissue or cotton balls. Prim Dressing: KerraCel Ag Gelling Fiber Dressing, 4x5 in (silver alginate) 1 x Per Day/30 Days ary Discharge Instructions: Apply silver alginate to wound bed as instructed Secondary Dressing: ABD Pad, 5x9 1 x Per Day/30 Days Discharge Instructions: Apply over primary dressing as directed. Secondary Dressing: Woven Gauze Sponge, Non-Sterile 4x4 in 1 x Per Day/30 Days Discharge Instructions: Apply over primary dressing as directed. Patient Medications llergies: adhesive tape A Notifications Medication Indication Start End 05/24/2022 lidocaine DOSE topical 5 % ointment - ointment topical Electronic Signature(s) Signed: 05/24/2022 3:56:45 PM By: Fredirick Maudlin MD FACS Entered By: Fredirick Maudlin on 05/24/2022 15:08:14 -------------------------------------------------------------------------------- Problem List Details Patient Name: Date  of Service: Lori Hawking R. 05/24/2022 1:30 PM Medical Record Number: [id number] Patient Account Number: [id number] Date of Birth/Sex: Treating RN: May 21, 1976 (46 y.o. F) Primary Care Provider: Juluis Mire Other Clinician: Referring Provider: Treating Provider/Extender: Lincoln Brigham, Amy Weeks in Treatment: 0 Active Problems ICD-10 Encounter Code Description Active Date MDM Diagnosis L97.522 Non-pressure chronic ulcer of other part of left foot with fat layer exposed 05/24/2022 No Yes L98.412 Non-pressure chronic ulcer of buttock with fat layer exposed 05/24/2022 No Yes N18.9 Chronic kidney disease, unspecified 05/24/2022 No Yes [id number] Alcoholic hepatitis without ascites 05/24/2022 No Yes Inactive Problems Resolved Problems Electronic Signature(s) Signed: 05/24/2022 2:52:31 PM By: Fredirick Maudlin MD FACS Entered By: Fredirick Maudlin on 05/24/2022 14:52:31 -------------------------------------------------------------------------------- Progress Note Details Patient Name: Date of Service: Lori Hawking R. 05/24/2022 1:30 PM Medical Record Number: [id number] Patient Account Number: [id number] Date of Birth/Sex: Treating RN: [birth year] (46 y.o. F) Primary Care Provider: Juluis Mire Other Clinician: Referring Provider: Treating Provider/Extender: Lincoln Brigham, Amy Weeks in Treatment: 0 Subjective Chief Complaint Information obtained from Patient Patient seen for complaints of Non-Healing Wounds. History of Present Illness (HPI) ADMISSION 05/24/2022 This is a 46 year old woman with a past medical history significant for hypertension, alcoholic hepatitis, chronic anemia, chronic kidney disease, and peptic ulcer disease. She was admitted to the hospital in the middle of July for hypovolemic shock secondary to dehydration from gastroenteritis. She was found on admission to have an ulcer on her left buttock within the confines of the natal  cleft and approaching the anal verge. The etiology was felt to be moisture and friction rather than pressure, as the patient is completely ambulatory. No debridement was performed. She was readmitted at the beginning of August with worsening lower extremity edema and fatigue. The wound was basically unchanged. Wound nursing was consulted and they recommended using silver alginate. She was discharged from the hospital with referral to the wound care center. This past weekend, she noted a wound on her dorsal left foot that she thinks was secondary to friction from shoes. She is here for evaluation of both these wounds. ABI in clinic today was 1.18. She reports that she has quit drinking but she continues to smoke. On the dorsal surface of her left foot between her fourth and fifth metatarsal heads, there is a circular wound exposing the fat layer. There is some slough and eschar accumulation. It does appear consistent with her history of abrasion. On the medial aspect of her left buttock extending into the natal cleft and towards the anal  verge. There is a large ulcer with a very clean surface. There is substantial undermining at the cranial portion of the wound. No purulent drainage or malodor. Patient History Information obtained from Patient. Allergies adhesive tape Family History Cancer - Mother, Diabetes - Mother, Hypertension - Mother,Maternal Grandparents,Paternal Grandparents, No family history of Heart Disease, Hereditary Spherocytosis, Kidney Disease, Lung Disease, Seizures, Stroke, Thyroid Problems, Tuberculosis. Social History Current every day smoker - .5 pack a day, Marital Status - Single, Alcohol Use - Never, Drug Use - No History, Caffeine Use - Daily. Medical History Hematologic/Lymphatic Patient has history of Anemia, Lymphedema Cardiovascular Patient has history of Hypertension, Hypotension Gastrointestinal Patient has history of Cirrhosis Endocrine Denies history of Type  I Diabetes, Type II Diabetes Hospitalization/Surgery History - esophagogastroduodenoscopy. - biopsy. - cholecystectomy. - breast biopsy. - abdominoplasty. - tummy tuck. - hernia repair. - roux-en-y procedure. - tumor removal. - wrist ganglion incision. Medical A Surgical History Notes nd Gastrointestinal slow GI blood loss, alcoholic hepatitis Genitourinary CKD stage III Psychiatric depression Review of Systems (ROS) Constitutional Symptoms (General Health) Denies complaints or symptoms of Fatigue, Fever, Chills, Marked Weight Change. Eyes Complains or has symptoms of Glasses / Contacts - glasses. Respiratory Denies complaints or symptoms of Chronic or frequent coughs, Shortness of Breath. Gastrointestinal Denies complaints or symptoms of Frequent diarrhea, Nausea, Vomiting. Endocrine Denies complaints or symptoms of Heat/cold intolerance. Integumentary (Skin) Complains or has symptoms of Wounds. Musculoskeletal Denies complaints or symptoms of Muscle Pain, Muscle Weakness. Neurologic Denies complaints or symptoms of Numbness/parasthesias. Psychiatric Denies complaints or symptoms of Claustrophobia, Suicidal. Objective Constitutional Tachycardic, asymptomatic.Marland Kitchen No acute distress. Vitals Time Taken: 2:00 PM, Height: 66 in, Source: Stated, Weight: 197 lbs, Source: Stated, BMI: 31.8, Temperature: 98.6 F, Pulse: 117 bpm, Respiratory Rate: 18 breaths/min, Blood Pressure: 112/69 mmHg. Respiratory Normal work of breathing on room air.. General Notes: 05/24/2022: On the dorsal surface of her left foot between her fourth and fifth metatarsal heads, there is a circular wound exposing the fat layer. There is some slough and eschar accumulation. It does appear consistent with her history of abrasion. On the medial aspect of her left buttock extending into the natal cleft and towards the anal verge. There is a large ulcer with a very clean surface. There is substantial undermining at the  cranial portion of the wound. No purulent drainage or malodor. Integumentary (Hair, Skin) Wound #1 status is Open. Original cause of wound was Gradually Appeared. The date acquired was: 05/20/2022. The wound is located on the Left,Dorsal Foot. The wound measures 0.9cm length x 1cm width x 0.1cm depth; 0.707cm^2 area and 0.071cm^3 volume. Wound #2 status is Open. Original cause of wound was Gradually Appeared. The date acquired was: 03/20/2022. The wound is located on the Left Gluteal fold. The wound measures 4.6cm length x 7cm width x 1.3cm depth; 25.29cm^2 area and 32.877cm^3 volume. There is undermining starting at 12:00 and ending at 8:00 with a maximum distance of 2.2cm. 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 There was a Selective/Open Wound Non-Viable Tissue Debridement with a total area of 0.9 sq cm performed by Fredirick Maudlin, MD. With the following instrument(s): Curette to remove Non-Viable tissue/material. Material removed includes [facility]  after achieving pain control using Lidocaine 5% topical ointment. No specimens were taken. A time out was conducted at 14:38, prior to the start of the procedure. A Minimum amount of bleeding was controlled with Pressure. The procedure was tolerated well with a pain level of 8 throughout and a pain level of 0 following the procedure. Post Debridement Measurements: 0.9cm length x 1cm width x 0.1cm depth; 0.071cm^3 volume. Character of Wound/Ulcer Post Debridement is improved. Severity of Tissue Post Debridement is: Fat layer exposed. Post procedure Diagnosis Wound #1: Same as  Pre-Procedure Plan Follow-up Appointments: Return Appointment in 2 weeks. - Dr. Lady Gary - room 2 - 9/13 at 3:45 PM Anesthetic: Wound #1 Left,Dorsal Foot: (In clinic) Topical Lidocaine 5% applied to wound bed Wound #2 Left Gluteal fold: (In clinic) Topical Lidocaine 5% applied to wound bed Bathing/ Shower/ Hygiene: May shower and wash wound with soap and water. - with dressing changes Edema Control - Lymphedema / SCD / Other: Elevate legs to the level of the heart or above for 30 minutes daily and/or when sitting, a frequency of: Avoid standing for long periods of time. The following medication(s) was prescribed: lidocaine topical 5 % ointment ointment topical was prescribed at facility WOUND #1: - Foot Wound Laterality: Dorsal, Left Cleanser: Soap and Water Every Other Day/30 Days Discharge Instructions: May shower and wash wound with dial antibacterial soap and water prior to dressing change. Cleanser: Wound Cleanser Every Other Day/30 Days Discharge Instructions: Cleanse the wound with wound cleanser prior to applying a clean dressing using gauze sponges, not tissue or cotton balls. Prim Dressing: KerraCel Ag Gelling Fiber Dressing, 2x2 in (silver alginate) Every Other Day/30 Days ary Discharge Instructions: Apply silver alginate to wound bed as instructed Secondary Dressing: Woven Gauze Sponge, Non-Sterile 4x4 in Every Other Day/30 Days Discharge Instructions: Apply over primary dressing as directed. Secured With: Elastic Bandage 4 inch (ACE bandage) Every Other Day/30 Days Discharge Instructions: Secure with ACE bandage as directed. Secured With: [employer], 4.5x3.1 (in/yd) Every Other Day/30 Days Discharge Instructions: Secure with Kerlix as directed. WOUND #2: - Gluteal fold Wound Laterality: Left Cleanser: Soap and Water 1 x Per Day/30 Days Discharge Instructions: May shower and wash wound with dial antibacterial soap and water prior to dressing change. Cleanser: Wound  Cleanser 1 x Per Day/30 Days Discharge Instructions: Cleanse the wound with wound cleanser prior to applying a clean dressing using gauze sponges, not tissue or cotton balls. Prim Dressing: KerraCel Ag Gelling Fiber Dressing, 4x5 in (silver alginate) 1 x Per Day/30 Days ary Discharge Instructions: Apply silver alginate to wound bed as instructed Secondary Dressing: ABD Pad, 5x9 1 x Per Day/30 Days Discharge Instructions: Apply over primary dressing as directed. Secondary Dressing: Woven Gauze Sponge, Non-Sterile 4x4 in 1 x Per Day/30 Days Discharge Instructions: Apply over primary dressing as directed. 05/24/2022: This is a 46 year old woman who presents to clinic with a wound on her foot that appears to be secondary to trauma from footwear and a wound on her left medial buttock and into the natal cleft thought to be secondary to moisture and friction. On the dorsal surface of her left foot between her fourth and fifth metatarsal heads, there is a circular wound exposing the fat layer. There is some slough and eschar accumulation. It does appear consistent with her history of abrasion. On the medial aspect of her left buttock extending into the natal cleft and towards the anal verge. There is a large ulcer with a very clean surface.  There is substantial undermining at the cranial portion of the wound. No purulent drainage or malodor. I used a curette to debride slough off of the foot wound. The buttock wound was very clean already. One of the limiting factors with the buttock wound is that it is in a very poor place for treatment such as a wound VAC and the patient is uninsured and therefore cannot get home health. She has been managing the wound herself and it is very clean compared to her hospital admission photos. For now we will continue to use silver alginate to both the foot and the gluteal wound. Follow-up in 2 weeks. Electronic Signature(s) Signed: 05/24/2022 3:09:39 PM By: Fredirick Maudlin  MD FACS Entered By: Fredirick Maudlin on 05/24/2022 15:09:39 -------------------------------------------------------------------------------- HxROS Details Patient Name: Date of Service: Lori Hawking R. 05/24/2022 1:30 PM Medical Record Number: [id number] Patient Account Number: [id number] Date of Birth/Sex: Treating RN: 1976-05-07 (46 y.o. [name] Primary Care Provider: Juluis Mire Other Clinician: Referring Provider: Treating Provider/Extender: Lincoln Brigham, Amy Weeks in Treatment: 0 Information Obtained From Patient Constitutional Symptoms (General Health) Complaints and Symptoms: Negative for: Fatigue; Fever; Chills; Marked Weight Change Eyes Complaints and Symptoms: Positive for: Glasses / Contacts - glasses Respiratory Complaints and Symptoms: Negative for: Chronic or frequent coughs; Shortness of Breath Gastrointestinal Complaints and Symptoms: Negative for: Frequent diarrhea; Nausea; Vomiting Medical History: Positive for: Cirrhosis Past Medical History Notes: slow GI blood loss, alcoholic hepatitis Endocrine Complaints and Symptoms: Negative for: Heat/cold intolerance Medical History: Negative for: Type I Diabetes; Type II Diabetes Integumentary (Skin) Complaints and Symptoms: Positive for: Wounds Musculoskeletal Complaints and Symptoms: Negative for: Muscle Pain; Muscle Weakness Neurologic Complaints and Symptoms: Negative for: Numbness/parasthesias Psychiatric Complaints and Symptoms: Negative for: Claustrophobia; Suicidal Medical History: Past Medical History Notes: depression Hematologic/Lymphatic Medical History: Positive for: Anemia; Lymphedema Cardiovascular Medical History: Positive for: Hypertension; Hypotension Genitourinary Medical History: Past Medical History Notes: CKD stage III Immunological Oncologic Immunizations Pneumococcal Vaccine: Received Pneumococcal Vaccination: No Implantable  Devices None Hospitalization / Surgery History Type of Hospitalization/Surgery esophagogastroduodenoscopy biopsy cholecystectomy breast biopsy abdominoplasty tummy tuck hernia repair roux-en-y procedure tumor removal wrist ganglion incision Family and Social History Cancer: Yes - Mother; Diabetes: Yes - Mother; Heart Disease: No; Hereditary Spherocytosis: No; Hypertension: Yes - Mother,Maternal Grandparents,Paternal Grandparents; Kidney Disease: No; Lung Disease: No; Seizures: No; Stroke: No; Thyroid Problems: No; Tuberculosis: No; Current every day smoker - .5 pack a day; Marital Status - Single; Alcohol Use: Never; Drug Use: No History; Caffeine Use: Daily; Financial Concerns: Yes; Food, Clothing or Shelter Needs: No; Support System Lacking: No; Transportation Concerns: No Engineer, maintenance) Signed: 05/24/2022 3:56:45 PM By: Fredirick Maudlin MD FACS Signed: 05/24/2022 4:43:51 PM By: Sabas Sous By: Adline Peals on 05/24/2022 14:12:01 -------------------------------------------------------------------------------- [place] Details Patient Name: Date of Service: Ernie Hew 05/24/2022 Medical Record Number: [id number] Patient Account Number: [id number] Date of Birth/Sex: Treating RN: 1976-03-21 (46 y.o. 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 N18.9 Chronic kidney disease, unspecified [id number] Alcoholic hepatitis without ascites Facility Procedures CPT4 Code: [id number] Description: 9213 - WOUND CARE VISIT-LEV 3 EST PT Modifier: 25 Quantity: 1 CPT4 Code: [id number] Description: 7597 - DEBRIDE WOUND 1ST 20 SQ CM OR <  ICD-10 Diagnosis Description L97.522  Non-pressure chronic ulcer of other part of left foot with fat layer exposed Modifier: Quantity: 1 Physician Procedures : CPT4 Code Description Modifier [id number] - WC PHYS LEVEL 4 - NEW PT 25 ICD-10 Diagnosis Description L97.522 Non-pressure chronic ulcer of other part of left foot with fat layer exposed L98.412 Non-pressure chronic ulcer of buttock with fat layer  exposed N18.9 Chronic kidney disease, unspecified [id number] Alcoholic hepatitis without ascites Quantity: 1 : [id number] - WC PHYS DEBR WO ANESTH 20 SQ CM ICD-10 Diagnosis Description L97.522 Non-pressure chronic ulcer of other part of left foot with fat layer exposed Quantity: 1 Electronic Signature(s) Signed: 05/24/2022 4:43:51 PM By: Adline Peals Signed: 05/24/2022 4:52:20 PM By: Fredirick Maudlin MD FACS Previous Signature: 05/24/2022 3:10:23 PM Version By: Fredirick Maudlin MD FACS Entered By: Adline Peals on 05/24/2022 16:30:59

## 2022-05-24 NOTE — Progress Notes (Signed)
[name], [name] ([id number]) Visit Report for 05/24/2022 Abuse Risk Screen Details Patient Name: Date of Service: [name], [name] 05/24/2022 1:30 PM Medical Record Number: [id number] Patient Account Number: [id number] Date of Birth/Sex: Treating RN: 04-Dec-1975 (46 y.o. [name] Primary Care [name]: [name] Other Clinician: Referring [name]: Treating [name]/Extender: [name], [name] in Treatment: 0 Abuse Risk Screen Items Answer ABUSE RISK SCREEN: Has anyone close to you tried to hurt or harm you recentlyo No Do you feel uncomfortable with anyone in your familyo No Has anyone forced you do things that you didnt want to doo No Electronic Signature(s) Signed: 05/24/2022 4:43:51 PM By: Samuella Bruin Entered By: Samuella Bruin on 05/24/2022 14:12:10 -------------------------------------------------------------------------------- Activities of Daily Living Details Patient Name: Date of Service: [name] 05/24/2022 1:30 PM Medical Record Number: [id number] Patient Account Number: [id number] Date of Birth/Sex: Treating RN: 07-10-1976 (46 y.o. [name] Primary Care [name]: [name] Other Clinician: Referring [name]: Treating [name]/Extender: [name], [name] in Treatment: 0 Activities of Daily Living Items Answer Activities of Daily Living (Please select one for each item) Drive Automobile Not Able T Medications ake Completely Able Use T elephone Completely Able Care for Appearance Completely Able Use T oilet Completely Able Bath / Shower Completely Able Dress Self Completely Able Feed Self Completely Able Walk Completely Able Get In / Out Bed Completely Able Housework Need Assistance Prepare Meals Need Assistance Handle Money Completely Able Shop for Self Completely Able Electronic Signature(s) Signed: 05/24/2022 4:43:51 PM By: Samuella Bruin Entered  By: Samuella Bruin on 05/24/2022 14:12:53 -------------------------------------------------------------------------------- Education Screening Details Patient Name: Date of Service: Lori Kennedy Lori. 05/24/2022 1:30 PM Medical Record Number: [id number] Patient Account Number: [id number] Date of Birth/Sex: Treating RN: 1976-05-20 (46 y.o. [name] Primary Care [name]: [name] Other Clinician: Referring [name]: Treating [name]/Extender: [name], [name] in Treatment: 0 Primary Learner Assessed: Patient Learning Preferences/Education Level/Primary Language Learning Preference: Explanation, Demonstration, Video, Printed Material Highest Education Level: College or Above Preferred Language: English Cognitive Barrier Language Barrier: No Translator Needed: No Memory Deficit: No Emotional Barrier: No Cultural/Religious Beliefs Affecting Medical Care: No Physical Barrier Impaired Vision: Yes Glasses Impaired Hearing: No Decreased Hand dexterity: No Knowledge/Comprehension Knowledge Level: Medium Comprehension Level: Medium Ability to understand written instructions: Medium Ability to understand verbal instructions: Medium Motivation Anxiety Level: Calm Cooperation: Cooperative Education Importance: Acknowledges Need Interest in Health Problems: Asks Questions Perception: Coherent Willingness to Engage in Self-Management Medium Activities: Readiness to Engage in Self-Management Medium Activities: Electronic Signature(s) Signed: 05/24/2022 4:43:51 PM By: Samuella Bruin Entered By: Samuella Bruin on 05/24/2022 14:13:20 -------------------------------------------------------------------------------- Fall Risk Assessment Details Patient Name: Date of Service: Lori Kennedy Lori. 05/24/2022 1:30 PM Medical Record Number: [id number] Patient Account Number: [id number] Date of Birth/Sex: Treating RN: 1976/06/07 (46 y.o.  [name] Primary Care [name]: [name] Other Clinician: Referring [name]: Treating [name]/Extender: [name], [name] in Treatment: 0 Fall Risk Assessment Items Have you had 2 or more falls in the last 12 monthso 0 Yes Have you had any fall that resulted in injury in the last 12 monthso 0 Yes FALLS RISK SCREEN History of falling - immediate or within 3 months 25 Yes Secondary diagnosis (Do you have 2 or more medical diagnoseso) 15 Yes Ambulatory aid None/bed rest/wheelchair/nurse 0 No Crutches/cane/walker 15 Yes Furniture 0 No Intravenous therapy Access/Saline/Heparin Lock 0 No Gait/Transferring Normal/ bed rest/ wheelchair 0 Yes Weak (short steps with or without shuffle,  stooped but able to lift head while walking, may seek 0 No support from furniture) Impaired (short steps with shuffle, may have difficulty arising from chair, head down, impaired 0 No balance) Mental Status Oriented to own ability 0 Yes Electronic Signature(s) Signed: 05/24/2022 4:43:51 PM By: Samuella Bruin Entered By: Samuella Bruin on 05/24/2022 14:13:55 -------------------------------------------------------------------------------- Foot Assessment Details Patient Name: Date of Service: Lori Kennedy Lori. 05/24/2022 1:30 PM Medical Record Number: [id number] Patient Account Number: [id number] Date of Birth/Sex: Treating RN: [birth year] (46 y.o. [name] Primary Care [name]: [name] Other Clinician: Referring [name]: Treating [name]/Extender: [name], [name] in Treatment: 0 Foot Assessment Items Site Locations + = Sensation present, - = Sensation absent, C = Callus, U = Ulcer Lori = Redness, W = Warmth, M = Maceration, PU = Pre-ulcerative lesion F = Fissure, S = Swelling, D = Dryness Assessment Right: Left: Other Deformity: No No Prior Foot Ulcer: No No Prior Amputation: No No Charcot Joint: No  No Ambulatory Status: Ambulatory With Help Assistance Device: Cane Gait: Steady Electronic Signature(s) Signed: 05/24/2022 4:43:51 PM By: Samuella Bruin Entered By: Samuella Bruin on 05/24/2022 14:15:47 -------------------------------------------------------------------------------- Nutrition Risk Screening Details Patient Name: Date of Service: [name], [name] 05/24/2022 1:30 PM Medical Record Number: [id number] Patient Account Number: [id number] Date of Birth/Sex: Treating RN: August 28, 1976 (46 y.o. [name] Primary Care Payzlee Ryder: [name] Other Clinician: Referring Lillyn Wieczorek: Treating Nils Thor/Extender: [name], [name] in Treatment: 0 Height (in): 66 Weight (lbs): 197 Body Mass Index (BMI): 31.8 Nutrition Risk Screening Items Score Screening NUTRITION RISK SCREEN: I have an illness or condition that made me change the kind and/or amount of food I eat 0 No I eat fewer than two meals per day 0 No I eat few fruits and vegetables, or milk products 0 No I have three or more drinks of beer, liquor or wine almost every day 0 No I have tooth or mouth problems that make it hard for me to eat 0 No I don't always have enough money to buy the food I need 0 No I eat alone most of the time 1 Yes I take three or more different prescribed or over-the-counter drugs a day 1 Yes Without wanting to, I have lost or gained 10 pounds in the last six months 0 No I am not always physically able to shop, cook and/or feed myself 0 No Nutrition Protocols Good Risk Protocol 0 No interventions needed Moderate Risk Protocol High Risk Proctocol Risk Level: Good Risk Score: 2 Electronic Signature(s) Signed: 05/24/2022 4:43:51 PM By: Samuella Bruin Entered By: Samuella Bruin on 05/24/2022 14:14:19

## 2022-05-25 ENCOUNTER — Ambulatory Visit: Payer: Self-pay | Admitting: Cardiology

## 2022-05-26 NOTE — Progress Notes (Signed)
ANETT, RANKER ([id number]) Visit Report for 05/24/2022 Allergy List Details Patient Name: Date of Service: [name], [name] 05/24/2022 1:30 PM Medical Record Number: [id number] Patient Account Number: [id number] Date of Birth/Sex: Treating RN: [birth year] (46 y.o. [name] Primary Care Taiz Bickle: Gwinda Passe Other Clinician: Referring Emerita Berkemeier: Treating Reyan Helle/Extender: Jake Seats, Amy Weeks in Treatment: 0 Allergies Active Allergies adhesive tape Allergy Notes Electronic Signature(s) Signed: 05/24/2022 4:43:51 PM By: Gelene Mink By: Samuella Bruin on 05/24/2022 14:00:53 -------------------------------------------------------------------------------- Arrival Information Details Patient Name: Date of Service: Lori Kennedy R. 05/24/2022 1:30 PM Medical Record Number: [id number] Patient Account Number: [id number] Date of Birth/Sex: Treating RN: Mar 27, 1976 (46 y.o. [name] Primary Care Kiyoto Slomski: Gwinda Passe Other Clinician: Referring Naara Kelty: Treating Keziyah Kneale/Extender: Jake Seats, Amy Weeks in Treatment: 0 Visit Information Patient Arrived: Ambulatory Arrival Time: 13:59 Accompanied By: self Transfer Assistance: None Patient Identification Verified: Yes Secondary Verification Process Completed: Yes Patient Requires Transmission-Based Precautions: No Patient Has Alerts: No Electronic Signature(s) Signed: 05/24/2022 4:43:51 PM By: Samuella Bruin Entered By: Samuella Bruin on 05/24/2022 13:59:58 -------------------------------------------------------------------------------- Clinic Level of Care Assessment Details Patient Name: Date of Service: [name], [name] 05/24/2022 1:30 PM Medical Record Number: [id number] Patient Account Number: [id number] Date of Birth/Sex: Treating RN: January 04, 1976 (46 y.o. [name] Primary Care Sabrina Arriaga: Gwinda Passe Other  Clinician: Referring Teofilo Lupinacci: Treating Lucas Winograd/Extender: Jake Seats, Amy Weeks in Treatment: 0 Clinic Level of Care Assessment Items TOOL 1 Quantity Score X- 1 0 Use when EandM and Procedure is performed on INITIAL visit ASSESSMENTS - Nursing Assessment / Reassessment X- 1 20 General Physical Exam (combine w/ comprehensive assessment (listed just below) when performed on new pt. evals) X- 1 25 Comprehensive Assessment (HX, ROS, Risk Assessments, Wounds Hx, etc.) ASSESSMENTS - Wound and Skin Assessment / Reassessment []  - 0 Dermatologic / Skin Assessment (not related to wound area) ASSESSMENTS - Ostomy and/or Continence Assessment and Care []  - 0 Incontinence Assessment and Management []  - 0 Ostomy Care Assessment and Management (repouching, etc.) PROCESS - Coordination of Care X - Simple Patient / Family Education for ongoing care 1 15 []  - 0 Complex (extensive) Patient / Family Education for ongoing care X- 1 10 Staff obtains , Records, T Results / Process Orders est []  - 0 Staff telephones HHA, Nursing Homes / Clarify orders / etc []  - 0 Routine Transfer to another Facility (non-emergent condition) []  - 0 Routine Hospital Admission (non-emergent condition) X- 1 15 New Admissions / / Ordering NPWT Apligraf, etc. , []  - 0 Emergency Hospital Admission (emergent condition) PROCESS - Special Needs []  - 0 Pediatric / Minor Patient Management []  - 0 Isolation Patient Management []  - 0 Hearing / Language / Visual special needs []  - 0 Assessment of Community assistance (transportation, D/C planning, etc.) []  - 0 Additional assistance / Altered mentation []  - 0 Support Surface(s) Assessment (bed, cushion, seat, etc.) INTERVENTIONS - Miscellaneous []  - 0 External ear exam []  - 0 Patient Transfer (multiple staff / / Similar devices) []  - 0 Simple Staple / Suture removal (25 or less) []  - 0 Complex Staple /  Suture removal (26 or more) []  - 0 Hypo/Hyperglycemic Management (do not check if billed separately) X- 1 15 Ankle / Brachial Index (ABI) - do not check if billed separately Has the patient been seen at the hospital within the last three years: Yes Total Score: 100 Level Of Care: New/Established - Level 3 Electronic Signature(s) Signed: 05/24/2022  4:43:51 PM By: Gelene Mink By: Samuella Bruin on 05/24/2022 16:30:51 -------------------------------------------------------------------------------- Encounter Discharge Information Details Patient Name: Date of Service: Lori Kennedy R. 05/24/2022 1:30 PM Medical Record Number: [id number] Patient Account Number: [id number] Date of Birth/Sex: Treating RN: 05-05-1976 (46 y.o. [name] Primary Care Niyam Bisping: Gwinda Passe Other Clinician: Referring Danielly Ackerley: Treating Deago Burruss/Extender: Jake Seats, Amy Weeks in Treatment: 0 Encounter Discharge Information Items Post Procedure Vitals Discharge Condition: Stable Temperature (F): 98.6 Ambulatory Status: Cane Pulse (bpm): 117 Discharge Destination: Home Respiratory Rate (breaths/min): 18 Transportation: Private Auto Blood Pressure (mmHg): 112/69 Accompanied By: self Schedule Follow-up Appointment: Yes Clinical Summary of Care: Patient Declined Electronic Signature(s) Signed: 05/24/2022 4:43:51 PM By: Samuella Bruin Entered By: Samuella Bruin on 05/24/2022 16:34:09 -------------------------------------------------------------------------------- Lower Extremity Assessment Details Patient Name: Date of Service: Lori Kennedy R. 05/24/2022 1:30 PM Medical Record Number: [id number] Patient Account Number: [id number] Date of Birth/Sex: Treating RN: [birth year] (46 y.o. [name] Primary Care Donshay Lupinski: Gwinda Passe Other Clinician: Referring Aritza Brunet: Treating Teegan Guinther/Extender: Jake Seats,  Amy Weeks in Treatment: 0 Edema Assessment Assessed: [Left: No] [Right: No] E[Left: dema] [Right: :] Calf Left: Right: Point of Measurement: From Medial Instep 38.7 cm Ankle Left: Right: Point of Measurement: From Medial Instep 28.8 cm Vascular Assessment Pulses: Dorsalis Pedis Palpable: [Left:No] Blood Pressure: Brachial: [Left:112] Ankle: [Left:Dorsalis Pedis: 132 1.18] Electronic Signature(s) Signed: 05/24/2022 4:43:51 PM By: Samuella Bruin Entered By: Samuella Bruin on 05/24/2022 14:28:53 -------------------------------------------------------------------------------- Multi Wound Chart Details Patient Name: Date of Service: Lori Kennedy R. 05/24/2022 1:30 PM Medical Record Number: [id number] Patient Account Number: [id number] Date of Birth/Sex: Treating RN: 26-Feb-1976 (46 y.o. F) Primary Care Avalina Benko: Gwinda Passe Other Clinician: Referring Nate Common: Treating Sophiarose Eades/Extender: Jake Seats, Amy Weeks in Treatment: 0 Vital Signs Height(in): 66 Pulse(bpm): 117 Weight(lbs): 197 Blood Pressure(mmHg): 112/69 Body Mass Index(BMI): 31.8 Temperature(F): 98.6 Respiratory Rate(breaths/min): 18 Photos: [N/A:N/A] Left, Dorsal Foot Left Gluteal fold N/A Wound Location: Gradually Appeared Gradually Appeared N/A Wounding Event: Venous Leg Ulcer T be determined o N/A Primary Etiology: N/A Anemia, Lymphedema, Hypertension, N/A Comorbid History: Hypotension, Cirrhosis 05/20/2022 03/20/2022 N/A Date Acquired: 0 0 N/A Weeks of Treatment: Open Open N/A Wound Status: No No N/A Wound Recurrence: 0.9x1x0.1 4.6x7x1.3 N/A Measurements L x W x D (cm) 0.707 25.29 N/A A (cm) : rea 0.071 32.877 N/A Volume (cm) : N/A 0.00% N/A % Reduction in A rea: N/A 0.00% N/A % Reduction in Volume: 12 Starting Position 1 (o'clock): 8 Ending Position 1 (o'clock): 2.2 Maximum Distance 1 (cm): N/A Yes N/A Undermining: Partial Thickness Full  Thickness Without Exposed N/A Classification: Support Structures Debridement - Selective/Open Wound N/A N/A Debridement: Pre-procedure Verification/Time Out 14:38 N/A N/A Taken: Lidocaine 5% topical ointment N/A N/A Pain Control: Slough N/A N/A Tissue Debrided: Non-Viable Tissue N/A N/A Level: 0.9 N/A N/A Debridement A (sq cm): rea Curette N/A N/A Instrument: Minimum N/A N/A Bleeding: Pressure N/A N/A Hemostasis A chieved: 8 N/A N/A Procedural Pain: 0 N/A N/A Post Procedural Pain: Procedure was tolerated well N/A N/A Debridement Treatment Response: 0.9x1x0.1 N/A N/A Post Debridement Measurements L x W x D (cm) 0.071 N/A N/A Post Debridement Volume: (cm) Debridement N/A N/A Procedures Performed: Treatment Notes Electronic Signature(s) Signed: 05/24/2022 2:53:10 PM By: Duanne Guess MD FACS Entered By: Duanne Guess on 05/24/2022 14:53:10 -------------------------------------------------------------------------------- Multi-Disciplinary Care Plan Details Patient Name: Date of Service: Lori Kennedy R. 05/24/2022 1:30 PM Medical Record Number: [id number] Patient Account Number: [id number] Date of Birth/Sex: Treating RN: 1976-04-29 (45 y.o.  [name] Primary Care Rosetta Rupnow: Gwinda Passe Other Clinician: Referring Aneita Kiger: Treating Duaa Stelzner/Extender: Jake Seats, Amy Weeks in Treatment: 0 Active Inactive Pressure Nursing Diagnoses: Knowledge deficit related to causes and risk factors for pressure ulcer development Potential for impaired tissue integrity related to pressure, friction, moisture, and shear Goals: Patient will remain free from development of additional pressure ulcers Date Initiated: 05/24/2022 Target Resolution Date: 06/30/2022 Goal Status: Active Patient/caregiver will verbalize risk factors for pressure ulcer development Date Initiated: 05/24/2022 Target Resolution Date: 06/30/2022 Goal Status:  Active Interventions: Assess: immobility, friction, shearing, incontinence upon admission and as needed Assess potential for pressure ulcer upon admission and as needed Notes: Wound/Skin Impairment Nursing Diagnoses: Impaired tissue integrity Knowledge deficit related to ulceration/compromised skin integrity Goals: Patient will demonstrate a reduced rate of smoking or cessation of smoking Date Initiated: 05/24/2022 Target Resolution Date: 06/30/2022 Goal Status: Active Patient/caregiver will verbalize understanding of skin care regimen Date Initiated: 05/24/2022 Target Resolution Date: 06/30/2022 Goal Status: Active Interventions: Assess patient/caregiver ability to obtain necessary supplies Assess ulceration(s) every visit Treatment Activities: Skin care regimen initiated : 05/24/2022 Topical wound management initiated : 05/24/2022 Notes: Electronic Signature(s) Signed: 05/24/2022 4:43:51 PM By: Samuella Bruin Entered By: Samuella Bruin on 05/24/2022 14:19:10 -------------------------------------------------------------------------------- Pain Assessment Details Patient Name: Date of Service: Lori Kennedy R. 05/24/2022 1:30 PM Medical Record Number: [id number] Patient Account Number: [id number] Date of Birth/Sex: Treating RN: 05/09/1976 (46 y.o. [name] Primary Care Jalila Goodnough: Gwinda Passe Other Clinician: Referring Destane Speas: Treating Kamauri Denardo/Extender: Jake Seats, Amy Weeks in Treatment: 0 Active Problems Location of Pain Severity and Description of Pain Patient Has Paino Yes Site Locations Pain Location: Pain in Ulcers Duration of the Pain. Constant / Intermittento Intermittent Rate the pain. Current Pain Level: 9 Character of Pain Describe the Pain: Burning Pain Management and Medication Current Pain Management: Medication: No Electronic Signature(s) Signed: 05/24/2022 4:43:51 PM By: Samuella Bruin Entered By:  Samuella Bruin on 05/24/2022 14:17:52 -------------------------------------------------------------------------------- Patient/Caregiver Education Details Patient Name: Date of Service: [name] 8/30/2023andnbsp1:30 PM Medical Record Number: [id number] Patient Account Number: [id number] Date of Birth/Gender: Treating RN: November 17, 1975 (46 y.o. [name] Primary Care Physician: Gwinda Passe Other Clinician: Referring Physician: Treating Physician/Extender: Cathrine Muster in Treatment: 0 Education Assessment Education Provided To: Patient Education Topics Provided Pressure: Handouts: Pressure Ulcers: Care and Offloading Methods: Explain/Verbal, Printed Responses: Reinforcements needed, State content correctly Wound/Skin Impairment: Handouts: Caring for Your Ulcer, Skin Care Do's and Dont's, Smoking and Wound Healing Methods: Explain/Verbal, Printed Responses: Reinforcements needed, State content correctly Electronic Signature(s) Signed: 05/24/2022 4:43:51 PM By: Samuella Bruin Entered By: Samuella Bruin on 05/24/2022 14:23:44 -------------------------------------------------------------------------------- Wound Assessment Details Patient Name: Date of Service: Lori Kennedy R. 05/24/2022 1:30 PM Medical Record Number: [id number] Patient Account Number: [id number] Date of Birth/Sex: Treating RN: 1976/06/20 (46 y.o. F) Primary Care Kasaundra Fahrney: Gwinda Passe Other Clinician: Referring Sokhna Christoph: Treating Marilouise Densmore/Extender: Jake Seats, Amy Weeks in Treatment: 0 Wound Status Wound Number: 1 Primary Etiology: Venous Leg Ulcer Wound Location: Left, Dorsal Foot Wound Status: Open Wounding Event: Gradually Appeared Date Acquired: 05/20/2022 Weeks Of Treatment: 0 Clustered Wound: No Photos Wound Measurements Length: (cm) Width: (cm) Depth: (cm) Area: (cm) Volume: (cm) 0.9 % Reduction in  Area: 1 % Reduction in Volume: 0.1 0.707 0.071 Wound Description Classification: Partial Thickness Electronic Signature(s) Signed: 05/26/2022 10:14:34 AM By: Karl Ito Entered By: Karl Ito on 05/24/2022 14:11:52 -------------------------------------------------------------------------------- Wound Assessment Details Patient Name: Date of Service: Lori Kennedy R. 05/24/2022  1:30 PM Medical Record Number: [id number] Patient Account Number: [id number] Date of Birth/Sex: Treating RN: [birth year] (46 y.o. F) Primary Care Leontine Radman: Gwinda Passe Other Clinician: Referring Sabreen Kitchen: Treating Arrionna Serena/Extender: Jake Seats, Amy Weeks in Treatment: 0 Wound Status Wound Number: 2 Primary Etiology: T be determined o Wound Location: Left Gluteal fold Wound Status: Open Wounding Event: Gradually Appeared Comorbid Anemia, Lymphedema, Hypertension, Hypotension, History: Cirrhosis Date Acquired: 03/20/2022 Weeks Of Treatment: 0 Clustered Wound: No Photos Wound Measurements Length: (cm) 4.6 Width: (cm) 7 Depth: (cm) 1.3 Area: (cm) 25.29 Volume: (cm) 32.877 % Reduction in Area: 0% % Reduction in Volume: 0% Undermining: Yes Starting Position (o'clock): 12 Ending Position (o'clock): 8 Maximum Distance: (cm) 2.2 Wound Description Classification: Full Thickness Without Exposed Support Structur es Electronic Signature(s) Signed: 05/26/2022 10:14:34 AM By: Karl Ito Entered By: Karl Ito on 05/24/2022 14:23:32 -------------------------------------------------------------------------------- Vitals Details Patient Name: Date of Service: Lori Kennedy R. 05/24/2022 1:30 PM Medical Record Number: [id number] Patient Account Number: [id number] Date of Birth/Sex: Treating RN: 09-23-1976 (46 y.o. [name] Primary Care Kayston Jodoin: Gwinda Passe Other Clinician: Referring Altheia Shafran: Treating Markese Bloxham/Extender: Jake Seats, Amy Weeks in Treatment: 0 Vital Signs Time Taken: 14:00 Temperature (F): 98.6 Height (in): 66 Pulse (bpm): 117 Source: Stated Respiratory Rate (breaths/min): 18 Weight (lbs): 197 Blood Pressure (mmHg): 112/69 Source: Stated Reference Range: 80 - 120 mg / dl Body Mass Index (BMI): 31.8 Electronic Signature(s) Signed: 05/24/2022 4:43:51 PM By: Samuella Bruin Entered By: Samuella Bruin on 05/24/2022 14:02:27

## 2022-05-30 ENCOUNTER — Encounter: Payer: Self-pay | Admitting: Cardiovascular Disease

## 2022-05-30 ENCOUNTER — Ambulatory Visit: Payer: Self-pay | Attending: Cardiology | Admitting: Cardiovascular Disease

## 2022-05-30 VITALS — BP 104/66 | HR 123 | Ht 66.0 in | Wt 189.6 lb

## 2022-05-30 DIAGNOSIS — R600 Localized edema: Secondary | ICD-10-CM

## 2022-05-30 DIAGNOSIS — K7030 Alcoholic cirrhosis of liver without ascites: Secondary | ICD-10-CM

## 2022-05-30 DIAGNOSIS — I959 Hypotension, unspecified: Secondary | ICD-10-CM

## 2022-05-30 NOTE — Progress Notes (Signed)
Cardiology Office Note:    Date:  05/30/2022   ID:  [name], DOB 05-Oct-1975, MRN [id number]  PCP:  [name], NP   [facility] Providers Cardiologist:  Jennyfer Nickolson   Referring MD: Rema Fendt, NP   Chief Complaint  Patient presents with   Hypotension     History of Present Illness:    [name] is a 46 y.o. 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  Prior to that she was drinking half a pint of vodka a day.  She has had lots of issues with leg swelling.  Her echocardiogram reveals normal left ventricular systolic and diastolic function.  Has progressive leg swelling  - is on spironolactone and chlorthalidone  Has taken lasix in the past After being diuresed, she was started on midodrine 10 mg TID while in the hospital.  Has alcoholic cirrohsis  Stopped drinking alcohol 2 months ago - was drinking  1/2 pint of vodka a day   LV function is supranormal.  Her LVEF is 75%.  Diastolic function is normal. She has trivial mitral regurgitation.    Past Medical History:  Diagnosis Date   Acid reflux    Anemia    Ulcer     Past Surgical History:  Procedure Laterality Date   ABDOMINAL SURGERY     abdominoplasty   BIOPSY  04/18/2022   Procedure: BIOPSY;  Surgeon: Lynann Bologna, MD;  Location: [facility] ENDOSCOPY;  Service: Gastroenterology;;   BREAST BIOPSY Right 2013   CHOLECYSTECTOMY N/A 11/23/2019   Procedure: LAPAROSCOPIC CHOLECYSTECTOMY WITH INTRAOPERATIVE CHOLANGIOGRAM;  Surgeon: Rodman Pickle, MD;  Location: [facility] OR;  Service: General;  Laterality: N/A;   COSMETIC SURGERY     tummy tuck   ESOPHAGOGASTRODUODENOSCOPY (EGD) WITH PROPOFOL N/A 04/18/2022   Procedure: ESOPHAGOGASTRODUODENOSCOPY (EGD) WITH PROPOFOL;  Surgeon: Lynann Bologna, MD;  Location: [facility] ENDOSCOPY;  Service: Gastroenterology;  Laterality: N/A;   HERNIA REPAIR     ROUX-EN-Y  PROCEDURE     TUMOR REMOVAL     WRIST GANGLION EXCISION      Current Medications: Current Meds  Medication Sig   busPIRone (BUSPAR) 15 MG tablet Take 15 mg by mouth daily as needed (anxiety).   chlorthalidone (HYGROTON) 25 MG tablet Take 1 tablet (25 mg total) by mouth daily.   Cyanocobalamin (B-12 PO) Take 1 tablet by mouth daily.   ferrous sulfate 325 (65 FE) MG tablet Take 1 tablet (325 mg total) by mouth daily with breakfast.   folic acid (FOLVITE) 1 MG tablet Take 1 tablet (1 mg total) by mouth daily.   loperamide (IMODIUM) 2 MG capsule Take 1 capsule (2 mg total) by mouth as needed for diarrhea or loose stools.   midodrine (PROAMATINE) 10 MG tablet Take 1 tablet (10 mg total) by mouth 3 (three) times daily with meals.   Multiple Vitamin (MULTIVITAMIN) tablet Take 1 tablet by mouth daily.   ondansetron (ZOFRAN-ODT) 8 MG disintegrating tablet Take 1 tablet (8 mg total) by mouth every 8 (eight) hours as needed for nausea or vomiting.   pantoprazole (PROTONIX) 40 MG tablet Take 1 tablet (40 mg total) by mouth 2 (two) times daily before a meal.   spironolactone (ALDACTONE) 50 MG tablet Take 1 tablet (50 mg total) by mouth daily.     Allergies:   Tape   Social History   Socioeconomic History   Marital status: Single  Spouse name: Not on file   Number of children: 1   Years of education: Not on file   Highest education level: Not on file  Occupational History    Employer: [employer]    Comment: [employer]  Tobacco Use   Smoking status: Every Day    Packs/day: 0.25    Years: 25.00    Total pack years: 6.25    Types: Cigarettes    Passive exposure: Current   Smokeless tobacco: Never  Vaping Use   Vaping Use: Never used  Substance and Sexual Activity   Alcohol use: Not Currently    Alcohol/week: 12.0 standard drinks of alcohol    Types: 12 Glasses of wine per week   Drug use: No   Sexual activity: Not on file  Other Topics Concern   Not on file  Social History Narrative   Not  on file   Social Determinants of Health   Financial Resource Strain: Not on file  Food Insecurity: Not on file  Transportation Needs: Not on file  Physical Activity: Not on file  Stress: Not on file  Social Connections: Not on file     Family History: The patient's family history includes Breast cancer in her maternal aunt; Cancer in her mother and paternal aunt; Lung cancer in her paternal uncle; Pancreatic cancer in her maternal aunt.  ROS:   Please see the history of present illness.     All other systems reviewed and are negative.  EKGs/Labs/Other Studies Reviewed:    The following studies were reviewed today:   EKG:   Recent Labs: 04/29/2022: B Natriuretic Peptide 967.2 05/03/2022: Magnesium 1.9 05/16/2022: ALT 56; BUN 16; Creatinine, Ser 1.25; Hemoglobin 10.0; Platelets 135; Potassium 4.0; Sodium 140  Recent Lipid Panel    Component Value Date/Time   TRIG 191 (H) 04/10/2022 1637     Risk Assessment/Calculations:                Physical Exam:    VS:  BP 104/66   Pulse (!) 123   Ht 5\' 6"  (1.676 m)   Wt 189 lb 9.6 oz (86 kg)   SpO2 99%   BMI 30.60 kg/m     Wt Readings from Last 3 Encounters:  05/30/22 189 lb 9.6 oz (86 kg)  05/16/22 195 lb (88.5 kg)  05/05/22 204 lb (92.5 kg)     GEN:  Well nourished, well developed in no acute distress HEENT: Normal NECK: No JVD; No carotid bruits LYMPHATICS: No lymphadenopathy CARDIAC: RRR, no murmurs, rubs, gallops RESPIRATORY:  Clear to auscultation without rales, wheezing or rhonchi  ABDOMEN: Soft, non-tender, non-distended MUSCULOSKELETAL:  No edema; No deformity  SKIN: Warm and dry NEUROLOGIC:  Alert and oriented x 3 PSYCHIATRIC:  Normal affect   ASSESSMENT:    1. Hypotension, unspecified hypotension type   2. Bilateral leg edema   3. Alcoholic cirrhosis of liver without ascites (HCC)    PLAN:    In order of problems listed above:  Hypotension: The patient has normal left ventricular systolic and  diastolic function.  She has cirrhosis of the liver and as a result has significant leg edema.  The patient was started on spironolactone and chlorthalidone for leg edema but this caused her to be hypotensive.  She has been started on midodrine 10 mg 3 times a day for hypotension which seems to have helped quite a bit.  This is not really a true cardiac issue.  Her LV function is completely normal.  Her  renal function is normal as of now.  The leg swelling is due to the cirrhosis of the liver and diuresis to the point where she does not have any leg edema will cause her filling pressures to fall and will cause hypotension.  My suggestion is that she elevate her legs is much as possible.  She should wear compression hose. At present she is on a low-dose diuretic and I think she would do okay to continue with the current dose of spironolactone and chlorthalidone.  I would not add furosemide at this time.  Continue midodrine at the current dose.  We will have her return to be managed by her primary medical doctor.  We will see her on an as-needed basis.             Medication Adjustments/Labs and Tests Ordered: Current medicines are reviewed at length with the patient today.  Concerns regarding medicines are outlined above.  Orders Placed This Encounter  Procedures   VAS [place] LOWER EXTREMITY ARTERIAL DUPLEX   No orders of the defined types were placed in this encounter.   Patient Instructions  Medication Instructions:  Your physician recommends that you continue on your current medications as directed. Please refer to the Current Medication list given to you today.  *If you need a refill on your cardiac medications before your next appointment, please call your pharmacy*   Lab Work: NONE If you have labs (blood work) drawn today and your tests are completely normal, you will receive your results only by: MyChart Message (if you have MyChart) OR A paper copy in the mail If you  have any lab test that is abnormal or we need to change your treatment, we will call you to review the results.  Testing/Procedures: Lower Extremity Ultrasound Your physician has requested that you have a lower or upper extremity arterial duplex. This test is an ultrasound of the arteries in the legs or arms. It looks at arterial blood flow in the legs and arms. Allow one hour for Lower and Upper Arterial scans. There are no restrictions or special instructions  Follow-Up: At [facility], you and your health needs are our priority.  As part of our continuing mission to provide you with exceptional heart care, we have created designated Provider Care Teams.  These Care Teams include your primary Cardiologist (physician) and Advanced Practice Providers (APPs -  Physician Assistants and Nurse Practitioners) who all work together to provide you with the care you need, when you need it.  We recommend signing up for the patient portal called "MyChart".  Sign up information is provided on this After Visit Summary.  MyChart is used to connect with patients for Virtual Visits (Telemedicine).  Patients are able to view lab/test results, encounter notes, upcoming appointments, etc.  Non-urgent messages can be sent to your provider as well.   To learn more about what you can do with MyChart, go to [email or web address].    Your next appointment:   As Needed  The format for your next appointment:   In Person  Provider:   Kristeen Miss, MD       Important Information About Sugar         Signed, Kristeen Miss, MD  05/30/2022 5:40 PM    [facility]

## 2022-05-30 NOTE — Patient Instructions (Signed)
Medication Instructions:  Your physician recommends that you continue on your current medications as directed. Please refer to the Current Medication list given to you today.  *If you need a refill on your cardiac medications before your next appointment, please call your pharmacy*   Lab Work: NONE If you have labs (blood work) drawn today and your tests are completely normal, you will receive your results only by: MyChart Message (if you have MyChart) OR A paper copy in the mail If you have any lab test that is abnormal or we need to change your treatment, we will call you to review the results.  Testing/Procedures: Lower Extremity Ultrasound Your physician has requested that you have a lower or upper extremity arterial duplex. This test is an ultrasound of the arteries in the legs or arms. It looks at arterial blood flow in the legs and arms. Allow one hour for Lower and Upper Arterial scans. There are no restrictions or special instructions  Follow-Up: At [facility], you and your health needs are our priority.  As part of our continuing mission to provide you with exceptional heart care, we have created designated Provider Care Teams.  These Care Teams include your primary Cardiologist (physician) and Advanced Practice Providers (APPs -  Physician Assistants and Nurse Practitioners) who all work together to provide you with the care you need, when you need it.  We recommend signing up for the patient portal called "MyChart".  Sign up information is provided on this After Visit Summary.  MyChart is used to connect with patients for Virtual Visits (Telemedicine).  Patients are able to view lab/test results, encounter notes, upcoming appointments, etc.  Non-urgent messages can be sent to your provider as well.   To learn more about what you can do with MyChart, go to [email or web address].    Your next appointment:   As Needed  The format for your next appointment:   In  Person  Provider:   Kristeen Miss, MD       Important Information About Sugar

## 2022-06-05 ENCOUNTER — Telehealth (INDEPENDENT_AMBULATORY_CARE_PROVIDER_SITE_OTHER): Payer: Self-pay | Admitting: Primary Care

## 2022-06-05 ENCOUNTER — Ambulatory Visit (HOSPITAL_COMMUNITY)
Admission: RE | Admit: 2022-06-05 | Discharge: 2022-06-05 | Disposition: A | Discharge status: Home / Self Care | Payer: Self-pay | Source: Ambulatory Visit | Attending: Internal Medicine | Admitting: Internal Medicine

## 2022-06-05 ENCOUNTER — Ambulatory Visit (HOSPITAL_COMMUNITY)
Admission: RE | Admit: 2022-06-05 | Payer: Self-pay | Source: Ambulatory Visit | Attending: Cardiovascular Disease | Admitting: Cardiovascular Disease

## 2022-06-05 DIAGNOSIS — R600 Localized edema: Secondary | ICD-10-CM | POA: Insufficient documentation

## 2022-06-05 NOTE — Telephone Encounter (Signed)
Copied from CRM [id number]. Topic: General - Inquiry >> Jun 02, 2022  1:43 PM De Blanch wrote: Reason for CRM: Agent from [employer] stated August 30th, at 4 p.m., they faxed disability forms that need to be filled out by the PCP.   Forms will be re-faxed today.   REF# [id number]  Callback: [phone] / Fax: [phone]   *Paperwork received via fax and given to CMA for [name]

## 2022-06-05 NOTE — Telephone Encounter (Signed)
Paperwork received and will be discussed during ov on 9/13 

## 2022-06-05 NOTE — Telephone Encounter (Signed)
Copied from CRM [id number]. Topic: General - Inquiry >> Jun 02, 2022  1:43 PM De Blanch wrote: Reason for CRM: Agent from [employer] stated August 30th,at 4 p.m., they faxed disability forms that need to be filled out by the PCP.  Forms will be re-faxed today.  REF# [id number]  Callback: [phone] / Fax: [phone]

## 2022-06-05 NOTE — Telephone Encounter (Signed)
Paperwork received and will be discussed during ov on 9/13

## 2022-06-06 ENCOUNTER — Telehealth: Payer: Self-pay | Admitting: Family

## 2022-06-06 NOTE — Telephone Encounter (Signed)
Copied from CRM [id number]. Topic: General - Other >> Jun 06, 2022 11:34 AM Franchot Heidelberg wrote: Reason for CRM: Pt called reporting that she discussed getting some kind of boot with Ricky Stabs at [facility] during her visit last month, pt is asking for follow up on that. Please advise.   [phone]

## 2022-06-06 NOTE — Telephone Encounter (Signed)
Pt will need to discuss at appt w/provider on 09/13

## 2022-06-07 ENCOUNTER — Encounter (INDEPENDENT_AMBULATORY_CARE_PROVIDER_SITE_OTHER): Payer: Self-pay | Admitting: Primary Care

## 2022-06-07 ENCOUNTER — Ambulatory Visit (INDEPENDENT_AMBULATORY_CARE_PROVIDER_SITE_OTHER): Payer: Self-pay | Admitting: Primary Care

## 2022-06-07 ENCOUNTER — Encounter (HOSPITAL_BASED_OUTPATIENT_CLINIC_OR_DEPARTMENT_OTHER): Payer: Self-pay | Admitting: General Surgery

## 2022-06-07 ENCOUNTER — Other Ambulatory Visit (INDEPENDENT_AMBULATORY_CARE_PROVIDER_SITE_OTHER): Payer: Self-pay | Admitting: Primary Care

## 2022-06-07 VITALS — BP 104/70 | HR 117 | Resp 16 | Wt 185.8 lb

## 2022-06-07 DIAGNOSIS — R600 Localized edema: Secondary | ICD-10-CM

## 2022-06-07 NOTE — Progress Notes (Signed)
    [facility]        Subjective:    MAR ZETTLER is a 46 y.o. female who presents for evaluation of a possible skin infection located bilateral lower extremities . Symptoms include severe pain and erythema located below knees to feet . Patient denies chills and fever greater than 100. Precipitating event: none known. Treatment to date has included elevation of the area and warm compresses with no relief.  The following portions of the patient's history were reviewed and updated as appropriate: allergies, current medications, past family history, past medical history, past social history, and past surgical history.  Review of Systems Pertinent items noted in HPI and remainder of comprehensive ROS otherwise negative.     Objective:  BP 104/70   Pulse (!) 117   Resp 16   Wt 185 lb 12.8 oz (84.3 kg)   SpO2 100%   BMI 29.99 kg/m   Physical exam: General: Vital signs reviewed.  Patient is well-developed and well-nourished, overweight female in no acute distress and cooperative with exam. Head: Normocephalic and atraumatic. Eyes: EOMI, conjunctivae normal, no scleral icterus. Neck: Supple, trachea midline, normal ROM, no JVD, masses, thyromegaly, or carotid bruit present. Cardiovascular: RRR, S1 normal, S2 normal, no murmurs, gallops, or rubs. Pulmonary/Chest: Clear to auscultation bilaterally, no wheezes, rales, or rhonchi. Abdominal: Soft, non-tender, non-distended, BS +, no masses, organomegaly, or guarding present. Musculoskeletal: No joint deformities, erythema, or stiffness, ROM full and nontender. Extremities: extremity edema bilaterally, at this exam, pulses symmetric and intact bilaterally.  Neurological: A&O x3, Strength is normal Skin: Warm, dry and intact. No rashes or erythema. Psychiatric: Normal mood and affect. speech and behavior is normal. Cognition and memory are normal.       Assessment:  Tametra was seen today for leg  swelling.  Diagnoses and all orders for this visit:  Bilateral edema of lower extremity There is no improvement from previous visit we will bilateral lower extremity edema.  She had an appointment today with the wound care center and canceled it to come to PCP for management.  Call wound center to see if they could still see her today it was still in the range of her appointment.  They agreed but she had to come nail because the provider would be leaving early.  Explained this to patient.  This is when I found out that she was dropped off and no transportation to get to the wound center in a reasonable amount of time.  Call placed back to the wound center requesting an appointment for the following day.  Appointment was given patient was told time.   This note has been created with Education officer, environmental. Any transcriptional errors are unintentional.   [name], NP 06/07/2022, 2:36 PM

## 2022-06-08 ENCOUNTER — Encounter (HOSPITAL_BASED_OUTPATIENT_CLINIC_OR_DEPARTMENT_OTHER): Payer: Self-pay | Attending: General Surgery | Admitting: General Surgery

## 2022-06-08 DIAGNOSIS — L97522 Non-pressure chronic ulcer of other part of left foot with fat layer exposed: Secondary | ICD-10-CM | POA: Insufficient documentation

## 2022-06-08 DIAGNOSIS — N183 Chronic kidney disease, stage 3 unspecified: Secondary | ICD-10-CM | POA: Insufficient documentation

## 2022-06-08 DIAGNOSIS — K7460 Unspecified cirrhosis of liver: Secondary | ICD-10-CM | POA: Insufficient documentation

## 2022-06-08 DIAGNOSIS — K7010 Alcoholic hepatitis without ascites: Secondary | ICD-10-CM | POA: Insufficient documentation

## 2022-06-08 DIAGNOSIS — F17210 Nicotine dependence, cigarettes, uncomplicated: Secondary | ICD-10-CM | POA: Insufficient documentation

## 2022-06-08 DIAGNOSIS — L98412 Non-pressure chronic ulcer of buttock with fat layer exposed: Secondary | ICD-10-CM | POA: Insufficient documentation

## 2022-06-08 DIAGNOSIS — I129 Hypertensive chronic kidney disease with stage 1 through stage 4 chronic kidney disease, or unspecified chronic kidney disease: Secondary | ICD-10-CM | POA: Insufficient documentation

## 2022-06-09 NOTE — Progress Notes (Signed)
[name], [name] ([id number]) Visit Report for 06/08/2022 Chief Complaint Document Details Patient Name: Date of Service: [name], [name] 06/08/2022 3:00 PM Medical Record Number: [id number] Patient Account Number: [id number] Date of Birth/Sex: Treating RN: December 11, 1975 (46 y.o. [name] Primary Care Provider: Gwinda Passe Other Clinician: Referring Provider: Treating Provider/Extender: Sharyl Nimrod in Treatment: 2 Information Obtained from: Patient Chief Complaint Patient seen for complaints of Non-Healing Wounds. Electronic Signature(s) Signed: 06/08/2022 3:52:00 PM By: Duanne Guess MD FACS Entered By: Duanne Guess on 06/08/2022 15:52:00 -------------------------------------------------------------------------------- Debridement Details Patient Name: Date of Service: Lori Kennedy R. 06/08/2022 3:00 PM Medical Record Number: [id number] Patient Account Number: [id number] Date of Birth/Sex: Treating RN: [birth year] (46 y.o. [name], [name] Primary Care Provider: Gwinda Passe Other Clinician: Referring Provider: Treating Provider/Extender: Sharyl Nimrod in Treatment: 2 Debridement Performed for Assessment: Wound #1 Left,Dorsal Foot Performed By: Physician Duanne Guess, MD Debridement Type: Debridement Level of Consciousness (Pre-procedure): Awake and Alert Pre-procedure Verification/Time Out Yes - 15:40 Taken: Start Time: 15:41 Pain Control: Lidocaine 5% topical ointment T Area Debrided (L x W): otal 1 (cm) x 1.4 (cm) = 1.4 (cm) Tissue and other material debrided: Non-Viable, Fat, Slough, Slough Level: Skin/Subcutaneous Tissue Debridement Description: Excisional Instrument: Curette, Forceps, Scissors Bleeding: Minimum Hemostasis Achieved: Pressure Procedural Pain: 10 Post Procedural Pain: 0 Response to Treatment: Procedure was tolerated well Level of Consciousness (Post- Awake  and Alert procedure): Post Debridement Measurements of Total Wound Length: (cm) 1 Width: (cm) 1.4 Depth: (cm) 0.4 Volume: (cm) 0.44 Character of Wound/Ulcer Post Debridement: Requires Further Debridement Post Procedure Diagnosis Same as Pre-procedure Notes Scribed for Dr. Lady Gary by Tommie Ard, RN Electronic Signature(s) Signed: 06/09/2022 10:03:44 AM By: Duanne Guess MD FACS Signed: 06/09/2022 5:03:05 PM By: Tommie Ard RN Previous Signature: 06/08/2022 4:07:57 PM Version By: Duanne Guess MD FACS Entered By: Tommie Ard on 06/08/2022 16:52:34 -------------------------------------------------------------------------------- Debridement Details Patient Name: Date of Service: Lori Kennedy R. 06/08/2022 3:00 PM Medical Record Number: [id number] Patient Account Number: [id number] Date of Birth/Sex: Treating RN: May 27, 1976 (46 y.o. [name], [name] Primary Care Provider: Gwinda Passe Other Clinician: Referring Provider: Treating Provider/Extender: Sharyl Nimrod in Treatment: 2 Debridement Performed for Assessment: Wound #2 Left Gluteal fold Performed By: Physician Duanne Guess, MD Debridement Type: Debridement Level of Consciousness (Pre-procedure): Awake and Alert Pre-procedure Verification/Time Out Yes - 15:40 Taken: Start Time: 15:41 Pain Control: Lidocaine 5% topical ointment T Area Debrided (L x W): otal 6 (cm) x 7 (cm) = 42 (cm) Tissue and other material debrided: Non-Viable, Slough, Biofilm, Slough Level: Non-Viable Tissue Debridement Description: Selective/Open Wound Instrument: Curette Bleeding: Minimum Hemostasis Achieved: Pressure Procedural Pain: 0 Post Procedural Pain: 0 Response to Treatment: Procedure was tolerated well Level of Consciousness (Post- Awake and Alert procedure): Post Debridement Measurements of Total Wound Length: (cm) 6 Width: (cm) 7 Depth: (cm) 0.1 Volume: (cm) 3.299 Character of  Wound/Ulcer Post Debridement: Requires Further Debridement Post Procedure Diagnosis Same as Pre-procedure Notes Scribed for Dr. Lady Gary by Tommie Ard, RN Electronic Signature(s) Signed: 06/09/2022 10:03:44 AM By: Duanne Guess MD FACS Signed: 06/09/2022 5:03:05 PM By: Tommie Ard RN Previous Signature: 06/08/2022 4:07:57 PM Version By: Duanne Guess MD FACS Entered By: Tommie Ard on 06/08/2022 16:52:46 -------------------------------------------------------------------------------- HPI Details Patient Name: Date of Service: Lori Kennedy R. 06/08/2022 3:00 PM Medical Record Number: [id number] Patient Account Number: [id number] Date of Birth/Sex: Treating RN: 1976-07-27 (46 y.o. [name] Primary Care Provider: Gwinda Passe Other Clinician:  Referring Provider: Treating Provider/Extender: Patrici Ranks in Treatment: 2 History of Present Illness HPI Description: ADMISSION 05/24/2022 This is a 46 year old woman with a past medical history significant for hypertension, alcoholic hepatitis, chronic anemia, chronic kidney disease, and peptic ulcer disease. She was admitted to the hospital in the middle of July for hypovolemic shock secondary to dehydration from gastroenteritis. She was found on admission to have an ulcer on her left buttock within the confines of the natal cleft and approaching the anal verge. The etiology was felt to be moisture and friction rather than pressure, as the patient is completely ambulatory. No debridement was performed. She was readmitted at the beginning of August with worsening lower extremity edema and fatigue. The wound was basically unchanged. Wound nursing was consulted and they recommended using silver alginate. She was discharged from the hospital with referral to the wound care center. This past weekend, she noted a wound on her dorsal left foot that she thinks was secondary to friction from shoes.  She is here for evaluation of both these wounds. ABI in clinic today was 1.18. She reports that she has quit drinking but she continues to smoke. On the dorsal surface of her left foot between her fourth and fifth metatarsal heads, there is a circular wound exposing the fat layer. There is some slough and eschar accumulation. It does appear consistent with her history of abrasion. On the medial aspect of her left buttock extending into the natal cleft and towards the anal verge. There is a large ulcer with a very clean surface. There is substantial undermining at the cranial portion of the wound. No purulent drainage or malodor. 06/08/2022: The patient returns today with significant worsening of her dorsal foot wound. Apparently she saw her PCP yesterday who was concerned for cellulitis but did not prescribe any antibiotics. She was also sent for a DVT scan which was negative. There is heavy slough as well as liquefactive necrosis of the fat layer. There is a bulla between the second and third toes that looks as though it may rupture in the near future. The wound undermines under the skin towards this bulla. No frankly purulent drainage or any odor. Her gluteal wound is very clean but is relatively unchanged in size. She has been managing her wounds on her own and finds it difficult to pack the silver alginate into the undermined portion of the buttock wound. She says that she has been putting peroxide on her foot. Electronic Signature(s) Signed: 06/08/2022 3:54:34 PM By: Lori Maudlin MD FACS Entered By: [name] on 06/08/2022 15:54:34 -------------------------------------------------------------------------------- Physical Exam Details Patient Name: Date of Service: Lori Hawking R. 06/08/2022 3:00 PM Medical Record Number: [id number] Patient Account Number: [id number] Date of Birth/Sex: Treating RN: 09/20/1976 (46 y.o. [name] Primary Care Provider: Juluis Mire  Other Clinician: Referring Provider: Treating Provider/Extender: Patrici Ranks in Treatment: 2 Constitutional .Tachycardic, asymptomatic.. . . No acute distress.Marland Kitchen Respiratory Normal work of breathing on room air.. Notes 06/08/2022: The dorsal foot wound is significantly worse today. There is heavy slough as well as liquefactive necrosis of the fat layer. There is a bulla between the second and third toes that looks as though it may rupture in the near future. The wound undermines under the skin towards this bulla. No frankly purulent drainage or any odor. Her gluteal wound is very clean but is relatively unchanged in size. Electronic Signature(s) Signed: 06/08/2022 3:55:24 PM By: Lori Maudlin MD FACS Entered By:  [name] on 06/08/2022 15:55:24 -------------------------------------------------------------------------------- Physician Orders Details Patient Name: Date of Service: QUANEISHA, APOLLO 06/08/2022 3:00 PM Medical Record Number: [id number] Patient Account Number: [id number] Date of Birth/Sex: Treating RN: 02/15/1976 (46 y.o. Iver Nestle, [name] Primary Care Provider: Juluis Mire Other Clinician: Referring Provider: Treating Provider/Extender: Patrici Ranks in Treatment: 2 Verbal / Phone Orders: No Diagnosis Coding ICD-10 Coding Code Description [phone] Non-pressure chronic ulcer of other part of left foot with fat layer exposed L98.412 Non-pressure chronic ulcer of buttock with fat layer exposed N18.9 Chronic kidney disease, unspecified 123XX123 Alcoholic hepatitis without ascites Follow-up Appointments ppointment in 1 week. - Dr. Celine Ahr Rm 2 Return A Anesthetic Wound #1 Left,Dorsal Foot (In clinic) Topical Lidocaine 5% applied to wound bed Wound #2 Left Gluteal fold (In clinic) Topical Lidocaine 5% applied to wound bed Bathing/ Shower/ Hygiene May shower and wash wound with soap and water. - with  dressing changes Edema Control - Lymphedema / SCD / Other Elevate legs to the level of the heart or above for 30 minutes daily and/or when sitting, a frequency of: Avoid standing for long periods of time. Wound Treatment Wound #1 - Foot Wound Laterality: Dorsal, Left Cleanser: Soap and Water Every Other Day/30 Days Discharge Instructions: May shower and wash wound with dial antibacterial soap and water prior to dressing change. Cleanser: Wound Cleanser Every Other Day/30 Days Discharge Instructions: Cleanse the wound with wound cleanser prior to applying a clean dressing using gauze sponges, not tissue or cotton balls. Prim Dressing: Iodosorb Gel 10 (gm) Tube (DME) (Generic) Every Other Day/30 Days ary Discharge Instructions: Apply to wound bed as instructed Secondary Dressing: Woven Gauze Sponge, Non-Sterile 4x4 in Every Other Day/30 Days Discharge Instructions: Apply over primary dressing as directed. Secured With: Elastic Bandage 4 inch (ACE bandage) Every Other Day/30 Days Discharge Instructions: Secure with ACE bandage as directed. Secured With: [employer], 4.5x3.1 (in/yd) Every Other Day/30 Days Discharge Instructions: Secure with Kerlix as directed. Wound #2 - Gluteal fold Wound Laterality: Left Cleanser: Soap and Water 1 x Per Day/30 Days Discharge Instructions: May shower and wash wound with dial antibacterial soap and water prior to dressing change. Cleanser: Wound Cleanser 1 x Per Day/30 Days Discharge Instructions: Cleanse the wound with wound cleanser prior to applying a clean dressing using gauze sponges, not tissue or cotton balls. Prim Dressing: KerraCel Ag Gelling Fiber Dressing, 4x5 in (silver alginate) 1 x Per Day/30 Days ary Discharge Instructions: Apply silver alginate to wound bed as instructed Secondary Dressing: ABD Pad, 5x9 1 x Per Day/30 Days Discharge Instructions: Apply over primary dressing as directed. Secondary Dressing: Woven Gauze Sponge,  Non-Sterile 4x4 in 1 x Per Day/30 Days Discharge Instructions: Apply over primary dressing as directed. Patient Medications llergies: adhesive tape A Notifications Medication Indication Start End prior to debridement 06/08/2022 lidocaine DOSE topical 5 % cream - cream topical Electronic Signature(s) Signed: 06/09/2022 10:03:44 AM By: Lori Maudlin MD FACS Signed: 06/09/2022 5:03:05 PM By: Blanche East RN Previous Signature: 06/08/2022 4:11:17 PM Version By: Lori Maudlin MD FACS Entered By: Blanche East on 06/08/2022 16:53:35 Prescription 06/08/2022 -------------------------------------------------------------------------------- Gabriela Eves R. Lori Maudlin MD Patient Name: Provider: 06/28/1976 [id number] Date of Birth: NPI#: [id number] Sex: DEA #: [phone] [id number] Phone #: License #: [place] Patient Address: [place], [place] [address] [place], [place] [address] [phone] Allergies adhesive tape Medication Medication: Route: Strength: Form:  lidocaine topical 5% cream Class: TOPICAL LOCAL ANESTHETICS Dose: Frequency / Time: Indication: cream topical prior to debridement Number of Refills: Number of Units: 0 Generic Substitution: Start Date: End Date: Administered at Facility: Substitution Permitted [id number] No Note to Pharmacy: Hand Signature: Date(s): Electronic Signature(s) Signed: 06/09/2022 10:03:44 AM By: Lori Maudlin MD FACS Signed: 06/09/2022 5:03:05 PM By: Blanche East RN Entered By: Blanche East on 06/08/2022 16:53:36 -------------------------------------------------------------------------------- Problem List Details Patient Name: Date of Service: Lori Hawking R. 06/08/2022 3:00 PM Medical Record Number: [id number] Patient Account Number: [id number] Date of Birth/Sex: Treating RN: 1976/02/02 (46 y.o. [name] Primary Care Provider: Juluis Mire Other Clinician: Referring Provider: Treating Provider/Extender: Patrici Ranks in Treatment: 2 Active Problems ICD-10 Encounter Code Description Active Date MDM Diagnosis [phone] Non-pressure chronic ulcer of other part of left foot with fat layer exposed 05/24/2022 No Yes L98.412 Non-pressure chronic ulcer of buttock with fat layer exposed 05/24/2022 No Yes N18.9 Chronic kidney disease, unspecified 05/24/2022 No Yes [id number] Alcoholic hepatitis without ascites 05/24/2022 No Yes Inactive Problems Resolved Problems Electronic Signature(s) Signed: 06/08/2022 3:49:50 PM By: Lori Maudlin MD FACS Entered By: [name] on 06/08/2022 15:49:50 -------------------------------------------------------------------------------- Progress Note Details Patient Name: Date of Service: Lori Hawking R. 06/08/2022 3:00 PM Medical Record Number: [id number] Patient Account Number: [id number] Date of Birth/Sex: Treating RN: [birth year] (46 y.o. [name] Primary Care Provider: Juluis Mire Other Clinician: Referring Provider: Treating Provider/Extender: Patrici Ranks in Treatment: 2 Subjective Chief Complaint Information obtained from Patient Patient seen for complaints of Non-Healing Wounds. History of Present Illness (HPI) ADMISSION 05/24/2022 This is a 46 year old woman with a past medical history significant for hypertension, alcoholic hepatitis, chronic anemia, chronic kidney disease, and peptic ulcer disease. She was admitted to the hospital in the middle of July for hypovolemic shock secondary to dehydration from gastroenteritis. She was found on admission to have an ulcer on her left buttock within the confines of the natal cleft and approaching the anal verge. The etiology was felt to be moisture and friction rather than pressure, as the patient is completely  ambulatory. No debridement was performed. She was readmitted at the beginning of August with worsening lower extremity edema and fatigue. The wound was basically unchanged. Wound nursing was consulted and they recommended using silver alginate. She was discharged from the hospital with referral to the wound care center. This past weekend, she noted a wound on her dorsal left foot that she thinks was secondary to friction from shoes. She is here for evaluation of both these wounds. ABI in clinic today was 1.18. She reports that she has quit drinking but she continues to smoke. On the dorsal surface of her left foot between her fourth and fifth metatarsal heads, there is a circular wound exposing the fat layer. There is some slough and eschar accumulation. It does appear consistent with her history of abrasion. On the medial aspect of her left buttock extending into the natal cleft and towards the anal verge. There is a large ulcer with a very clean surface. There is substantial undermining at the cranial portion of the wound. No purulent drainage or malodor. 06/08/2022: The patient returns today with significant worsening of her dorsal foot wound. Apparently she saw her PCP yesterday who was concerned for cellulitis but did not prescribe any antibiotics. She was also sent for a DVT scan which was negative. There is heavy slough as well as liquefactive necrosis of the fat layer. There is  a bulla between the second and third toes that looks as though it may rupture in the near future. The wound undermines under the skin towards this bulla. No frankly purulent drainage or any odor. Her gluteal wound is very clean but is relatively unchanged in size. She has been managing her wounds on her own and finds it difficult to pack the silver alginate into the undermined portion of the buttock wound. She says that she has been putting peroxide on her foot. Patient History Information obtained from Patient. Family  History Cancer - Mother, Diabetes - Mother, Hypertension - Mother,Maternal Grandparents,Paternal Grandparents, No family history of Heart Disease, Hereditary Spherocytosis, Kidney Disease, Lung Disease, Seizures, Stroke, Thyroid Problems, Tuberculosis. Social History Current every day smoker - .5 pack a day, Marital Status - Single, Alcohol Use - Never, Drug Use - No History, Caffeine Use - Daily. Medical History Hematologic/Lymphatic Patient has history of Anemia, Lymphedema Cardiovascular Patient has history of Hypertension, Hypotension Gastrointestinal Patient has history of Cirrhosis Endocrine Denies history of Type I Diabetes, Type II Diabetes Hospitalization/Surgery History - esophagogastroduodenoscopy. - biopsy. - cholecystectomy. - breast biopsy. - abdominoplasty. - tummy tuck. - hernia repair. - roux-en-y procedure. - tumor removal. - wrist ganglion incision. Medical A Surgical History Notes nd Gastrointestinal slow GI blood loss, alcoholic hepatitis Genitourinary CKD stage III Psychiatric depression Objective Constitutional Tachycardic, asymptomatic.Marland Kitchen No acute distress.. Vitals Time Taken: 3:18 PM, Height: 66 in, Weight: 197 lbs, BMI: 31.8, Temperature: 98.4 F, Pulse: 121 bpm, Respiratory Rate: 18 breaths/min, Blood Pressure: 116/77 mmHg. Respiratory Normal work of breathing on room air.. General Notes: 06/08/2022: The dorsal foot wound is significantly worse today. There is heavy slough as well as liquefactive necrosis of the fat layer. There is a bulla between the second and third toes that looks as though it may rupture in the near future. The wound undermines under the skin towards this bulla. No frankly purulent drainage or any odor. Her gluteal wound is very clean but is relatively unchanged in size. Integumentary (Hair, Skin) Wound #1 status is Open. Original cause of wound was Shear/Friction. The date acquired was: 05/20/2022. The wound has been in treatment 2  weeks. The wound is located on the Left,Dorsal Foot. The wound measures 1cm length x 1.4cm width x 0.1cm depth; 1.1cm^2 area and 0.11cm^3 volume. Wound #2 status is Open. Original cause of wound was Shear/Friction. The date acquired was: 03/20/2022. The wound has been in treatment 2 weeks. The wound is located on the Left Gluteal fold. The wound measures 6cm length x 7cm width x 1.5cm depth; 32.987cm^2 area and 49.48cm^3 volume. There is Fat Layer (Subcutaneous Tissue) exposed. There is no tunneling noted, however, there is undermining starting at 6:00 and ending at 2:00 with a maximum distance of 3.5cm. There is a medium amount of serosanguineous drainage noted. There is medium (34-66%) red, pink granulation within the wound bed. There is a medium (34-66%) amount of necrotic tissue within the wound bed including Adherent Slough. 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 There was a Excisional Skin/Subcutaneous Tissue Debridement with a total area of 1.4 sq cm performed by Lori Maudlin, MD. With the following instrument(s): Curette, Forceps, and Scissors to remove Non-Viable tissue/material. Material removed includes Fat and [facility]  and after achieving pain control using Lidocaine 5% topical ointment. No specimens were taken. A time out was conducted at 15:40, prior to the start of the procedure. A Minimum amount of bleeding was controlled with Pressure. The procedure was tolerated well with a pain level of 10 throughout and a pain level of 0 following the procedure. Post Debridement Measurements: 1cm length x 1.4cm width x 0.4cm depth; 0.44cm^3 volume. Character of Wound/Ulcer Post  Debridement requires further debridement. Post procedure Diagnosis Wound #1: Same as Pre-Procedure Wound #2 Pre-procedure diagnosis of Wound #2 is a Trauma, Other located on the Left Gluteal fold . There was a Selective/Open Wound Non-Viable Tissue Debridement with a total area of 42 sq cm performed by Duanne Guess, MD. With the following instrument(s): Curette to remove Non-Viable tissue/material. Material removed includes Slough and Biofilm and after achieving pain control using Lidocaine 5% topical ointment. No specimens were taken. A time out was conducted at 15:40, prior to the start of the procedure. A Minimum amount of bleeding was controlled with Pressure. The procedure was tolerated well with a pain level of 0 throughout and a pain level of 0 following the procedure. Post Debridement Measurements: 6cm length x 7cm width x 0.1cm depth; 3.299cm^3 volume. Character of Wound/Ulcer Post Debridement requires further debridement. Post procedure Diagnosis Wound #2: Same as Pre-Procedure Plan Follow-up Appointments: Return Appointment in 2 weeks. - Dr. Lady Gary - room 2 Anesthetic: Wound #1 Left,Dorsal Foot: (In clinic) Topical Lidocaine 5% applied to wound bed Bathing/ Shower/ Hygiene: May shower and wash wound with soap and water. - with dressing changes Edema Control - Lymphedema / SCD / Other: Elevate legs to the level of the heart or above for 30 minutes daily and/or when sitting, a frequency of: Avoid standing for long periods of time. The following medication(s) was prescribed: amoxicillin-pot clavulanate oral 875 mg-125 mg tablet 1 tab p.o. twice daily x14 days starting 06/08/2022 WOUND #1: - Foot Wound Laterality: Dorsal, Left Cleanser: Soap and Water Every Other Day/30 Days Discharge Instructions: May shower and wash wound with dial antibacterial soap and water prior to dressing change. Cleanser: Wound Cleanser Every Other Day/30 Days Discharge Instructions: Cleanse the wound  with wound cleanser prior to applying a clean dressing using gauze sponges, not tissue or cotton balls. Prim Dressing: KerraCel Ag Gelling Fiber Dressing, 2x2 in (silver alginate) Every Other Day/30 Days ary Discharge Instructions: Apply silver alginate to wound bed as instructed Secondary Dressing: Woven Gauze Sponge, Non-Sterile 4x4 in Every Other Day/30 Days Discharge Instructions: Apply over primary dressing as directed. Secured With: Elastic Bandage 4 inch (ACE bandage) Every Other Day/30 Days Discharge Instructions: Secure with ACE bandage as directed. Secured With: [employer], 4.5x3.1 (in/yd) Every Other Day/30 Days Discharge Instructions: Secure with Kerlix as directed. WOUND #2: - Gluteal fold Wound Laterality: Left Cleanser: Soap and Water 1 x Per Day/30 Days Discharge Instructions: May shower and wash wound with dial antibacterial soap and water prior to dressing change. Cleanser: Wound Cleanser 1 x Per Day/30 Days Discharge Instructions: Cleanse the wound with wound cleanser prior to applying a clean dressing using gauze sponges, not tissue or cotton balls. Prim Dressing: KerraCel Ag Gelling Fiber Dressing, 4x5 in (silver alginate) 1 x Per Day/30 Days ary Discharge Instructions: Apply silver alginate to wound bed as instructed Secondary Dressing: ABD Pad, 5x9 1 x Per Day/30 Days Discharge Instructions: Apply over primary dressing as directed. Secondary Dressing: Woven Gauze Sponge, Non-Sterile 4x4 in 1 x Per Day/30 Days Discharge Instructions:  Apply over primary dressing as directed. 06/08/2022: The dorsal foot wound is significantly worse today. There is heavy slough as well as liquefactive necrosis of the fat layer. There is a bulla between the second and third toes that looks as though it may rupture in the near future. The wound undermines under the skin towards this bulla. No frankly purulent drainage or any odor. Her gluteal wound is very clean but is relatively  unchanged in size. I used a curette to debride slough and biofilm off the gluteal wound. I debrided fibrin, slough, and necrotic fat from her dorsal foot wound. I also took a culture from her dorsal foot wound. I have empirically prescribed Augmentin for her to take for 14 days. Once her culture data return, we may need to adjust for better coverage. We will continue to use silver alginate to her gluteal wound. I have changed her dorsal foot wound to Iodosorb to try and achieve more chemical debridement as well as decrease the bacterial load. She was instructed to not put peroxide on her wound. We are going to work on getting her some home health assistance if possible as she is not able to adequately pack her gluteal wound. Follow-up in 1 week. Electronic Signature(s) Signed: 06/08/2022 3:59:50 PM By: Lori Maudlin MD FACS Signed: 06/08/2022 3:59:50 PM By: Lori Maudlin MD FACS Entered By: [name] on 06/08/2022 15:59:50 -------------------------------------------------------------------------------- HxROS Details Patient Name: Date of Service: Lori Hawking R. 06/08/2022 3:00 PM Medical Record Number: [id number] Patient Account Number: [id number] Date of Birth/Sex: Treating RN: 06-29-1976 (46 y.o. [name] Primary Care Provider: Juluis Mire Other Clinician: Referring Provider: Treating Provider/Extender: Patrici Ranks in Treatment: 2 Information Obtained From Patient Hematologic/Lymphatic Medical History: Positive for: Anemia; Lymphedema Cardiovascular Medical History: Positive for: Hypertension; Hypotension Gastrointestinal Medical History: Positive for: Cirrhosis Past Medical History Notes: slow GI blood loss, alcoholic hepatitis Endocrine Medical History: Negative for: Type I Diabetes; Type II Diabetes Genitourinary Medical History: Past Medical History Notes: CKD stage III Psychiatric Medical History: Past  Medical History Notes: depression Immunizations Pneumococcal Vaccine: Received Pneumococcal Vaccination: No Implantable Devices None Hospitalization / Surgery History Type of Hospitalization/Surgery esophagogastroduodenoscopy biopsy cholecystectomy breast biopsy abdominoplasty tummy tuck hernia repair roux-en-y procedure tumor removal wrist ganglion incision Family and Social History Cancer: Yes - Mother; Diabetes: Yes - Mother; Heart Disease: No; Hereditary Spherocytosis: No; Hypertension: Yes - Mother,Maternal Grandparents,Paternal Grandparents; Kidney Disease: No; Lung Disease: No; Seizures: No; Stroke: No; Thyroid Problems: No; Tuberculosis: No; Current every day smoker - .5 pack a day; Marital Status - Single; Alcohol Use: Never; Drug Use: No History; Caffeine Use: Daily; Financial Concerns: Yes; Food, Clothing or Shelter Needs: No; Support System Lacking: No; Transportation Concerns: No Electronic Signature(s) Signed: 06/08/2022 4:07:57 PM By: Lori Maudlin MD FACS Signed: 06/09/2022 4:48:09 PM By: Sabas Sous By: [name] on 06/08/2022 15:54:41 -------------------------------------------------------------------------------- [place] Details Patient Name: Date of Service: Lori Hawking R. 06/08/2022 Medical Record Number: [id number] Patient Account Number: [id number] Date of Birth/Sex: Treating RN: March 12, 1976 (46 y.o. 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 N18.9 Chronic kidney disease, unspecified [id number] Alcoholic hepatitis without ascites Facility Procedures CPT4 Code: [id number] Description:  11042 - DEB SUBQ TISSUE 20 SQ CM/< ICD-10 Diagnosis  Description L97.522 Non-pressure chronic ulcer of other part of left foot with fat layer exposed Modifier: Quantity: 1 CPT4 Code: [id number] Description: [id number] - DEBRIDE WOUND 1ST 20 SQ CM OR < ICD-10 Diagnosis Description L98.412 Non-pressure chronic ulcer of buttock with fat layer exposed Modifier: Quantity: 1 CPT4 Code: [id number] Description: [id number] - DEBRIDE WOUND EA ADDL 20 SQ CM ICD-10 Diagnosis Description L98.412 Non-pressure chronic ulcer of buttock with fat layer exposed Modifier: Quantity: 2 Physician Procedures : CPT4 Code Description Modifier [id number] - WC PHYS LEVEL 4 - EST PT 25 ICD-10 Diagnosis Description L97.522 Non-pressure chronic ulcer of other part of left foot with fat layer exposed L98.412 Non-pressure chronic ulcer of buttock with fat layer  exposed N18.9 Chronic kidney disease, unspecified [id number] Alcoholic hepatitis without ascites Quantity: 1 : [id number] - WC PHYS SUBQ TISS 20 SQ CM ICD-10 Diagnosis Description L97.522 Non-pressure chronic ulcer of other part of left foot with fat layer exposed Quantity: 1 : [id number] - WC PHYS DEBR WO ANESTH 20 SQ CM ICD-10 Diagnosis Description L98.412 Non-pressure chronic ulcer of buttock with fat layer exposed Quantity: 1 : [id number] - WC PHYS DEBR WO ANESTH EA ADD 20 CM ICD-10 Diagnosis Description L98.412 Non-pressure chronic ulcer of buttock with fat layer exposed Quantity: 2 Electronic Signature(s) Signed: 06/08/2022 4:02:04 PM By: Lori Maudlin MD FACS Entered By: [name] on 06/08/2022 16:02:04

## 2022-06-09 NOTE — Progress Notes (Addendum)
Lori, PETITFRERE Chambers ([id number]) [id number] Page 1 of 7 Visit Report for 06/08/2022 Arrival Information Details Patient Name: Date of Service: [name], [name] 06/08/2022 3:00 PM Medical Record Number: [id number] Patient Account Number: [id number] Date of Birth/Sex: Treating RN: [birth year] (46 y.o. Iver Nestle, Jamie Primary Care Cesia Orf: Juluis Mire Other Clinician: Referring Natsha Guidry: Treating Keamber Macfadden/Extender: Patrici Ranks in Treatment: 2 Visit Information History Since Last Visit All ordered tests and consults were completed: Yes Patient Arrived: Cane Added or deleted any medications: No Arrival Time: 15:12 Any new allergies or adverse reactions: No Accompanied By: self Had a fall or experienced change in No Transfer Assistance: None activities of daily living that may affect Patient Requires Transmission-Based Precautions: No risk of falls: Patient Has Alerts: No Signs or symptoms of abuse/neglect since last visito No Hospitalized since last visit: No Implantable device outside of the clinic excluding No cellular tissue based products placed in the center since last visit: Pain Present Now: Yes Electronic Signature(s) Signed: 11/20/2022 4:58:09 PM By: Blanche East RN Previous Signature: 06/09/2022 5:03:05 PM Version By: Blanche East RN Entered By: Blanche East on 06/16/2022 14:18:56 -------------------------------------------------------------------------------- Encounter Discharge Information Details Patient Name: Date of Service: Lori Hawking Chambers. 06/08/2022 3:00 PM Medical Record Number: [id number] Patient Account Number: [id number] Date of Birth/Sex: Treating RN: 05-01-1976 (46 y.o. [name] Primary Care [place]: Juluis Mire Other Clinician: Referring Yareliz Thorstenson: Treating Creston Klas/Extender: Patrici Ranks in Treatment: 2 Encounter Discharge Information Items Post  Procedure Vitals Discharge Condition: Stable Temperature (F): 98.4 Ambulatory Status: Ambulatory Pulse (bpm): 121 Discharge Destination: Home Respiratory Rate (breaths/min): 18 Transportation: Private Auto Blood Pressure (mmHg): 116/77 Schedule Follow-up Appointment: No Clinical Summary of Care: Electronic Signature(s) Signed: 06/09/2022 5:03:05 PM By: Blanche East RN Entered By: Blanche East on 06/08/2022 16:57:18 -------------------------------------------------------------------------------- Lower Extremity Assessment Details Patient Name: Date of Service: Lori Hawking Chambers. 06/08/2022 3:00 PM Medical Record Number: [id number] Patient Account Number: [id number] Date of Birth/Sex: Treating RN: 01/07/1976 (46 y.o. [name] Primary Care Tonie Vizcarrondo: Juluis Mire Other Clinician: Referring Shalia Bartko: Treating Amirra Herling/Extender: Ludwig Lean Weeks in Treatment: 2 Edema Assessment Assessed: [Left: No] [Right: No] F[LeftMARIDEE, LEITHEAD ([id number] [[id number].pdf Page 2 of 7] [Left: Edema] [Right: :] Calf Left: Right: Point of Measurement: From Medial Instep 38.5 cm Ankle Left: Right: Point of Measurement: From Medial Instep 30 cm Vascular Assessment Pulses: Dorsalis Pedis Palpable: [Left:No] Popliteal Palpable: [Left:Yes] Electronic Signature(s) Signed: 06/09/2022 5:03:05 PM By: Blanche East RN Entered By: Blanche East on 06/08/2022 15:22:03 -------------------------------------------------------------------------------- Multi Wound Chart Details Patient Name: Date of Service: Lori Hawking Chambers. 06/08/2022 3:00 PM Medical Record Number: [id number] Patient Account Number: [id number] Date of Birth/Sex: Treating RN: 1976-04-12 (46 y.o. [name] Primary Care Willem Klingensmith: Juluis Mire Other Clinician: Referring Yurika Pereda: Treating Makalya Nave/Extender: Patrici Ranks  in Treatment: 2 Vital Signs Height(in): 66 Pulse(bpm): 121 Weight(lbs): 197 Blood Pressure(mmHg): 116/77 Body Mass Index(BMI): 31.8 Temperature(F): 98.4 Respiratory Rate(breaths/min): 18 [1:Photos: No Photos Left, Dorsal Foot Wound Location: Shear/Friction Wounding Event: Trauma, Other Primary Etiology: N/A Comorbid History: 05/20/2022 Date Acquired: 2 Weeks of Treatment: Open Wound Status: No Wound Recurrence: 1x1.4x0.1 Measurements L x W x D (cm)  1.1 A (cm) : rea 0.11 Volume (cm) : -55.60% % Reduction in A rea: -54.90% % Reduction in Volume: Starting Position 1 (o'clock): Ending Position 1 (o'clock): Maximum Distance 1 (cm): N/A Undermining: Partial Thickness Classification: N/A Exudate Amount:  N/A Exudate Type: N/A Exudate Color: N/A  Granulation A mount: N/A Granulation Quality: N/A Necrotic Amount: Debridement - Excisional Debridement: 15:40 Pre-procedure Verification/Time Out Taken: Lidocaine 5% topical ointment Pain Control: Fat, Slough  Tissue Debrided: Skin/Subcutaneous Tissue Level:] [2:No Photos Left Gluteal fold Shear/Friction Trauma, Other Anemia, Lymphedema, Hypertension, Hypotension, Cirrhosis 03/20/2022 2 Open No 6x7x1.5 32.987 49.48 -30.40% -50.50% 6 2 3.5 Yes Full Thickness  Without Exposed Support Structures Medium Serosanguineous red, brown Medium (34-66%) Red, Pink Medium (34-66%) Debridement - Selective/Open Wound 15:40 Lidocaine 5% topical ointment Slough Non-Viable Tissue] [N/A:N/A N/A N/A N/A N/A N/A N/A N/A N/A N/A  N/A N/A N/A N/A N/A N/A N/A N/A N/A N/A N/A N/A N/A N/A N/A N/A N/A] MARGINE, UFFORD ([id number]) [1:1.4 Debridement A (sq cm): rea Curette, Forceps, Scissors Instrument: Minimum Bleeding: Pressure Hemostasis Achieved: 10 Procedural Pain: 0 Post Procedural Pain: Procedure was tolerated well Debridement Treatment Response: 1x1.4x0.4 Post Debridement  Measurements L x W x D (cm) 0.44 Post Debridement Volume: (cm) Debridement Procedures Performed:] [2:42  Curette Minimum Pressure 0 0 Procedure was tolerated well 6x7x0.1 3.299 Debridement] [N/A:[id number] Page 3 of 7 N/A N/A  N/A N/A N/A N/A N/A N/A N/A N/A] Treatment Notes Electronic Signature(s) Signed: 06/08/2022 3:51:54 PM By: Fredirick Maudlin MD FACS Signed: 06/09/2022 4:48:09 PM By: Adline Peals Entered By: Fredirick Maudlin on 06/08/2022 15:51:53 -------------------------------------------------------------------------------- Multi-Disciplinary Care Plan Details Patient Name: Date of Service: Lori Hawking Chambers. 06/08/2022 3:00 PM Medical Record Number: [id number] Patient Account Number: [id number] Date of Birth/Sex: Treating RN: 05-27-1976 (46 y.o. [name] Primary Care Henderson Frampton: Juluis Mire Other Clinician: Referring Kinta Martis: Treating Danille Oppedisano/Extender: Patrici Ranks in Treatment: 2 Active Inactive Pressure Nursing Diagnoses: Knowledge deficit related to causes and risk factors for pressure ulcer development Potential for impaired tissue integrity related to pressure, friction, moisture, and shear Goals: Patient will remain free from development of additional pressure ulcers Date Initiated: 05/24/2022 Target Resolution Date: 06/30/2022 Goal Status: Active Patient/caregiver will verbalize risk factors for pressure ulcer development Date Initiated: 05/24/2022 Target Resolution Date: 06/30/2022 Goal Status: Active Interventions: Assess: immobility, friction, shearing, incontinence upon admission and as needed Assess potential for pressure ulcer upon admission and as needed Notes: Wound/Skin Impairment Nursing Diagnoses: Impaired tissue integrity Knowledge deficit related to ulceration/compromised skin integrity Goals: Patient will demonstrate a reduced rate of smoking or cessation of smoking Date Initiated: 05/24/2022 Target Resolution Date: 06/30/2022 Goal Status: Active Patient/caregiver will verbalize  understanding of skin care regimen Date Initiated: 05/24/2022 Target Resolution Date: 06/30/2022 Goal Status: Active Interventions: Assess patient/caregiver ability to obtain necessary supplies Assess ulceration(s) every visit Treatment Activities: Skin care regimen initiated : 05/24/2022 Topical wound management initiated : 05/24/2022 CATIRIA, DELAGRANGE ([id number]) [phone].pdf Page 4 of 7 Notes: Electronic Signature(s) Signed: 06/09/2022 5:03:05 PM By: Blanche East RN Entered By: Blanche East on 06/08/2022 16:10:21 -------------------------------------------------------------------------------- Pain Assessment Details Patient Name: Date of Service: Lori Hawking Chambers. 06/08/2022 3:00 PM Medical Record Number: [id number] Patient Account Number: [id number] Date of Birth/Sex: Treating RN: 08/14/1976 (46 y.o. [name] Primary Care Latanya Hemmer: Juluis Mire Other Clinician: Referring Jong Rickman: Treating Akili Corsetti/Extender: Patrici Ranks in Treatment: 2 Active Problems Location of Pain Severity and Description of Pain Patient Has Paino Yes Site Locations Rate the pain. Current Pain Level: 10 Character of Pain Describe the Pain: Aching, Throbbing Pain Management and Medication Current Pain Management: Electronic Signature(s) Signed: 06/09/2022 5:03:05 PM By: Blanche East RN Entered By: Blanche East on 06/08/2022 15:19:38 -------------------------------------------------------------------------------- Patient/Caregiver Education Details Patient Name: Date of Service: Lori Hawking  Chambers. 9/14/2023andnbsp3:00 PM Medical Record Number: [id number] Patient Account Number: [id number] Date of Birth/Gender: Treating RN: 1976/02/13 (46 y.o. [name] Primary Care Physician: Juluis Mire Other Clinician: Referring Physician: Treating Physician/Extender: Patrici Ranks in Treatment:  2 Education Assessment Education Provided To: Patient Education Topics Provided Wound/Skin ImpairmentTRENEICE, KAHRE ([id number]) [id number] Page 5 of 7 Methods: Explain/Verbal Responses: Reinforcements needed, State content correctly Electronic Signature(s) Signed: 06/09/2022 5:03:05 PM By: Blanche East RN Entered By: Blanche East on 06/08/2022 16:10:32 -------------------------------------------------------------------------------- Wound Assessment Details Patient Name: Date of Service: Lori Hawking Chambers. 06/08/2022 3:00 PM Medical Record Number: [id number] Patient Account Number: [id number] Date of Birth/Sex: Treating RN: September 12, 1976 (46 y.o. Iver Nestle, Jamie Primary Care Thermon Zulauf: Juluis Mire Other Clinician: Referring Megham Dwyer: Treating Lynnelle Mesmer/Extender: Patrici Ranks in Treatment: 2 Wound Status Wound Number: 1 Primary Etiology: Trauma, Other Wound Location: Left, Dorsal Foot Wound Status: Open Wounding Event: Shear/Friction Comorbid Anemia, Lymphedema, Hypertension, Hypotension, History: Cirrhosis Date Acquired: 05/20/2022 Weeks Of Treatment: 2 Clustered Wound: No Photos Photo Uploaded By: Donavan Burnet on 06/08/2022 19:04:55 Wound Measurements Length: (cm) 1 Width: (cm) 1.4 Depth: (cm) 0.1 Area: (cm) 1.1 Volume: (cm) 0.11 % Reduction in Area: -55.6% % Reduction in Volume: -54.9% Tunneling: Yes Position (o'clock): 3 Maximum Distance: (cm) 2.5 Undermining: No Wound Description Classification: Partial Thickness Exudate Amount: Medium Exudate Type: Serosanguineous Exudate Color: red, brown Foul Odor After Cleansing: No Slough/Fibrino Yes Wound Bed Granulation Amount: Small (1-33%) Exposed Structure Granulation Quality: Red Fascia Exposed: No Necrotic Amount: Large (67-100%) Fat Layer (Subcutaneous Tissue) Exposed: Yes Necrotic Quality: Eschar, Adherent Slough Tendon Exposed:  No Muscle Exposed: No Joint Exposed: No Bone Exposed: No Electronic Signature(s) Signed: 06/09/2022 5:03:05 PM By: Blanche East RN Entered By: Blanche East on 06/08/2022 16:09:25 [name] ([id number] Page 6 of 7 -------------------------------------------------------------------------------- Wound Assessment Details Patient Name: Date of Service: EMANIE, VENTRONE 06/08/2022 3:00 PM Medical Record Number: [id number] Patient Account Number: [id number] Date of Birth/Sex: Treating RN: [birth year] (46 y.o. Iver Nestle, Jamie Primary Care William Schake: Juluis Mire Other Clinician: Referring Juanpablo Ciresi: Treating Treyana Sturgell/Extender: Patrici Ranks in Treatment: 2 Wound Status Wound Number: 2 Primary Etiology: Trauma, Other Wound Location: Left Gluteal fold Wound Status: Open Wounding Event: Shear/Friction Comorbid Anemia, Lymphedema, Hypertension, Hypotension, History: Cirrhosis Date Acquired: 03/20/2022 Weeks Of Treatment: 2 Clustered Wound: No Photos Photo Uploaded By: Donavan Burnet on 06/08/2022 19:04:56 Wound Measurements Length: (cm) 6 Width: (cm) 7 Depth: (cm) 1.5 Area: (cm) 32.987 Volume: (cm) 49.48 % Reduction in Area: -30.4% % Reduction in Volume: -50.5% Tunneling: No Undermining: Yes Starting Position (o'clock): 6 Ending Position (o'clock): 2 Maximum Distance: (cm) 3.5 Wound Description Classification: Full Thickness Without Exposed Support Structures Exudate Amount: Medium Exudate Type: Serosanguineous Exudate Color: red, brown Foul Odor After Cleansing: No Slough/Fibrino No Wound Bed Granulation Amount: Medium (34-66%) Exposed Structure Granulation Quality: Red, Pink Fascia Exposed: No Necrotic Amount: Medium (34-66%) Fat Layer (Subcutaneous Tissue) Exposed: Yes Necrotic Quality: Adherent Slough Tendon Exposed: No Muscle Exposed: No Joint Exposed: No Bone Exposed:  No Electronic Signature(s) Signed: 06/09/2022 5:03:05 PM By: Blanche East RN Entered By: Blanche East on 06/08/2022 16:09:34 -------------------------------------------------------------------------------- Vitals Details Patient Name: Date of Service: Lori Hawking Chambers. 06/08/2022 3:00 PM Medical Record Number: [id number] Patient Account Number: [id number] Date of Birth/Sex: Treating RN: July 16, 1976 (46 y.o. [name] Primary Care Jaunita Mikels: Juluis Mire Other Clinician: Referring Kinslei Labine: Treating Kiwana Deblasi/Extender: Ludwig Lean Hollister, Mayflower Chambers ([id number]) [id number] Page 7 of  7 Weeks in Treatment: 2 Vital Signs Time Taken: 15:18 Temperature (F): 98.4 Height (in): 66 Pulse (bpm): 121 Weight (lbs): 197 Respiratory Rate (breaths/min): 18 Body Mass Index (BMI): 31.8 Blood Pressure (mmHg): 116/77 Reference Range: 80 - 120 mg / dl Electronic Signature(s) Signed: 06/09/2022 5:03:05 PM By: Blanche East RN Entered By: Blanche East on 06/08/2022 15:19:20

## 2022-06-12 ENCOUNTER — Ambulatory Visit: Payer: Self-pay | Admitting: Cardiology

## 2022-06-15 ENCOUNTER — Encounter (HOSPITAL_BASED_OUTPATIENT_CLINIC_OR_DEPARTMENT_OTHER): Payer: Self-pay | Admitting: General Surgery

## 2022-06-16 ENCOUNTER — Encounter (HOSPITAL_BASED_OUTPATIENT_CLINIC_OR_DEPARTMENT_OTHER): Payer: Self-pay | Admitting: General Surgery

## 2022-06-16 NOTE — Progress Notes (Signed)
[name], [name] ([id number]) Visit Report for 06/16/2022 SuperBill Details Patient Name: Date of Service: [name], [name] 06/16/2022 Medical Record Number: [id number] Patient Account Number: [id number] Date of Birth/Sex: Treating RN: 01/24/1976 (46 y.o. 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

## 2022-06-16 NOTE — Progress Notes (Signed)
SHRIKA, MILOS ([id number]) Visit Report for 06/16/2022 Arrival Information Details Patient Name: Date of Service: BRITAINY, KOZUB 06/16/2022 2:00 PM Medical Record Number: [id number] Patient Account Number: [id number] Date of Birth/Sex: Treating RN: September 28, 1975 (46 y.o. Iver Nestle, Jamie Primary Care Delno Blaisdell: Juluis Mire Other Clinician: Referring Tyke Outman: Treating Shiree Altemus/Extender: Patrici Ranks in Treatment: 3 Visit Information History Since Last Visit Added or deleted any medications: No Patient Arrived: Kasandra Knudsen Any new allergies or adverse reactions: No Arrival Time: 14:19 Had a fall or experienced change in No Accompanied By: self activities of daily living that may affect Transfer Assistance: None risk of falls: Patient Identification Verified: Yes Signs or symptoms of abuse/neglect since last visito No Patient Requires Transmission-Based Precautions: No Hospitalized since last visit: No Patient Has Alerts: No Implantable device outside of the clinic excluding No cellular tissue based products placed in the center since last visit: Has Dressing in Place as Prescribed: Yes Pain Present Now: Yes Electronic Signature(s) Signed: 06/16/2022 4:59:13 PM By: Blanche East RN Entered By: Blanche East on 06/16/2022 14:19:33 -------------------------------------------------------------------------------- Clinic Level of Care Assessment Details Patient Name: Date of Service: LYNISE, PORR 06/16/2022 2:00 PM Medical Record Number: [id number] Patient Account Number: [id number] Date of Birth/Sex: Treating RN: 05/04/1976 (46 y.o. America Brown Primary Care Nyoka Alcoser: Juluis Mire Other Clinician: Referring Kamorah Nevils: Treating Tyrek Lawhorn/Extender: Patrici Ranks in Treatment: 3 Clinic Level of Care Assessment Items TOOL 4 Quantity Score X- 1 0 Use when only an EandM is performed on FOLLOW-UP  visit ASSESSMENTS - Nursing Assessment / Reassessment X- 1 10 Reassessment of Co-morbidities (includes updates in patient status) X- 1 5 Reassessment of Adherence to Treatment Plan ASSESSMENTS - Wound and Skin A ssessment / Reassessment []  - 0 Simple Wound Assessment / Reassessment - one wound X- 2 5 Complex Wound Assessment / Reassessment - multiple wounds []  - 0 Dermatologic / Skin Assessment (not related to wound area) ASSESSMENTS - Focused Assessment []  - 0 Circumferential Edema Measurements - multi extremities []  - 0 Nutritional Assessment / Counseling / Intervention []  - 0 Lower Extremity Assessment (monofilament, tuning fork, pulses) []  - 0 Peripheral Arterial Disease Assessment (using hand held doppler) ASSESSMENTS - Ostomy and/or Continence Assessment and Care []  - 0 Incontinence Assessment and Management []  - 0 Ostomy Care Assessment and Management (repouching, etc.) PROCESS - Coordination of Care X - Simple Patient / Family Education for ongoing care 1 15 []  - 0 Complex (extensive) Patient / Family Education for ongoing care X- 1 10 Staff obtains Programmer, systems, Records, T Results / Process Orders est X- 1 10 Staff telephones HHA, Nursing Homes / Clarify orders / etc []  - 0 Routine Transfer to another Facility (non-emergent condition) []  - 0 Routine Hospital Admission (non-emergent condition) []  - 0 New Admissions / Biomedical engineer / Ordering NPWT Apligraf, etc. , []  - 0 Emergency Hospital Admission (emergent condition) X- 1 10 Simple Discharge Coordination []  - 0 Complex (extensive) Discharge Coordination PROCESS - Special Needs []  - 0 Pediatric / Minor Patient Management []  - 0 Isolation Patient Management []  - 0 Hearing / Language / Visual special needs []  - 0 Assessment of Community assistance (transportation, D/C planning, etc.) []  - 0 Additional assistance / Altered mentation []  - 0 Support Surface(s) Assessment (bed, cushion, seat,  etc.) INTERVENTIONS - Wound Cleansing / Measurement []  - 0 Simple Wound Cleansing - one wound X- 2 5 Complex Wound Cleansing - multiple wounds []  - 0 Wound Imaging (photographs - any  number of wounds) []  - 0 Wound Tracing (instead of photographs) []  - 0 Simple Wound Measurement - one wound []  - 0 Complex Wound Measurement - multiple wounds INTERVENTIONS - Wound Dressings []  - 0 Small Wound Dressing one or multiple wounds X- 2 15 Medium Wound Dressing one or multiple wounds []  - 0 Large Wound Dressing one or multiple wounds []  - 0 Application of Medications - topical []  - 0 Application of Medications - injection INTERVENTIONS - Miscellaneous []  - 0 External ear exam []  - 0 Specimen Collection (cultures, biopsies, blood, body fluids, etc.) []  - 0 Specimen(s) / Culture(s) sent or taken to Lab for analysis []  - 0 Patient Transfer (multiple staff / Civil Service fast streamer / Similar devices) []  - 0 Simple Staple / Suture removal (25 or less) []  - 0 Complex Staple / Suture removal (26 or more) []  - 0 Hypo / Hyperglycemic Management (close monitor of Blood Glucose) []  - 0 Ankle / Brachial Index (ABI) - do not check if billed separately X- 1 5 Vital Signs Has the patient been seen at the hospital within the last three years: Yes Total Score: 115 Level Of Care: New/Established - Level 3 Electronic Signature(s) Signed: 06/16/2022 5:25:22 PM By: Dellie Catholic RN Entered By: Dellie Catholic on 06/16/2022 14:55:00 -------------------------------------------------------------------------------- Encounter Discharge Information Details Patient Name: Date of Service: [name] 06/16/2022 2:00 PM Medical Record Number: [id number] Patient Account Number: [id number] Date of Birth/Sex: Treating RN: April 23, 1976 (46 y.o. America Brown Primary Care Ziad Maye: Juluis Mire Other Clinician: Referring Wilhelm Ganaway: Treating Aryona Sill/Extender: Patrici Ranks  in Treatment: 3 Encounter Discharge Information Items Discharge Condition: Stable Ambulatory Status: Cane Discharge Destination: Home Transportation: Private Auto Accompanied By: self Schedule Follow-up Appointment: Yes Clinical Summary of Care: Patient Declined Electronic Signature(s) Signed: 06/16/2022 5:25:22 PM By: Dellie Catholic RN Entered By: Dellie Catholic on 06/16/2022 14:53:49 -------------------------------------------------------------------------------- Patient/Caregiver Education Details Patient Name: Date of Service: Ernie Hew 9/22/2023andnbsp2:00 PM Medical Record Number: [id number] Patient Account Number: [id number] Date of Birth/Gender: Treating RN: 14-Oct-1975 (46 y.o. America Brown Primary Care Physician: Juluis Mire Other Clinician: Referring Physician: Treating Physician/Extender: Patrici Ranks in Treatment: 3 Education Assessment Education Provided To: Patient Education Topics Provided Wound/Skin Impairment: Methods: Explain/Verbal Responses: Return demonstration correctly Electronic Signature(s) Signed: 06/16/2022 5:25:22 PM By: Dellie Catholic RN Entered By: Dellie Catholic on 06/16/2022 14:53:32 -------------------------------------------------------------------------------- Wound Assessment Details Patient Name: Date of Service: [name] 06/16/2022 2:00 PM Medical Record Number: [id number] Patient Account Number: [id number] Date of Birth/Sex: Treating RN: 1976-07-05 (46 y.o. Iver Nestle, Jamie Primary Care Makar Slatter: Juluis Mire Other Clinician: Referring Jamaris Biernat: Treating Islam Eichinger/Extender: Patrici Ranks in Treatment: 3 Wound Status Wound Number: 1 Primary Etiology: Trauma, Other Wound Location: Left, Dorsal Foot Wound Status: Open Wounding Event: Shear/Friction Comorbid Anemia, Lymphedema, Hypertension, Hypotension, History: Cirrhosis Date Acquired:  05/20/2022 Weeks Of Treatment: 3 Clustered Wound: No Wound Measurements Length: (cm) 1 Width: (cm) 1.4 Depth: (cm) 0.1 Area: (cm) 1.1 Volume: (cm) 0.11 % Reduction in Area: -55.6% % Reduction in Volume: -54.9% Epithelialization: None Tunneling: No Undermining: No Wound Description Classification: Partial Thickness Exudate Amount: Medium Exudate Type: Serosanguineous Exudate Color: red, brown Foul Odor After Cleansing: No Slough/Fibrino Yes Wound Bed Granulation Amount: Small (1-33%) Exposed Structure Granulation Quality: Red Fascia Exposed: No Necrotic Amount: Large (67-100%) Fat Layer (Subcutaneous Tissue) Exposed: No Necrotic Quality: Eschar, Adherent Slough Tendon Exposed: No Muscle Exposed: No Joint Exposed: No Bone Exposed: No Treatment Notes Wound #  1 (Foot) Wound Laterality: Dorsal, Left Cleanser Soap and Water Discharge Instruction: May shower and wash wound with dial antibacterial soap and water prior to dressing change. Wound Cleanser Discharge Instruction: Cleanse the wound with wound cleanser prior to applying a clean dressing using gauze sponges, not tissue or cotton balls. Peri-Wound Care Topical Primary Dressing Iodosorb Gel 10 (gm) Tube Discharge Instruction: Apply to wound bed as instructed Secondary Dressing Woven Gauze Sponge, Non-Sterile 4x4 in Discharge Instruction: Apply over primary dressing as directed. Secured With Elastic Bandage 4 inch (ACE bandage) Discharge Instruction: Secure with ACE bandage as directed. Kerlix Roll Sterile, 4.5x3.1 (in/yd) Discharge Instruction: Secure with Kerlix as directed. Compression Wrap Compression Stockings Add-Ons Electronic Signature(s) Signed: 06/16/2022 4:59:13 PM By: Blanche East RN Entered By: Blanche East on 06/16/2022 14:21:18 -------------------------------------------------------------------------------- Wound Assessment Details Patient Name: Date of Service: [name]  06/16/2022 2:00 PM Medical Record Number: [id number] Patient Account Number: [id number] Date of Birth/Sex: Treating RN: 04-04-1976 (46 y.o. Iver Nestle, Jamie Primary Care Vasilisa Vore: Juluis Mire Other Clinician: Referring Caitlin Ainley: Treating Logun Colavito/Extender: Patrici Ranks in Treatment: 3 Wound Status Wound Number: 2 Primary Etiology: Trauma, Other Wound Location: Left Gluteal fold Wound Status: Open Wounding Event: Shear/Friction Comorbid Anemia, Lymphedema, Hypertension, Hypotension, History: Cirrhosis Date Acquired: 03/20/2022 Weeks Of Treatment: 3 Clustered Wound: No Wound Measurements Length: (cm) 6 Width: (cm) 7 Depth: (cm) 1.5 Area: (cm) 32.987 Volume: (cm) 49.48 % Reduction in Area: -30.4% % Reduction in Volume: -50.5% Tunneling: No Undermining: Yes Starting Position (o'clock): 6 Ending Position (o'clock): 2 Maximum Distance: (cm) 3.5 Wound Description Classification: Full Thickness Without Exposed Support Structures Exudate Amount: Medium Exudate Type: Serosanguineous Exudate Color: red, brown Foul Odor After Cleansing: No Slough/Fibrino No Wound Bed Granulation Amount: Medium (34-66%) Exposed Structure Granulation Quality: Red, Pink Fascia Exposed: No Necrotic Amount: Medium (34-66%) Fat Layer (Subcutaneous Tissue) Exposed: Yes Necrotic Quality: Adherent Slough Tendon Exposed: No Muscle Exposed: No Joint Exposed: No Bone Exposed: No Treatment Notes Wound #2 (Gluteal fold) Wound Laterality: Left Cleanser Soap and Water Discharge Instruction: May shower and wash wound with dial antibacterial soap and water prior to dressing change. Wound Cleanser Discharge Instruction: Cleanse the wound with wound cleanser prior to applying a clean dressing using gauze sponges, not tissue or cotton balls. Peri-Wound Care Topical Primary Dressing KerraCel Ag Gelling Fiber Dressing, 4x5 in (silver alginate) Discharge Instruction: Apply  silver alginate to wound bed as instructed Secondary Dressing ABD Pad, 5x9 Discharge Instruction: Apply over primary dressing as directed. Woven Gauze Sponge, Non-Sterile 4x4 in Discharge Instruction: Apply over primary dressing as directed. Secured With Compression Wrap Compression Stockings Environmental education officer) Signed: 06/16/2022 4:59:13 PM By: Blanche East RN Entered By: Blanche East on 06/16/2022 14:21:56 -------------------------------------------------------------------------------- Vitals Details Patient Name: Date of Service: [name] 06/16/2022 2:00 PM Medical Record Number: [id number] Patient Account Number: [id number] Date of Birth/Sex: Treating RN: 02-10-1976 (46 y.o. Iver Nestle, Jamie Primary Care Veryl Winemiller: Juluis Mire Other Clinician: Referring Daquann Merriott: Treating Quorra Rosene/Extender: Patrici Ranks in Treatment: 3 Vital Signs Time Taken: 14:19 Temperature (F): 98.2 Height (in): 66 Pulse (bpm): 134 Weight (lbs): 197 Respiratory Rate (breaths/min): 18 Body Mass Index (BMI): 31.8 Blood Pressure (mmHg): 118/75 Reference Range: 80 - 120 mg / dl Electronic Signature(s) Signed: 06/16/2022 4:59:13 PM By: Blanche East RN Entered By: Blanche East on 06/16/2022 14:20:01

## 2022-06-19 ENCOUNTER — Encounter (HOSPITAL_BASED_OUTPATIENT_CLINIC_OR_DEPARTMENT_OTHER): Payer: Self-pay | Admitting: General Surgery

## 2022-06-19 NOTE — Telephone Encounter (Signed)
Provider has paperwork

## 2022-06-19 NOTE — Telephone Encounter (Signed)
Eydi from [employer] called in to discuss status of paper work for [employer] capacity narrative. Claim #[id number]. Please call back to discuss

## 2022-06-19 NOTE — Progress Notes (Signed)
DARSI, FERRUCCI ([id number]) Visit Report for 06/19/2022 Chief Complaint Document Details Patient Name: Date of Service: [name], [name] 06/19/2022 3:30 PM Medical Record Number: [id number] Patient Account Number: [id number] Date of Birth/Sex: Treating RN: 1976/03/02 (46 y.o. [name] Primary Care Provider: Juluis Mire Other Clinician: Referring Provider: Treating Provider/Extender: Patrici Ranks in Treatment: 3 Information Obtained from: Patient Chief Complaint Patient seen for complaints of Non-Healing Wounds. Electronic Signature(s) Signed: 06/19/2022 4:28:05 PM By: Fredirick Maudlin MD FACS Entered By: Fredirick Maudlin on 06/19/2022 16:28:05 -------------------------------------------------------------------------------- Debridement Details Patient Name: Date of Service: Lori Hawking R. 06/19/2022 3:30 PM Medical Record Number: [id number] Patient Account Number: [id number] Date of Birth/Sex: Treating RN: October 13, 1975 (46 y.o. [name] Primary Care Provider: Juluis Mire Other Clinician: Referring Provider: Treating Provider/Extender: Patrici Ranks in Treatment: 3 Debridement Performed for Assessment: Wound #1 Left,Dorsal Foot Performed By: Physician Fredirick Maudlin, MD Debridement Type: Debridement Level of Consciousness (Pre-procedure): Awake and Alert Pre-procedure Verification/Time Out Yes - 16:19 Taken: Start Time: 16:20 Pain Control: Lidocaine 5% topical ointment T Area Debrided (L x W): otal 4.8 (cm) x 2.2 (cm) = 10.56 (cm) Tissue and other material debrided: Non-Viable, Slough, Subcutaneous, Fibrin/Exudate, Slough Level: Skin/Subcutaneous Tissue Debridement Description: Excisional Instrument: Curette Bleeding: Minimum Hemostasis Achieved: Pressure Procedural Pain: 0 Post Procedural Pain: 0 Response to Treatment: Procedure was tolerated well Level of Consciousness (Post-  Awake and Alert procedure): Post Debridement Measurements of Total Wound Length: (cm) 4.8 Width: (cm) 2.2 Depth: (cm) 0.6 Volume: (cm) 4.976 Character of Wound/Ulcer Post Debridement: Requires Further Debridement Post Procedure Diagnosis Same as Pre-procedure Notes Scribed for Dr. Celine Ahr by Blanche East, RN Electronic Signature(s) Signed: 06/19/2022 4:39:45 PM By: Fredirick Maudlin MD FACS Signed: 06/19/2022 5:20:46 PM By: Baruch Gouty RN, BSN Entered By: Fredirick Maudlin on 06/19/2022 16:39:45 -------------------------------------------------------------------------------- HPI Details Patient Name: Date of Service: Lori Hawking R. 06/19/2022 3:30 PM Medical Record Number: [id number] Patient Account Number: [id number] Date of Birth/Sex: Treating RN: [birth year] (46 y.o. [name] Primary Care Provider: Juluis Mire Other Clinician: Referring Provider: Treating Provider/Extender: Patrici Ranks in Treatment: 3 History of Present Illness HPI Description: ADMISSION 05/24/2022 This is a 46 year old woman with a past medical history significant for hypertension, alcoholic hepatitis, chronic anemia, chronic kidney disease, and peptic ulcer disease. She was admitted to the hospital in the middle of July for hypovolemic shock secondary to dehydration from gastroenteritis. She was found on admission to have an ulcer on her left buttock within the confines of the natal cleft and approaching the anal verge. The etiology was felt to be moisture and friction rather than pressure, as the patient is completely ambulatory. No debridement was performed. She was readmitted at the beginning of August with worsening lower extremity edema and fatigue. The wound was basically unchanged. Wound nursing was consulted and they recommended using silver alginate. She was discharged from the hospital with referral to the wound care center. This past weekend, she noted  a wound on her dorsal left foot that she thinks was secondary to friction from shoes. She is here for evaluation of both these wounds. ABI in clinic today was 1.18. She reports that she has quit drinking but she continues to smoke. On the dorsal surface of her left foot between her fourth and fifth metatarsal heads, there is a circular wound exposing the fat layer. There is some slough and eschar accumulation. It does appear consistent with her history of abrasion. On the  medial aspect of her left buttock extending into the natal cleft and towards the anal verge. There is a large ulcer with a very clean surface. There is substantial undermining at the cranial portion of the wound. No purulent drainage or malodor. 06/08/2022: The patient returns today with significant worsening of her dorsal foot wound. Apparently she saw her PCP yesterday who was concerned for cellulitis but did not prescribe any antibiotics. She was also sent for a DVT scan which was negative. There is heavy slough as well as liquefactive necrosis of the fat layer. There is a bulla between the second and third toes that looks as though it may rupture in the near future. The wound undermines under the skin towards this bulla. No frankly purulent drainage or any odor. Her gluteal wound is very clean but is relatively unchanged in size. She has been managing her wounds on her own and finds it difficult to pack the silver alginate into the undermined portion of the buttock wound. She says that she has been putting peroxide on her foot. 06/19/2022: The patient was not seen as a wound care visit last week because she arrived too late to her appointment. She did have a nursing visit, however. The culture that I took on 14 September grew out a polymicrobial population including Bacteroides fragilis Citrobacter Morganella Escherichia coli Enterococcus faecalis Pseudomonas aeruginosa and many others. I prescribed Bactrim and Augmentin for this  polymicrobial infection based upon the resistance genes detected. Keystone topical compounded antibiotic was also ordered and she has been using that on her foot as well. Unfortunately, the wound on her dorsal foot continues to deteriorate. There is a larger area of undermining and more necrotic tissue, including muscle, on the wound surface. No significant malodor or frank pus. Her gluteal wound, on the other hand, looks quite good. It is smaller and very clean and the undermined portion has contracted substantially. Electronic Signature(s) Signed: 06/19/2022 4:30:24 PM By: Fredirick Maudlin MD FACS Entered By: Fredirick Maudlin on 06/19/2022 16:30:24 -------------------------------------------------------------------------------- Physical Exam Details Patient Name: Date of Service: [name] 06/19/2022 3:30 PM Medical Record Number: [id number] Patient Account Number: [id number] Date of Birth/Sex: Treating RN: Apr 10, 1976 (46 y.o. [name] Primary Care Provider: Juluis Mire Other Clinician: Referring Provider: Treating Provider/Extender: Patrici Ranks in Treatment: 3 Constitutional .Tachycardic. . . No acute distress.Marland Kitchen Respiratory Normal work of breathing on room air.. Notes 06/19/2022: The wound on her dorsal foot continues to deteriorate. There is a larger area of undermining and more necrotic tissue, including muscle, on the wound surface. No significant malodor or frank pus. Her gluteal wound, on the other hand, looks quite good. It is smaller and very clean and the undermined portion has contracted substantially. Electronic Signature(s) Signed: 06/19/2022 4:34:05 PM By: Fredirick Maudlin MD FACS Entered By: Fredirick Maudlin on 06/19/2022 16:34:04 -------------------------------------------------------------------------------- Physician Orders Details Patient Name: Date of Service: Lori Hawking R. 06/19/2022 3:30 PM Medical Record  Number: [id number] Patient Account Number: [id number] Date of Birth/Sex: Treating RN: [birth year] (46 y.o. Iver Nestle, Jamie Primary Care Provider: Juluis Mire Other Clinician: Referring Provider: Treating Provider/Extender: Patrici Ranks in Treatment: 3 Verbal / Phone Orders: No Diagnosis Coding ICD-10 Coding Code Description [phone] Non-pressure chronic ulcer of other part of left foot with fat layer exposed L98.412 Non-pressure chronic ulcer of buttock with fat layer exposed N18.9 Chronic kidney disease, unspecified [id number] Alcoholic hepatitis without ascites Follow-up Appointments ppointment in 1 week. - Dr. Celine Ahr - Rm  4 Return A Anesthetic Wound #1 Left,Dorsal Foot (In clinic) Topical Lidocaine 5% applied to wound bed Wound #2 Left Gluteal fold (In clinic) Topical Lidocaine 5% applied to wound bed Bathing/ Shower/ Hygiene May shower and wash wound with soap and water. - with dressing changes Edema Control - Lymphedema / SCD / Other Elevate legs to the level of the heart or above for 30 minutes daily and/or when sitting, a frequency of: Avoid standing for long periods of time. Wound Treatment Wound #1 - Foot Wound Laterality: Dorsal, Left Cleanser: Soap and Water Every Other Day/30 Days Discharge Instructions: May shower and wash wound with dial antibacterial soap and water prior to dressing change. Cleanser: Wound Cleanser Every Other Day/30 Days Discharge Instructions: Cleanse the wound with wound cleanser prior to applying a clean dressing using gauze sponges, not tissue or cotton balls. Topical: Keystone Every Other Day/30 Days Prim Dressing: KerraCel Ag Gelling Fiber Dressing, 2x2 in (silver alginate) Every Other Day/30 Days ary Discharge Instructions: Apply silver alginate to wound bed as instructed Secondary Dressing: Woven Gauze Sponge, Non-Sterile 4x4 in Every Other Day/30 Days Discharge Instructions: Apply over primary dressing as  directed. Secured With: Elastic Bandage 4 inch (ACE bandage) Every Other Day/30 Days Discharge Instructions: Secure with ACE bandage as directed. Secured With: [employer], 4.5x3.1 (in/yd) Every Other Day/30 Days Discharge Instructions: Secure with Kerlix as directed. Wound #2 - Gluteal fold Wound Laterality: Left Cleanser: Soap and Water 1 x Per Day/30 Days Discharge Instructions: May shower and wash wound with dial antibacterial soap and water prior to dressing change. Cleanser: Wound Cleanser 1 x Per Day/30 Days Discharge Instructions: Cleanse the wound with wound cleanser prior to applying a clean dressing using gauze sponges, not tissue or cotton balls. Prim Dressing: KerraCel Ag Gelling Fiber Dressing, 4x5 in (silver alginate) 1 x Per Day/30 Days ary Discharge Instructions: Apply silver alginate to wound bed as instructed Secondary Dressing: ABD Pad, 5x9 1 x Per Day/30 Days Discharge Instructions: Apply over primary dressing as directed. Secondary Dressing: Woven Gauze Sponge, Non-Sterile 4x4 in 1 x Per Day/30 Days Discharge Instructions: Apply over primary dressing as directed. Electronic Signature(s) Signed: 06/19/2022 4:41:04 PM By: Fredirick Maudlin MD FACS Entered By: Fredirick Maudlin on 06/19/2022 16:36:01 -------------------------------------------------------------------------------- Problem List Details Patient Name: Date of Service: Lori Hawking R. 06/19/2022 3:30 PM Medical Record Number: [id number] Patient Account Number: [id number] Date of Birth/Sex: Treating RN: [birth year] (46 y.o. [name] Primary Care Provider: Juluis Mire Other Clinician: Referring Provider: Treating Provider/Extender: Patrici Ranks in Treatment: 3 Active Problems ICD-10 Encounter Code Description Active Date MDM Diagnosis [phone] Non-pressure chronic ulcer of other part of left foot with fat layer exposed 05/24/2022 No Yes L98.412  Non-pressure chronic ulcer of buttock with fat layer exposed 05/24/2022 No Yes N18.9 Chronic kidney disease, unspecified 05/24/2022 No Yes [id number] Alcoholic hepatitis without ascites 05/24/2022 No Yes Inactive Problems Resolved Problems Electronic Signature(s) Signed: 06/19/2022 4:14:23 PM By: Fredirick Maudlin MD FACS Entered By: Fredirick Maudlin on 06/19/2022 16:14:22 -------------------------------------------------------------------------------- Progress Note Details Patient Name: Date of Service: Lori Hawking R. 06/19/2022 3:30 PM Medical Record Number: [id number] Patient Account Number: [id number] Date of Birth/Sex: Treating RN: 07/12/1976 (46 y.o. [name] Primary Care Provider: Juluis Mire Other Clinician: Referring Provider: Treating Provider/Extender: Patrici Ranks in Treatment: 3 Subjective Chief Complaint Information obtained from Patient Patient seen for complaints of Non-Healing Wounds. History of Present Illness (HPI) ADMISSION 05/24/2022 This is a 46 year old woman with a  past medical history significant for hypertension, alcoholic hepatitis, chronic anemia, chronic kidney disease, and peptic ulcer disease. She was admitted to the hospital in the middle of July for hypovolemic shock secondary to dehydration from gastroenteritis. She was found on admission to have an ulcer on her left buttock within the confines of the natal cleft and approaching the anal verge. The etiology was felt to be moisture and friction rather than pressure, as the patient is completely ambulatory. No debridement was performed. She was readmitted at the beginning of August with worsening lower extremity edema and fatigue. The wound was basically unchanged. Wound nursing was consulted and they recommended using silver alginate. She was discharged from the hospital with referral to the wound care center. This past weekend, she noted a wound on her dorsal left  foot that she thinks was secondary to friction from shoes. She is here for evaluation of both these wounds. ABI in clinic today was 1.18. She reports that she has quit drinking but she continues to smoke. On the dorsal surface of her left foot between her fourth and fifth metatarsal heads, there is a circular wound exposing the fat layer. There is some slough and eschar accumulation. It does appear consistent with her history of abrasion. On the medial aspect of her left buttock extending into the natal cleft and towards the anal verge. There is a large ulcer with a very clean surface. There is substantial undermining at the cranial portion of the wound. No purulent drainage or malodor. 06/08/2022: The patient returns today with significant worsening of her dorsal foot wound. Apparently she saw her PCP yesterday who was concerned for cellulitis but did not prescribe any antibiotics. She was also sent for a DVT scan which was negative. There is heavy slough as well as liquefactive necrosis of the fat layer. There is a bulla between the second and third toes that looks as though it may rupture in the near future. The wound undermines under the skin towards this bulla. No frankly purulent drainage or any odor. Her gluteal wound is very clean but is relatively unchanged in size. She has been managing her wounds on her own and finds it difficult to pack the silver alginate into the undermined portion of the buttock wound. She says that she has been putting peroxide on her foot. 06/19/2022: The patient was not seen as a wound care visit last week because she arrived too late to her appointment. She did have a nursing visit, however. The culture that I took on 14 September grew out a polymicrobial population including Bacteroides fragilis Citrobacter Morganella Escherichia coli Enterococcus faecalis Pseudomonas aeruginosa and many others. I prescribed Bactrim and Augmentin for this polymicrobial infection  based upon the resistance genes detected. Keystone topical compounded antibiotic was also ordered and she has been using that on her foot as well. Unfortunately, the wound on her dorsal foot continues to deteriorate. There is a larger area of undermining and more necrotic tissue, including muscle, on the wound surface. No significant malodor or frank pus. Her gluteal wound, on the other hand, looks quite good. It is smaller and very clean and the undermined portion has contracted substantially. Patient History Information obtained from Patient. Family History Cancer - Mother, Diabetes - Mother, Hypertension - Mother,Maternal Grandparents,Paternal Grandparents, No family history of Heart Disease, Hereditary Spherocytosis, Kidney Disease, Lung Disease, Seizures, Stroke, Thyroid Problems, Tuberculosis. Social History Current every day smoker - .5 pack a day, Marital Status - Single, Alcohol Use - Never, Drug  Use - No History, Caffeine Use - Daily. Medical History Hematologic/Lymphatic Patient has history of Anemia, Lymphedema Cardiovascular Patient has history of Hypertension, Hypotension Gastrointestinal Patient has history of Cirrhosis Endocrine Denies history of Type I Diabetes, Type II Diabetes Hospitalization/Surgery History - esophagogastroduodenoscopy. - biopsy. - cholecystectomy. - breast biopsy. - abdominoplasty. - tummy tuck. - hernia repair. - roux-en-y procedure. - tumor removal. - wrist ganglion incision. Medical A Surgical History Notes nd Gastrointestinal slow GI blood loss, alcoholic hepatitis Genitourinary CKD stage III Psychiatric depression Objective Constitutional Tachycardic. No acute distress.. Vitals Time Taken: 4:00 PM, Height: 66 in, Weight: 197 lbs, BMI: 31.8, Temperature: 98.3 F, Pulse: 121 bpm, Respiratory Rate: 18 breaths/min, Blood Pressure: 109/69 mmHg. Respiratory Normal work of breathing on room air.. General Notes: 06/19/2022: The wound on her  dorsal foot continues to deteriorate. There is a larger area of undermining and more necrotic tissue, including muscle, on the wound surface. No significant malodor or frank pus. Her gluteal wound, on the other hand, looks quite good. It is smaller and very clean and the undermined portion has contracted substantially. Integumentary (Hair, Skin) Wound #1 status is Open. Original cause of wound was Shear/Friction. The date acquired was: 05/20/2022. The wound has been in treatment 3 weeks. The wound is located on the Left,Dorsal Foot. The wound measures 4.8cm length x 2.2cm width x 0.6cm depth; 8.294cm^2 area and 4.976cm^3 volume. There is Fat Layer (Subcutaneous Tissue) exposed. There is no tunneling noted, however, there is undermining starting at 12:00 and ending at 12:00 with a maximum distance of 3.5cm. There is a medium amount of serosanguineous drainage noted. There is small (1-33%) red, pink granulation within the wound bed. There is a large (67-100%) amount of necrotic tissue within the wound bed including Eschar and Adherent Slough. Wound #2 status is Open. Original cause of wound was Shear/Friction. The date acquired was: 03/20/2022. The wound has been in treatment 3 weeks. The wound is located on the Left Gluteal fold. The wound measures 6cm length x 6cm width x 0.1cm depth; 28.274cm^2 area and 2.827cm^3 volume. Tunneling has been noted at 12:00 with a maximum distance of 4.5cm. Undermining begins at 12:00 and ends at 12:00 with a maximum distance of 2cm. There is a medium amount of serosanguineous drainage noted. There is medium (34-66%) red, pink granulation within the wound bed. There is a medium (34-66%) amount of necrotic tissue within the wound bed including Adherent Slough. 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 There was a Excisional Skin/Subcutaneous Tissue Debridement with a total area of 10.56 sq cm performed by Fredirick Maudlin, MD. With the following instrument(s): Curette to remove Non-Viable tissue/material. Material removed includes Subcutaneous Tissue, Slough, and Fibrin/Exudate after achieving pain control using Lidocaine 5% topical ointment. No specimens were taken. A time out was conducted at 16:19, prior to the start of the procedure. A Minimum amount of bleeding was controlled with Pressure. The procedure was tolerated well with a pain level of 0 throughout and a pain level of 0 following the procedure. Post Debridement Measurements: 4.8cm length x 2.2cm width x 0.6cm depth; 4.976cm^3 volume. Character of Wound/Ulcer Post Debridement requires further debridement. Post procedure Diagnosis Wound #1: Same as Pre-Procedure General Notes: Scribed for Dr. Celine Ahr by  Blanche East, RN. Plan Follow-up Appointments: Return Appointment in 1 week. - Dr. Celine Ahr - Rm 4 Anesthetic: Wound #1 Left,Dorsal Foot: (In clinic) Topical Lidocaine 5% applied to wound bed Wound #2 Left Gluteal fold: (In clinic) Topical Lidocaine 5% applied to wound bed Bathing/ Shower/ Hygiene: May shower and wash wound with soap and water. - with dressing changes Edema Control - Lymphedema / SCD / Other: Elevate legs to the level of the heart or above for 30 minutes daily and/or when sitting, a frequency of: Avoid standing for long periods of time. WOUND #1: - Foot Wound Laterality: Dorsal, Left Cleanser: Soap and Water Every Other Day/30 Days Discharge Instructions: May shower and wash wound with dial antibacterial soap and water prior to dressing change. Cleanser: Wound Cleanser Every Other Day/30 Days Discharge Instructions: Cleanse the wound with wound cleanser  prior to applying a clean dressing using gauze sponges, not tissue or cotton balls. Topical: Keystone Every Other Day/30 Days Prim Dressing: KerraCel Ag Gelling Fiber Dressing, 2x2 in (silver alginate) Every Other Day/30 Days ary Discharge Instructions: Apply silver alginate to wound bed as instructed Secondary Dressing: Woven Gauze Sponge, Non-Sterile 4x4 in Every Other Day/30 Days Discharge Instructions: Apply over primary dressing as directed. Secured With: Elastic Bandage 4 inch (ACE bandage) Every Other Day/30 Days Discharge Instructions: Secure with ACE bandage as directed. Secured With: [employer], 4.5x3.1 (in/yd) Every Other Day/30 Days Discharge Instructions: Secure with Kerlix as directed. WOUND #2: - Gluteal fold Wound Laterality: Left Cleanser: Soap and Water 1 x Per Day/30 Days Discharge Instructions: May shower and wash wound with dial antibacterial soap and water prior to dressing change. Cleanser: Wound Cleanser 1 x Per Day/30 Days Discharge Instructions: Cleanse the wound with wound cleanser prior to applying a clean dressing using gauze sponges, not tissue or cotton balls. Prim Dressing: KerraCel Ag Gelling Fiber Dressing, 4x5 in (silver alginate) 1 x Per Day/30 Days ary Discharge Instructions: Apply silver alginate to wound bed as instructed Secondary Dressing: ABD Pad, 5x9 1 x Per Day/30 Days Discharge Instructions: Apply over primary dressing as directed. Secondary Dressing: Woven Gauze Sponge, Non-Sterile 4x4 in 1 x Per Day/30 Days Discharge Instructions: Apply over primary dressing as directed. 06/19/2022: The wound on her dorsal foot continues to deteriorate. There is a larger area of undermining and more necrotic tissue, including muscle, on the wound surface. No significant malodor or frank pus. Her gluteal wound, on the other hand, looks quite good. It is smaller and very clean and the undermined portion has contracted substantially. I attempted to debride  her foot wound, but was only able to get a little bit of slough and nonviable subcutaneous tissue. I think she requires surgical debridement and likely IV antibiotic therapy as oral antibiotics and her Keystone topical compounded antibiotic do not seem to be having any significant effect on the wound. I have recommended that she present herself to the emergency room for treatment that we cannot provide here in clinic. Her gluteal wound actually looks great and she will continue to pack this with silver alginate. Follow-up in 1 week. Electronic Signature(s) Signed: 06/19/2022 4:40:01 PM By: Fredirick Maudlin MD FACS Previous Signature: 06/19/2022 4:39:07 PM Version By: Fredirick Maudlin MD FACS Entered By: Fredirick Maudlin on 06/19/2022 16:40:01 -------------------------------------------------------------------------------- HxROS Details Patient Name: Date of Service: Lori Hawking R. 06/19/2022 3:30 PM Medical Record Number: [id number] Patient Account Number: [id number] Date of Birth/Sex: Treating RN: [birth year] (46 y.o. [name] Primary Care Provider: Juluis Mire Other Clinician:  Referring Provider: Treating Provider/Extender: Patrici Ranks in Treatment: 3 Information Obtained From Patient Hematologic/Lymphatic Medical History: Positive for: Anemia; Lymphedema Cardiovascular Medical History: Positive for: Hypertension; Hypotension Gastrointestinal Medical History: Positive for: Cirrhosis Past Medical History Notes: slow GI blood loss, alcoholic hepatitis Endocrine Medical History: Negative for: Type I Diabetes; Type II Diabetes Genitourinary Medical History: Past Medical History Notes: CKD stage III Psychiatric Medical History: Past Medical History Notes: depression Immunizations Pneumococcal Vaccine: Received Pneumococcal Vaccination: No Implantable Devices None Hospitalization / Surgery History Type of  Hospitalization/Surgery esophagogastroduodenoscopy biopsy cholecystectomy breast biopsy abdominoplasty tummy tuck hernia repair roux-en-y procedure tumor removal wrist ganglion incision Family and Social History Cancer: Yes - Mother; Diabetes: Yes - Mother; Heart Disease: No; Hereditary Spherocytosis: No; Hypertension: Yes - Mother,Maternal Grandparents,Paternal Grandparents; Kidney Disease: No; Lung Disease: No; Seizures: No; Stroke: No; Thyroid Problems: No; Tuberculosis: No; Current every day smoker - .5 pack a day; Marital Status - Single; Alcohol Use: Never; Drug Use: No History; Caffeine Use: Daily; Financial Concerns: Yes; Food, Clothing or Shelter Needs: No; Support System Lacking: No; Transportation Concerns: No Engineer, maintenance) Signed: 06/19/2022 4:41:04 PM By: Fredirick Maudlin MD FACS Signed: 06/19/2022 5:20:46 PM By: Baruch Gouty RN, BSN Entered By: Fredirick Maudlin on 06/19/2022 16:32:29 -------------------------------------------------------------------------------- SuperBill Details Patient Name: Date of Service: [name] 06/19/2022 Medical Record Number: [id number] Patient Account Number: [id number] Date of Birth/Sex: Treating RN: 07/06/1976 (46 y.o. 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 N18.9 Chronic kidney disease, unspecified [id number] Alcoholic hepatitis without ascites Facility Procedures CPT4 Code: [id number] Description: [id number] - DEB SUBQ TISSUE 20 SQ CM/< ICD-10 Diagnosis Description L97.522 Non-pressure chronic ulcer of other part of left foot with fat layer exposed Modifier: Quantity:  1 Physician Procedures : CPT4 Code Description Modifier [id number] - WC PHYS LEVEL 4 - EST PT 25 ICD-10 Diagnosis Description L97.522 Non-pressure chronic ulcer of other part of left foot with fat layer exposed L98.412 Non-pressure chronic ulcer of buttock with fat layer  exposed N18.9 Chronic kidney disease, unspecified [id number] Alcoholic hepatitis without ascites Quantity: 1 : [id number] - WC PHYS SUBQ TISS 20 SQ CM ICD-10 Diagnosis Description L97.522 Non-pressure chronic ulcer of other part of left foot with fat layer exposed Quantity: 1 Electronic Signature(s) Signed: 06/19/2022 4:40:55 PM By: Fredirick Maudlin MD FACS Entered By: Fredirick Maudlin on 06/19/2022 16:40:55

## 2022-06-20 ENCOUNTER — Emergency Department (HOSPITAL_COMMUNITY): Payer: Self-pay

## 2022-06-20 ENCOUNTER — Encounter (HOSPITAL_COMMUNITY): Payer: Self-pay

## 2022-06-20 ENCOUNTER — Other Ambulatory Visit: Payer: Self-pay

## 2022-06-20 ENCOUNTER — Inpatient Hospital Stay (HOSPITAL_COMMUNITY)
Admission: EM | Admit: 2022-06-20 | Discharge: 2022-06-23 | DRG: 577 | Disposition: A | Discharge status: Home / Self Care | Payer: Self-pay | Attending: Student | Admitting: Student

## 2022-06-20 ENCOUNTER — Inpatient Hospital Stay (HOSPITAL_COMMUNITY): Payer: Self-pay

## 2022-06-20 DIAGNOSIS — E871 Hypo-osmolality and hyponatremia: Secondary | ICD-10-CM | POA: Diagnosis present

## 2022-06-20 DIAGNOSIS — Z9884 Bariatric surgery status: Secondary | ICD-10-CM

## 2022-06-20 DIAGNOSIS — I10 Essential (primary) hypertension: Secondary | ICD-10-CM | POA: Diagnosis present

## 2022-06-20 DIAGNOSIS — N179 Acute kidney failure, unspecified: Secondary | ICD-10-CM | POA: Diagnosis present

## 2022-06-20 DIAGNOSIS — L97923 Non-pressure chronic ulcer of unspecified part of left lower leg with necrosis of muscle: Secondary | ICD-10-CM

## 2022-06-20 DIAGNOSIS — K7030 Alcoholic cirrhosis of liver without ascites: Secondary | ICD-10-CM | POA: Diagnosis present

## 2022-06-20 DIAGNOSIS — S91302A Unspecified open wound, left foot, initial encounter: Principal | ICD-10-CM | POA: Diagnosis present

## 2022-06-20 DIAGNOSIS — K219 Gastro-esophageal reflux disease without esophagitis: Secondary | ICD-10-CM | POA: Diagnosis present

## 2022-06-20 DIAGNOSIS — L97525 Non-pressure chronic ulcer of other part of left foot with muscle involvement without evidence of necrosis: Secondary | ICD-10-CM

## 2022-06-20 DIAGNOSIS — L97529 Non-pressure chronic ulcer of other part of left foot with unspecified severity: Secondary | ICD-10-CM | POA: Diagnosis present

## 2022-06-20 DIAGNOSIS — L089 Local infection of the skin and subcutaneous tissue, unspecified: Secondary | ICD-10-CM | POA: Diagnosis present

## 2022-06-20 DIAGNOSIS — Z720 Tobacco use: Secondary | ICD-10-CM | POA: Diagnosis present

## 2022-06-20 DIAGNOSIS — G621 Alcoholic polyneuropathy: Secondary | ICD-10-CM | POA: Diagnosis present

## 2022-06-20 DIAGNOSIS — X58XXXA Exposure to other specified factors, initial encounter: Secondary | ICD-10-CM | POA: Diagnosis present

## 2022-06-20 DIAGNOSIS — Z91048 Other nonmedicinal substance allergy status: Secondary | ICD-10-CM

## 2022-06-20 DIAGNOSIS — F17210 Nicotine dependence, cigarettes, uncomplicated: Secondary | ICD-10-CM | POA: Diagnosis present

## 2022-06-20 DIAGNOSIS — D509 Iron deficiency anemia, unspecified: Secondary | ICD-10-CM | POA: Diagnosis present

## 2022-06-20 DIAGNOSIS — K7010 Alcoholic hepatitis without ascites: Secondary | ICD-10-CM | POA: Diagnosis present

## 2022-06-20 DIAGNOSIS — E872 Acidosis, unspecified: Secondary | ICD-10-CM | POA: Diagnosis present

## 2022-06-20 DIAGNOSIS — Z79899 Other long term (current) drug therapy: Secondary | ICD-10-CM

## 2022-06-20 DIAGNOSIS — Z20822 Contact with and (suspected) exposure to covid-19: Secondary | ICD-10-CM | POA: Diagnosis present

## 2022-06-20 LAB — HEPATIC FUNCTION PANEL
ALT: 34 U/L (ref 0–44)
AST: 105 U/L — ABNORMAL HIGH (ref 15–41)
Albumin: 2.3 g/dL — ABNORMAL LOW (ref 3.5–5.0)
Alkaline Phosphatase: 167 U/L — ABNORMAL HIGH (ref 38–126)
Bilirubin, Direct: 1.4 mg/dL — ABNORMAL HIGH (ref 0.0–0.2)
Indirect Bilirubin: 1.6 mg/dL — ABNORMAL HIGH (ref 0.3–0.9)
Total Bilirubin: 3 mg/dL — ABNORMAL HIGH (ref 0.3–1.2)
Total Protein: 7.6 g/dL (ref 6.5–8.1)

## 2022-06-20 LAB — BASIC METABOLIC PANEL
Anion gap: 9 (ref 5–15)
BUN: 12 mg/dL (ref 6–20)
CO2: 18 mmol/L — ABNORMAL LOW (ref 22–32)
Calcium: 8.5 mg/dL — ABNORMAL LOW (ref 8.9–10.3)
Chloride: 105 mmol/L (ref 98–111)
Creatinine, Ser: 1.79 mg/dL — ABNORMAL HIGH (ref 0.44–1.00)
GFR, Estimated: 35 mL/min — ABNORMAL LOW (ref >60)
Glucose, Bld: 89 mg/dL (ref 70–99)
Potassium: 3.7 mmol/L (ref 3.5–5.1)
Sodium: 132 mmol/L — ABNORMAL LOW (ref 135–145)

## 2022-06-20 LAB — CBC WITH DIFFERENTIAL/PLATELET
Abs Immature Granulocytes: 0.05 10*3/uL (ref 0.00–0.07)
Basophils Absolute: 0.1 10*3/uL (ref 0.0–0.1)
Basophils Relative: 1 %
Eosinophils Absolute: 0.1 10*3/uL (ref 0.0–0.5)
Eosinophils Relative: 1 %
HCT: 28 % — ABNORMAL LOW (ref 36.0–46.0)
Hemoglobin: 9.1 g/dL — ABNORMAL LOW (ref 12.0–15.0)
Immature Granulocytes: 1 %
Lymphocytes Relative: 16 %
Lymphs Abs: 1.6 10*3/uL (ref 0.7–4.0)
MCH: 31.8 pg (ref 26.0–34.0)
MCHC: 32.5 g/dL (ref 30.0–36.0)
MCV: 97.9 fL (ref 80.0–100.0)
Monocytes Absolute: 0.7 10*3/uL (ref 0.1–1.0)
Monocytes Relative: 7 %
Neutro Abs: 7.1 10*3/uL (ref 1.7–7.7)
Neutrophils Relative %: 74 %
Platelets: 127 10*3/uL — ABNORMAL LOW (ref 150–400)
RBC: 2.86 MIL/uL — ABNORMAL LOW (ref 3.87–5.11)
RDW: 16 % — ABNORMAL HIGH (ref 11.5–15.5)
WBC: 9.6 10*3/uL (ref 4.0–10.5)
nRBC: 0 % (ref 0.0–0.2)

## 2022-06-20 LAB — I-STAT BETA HCG BLOOD, ED (MC, WL, AP ONLY): I-stat hCG, quantitative: <5 m[IU]/mL (ref <5)

## 2022-06-20 LAB — RESP PANEL BY RT-PCR (FLU A&B, COVID) ARPGX2
Influenza A by PCR: NEGATIVE
Influenza B by PCR: NEGATIVE
SARS Coronavirus 2 by RT PCR: NEGATIVE

## 2022-06-20 LAB — PROTIME-INR
INR: 1.7 — ABNORMAL HIGH (ref 0.8–1.2)
Prothrombin Time: 19.4 seconds — ABNORMAL HIGH (ref 11.4–15.2)

## 2022-06-20 LAB — LACTIC ACID, PLASMA
Lactic Acid, Venous: 3.2 mmol/L (ref 0.5–1.9)
Lactic Acid, Venous: 3.7 mmol/L (ref 0.5–1.9)
Lactic Acid, Venous: 2.9 mmol/L (ref 0.5–1.9)
Lactic Acid, Venous: 2.8 mmol/L (ref 0.5–1.9)

## 2022-06-20 LAB — SEDIMENTATION RATE: Sed Rate: 110 mm/hr — ABNORMAL HIGH (ref 0–22)

## 2022-06-20 LAB — CK: Total CK: 21 U/L — ABNORMAL LOW (ref 38–234)

## 2022-06-20 LAB — APTT: aPTT: 33 seconds (ref 24–36)

## 2022-06-20 LAB — BRAIN NATRIURETIC PEPTIDE: B Natriuretic Peptide: 82 pg/mL (ref 0.0–100.0)

## 2022-06-20 LAB — MAGNESIUM: Magnesium: 1.3 mg/dL — ABNORMAL LOW (ref 1.7–2.4)

## 2022-06-20 LAB — ETHANOL: Alcohol, Ethyl (B): <10 mg/dL (ref <10)

## 2022-06-20 LAB — TSH: TSH: 3.633 u[IU]/mL (ref 0.350–4.500)

## 2022-06-20 MED ORDER — SODIUM CHLORIDE 0.9 % IV SOLN
2.0000 g | Freq: Two times a day (BID) | INTRAVENOUS | Status: DC
  Administered 2022-06-21 – 2022-06-22 (×3): 2 g via INTRAVENOUS
  Filled 2022-06-20 (×4): qty 12.5

## 2022-06-20 MED ORDER — PANTOPRAZOLE SODIUM 40 MG IV SOLR
40.0000 mg | Freq: Two times a day (BID) | INTRAVENOUS | Status: DC
  Administered 2022-06-20 – 2022-06-22 (×4): 40 mg via INTRAVENOUS
  Filled 2022-06-20 (×4): qty 10

## 2022-06-20 MED ORDER — LACTATED RINGERS IV SOLN: INTRAVENOUS | Status: AC

## 2022-06-20 MED ORDER — LACTATED RINGERS IV BOLUS: 1000.0000 mL | Freq: Once | INTRAVENOUS | Status: DC

## 2022-06-20 MED ORDER — PNEUMOCOCCAL 20-VAL CONJ VACC 0.5 ML IM SUSY
0.5000 mL | PREFILLED_SYRINGE | INTRAMUSCULAR | Status: DC
  Filled 2022-06-20: qty 0.5

## 2022-06-20 MED ORDER — VANCOMYCIN HCL 1500 MG/300ML IV SOLN
1500.0000 mg | Freq: Once | INTRAVENOUS | Status: DC
  Filled 2022-06-20: qty 300

## 2022-06-20 MED ORDER — ONDANSETRON HCL 4 MG/2ML IJ SOLN
4.0000 mg | Freq: Four times a day (QID) | INTRAMUSCULAR | Status: DC | PRN
  Administered 2022-06-21: 4 mg via INTRAVENOUS

## 2022-06-20 MED ORDER — NALOXONE HCL 0.4 MG/ML IJ SOLN: 0.4000 mg | INTRAMUSCULAR | Status: DC | PRN

## 2022-06-20 MED ORDER — ACETAMINOPHEN 650 MG RE SUPP: 650.0000 mg | Freq: Four times a day (QID) | RECTAL | Status: DC | PRN

## 2022-06-20 MED ORDER — SODIUM CHLORIDE 0.9 % IV SOLN
2.0000 g | Freq: Once | INTRAVENOUS | Status: AC
  Administered 2022-06-20: 2 g via INTRAVENOUS
  Filled 2022-06-20: qty 12.5

## 2022-06-20 MED ORDER — NICOTINE 14 MG/24HR TD PT24: 14.0000 mg | MEDICATED_PATCH | Freq: Every day | TRANSDERMAL | Status: DC | PRN

## 2022-06-20 MED ORDER — VANCOMYCIN HCL 1500 MG/300ML IV SOLN: 1500.0000 mg | INTRAVENOUS | Status: DC

## 2022-06-20 MED ORDER — METRONIDAZOLE 500 MG/100ML IV SOLN
500.0000 mg | Freq: Two times a day (BID) | INTRAVENOUS | Status: DC
  Administered 2022-06-21 – 2022-06-22 (×2): 500 mg via INTRAVENOUS
  Filled 2022-06-20 (×3): qty 100

## 2022-06-20 MED ORDER — MIDODRINE HCL 5 MG PO TABS
10.0000 mg | ORAL_TABLET | Freq: Three times a day (TID) | ORAL | Status: DC
  Administered 2022-06-22 – 2022-06-23 (×5): 10 mg via ORAL
  Filled 2022-06-20 (×6): qty 2

## 2022-06-20 MED ORDER — FENTANYL CITRATE PF 50 MCG/ML IJ SOSY
25.0000 ug | PREFILLED_SYRINGE | INTRAMUSCULAR | Status: DC | PRN
  Administered 2022-06-20 – 2022-06-23 (×4): 25 ug via INTRAVENOUS
  Filled 2022-06-20 (×4): qty 1

## 2022-06-20 MED ORDER — VANCOMYCIN HCL IN DEXTROSE 1-5 GM/200ML-% IV SOLN: 1000.0000 mg | Freq: Once | INTRAVENOUS | Status: DC

## 2022-06-20 MED ORDER — METRONIDAZOLE 500 MG/100ML IV SOLN
500.0000 mg | Freq: Once | INTRAVENOUS | Status: AC
  Administered 2022-06-21: 500 mg via INTRAVENOUS
  Filled 2022-06-20: qty 100

## 2022-06-20 MED ORDER — ACETAMINOPHEN 325 MG PO TABS: 650.0000 mg | ORAL_TABLET | Freq: Four times a day (QID) | ORAL | Status: DC | PRN

## 2022-06-20 MED ORDER — INFLUENZA VAC SPLIT QUAD 0.5 ML IM SUSY: 0.5000 mL | PREFILLED_SYRINGE | INTRAMUSCULAR | Status: DC

## 2022-06-20 NOTE — ED Notes (Signed)
Unsuccessful [place] PIV in L AC.

## 2022-06-20 NOTE — ED Notes (Signed)
Pt in MRI.

## 2022-06-20 NOTE — H&P (Signed)
History and Physical    PLEASE NOTE THAT DRAGON DICTATION SOFTWARE WAS USED IN THE CONSTRUCTION OF THIS NOTE.   [name] HYI:[id number] DOB: 1975/09/28 DOA: 06/20/2022  PCP: Kerin Perna, NP  Patient coming from: home   I have personally briefly reviewed patient's old medical records in [place]  Chief Complaint: Worsening of chronic left foot wound  HPI: [name] is a 46 y.o. female with medical history significant for chronic left foot wound, alcoholic hepatitis, GERD, chronic iron deficiency anemia associated baseline hemoglobin range 7-10, chronic tobacco abuse, who is admitted to [facility] on 06/20/2022 with infected left foot wound after presenting from home to [facility] ED complaining of worsening of left foot wound.   The patient has had a chronic left foot wound associated with the lateral dorsal aspect of her left fore foot since the time of a fall in June 2023.  In the interval, she has been compliantly/routinely following up in wound care clinic but is experienced interval worsening of her left foot wound in spite of her compliant attendance in wound care clinic as well as following at least 3-4 antibiotics including ciprofloxacin and Flagyl, amoxicillin.  Specifically, over the last week, she is noted increase in swelling associated with the dorsal left foot ulcer, and is noted associated increase in surrounding erythema, tenderness, increased warmth.  Has not noted significant increase in drainage associated this lesion, and denies any associated purulent drainage.  Denies any subjective fever, chills, rigors, or generalized myalgias.  No new numbness or paresthesias.  No interval trauma to the left lower extremity.  Denies any new rash in any other location.  Over the last 4 to 5 days, has noted some worsening nausea resulting in 1-2 daily episodes of nonbloody, nonbilious emesis over that timeframe, this physician episode occurring earlier today.   She notes chronic loose stool, without any recent worsening in frequency or volume.  No melena or hematochezia.  No new abdominal discomfort.  The patient had routine follow-up in wound care clinic earlier today, at which time providers that location also noted interval worsening in her left foot wound, and recommended that she present to the emergency department for further evaluation and management thereof.  Denies any recent chest pain, shortness of breath, palpitations, diaphoresis, dizziness Presyncope, or syncope.  Not on any blood thinners as an outpatient.  Of note, she underwent echocardiogram in July 2023, showing hyperdynamic left ventricle systole with LVEF greater than 75%, no focal motion abnormalities, normal diastolic parents, normal right ventricular systolic function, trivial mitral vegetation.  She has a history of alcoholic otitis, and was hospitalized in August 2023 for acute alcoholic hepatitis, with interval completion of a 1 month course of prednisolone.  She conveys that she is abstain from alcohol in the interval, and denies use of recreational drugs, though she does continue to smoke cigarettes on a daily basis.  Per chart review, most recent liver enzymes were checked on 05/16/2022 and were notable for the following: Alkaline phosphatase 300, AST 43, ALT 56, total bilirubin 2.9.    ED Course:  Vital signs in the ED were notable for the following: Afebrile; initial heart rate 115, subsequently decreased to 91 following interval IV fluids; systolic blood pressures in the low 100s to 130s; respiratory rate 17-21; oxygen saturation 96 to 100% on room air.  Labs were notable for the following: CMP notable for the following: Sodium 132 compared to most recent prior value of 140 on 05/16/2022,  potassium 3.2, carbon 18, anion gap 9 creatinine 1.79 compared to 1.2 02/11/2022, calcium, adjusted for mild hypoalbuminemia noted to be 9.8, intubate 3, alkaline phosphatase 167, AST 105,  ALT 3 5, total Ruben 3.0 with indirect bilirubin was 6.  Initial lactate 3.2 with repeat value trending up to 3.7.  CBC notable for both will count 9600 with 74% neutrophils, globin 9.1 associated normocytic/no current properties and relative to value of 8.3 on 05/03/2022, platelet count 127 compared to 135 on 05/16/2022.  ESR 110.  CRP ordered, result currently pending.  Urinalysis ordered, with result currently pending.  COVID-19/influenza PCR negative.  Blood cultures x2 collected prior to initiation of IV antibiotics.  Imaging and additional notable ED work-up: EKG shows sinus tachycardia with heart rate 101, normal intervals, and no evidence of T wave or ST changes, including no evidence of ST elevation.  Plan films of the left foot showed deep dorsal lateral forefoot soft tissue ulceration without evidence of osteomyelitis and without evidence of subcutaneous gas.  EP discussed patient's case with the on-call podiatrist, Dr. Loel Lofty, Who recommended admission to the hospitalists for further evaluation management of worsening infected left foot wound.  Podiatry formally consulted and has evaluated the patient in the ED this evening, conveying plan to take patient to the OR in the morning for surgical debridement of left foot wound, recommending interval broad-spectrum IV antibiotics proceed general inflammatory markers, an MRI of the left foot.  Consequently, podiatry requested the patient be made n.p.o. after midnight.  While in the ED, the following were administered: Cefepime, IV Flagyl, Flagyl, lactated Ringer's as well as will's followed by continuous LR at 150 cc/h  Subsequently, the patient was admitted for further evaluation management of infected left foot wound, including IV antibiotics and plan for surgical debridement in the morning.  Podiatry, with presentation also notable for multiple laboratory abnormalities, including acute hyponatremia, acute kidney injury, as well as lactic acidosis.      Review of Systems: As per HPI otherwise 10 point review of systems negative.   Past Medical History:  Diagnosis Date   Acid reflux    Anemia    Ulcer     Past Surgical History:  Procedure Laterality Date   ABDOMINAL SURGERY     abdominoplasty   BIOPSY  04/18/2022   Procedure: BIOPSY;  Surgeon: Jackquline Denmark, MD;  Location: [facility] ENDOSCOPY;  Service: Gastroenterology;;   BREAST BIOPSY Right 2013   CHOLECYSTECTOMY N/A 11/23/2019   Procedure: LAPAROSCOPIC CHOLECYSTECTOMY WITH INTRAOPERATIVE CHOLANGIOGRAM;  Surgeon: Mickeal Skinner, MD;  Location: [place];  Service: General;  Laterality: N/A;   COSMETIC SURGERY     tummy tuck   ESOPHAGOGASTRODUODENOSCOPY (EGD) WITH PROPOFOL N/A 04/18/2022   Procedure: ESOPHAGOGASTRODUODENOSCOPY (EGD) WITH PROPOFOL;  Surgeon: Jackquline Denmark, MD;  Location: [place];  Service: Gastroenterology;  Laterality: N/A;   HERNIA REPAIR     ROUX-EN-Y PROCEDURE     TUMOR REMOVAL     WRIST GANGLION EXCISION      Social History:  reports that she has been smoking cigarettes. She has a 6.25 pack-year smoking history. She has been exposed to tobacco smoke. She has never used smokeless tobacco. She reports that she does not currently use alcohol after a past usage of about 12.0 standard drinks of alcohol per week. She reports that she does not use drugs.   Allergies  Allergen Reactions   Tape Rash    Clear tape    Family History  Problem Relation Age of Onset  Cancer Mother        kidney   Cancer Paternal Aunt    Breast cancer Maternal Aunt    Pancreatic cancer Maternal Aunt    Lung cancer Paternal Uncle     Family history reviewed and not pertinent    Prior to Admission medications   Medication Sig Start Date End Date Taking? Authorizing Provider  busPIRone (BUSPAR) 15 MG tablet Take 15 mg by mouth daily as needed (anxiety). 06/09/21   [provider]  chlorthalidone (HYGROTON) 25 MG tablet Take 1 tablet (25 mg total) by mouth  daily. 05/16/22   Kerin Perna, NP  Cyanocobalamin (B-12 PO) Take 1 tablet by mouth daily.    [provider]  ferrous sulfate 325 (65 FE) MG tablet Take 1 tablet (325 mg total) by mouth daily with breakfast. 05/16/22   Kerin Perna, NP  folic acid (FOLVITE) 1 MG tablet Take 1 tablet (1 mg total) by mouth daily. 04/22/22   Danford, Suann Larry, MD  loperamide (IMODIUM) 2 MG capsule Take 1 capsule (2 mg total) by mouth as needed for diarrhea or loose stools. 04/22/22   Danford, Suann Larry, MD  midodrine (PROAMATINE) 10 MG tablet Take 1 tablet (10 mg total) by mouth 3 (three) times daily with meals. 04/29/22   Thurnell Lose, MD  Multiple Vitamin (MULTIVITAMIN) tablet Take 1 tablet by mouth daily.    [provider]  ondansetron (ZOFRAN-ODT) 8 MG disintegrating tablet Take 1 tablet (8 mg total) by mouth every 8 (eight) hours as needed for nausea or vomiting. 05/16/22   Kerin Perna, NP  pantoprazole (PROTONIX) 40 MG tablet Take 1 tablet (40 mg total) by mouth 2 (two) times daily before a meal. 04/22/22   Danford, Suann Larry, MD  prednisoLONE (PRELONE) 15 MG/5ML SOLN Take 39.9 mg by mouth daily. Patient not taking: Reported on 05/30/2022    [provider]  spironolactone (ALDACTONE) 50 MG tablet Take 1 tablet (50 mg total) by mouth daily. 05/16/22   Kerin Perna, NP  sucralfate (CARAFATE) 1 GM/10ML suspension Take 10 mLs (1 g total) by mouth every 6 (six) hours. Patient not taking: Reported on 05/30/2022 04/22/22   Edwin Dada, MD     Objective    Physical Exam: Vitals:   06/20/22 1800 06/20/22 1845 06/20/22 1915 06/20/22 1930  BP: 118/74 95/71 (!) 111/52 108/65  Pulse: 98 96 91 92  Resp: 17 16 (!) 21 18  Temp:      TempSrc:      SpO2: 97% 96% 96% 97%  Weight:      Height:        General: appears to be stated age; alert, oriented Skin: warm, dry; wound associated with the dorsal aspect of the lateral left forefoot  associated with increased swelling, erythema, increased warmth, tenderness, but in the absence of associated drainage. no evidence of associated crepitus. Head:  AT/[place] Mouth:  Oral mucosa membranes appear dry, normal dentition Neck: supple; trachea midline Heart:  RRR; did not appreciate any M/R/G Lungs: CTAB, did not appreciate any wheezes, rales, or rhonchi Abdomen: + BS; soft, ND, NT Vascular: 2+ pedal pulses b/l; 2+ radial pulses b/l Extremities: no muscle wasting; left foot wound as further detailed above. Neuro: strength and sensation intact in upper and lower extremities b/l      Labs on Admission: I have personally reviewed following labs and imaging studies  CBC: Recent Labs  Lab 06/20/22 1436  WBC 9.6  NEUTROABS  7.1  HGB 9.1*  HCT 28.0*  MCV 97.9  PLT 889*   Basic Metabolic Panel: Recent Labs  Lab 06/20/22 1436  NA 132*  K 3.7  CL 105  CO2 18*  GLUCOSE 89  BUN 12  CREATININE 1.79*  CALCIUM 8.5*   GFR: Estimated Creatinine Clearance: 43.4 mL/min (A) (by C-G formula based on SCr of 1.79 mg/dL (H)). Liver Function Tests: Recent Labs  Lab 06/20/22 1436  AST 105*  ALT 34  ALKPHOS 167*  BILITOT 3.0*  PROT 7.6  ALBUMIN 2.3*   No results for input(s): "LIPASE", "AMYLASE" in the last 168 hours. No results for input(s): "AMMONIA" in the last 168 hours. Coagulation Profile: No results for input(s): "INR", "PROTIME" in the last 168 hours. Cardiac Enzymes: No results for input(s): "CKTOTAL", "CKMB", "CKMBINDEX", "TROPONINI" in the last 168 hours. BNP (last 3 results) No results for input(s): "PROBNP" in the last 8760 hours. HbA1C: No results for input(s): "HGBA1C" in the last 72 hours. CBG: No results for input(s): "GLUCAP" in the last 168 hours. Lipid Profile: No results for input(s): "CHOL", "HDL", "LDLCALC", "TRIG", "CHOLHDL", "LDLDIRECT" in the last 72 hours. Thyroid Function Tests: No results for input(s): "TSH", "T4TOTAL", "FREET4", "T3FREE",  "THYROIDAB" in the last 72 hours. Anemia Panel: No results for input(s): "VITAMINB12", "FOLATE", "FERRITIN", "TIBC", "IRON", "RETICCTPCT" in the last 72 hours. Urine analysis:    Component Value Date/Time   COLORURINE YELLOW 04/30/2022 1310   APPEARANCEUR CLEAR 04/30/2022 1310   LABSPEC 1.010 04/30/2022 1310   PHURINE 5.5 04/30/2022 1310   GLUCOSEU NEGATIVE 04/30/2022 1310   HGBUR MODERATE (A) 04/30/2022 1310   BILIRUBINUR NEGATIVE 04/30/2022 1310   KETONESUR NEGATIVE 04/30/2022 1310   PROTEINUR NEGATIVE 04/30/2022 1310   UROBILINOGEN 0.2 01/09/2013 2111   NITRITE NEGATIVE 04/30/2022 1310   LEUKOCYTESUR SMALL (A) 04/30/2022 1310    Radiological Exams on Admission: DG Foot Complete Left  Result Date: 06/20/2022 CLINICAL DATA:  Left foot wound. EXAM: LEFT FOOT - COMPLETE 3+ VIEW COMPARISON:  None Available. FINDINGS: Deep dorsal lateral forefoot soft tissue ulceration with prominent soft tissue swelling. No bony destruction or periosteal reaction. No acute fracture or dislocation. Joint spaces are preserved. Bone mineralization is normal. IMPRESSION: 1. Deep dorsal lateral forefoot soft tissue ulceration without radiographic evidence of osteomyelitis. Electronically Signed   By: Titus Dubin M.D.   On: 06/20/2022 14:36     EKG: Independently reviewed, with result as described above.    Assessment/Plan    Principal Problem:   Open wound of left foot Active Problems:   Alcoholic hepatitis   Chronic iron deficiency anemia   AKI (acute kidney injury) (HCC)   Lactic acidosis   Acute hyponatremia   GERD (gastroesophageal reflux disease)   Essential hypertension   Tobacco abuse    #) infected left foot wound: Known wound on the dorsal aspect of the left forefront since fall in June 2023, with recent progression there, in spite of the compliance and routinely following in wound care clinic, and in spite of recent outpatient antibiotic regimen including at least 3-4 antibiotics  as mentioned above, noting will may have progressive swelling, erythema, tenderness, increased warmth.  Evaluated by podiatry in the ED today, recommending hospitalization for worsening of left foot wound, with podiatry to formally consult to patient to or in the morning for surgical debridement of wound with IV antibiotics.  Of note, the patient has received IV vancomycin, cefepime, and Flagyl in the ED this evening after collection of blood cultures  x2.  We will continue this prescription regimen plain films of the left foot demonstrate no evidence of osteomyelitis nor any evidence of subcutaneous gas.  In tandem with no evidence of crepitus on physical exam, presentation felt to be less consistent with nec fasc.  Left lower extremity appears neurovascularly intact.  Of note, she is not a diabetic, and most recent A1c was found to be 3.8% in July 2023.  In the absence of objective fever or leukocytosis, SIRS criteria not met at this time, and therefore criteria for sepsis blood currently met.  Her elevated lactate is felt to be multifactorial in nature, with multiple noninfectious contributing factors, as further detailed below.  Correspondingly, and in the setting of her underlying liver disease with caution to not volume overload her, will refrain from 30 mL/kg IVF bolus at this time.   Relevant factors considered include the patient's chronic tobacco abuse.    Plan: Podiatry consulted.  N.p.o. at midnight.  Follow-up for blood cultures x2.  Continue IV vancomycin, cefepime, Flagyl.  Follow-up results of CRP.  MRI of the left foot, per podiatry recommendation.  Counseled the patient importance of elimination of cigarette smoking, as further detailed below.  Continue ivf's, as quantified below.  Monitor strict I's and O's to the beach.  Repeat CMP and CBC in the morning.  Check INR.  Repeat lactate.  Prn IV fentanyl.               #) Acute hypo-osmolar  hyponatremia: Presenting serum sodium  132.  Most recent prior value of 140 on 05/16/2022. Suspect an element of hypovolumeia, with suspected contribution from dehydration in the setting of clinical evidence of such as well as patient's report of recent nausea/vomiting.  Overall, the patient appears to be intravascularly depleted, but with increased risk for vitamin given her history of liver dysfunction.  Will provide gentle IV fluids, while further evaluatuing for potential additional underlying contributing factors to her acute hyponatremia, including assessment for SIADH, as below.  Differential also includes potential restriction from chlorthalidone, which will be held for now.  Of note, the patient has a history of chronic alcohol abuse, although reports that she is completely abstained over the last 3 months.    Plan: monitor strict I's and O's and daily weights.  check UA, random urine sodium, urine osmolality.  Check serum osmolality to confirm suspected hypoosmolar etiology.  Repeat CMP in the morning. Check TSH.  Continuous IV fluids.  Hold home chlorthalidone.  Prn IV Zofran for nausea. Add-on etoh.  Add on BNP.  Check INR.            #) Acute Kidney Injury: Presenting serum creatinine 1.79 compared to his recent prior value of 1.25 in August 2023.  Suspect that this.  The nature is consulted for observation from recent increasing GI losses via for 5 days of nausea/vomiting, as above, as well as further pharmacologic exacerbation via home diuretic medications.  Less likely to be hepatorenal, but will monitor closely and seeing renal function in response to interval IV fluids, while waiting results of INR.  Urinalysis with microscopy ordered, with result currently pending.   Plan: monitor strict I's & O's and daily weights. Attempt to avoid nephrotoxic agents. Refrain from NSAIDs.  Holding home chlorthalidone, spironolactone.  Gentle IV fluids, as above.  Repeat CMP in the morning. Check serum magnesium level.  Follow-up  result urinalysis with microscopy.  Add-on random urine sodium and random urine creatinine.  Added CPK level.       #)  Lactic acidosis: Will lactate 3.2, with repeat value trending up to 3.7.  As noted above, criteria are not met for sepsis at this time in the absence of objective fever or leukocytosis.  Rather, there are potentially multiple noninfectious contributing factors leading to this elevated finding, including her underlying liver disease, as well as finding of acute kidney injury, as above, and clinical suspicion for dehydration.  We will also check serum ethanol level in the context of a history of chronic alcohol abuse with reported interval abstinence in the last 3 months.  Additionally, in the setting of elevated lactate and acute kidney injury, will also check CPK level.   Plan: Continue IV fluids repeat lactic acid level.  Monitor strict I's and O's and weights.  Add on ethanol level.  Check CPK.  Check urinalysis.  Repeat CMP/CBC in the morning.  Further evaluation management of presenting infected left foot wound.  Check INR. Uds.           #) Subacute alcoholic hepatitis: Negative except per alcohol use, the patient was hospitalized in August 2023 with acute alcoholic otitis, subsequently completing a 1 month course on prednisolone.  She denies any inciting alcohol consumption.  Her liver enzymes remain elevated, although interval decrease in alkaline phosphatase and ALT is noted, with stable bilirubin, which is found to be indirect predominant consistent with hepatocellular pathology as opposed to an obstructive cholestatic pattern.  Of note, ALT is more than doubled in the interval.  We will check serum ethanol level to further assess.  INR pending for calculation of MELD.  Her liver disease appears complicated by chronic anemia, chronic thrombocytopenia both of which appear at baseline.   Plan: Monitor strict I's and O's and daily weights.  Check INR.  Repeat CMP in the  morning.  Plan serum ethanol level.  Urine drug screen.             #) GERD: documented h/o such; on present Twice daily basis as outpatient.   Plan: In setting of current n.p.o. status, will transiently convert Protonix to IV route of administration.          #) chronic anemia: Documented history of chronic iron deficiency anemia, with potential attrition from underlying hepatic dysfunction, as above, with baseline hemoglobin noted to be 7-9, with presenting hemoglobin consistent with baseline, in the absence of any overt evidence of active bleed.  On daily oral iron supplementation at home.   Plan: Check INR.  Repeat CBC in the morning.  Additionally, given plan for surgical debridement in the morning.  Also order type and screen.  In setting of current n.p.o. status, will hold him daily oral iron supplementation for now.           #) Essential Hypertension: documented h/o such, with outpatient antihypertensive regimen including chlorthalidone, which we held in the setting of clinical evidence of dehydration as well as acute hyponatremia, spironolactone.  Medication list also includes midodrine.  SBP's in the ED today: Low 100s to 120s mmHg.   Plan: Close monitoring of subsequent BP via routine VS. sitting or sitting infectious process, clinical evidence of dehydration as well as acute hyponatremia, will hold him without his will spironolactone for now.          #) Chronic tobacco abuse: Patient conveys that they are a current smoker, having smoked quarter to half pack per day for greater than 25 years.  Notable in the setting of a poorly healing chronic left foot wound.  Plan:  Counseled the patient for less than 2 minutes on the importance of complete smoking discontinuation, particular as relates to implications related to the healing process of her left.  Order placed for prn nicotine patch for use during this hospitalization.      DVT prophylaxis: SCD's    Code Status: Full code Family Communication: none Disposition Plan: Per Rounding Team Consults called: DVT discussed patient's case with on-call podiatrist, Dr. Loel Lofty, Who will formally consult, as further detailed above;  Admission status: Inpatient   PLEASE NOTE THAT DRAGON DICTATION SOFTWARE WAS USED IN THE CONSTRUCTION OF THIS NOTE.   [place] DO [facility] [place]   06/20/2022, 8:11 PM

## 2022-06-20 NOTE — Sepsis Progress Note (Signed)
Notified bedside nurse of need to draw repeat lactic acid. 

## 2022-06-20 NOTE — Sepsis Progress Note (Signed)
Code Sepsis protocol being monitored by e[employer]. 

## 2022-06-20 NOTE — ED Provider Notes (Signed)
[facility] DEPT Provider Note   CSN: [id number] Arrival date & time: 06/20/22  1323     History  Chief Complaint  Patient presents with  . Wound Infection    [name] is a 46 y.o. female.  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  Patient states that she has been managed by wound care team for her chronic wounds.  Although the wound in her sacrum is getting better, the wound in her left foot has gotten worse.  The wound care team advised her to come to the ER for admission and surgical debridement.  Patient states that she is taking 4 different antibiotics -amoxicillin plus a compound antibiotic that contains 3 different antibiotics (Cipro, Flagyl, gentamicin).  She denies any fevers or chills.  Home Medications Prior to Admission medications   Medication Sig Start Date End Date Taking? Authorizing Provider  busPIRone (BUSPAR) 15 MG tablet Take 15 mg by mouth daily as needed (anxiety). 06/09/21   [provider]  chlorthalidone (HYGROTON) 25 MG tablet Take 1 tablet (25 mg total) by mouth daily. 05/16/22   Kerin Perna, NP  Cyanocobalamin (B-12 PO) Take 1 tablet by mouth daily.    [provider]  ferrous sulfate 325 (65 FE) MG tablet Take 1 tablet (325 mg total) by mouth daily with breakfast. 05/16/22   Kerin Perna, NP  folic acid (FOLVITE) 1 MG tablet Take 1 tablet (1 mg total) by mouth daily. 04/22/22   Danford, Suann Larry, MD  loperamide (IMODIUM) 2 MG capsule Take 1 capsule (2 mg total) by mouth as needed for diarrhea or loose stools. 04/22/22   Danford, Suann Larry, MD  midodrine (PROAMATINE) 10 MG tablet Take 1 tablet (10 mg total) by mouth 3 (three) times daily with meals. 04/29/22   Thurnell Lose, MD  Multiple Vitamin (MULTIVITAMIN) tablet Take 1 tablet by mouth daily.    [provider]  ondansetron (ZOFRAN-ODT) 8 MG disintegrating tablet Take 1 tablet (8 mg total) by mouth every 8 (eight) hours as needed for nausea or vomiting. 05/16/22   Kerin Perna, NP  pantoprazole (PROTONIX) 40 MG tablet Take 1 tablet (40 mg total) by mouth 2 (two) times daily before a meal. 04/22/22   Danford, Suann Larry, MD  prednisoLONE (PRELONE) 15 MG/5ML SOLN Take 39.9 mg by mouth daily. Patient not taking: Reported on 05/30/2022    [provider]  spironolactone (ALDACTONE) 50 MG tablet Take 1 tablet (50 mg total) by mouth daily. 05/16/22   Kerin Perna, NP  sucralfate (CARAFATE) 1 GM/10ML suspension Take 10 mLs (1 g total) by mouth every 6 (six) hours. Patient not taking: Reported on 05/30/2022 04/22/22   Edwin Dada, MD      Allergies    Tape    Review of Systems   Review of Systems  All other systems reviewed and are negative.   Physical Exam Updated Vital Signs BP 108/65   Pulse 92   Temp 98 F (36.7 C)   Resp 18   Ht 5\' 6"  (1.676 m)   Wt 82.6 kg   SpO2 97%   BMI 29.38 kg/m  Physical Exam Vitals and nursing note reviewed.  Constitutional:      Appearance: She is well-developed.  HENT:  Head: Atraumatic.  Cardiovascular:     Rate and Rhythm: Normal rate.  Pulmonary:     Effort: Pulmonary effort is normal.  Musculoskeletal:        General: Swelling and tenderness present.     Cervical back: Normal range of motion and neck supple.     Comments: Left foot edema, open ulcer over the dorsal surface - proximal to the toes  Skin:    General: Skin is warm and dry.  Neurological:     Mental Status: She is alert and oriented to person, place, and time.    ED Results / Procedures / Treatments   Labs (all labs ordered are listed, but only abnormal results are displayed) Labs Reviewed  CBC WITH DIFFERENTIAL/PLATELET - Abnormal;  Notable for the following components:      Result Value   RBC 2.86 (*)    Hemoglobin 9.1 (*)    HCT 28.0 (*)    RDW 16.0 (*)    Platelets 127 (*)    All other components within normal limits  BASIC METABOLIC PANEL - Abnormal; Notable for the following components:   Sodium 132 (*)    CO2 18 (*)    Creatinine, Ser 1.79 (*)    Calcium 8.5 (*)    GFR, Estimated 35 (*)    All other components within normal limits  LACTIC ACID, PLASMA - Abnormal; Notable for the following components:   Lactic Acid, Venous 3.2 (*)    All other components within normal limits  LACTIC ACID, PLASMA - Abnormal; Notable for the following components:   Lactic Acid, Venous 3.7 (*)    All other components within normal limits  HEPATIC FUNCTION PANEL - Abnormal; Notable for the following components:   Albumin 2.3 (*)    AST 105 (*)    Alkaline Phosphatase 167 (*)    Total Bilirubin 3.0 (*)    Bilirubin, Direct 1.4 (*)    Indirect Bilirubin 1.6 (*)    All other components within normal limits  RESP PANEL BY RT-PCR (FLU A&B, COVID) ARPGX2  CULTURE, BLOOD (ROUTINE X 2)  CULTURE, BLOOD (ROUTINE X 2)  SEDIMENTATION RATE  C-REACTIVE PROTEIN  URINALYSIS, ROUTINE W REFLEX MICROSCOPIC  PROTIME-INR  APTT  CBC WITH DIFFERENTIAL/PLATELET  COMPREHENSIVE METABOLIC PANEL  MAGNESIUM  MAGNESIUM  I-STAT BETA HCG BLOOD, ED ([facility], [facility], [facility] ONLY)    EKG None  Radiology DG Foot Complete Left  Result Date: 06/20/2022 CLINICAL DATA:  Left foot wound. EXAM: LEFT FOOT - COMPLETE 3+ VIEW COMPARISON:  None Available. FINDINGS: Deep dorsal lateral forefoot soft tissue ulceration with prominent soft tissue swelling. No bony destruction or periosteal reaction. No acute fracture or dislocation. Joint spaces are preserved. Bone mineralization is normal. IMPRESSION: 1. Deep dorsal lateral forefoot soft tissue ulceration without radiographic evidence of osteomyelitis. Electronically Signed   By: Titus Dubin M.D.   On: 06/20/2022  14:36    Procedures .Critical Care  Performed by: Varney Biles, MD Authorized by: Varney Biles, MD   Critical care provider statement:    Critical care time (minutes):  42   Critical care was necessary to treat or prevent imminent or life-threatening deterioration of the following conditions:  Sepsis   Critical care was time spent personally by me on the following activities:  Development of treatment plan with patient or surrogate, discussions with consultants, evaluation of patient's response to treatment, examination of patient, ordering and review of laboratory studies, ordering and review of radiographic studies, ordering and performing treatments  and interventions, pulse oximetry, re-evaluation of patient's condition and review of old charts     Medications Ordered in ED Medications  lactated ringers infusion ( Intravenous New Bag/Given 06/20/22 1934)  metroNIDAZOLE (FLAGYL) IVPB 500 mg (500 mg Intravenous New Bag/Given 06/20/22 1939)  vancomycin (VANCOREADY) IVPB 1500 mg/300 mL (1,500 mg Intravenous New Bag/Given 06/20/22 2006)  lactated ringers bolus 1,000 mL (has no administration in time range)  influenza vac split quadrivalent PF (FLUARIX) injection 0.5 mL (has no administration in time range)  pneumococcal 20-valent conjugate vaccine (PREVNAR 20) injection 0.5 mL (has no administration in time range)  acetaminophen (TYLENOL) tablet 650 mg (has no administration in time range)    Or  acetaminophen (TYLENOL) suppository 650 mg (has no administration in time range)  naloxone (NARCAN) injection 0.4 mg (has no administration in time range)  fentaNYL (SUBLIMAZE) injection 25 mcg (has no administration in time range)  ondansetron (ZOFRAN) injection 4 mg (has no administration in time range)  metroNIDAZOLE (FLAGYL) IVPB 500 mg (has no administration in time range)  ceFEPIme (MAXIPIME) 2 g in sodium chloride 0.9 % 100 mL IVPB (2 g Intravenous New Bag/Given 06/20/22 1902)    ED  Course/ Medical Decision Making/ A&P Clinical Course as of 06/20/22 2017  Tue Jun 20, 2022  2016 Lactic Acid, Venous(!!): 3.7 Lactic acid is rising. Bolus of fluid ordered.  We will need to get more lactic acid, as it is rising. [AN]    Clinical Course User Index [AN] Varney Biles, MD                           Medical Decision Making Amount and/or Complexity of Data Reviewed Labs: ordered. ECG/medicine tests: ordered.  Risk Prescription drug management. Decision regarding hospitalization.   This patient presents to the ED with chief complaint(s) of wound to the foot with pertinent past medical history of liver cirrhosis, chronic pressure ulcers and foot ulcer - for which she is on antibiotics .The complaint involves an extensive differential diagnosis and also carries with it a high risk of complications and morbidity.    The differential diagnosis includes osteomyelitis, necrotic wound, cellulitis, abscess.  The initial plan is to initial sepsis workup, give broad spectrum antibiotics, get x-ray of the foot and consult podiatry.   Additional history obtained: Records reviewed  records from wound care clinic from yesterday.  It appears that patient had some debridement done.  I have also reviewed patient's outpatient antibiotics.  Independent labs interpretation:  The following labs were independently interpreted: Elevated lactate of 3.2. Patient has elevated LFTs, elevated bilirubin.  Elevated bilirubin is likely because of her liver disease.  Mild hyponatremia, chronic anemia also noted.  Independent visualization and interpretation of imaging: - I independently visualized the following imaging with scope of interpretation limited to determining acute life threatening conditions related to emergency care: X-ray of the foot, which revealed no evidence of free air  Treatment and Reassessment: Results discussed with the patient.  Consultation made to podiatry service will see  the patient.  Consultation: - Consulted or discussed management/test interpretation with external professional: Podiatry team, they will take the patient for further debridement of the wound.    Final Clinical Impression(s) / ED Diagnoses Final diagnoses:  Wound infection  Ulcer of left foot with muscle involvement without evidence of necrosis ([place])    Rx / DC Orders ED Discharge Orders     None  Varney Biles, MD 06/20/22 2018

## 2022-06-20 NOTE — Progress Notes (Signed)
Pharmacy Antibiotic Note  [name] is a 46 y.o. female admitted on 06/20/2022 with infected left foot wound.  Pharmacy has been consulted for vancomycin, cefepime dosing.  Plan: Metronidazole per MD Cefepime 2g IV q12h  Vancomycin 1500 mg IV q36h (SCr 1.79, est AUC 473) Measure Vanc levels as needed.  Goal AUC = 400 - 550  Follow up renal function, culture results, and clinical course.   Height: 5\' 6"  (167.6 cm) Weight: 82.6 kg (182 lb) IBW/kg (Calculated) : 59.3  Temp (24hrs), Avg:98.2 F (36.8 C), Min:98 F (36.7 C), Max:98.7 F (37.1 C)  Recent Labs  Lab 06/20/22 1400 06/20/22 1436 06/20/22 1600  WBC  --  9.6  --   CREATININE  --  1.79*  --   LATICACIDVEN 3.2*  --  3.7*    Estimated Creatinine Clearance: 43.4 mL/min (A) (by C-G formula based on SCr of 1.79 mg/dL (H)).    Allergies  Allergen Reactions   Tape Rash    Clear tape    Antimicrobials this admission: 9/26 cefepime >>  9/26 metronidazole >>  9/26 vancomycin >>   Dose adjustments this admission:   Microbiology results: 9/26 BCx:   Thank you for allowing pharmacy to be a part of this patient's care.  Gretta Arab PharmD, BCPS Clinical Pharmacist [facility] main pharmacy [phone] 06/20/2022 8:22 PM

## 2022-06-20 NOTE — ED Provider Triage Note (Signed)
Emergency Medicine Provider Triage Evaluation Note  [name] , a 46 y.o. female  was evaluated in triage.  Pt complains of left foot infection.  Patient followed by wound care and sent to the ED for debridement.  Patient is currently on 4 antibiotics.  No fever or chills.  Review of Systems  Positive: wound Negative: fever  Physical Exam  BP 114/72 (BP Location: Left Arm)   Pulse (!) 115   Temp 98 F (36.7 C) (Oral)   Resp 18   Ht 5\' 6"  (1.676 m)   Wt 82.6 kg   SpO2 99%   BMI 29.38 kg/m  Gen:   Awake, no distress   Resp:  Normal effort  MSK:   Moves extremities without difficulty  Other:    Medical Decision Making  Medically screening exam initiated at 2:03 PM.  Appropriate orders placed.  [name] was informed that the remainder of the evaluation will be completed by another provider, this initial triage assessment does not replace that evaluation, and the importance of remaining in the ED until their evaluation is complete.  [place], [place], [place] 06/20/22 1404

## 2022-06-20 NOTE — ED Triage Notes (Signed)
Pt referred to ED from [facility] for possible debridement of chronic wound to L foot. Pt is currently on 4 abx regimen for same.

## 2022-06-20 NOTE — Progress Notes (Signed)
A consult was received from an ED physician for vancomycin and cefepime per pharmacy dosing.  The patient's profile has been reviewed for ht/wt/allergies/indication/available labs.    A one time order has been placed for cefepime 2 g IV once + vancomycin 1500 mg IV once.    Further antibiotics/pharmacy consults should be ordered by admitting physician if indicated.                       Thank you, Lenis Noon, PharmD 06/20/2022  6:07 PM

## 2022-06-20 NOTE — Sepsis Progress Note (Signed)
Notified bedside nurse of need to administer antibiotics and fluid bolus.  

## 2022-06-20 NOTE — Telephone Encounter (Signed)
Per Sharyn Lull she doesn't have any documentation in regards to form. Contacted pt and made aware pt states that is fine and she would like for [place] to hold form. Pt states she is currently at the ED

## 2022-06-20 NOTE — Consult Note (Addendum)
PODIATRY CONSULTATION  NAME [name] MRN [id number] DOB 1976-02-19 DOA 06/20/2022   Reason for consult:  Chief Complaint  Patient presents with   Wound Infection    Attending/Consulting physician: Varney Biles MD  History of present illness: 46 y.o. female with history GERD, Anemia, and chronic dorsal left foot wound. Denies history of DM. Sent from Wound care center for surgical debridement for concern for worsening wound infection per patient. She has swelling redness and yellow colored drainage from the wound. She has been taking augmentin for left foot infection. She has severe pain to the area with touch. Denies N/V/F/C.   Past Medical History:  Diagnosis Date   Acid reflux    Anemia    Ulcer        Latest Ref Rng & Units 06/20/2022    2:36 PM 05/16/2022    4:33 PM 05/03/2022    7:09 AM  CBC  WBC 4.0 - 10.5 K/uL 9.6  13.5  13.0   Hemoglobin 12.0 - 15.0 g/dL 9.1  10.0  8.3   Hematocrit 36.0 - 46.0 % 28.0  27.7  25.3   Platelets 150 - 400 K/uL 127  135  116        Latest Ref Rng & Units 06/20/2022    2:36 PM 05/16/2022    4:33 PM 05/03/2022    7:09 AM  BMP  Glucose 70 - 99 mg/dL 89  76  122   BUN 6 - 20 mg/dL '12  16  25   ' Creatinine 0.44 - 1.00 mg/dL 1.79  1.25  1.87   BUN/Creat Ratio 9 - 23  13    Sodium 135 - 145 mmol/L 132  140  137   Potassium 3.5 - 5.1 mmol/L 3.7  4.0  4.2   Chloride 98 - 111 mmol/L 105  102  103   CO2 22 - 32 mmol/L '18  21  23   ' Calcium 8.9 - 10.3 mg/dL 8.5  9.8  9.9     Afebrile, Tachycardia to 115 now improved to 91, RR 21   Physical Exam: Lower Extremity Exam Vasc: R - PT palpable, DP palpable. Cap refill < 3 sec to digits  L - PT palpable, DP palpable. Cap refill <3 sec to digits  Derm: R - Normal temp/texture/turgor with no open lesion or clinical signs of infection   L - Wound: Located dorsal left midfoot, measuring approximately 3x2 cm in ciruclar area x 2 with likely sinus tract connecting these wounds, probe to bone  no, odor minimal, drainage yes seropurulent. Wound base with necrotic and fibrotic tissue present.      MSK:  R -  No gross deformities. Compartments soft, non-tender, compressible  L -  2+ pitting edema right foot, severely tender to palpation around the dorsum of the foot and wounds  Neuro: R - Gross sensation intact. Gross motor function intact   L - Gross sensation intact. Gross motor function intact    ASSESSMENT/PLAN OF CARE 46 y.o. female with PMHx significant for GERD, Anemia, chronic foot wound and sacral abscess with Sepsis 2/2 L foot wound infection that has recently worsened. Concern for abscess dorsal left forefoot.  - NPO past MN tonight for OR tomorrow 9/27 for Left foot Incision and drainage/ Debridement of wound, possible graft and/or wound vac application - MRI of the left foot ordered for further assessment of extent of infection/abscess  - ESR and CRP pending - Continue IV abx broad spectrum pending further  culture data - Anticoagulation: Hold pending OR tomorrow - Wound care: Apply betadine dressing to wound left foot - WB status: WBAT to the left foot pending OR - Will continue to follow   Thank you for the consult.  Please contact me directly with any questions or concerns.           Everitt Amber, DPM [facility]    [address], [place] [address]                Office [phone]  Fax [phone]

## 2022-06-21 ENCOUNTER — Inpatient Hospital Stay (HOSPITAL_COMMUNITY): Payer: Self-pay | Admitting: Anesthesiology

## 2022-06-21 ENCOUNTER — Encounter (HOSPITAL_COMMUNITY)
Admission: EM | Disposition: A | Discharge status: Home / Self Care | Payer: Self-pay | Source: Home / Self Care | Attending: Student

## 2022-06-21 ENCOUNTER — Telehealth (INDEPENDENT_AMBULATORY_CARE_PROVIDER_SITE_OTHER): Payer: Self-pay | Admitting: Primary Care

## 2022-06-21 ENCOUNTER — Encounter (HOSPITAL_COMMUNITY): Payer: Self-pay | Admitting: Internal Medicine

## 2022-06-21 ENCOUNTER — Other Ambulatory Visit: Payer: Self-pay

## 2022-06-21 DIAGNOSIS — L97525 Non-pressure chronic ulcer of other part of left foot with muscle involvement without evidence of necrosis: Secondary | ICD-10-CM

## 2022-06-21 DIAGNOSIS — J189 Pneumonia, unspecified organism: Secondary | ICD-10-CM

## 2022-06-21 DIAGNOSIS — F17210 Nicotine dependence, cigarettes, uncomplicated: Secondary | ICD-10-CM

## 2022-06-21 DIAGNOSIS — I10 Essential (primary) hypertension: Secondary | ICD-10-CM

## 2022-06-21 DIAGNOSIS — L97521 Non-pressure chronic ulcer of other part of left foot limited to breakdown of skin: Secondary | ICD-10-CM

## 2022-06-21 DIAGNOSIS — I959 Hypotension, unspecified: Secondary | ICD-10-CM

## 2022-06-21 HISTORY — PX: I & D EXTREMITY: SHX5045

## 2022-06-21 LAB — URINALYSIS, ROUTINE W REFLEX MICROSCOPIC
Bilirubin Urine: NEGATIVE
Glucose, UA: NEGATIVE mg/dL
Ketones, ur: NEGATIVE mg/dL
Leukocytes,Ua: NEGATIVE
Nitrite: NEGATIVE
Protein, ur: NEGATIVE mg/dL
Specific Gravity, Urine: 1.01 (ref 1.005–1.030)
pH: 6 (ref 5.0–8.0)

## 2022-06-21 LAB — HEMOGLOBIN AND HEMATOCRIT, BLOOD
HCT: 24.4 % — ABNORMAL LOW (ref 36.0–46.0)
Hemoglobin: 8 g/dL — ABNORMAL LOW (ref 12.0–15.0)

## 2022-06-21 LAB — COMPREHENSIVE METABOLIC PANEL
ALT: 26 U/L (ref 0–44)
AST: 73 U/L — ABNORMAL HIGH (ref 15–41)
Albumin: 1.9 g/dL — ABNORMAL LOW (ref 3.5–5.0)
Alkaline Phosphatase: 137 U/L — ABNORMAL HIGH (ref 38–126)
Anion gap: 7 (ref 5–15)
BUN: 11 mg/dL (ref 6–20)
CO2: 19 mmol/L — ABNORMAL LOW (ref 22–32)
Calcium: 8.2 mg/dL — ABNORMAL LOW (ref 8.9–10.3)
Chloride: 110 mmol/L (ref 98–111)
Creatinine, Ser: 1.65 mg/dL — ABNORMAL HIGH (ref 0.44–1.00)
GFR, Estimated: 39 mL/min — ABNORMAL LOW (ref >60)
Glucose, Bld: 75 mg/dL (ref 70–99)
Potassium: 4.1 mmol/L (ref 3.5–5.1)
Sodium: 136 mmol/L (ref 135–145)
Total Bilirubin: 2.1 mg/dL — ABNORMAL HIGH (ref 0.3–1.2)
Total Protein: 6 g/dL — ABNORMAL LOW (ref 6.5–8.1)

## 2022-06-21 LAB — CBC WITH DIFFERENTIAL/PLATELET
Abs Immature Granulocytes: 0.03 10*3/uL (ref 0.00–0.07)
Basophils Absolute: 0.1 10*3/uL (ref 0.0–0.1)
Basophils Relative: 1 %
Eosinophils Absolute: 0.1 10*3/uL (ref 0.0–0.5)
Eosinophils Relative: 2 %
HCT: 21 % — ABNORMAL LOW (ref 36.0–46.0)
Hemoglobin: 6.9 g/dL — CL (ref 12.0–15.0)
Immature Granulocytes: 0 %
Lymphocytes Relative: 23 %
Lymphs Abs: 1.6 10*3/uL (ref 0.7–4.0)
MCH: 33 pg (ref 26.0–34.0)
MCHC: 32.9 g/dL (ref 30.0–36.0)
MCV: 100.5 fL — ABNORMAL HIGH (ref 80.0–100.0)
Monocytes Absolute: 0.6 10*3/uL (ref 0.1–1.0)
Monocytes Relative: 8 %
Neutro Abs: 4.5 10*3/uL (ref 1.7–7.7)
Neutrophils Relative %: 66 %
Platelets: 81 10*3/uL — ABNORMAL LOW (ref 150–400)
RBC: 2.09 MIL/uL — ABNORMAL LOW (ref 3.87–5.11)
RDW: 16.1 % — ABNORMAL HIGH (ref 11.5–15.5)
WBC: 6.9 10*3/uL (ref 4.0–10.5)
nRBC: 0 % (ref 0.0–0.2)

## 2022-06-21 LAB — OSMOLALITY, URINE: Osmolality, Ur: 281 mOsm/kg — ABNORMAL LOW (ref 300–900)

## 2022-06-21 LAB — RAPID URINE DRUG SCREEN, HOSP PERFORMED
Amphetamines: NOT DETECTED
Barbiturates: NOT DETECTED
Benzodiazepines: POSITIVE — AB
Cocaine: NOT DETECTED
Opiates: NOT DETECTED
Tetrahydrocannabinol: NOT DETECTED

## 2022-06-21 LAB — OSMOLALITY: Osmolality: 282 mOsm/kg (ref 275–295)

## 2022-06-21 LAB — PREPARE RBC (CROSSMATCH)

## 2022-06-21 LAB — CREATININE, URINE, RANDOM: Creatinine, Urine: 50 mg/dL

## 2022-06-21 LAB — MRSA NEXT GEN BY PCR, NASAL: MRSA by PCR Next Gen: NOT DETECTED

## 2022-06-21 LAB — SODIUM, URINE, RANDOM: Sodium, Ur: 91 mmol/L

## 2022-06-21 LAB — MAGNESIUM: Magnesium: 1.3 mg/dL — ABNORMAL LOW (ref 1.7–2.4)

## 2022-06-21 LAB — C-REACTIVE PROTEIN: CRP: 1.5 mg/dL — ABNORMAL HIGH (ref <1.0)

## 2022-06-21 SURGERY — IRRIGATION AND DEBRIDEMENT EXTREMITY
Anesthesia: General | Laterality: Left

## 2022-06-21 SURGERY — IRRIGATION AND DEBRIDEMENT WOUND
Anesthesia: General | Laterality: Left

## 2022-06-21 MED ORDER — MIDAZOLAM HCL 2 MG/2ML IJ SOLN
INTRAMUSCULAR | Status: AC
  Filled 2022-06-21: qty 2

## 2022-06-21 MED ORDER — VANCOMYCIN HCL 1500 MG/300ML IV SOLN
1500.0000 mg | INTRAVENOUS | Status: DC
  Administered 2022-06-21 – 2022-06-22 (×2): 1500 mg via INTRAVENOUS
  Filled 2022-06-21 (×2): qty 300

## 2022-06-21 MED ORDER — CHLORHEXIDINE GLUCONATE 0.12 % MT SOLN
15.0000 mL | Freq: Once | OROMUCOSAL | Status: AC
  Administered 2022-06-21: 15 mL via OROMUCOSAL

## 2022-06-21 MED ORDER — ALBUMIN HUMAN 5 % IV SOLN: INTRAVENOUS | Status: DC | PRN

## 2022-06-21 MED ORDER — LACTATED RINGERS IV SOLN: INTRAVENOUS | Status: DC

## 2022-06-21 MED ORDER — BUPIVACAINE HCL (PF) 0.5 % IJ SOLN
INTRAMUSCULAR | Status: DC | PRN
  Administered 2022-06-21: 10 mL

## 2022-06-21 MED ORDER — PHENYLEPHRINE 80 MCG/ML (10ML) SYRINGE FOR IV PUSH (FOR BLOOD PRESSURE SUPPORT)
PREFILLED_SYRINGE | INTRAVENOUS | Status: DC | PRN
  Administered 2022-06-21: 120 ug via INTRAVENOUS
  Administered 2022-06-21: 160 ug via INTRAVENOUS

## 2022-06-21 MED ORDER — MIDAZOLAM HCL 5 MG/5ML IJ SOLN
INTRAMUSCULAR | Status: DC | PRN
  Administered 2022-06-21: 2 mg via INTRAVENOUS

## 2022-06-21 MED ORDER — 0.9 % SODIUM CHLORIDE (POUR BTL) OPTIME
TOPICAL | Status: DC | PRN
  Administered 2022-06-21: 1000 mL

## 2022-06-21 MED ORDER — DEXMEDETOMIDINE HCL IN NACL 80 MCG/20ML IV SOLN
INTRAVENOUS | Status: DC | PRN
  Administered 2022-06-21: 16 ug via BUCCAL

## 2022-06-21 MED ORDER — SODIUM CHLORIDE 0.9 % IR SOLN
Status: DC | PRN
  Administered 2022-06-21: 3000 mL

## 2022-06-21 MED ORDER — GABAPENTIN 300 MG PO CAPS
300.0000 mg | ORAL_CAPSULE | Freq: Three times a day (TID) | ORAL | Status: DC
  Administered 2022-06-21 – 2022-06-23 (×5): 300 mg via ORAL
  Filled 2022-06-21 (×5): qty 1

## 2022-06-21 MED ORDER — PHENYLEPHRINE HCL-NACL 20-0.9 MG/250ML-% IV SOLN
INTRAVENOUS | Status: DC | PRN
  Administered 2022-06-21: 40 ug/min via INTRAVENOUS

## 2022-06-21 MED ORDER — SODIUM CHLORIDE 0.9% IV SOLUTION: Freq: Once | INTRAVENOUS | Status: DC

## 2022-06-21 MED ORDER — BUPIVACAINE HCL (PF) 0.5 % IJ SOLN
INTRAMUSCULAR | Status: AC
  Filled 2022-06-21: qty 30

## 2022-06-21 MED ORDER — HYDROMORPHONE HCL 1 MG/ML IJ SOLN
0.2500 mg | INTRAMUSCULAR | Status: DC | PRN
  Administered 2022-06-21 (×2): 0.5 mg via INTRAVENOUS

## 2022-06-21 MED ORDER — LIDOCAINE 2% (20 MG/ML) 5 ML SYRINGE
INTRAMUSCULAR | Status: DC | PRN
  Administered 2022-06-21: 80 mg via INTRAVENOUS

## 2022-06-21 MED ORDER — HYDROMORPHONE HCL 1 MG/ML IJ SOLN
INTRAMUSCULAR | Status: AC
  Filled 2022-06-21: qty 1

## 2022-06-21 MED ORDER — PROPOFOL 10 MG/ML IV BOLUS
INTRAVENOUS | Status: DC | PRN
  Administered 2022-06-21: 50 mg via INTRAVENOUS
  Administered 2022-06-21: 150 mg via INTRAVENOUS

## 2022-06-21 MED ORDER — MAGNESIUM SULFATE 2 GM/50ML IV SOLN
2.0000 g | Freq: Once | INTRAVENOUS | Status: AC
  Administered 2022-06-21: 2 g via INTRAVENOUS
  Filled 2022-06-21: qty 50

## 2022-06-21 SURGICAL SUPPLY — 61 items
BAG COUNTER SPONGE SURGICOUNT (BAG) ×1 IMPLANT
BAG SPNG CNTER NS LX DISP (BAG) ×1
BLADE SURG 15 STRL LF DISP TIS (BLADE) ×1 IMPLANT
BLADE SURG 15 STRL SS (BLADE) ×1
BNDG ELASTIC 3X5.8 VLCR STR LF (GAUZE/BANDAGES/DRESSINGS) ×1 IMPLANT
BNDG ELASTIC 4X5.8 VLCR STR LF (GAUZE/BANDAGES/DRESSINGS) ×1 IMPLANT
BNDG ESMARK 4X9 LF (GAUZE/BANDAGES/DRESSINGS) IMPLANT
BNDG GAUZE DERMACEA FLUFF 4 (GAUZE/BANDAGES/DRESSINGS) ×1 IMPLANT
BNDG GZE DERMACEA 4 6PLY (GAUZE/BANDAGES/DRESSINGS) ×1
CHLORAPREP W/TINT 26 (MISCELLANEOUS) IMPLANT
CNTNR URN SCR LID CUP LEK RST (MISCELLANEOUS) IMPLANT
CONT SPEC 4OZ STRL OR WHT (MISCELLANEOUS)
COVER BACK TABLE 60X90IN (DRAPES) ×1 IMPLANT
CUFF TOURN SGL QUICK 18X4 (TOURNIQUET CUFF) IMPLANT
CUFF TOURN SGL QUICK 24 (TOURNIQUET CUFF)
CUFF TRNQT CYL 24X4X16.5-23 (TOURNIQUET CUFF) IMPLANT
DRAPE 3/4 80X56 (DRAPES) ×1 IMPLANT
DRAPE EXTREMITY T 121X128X90 (DISPOSABLE) ×1 IMPLANT
DRAPE SHEET LG 3/4 BI-LAMINATE (DRAPES) ×1 IMPLANT
DRAPE U-SHAPE 47X51 STRL (DRAPES) ×1 IMPLANT
DRSG EMULSION OIL 3X3 NADH (GAUZE/BANDAGES/DRESSINGS) IMPLANT
ELECT PENCIL ROCKER SW 15FT (MISCELLANEOUS) IMPLANT
ELECT REM PT RETURN 15FT ADLT (MISCELLANEOUS) ×1 IMPLANT
GAUZE PAD ABD 8X10 STRL (GAUZE/BANDAGES/DRESSINGS) IMPLANT
GAUZE SPONGE 4X4 12PLY STRL (GAUZE/BANDAGES/DRESSINGS) ×1 IMPLANT
GAUZE XEROFORM 1X8 LF (GAUZE/BANDAGES/DRESSINGS) IMPLANT
GLOVE BIO SURGEON STRL SZ7.5 (GLOVE) ×1 IMPLANT
GLOVE BIOGEL PI IND STRL 8 (GLOVE) ×1 IMPLANT
GOWN STRL REUS W/ TWL XL LVL3 (GOWN DISPOSABLE) ×1 IMPLANT
GOWN STRL REUS W/TWL XL LVL3 (GOWN DISPOSABLE) ×1
HANDPIECE INTERPULSE COAX TIP (DISPOSABLE) ×1
KIT BASIN OR (CUSTOM PROCEDURE TRAY) ×1 IMPLANT
KIT TURNOVER KIT A (KITS) IMPLANT
MANIFOLD NEPTUNE II (INSTRUMENTS) ×1 IMPLANT
MATRIX PURAPLYAM COM 3X4 12 SQ (Tissue) IMPLANT
NDL HYPO 25X1 1.5 SAFETY (NEEDLE) ×1 IMPLANT
NEEDLE HYPO 25X1 1.5 SAFETY (NEEDLE) ×1 IMPLANT
NS IRRIG 1000ML POUR BTL (IV SOLUTION) IMPLANT
PADDING CAST ABS COTTON 4X4 ST (CAST SUPPLIES) ×1 IMPLANT
PURAPLYAM COM 3X4 12 SQ (Tissue) ×1 IMPLANT
SET HNDPC FAN SPRY TIP SCT (DISPOSABLE) IMPLANT
SET IRRIG Y TYPE TUR BLADDER L (SET/KITS/TRAYS/PACK) IMPLANT
SOL PREP POV-IOD 4OZ 10% (MISCELLANEOUS) IMPLANT
SPONGE T-LAP 4X18 ~~LOC~~+RFID (SPONGE) ×1 IMPLANT
STAPLER VISISTAT 35W (STAPLE) IMPLANT
STOCKINETTE 6  STRL (DRAPES) ×1
STOCKINETTE 6 STRL (DRAPES) ×1 IMPLANT
SUCTION FRAZIER HANDLE 10FR (MISCELLANEOUS) ×1
SUCTION TUBE FRAZIER 10FR DISP (MISCELLANEOUS) IMPLANT
SUT ETHILON 3 0 PS 1 (SUTURE) IMPLANT
SUT ETHILON 4 0 PS 2 18 (SUTURE) IMPLANT
SUT MNCRL AB 3-0 PS2 18 (SUTURE) IMPLANT
SUT MNCRL AB 4-0 PS2 18 (SUTURE) IMPLANT
SUT PROLENE 4 0 PS 2 18 (SUTURE) IMPLANT
SUT VIC AB 2-0 SH 27 (SUTURE)
SUT VIC AB 2-0 SH 27XBRD (SUTURE) IMPLANT
SYR BULB EAR ULCER 3OZ GRN STR (SYRINGE) ×1 IMPLANT
SYR CONTROL 10ML LL (SYRINGE) ×1 IMPLANT
TUBE IRRIGATION SET MISONIX (TUBING) IMPLANT
UNDERPAD 30X36 HEAVY ABSORB (UNDERPADS AND DIAPERS) ×1 IMPLANT
YANKAUER SUCT BULB TIP NO VENT (SUCTIONS) ×1 IMPLANT

## 2022-06-21 NOTE — Progress Notes (Signed)
PROGRESS NOTE  [name] CCQ:[id number] DOB: 1976-04-20   PCP: Kerin Perna, NP  Patient is from: Home.  DOA: 06/20/2022 LOS: 1  Chief complaints Chief Complaint  Patient presents with   Wound Infection     Brief Narrative / Interim history: 46 year old F with PMH of chronic left foot wound followed at wound clinic, alcoholic hepatitis, GERD, IDA, tobacco use disorder presenting with worsening left foot wound despite different oral antibiotics and follow-up at wound care clinic, and admitted for infected left foot wound, AKI, hyponatremia and lactic acidosis.  Left foot x-ray with deep dorsal lateral forefoot soft tissue ulceration without radiographic evidence of osteomyelitis.  ESR elevated to 110.  CRP 1.5.  Patient was started on broad-spectrum antibiotics and IV fluid and admitted.  Podiatry consulted.  Patient underwent I&D debridement of dorsal foot ulcers and application of skin substitute graft on 9/27.  Tissue culture obtained and sent.  Reportedly no abscess per podiatry.  Subjective: Seen and examined earlier this morning before she went for surgery.  She was anxious and overwhelmed.  She started blood transfusion due to pain in her left arm.  No other complaints.  Objective: Vitals:   06/21/22 1600 06/21/22 1615 06/21/22 1630 06/21/22 1651  BP: (!) 91/55 (!) 89/53 (!) 97/56 98/64  Pulse: 85 89 87 85  Resp: '17 18 17 16  ' Temp:    98.1 F (36.7 C)  TempSrc:      SpO2: 95% 92% 92% 98%  Weight:      Height:        Examination:  GENERAL: No apparent distress.  Nontoxic. HEENT: MMM.  Vision and hearing grossly intact.  NECK: Supple.  No apparent JVD.  RESP:  No IWOB.  Fair aeration bilaterally. CVS:  RRR. Heart sounds normal.  ABD/GI/GU: BS+. Abd soft, NTND.  MSK/EXT:  Moves extremities. No apparent deformity. No edema.  SKIN: Ulceration over dorsal and lateral aspect of left foot as in picture below NEURO: Awake, alert and oriented appropriately.   No apparent focal neuro deficit. PSYCH: Somewhat anxious and tearful.       Procedures:  9/27-I&D debridement of left foot ulcers  Microbiology summarized: 9/26-blood cultures NGTD 9/26-MRSA PCR screen negative  Assessment and plan: Principal Problem:   Open wound of left foot Active Problems:   Alcoholic hepatitis   Chronic iron deficiency anemia   AKI (acute kidney injury) ([place])   Lactic acidosis   Acute hyponatremia   GERD (gastroesophageal reflux disease)   Essential hypertension   Tobacco abuse   Infected left foot ulcers: Failed outpatient treatment with multiple oral antibiotics and wound care at wound clinic.  Significantly elevated ESR with mild CRP elevation but no leukocytosis or fever.  Blood cultures NGTD.  X-ray does not suggest osteomyelitis. -S/p I&D debridement with graft placement by podiatry on 9/24. -Continue broad-spectrum antibiotics pending deep tissue cultures -Pain control and VTE prophylaxis -We will obtain ID consult after culture result  AKI: Likely prerenal from nausea and vomiting.  She is also on hydrocortisone and Aldactone.  Improved. Recent Labs    04/26/22 0459 04/27/22 0450 04/28/22 0413 04/29/22 0306 05/01/22 1464 05/02/22 0548 05/03/22 0709 05/16/22 1633 06/20/22 1436 06/21/22 0439  BUN 21* 26* 22* 21* 22* 27* 25* '16 12 11  ' CREATININE 1.50* 1.69* 1.69* 1.63* 1.82* 1.73* 1.87* 1.25* 1.79* 1.65*  -Continue IV fluid -Continue monitoring -Continue holding diuretics  Hyponatremia: Likely hypovolemic but she is also on Bactrim, Aldactone and [place].  Resolved.  Iron  deficiency anemia: Drop in Hgb likely dilutional from IV fluid.  No evidence of bleeding.  Infused 1 unit but did not complete.  Recent Labs    04/29/22 0306 04/29/22 2346 04/30/22 0545 05/01/22 0643 05/02/22 0548 05/03/22 0709 05/16/22 1633 06/20/22 1436 06/21/22 0439 06/21/22 1234  HGB 8.3* 9.1* 8.6* 9.2* 7.9* 8.3* 10.0* 9.1* 6.9* 8.0*  -Monitor  H&H -Check anemia panel  Lactic acidosis: Likely due to dehydration. -Recheck in the morning  Tobacco use disorder -Encourage smoking cessation -Nicotine patch  EtOH hepatitis/transaminitis/hyperbilirubinemia: Mild elevated AST and bilirubin.  CK within normal.  Could be drinking.  Previously treated with steroid. -Monitor LFT -Monitor for withdrawal symptoms  History of hypertension -Hold diuretics. -Continue midodrine  Hypomagnesemia: -IV magnesium sulfate 2 g x 1   Body mass index is 29.39 kg/m.          DVT prophylaxis:  SCDs Start: 06/20/22 2005  Code Status: Full code Family Communication: None at bedside Level of care: Telemetry Status is: Inpatient Remains inpatient appropriate because: Left foot infection   Final disposition: Likely home once medically stable Consultants:  Podiatry  Sch Meds:  Scheduled Meds:  sodium chloride   Intravenous Once   gabapentin  300 mg Oral TID   HYDROmorphone       influenza vac split quadrivalent PF  0.5 mL Intramuscular Tomorrow-1000   midodrine  10 mg Oral TID WC   pantoprazole (PROTONIX) IV  40 mg Intravenous Q12H   pneumococcal 20-valent conjugate vaccine  0.5 mL Intramuscular Tomorrow-1000   Continuous Infusions:  ceFEPime (MAXIPIME) IV 2 g (06/21/22 0807)   lactated ringers 999 mL/hr at 06/20/22 2120   metronidazole     vancomycin 1,500 mg (06/21/22 0846)   PRN Meds:.acetaminophen **OR** acetaminophen, fentaNYL (SUBLIMAZE) injection, HYDROmorphone, naLOXone (NARCAN)  injection, nicotine, ondansetron (ZOFRAN) IV  Antimicrobials: Anti-infectives (From admission, onward)    Start     Dose/Rate Route Frequency Ordered Stop   06/22/22 0600  vancomycin (VANCOREADY) IVPB 1500 mg/300 mL  Status:  Discontinued        1,500 mg 150 mL/hr over 120 Minutes Intravenous Every 36 hours 06/20/22 2030 06/21/22 0714   06/21/22 1000  metroNIDAZOLE (FLAGYL) IVPB 500 mg        500 mg 100 mL/hr over 60 Minutes Intravenous  Every 12 hours 06/20/22 2010     06/21/22 0800  ceFEPIme (MAXIPIME) 2 g in sodium chloride 0.9 % 100 mL IVPB        2 g 200 mL/hr over 30 Minutes Intravenous Every 12 hours 06/20/22 2030     06/21/22 0800  vancomycin (VANCOREADY) IVPB 1500 mg/300 mL        1,500 mg 150 mL/hr over 120 Minutes Intravenous Every 36 hours 06/21/22 0714     06/20/22 1815  vancomycin (VANCOREADY) IVPB 1500 mg/300 mL  Status:  Discontinued        1,500 mg 150 mL/hr over 120 Minutes Intravenous  Once 06/20/22 1807 06/21/22 0714   06/20/22 1800  ceFEPIme (MAXIPIME) 2 g in sodium chloride 0.9 % 100 mL IVPB        2 g 200 mL/hr over 30 Minutes Intravenous  Once 06/20/22 1751 06/20/22 2030   06/20/22 1800  metroNIDAZOLE (FLAGYL) IVPB 500 mg        500 mg 100 mL/hr over 60 Minutes Intravenous  Once 06/20/22 1751 06/21/22 1515   06/20/22 1800  vancomycin (VANCOCIN) IVPB 1000 mg/200 mL premix  Status:  Discontinued  1,000 mg 200 mL/hr over 60 Minutes Intravenous  Once 06/20/22 1751 06/20/22 1807        I have personally reviewed the following labs and images: CBC: Recent Labs  Lab 06/20/22 1436 06/21/22 0439 06/21/22 1234  WBC 9.6 6.9  --   NEUTROABS 7.1 4.5  --   HGB 9.1* 6.9* 8.0*  HCT 28.0* 21.0* 24.4*  MCV 97.9 100.5*  --   PLT 127* 81*  --    BMP &GFR Recent Labs  Lab 06/20/22 1436 06/21/22 0439  NA 132* 136  K 3.7 4.1  CL 105 110  CO2 18* 19*  GLUCOSE 89 75  BUN 12 11  CREATININE 1.79* 1.65*  CALCIUM 8.5* 8.2*  MG 1.3* 1.3*   Estimated Creatinine Clearance: 47.1 mL/min (A) (by C-G formula based on SCr of 1.65 mg/dL (H)). Liver & Pancreas: Recent Labs  Lab 06/20/22 1436 06/21/22 0439  AST 105* 73*  ALT 34 26  ALKPHOS 167* 137*  BILITOT 3.0* 2.1*  PROT 7.6 6.0*  ALBUMIN 2.3* 1.9*   No results for input(s): "LIPASE", "AMYLASE" in the last 168 hours. No results for input(s): "AMMONIA" in the last 168 hours. Diabetic: No results for input(s): "HGBA1C" in the last 72  hours. No results for input(s): "GLUCAP" in the last 168 hours. Cardiac Enzymes: Recent Labs  Lab 06/20/22 2219  CKTOTAL 21*   No results for input(s): "PROBNP" in the last 8760 hours. Coagulation Profile: Recent Labs  Lab 06/20/22 2219  INR 1.7*   Thyroid Function Tests: Recent Labs    06/20/22 2219  TSH 3.633   Lipid Profile: No results for input(s): "CHOL", "HDL", "LDLCALC", "TRIG", "CHOLHDL", "LDLDIRECT" in the last 72 hours. Anemia Panel: No results for input(s): "VITAMINB12", "FOLATE", "FERRITIN", "TIBC", "IRON", "RETICCTPCT" in the last 72 hours. Urine analysis:    Component Value Date/Time   COLORURINE YELLOW 04/30/2022 1310   APPEARANCEUR CLEAR 04/30/2022 1310   LABSPEC 1.010 04/30/2022 1310   PHURINE 5.5 04/30/2022 1310   GLUCOSEU NEGATIVE 04/30/2022 1310   HGBUR MODERATE (A) 04/30/2022 1310   BILIRUBINUR NEGATIVE 04/30/2022 1310   KETONESUR NEGATIVE 04/30/2022 1310   PROTEINUR NEGATIVE 04/30/2022 1310   UROBILINOGEN 0.2 01/09/2013 2111   NITRITE NEGATIVE 04/30/2022 1310   LEUKOCYTESUR SMALL (A) 04/30/2022 1310   Sepsis Labs: Invalid input(s): "PROCALCITONIN", "LACTICIDVEN"  Microbiology: Recent Results (from the past 240 hour(s))  Blood culture (routine x 2)     Status: None (Preliminary result)   Collection Time: 06/20/22  2:36 PM   Specimen: BLOOD  Result Value Ref Range Status   Specimen Description   Final    BLOOD RIGHT ANTECUBITAL Performed at [facility] [address] [place], [place] [address]    Special Requests   Final    BOTTLES DRAWN AEROBIC AND ANAEROBIC Blood Culture adequate volume Performed at [facility] [address] [place], [place] [address]    Culture   Final    NO GROWTH < 24 HOURS Performed at [facility] [address] [place], [place] [address]    Report Status PENDING  Incomplete  Blood culture (routine x 2)     Status: None (Preliminary result)   Collection  Time: 06/20/22  6:11 PM   Specimen: BLOOD  Result Value Ref Range Status   Specimen Description   Final    BLOOD BLOOD RIGHT FOREARM Performed at [facility] [address] [place], [place] [address]    Special  Requests   Final    BOTTLES DRAWN AEROBIC AND ANAEROBIC Blood Culture results may not be optimal due to an inadequate volume of blood received in culture bottles Performed at [facility] [address] [place], [place] [address]    Culture   Final    NO GROWTH < 12 HOURS Performed at [facility] [address], [place], [place] [address]    Report Status PENDING  Incomplete  Resp Panel by RT-PCR (Flu A&B, Covid) Anterior Nasal Swab     Status: None   Collection Time: 06/20/22  6:44 PM   Specimen: Anterior Nasal Swab  Result Value Ref Range Status   SARS Coronavirus 2 by RT PCR NEGATIVE NEGATIVE Final    Comment: (NOTE) SARS-CoV-2 target nucleic acids are NOT DETECTED.  The SARS-CoV-2 RNA is generally detectable in upper respiratory specimens during the acute phase of infection. The lowest concentration of SARS-CoV-2 viral copies this assay can detect is 138 copies/mL. A negative result does not preclude SARS-Cov-2 infection and should not be used as the sole basis for treatment or other patient management decisions. A negative result may occur with  improper specimen collection/handling, submission of specimen other than nasopharyngeal swab, presence of viral mutation(s) within the areas targeted by this assay, and inadequate number of viral copies(<138 copies/mL). A negative result must be combined with clinical observations, patient history, and epidemiological information. The expected result is Negative.  Fact Sheet for Patients:  EntrepreneurPulse.com.au  Fact Sheet for Healthcare Providers:  IncredibleEmployment.be  This test is no t yet approved or cleared by the [place] FDA and  has been authorized for detection and/or diagnosis of SARS-CoV-2 by FDA under an Emergency Use Authorization (EUA). This EUA will remain  in effect (meaning this test can be used) for the duration of the COVID-19 declaration under Section 564(b)(1) of the Act, 21 U.S.C.section 360bbb-3(b)(1), unless the authorization is terminated  or revoked sooner.       Influenza A by PCR NEGATIVE NEGATIVE Final   Influenza B by PCR NEGATIVE NEGATIVE Final    Comment: (NOTE) The Xpert Xpress SARS-CoV-2/FLU/RSV plus assay is intended as an aid in the diagnosis of influenza from Nasopharyngeal swab specimens and should not be used as a sole basis for treatment. Nasal washings and aspirates are unacceptable for Xpert Xpress SARS-CoV-2/FLU/RSV testing.  Fact Sheet for Patients: EntrepreneurPulse.com.au  Fact Sheet for Healthcare Providers: IncredibleEmployment.be  This test is not yet approved or cleared by the [place] FDA and has been authorized for detection and/or diagnosis of SARS-CoV-2 by FDA under an Emergency Use Authorization (EUA). This EUA will remain in effect (meaning this test can be used) for the duration of the COVID-19 declaration under Section 564(b)(1) of the Act, 21 U.S.C. section 360bbb-3(b)(1), unless the authorization is terminated or revoked.  Performed at [facility], [place] [address], [place], [place] [address]   MRSA Next Gen by PCR, Nasal     Status: None   Collection Time: 06/20/22 11:43 PM   Specimen: Nasal Mucosa; Nasal Swab  Result Value Ref Range Status   MRSA by PCR Next Gen NOT DETECTED NOT DETECTED Final    Comment: (NOTE) The GeneXpert MRSA Assay (FDA approved for NASAL specimens only), is one component of a comprehensive MRSA colonization surveillance program. It is not intended to diagnose MRSA infection nor to guide or monitor treatment for MRSA infections. Test performance is  not FDA approved in patients less than 2  years old. Performed at [facility], [place] [address] [place], [place] [address]     Radiology Studies: MR FOOT LEFT WO CONTRAST  Result Date: 06/20/2022 CLINICAL DATA:  Foot swelling, osteomyelitis suspected. Evaluate for abscess. EXAM: MRI OF THE LEFT FOOT WITHOUT CONTRAST TECHNIQUE: Multiplanar, multisequence MR imaging of the left foot was performed. No intravenous contrast was administered. COMPARISON:  Radiographs dated June 20, 2022 FINDINGS: Bones/Joint/Cartilage Marrow signal is within normal limits. No evidence of marrow edema to suggest osteomyelitis. No appreciable joint effusion or significant arthropathy. Ligaments Lisfranc and collateral ligaments are intact. Muscles and Tendons Plantar muscles are normal in signal. No intramuscular collection or abscess. The tendons of the flexor and extensor compartments are intact, evaluation of tendons is however somewhat limited due to motion. Soft tissues Skin irregularity and marked subcutaneous soft tissue edema about the dorsum of the foot. No fluid collection or abscess on this noncontrast enhanced examination. IMPRESSION: 1.  No evidence of osteomyelitis. 2. Marked skin thickening and subcutaneous soft tissue edema about dorsum of the foot without evidence of drainable fluid collection or abscess on this noncontrast enhanced examination. Electronically Signed   By: Keane Police D.O.   On: 06/20/2022 21:28      Shyla Gayheart T. Keeler  If 7PM-7AM, please contact night-coverage [email or web address] 06/21/2022, 5:07 PM

## 2022-06-21 NOTE — Progress Notes (Signed)
Date and time results received: 06/21/22 0600  Test: Blood  Critical Value: Hgb-6.9  Name of Provider Notified: Kathryne Eriksson, NP  Orders Received   Ordered for transfuse 1 unit RBC.

## 2022-06-21 NOTE — Anesthesia Postprocedure Evaluation (Signed)
Anesthesia Post Note  Patient: [name]  Procedure(s) Performed: IRRIGATION AND DEBRIDEMENT EXTREMITY Debridement of Left Foot/APPLICATION OF GRAFT (Left)     Patient location during evaluation: PACU Anesthesia Type: General Level of consciousness: awake Pain management: pain level controlled Vital Signs Assessment: post-procedure vital signs reviewed and stable Respiratory status: spontaneous breathing Cardiovascular status: stable Postop Assessment: no apparent nausea or vomiting Anesthetic complications: no   No notable events documented.  Last Vitals:  Vitals:   06/21/22 1015 06/21/22 1416  BP: 104/78 116/66  Pulse: 88 91  Resp: 16 16  Temp: 36.8 C 37.1 C  SpO2: 99% 100%    Last Pain:  Vitals:   06/21/22 1416  TempSrc: Oral  PainSc:                  Tesla Bochicchio

## 2022-06-21 NOTE — Transfer of Care (Signed)
Immediate Anesthesia Transfer of Care Note  Patient: [name]  Procedure(s) Performed: IRRIGATION AND DEBRIDEMENT EXTREMITY Debridement of Left Foot/APPLICATION OF GRAFT (Left)  Patient Location: PACU  Anesthesia Type:General  Level of Consciousness: drowsy  Airway & Oxygen Therapy: Patient Spontanous Breathing  Post-op Assessment: Report given to RN and Post -op Vital signs reviewed and stable  Post vital signs: Reviewed and stable  Last Vitals:  Vitals Value Taken Time  BP 102/59 06/21/22 1538  Temp    Pulse 78 06/21/22 1542  Resp 24 06/21/22 1542  SpO2 94 % 06/21/22 1542  Vitals shown include unvalidated device data.  Last Pain:  Vitals:   06/21/22 1416  TempSrc: Oral  PainSc:       Patients Stated Pain Goal: 5 (62/83/15 1761)  Complications: No notable events documented.

## 2022-06-21 NOTE — Op Note (Signed)
Full Operative Report  Date of Operation: 3:37 PM, 06/21/2022   Patient: [name] - 46 y.o. female  Surgeon: Yevonne Pax, DPM   Assistant: None  Diagnosis: Infected Ulcer Left Foot  Procedure:  1. Irrigation and excisional debridement of dorsal foot ulcers, 2.3 x 2.1 x 0.4 cm and 1 x 1.4 x 0.5 cm, to level of subcutaneous fat tissue and extensor tendons, Left foot 2. Application of skin substitute graft, 3x4 cm, left foot    Anesthesia: Burgess Amor, MD  Anesthesiologist: Belinda Block, MD CRNA: Milford Cage, CRNA   Estimated Blood Loss: 20 mL  Hemostasis: 1) Anatomical dissection, mechanical compression, electrocautery 2) No tourniquet was used for the procedure  Implants: 3x4 puraply graft  Materials: Prolene, puraply graft  Injectables: 1) Pre-operatively: 10  cc of 0.5% marcaine plain 2) Post-operatively: None  Specimens: Pre and post lavage wound cultures   Antibiotics: Antibiotics given as scheduled from the floor  Drains: None  Complications: Patient tolerated the procedure well without complication.   Findings: as below  Indications for Procedure: [name] presents to Yevonne Pax, DPM with a chief complaint of left foot wound infection and pain failed outpatient antibiotics and unable to tolerate wound debridement in office due to pain.  The patient has failed conservative treatments of various modalities. At this time the patient has elected to proceed with surgical correction. All alternatives, risks, and complications of the procedures were thoroughly explained to the patient. Patient exhibits appropriate understanding of all discussion points and informed consent was signed and obtained in the chart with no guarantees to surgical outcome given or implied.  Description of Procedure: Patient was brought to the operating room and placed on the operative table in the supine position. Patient was secured  to the table with safety belt, a contralateral SCD was placed, and all bony prominences were well padded. A surgical timeout was performed and all members of the operating room, the procedure, and the surgical site were identified. General anesthesia occurred. Local anesthetic as previously described was then injected about the operative field in a local infiltrative block.   The Left lower extremity was then prepped and draped in the usual sterile manner. The following procedure then began.  The wound was completely inspected. There were noted to be two separate dorsal uclers distal forefoot measuring 2.3 x 2.1 x 0.4 cm and 1 x 1.4 x 0.5 cm. The wounds were noted to connect via sinus tract sub q and there was significant undermining of the wound margins. The wound base had fibrotic and necrotic tissue present pre debridement. No purulence or abscess or bone encountered.  Pre debridement deep wound culture was taken. The wound was then debrided. The wound margins were sharply curretaged. The undermined portions were excisionally debrided to remove overlying nonviable tissue with 15 blade scalpel until a healthy oval shaped wound was present. The entire wound base thoroughly excisionally debrided with curettes and rongeurs to the level of healthy viable bleeding tissue level of subcutaneous tissue and a small portion of extensor tendon was exposed in the wound. The wound was then irrigated with 3L of saline with pulse lavage. Post lavage wound culture was taken. The wound measured 5.2 x 4 x 0.5 cm post debridement. Hemostasis was achieved with electrocautery with minimal to no bleeding post cautery. The acellular graft was then placed within the wound bed, covering nearly the entire wound. This was then affixed with prolene suture. Adaptic was placed  over the wound.  The surgical site was then dressed with adaptic saline damp to dry gauze , kerlix, ace wrap. The patient tolerated both the procedure and  anesthesia well with vital signs stable throughout. The patient was transferred from the OR to recovery under the discretion of anesthesia.  Condition: Patient was taken to PACU in good condition and all vital signs stable and neurovascular status intact to the operative limb.  Discharge: Patient will be readmitted to the floor for ongoing IV abx and wound care.  Milt Coye, Jenelle Mages, DPM will follow the patient throughout the entire post-operative course and the patient is aware of all post-operative protocols in place. The patient will follow the protocol of rest, ice, and elevation. The patient will be weightbearing as tolerated in a post op shoe to the operative limb until further instructed. The dressing is to remain clean, dry, and intact.   Carlena Hurl, DPM

## 2022-06-21 NOTE — Telephone Encounter (Signed)
Copied from [place] [id number]. Topic: General - Other >> Jun 21, 2022  1:48 PM Ja-Kwan M wrote: Reason for CRM: Peterson Ao with [employer] reports that they faxed forms on August 30th, September 15th, and September 25th that need to be completed by the pcp but they have not received anything or heard back from anyone. Cb# [phone] Reference# [id number]

## 2022-06-21 NOTE — Discharge Instructions (Signed)
Foot & Ankle Surgery Patient Discharge Instructions:   Arrange to have an adult drive you home after surgery. If you had general anesthesia, it may take a day or more to fully recover. So, for at least the next 24 hours: Do not drive or use machinery or power tools; do not drink alcohol; and do not make any major decisions.   Bandages: Keep your dressings clean, dry, and intact. Do not remove or change. When bathing/showering, cover the bandage or cast with a plastic bag to keep it dry (still keep the area away from the water) and use a chair, do not stand. Don't remove your bandage until your doctor tells you to. If your bandage gets wet or dirty, check with your doctor. You can likely replace it with a clean, dry one.  Activity: Weightbearing as tolerated in post op shoe Sit or lie down when possible. Put a pillow under your heel to raise your foot above the level of your heart. Place ice bag or pack behind knee on surgical side for 20 minutes every hour that you are awake for the first 24-48 hours. You can drive again when instructed by your doctor. Wear your surgical shoe at all times unless told otherwise by your health care provider. Use crutches or a cane as directed. Follow your doctor's instructions about putting weight on your foot.  Diet: Start with liquids and light foods (such as dry toast, bananas, and applesauce). As you feel up to it, slowly return to your normal diet. Drink at least six to eight glasses of water or other nonalcoholic fluids a day. To avoid nausea, eat before taking narcotic pain medications.  Medications: Take all medications as instructed. Take pain medications on time. Do not wait until the pain is bad before taking your medications. Avoid alcohol while on pain medications.  Call your doctor if: Continuous bleeding through dressings. If your dressings get wet. Fevers over 100.4 degrees Fahrenheit (38 degrees Celsius). Chest pains or difficulty  breathing.

## 2022-06-21 NOTE — Progress Notes (Signed)
PODIATRY PROGRESS NOTE Patient Name: [name]  DOB December 08, 1975 DOA 06/20/2022  Hospital Day: 2  Assessment:  46 y.o. female with PMHx significant for GERD, Anemia, chronic foot wound and sacral abscess with Sepsis 2/2 L foot wound infection that has recently worsened. MRI without osteomyelitis or abscess Left foot.   AF, VSS  WBC:6.9 H/H: 8.0/24.4 ESR/CRP: 110/1.5  Wound Cultures: To be collected in OR  Imaging:  XR Foot Left 9/26: Deep dorsal lateral forefoot soft tissue ulceration without radiographic evidence of osteomyelitis. No evidence of tracking soft tissue gas  MRI Left foot WO Contrast 9/26: 1.  No evidence of osteomyelitis.  2. Marked skin thickening and subcutaneous soft tissue edema about dorsum of the foot without evidence of drainable fluid collection or abscess on this noncontrast enhanced examination.  Plan:  -NPO for OR today for Left foot wound debridement, possible wound vac application, possible graft application - Continue IV abx broad spectrum pending further culture data - Anticoagulation: Per primary, currently on hold - Wound care: Dressing applied in OR to be left clean dry and intact - WB status: WBAT to the left foot  Will continue to follow        Everitt Amber, [place]    Subjective:  Patient seen in pre op area, aware of plans for OR today. Has significant pain and drainage from left foot wound. All questions answered. Consent in chart.   Objective:   Vitals:   06/21/22 0843 06/21/22 1015  BP: 101/66 104/78  Pulse: 89 88  Resp: 16 16  Temp: 98.1 F (36.7 C) 98.2 F (36.8 C)  SpO2: 99% 99%       Latest Ref Rng & Units 06/21/2022    4:39 AM 06/20/2022    2:36 PM 05/16/2022    4:33 PM  CBC  WBC 4.0 - 10.5 K/uL 6.9  9.6  13.5   Hemoglobin 12.0 - 15.0 g/dL 6.9  9.1  10.0   Hematocrit 36.0 - 46.0 % 21.0  28.0  27.7   Platelets 150 - 400 K/uL 81  127  135        Latest Ref Rng & Units 06/21/2022     4:39 AM 06/20/2022    2:36 PM 05/16/2022    4:33 PM  BMP  Glucose 70 - 99 mg/dL 75  89  76   BUN 6 - 20 mg/dL _0 Creatinine 0.44 - 1.00 mg/dL 1.65  1.79  1.25   BUN/Creat Ratio 9 - 23   13   Sodium 135 - 145 mmol/L 136  132  140   Potassium 3.5 - 5.1 mmol/L 4.1  3.7  4.0   Chloride 98 - 111 mmol/L 110  105  102   CO2 22 - 32 mmol/L _1 Calcium 8.9 - 10.3 mg/dL 8.2  8.5  9.8     General: AAOx3, NAD  Lower Extremity Exam Physical Exam: Lower Extremity Exam Vasc:     R - PT palpable, DP palpable. Cap refill < 3 sec to digits               L - PT palpable, DP palpable. Cap refill <3 sec to digits   Derm:    R - Normal temp/texture/turgor with no open lesion or clinical signs of infection                L -  Wound: Located dorsal left midfoot, measuring approximately 3x2 cm in ciruclar area x 2 with likely sinus tract connecting these wounds, probe to bone no, odor minimal, drainage yes seropurulent. Wound base with necrotic and fibrotic tissue present.         MSK:     R -  No gross deformities. Compartments soft, non-tender, compressible               L -  2+ pitting edema right foot, severely tender to palpation around the dorsum of the foot and wounds   Neuro:   R - Gross sensation intact. Gross motor function intact                         L - Gross sensation intact. Gross motor function intact   Radiology:  Results reviewed. See assessment for pertinent imaging results

## 2022-06-21 NOTE — Progress Notes (Signed)
Orthopedic Tech Progress Note Patient Details:  [name] 04-12-1976 [id number]  Patient ID: [name], female   DOB: 1975/10/24, 46 y.o.   MRN: [id number]  [name] 06/21/2022, 4:28 PM Left post op shoe applied in pacu

## 2022-06-21 NOTE — Anesthesia Procedure Notes (Signed)
Procedure Name: LMA Insertion Date/Time: 06/21/2022 2:52 PM  Performed by: Milford Cage, CRNAPre-anesthesia Checklist: Patient identified, Emergency Drugs available, Suction available and Patient being monitored Patient Re-evaluated:Patient Re-evaluated prior to induction Oxygen Delivery Method: Circle system utilized Preoxygenation: Pre-oxygenation with 100% oxygen Induction Type: IV induction Ventilation: Mask ventilation without difficulty LMA: LMA inserted LMA Size: 4.0 Number of attempts: 1 Tube secured with: Tape Dental Injury: Teeth and Oropharynx as per pre-operative assessment

## 2022-06-21 NOTE — Anesthesia Postprocedure Evaluation (Signed)
Anesthesia Post Note  Patient: [name]  Procedure(s) Performed: IRRIGATION AND DEBRIDEMENT EXTREMITY Debridement of Left Foot/APPLICATION OF GRAFT (Left)     Patient location during evaluation: PACU Anesthesia Type: General Level of consciousness: awake Pain management: pain level controlled Vital Signs Assessment: post-procedure vital signs reviewed and stable Respiratory status: spontaneous breathing Cardiovascular status: stable Postop Assessment: no apparent nausea or vomiting Anesthetic complications: no   No notable events documented.  Last Vitals:  Vitals:   06/21/22 1630 06/21/22 1651  BP: (!) 97/56 98/64  Pulse: 87 85  Resp: 17 16  Temp:  36.7 C  SpO2: 92% 98%    Last Pain:  Vitals:   06/21/22 1600  TempSrc:   PainSc: Asleep                 Keniesha Adderly

## 2022-06-21 NOTE — Anesthesia Preprocedure Evaluation (Signed)
Anesthesia Evaluation  Patient identified by MRN, date of birth, ID band Patient awake    Reviewed: Allergy & Precautions, NPO status , Patient's Chart, lab work & pertinent test results  Airway Mallampati: II  TM Distance: >3 FB     Dental   Pulmonary pneumonia, Current Smoker and Patient abstained from smoking.,    breath sounds clear to auscultation       Cardiovascular hypertension,  Rhythm:Regular Rate:Normal     Neuro/Psych    GI/Hepatic GERD  ,(+) Hepatitis -  Endo/Other    Renal/GU Renal disease     Musculoskeletal   Abdominal   Peds  Hematology   Anesthesia Other Findings   Reproductive/Obstetrics                             Anesthesia Physical Anesthesia Plan  ASA: 3  Anesthesia Plan: General   Post-op Pain Management:    Induction: Intravenous  PONV Risk Score and Plan: 2 and Ondansetron, Dexamethasone and Midazolam  Airway Management Planned: LMA  Additional Equipment:   Intra-op Plan:   Post-operative Plan: Extubation in OR  Informed Consent: I have reviewed the patients History and Physical, chart, labs and discussed the procedure including the risks, benefits and alternatives for the proposed anesthesia with the patient or authorized representative who has indicated his/her understanding and acceptance.     Dental advisory given  Plan Discussed with: CRNA and Anesthesiologist  Anesthesia Plan Comments:         Anesthesia Quick Evaluation

## 2022-06-21 NOTE — Progress Notes (Signed)
Patient complains of pain in left upper arm around IV site where blood is infusing, crying hysterically and insisting that blood be stopped.  Upon assessment IV site appears to be within normal limits, no redness, no swelling, cool to touch, no s/sx of infiltration or occlusion.  Patient continues to be anxious and hysterically crying, blood transfusion stopped, MD notified.  Patient insists that IV be removed, catheter intact, site clean and dry.

## 2022-06-22 ENCOUNTER — Encounter (HOSPITAL_COMMUNITY): Payer: Self-pay | Admitting: Podiatry

## 2022-06-22 LAB — RETICULOCYTES
Immature Retic Fract: 6.2 % (ref 2.3–15.9)
RBC.: 2.31 MIL/uL — ABNORMAL LOW (ref 3.87–5.11)
Retic Count, Absolute: 34 10*3/uL (ref 19.0–186.0)
Retic Ct Pct: 1.5 % (ref 0.4–3.1)

## 2022-06-22 LAB — COMPREHENSIVE METABOLIC PANEL
ALT: 24 U/L (ref 0–44)
AST: 65 U/L — ABNORMAL HIGH (ref 15–41)
Albumin: 1.9 g/dL — ABNORMAL LOW (ref 3.5–5.0)
Alkaline Phosphatase: 139 U/L — ABNORMAL HIGH (ref 38–126)
Anion gap: 6 (ref 5–15)
BUN: 8 mg/dL (ref 6–20)
CO2: 19 mmol/L — ABNORMAL LOW (ref 22–32)
Calcium: 8.1 mg/dL — ABNORMAL LOW (ref 8.9–10.3)
Chloride: 111 mmol/L (ref 98–111)
Creatinine, Ser: 1.49 mg/dL — ABNORMAL HIGH (ref 0.44–1.00)
GFR, Estimated: 44 mL/min — ABNORMAL LOW (ref >60)
Glucose, Bld: 81 mg/dL (ref 70–99)
Potassium: 3.7 mmol/L (ref 3.5–5.1)
Sodium: 136 mmol/L (ref 135–145)
Total Bilirubin: 2.4 mg/dL — ABNORMAL HIGH (ref 0.3–1.2)
Total Protein: 5.8 g/dL — ABNORMAL LOW (ref 6.5–8.1)

## 2022-06-22 LAB — VITAMIN B12: Vitamin B-12: 1737 pg/mL — ABNORMAL HIGH (ref 180–914)

## 2022-06-22 LAB — CBC
HCT: 22.4 % — ABNORMAL LOW (ref 36.0–46.0)
Hemoglobin: 7.3 g/dL — ABNORMAL LOW (ref 12.0–15.0)
MCH: 32 pg (ref 26.0–34.0)
MCHC: 32.6 g/dL (ref 30.0–36.0)
MCV: 98.2 fL (ref 80.0–100.0)
Platelets: 72 10*3/uL — ABNORMAL LOW (ref 150–400)
RBC: 2.28 MIL/uL — ABNORMAL LOW (ref 3.87–5.11)
RDW: 16.3 % — ABNORMAL HIGH (ref 11.5–15.5)
WBC: 6.6 10*3/uL (ref 4.0–10.5)

## 2022-06-22 LAB — IRON AND TIBC: Iron: 62 ug/dL (ref 28–170)

## 2022-06-22 LAB — HEMOGLOBIN A1C
Hgb A1c MFr Bld: 4.2 % — ABNORMAL LOW (ref 4.8–5.6)
Mean Plasma Glucose: 73.84 mg/dL

## 2022-06-22 LAB — LACTIC ACID, PLASMA: Lactic Acid, Venous: 1.6 mmol/L (ref 0.5–1.9)

## 2022-06-22 LAB — FOLATE: Folate: 9.5 ng/mL (ref >5.9)

## 2022-06-22 LAB — TSH: TSH: 2.547 u[IU]/mL (ref 0.350–4.500)

## 2022-06-22 LAB — MAGNESIUM: Magnesium: 1.6 mg/dL — ABNORMAL LOW (ref 1.7–2.4)

## 2022-06-22 LAB — FERRITIN: Ferritin: 687 ng/mL — ABNORMAL HIGH (ref 11–307)

## 2022-06-22 LAB — PHOSPHORUS: Phosphorus: 3.3 mg/dL (ref 2.5–4.6)

## 2022-06-22 MED ORDER — MAGNESIUM SULFATE 2 GM/50ML IV SOLN
2.0000 g | Freq: Once | INTRAVENOUS | Status: AC
  Administered 2022-06-22: 2 g via INTRAVENOUS
  Filled 2022-06-22: qty 50

## 2022-06-22 MED ORDER — PANTOPRAZOLE SODIUM 40 MG PO TBEC
40.0000 mg | DELAYED_RELEASE_TABLET | Freq: Two times a day (BID) | ORAL | Status: DC
  Administered 2022-06-22 – 2022-06-23 (×2): 40 mg via ORAL
  Filled 2022-06-22 (×2): qty 1

## 2022-06-22 MED ORDER — SODIUM CHLORIDE 0.9 % IV SOLN
3.0000 g | Freq: Four times a day (QID) | INTRAVENOUS | Status: DC
  Administered 2022-06-22 – 2022-06-23 (×3): 3 g via INTRAVENOUS
  Filled 2022-06-22 (×5): qty 8

## 2022-06-22 NOTE — TOC Initial Note (Signed)
Transition of Care [facility]) - Initial/Assessment Note    Patient Details  Name: [name] MRN: [id number] Date of Birth: 1976-07-13  Transition of Care [facility] CM/SW Contact:    Lennart Pall, LCSW Phone Number: 06/22/2022, 3:20 PM  Clinical Narrative:                 Received TOC order to assist pt with community resource specifically for housing/ utility concerns.  Met with pt today to introduce CSW role and review current needs.   Pt confirms that she lives with her mother and brother in an apt but has recently had her disability income (from employer) reduced and affecting her ability to afford her rent.  She confirms that she has been screened via [employer] counseling dept and does not meet critieria for SSD or Medicaid assistance.  Informed pt that we will supply her with information on accessing support via [employer] if she receives an eviction notice as well as other housing support resources. Pt confirms that she does have PCP coverage via [place] program and is followed by [employer] Eden Lathe, RN) as well.  She has her own transportation.  She is followed at the [facility] clinic and has had [facility] services via [employer]. At this point, CSW will await more info on dc planning needs in regards to wound care and abx management.  Will continue to follow.  Expected Discharge Plan: Home/Self Care (vs. Home with [facility]) Barriers to Discharge: Continued Medical Work up   Patient Goals and CMS Choice Patient states their goals for this hospitalization and ongoing recovery are:: return home      Expected Discharge Plan and Services Expected Discharge Plan: Home/Self Care (vs. Home with [facility]) In-house Referral: Clinical Social Work Discharge Planning Services: Medication Assistance   Living arrangements for the past 2 months: Apartment                                      Prior Living Arrangements/Services Living arrangements  for the past 2 months: Apartment Lives with:: Siblings, Parents   Do you feel safe going back to the place where you live?: Yes      Need for Family Participation in Patient Care: Yes (Comment) Care giver support system in place?: Yes (comment)   Criminal Activity/Legal Involvement Pertinent to Current Situation/Hospitalization: No - Comment as needed  Activities of Daily Living Home Assistive Devices/Equipment: Cane (specify quad or straight), Eyeglasses ADL Screening (condition at time of admission) Patient's cognitive ability adequate to safely complete daily activities?: Yes Is the patient deaf or have difficulty hearing?: No Does the patient have difficulty seeing, even when wearing glasses/contacts?: No Does the patient have difficulty concentrating, remembering, or making decisions?: No Patient able to express need for assistance with ADLs?: Yes Does the patient have difficulty dressing or bathing?: No Independently performs ADLs?: Yes (appropriate for developmental age) Does the patient have difficulty walking or climbing stairs?: Yes Weakness of Legs: Both Weakness of Arms/Hands: None  Permission Sought/Granted Permission sought to share information with : Family Supports Permission granted to share information with : Yes, Verbal Permission Granted  Share Information with NAME: Shelbe Haglund     Permission granted to share info w Relationship: mother  Permission granted to share info w Contact Information: [phone]  Emotional Assessment Appearance:: Appears stated age Attitude/Demeanor/Rapport: Gracious Affect (typically observed): Accepting Orientation: :  Oriented to Self, Oriented to Place, Oriented to  Time, Oriented to Situation Alcohol / Substance Use: Not Applicable Psych Involvement: No (comment)  Admission diagnosis:  Wound infection [T14.8XXA, L08.9] Open wound of left foot [S91.302A] Ulcer of left foot with muscle involvement without evidence of  necrosis ([place]) [L97.525] Patient Active Problem List   Diagnosis Date Noted   Open wound of left foot 06/20/2022   Acute hyponatremia 06/20/2022   GERD (gastroesophageal reflux disease) 06/20/2022   Essential hypertension 06/20/2022   Tobacco abuse 06/20/2022   Cellulitis 05/03/2022   Rash 04/30/2022   Anemia 04/25/2022   Lactic acidosis 04/19/2022   Hypokalemia 04/19/2022   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[place]) 04/19/2022   Acute respiratory failure with hypoxia ([place]) 04/19/2022   Aspiration pneumonia ([place]) 04/19/2022   Anasarca 04/19/2022   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[place]) 04/19/2022   Marginal ulcer 04/19/2022   Obesity (BMI 30-39.9) 04/19/2022   Acute on chronic anemia 04/17/2022   Hypovolemic shock ([employer]) 04/17/2022   Thrombocytopenia ([place]) 61/95/0932   Alcoholic hepatitis 67/08/4579   Gastroenteritis due to norovirus 04/17/2022   AKI (acute kidney injury) ([place]) 04/10/2022   Chronic iron deficiency anemia 07/07/2021   Acute cholecystitis due to biliary calculus 11/22/2019   Enlarged lymph nodes in armpit 08/01/2012   PCP:  Kerin Perna, NP Pharmacy:   [id number] [place] [place]Phone: [phone]: [phone] [facility] [address]Phone: [phone]: [address] [place] [place] [address]Phone: [phone]: [phone]    Social Determinants of Health (SDOH) Interventions    Readmission Risk Interventions    06/22/2022    3:14 PM 05/02/2022   11:34 AM  Readmission Risk Prevention Plan  Transportation Screening Complete Complete  Medication Review (RMalcolm Complete Complete  PCP or Specialist appointment within 3-5 days of discharge Complete Complete  HRI or Home Care  Consult Complete Complete  SW Recovery Care/Counseling Consult Complete Complete  Palliative Care Screening Not Applicable Not [place] Applic[place] Not Applicable

## 2022-06-22 NOTE — Progress Notes (Signed)
PODIATRY PROGRESS NOTE Patient Name: [name]  DOB [birth year] DOA 06/20/2022  Hospital Day: 3  Assessment:  46 y.o. female with PMHx significant for GERD, Anemia, chronic foot wound and sacral abscess with Sepsis 2/2 L foot wound infection that has recently worsened. MRI without osteomyelitis or abscess Left foot.   Now POD 1 s/p Left foot wound debridement, puraply graft application, progressing well postop  AF, VSS  Wound Cultures: NGTD, pending  Imaging:  XR Foot Left 9/26: Deep dorsal lateral forefoot soft tissue ulceration without radiographic evidence of osteomyelitis. No evidence of tracking soft tissue gas  MRI Left foot WO Contrast 9/26: 1.  No evidence of osteomyelitis.  2. Marked skin thickening and subcutaneous soft tissue edema about dorsum of the foot without evidence of drainable fluid collection or abscess on this noncontrast enhanced examination.  Plan:  -POD 1 s/p Left foot wound debridement and application of graft progressing as expected post op - Continue IV abx, culture data pending currently NGTD - Anticoagulation: Per primary - Wound care: Keep dressing clean dry and intact. Wound care orders entered for MWF saline wet to dry dressings for left foot wound. PT will need wound care when at home with the same.  - WB status: WBAT to the left foot in post op shoe -Stable for discharge from podiatry standpoint when abx plan for outpatient and Home wound care set up.  - Follow up in 1 week in the office.         Corinna Gab, DPM [facility]    Subjective:  Patient seen earlier this evening, doing well no issues at present.States pain in left foot is improving from prior. Aware of findings from OR.   Objective:   Vitals:   06/22/22 1342 06/22/22 2150  BP: 109/60 115/68  Pulse: (!) 102 83  Resp: 18 18  Temp: 98.5 F (36.9 C) 98.6 F (37 C)  SpO2: 96% 99%       Latest Ref Rng & Units 06/22/2022    4:57 AM 06/21/2022    12:34 PM 06/21/2022    4:39 AM  CBC  WBC 4.0 - 10.5 K/uL 6.6   6.9   Hemoglobin 12.0 - 15.0 g/dL 7.3  8.0  6.9   Hematocrit 36.0 - 46.0 % 22.4  24.4  21.0   Platelets 150 - 400 K/uL 72   81        Latest Ref Rng & Units 06/22/2022    4:57 AM 06/21/2022    4:39 AM 06/20/2022    2:36 PM  BMP  Glucose 70 - 99 mg/dL 81  75  89   BUN 6 - 20 mg/dL 8  11  12    Creatinine 0.44 - 1.00 mg/dL  4.76  5.46   Sodium 135 - 145 mmol/L 136  136  132   Potassium 3.5 - 5.1 mmol/L 3.7  4.1  3.7   Chloride 98 - 111 mmol/L 111  110  105   CO2 22 - 32 mmol/L 19  19  18    Calcium 8.9 - 10.3 mg/dL 8.1  8.2  8.5     General: AAOx3, NAD  Lower Extremity Exam Physical Exam: Lower Extremity Exam Vasc:     R - PT palpable, DP palpable. Cap refill < 3 sec to digits               L - PT palpable, DP palpable. Cap refill <3 sec to digits  Derm:    R - Normal temp/texture/turgor with no open lesion or clinical signs of infection                L - Dressing clean dry and intact    MSK:     R -  No gross deformities. Compartments soft, non-tender, compressible               L -  2+ pitting edema right foot, severely tender to palpation around the dorsum of the foot and wounds   Neuro:   R - Gross sensation intact. Gross motor function intact                         L - Gross sensation intact. Gross motor function intact   Radiology:  Results reviewed. See assessment for pertinent imaging results

## 2022-06-22 NOTE — Progress Notes (Signed)
PROGRESS NOTE  [name]:[id number] DOB: 1976/09/03   PCP: Kerin Perna, NP  Patient is from: Home.  DOA: 06/20/2022 LOS: 2  Chief complaints Chief Complaint  Patient presents with   Wound Infection     Brief Narrative / Interim history: 46 year old F with PMH of chronic left foot wound followed at wound clinic, alcoholic hepatitis, GERD, IDA, tobacco use disorder presenting with worsening left foot wound despite different oral antibiotics and follow-up at wound care clinic, and admitted for infected left foot wound, AKI, hyponatremia and lactic acidosis.  Left foot x-ray with deep dorsal lateral forefoot soft tissue ulceration without radiographic evidence of osteomyelitis.  ESR elevated to 110.  CRP 1.5.  Patient was started on broad-spectrum antibiotics and IV fluid and admitted.  Podiatry consulted.  Patient underwent I&D debridement of dorsal foot ulcers and application of skin substitute graft on 9/27.  Tissue culture obtained and sent.  Reportedly no abscess per podiatry.  Tissue culture NGTD.  Fungal pain and culture pending.  Subjective: Seen and examined earlier this morning.  No major events overnight of this morning.  Complains left foot pain and left hand pain from PIV.  No other complaints.  Objective: Vitals:   06/22/22 0151 06/22/22 0542 06/22/22 1002 06/22/22 1342  BP: (!) 115/55 111/60 111/60 109/60  Pulse: 90 93 79 (!) 102  Resp: _0 Temp: 98.6 F (37 C) 98.7 F (37.1 C) 98.5 F (36.9 C) 98.5 F (36.9 C)  TempSrc: Oral Oral Oral Oral  SpO2: 96% 96% 93% 96%  Weight:      Height:        Examination: GENERAL: No apparent distress.  Nontoxic. HEENT: MMM.  Vision and hearing grossly intact.  NECK: Supple.  No apparent JVD.  RESP:  No IWOB.  Fair aeration bilaterally. CVS:  RRR. Heart sounds normal.  ABD/GI/GU: BS+. Abd soft, NTND.  MSK/EXT:  Moves extremities.  Bulky dressing over left foot.  Neurovascular intact distally SKIN:  Bulky dressing over left foot. NEURO: Awake and alert. Oriented appropriately.  No apparent focal neuro deficit. PSYCH: Calm. Normal affect.   Procedures:  9/27-I&D debridement of left foot ulcers  Microbiology summarized: 9/26-blood cultures NGTD 9/26-MRSA PCR screen negative 9/27-surgical tissue culture NGTD.  Fungal culture pending.   Assessment and plan: Principal Problem:   Open wound of left foot Active Problems:   Alcoholic hepatitis   Chronic iron deficiency anemia   AKI (acute kidney injury) (HCC)   Lactic acidosis   Acute hyponatremia   GERD (gastroesophageal reflux disease)   Essential hypertension   Tobacco abuse   Infected left foot ulcers: Failed outpatient treatment with multiple oral antibiotics and wound care at wound clinic.  Significantly elevated ESR with mild CRP elevation but no leukocytosis or fever.  Blood cultures NGTD.  X-ray does not suggest osteomyelitis.  CRP 1.5 (6.0 about a month ago).  A1c 4.2% but after blood transfusion. -S/p I&D debridement with graft placement by podiatry on 9/24.  Tissue cultures NGTD. -Continue broad-pending ID input -Pain control and VTE prophylaxis -Follow surgical fungal stain and culture  AKI: Likely prerenal from nausea and vomiting.  She is also on hydrocortisone and Aldactone.  Improved. Metabolic acidosis: Likely due to AKI and IV fluid. Recent Labs    04/27/22 0450 04/28/22 0413 04/29/22 0306 05/01/22 8315 05/02/22 0548 05/03/22 0709 05/16/22 1633 06/20/22 1436 06/21/22 0439 06/22/22 0457  BUN 26* 22* 21* 22* 27* 25* _1 CREATININE  1.69* 1.69* 1.63* 1.82* 1.73* 1.87* 1.25* 1.79* 1.65* 1.49*  -Continue IV fluid -Continue monitoring -Continue holding diuretics  Hyponatremia: Likely hypovolemic but she is also on Bactrim, Aldactone and Hygroton.  Resolved.  Iron deficiency anemia: Drop in Hgb likely dilutional from IV fluid.  No evidence of bleeding.  Infused 1 unit but did not complete.   Anemia panel consistent with anemia of chronic disease. Recent Labs    04/29/22 2346 04/30/22 0545 05/01/22 0643 05/02/22 0548 05/03/22 0709 05/16/22 1633 06/20/22 1436 06/21/22 0439 06/21/22 1234 06/22/22 0457  HGB 9.1* 8.6* 9.2* 7.9* 8.3* 10.0* 9.1* 6.9* 8.0* 7.3*  -Monitor H&H  Lactic acidosis: Likely due to dehydration versus sepsis.  Resolved.  Tobacco use disorder -Encourage smoking cessation -Nicotine patch  EtOH hepatitis/transaminitis/hyperbilirubinemia: Mild elevated AST and bilirubin.  CK within normal.  Reportedly quit drinking 3 months ago.  Improving. -Monitor LFT  History of hypertension/hypotension -Hold diuretics. -Continue midodrine  Hypomagnesemia: Improved. -IV magnesium sulfate 2 g x 1  Body mass index is 29.39 kg/m.          DVT prophylaxis:  SCDs Start: 06/20/22 2005  Code Status: Full code Family Communication: None at bedside Level of care: Med-Surg Status is: Inpatient Remains inpatient appropriate because: Left foot infection?   Final disposition: Likely home once medically stable Consultants:  Podiatry Infectious disease  Sch Meds:  Scheduled Meds:  sodium chloride   Intravenous Once   gabapentin  300 mg Oral TID   influenza vac split quadrivalent PF  0.5 mL Intramuscular Tomorrow-1000   midodrine  10 mg Oral TID WC   pantoprazole  40 mg Oral BID AC   pneumococcal 20-valent conjugate vaccine  0.5 mL Intramuscular Tomorrow-1000   Continuous Infusions:  ceFEPime (MAXIPIME) IV 2 g (06/22/22 0724)   metronidazole 500 mg (06/22/22 0923)   vancomycin Stopped (06/21/22 1046)   PRN Meds:.acetaminophen **OR** acetaminophen, fentaNYL (SUBLIMAZE) injection, naLOXone (NARCAN)  injection, nicotine, ondansetron (ZOFRAN) IV  Antimicrobials: Anti-infectives (From admission, onward)    Start     Dose/Rate Route Frequency Ordered Stop   06/22/22 0600  vancomycin (VANCOREADY) IVPB 1500 mg/300 mL  Status:  Discontinued        1,500  mg 150 mL/hr over 120 Minutes Intravenous Every 36 hours 06/20/22 2030 06/21/22 0714   06/21/22 1000  metroNIDAZOLE (FLAGYL) IVPB 500 mg        500 mg 100 mL/hr over 60 Minutes Intravenous Every 12 hours 06/20/22 2010     06/21/22 0800  ceFEPIme (MAXIPIME) 2 g in sodium chloride 0.9 % 100 mL IVPB        2 g 200 mL/hr over 30 Minutes Intravenous Every 12 hours 06/20/22 2030     06/21/22 0800  vancomycin (VANCOREADY) IVPB 1500 mg/300 mL        1,500 mg 150 mL/hr over 120 Minutes Intravenous Every 36 hours 06/21/22 0714     06/20/22 1815  vancomycin (VANCOREADY) IVPB 1500 mg/300 mL  Status:  Discontinued        1,500 mg 150 mL/hr over 120 Minutes Intravenous  Once 06/20/22 1807 06/21/22 0714   06/20/22 1800  ceFEPIme (MAXIPIME) 2 g in sodium chloride 0.9 % 100 mL IVPB        2 g 200 mL/hr over 30 Minutes Intravenous  Once 06/20/22 1751 06/20/22 2030   06/20/22 1800  metroNIDAZOLE (FLAGYL) IVPB 500 mg        500 mg 100 mL/hr over 60 Minutes Intravenous  Once 06/20/22 1751 06/21/22  1515   06/20/22 1800  vancomycin (VANCOCIN) IVPB 1000 mg/200 mL premix  Status:  Discontinued        1,000 mg 200 mL/hr over 60 Minutes Intravenous  Once 06/20/22 1751 06/20/22 1807        I have personally reviewed the following labs and images: CBC: Recent Labs  Lab 06/20/22 1436 06/21/22 0439 06/21/22 1234 06/22/22 0457  WBC 9.6 6.9  --  6.6  NEUTROABS 7.1 4.5  --   --   HGB 9.1* 6.9* 8.0* 7.3*  HCT 28.0* 21.0* 24.4* 22.4*  MCV 97.9 100.5*  --  98.2  PLT 127* 81*  --  72*   BMP &GFR Recent Labs  Lab 06/20/22 1436 06/21/22 0439 06/22/22 0457  NA 132* 136 136  K 3.7 4.1 3.7  CL 105 110 111  CO2 18* 19* 19*  GLUCOSE 89 75 81  BUN _0 CREATININE 1.79* 1.65* 1.49*  CALCIUM 8.5* 8.2* 8.1*  MG 1.3* 1.3* 1.6*  PHOS  --   --  3.3   Estimated Creatinine Clearance: 52.2 mL/min (A) (by C-G formula based on SCr of 1.49 mg/dL (H)). Liver & Pancreas: Recent Labs  Lab 06/20/22 1436  06/21/22 0439 06/22/22 0457  AST 105* 73* 65*  ALT 34 26 24  ALKPHOS 167* 137* 139*  BILITOT 3.0* 2.1* 2.4*  PROT 7.6 6.0* 5.8*  ALBUMIN 2.3* 1.9* 1.9*   No results for input(s): "LIPASE", "AMYLASE" in the last 168 hours. No results for input(s): "AMMONIA" in the last 168 hours. Diabetic: Recent Labs    06/22/22 0457  HGBA1C 4.2*   No results for input(s): "GLUCAP" in the last 168 hours. Cardiac Enzymes: Recent Labs  Lab 06/20/22 2219  CKTOTAL 21*   No results for input(s): "PROBNP" in the last 8760 hours. Coagulation Profile: Recent Labs  Lab 06/20/22 2219  INR 1.7*   Thyroid Function Tests: Recent Labs    06/22/22 0457  TSH 2.547   Lipid Profile: No results for input(s): "CHOL", "HDL", "LDLCALC", "TRIG", "CHOLHDL", "LDLDIRECT" in the last 72 hours. Anemia Panel: Recent Labs    06/22/22 0457  VITAMINB12 1,737*  FOLATE 9.5  FERRITIN 687*  TIBC NOT CALCULATED  IRON 62  RETICCTPCT 1.5   Urine analysis:    Component Value Date/Time   COLORURINE YELLOW 06/21/2022 1853   APPEARANCEUR CLEAR 06/21/2022 1853   LABSPEC 1.010 06/21/2022 1853   PHURINE 6.0 06/21/2022 1853   GLUCOSEU NEGATIVE 06/21/2022 1853   HGBUR SMALL (A) 06/21/2022 1853   BILIRUBINUR NEGATIVE 06/21/2022 1853   KETONESUR NEGATIVE 06/21/2022 1853   PROTEINUR NEGATIVE 06/21/2022 1853   UROBILINOGEN 0.2 01/09/2013 2111   NITRITE NEGATIVE 06/21/2022 1853   LEUKOCYTESUR NEGATIVE 06/21/2022 1853   Sepsis Labs: Invalid input(s): "PROCALCITONIN", "LACTICIDVEN"  Microbiology: Recent Results (from the past 240 hour(s))  Blood culture (routine x 2)     Status: None (Preliminary result)   Collection Time: 06/20/22  2:36 PM   Specimen: BLOOD  Result Value Ref Range Status   Specimen Description   Final    BLOOD RIGHT ANTECUBITAL Performed at [facility] [address], [place], [place] [address]    Special Requests   Final    BOTTLES DRAWN AEROBIC AND ANAEROBIC Blood  Culture adequate volume Performed at [facility] [address], [place], [place] [address]    Culture   Final    NO GROWTH 2 DAYS Performed at [facility]  [address] [place], [place] [address]    Report Status PENDING  Incomplete  Blood culture (routine x 2)     Status: None (Preliminary result)   Collection Time: 06/20/22  6:11 PM   Specimen: BLOOD  Result Value Ref Range Status   Specimen Description   Final    BLOOD BLOOD RIGHT FOREARM Performed at [facility] [address], [place], [place] [address]    Special Requests   Final    BOTTLES DRAWN AEROBIC AND ANAEROBIC Blood Culture results may not be optimal due to an inadequate volume of blood received in culture bottles Performed at [facility] [address], [place], [place] [address]    Culture   Final    NO GROWTH 2 DAYS Performed at [facility] [address], [place], [place] [address]    Report Status PENDING  Incomplete  Resp Panel by RT-PCR (Flu A&B, Covid) Anterior Nasal Swab     Status: None   Collection Time: 06/20/22  6:44 PM   Specimen: Anterior Nasal Swab  Result Value Ref Range Status   SARS Coronavirus 2 by RT PCR NEGATIVE NEGATIVE Final    Comment: (NOTE) SARS-CoV-2 target nucleic acids are NOT DETECTED.  The SARS-CoV-2 RNA is generally detectable in upper respiratory specimens during the acute phase of infection. The lowest concentration of SARS-CoV-2 viral copies this assay can detect is 138 copies/mL. A negative result does not preclude SARS-Cov-2 infection and should not be used as the sole basis for treatment or other patient management decisions. A negative result may occur with  improper specimen collection/handling, submission of specimen other than nasopharyngeal swab, presence of viral mutation(s) within the areas targeted by this assay, and inadequate number of viral copies(<138 copies/mL). A negative result  must be combined with clinical observations, patient history, and epidemiological information. The expected result is Negative.  Fact Sheet for Patients:  EntrepreneurPulse.com.au  Fact Sheet for Healthcare Providers:  IncredibleEmployment.be  This test is no t yet approved or cleared by the [place] FDA and  has been authorized for detection and/or diagnosis of SARS-CoV-2 by FDA under an Emergency Use Authorization (EUA). This EUA will remain  in effect (meaning this test can be used) for the duration of the COVID-19 declaration under Section 564(b)(1) of the Act, 21 U.S.C.section 360bbb-3(b)(1), unless the authorization is terminated  or revoked sooner.       Influenza A by PCR NEGATIVE NEGATIVE Final   Influenza B by PCR NEGATIVE NEGATIVE Final    Comment: (NOTE) The Xpert Xpress SARS-CoV-2/FLU/RSV plus assay is intended as an aid in the diagnosis of influenza from Nasopharyngeal swab specimens and should not be used as a sole basis for treatment. Nasal washings and aspirates are unacceptable for Xpert Xpress SARS-CoV-2/FLU/RSV testing.  Fact Sheet for Patients: EntrepreneurPulse.com.au  Fact Sheet for Healthcare Providers: IncredibleEmployment.be  This test is not yet approved or cleared by the [place] FDA and has been authorized for detection and/or diagnosis of SARS-CoV-2 by FDA under an Emergency Use Authorization (EUA). This EUA will remain in effect (meaning this test can be used) for the duration of the COVID-19 declaration under Section 564(b)(1) of the Act, 21 U.S.C. section 360bbb-3(b)(1), unless the authorization is terminated or revoked.  Performed at [facility], [place] [address], [place], [place] [address]   MRSA Next Gen by PCR, Nasal     Status: None   Collection Time: 06/20/22 11:43 PM   Specimen: Nasal Mucosa;  Nasal Swab  Result Value Ref Range  Status   MRSA by PCR Next Gen NOT DETECTED NOT DETECTED Final    Comment: (NOTE) The GeneXpert MRSA Assay (FDA approved for NASAL specimens only), is one component of a comprehensive MRSA colonization surveillance program. It is not intended to diagnose MRSA infection nor to guide or monitor treatment for MRSA infections. Test performance is not FDA approved in patients less than 15 years old. Performed at [facility], [place] [address], [place], [place] [address]   Aerobic/Anaerobic Culture w Gram Stain (surgical/deep wound)     Status: None (Preliminary result)   Collection Time: 06/21/22  3:06 PM   Specimen: Foot, Left; Abscess  Result Value Ref Range Status   Specimen Description   Final    ABSCESS LEFT FOOT PRE LAVAGE Performed at [facility] [address] [place], [place] [address]    Special Requests   Final    NONE Performed at [facility], [place] [address] [place], [place] [address]    Gram Stain NO WBC SEEN NO ORGANISMS SEEN   Final   Culture   Final    NO GROWTH < 12 HOURS Performed at [facility], [place] [address] [place], [place] [address]    Report Status PENDING  Incomplete  Aerobic/Anaerobic Culture w Gram Stain (surgical/deep wound)     Status: None (Preliminary result)   Collection Time: 06/21/22  3:25 PM   Specimen: Foot, Left; Abscess  Result Value Ref Range Status   Specimen Description   Final    ABSCESS LEFT FOOT POST LAVAGE Performed at [facility] [address], [place], [place] [address]    Special Requests   Final    NONE Performed at [facility], [place] [address], [place], [place] [address]    Gram Stain NO WBC SEEN NO ORGANISMS SEEN   Final   Culture   Final    NO GROWTH < 12 HOURS Performed at [facility], [place] [address], [place], [place] [address]    Report Status PENDING  Incomplete    Radiology Studies: No results  found.    Agamjot Kilgallon T. Carnuel  If 7PM-7AM, please contact night-coverage [email or web address] 06/22/2022, 2:26 PM

## 2022-06-23 ENCOUNTER — Encounter: Payer: Self-pay | Admitting: Physician Assistant

## 2022-06-23 ENCOUNTER — Telehealth (HOSPITAL_COMMUNITY): Payer: Self-pay | Admitting: Pharmacy Technician

## 2022-06-23 ENCOUNTER — Other Ambulatory Visit (HOSPITAL_COMMUNITY): Payer: Self-pay

## 2022-06-23 DIAGNOSIS — L97923 Non-pressure chronic ulcer of unspecified part of left lower leg with necrosis of muscle: Secondary | ICD-10-CM

## 2022-06-23 DIAGNOSIS — L97525 Non-pressure chronic ulcer of other part of left foot with muscle involvement without evidence of necrosis: Secondary | ICD-10-CM

## 2022-06-23 DIAGNOSIS — G621 Alcoholic polyneuropathy: Secondary | ICD-10-CM

## 2022-06-23 LAB — CBC
HCT: 22.3 % — ABNORMAL LOW (ref 36.0–46.0)
Hemoglobin: 7 g/dL — ABNORMAL LOW (ref 12.0–15.0)
MCH: 31.7 pg (ref 26.0–34.0)
MCHC: 31.4 g/dL (ref 30.0–36.0)
MCV: 100.9 fL — ABNORMAL HIGH (ref 80.0–100.0)
Platelets: 74 10*3/uL — ABNORMAL LOW (ref 150–400)
RBC: 2.21 MIL/uL — ABNORMAL LOW (ref 3.87–5.11)
RDW: 16 % — ABNORMAL HIGH (ref 11.5–15.5)
WBC: 7.2 10*3/uL (ref 4.0–10.5)
nRBC: 0 % (ref 0.0–0.2)

## 2022-06-23 LAB — HEMOGLOBIN AND HEMATOCRIT, BLOOD
HCT: 27 % — ABNORMAL LOW (ref 36.0–46.0)
Hemoglobin: 8.7 g/dL — ABNORMAL LOW (ref 12.0–15.0)

## 2022-06-23 LAB — RENAL FUNCTION PANEL
Albumin: 1.8 g/dL — ABNORMAL LOW (ref 3.5–5.0)
Anion gap: 5 (ref 5–15)
BUN: 8 mg/dL (ref 6–20)
CO2: 19 mmol/L — ABNORMAL LOW (ref 22–32)
Calcium: 8.1 mg/dL — ABNORMAL LOW (ref 8.9–10.3)
Chloride: 112 mmol/L — ABNORMAL HIGH (ref 98–111)
Creatinine, Ser: 1.35 mg/dL — ABNORMAL HIGH (ref 0.44–1.00)
GFR, Estimated: 50 mL/min — ABNORMAL LOW (ref >60)
Glucose, Bld: 77 mg/dL (ref 70–99)
Phosphorus: 2.7 mg/dL (ref 2.5–4.6)
Potassium: 3.7 mmol/L (ref 3.5–5.1)
Sodium: 136 mmol/L (ref 135–145)

## 2022-06-23 LAB — PREPARE RBC (CROSSMATCH)

## 2022-06-23 LAB — MAGNESIUM: Magnesium: 2 mg/dL (ref 1.7–2.4)

## 2022-06-23 MED ORDER — OXYCODONE HCL 5 MG PO TABS: 5.0000 mg | ORAL_TABLET | Freq: Three times a day (TID) | ORAL | 0 refills | Status: AC | PRN

## 2022-06-23 MED ORDER — LACTULOSE 10 GM/15ML PO SOLN: 20.0000 g | Freq: Three times a day (TID) | ORAL | 1 refills | Status: DC | PRN

## 2022-06-23 MED ORDER — VANCOMYCIN HCL 1250 MG/250ML IV SOLN: 1250.0000 mg | INTRAVENOUS | Status: DC

## 2022-06-23 MED ORDER — FUROSEMIDE 20 MG PO TABS: 20.0000 mg | ORAL_TABLET | Freq: Every day | ORAL | 11 refills | Status: DC

## 2022-06-23 MED ORDER — SODIUM CHLORIDE 0.9% IV SOLUTION: Freq: Once | INTRAVENOUS | Status: DC

## 2022-06-23 MED ORDER — AMOXICILLIN-POT CLAVULANATE 875-125 MG PO TABS: 1.0000 | ORAL_TABLET | Freq: Two times a day (BID) | ORAL | 0 refills | Status: DC

## 2022-06-23 MED ORDER — ORITAVANCIN DIPHOSPHATE 400 MG IV SOLR
1200.0000 mg | Freq: Once | INTRAVENOUS | Status: AC
  Administered 2022-06-23: 1200 mg via INTRAVENOUS
  Filled 2022-06-23: qty 120

## 2022-06-23 NOTE — Consult Note (Signed)
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 Elinor Parkinson on October 4th and I might myself favor addition of doxycycline to begin after Oritavancin has worn off    [name] has an appointment on 06/28/2022 at 11AM with Dr. Elinor Parkinson at:  [facility], which  is located in the [facility] at  [address] in [place].  [address], which is located to the left of the elevators.  Phone: [phone]  Fax: [phone]  [email or web address]  The patient should arrive 30 minutes prior to their appoitment.   Principal Problem:   Open wound of left foot Active Problems:   Chronic iron deficiency anemia   AKI (acute kidney injury) (HCC)   Alcoholic hepatitis   Lactic acidosis   Acute hyponatremia   GERD (gastroesophageal reflux disease)   Essential hypertension   Tobacco abuse   Scheduled Meds:  sodium chloride   Intravenous Once   sodium chloride   Intravenous Once   gabapentin  300 mg Oral TID   influenza vac split quadrivalent PF  0.5 mL Intramuscular Tomorrow-1000   midodrine  10 mg Oral TID WC   pantoprazole  40 mg Oral BID AC   pneumococcal 20-valent conjugate vaccine  0.5 mL Intramuscular Tomorrow-1000   Continuous Infusions:  ampicillin-sulbactam (UNASYN) IV 3 g (06/23/22 0539)   vancomycin     PRN Meds:.acetaminophen  **OR** acetaminophen, fentaNYL (SUBLIMAZE) injection, naLOXone (NARCAN)  injection, nicotine, ondansetron (ZOFRAN) IV  HPI: [name] is a 46 y.o. female with alcoholism with cirrhosis, admission for alcohol but colic hepatitis iron deficiency anemia tobacco abuse who has had a wound in her left foot that has been present since [facility] June 2023.  She associates the development of the wound with the particular footwear that she was wearing that apparently cut into the skin on the dorsal aspect of her foot.  She has been on various courses of antibiotics including ciprofloxacin and Flagyl Augmentin and doxycycline.  She has been followed closely by wound care.  Prior to admission she had worsening swelling in the left foot with worsening erythema around the opening tenderness no drainage.  He came to the ER where blood cultures were taken.  She was started on broad-spectrum antibiotics in the form of vancomycin cefepime and Flagyl.  Right the foot was performed which showed no evidence of osteomyelitis.  Is taken to the operating room by Dr. Annamary Rummage with podiatry who performed irrigation and excisional debridement of her dorsal foot ulcers with debridement to the level of subcutaneous fat tissue and extensor tendons with application of skin substitute graft.  The operative cultures were taken but have had no growth at 1 day.  Given that she now has exposed tendons I would favor giving her a longer course of oral antibiotics.  We will give her a dose of oritavancin  in today which will essentially give her systemic IV anti-MRSA and broad gram-positive coverage for a week.  I would also have her take Augmentin and complete a month of Augmentin postoperatively.  I will have her follow-up with my partner Dr. Elinor Parkinson on October 4 she can decide at that point time whether or not she wishes to add doxycycline to follow the Oritavancin dose.  Will followup cultures  I spent 84 minutes  with the patient including than 50% of the time in face to face counseling of the patient guarding her nonhealing infected ulcer personally reviewing MRI of the foot updated cultures along with review of medical records in preparation for the visit and during the visit and in coordination of her care.    Review of Systems: Review of Systems  Constitutional:  Positive for malaise/fatigue. Negative for chills, fever and weight loss.  HENT:  Negative for congestion and sore throat.   Eyes:  Negative for blurred vision and photophobia.  Respiratory:  Negative for cough, shortness of breath and wheezing.   Cardiovascular:  Negative for chest pain, palpitations and leg swelling.  Gastrointestinal:  Negative for abdominal pain, blood in stool, constipation, diarrhea, heartburn, melena, nausea and vomiting.  Genitourinary:  Negative for dysuria, flank pain and hematuria.  Musculoskeletal:  Positive for myalgias. Negative for back pain, falls and joint pain.  Skin:  Negative for itching and rash.  Neurological:  Negative for dizziness, focal weakness, loss of consciousness, weakness and headaches.  Endo/Heme/Allergies:  Does not bruise/bleed easily.  Psychiatric/Behavioral:  Negative for depression and suicidal ideas. The patient does not have insomnia.     Past Medical History:  Diagnosis Date   Acid reflux    Anemia    Ulcer     Social History   Tobacco Use   Smoking status: Every Day    Packs/day: 0.25    Years: 25.00    Total pack years: 6.25    Types: Cigarettes    Passive exposure: Current   Smokeless tobacco: Never  Vaping Use   Vaping Use: Never used  Substance Use Topics   Alcohol use: Not Currently    Comment: previously consumed 1-2 glasses of wine per night   Drug use: No    Family History  Problem Relation Age of Onset   Cancer Mother        kidney   Cancer Paternal Aunt    Breast cancer Maternal Aunt    Pancreatic cancer Maternal Aunt    Lung cancer Paternal  Uncle    Allergies  Allergen Reactions   Tape Rash    Clear tape    OBJECTIVE: Blood pressure 108/75, pulse 97, temperature 98.6 F (37 C), temperature source Oral, resp. rate 18, height 5\' 6"  (1.676 m), weight 82.6 kg, last menstrual period 02/23/2022, SpO2 98 %.  Physical Exam Constitutional:      General: She is not in acute distress.    Appearance: Normal appearance. She is well-developed. She is obese. She is not diaphoretic.  HENT:     Head: Normocephalic and atraumatic.     Right Ear: Hearing and external ear normal.     Left Ear: Hearing and external ear normal.     Nose: No nasal deformity or rhinorrhea.  Eyes:     General: No scleral icterus.    Conjunctiva/sclera: Conjunctivae normal.     Right eye: Right conjunctiva is not injected.     Left eye: Left conjunctiva is not injected.  Pupils: Pupils are equal, round, and reactive to light.  Neck:     Vascular: No JVD.  Cardiovascular:     Rate and Rhythm: Normal rate and regular rhythm.     Heart sounds: S1 normal and S2 normal.  Pulmonary:     Effort: Pulmonary effort is normal. No respiratory distress.     Breath sounds: No wheezing.  Abdominal:     General: There is distension.     Palpations: Abdomen is soft.     Tenderness: There is no abdominal tenderness.  Musculoskeletal:     Right shoulder: Normal.     Left shoulder: Normal.     Cervical back: Normal range of motion and neck supple.     Right hip: Normal.     Left hip: Normal.     Right knee: Normal.     Left knee: Normal.  Lymphadenopathy:     Head:     Right side of head: No submandibular, preauricular or posterior auricular adenopathy.     Left side of head: No submandibular, preauricular or posterior auricular adenopathy.     Cervical: No cervical adenopathy.     Right cervical: No superficial or deep cervical adenopathy.    Left cervical: No superficial or deep cervical adenopathy.  Skin:    General: Skin is warm and dry.      Coloration: Skin is not pale.     Findings: No abrasion, bruising, ecchymosis, erythema, lesion or rash.     Nails: There is no clubbing.  Neurological:     General: No focal deficit present.     Mental Status: She is alert and oriented to person, place, and time.     Sensory: No sensory deficit.     Coordination: Coordination normal.     Gait: Gait normal.  Psychiatric:        Attention and Perception: She is attentive.        Mood and Affect: Mood normal.        Speech: Speech normal.        Behavior: Behavior normal. Behavior is cooperative.        Thought Content: Thought content normal.        Judgment: Judgment normal.   Foot bandaged on the left side   Lab Results Lab Results  Component Value Date   WBC 7.2 06/23/2022   HGB 7.0 (L) 06/23/2022   HCT 22.3 (L) 06/23/2022   MCV 100.9 (H) 06/23/2022   PLT 74 (L) 06/23/2022    Lab Results  Component Value Date   CREATININE 1.35 (H) 06/23/2022   BUN 8 06/23/2022   NA 136 06/23/2022   K 3.7 06/23/2022   CL 112 (H) 06/23/2022   CO2 19 (L) 06/23/2022    Lab Results  Component Value Date   ALT 24 06/22/2022   AST 65 (H) 06/22/2022   ALKPHOS 139 (H) 06/22/2022   BILITOT 2.4 (H) 06/22/2022     Microbiology: Recent Results (from the past 240 hour(s))  Blood culture (routine x 2)     Status: None (Preliminary result)   Collection Time: 06/20/22  2:36 PM   Specimen: BLOOD  Result Value Ref Range Status   Specimen Description   Final    BLOOD RIGHT ANTECUBITAL Performed at [facility], [address] [place], [place] [id number]    Special Requests   Final    BOTTLES DRAWN AEROBIC AND ANAEROBIC Blood Culture adequate volume Performed at [facility], [address] [place], [place] [address]    Culture   Final    NO GROWTH 3 DAYS Performed at [facility], [place] [address], [place], [place] [address]    Report Status PENDING  Incomplete  Blood culture (routine x 2)      Status: None (Preliminary result)   Collection Time: 06/20/22  6:11 PM   Specimen: BLOOD  Result Value Ref Range Status   Specimen Description   Final    BLOOD BLOOD RIGHT FOREARM Performed at [place] [address] [place], [place] [address]    Special Requests   Final    BOTTLES DRAWN AEROBIC AND ANAEROBIC Blood Culture results may not be optimal due to an inadequate volume of blood received in culture bottles Performed at [place] [address] [place], [place] [address]    Culture   Final    NO GROWTH 3 DAYS Performed at [facility], [place] [address] [place], [place], [place] [address]    Report Status PENDING  Incomplete  Resp Panel by RT-PCR (Flu A&B, Covid) Anterior Nasal Swab     Status: None   Collection Time: 06/20/22  6:44 PM   Specimen: Anterior Nasal Swab  Result Value Ref Range Status   SARS Coronavirus 2 by RT PCR NEGATIVE NEGATIVE Final    Comment: (NOTE) SARS-CoV-2 target nucleic acids are NOT DETECTED.  The SARS-CoV-2 RNA is generally detectable in upper respiratory specimens during the acute phase of infection. The lowest concentration of SARS-CoV-2 viral copies this assay can detect is 138 copies/mL. A negative result does not preclude SARS-Cov-2 infection and should not be used as the sole basis for treatment or other patient management decisions. A negative result may occur with  improper specimen collection/handling, submission of specimen other than nasopharyngeal swab, presence of viral mutation(s) within the areas targeted by this assay, and inadequate number of viral copies(<138 copies/mL). A negative result must be combined with clinical observations, patient history, and epidemiological information. The expected result is Negative.  Fact Sheet for Patients:  EntrepreneurPulse.com.au  Fact Sheet for Healthcare Providers:  IncredibleEmployment.be  This test is no  t yet approved or cleared by the [place] FDA and  has been authorized for detection and/or diagnosis of SARS-CoV-2 by FDA under an Emergency Use Authorization (EUA). This EUA will remain  in effect (meaning this test can be used) for the duration of the COVID-19 declaration under Section 564(b)(1) of the Act, 21 U.S.C.section 360bbb-3(b)(1), unless the authorization is terminated  or revoked sooner.       Influenza A by PCR NEGATIVE NEGATIVE Final   Influenza B by PCR NEGATIVE NEGATIVE Final    Comment: (NOTE) The Xpert Xpress SARS-CoV-2/FLU/RSV plus assay is intended as an aid in the diagnosis of influenza from Nasopharyngeal swab specimens and should not be used as a sole basis for treatment. Nasal washings and aspirates are unacceptable for Xpert Xpress SARS-CoV-2/FLU/RSV testing.  Fact Sheet for Patients: EntrepreneurPulse.com.au  Fact Sheet for Healthcare Providers: IncredibleEmployment.be  This test is not yet approved or cleared by the [place] FDA and has been authorized for detection and/or diagnosis of SARS-CoV-2 by FDA under an Emergency Use Authorization (EUA). This EUA will remain in effect (meaning this test can be used) for the duration of the COVID-19 declaration under Section 564(b)(1) of the Act, 21 U.S.C. section 360bbb-3(b)(1), unless the authorization is terminated or revoked.  Performed at [facility], [address] [place], [place] [address]   MRSA Next Gen by PCR, Nasal     Status: None   Collection Time: 06/20/22 11:43 PM   Specimen: Nasal Mucosa; Nasal Swab  Result Value Ref Range Status   MRSA by PCR Next Gen NOT DETECTED NOT DETECTED Final    Comment: (NOTE) The GeneXpert MRSA Assay (FDA approved for NASAL specimens only), is one component of a comprehensive MRSA colonization surveillance program. It is not intended to diagnose MRSA infection nor to guide or monitor treatment  for MRSA infections. Test performance is not FDA approved in patients less than 69 years old. Performed at [facility], [address] [place], [place] [address]   Aerobic/Anaerobic Culture w Gram Stain (surgical/deep wound)     Status: None (Preliminary result)   Collection Time: 06/21/22  3:06 PM   Specimen: Foot, Left; Abscess  Result Value Ref Range Status   Specimen Description   Final    ABSCESS LEFT FOOT PRE LAVAGE Performed at [facility], [address], [place], [place] [address]    Special Requests   Final    NONE Performed at [facility], [address] [place], [place] [address]    Gram Stain NO WBC SEEN NO ORGANISMS SEEN   Final   Culture   Final    NO GROWTH < 12 HOURS Performed at [facility], [address] [place], [place] [address]    Report Status PENDING  Incomplete  Aerobic/Anaerobic Culture w Gram Stain (surgical/deep wound)     Status: None (Preliminary result)   Collection Time: 06/21/22  3:25 PM   Specimen: Foot, Left; Abscess  Result Value Ref Range Status   Specimen Description   Final    ABSCESS LEFT FOOT POST LAVAGE Performed at [facility], [address], [place], [place] [address]    Special Requests   Final    NONE Performed at [facility], [address] [place], [place] [address]    Gram Stain NO WBC SEEN NO ORGANISMS SEEN   Final   Culture   Final    NO GROWTH < 12 HOURS Performed at [facility], [address], [place], [place] [address]    Report Status PENDING  Incomplete    Acey Lav, MD [facility] [phone] pager  06/23/2022, 9:02 AM

## 2022-06-23 NOTE — Progress Notes (Signed)
Discharge instructions discussed with patient, verbalized agreement and understanding 

## 2022-06-23 NOTE — TOC Transition Note (Signed)
Transition of Care [facility]) - CM/SW Discharge Note   Patient Details  Name: [name] MRN: [id number] Date of Birth: 17-Jan-1976  Transition of Care [facility] CM/SW Contact:  Lennart Pall, LCSW Phone Number: 06/23/2022, 3:41 PM   Clinical Narrative:     Pt medically cleared for dc today.  Provided her with [employer] and with Good Rx coupons.  Unfortunately, she used the [facility] fill just a month ago so cannot use this again.  No further TOC needs.  Final next level of care: Home/Self Care Barriers to Discharge: Barriers Resolved   Patient Goals and CMS Choice Patient states their goals for this hospitalization and ongoing recovery are:: return home      Discharge Placement                       Discharge Plan and Services In-house Referral: Clinical Social Work Discharge Planning Services: Medication Assistance            DME Arranged: N/A DME Agency: NA                  Social Determinants of Health (SDOH) Interventions     Readmission Risk Interventions    06/22/2022    3:14 PM 05/02/2022   11:34 AM  Readmission Risk Prevention Plan  Transportation Screening Complete Complete  Medication Review Press photographer) Complete Complete  PCP or Specialist appointment within 3-5 days of discharge Complete Complete  HRI or [place] Complete Complete  SW Recovery Care/Counseling Consult Complete Complete  Palliative Care Screening Not Applicable Not [place] Not Applicable Not Applicable

## 2022-06-23 NOTE — Telephone Encounter (Signed)
Pharmacy Patient Advocate Encounter  Insurance verification completed.    The patient does not have prescription coverage  The patient is currently admitted and ran test claims for the following: [place].  Copays and coinsurance results were relayed to Inpatient clinical team.

## 2022-06-23 NOTE — Progress Notes (Signed)
Pharmacy Antibiotic Note  [name] is a 46 y.o. female admitted on 06/20/2022 with infected left foot wound.  Pharmacy has been consulted for vancomycin dosing.  Plan: Adjust Vancomycin 1250 mg IV q24h (SCr improved 1.35, est AUC 459, Vd 0.72) Measure Vanc levels as needed.  Goal AUC = 400 - 550  Continue Unasyn 3g IV q6h per ID Follow up renal function, culture results, and clinical course.   Height: 5\' 6"  (167.6 cm) Weight: 82.6 kg (182 lb 1.6 oz) IBW/kg (Calculated) : 59.3  Temp (24hrs), Avg:98.6 F (37 C), Min:98.5 F (36.9 C), Max:98.6 F (37 C)  Recent Labs  Lab 06/20/22 1400 06/20/22 1436 06/20/22 1600 06/20/22 2019 06/20/22 2219 06/21/22 0439 06/22/22 0457 06/23/22 0515  WBC  --  9.6  --   --   --  6.9 6.6 7.2  CREATININE  --  1.79*  --   --   --  1.65* 1.49* 1.35*  LATICACIDVEN 3.2*  --  3.7* 2.9* 2.8*  --  1.6  --      Estimated Creatinine Clearance: 57.6 mL/min (A) (by C-G formula based on SCr of 1.35 mg/dL (H)).    Allergies  Allergen Reactions   Tape Rash    Clear tape    Antimicrobials this admission: 9/26 cefepime >> 9/28 9/28 Unasyn>> 9/26 metronidazole >> 9/28 9/26 vancomycin >>   Dose adjustments this admission:  Microbiology results: 9/26 BCx: ngtd 9/26 MRSA PCR: neg 9/27 L foot abscess pre lavage: ngtd 9/27 L foot abscess post lavage: ngtd 9/27 L foot abscess fungus cx: sent  Thank you for allowing pharmacy to be a part of this patient's care.  Peggyann Juba, PharmD, BCPS [facility] main pharmacy [phone] 06/23/2022 7:43 AM

## 2022-06-23 NOTE — Telephone Encounter (Signed)
Contacted [employer] and spoke to Amy and made aware that patient is aware that her paperwork will need an appt or she can have one of her other doctors to fill it. Per Amy she will notate pt chart. Contacted pt to make her aware per pt she will try and get her podiatry doctor to fill it out. Pt doesn't have any questions or concerns

## 2022-06-23 NOTE — Progress Notes (Signed)
[name], [name] ([id number]) Visit Report for 06/19/2022 Arrival Information Details Patient Name: Date of Service: [name], [name] 06/19/2022 3:30 PM Medical Record Number: [id number] Patient Account Number: [id number] Date of Birth/Sex: Treating RN: [birth year] (46 y.o. Roselee Nova, Jamie Primary Care Teigen Parslow: Gwinda Passe Other Clinician: Referring Rilie Glanz: Treating Roanne Haye/Extender: Sharyl Nimrod in Treatment: 3 Visit Information History Since Last Visit All ordered tests and consults were completed: Yes Patient Arrived: Cane Added or deleted any medications: No Arrival Time: 16:00 Any new allergies or adverse reactions: No Accompanied By: self Had a fall or experienced change in No Transfer Assistance: None activities of daily living that may affect Patient Requires Transmission-Based Precautions: No risk of falls: Patient Has Alerts: No Signs or symptoms of abuse/neglect since last visito No Hospitalized since last visit: No Implantable device outside of the clinic excluding No cellular tissue based products placed in the center since last visit: Has Dressing in Place as Prescribed: Yes Pain Present Now: Yes Electronic Signature(s) Signed: 06/23/2022 4:38:59 PM By: Tommie Ard RN Entered By: Tommie Ard on 06/19/2022 16:00:36 -------------------------------------------------------------------------------- Encounter Discharge Information Details Patient Name: Date of Service: Lori Kennedy R. 06/19/2022 3:30 PM Medical Record Number: [id number] Patient Account Number: [id number] Date of Birth/Sex: Treating RN: 09/08/1976 (46 y.o. Kateri Mc Primary Care Jaylan Duggar: Gwinda Passe Other Clinician: Referring Klaire Court: Treating Alysah Carton/Extender: Sharyl Nimrod in Treatment: 3 Encounter Discharge Information Items Post Procedure Vitals Discharge Condition: Stable Temperature (F):  98.3 Ambulatory Status: Ambulatory Pulse (bpm): 121 Discharge Destination: Home Respiratory Rate (breaths/min): 18 Transportation: Private Auto Blood Pressure (mmHg): 109/69 Accompanied By: self Schedule Follow-up Appointment: No Clinical Summary of Care: Notes Patient advised by Dr. Lady Gary to go to the emergency room to have dorsal foot wound debrided Electronic Signature(s) Signed: 06/23/2022 4:38:59 PM By: Tommie Ard RN Entered By: Tommie Ard on 06/19/2022 16:28:40 -------------------------------------------------------------------------------- Lower Extremity Assessment Details Patient Name: Date of Service: [name], [name] 06/19/2022 3:30 PM Medical Record Number: [id number] Patient Account Number: [id number] Date of Birth/Sex: Treating RN: Apr 05, 1976 (46 y.o. Kateri Mc Primary Care Wende Longstreth: Gwinda Passe Other Clinician: Referring Teja Judice: Treating Isadore Palecek/Extender: Sharyl Nimrod in Treatment: 3 Edema Assessment Assessed: [Left: No] [Right: No] E[Left: dema] [Right: :] Calf Left: Right: Point of Measurement: From Medial Instep 38.5 cm Ankle Left: Right: Point of Measurement: From Medial Instep 30 cm Vascular Assessment Pulses: Dorsalis Pedis Palpable: [Left:Yes] Electronic Signature(s) Signed: 06/23/2022 4:38:59 PM By: Tommie Ard RN Entered By: Tommie Ard on 06/19/2022 16:01:20 -------------------------------------------------------------------------------- Multi Wound Chart Details Patient Name: Date of Service: Lori Kennedy R. 06/19/2022 3:30 PM Medical Record Number: [id number] Patient Account Number: [id number] Date of Birth/Sex: Treating RN: 03-28-1976 (46 y.o. Tommye Standard Primary Care Thedora Rings: Gwinda Passe Other Clinician: Referring Gerlene Glassburn: Treating Ezriel Boffa/Extender: Sharyl Nimrod in Treatment: 3 Vital Signs Height(in): 66 Pulse(bpm):  121 Weight(lbs): 197 Blood Pressure(mmHg): 109/69 Body Mass Index(BMI): 31.8 Temperature(F): 98.3 Respiratory Rate(breaths/min): 18 Photos: [1:Left, Dorsal Foot] [2:Left Gluteal fold] [N/A:N/A N/A] Wound Location: [1:Shear/Friction] [2:Shear/Friction] [N/A:N/A] Wounding Event: [1:Trauma, Other] [2:Trauma, Other] [N/A:N/A] Primary Etiology: [1:Anemia, Lymphedema, Hypertension, Anemia, Lymphedema, Hypertension,] [N/A:N/A] Comorbid History: [1:Hypotension, Cirrhosis 05/20/2022] [2:Hypotension, Cirrhosis 03/20/2022] [N/A:N/A] Date Acquired: [1:3] [2:3] [N/A:N/A] Weeks of Treatment: [1:Open] [2:Open] [N/A:N/A] Wound Status: [1:No] [2:No] [N/A:N/A] Wound Recurrence: [1:4.8x2.2x0.6] [2:6x6x0.1] [N/A:N/A] Measurements L x W x D (cm) [1:8.294] [2:28.274] [N/A:N/A] A (cm) : rea [1:4.976] [2:2.827] [N/A:N/A] Volume (cm) : [1:-1073.10%] [2:-11.80%] [N/A:N/A] % Reduction in A [1:rea: -6908.50%] [  2:91.40%] [N/A:N/A] % Reduction in Volume: [2:12] Position 1 (o'clock): [2:4.5] Maximum Distance 1 (cm): [1:12] [2:12] Starting Position 1 (o'clock): [1:12] [2:12] Ending Position 1 (o'clock): [1:3.5] [2:2] Maximum Distance 1 (cm): [1:No] [2:Yes] [N/A:N/A] Tunneling: [1:Yes] [2:Yes] [N/A:N/A] Undermining: [1:Partial Thickness] [2:Full Thickness Without Exposed] [N/A:N/A] Classification: [1:Medium] [2:Support Structures Medium] [N/A:N/A] Exudate A mount: [1:Serosanguineous] [2:Serosanguineous] [N/A:N/A] Exudate Type: [1:red, brown] [2:red, brown] [N/A:N/A] Exudate Color: [1:Small (1-33%)] [2:Medium (34-66%)] [N/A:N/A] Granulation A mount: [1:Red, Pink] [2:Red, Pink] [N/A:N/A] Granulation Quality: [1:Large (67-100%)] [2:Medium (34-66%)] [N/A:N/A] Necrotic A mount: [1:Eschar, Adherent Slough] [2:Adherent Slough] [N/A:N/A] Necrotic Tissue: [1:Fat Layer (Subcutaneous Tissue): Yes Fascia: No] [N/A:N/A] Exposed Structures: [1:Fascia: No Tendon: No Muscle: No Joint: No Bone: No None] [2:Fat Layer  (Subcutaneous Tissue): No Tendon: No Muscle: No Joint: No Bone: No Small (1-33%)] [N/A:N/A] Epithelialization: [1:Debridement - Selective/Open Wound N/A] [N/A:N/A] Debridement: Pre-procedure Verification/Time Out 16:19 [2:N/A] [N/A:N/A] Taken: [1:Lidocaine 5% topical ointment] [2:N/A] [N/A:N/A] Pain Control: [1:Non-Viable Tissue] [2:N/A] [N/A:N/A] Level: [1:10.56] [2:N/A] [N/A:N/A] Debridement A (sq cm): [1:rea Curette] [2:N/A] [N/A:N/A] Instrument: [1:Minimum] [2:N/A] [N/A:N/A] Bleeding: [1:Pressure] [2:N/A] [N/A:N/A] Hemostasis A chieved: [1:0] [2:N/A] [N/A:N/A] Procedural Pain: [1:0] [2:N/A] [N/A:N/A] Post Procedural Pain: [1:Procedure was tolerated well] [2:N/A] [N/A:N/A] Debridement Treatment Response: [1:4.8x2.2x0.6] [2:N/A] [N/A:N/A] Post Debridement Measurements L x W x D (cm) [1:4.976] [2:N/A] [N/A:N/A] Post Debridement Volume: (cm) [1:Debridement] [2:N/A] [N/A:N/A] Treatment Notes Wound #1 (Foot) Wound Laterality: Dorsal, Left Cleanser Soap and Water Discharge Instruction: May shower and wash wound with dial antibacterial soap and water prior to dressing change. Wound Cleanser Discharge Instruction: Cleanse the wound with wound cleanser prior to applying a clean dressing using gauze sponges, not tissue or cotton balls. Peri-Wound Care Topical Keystone Primary Dressing KerraCel Ag Gelling Fiber Dressing, 2x2 in (silver alginate) Discharge Instruction: Apply silver alginate to wound bed as instructed Secondary Dressing Woven Gauze Sponge, Non-Sterile 4x4 in Discharge Instruction: Apply over primary dressing as directed. Secured With Elastic Bandage 4 inch (ACE bandage) Discharge Instruction: Secure with ACE bandage as directed. Kerlix Roll Sterile, 4.5x3.1 (in/yd) Discharge Instruction: Secure with Kerlix as directed. Compression Wrap Compression Stockings Add-Ons Wound #2 (Gluteal fold) Wound Laterality: Left Cleanser Soap and Water Discharge Instruction: May  shower and wash wound with dial antibacterial soap and water prior to dressing change. Wound Cleanser Discharge Instruction: Cleanse the wound with wound cleanser prior to applying a clean dressing using gauze sponges, not tissue or cotton balls. Peri-Wound Care Topical Primary Dressing KerraCel Ag Gelling Fiber Dressing, 4x5 in (silver alginate) Discharge Instruction: Apply silver alginate to wound bed as instructed Secondary Dressing ABD Pad, 5x9 Discharge Instruction: Apply over primary dressing as directed. Woven Gauze Sponge, Non-Sterile 4x4 in Discharge Instruction: Apply over primary dressing as directed. Secured With Compression Wrap Compression Stockings Facilities manager) Signed: 06/19/2022 4:27:59 PM By: Duanne Guess MD FACS Signed: 06/19/2022 5:20:46 PM By: Zenaida Deed RN, BSN Entered By: Duanne Guess on 06/19/2022 16:27:59 -------------------------------------------------------------------------------- Multi-Disciplinary Care Plan Details Patient Name: Date of Service: Lori Kennedy R. 06/19/2022 3:30 PM Medical Record Number: [id number] Patient Account Number: [id number] Date of Birth/Sex: Treating RN: 02-22-1976 (45 y.o. Kateri Mc Primary Care Tanaia Hawkey: Gwinda Passe Other Clinician: Referring Bowe Sidor: Treating Finnlee Silvernail/Extender: Sharyl Nimrod in Treatment: 3 Active Inactive Pressure Nursing Diagnoses: Knowledge deficit related to causes and risk factors for pressure ulcer development Potential for impaired tissue integrity related to pressure, friction, moisture, and shear Goals: Patient will remain free from development of additional pressure ulcers Date Initiated: 05/24/2022 Target Resolution Date: 06/30/2022 Goal Status:  Active Patient/caregiver will verbalize risk factors for pressure ulcer development Date Initiated: 05/24/2022 Target Resolution Date: 06/30/2022 Goal Status:  Active Interventions: Assess: immobility, friction, shearing, incontinence upon admission and as needed Assess potential for pressure ulcer upon admission and as needed Notes: Wound/Skin Impairment Nursing Diagnoses: Impaired tissue integrity Knowledge deficit related to ulceration/compromised skin integrity Goals: Patient will demonstrate a reduced rate of smoking or cessation of smoking Date Initiated: 05/24/2022 Target Resolution Date: 06/30/2022 Goal Status: Active Patient/caregiver will verbalize understanding of skin care regimen Date Initiated: 05/24/2022 Target Resolution Date: 06/30/2022 Goal Status: Active Interventions: Assess patient/caregiver ability to obtain necessary supplies Assess ulceration(s) every visit Treatment Activities: Skin care regimen initiated : 05/24/2022 Topical wound management initiated : 05/24/2022 Notes: Electronic Signature(s) Signed: 06/23/2022 4:38:59 PM By: Blanche East RN Entered By: Blanche East on 06/19/2022 16:03:35 -------------------------------------------------------------------------------- Pain Assessment Details Patient Name: Date of Service: Lori Hawking R. 06/19/2022 3:30 PM Medical Record Number: [id number] Patient Account Number: [id number] Date of Birth/Sex: Treating RN: 1976-06-30 (46 y.o. Marta Lamas Primary Care Dameshia Seybold: Juluis Mire Other Clinician: Referring Brigitta Pricer: Treating Sadee Osland/Extender: Patrici Ranks in Treatment: 3 Active Problems Location of Pain Severity and Description of Pain Patient Has Paino Yes Site Locations Rate the pain. Rate the pain. Current Pain Level: 8 Character of Pain Describe the Pain: Burning Pain Management and Medication Current Pain Management: Electronic Signature(s) Signed: 06/23/2022 4:38:59 PM By: Blanche East RN Entered By: Blanche East on 06/19/2022  16:01:08 -------------------------------------------------------------------------------- Patient/Caregiver Education Details Patient Name: Date of Service: [name] 9/25/2023andnbsp3:30 PM Medical Record Number: [id number] Patient Account Number: [id number] Date of Birth/Gender: Treating RN: 18-Mar-1976 (46 y.o. Marta Lamas Primary Care Physician: Juluis Mire Other Clinician: Referring Physician: Treating Physician/Extender: Patrici Ranks in Treatment: 3 Education Assessment Education Provided To: Patient Education Topics Provided Wound/Skin Impairment: Methods: Explain/Verbal Responses: Reinforcements needed, State content correctly Electronic Signature(s) Signed: 06/23/2022 4:38:59 PM By: Blanche East RN Entered By: Blanche East on 06/19/2022 16:03:53 -------------------------------------------------------------------------------- Wound Assessment Details Patient Name: Date of Service: [name]. 06/19/2022 3:30 PM Medical Record Number: [id number] Patient Account Number: [id number] Date of Birth/Sex: Treating RN: 1975-10-08 (46 y.o. Iver Nestle, Jamie Primary Care Fawaz Borquez: Other Clinician: Juluis Mire Referring Monica Codd: Treating Dari Carpenito/Extender: Patrici Ranks in Treatment: 3 Wound Status Wound Number: 1 Primary Etiology: Trauma, Other Wound Location: Left, Dorsal Foot Wound Status: Open Wounding Event: Shear/Friction Comorbid Anemia, Lymphedema, Hypertension, Hypotension, History: Cirrhosis Date Acquired: 05/20/2022 Weeks Of Treatment: 3 Clustered Wound: No Photos Wound Measurements Length: (cm) 4.8 Width: (cm) 2.2 Depth: (cm) 0.6 Area: (cm) 8.294 Volume: (cm) 4.976 % Reduction in Area: -1073.1% % Reduction in Volume: -6908.5% Epithelialization: None Tunneling: No Undermining: Yes Starting Position (o'clock): 12 Ending Position (o'clock): 12 Maximum  Distance: (cm) 3.5 Wound Description Classification: Partial Thickness Exudate Amount: Medium Exudate Type: Serosanguineous Exudate Color: red, brown Foul Odor After Cleansing: No Slough/Fibrino Yes Wound Bed Granulation Amount: Small (1-33%) Exposed Structure Granulation Quality: Red, Pink Fascia Exposed: No Necrotic Amount: Large (67-100%) Fat Layer (Subcutaneous Tissue) Exposed: Yes Necrotic Quality: Eschar, Adherent Slough Tendon Exposed: No Muscle Exposed: No Joint Exposed: No Bone Exposed: No Treatment Notes Wound #1 (Foot) Wound Laterality: Dorsal, Left Cleanser Soap and Water Discharge Instruction: May shower and wash wound with dial antibacterial soap and water prior to dressing change. Wound Cleanser Discharge Instruction: Cleanse the wound with wound cleanser prior to applying a clean dressing using gauze sponges, not tissue or cotton balls. Peri-Wound  Care Topical Keystone Primary Dressing KerraCel Ag Gelling Fiber Dressing, 2x2 in (silver alginate) Discharge Instruction: Apply silver alginate to wound bed as instructed Secondary Dressing Woven Gauze Sponge, Non-Sterile 4x4 in Discharge Instruction: Apply over primary dressing as directed. Secured With Elastic Bandage 4 inch (ACE bandage) Discharge Instruction: Secure with ACE bandage as directed. Kerlix Roll Sterile, 4.5x3.1 (in/yd) Discharge Instruction: Secure with Kerlix as directed. Compression Wrap Compression Stockings Add-Ons Electronic Signature(s) Signed: 06/19/2022 5:29:08 PM By: Karie Schwalbe RN Signed: 06/23/2022 4:38:59 PM By: Tommie Ard RN Entered By: Karie Schwalbe on 06/19/2022 16:10:17 -------------------------------------------------------------------------------- Wound Assessment Details Patient Name: Date of Service: Lori Kennedy R. 06/19/2022 3:30 PM Medical Record Number: [id number] Patient Account Number: [id number] Date of Birth/Sex: Treating RN: 09-29-1975 (46 y.o. Katrinka Blazing Primary Care Remi Lopata: Gwinda Passe Other Clinician: Referring Daijha Leggio: Treating Ladelle Teodoro/Extender: Sharyl Nimrod in Treatment: 3 Wound Status Wound Number: 2 Primary Etiology: Trauma, Other Wound Location: Left Gluteal fold Wound Status: Open Wounding Event: Shear/Friction Comorbid Anemia, Lymphedema, Hypertension, Hypotension, History: Cirrhosis Date Acquired: 03/20/2022 Weeks Of Treatment: 3 Clustered Wound: No Photos Wound Measurements Length: (cm) 6 Width: (cm) 6 Depth: (cm) 0.1 Area: (cm) 28.274 Volume: (cm) 2.827 % Reduction in Area: -11.8% % Reduction in Volume: 91.4% Epithelialization: Small (1-33%) Tunneling: Yes Position (o'clock): 12 Maximum Distance: (cm) 4.5 Undermining: Yes Starting Position (o'clock): 12 Ending Position (o'clock): 12 Maximum Distance: (cm) 2 Wound Description Classification: Full Thickness Without Exposed Support Structures Exudate Amount: Medium Exudate Amount: Medium Exudate Type: Serosanguineous Exudate Color: red, brown Foul Odor After Cleansing: No Slough/Fibrino No Slough/Fibrino No Wound Bed Granulation Amount: Medium (34-66%) Exposed Structure Granulation Quality: Red, Pink Fascia Exposed: No Necrotic Amount: Medium (34-66%) Fat Layer (Subcutaneous Tissue) Exposed: No Necrotic Quality: Adherent Slough Tendon Exposed: No Muscle Exposed: No Joint Exposed: No Bone Exposed: No Treatment Notes Wound #2 (Gluteal fold) Wound Laterality: Left Cleanser Soap and Water Discharge Instruction: May shower and wash wound with dial antibacterial soap and water prior to dressing change. Wound Cleanser Discharge Instruction: Cleanse the wound with wound cleanser prior to applying a clean dressing using gauze sponges, not tissue or cotton balls. Peri-Wound Care Topical Primary Dressing KerraCel Ag Gelling Fiber Dressing, 4x5 in (silver alginate) Discharge Instruction: Apply silver  alginate to wound bed as instructed Secondary Dressing ABD Pad, 5x9 Discharge Instruction: Apply over primary dressing as directed. Woven Gauze Sponge, Non-Sterile 4x4 in Discharge Instruction: Apply over primary dressing as directed. Secured With Compression Wrap Compression Stockings Facilities manager) Signed: 06/19/2022 5:29:08 PM By: Karie Schwalbe RN Entered By: Karie Schwalbe on 06/19/2022 16:14:55 -------------------------------------------------------------------------------- Vitals Details Patient Name: Date of Service: Lori Kennedy R. 06/19/2022 3:30 PM Medical Record Number: [id number] Patient Account Number: [id number] Date of Birth/Sex: Treating RN: [birth year] (46 y.o. Roselee Nova, Jamie Primary Care Binyomin Brann: Gwinda Passe Other Clinician: Referring Adayah Arocho: Treating Aelyn Stanaland/Extender: Sharyl Nimrod in Treatment: 3 Vital Signs Time Taken: 16:00 Temperature (F): 98.3 Height (in): 66 Pulse (bpm): 121 Weight (lbs): 197 Respiratory Rate (breaths/min): 18 Body Mass Index (BMI): 31.8 Blood Pressure (mmHg): 109/69 Reference Range: 80 - 120 mg / dl Electronic Signature(s) Signed: 06/23/2022 4:38:59 PM By: Tommie Ard RN Entered By: Tommie Ard on 06/19/2022 16:09:01

## 2022-06-23 NOTE — Progress Notes (Signed)
PODIATRY PROGRESS NOTE Patient Name: [name]  DOB Jun 21, 1976 DOA 06/20/2022  Hospital Day: 4  Assessment:  46 y.o. female with PMHx significant for GERD, Anemia, chronic foot wound and sacral abscess with Sepsis 2/2 L foot wound infection that has recently worsened. MRI without osteomyelitis or abscess Left foot.   Now POD 1 s/p Left foot wound debridement, puraply graft application, progressing well postop  AF, VSS  Wound Cultures: NGTD, pending  Imaging:  XR Foot Left 9/26: Deep dorsal lateral forefoot soft tissue ulceration without radiographic evidence of osteomyelitis. No evidence of tracking soft tissue gas  MRI Left foot WO Contrast 9/26: 1.  No evidence of osteomyelitis.  2. Marked skin thickening and subcutaneous soft tissue edema about dorsum of the foot without evidence of drainable fluid collection or abscess on this noncontrast enhanced examination.  Plan:  -POD 2 s/p Left foot wound debridement and application of graft progressing as expected post op - Appreciate ID recs, getting dose of long duration IV abx prior to DC, then Augmentin x 1 mo thereafter  - Anticoagulation: Per primary - Wound care: Changed dressing, plan for xerform and sterile saline wet to dry dressing changes on outpatient bases. Next change per patient Sunday. Then to wound care center Tuesday. - WB status: WBAT to the left foot in post op shoe -Stable for discharge from podiatry standpoint  - Follow up with wound care center or myself if wound center feel not able to manage her wound going forward         Corinna Gab, DPM [facility]    Subjective:  Patient seen bedside for dsg change. All questions answered. She has follow up apt in wound care center arranged for next week. Aware of dressing care for home. States pain is not as bad as pre op. No concerns. Appreciative of care in hospital.   Objective:   Vitals:   06/23/22 1112 06/23/22 1421  BP: 114/62 119/67   Pulse: 77 90  Resp: 16 16  Temp: 98.3 F (36.8 C) 98.9 F (37.2 C)  SpO2: 99% 99%       Latest Ref Rng & Units 06/23/2022    5:15 AM 06/22/2022    4:57 AM 06/21/2022   12:34 PM  CBC  WBC 4.0 - 10.5 K/uL 7.2  6.6    Hemoglobin 12.0 - 15.0 g/dL 7.0  7.3  8.0   Hematocrit 36.0 - 46.0 % 22.3  22.4  24.4   Platelets 150 - 400 K/uL 74  72         Latest Ref Rng & Units 06/23/2022    5:15 AM 06/22/2022    4:57 AM 06/21/2022    4:39 AM  BMP  Glucose 70 - 99 mg/dL 77  81  75   BUN 6 - 20 mg/dL 8  8  11    Creatinine 0.44 - 1.00 mg/dL  2.63  7.85   Sodium 135 - 145 mmol/L 136  136  136   Potassium 3.5 - 5.1 mmol/L 3.7  3.7  4.1   Chloride 98 - 111 mmol/L 112  111  110   CO2 22 - 32 mmol/L 19  19  19    Calcium 8.9 - 10.3 mg/dL 8.1  8.1  8.2     General: AAOx3, NAD  Lower Extremity Exam Physical Exam: Lower Extremity Exam Vasc:     R - PT palpable, DP palpable. Cap refill < 3 sec to digits  L - PT palpable, DP palpable. Cap refill <3 sec to digits   Derm:    R - Normal temp/texture/turgor with no open lesion or clinical signs of infection                L - Dressing changed, wound with clean granular tissue base, mild serous drainage in dressing but no residual necrotic tissue fibrotic or purulence. Mild marginal maceration.       MSK:     R -  No gross deformities. Compartments soft, non-tender, compressible               L -  decreased pitting edema right foot, decreased tender to palpation.    Neuro:   R - Gross sensation intact. Gross motor function intact                         L - Gross sensation intact. Gross motor function intact   Radiology:  Results reviewed. See assessment for pertinent imaging results

## 2022-06-23 NOTE — Discharge Summary (Signed)
Physician Discharge Summary  [name] LFY:[id number] DOB: 04/09/1976 DOA: 06/20/2022  PCP: Kerin Perna, NP  Admit date: 06/20/2022 Discharge date: 06/23/2022 Admitted From: Home Disposition: Home Recommendations for Outpatient Follow-up:  Follow up with PCP in 1 to 2 weeks Outpatient follow-up with podiatry as below ID to arrange outpatient follow-up Outpatient follow-up at wound clinic for wound care Other follow-ups with oncology and GI as previously planned Check CMP and CBC in 1 week Please follow up on the following pending results: Final result on surgical tissue culture  Home Health: None Equipment/Devices: None   Discharge Condition: Stable CODE STATUS: Full code  Follow-up Information     Standiford, Nena Alexander, DPM. Schedule an appointment as soon as possible for a visit in 1 week(s).   Specialty: Podiatry Why: Call to make appointment to be seen 1 week from discharge Contact information: [place] [address] [phone]         Kerin Perna, NP. Schedule an appointment as soon as possible for a visit in 1 week(s).   Specialty: Internal Medicine Contact information: [id number] [place] [address] [facility] course 46 year old F with PMH of chronic left foot wound followed at wound clinic, alcoholic hepatitis, GERD, IDA, tobacco use disorder presenting with worsening left foot wound despite different oral antibiotics and follow-up at wound care clinic, and admitted for infected left foot wound, AKI, hyponatremia and lactic acidosis.  Left foot x-ray with deep dorsal lateral forefoot soft tissue ulceration without radiographic evidence of osteomyelitis.  ESR elevated to 110.  CRP 1.5.  Patient was started on broad-spectrum antibiotics and IV fluid and admitted.  Podiatry consulted.   Patient underwent I&D debridement of dorsal foot ulcers and application of  skin substitute graft on 9/27. Tissue culture obtained and sent.  Reportedly no abscess per podiatry.  Tissue culture NGTD.  Fungal pain and culture pending.  ID consulted.  She received a dose of oritavancin for MRSA coverage and discharged on p.o. Augmentin for total of 4 weeks postoperatively.   Patient to follow-up with podiatry as above.  She also have an appointment with ID on 06/28/2022 at 11 AM.  In regards to alcoholic hepatitis, chlorthalidone discontinued and she was started on p.o. Lasix.  She will continue Aldactone.  Give prescription for lactulose as well.  She has upcoming appointment with GI.   See individual problem list below for more.   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  Significantly elevated ESR with mild CRP elevation but no leukocytosis or fever.  Blood and surgical tissue cultures NGTD.  X-ray and MRI did not show osteomyelitis.  CRP 1.5 (6.0 about a month ago).  A1c 4.2% but after blood transfusion. -S/p I&D debridement with graft placement by podiatry on 9/24.  Tissue cultures  NGTD. -Received oritavancin and discharged on p.o. Augmentin for 4 weeks -Outpatient follow-up with podiatry and ID -Dressing change every 3 days per podiatry.  Patient is followed by wound clinic -Now cardiac database reviewed.  Gave Rx for oxycodone.  Discussed precaution -Follow surgical fungal  stain and culture   AKI: Likely prerenal from nausea and vomiting.  She is also on hydrocortisone and Aldactone.  Resolving. Metabolic acidosis: Likely due to AKI and IV fluid. Recent Labs    04/28/22 0413 04/29/22 0306 05/01/22 8413 05/02/22 0548 05/03/22 0709 05/16/22 1633 06/20/22 1436 06/21/22 0439 06/22/22 0457 06/23/22 0515  BUN 22* 21* 22* 27* 25* '16 12 11 8 8  ' CREATININE 1.69* 1.63* 1.82* 1.73* 1.87* 1.25* 1.79* 1.65* 1.49* 1.35*  -Recheck renal function at follow-up   Hyponatremia: Likely hypovolemic but she is also on Bactrim, Aldactone and Hygroton.  Resolved. -Changed hydrocodone to Lasix.   Iron deficiency anemia superimposed on anemia of chronic disease: Drop in Hgb likely dilutional from IV fluid.  No evidence of bleeding.  Transfused a total of 2 units and Hgb improved to 8.7. Recent Labs    05/01/22 0643 05/02/22 0548 05/03/22 0709 05/16/22 1633 06/20/22 1436 06/21/22 0439 06/21/22 1234 06/22/22 0457 06/23/22 0515 06/23/22 1656  HGB 9.2* 7.9* 8.3* 10.0* 9.1* 6.9* 8.0* 7.3* 7.0* 8.7*  -Recheck CBC at follow-up  Lactic acidosis: Likely due to dehydration versus sepsis.  Resolved.   Tobacco use disorder -Encourage smoking cessation -Nicotine patch   EtOH hepatitis/transaminitis/hyperbilirubinemia: Mild elevated AST and bilirubin.  CK within normal.  Reportedly quit drinking 3 months ago.  Improving. -Recheck LFT at follow-up   History of hypertension/hypotension -Changed Hygroton to Lasix -Continue home Aldactone   Hypomagnesemia: Resolved.           Vital signs Vitals:   06/23/22 0538 06/23/22 1041 06/23/22 1112 06/23/22 1421  BP: 108/75 113/64 114/62 119/67  Pulse: 97 85 77 90  Temp: 98.6 F (37 C) 98.1 F (36.7 C) 98.3 F (36.8 C) 98.9 F (37.2 C)  Resp: '18 16 16 16  ' Height:      Weight:      SpO2: 98% 100% 99% 99%  TempSrc: Oral  Oral Oral  BMI (Calculated):         Discharge exam  GENERAL: No apparent distress.   Nontoxic. HEENT: MMM.  Vision and hearing grossly intact.  NECK: Supple.  No apparent JVD.  RESP:  No IWOB.  Fair aeration bilaterally. CVS:  RRR. Heart sounds normal.  ABD/GI/GU: BS+. Abd soft, NTND.  MSK/EXT:  Moves extremities.  Bulky dressing over left foot.  Slight edema proximally. SKIN: Bulky dressing over left foot. NEURO: Awake and alert. Oriented appropriately.  No apparent focal neuro deficit. PSYCH: Calm. Normal affect.   Discharge Instructions Discharge Instructions     Call MD for:  extreme fatigue   Complete by: As directed    Call MD for:  persistant dizziness or light-headedness   Complete by: As directed    Call MD for:  severe uncontrolled pain   Complete by: As directed    Call MD for:  temperature >100.4   Complete by: As directed    Diet - low sodium heart healthy   Complete by: As directed    Discharge instructions   Complete by: As directed    It has been a pleasure taking care of you!  You were hospitalized due to left foot wound for which you have been treated surgically and medically with antibiotics.  We are discharging you on more antibiotic to complete treatment course.  Follow the postoperative instruction you were provided by the podiatry.  Infectious disease doctors will arrange outpatient follow-up as well.  Please review your new medication list and the directions on your medications before you take them.  Be aware that oxycodone is addictive and can cause problems including drowsiness, sedation, constipation, impaired judgment, respiratory depression that could potentially lead to death and impaired balance that could increase risk of fall.   We do not recommend driving, operating machinery or other activity that requires similar mental and physical engagement.  Take only as needed for significant pain.    Take care,   Discharge wound care:   Complete by: As directed    Wound care  Every 3 days     Comments: Apply saline wet  to dry gauze  dressing covered with kerlix roll guaze and ace wrap to left foot, take picture in chart when completing dressing change.   Increase activity slowly   Complete by: As directed       Allergies as of 06/23/2022       Reactions   Tape Rash   Clear tape        Medication List     STOP taking these medications    chlorthalidone 25 MG tablet Commonly known as: HYGROTON   prednisoLONE 15 MG/5ML Soln Commonly known as: PRELONE   sucralfate 1 GM/10ML suspension Commonly known as: CARAFATE   sulfamethoxazole-trimethoprim 800-160 MG tablet Commonly known as: BACTRIM DS       TAKE these medications    amoxicillin-clavulanate 875-125 MG tablet Commonly known as: AUGMENTIN Take 1 tablet by mouth 2 (two) times daily for 27 days.   B-12 PO Take 1 tablet by mouth daily.   busPIRone 15 MG tablet Commonly known as: BUSPAR Take 15 mg by mouth daily as needed (anxiety).   ferrous sulfate 325 (65 FE) MG tablet Take 1 tablet (325 mg total) by mouth daily with breakfast.   folic acid 1 MG tablet Commonly known as: FOLVITE Take 1 tablet (1 mg total) by mouth daily.   furosemide 20 MG tablet Commonly known as: Lasix Take 1 tablet (20 mg total) by mouth daily.   lactulose 10 GM/15ML solution Commonly known as: CHRONULAC Take 30 mLs (20 g total) by mouth 3 (three) times daily as needed for mild constipation or moderate constipation.   loperamide 2 MG capsule Commonly known as: IMODIUM Take 1 capsule (2 mg total) by mouth as needed for diarrhea or loose stools.   midodrine 10 MG tablet Commonly known as: PROAMATINE Take 1 tablet (10 mg total) by mouth 3 (three) times daily with meals.   multivitamin tablet Take 1 tablet by mouth daily.   ondansetron 8 MG disintegrating tablet Commonly known as: ZOFRAN-ODT Take 1 tablet (8 mg total) by mouth every 8 (eight) hours as needed for nausea or vomiting.   oxyCODONE 5 MG immediate release tablet Commonly known as:  Roxicodone Take 1 tablet (5 mg total) by mouth every 8 (eight) hours as needed for up to 5 days for moderate pain or severe pain.   pantoprazole 40 MG tablet Commonly known as: PROTONIX Take 1 tablet (40 mg total) by mouth 2 (two) times daily before a meal.   sertraline 50 MG tablet Commonly known as: ZOLOFT Take 50 mg by mouth daily.   spironolactone 50 MG tablet Commonly known as: ALDACTONE Take 1 tablet (50 mg total) by mouth daily.  Discharge Care Instructions  (From admission, onward)           Start     Ordered   06/23/22 0000  Discharge wound care:       Comments: Wound care  Every 3 days     Comments: Apply saline wet  to dry gauze dressing covered with kerlix roll guaze and ace wrap to left foot, take picture in chart when completing dressing change.   06/23/22 1315            Consultations: Podiatry Infectious disease  Procedures/Studies: 9/27-I&D debridement of left foot ulcers   MR FOOT LEFT WO CONTRAST  Result Date: 06/20/2022 CLINICAL DATA:  Foot swelling, osteomyelitis suspected. Evaluate for abscess. EXAM: MRI OF THE LEFT FOOT WITHOUT CONTRAST TECHNIQUE: Multiplanar, multisequence MR imaging of the left foot was performed. No intravenous contrast was administered. COMPARISON:  Radiographs dated June 20, 2022 FINDINGS: Bones/Joint/Cartilage Marrow signal is within normal limits. No evidence of marrow edema to suggest osteomyelitis. No appreciable joint effusion or significant arthropathy. Ligaments Lisfranc and collateral ligaments are intact. Muscles and Tendons Plantar muscles are normal in signal. No intramuscular collection or abscess. The tendons of the flexor and extensor compartments are intact, evaluation of tendons is however somewhat limited due to motion. Soft tissues Skin irregularity and marked subcutaneous soft tissue edema about the dorsum of the foot. No fluid collection or abscess on this noncontrast enhanced  examination. IMPRESSION: 1.  No evidence of osteomyelitis. 2. Marked skin thickening and subcutaneous soft tissue edema about dorsum of the foot without evidence of drainable fluid collection or abscess on this noncontrast enhanced examination. Electronically Signed   By: Keane Police D.O.   On: 06/20/2022 21:28   DG Foot Complete Left  Result Date: 06/20/2022 CLINICAL DATA:  Left foot wound. EXAM: LEFT FOOT - COMPLETE 3+ VIEW COMPARISON:  None Available. FINDINGS: Deep dorsal lateral forefoot soft tissue ulceration with prominent soft tissue swelling. No bony destruction or periosteal reaction. No acute fracture or dislocation. Joint spaces are preserved. Bone mineralization is normal. IMPRESSION: 1. Deep dorsal lateral forefoot soft tissue ulceration without radiographic evidence of osteomyelitis. Electronically Signed   By: Titus Dubin M.D.   On: 06/20/2022 14:36   VAS [place] LOWER EXTREMITY VENOUS (DVT)  Result Date: 06/05/2022  Lower Venous DVT Study Patient Name:  MAVERICK PATMAN  Date of Exam:   06/05/2022 Medical Rec #: [id number]         Accession #:    [id number] Date of Birth: June 17, 1976        Patient Gender: F Patient Age:   64 years Exam Location:  [facility] Procedure:      VAS [place] LOWER EXTREMITY VENOUS (DVT) Referring Phys: Mertie Moores --------------------------------------------------------------------------------  Indications: Edema. Other Indications: Patient complains of bilateral calf swelling for two months.                    She denies SOB. Risk Factors: None identified. Performing Technologist: Wilkie Aye RVT  Examination Guidelines: A complete evaluation includes B-mode imaging, spectral Doppler, color Doppler, and power Doppler as needed of all accessible portions of each vessel. Bilateral testing is considered an integral part of a complete examination. Limited examinations for reoccurring indications may be performed as noted. The reflux portion of the exam is performed with the  patient in reverse Trendelenburg.  +---------+---------------+---------+-----------+----------+--------------+ RIGHT    CompressibilityPhasicitySpontaneityPropertiesThrombus Aging +---------+---------------+---------+-----------+----------+--------------+ CFV      Full  Yes      Yes                                 +---------+---------------+---------+-----------+----------+--------------+ SFJ      Full           Yes      Yes                                 +---------+---------------+---------+-----------+----------+--------------+ FV Prox  Full           Yes      Yes                                 +---------+---------------+---------+-----------+----------+--------------+ FV Mid   Full           Yes      Yes                                 +---------+---------------+---------+-----------+----------+--------------+ FV DistalFull           Yes      Yes                                 +---------+---------------+---------+-----------+----------+--------------+ PFV      Full           Yes      Yes                                 +---------+---------------+---------+-----------+----------+--------------+ POP      Full           Yes      Yes                                 +---------+---------------+---------+-----------+----------+--------------+ PTV      Full           Yes      Yes                                 +---------+---------------+---------+-----------+----------+--------------+ PERO     Full           Yes      Yes                                 +---------+---------------+---------+-----------+----------+--------------+ Gastroc  Full                                                        +---------+---------------+---------+-----------+----------+--------------+ GSV      Full           Yes      Yes                                 +---------+---------------+---------+-----------+----------+--------------+    +---------+---------------+---------+-----------+----------+--------------+  LEFT     CompressibilityPhasicitySpontaneityPropertiesThrombus Aging +---------+---------------+---------+-----------+----------+--------------+ CFV      Full           Yes      Yes                                 +---------+---------------+---------+-----------+----------+--------------+ SFJ      Full           Yes      Yes                                 +---------+---------------+---------+-----------+----------+--------------+ FV Prox  Full           Yes      Yes                                 +---------+---------------+---------+-----------+----------+--------------+ FV Mid   Full           Yes      Yes                                 +---------+---------------+---------+-----------+----------+--------------+ FV DistalFull           Yes      Yes                                 +---------+---------------+---------+-----------+----------+--------------+ PFV      Full           Yes      Yes                                 +---------+---------------+---------+-----------+----------+--------------+ POP      Full           Yes      Yes                                 +---------+---------------+---------+-----------+----------+--------------+ PTV      Full           Yes      Yes                                 +---------+---------------+---------+-----------+----------+--------------+ PERO     Full           Yes      Yes                                 +---------+---------------+---------+-----------+----------+--------------+ Gastroc  Full                                                        +---------+---------------+---------+-----------+----------+--------------+ GSV      Full           Yes      Yes                                 +---------+---------------+---------+-----------+----------+--------------+  Summary: RIGHT: - No evidence of deep vein  thrombosis in the lower extremity. No indirect evidence of obstruction proximal to the inguinal ligament. - No cystic structure found in the popliteal fossa. Interstitial fluid  LEFT: - No evidence of deep vein thrombosis in the lower extremity. No indirect evidence of obstruction proximal to the inguinal ligament. - No cystic structure found in the popliteal fossa. Interstitial fluid.  *See table(s) above for measurements and observations. Electronically signed by Larae Grooms MD on 06/05/2022 at 4:09:15 PM.    Final        The results of significant diagnostics from this hospitalization (including imaging, microbiology, ancillary and laboratory) are listed below for reference.     Microbiology: Recent Results (from the past 240 hour(s))  Blood culture (routine x 2)     Status: None (Preliminary result)   Collection Time: 06/20/22  2:36 PM   Specimen: BLOOD  Result Value Ref Range Status   Specimen Description   Final    BLOOD RIGHT ANTECUBITAL Performed at [facility] [address], [place], [place] [address]    Special Requests   Final    BOTTLES DRAWN AEROBIC AND ANAEROBIC Blood Culture adequate volume Performed at [facility] [address] [place], [place] [address]    Culture   Final    NO GROWTH 3 DAYS Performed at [facility], [place] [address], [place], [place] [address]    Report Status PENDING  Incomplete  Blood culture (routine x 2)     Status: None (Preliminary result)   Collection Time: 06/20/22  6:11 PM   Specimen: BLOOD  Result Value Ref Range Status   Specimen Description   Final    BLOOD BLOOD RIGHT FOREARM Performed at [facility] [address], [place], [place] [address]    Special Requests   Final    BOTTLES DRAWN AEROBIC AND ANAEROBIC Blood Culture results may not be optimal due to an inadequate volume of blood received in culture bottles Performed at [facility] [address], [place],  [address]    Culture   Final    NO GROWTH 3 DAYS Performed at [facility], [place] [address], [place],  [address]    Report Status PENDING  Incomplete  Resp Panel by RT-PCR (Flu A&B, Covid) Anterior Nasal Swab     Status: None   Collection Time: 06/20/22  6:44 PM   Specimen: Anterior Nasal Swab  Result Value Ref Range Status   SARS Coronavirus 2 by RT PCR NEGATIVE NEGATIVE Final    Comment: (NOTE) SARS-CoV-2 target nucleic acids are NOT DETECTED.  The SARS-CoV-2 RNA is generally detectable in upper respiratory specimens during the acute phase of infection. The lowest concentration of SARS-CoV-2 viral copies this assay can detect is 138 copies/mL. A negative result does not preclude SARS-Cov-2 infection and should not be used as the sole basis for treatment or other patient management decisions. A negative result may occur with  improper specimen collection/handling, submission of specimen other than nasopharyngeal swab, presence of viral mutation(s) within the areas targeted by this assay, and inadequate number of viral copies(<138 copies/mL). A negative result must be combined with clinical observations, patient history, and epidemiological information. The expected result is Negative.  Fact Sheet for Patients:  EntrepreneurPulse.com.au  Fact Sheet for Healthcare Providers:  IncredibleEmployment.be  This test is no t yet approved or cleared by the [place] FDA and  has  been authorized for detection and/or diagnosis of SARS-CoV-2 by FDA under an Emergency Use Authorization (EUA). This EUA will remain  in effect (meaning this test can be used) for the duration of the COVID-19 declaration under Section 564(b)(1) of the Act, 21 U.S.C.section 360bbb-3(b)(1), unless the authorization is terminated  or revoked sooner.       Influenza A by PCR NEGATIVE NEGATIVE Final   Influenza B by PCR  NEGATIVE NEGATIVE Final    Comment: (NOTE) The Xpert Xpress SARS-CoV-2/FLU/RSV plus assay is intended as an aid in the diagnosis of influenza from Nasopharyngeal swab specimens and should not be used as a sole basis for treatment. Nasal washings and aspirates are unacceptable for Xpert Xpress SARS-CoV-2/FLU/RSV testing.  Fact Sheet for Patients: EntrepreneurPulse.com.au  Fact Sheet for Healthcare Providers: IncredibleEmployment.be  This test is not yet approved or cleared by the [place] FDA and has been authorized for detection and/or diagnosis of SARS-CoV-2 by FDA under an Emergency Use Authorization (EUA). This EUA will remain in effect (meaning this test can be used) for the duration of the COVID-19 declaration under Section 564(b)(1) of the Act, 21 U.S.C. section 360bbb-3(b)(1), unless the authorization is terminated or revoked.  Performed at [facility], [place] [address], [place], [place] [address]   MRSA Next Gen by PCR, Nasal     Status: None   Collection Time: 06/20/22 11:43 PM   Specimen: Nasal Mucosa; Nasal Swab  Result Value Ref Range Status   MRSA by PCR Next Gen NOT DETECTED NOT DETECTED Final    Comment: (NOTE) The GeneXpert MRSA Assay (FDA approved for NASAL specimens only), is one component of a comprehensive MRSA colonization surveillance program. It is not intended to diagnose MRSA infection nor to guide or monitor treatment for MRSA infections. Test performance is not FDA approved in patients less than 13 years old. Performed at [facility], [place] [address], [place], [place] [address]   Aerobic/Anaerobic Culture w Gram Stain (surgical/deep wound)     Status: None (Preliminary result)   Collection Time: 06/21/22  3:06 PM   Specimen: Foot, Left; Abscess  Result Value Ref Range Status   Specimen Description   Final    ABSCESS LEFT FOOT PRE LAVAGE Performed at [facility] [address], [place], [place] [address]    Special Requests   Final    NONE Performed at [facility], [place] [address] [place], [place] [address]    Gram Stain NO WBC SEEN NO ORGANISMS SEEN   Final   Culture   Final    NO GROWTH 2 DAYS Performed at [facility], [place] [address] [place], [place] [address]    Report Status PENDING  Incomplete  Aerobic/Anaerobic Culture w Gram Stain (surgical/deep wound)     Status: None (Preliminary result)   Collection Time: 06/21/22  3:25 PM   Specimen: Foot, Left; Abscess  Result Value Ref Range Status   Specimen Description   Final    ABSCESS LEFT FOOT POST LAVAGE Performed at [facility] [address] [place], [place] [address]    Special Requests   Final    NONE Performed at [facility], [place] [address] [place], [place] [address]    Gram Stain NO WBC SEEN NO ORGANISMS SEEN   Final   Culture   Final    NO GROWTH 2 DAYS Performed at [facility], [place] [address], [place], [place]  [address]    Report Status PENDING  Incomplete     Labs:  CBC: Recent Labs  Lab 06/20/22 1436 06/21/22 0439 06/21/22 1234 06/22/22 0457 06/23/22 0515 06/23/22 1656  WBC 9.6 6.9  --  6.6 7.2  --   NEUTROABS 7.1 4.5  --   --   --   --   HGB 9.1* 6.9* 8.0* 7.3* 7.0* 8.7*  HCT 28.0* 21.0* 24.4* 22.4* 22.3* 27.0*  MCV 97.9 100.5*  --  98.2 100.9*  --   PLT 127* 81*  --  72* 74*  --    BMP &GFR Recent Labs  Lab 06/20/22 1436 06/21/22 0439 06/22/22 0457 06/23/22 0515  NA 132* 136 136 136  K 3.7 4.1 3.7 3.7  CL 105 110 111 112*  CO2 18* 19* 19* 19*  GLUCOSE 89 75 81 77  BUN '12 11 8 8  ' CREATININE 1.79* 1.65* 1.49* 1.35*  CALCIUM 8.5* 8.2* 8.1* 8.1*  MG 1.3* 1.3* 1.6* 2.0  PHOS  --   --  3.3 2.7   Estimated Creatinine Clearance: 57.6 mL/min (A) (by C-G formula based on SCr of 1.35 mg/dL (H)). Liver & Pancreas: Recent Labs  Lab 06/20/22 1436  06/21/22 0439 06/22/22 0457 06/23/22 0515  AST 105* 73* 65*  --   ALT 34 26 24  --   ALKPHOS 167* 137* 139*  --   BILITOT 3.0* 2.1* 2.4*  --   PROT 7.6 6.0* 5.8*  --   ALBUMIN 2.3* 1.9* 1.9* 1.8*   No results for input(s): "LIPASE", "AMYLASE" in the last 168 hours. No results for input(s): "AMMONIA" in the last 168 hours. Diabetic: Recent Labs    06/22/22 0457  HGBA1C 4.2*   No results for input(s): "GLUCAP" in the last 168 hours. Cardiac Enzymes: Recent Labs  Lab 06/20/22 2219  CKTOTAL 21*   No results for input(s): "PROBNP" in the last 8760 hours. Coagulation Profile: Recent Labs  Lab 06/20/22 2219  INR 1.7*   Thyroid Function Tests: Recent Labs    06/22/22 0457  TSH 2.547   Lipid Profile: No results for input(s): "CHOL", "HDL", "LDLCALC", "TRIG", "CHOLHDL", "LDLDIRECT" in the last 72 hours. Anemia Panel: Recent Labs    06/22/22 0457  VITAMINB12 1,737*  FOLATE 9.5  FERRITIN 687*  TIBC NOT CALCULATED  IRON 62  RETICCTPCT 1.5   Urine analysis:    Component Value Date/Time   COLORURINE YELLOW 06/21/2022 1853   APPEARANCEUR CLEAR 06/21/2022 1853   LABSPEC 1.010 06/21/2022 1853   PHURINE 6.0 06/21/2022 1853   GLUCOSEU NEGATIVE 06/21/2022 1853   HGBUR SMALL (A) 06/21/2022 1853   BILIRUBINUR NEGATIVE 06/21/2022 1853   KETONESUR NEGATIVE 06/21/2022 1853   PROTEINUR NEGATIVE 06/21/2022 1853   UROBILINOGEN 0.2 01/09/2013 2111   NITRITE NEGATIVE 06/21/2022 1853   LEUKOCYTESUR NEGATIVE 06/21/2022 1853   Sepsis Labs: Invalid input(s): "PROCALCITONIN", "LACTICIDVEN"   SIGNED:  Mercy Riding, MD  [facility] 06/23/2022, 9:44 PM

## 2022-06-24 LAB — TYPE AND SCREEN
ABO/RH(D): B POS
Antibody Screen: NEGATIVE
Unit division: 0
Unit division: 0

## 2022-06-24 LAB — BPAM RBC
Blood Product Expiration Date: 202310032359
Blood Product Expiration Date: 202310132359
ISSUE DATE / TIME: 202309270819
ISSUE DATE / TIME: 202309291048
Unit Type and Rh: 1700
Unit Type and Rh: 7300

## 2022-06-25 LAB — CULTURE, BLOOD (ROUTINE X 2)
Culture: NO GROWTH
Culture: NO GROWTH
Special Requests: ADEQUATE

## 2022-06-26 ENCOUNTER — Telehealth: Payer: Self-pay

## 2022-06-26 LAB — AEROBIC/ANAEROBIC CULTURE W GRAM STAIN (SURGICAL/DEEP WOUND)
Culture: NO GROWTH
Culture: NO GROWTH
Gram Stain: NONE SEEN
Gram Stain: NONE SEEN

## 2022-06-26 NOTE — Telephone Encounter (Signed)
It looks like this medication is filled/managed by her Cardiology team. Is it possible to request Dr. Cathie Olden?

## 2022-06-26 NOTE — Telephone Encounter (Signed)
Transition Care Management Follow-up Telephone Call Date of discharge and from where: 06/23/2022, [facility] How have you been since you were released from the hospital? She is doing okay.  Has multiple upcoming appointments.  Any questions or concerns? Yes- needs refill of midodrine.  She stated she was on gabapentin in the past but it is no longer on her med list. She plans to check with podiatry about this.   Items Reviewed: Did the pt receive and understand the discharge instructions provided? Yes  Medications obtained and verified? Yes - She has all of her medications but needs a refill of midodrine Other? No  Any new allergies since your discharge? No  Dietary orders reviewed? No Do you have support at home? Yes   Home Care and Equipment/Supplies: Were home health services ordered? no If so, what is the name of the agency? N/a  Has the agency set up a time to come to the patient's home? not applicable Were any new equipment or medical supplies ordered?  No What is the name of the medical supply agency? N/a Were you able to get the supplies/equipment? not applicable Do you have any questions related to the use of the equipment or supplies? No  Functional Questionnaire: (I = Independent and D = Dependent) ADLs: ambulating with a cane. Independent with personal care.  She said that she has changed her foot dressing already and understands that she is supposed to change it every 3 days.   Follow up appointments reviewed:  PCP Hospital f/u appt confirmed? Yes  Scheduled to see Juluis Mire. on 07/20/2022.  [facility] f/u appt confirmed? Yes  Scheduled to see - wound care - 06/27/2022, [facility]- 06/28/2022, oncology-07/12/2022, GI- 07/18/2022  Are transportation arrangements needed? No  If their condition worsens, is the pt aware to call PCP or go to the Emergency Dept.? Yes Was the patient provided with contact information for the PCP's office or ED? Yes Was to pt  encouraged to call back with questions or concerns? Yes

## 2022-06-27 ENCOUNTER — Encounter (HOSPITAL_BASED_OUTPATIENT_CLINIC_OR_DEPARTMENT_OTHER): Payer: Self-pay | Attending: General Surgery | Admitting: General Surgery

## 2022-06-27 DIAGNOSIS — I129 Hypertensive chronic kidney disease with stage 1 through stage 4 chronic kidney disease, or unspecified chronic kidney disease: Secondary | ICD-10-CM | POA: Insufficient documentation

## 2022-06-27 DIAGNOSIS — L97522 Non-pressure chronic ulcer of other part of left foot with fat layer exposed: Secondary | ICD-10-CM | POA: Insufficient documentation

## 2022-06-27 DIAGNOSIS — K7010 Alcoholic hepatitis without ascites: Secondary | ICD-10-CM | POA: Insufficient documentation

## 2022-06-27 DIAGNOSIS — D631 Anemia in chronic kidney disease: Secondary | ICD-10-CM | POA: Insufficient documentation

## 2022-06-27 DIAGNOSIS — N189 Chronic kidney disease, unspecified: Secondary | ICD-10-CM | POA: Insufficient documentation

## 2022-06-27 DIAGNOSIS — L98412 Non-pressure chronic ulcer of buttock with fat layer exposed: Secondary | ICD-10-CM | POA: Insufficient documentation

## 2022-06-27 NOTE — Telephone Encounter (Signed)
Call placed to patient [phone] to inform her that Dr Cathie Olden will need to order the midodrine but her voicemail was full.  Unable to leave a message

## 2022-06-27 NOTE — Progress Notes (Signed)
TESS, POTTS ([id number]) Visit Report for 06/27/2022 Arrival Information Details Patient Name: Date of Service: [name], [name] 06/27/2022 2:45 PM Medical Record Number: [id number] Patient Account Number: [id number] Date of Birth/Sex: Treating RN: Jan 16, 1976 (46 y.o. Elam Dutch Primary Care Provider: Juluis Mire Other Clinician: Referring Provider: Treating Provider/Extender: Patrici Ranks in Treatment: 4 Visit Information History Since Last Visit Added or deleted any medications: Yes Patient Arrived: Cane Any new allergies or adverse reactions: No Arrival Time: 15:05 Had a fall or experienced change in No Accompanied By: self activities of daily living that may affect Transfer Assistance: None risk of falls: Patient Identification Verified: Yes Signs or symptoms of abuse/neglect since No Secondary Verification Process Completed: Yes last visito Patient Requires Transmission-Based Precautions: No Hospitalized since last visit: Yes Patient Has Alerts: No Implantable device outside of the clinic No excluding cellular tissue based products placed in the center since last visit: Has Dressing in Place as Prescribed: Yes Has Footwear/Offloading in Place as Yes Prescribed: Left: Surgical Shoe with Pressure Relief Insole Pain Present Now: Yes Electronic Signature(s) Signed: 06/27/2022 5:08:49 PM By: Baruch Gouty RN, BSN Entered By: Baruch Gouty on 06/27/2022 15:07:11 -------------------------------------------------------------------------------- Encounter Discharge Information Details Patient Name: Date of Service: Lori Hew. 06/27/2022 2:45 PM Medical Record Number: [id number] Patient Account Number: [id number] Date of Birth/Sex: Treating RN: 04/02/1976 (46 y.o. Elam Dutch Primary Care Provider: Juluis Mire Other Clinician: Referring Provider: Treating Provider/Extender: Patrici Ranks in Treatment: 4 Encounter Discharge Information Items Post Procedure Vitals Discharge Condition: Stable Temperature (F): 98.6 Ambulatory Status: Cane Pulse (bpm): 121 Discharge Destination: Home Respiratory Rate (breaths/min): 18 Transportation: Private Auto Blood Pressure (mmHg): 131/85 Accompanied By: self Schedule Follow-up Appointment: Yes Clinical Summary of Care: Patient Declined Electronic Signature(s) Signed: 06/27/2022 5:08:49 PM By: Baruch Gouty RN, BSN Entered By: Baruch Gouty on 06/27/2022 15:56:11 -------------------------------------------------------------------------------- Lower Extremity Assessment Details Patient Name: Date of Service: Lori Hew. 06/27/2022 2:45 PM Medical Record Number: [id number] Patient Account Number: [id number] Date of Birth/Sex: Treating RN: 1975/10/07 (46 y.o. Elam Dutch Primary Care Provider: Juluis Mire Other Clinician: Referring Provider: Treating Provider/Extender: Patrici Ranks in Treatment: 4 Edema Assessment Assessed: [Left: No] [Right: Yes] E[Left: dema] [Right: :] Calf Left: Right: Point of Measurement: From Medial Instep 38.5 cm Ankle Left: Right: Point of Measurement: From Medial Instep 22 cm Vascular Assessment Pulses: Dorsalis Pedis Palpable: [Left:Yes] Electronic Signature(s) Signed: 06/27/2022 5:08:49 PM By: Baruch Gouty RN, BSN Entered By: Baruch Gouty on 06/27/2022 15:11:58 -------------------------------------------------------------------------------- Multi Wound Chart Details Patient Name: Date of Service: Lori Hew. 06/27/2022 2:45 PM Medical Record Number: [id number] Patient Account Number: [id number] Date of Birth/Sex: Treating RN: [birth year] (46 y.o. Elam Dutch Primary Care Provider: Juluis Mire Other Clinician: Referring Provider: Treating Provider/Extender: Patrici Ranks in Treatment: 4 Vital Signs Height(in): 32 Pulse(bpm): 121 Weight(lbs): 197 Blood Pressure(mmHg): 131/85 Body Mass Index(BMI): 31.8 Temperature(F): 98.6 Respiratory Rate(breaths/min): 18 Photos: [1:Left, Dorsal Foot] [2:Left Gluteal fold] [N/A:N/A N/A] Wound Location: [1:Shear/Friction] [2:Shear/Friction] [N/A:N/A] Wounding Event: [1:Trauma, Other] [2:Trauma, Other] [N/A:N/A] Primary Etiology: [1:Anemia, Lymphedema, Hypertension,] [2:Anemia, Lymphedema, Hypertension, N/A] Comorbid History: [1:Hypotension, Cirrhosis 05/20/2022] [2:Hypotension, Cirrhosis 03/20/2022] [N/A:N/A] Date Acquired: [1:4] [2:4] [N/A:N/A] Weeks of Treatment: [1:Open] [2:Open] [N/A:N/A] Wound Status: [1:No] [2:No] [N/A:N/A] Wound Recurrence: [1:3.5x5.4x0.3] [2:6.1x4.3x0.3] [N/A:N/A] Measurements L x W x D (cm) [1:14.844] [9:48.546] [N/A:N/A] A (cm) : rea [1:4.453] [2:6.18] [N/A:N/A] Volume (cm) : [1:-1999.60%] [2:18.50%] [N/A:N/A] % Reduction in A [  1:rea: -6171.80%] [2:81.20%] [N/A:N/A] % Reduction in Volume: [2:10] Starting Position 1 (o'clock): [2:9] Ending Position 1 (o'clock): [2:2.1] Maximum Distance 1 (cm): [1:No] [2:Yes] [N/A:N/A] Undermining: [1:Full Thickness Without Exposed] [2:Full Thickness Without Exposed] [N/A:N/A] Classification: [1:Support Structures Large] [2:Support Structures Medium] [N/A:N/A] Exudate A mount: [1:Serosanguineous] [2:Serosanguineous] [N/A:N/A] Exudate Type: [1:red, brown] [2:red, brown] [N/A:N/A] Exudate Color: [1:Distinct, outline attached] [2:Distinct, outline attached] [N/A:N/A] Wound Margin: [1:Large (67-100%)] [2:Large (67-100%)] [N/A:N/A] Granulation A mount: [1:Red] [2:Red, Pink] [N/A:N/A] Granulation Quality: [1:Small (1-33%)] [2:Small (1-33%)] [N/A:N/A] Necrotic A mount: [1:Fat Layer (Subcutaneous Tissue): Yes Fat Layer (Subcutaneous Tissue): Yes N/A] Exposed Structures: [1:Fascia: No Tendon: No Muscle: No Joint: No Bone: No None] [2:Fascia: No  Tendon: No Muscle: No Joint: No Bone: No Small (1-33%)] [N/A:N/A] Epithelialization: [1:N/A] [2:Debridement - Selective/Open Wound N/A] Debridement: Pre-procedure Verification/Time Out N/A [2:15:35] [N/A:N/A] Taken: [1:N/A] [2:Non-Viable Tissue] [N/A:N/A] Level: [1:N/A] [2:26.23] [N/A:N/A] Debridement A (sq cm): [1:rea N/A] [2:Curette] [N/A:N/A] Instrument: [1:N/A] [2:Minimum] [N/A:N/A] Bleeding: [1:N/A] [2:Pressure] [N/A:N/A] Hemostasis A chieved: [1:N/A] [2:3] [N/A:N/A] Procedural Pain: [1:N/A] [2:1] [N/A:N/A] Post Procedural Pain: [1:N/A] [2:Procedure was tolerated well] [N/A:N/A] Debridement Treatment Response: [1:N/A] [2:6.1x4.3x0.3] [N/A:N/A] Post Debridement Measurements L x W x D (cm) [1:N/A] [2:6.18] [N/A:N/A] Post Debridement Volume: (cm) [1:No Abnormalities Noted] [2:No Abnormalities Noted] [N/A:N/A] Periwound Skin Texture: [1:No Abnormalities Noted] [2:No Abnormalities Noted] [N/A:N/A] Periwound Skin Moisture: [1:No Abnormalities Noted] [2:No Abnormalities Noted] [N/A:N/A] Periwound Skin Color: [1:N/A] [2:No Abnormality] [N/A:N/A] Temperature: [1:N/A] [2:Yes] [N/A:N/A] Tenderness on Palpation: [1:N/A] [2:Debridement] [N/A:N/A] Treatment Notes Electronic Signature(s) Signed: 06/27/2022 3:49:30 PM By: Fredirick Maudlin MD FACS Signed: 06/27/2022 5:08:49 PM By: Baruch Gouty RN, BSN Entered By: Fredirick Maudlin on 06/27/2022 15:49:30 -------------------------------------------------------------------------------- [place] Details Patient Name: Date of Service: Lori Hawking R. 06/27/2022 2:45 PM Medical Record Number: [id number] Patient Account Number: [id number] Date of Birth/Sex: Treating RN: March 16, 1976 (46 y.o. Elam Dutch Primary Care Provider: Juluis Mire Other Clinician: Referring Provider: Treating Provider/Extender: Patrici Ranks in Treatment: 4 Multidisciplinary Care Plan reviewed with  physician Active Inactive Pressure Nursing Diagnoses: Knowledge deficit related to causes and risk factors for pressure ulcer development Potential for impaired tissue integrity related to pressure, friction, moisture, and shear Goals: Patient will remain free from development of additional pressure ulcers Date Initiated: 05/24/2022 Date Inactivated: 06/27/2022 Target Resolution Date: 06/30/2022 Goal Status: Met Patient/caregiver will verbalize risk factors for pressure ulcer development Date Initiated: 05/24/2022 Date Inactivated: 06/27/2022 Target Resolution Date: 06/30/2022 Goal Status: Met Patient/caregiver will verbalize understanding of pressure ulcer management Date Initiated: 06/27/2022 Target Resolution Date: 07/25/2022 Goal Status: Active Interventions: Assess: immobility, friction, shearing, incontinence upon admission and as needed Assess potential for pressure ulcer upon admission and as needed Notes: Wound/Skin Impairment Nursing Diagnoses: Impaired tissue integrity Knowledge deficit related to ulceration/compromised skin integrity Goals: Patient will demonstrate a reduced rate of smoking or cessation of smoking Date Initiated: 05/24/2022 Target Resolution Date: 07/25/2022 Goal Status: Active Patient/caregiver will verbalize understanding of skin care regimen Date Initiated: 05/24/2022 Target Resolution Date: 07/25/2022 Goal Status: Active Ulcer/skin breakdown will have a volume reduction of 50% by week 8 Date Initiated: 06/27/2022 Target Resolution Date: 07/25/2022 Goal Status: Active Interventions: Assess patient/caregiver ability to obtain necessary supplies Assess ulceration(s) every visit Treatment Activities: Skin care regimen initiated : 05/24/2022 Topical wound management initiated : 05/24/2022 Notes: Electronic Signature(s) Signed: 06/27/2022 5:08:49 PM By: Baruch Gouty RN, BSN Entered By: Baruch Gouty on 06/27/2022  15:23:18 -------------------------------------------------------------------------------- Pain Assessment Details Patient Name: Date of Service: Lori Hawking R. 06/27/2022 2:45 PM Medical Record  Number: [id number] Patient Account Number: [id number] Date of Birth/Sex: Treating RN: 08-Sep-1976 (46 y.o. Elam Dutch Primary Care Provider: Juluis Mire Other Clinician: Referring Provider: Treating Provider/Extender: Patrici Ranks in Treatment: 4 Active Problems Location of Pain Severity and Description of Pain Patient Has Paino Yes Site Locations Pain Location: Pain in Ulcers With Dressing Change: Yes Duration of the Pain. Constant / Intermittento Constant Rate the pain. Current Pain Level: 5 Worst Pain Level: 7 Least Pain Level: 3 Character of Pain Describe the Pain: Aching, Other: stinging Pain Management and Medication Current Pain Management: Medication: Yes Is the Current Pain Management Adequate: Adequate Rest: Yes How does your wound impact your activities of daily livingo Sleep: Yes Bathing: No Appetite: No Relationship With Others: No Bladder Continence: No Emotions: No Bowel Continence: No Work: No Toileting: No Drive: No Dressing: No Hobbies: No Electronic Signature(s) Signed: 06/27/2022 5:08:49 PM By: Baruch Gouty RN, BSN Entered By: Baruch Gouty on 06/27/2022 15:05:03 -------------------------------------------------------------------------------- Patient/Caregiver Education Details Patient Name: Date of Service: Lori Hew 10/3/2023andnbsp2:45 PM Medical Record Number: [id number] Patient Account Number: [id number] Date of Birth/Gender: Treating RN: Nov 22, 1975 (46 y.o. Elam Dutch Primary Care Physician: Juluis Mire Other Clinician: Referring Physician: Treating Physician/Extender: Patrici Ranks in Treatment: 4 Education Assessment Education Provided  To: Patient Education Topics Provided Pressure: Methods: Explain/Verbal Responses: Reinforcements needed, State content correctly Smoking and Wound Healing: Methods: Explain/Verbal Responses: Reinforcements needed, State content correctly Wound/Skin Impairment: Methods: Explain/Verbal Responses: Reinforcements needed, State content correctly Electronic Signature(s) Signed: 06/27/2022 5:08:49 PM By: Baruch Gouty RN, BSN Entered By: Baruch Gouty on 06/27/2022 15:24:22 -------------------------------------------------------------------------------- Wound Assessment Details Patient Name: Date of Service: Lori Hew 06/27/2022 2:45 PM Medical Record Number: [id number] Patient Account Number: [id number] Date of Birth/Sex: Treating RN: [birth year] (46 y.o. Elam Dutch Primary Care Provider: Juluis Mire Other Clinician: Referring Provider: Treating Provider/Extender: Patrici Ranks in Treatment: 4 Wound Status Wound Number: 1 Primary Etiology: Trauma, Other Wound Location: Left, Dorsal Foot Wound Status: Open Wounding Event: Shear/Friction Comorbid Anemia, Lymphedema, Hypertension, Hypotension, History: Cirrhosis Date Acquired: 05/20/2022 Weeks Of Treatment: 4 Clustered Wound: No Photos Wound Measurements Length: (cm) 3.5 Width: (cm) 5.4 Depth: (cm) 0.3 Area: (cm) 14.844 Volume: (cm) 4.453 % Reduction in Area: -1999.6% % Reduction in Volume: -6171.8% Epithelialization: None Tunneling: No Undermining: No Wound Description Classification: Full Thickness Without Exposed Support Structures Wound Margin: Distinct, outline attached Exudate Amount: Large Exudate Type: Serosanguineous Exudate Color: red, brown Foul Odor After Cleansing: No Slough/Fibrino Yes Wound Bed Granulation Amount: Large (67-100%) Exposed Structure Granulation Quality: Red Fascia Exposed: No Necrotic Amount: Small (1-33%) Fat Layer (Subcutaneous  Tissue) Exposed: Yes Necrotic Quality: Adherent Slough Tendon Exposed: No Muscle Exposed: No Joint Exposed: No Bone Exposed: No Periwound Skin Texture Texture Color No Abnormalities Noted: Yes No Abnormalities Noted: Yes Moisture No Abnormalities Noted: Yes Treatment Notes Wound #1 (Foot) Wound Laterality: Dorsal, Left Cleanser Soap and Water Discharge Instruction: May shower and wash wound with dial antibacterial soap and water prior to dressing change. Wound Cleanser Discharge Instruction: Cleanse the wound with wound cleanser prior to applying a clean dressing using gauze sponges, not tissue or cotton balls. Peri-Wound Care Topical Primary Dressing Promogran Prisma Matrix, 4.34 (sq in) (silver collagen) Discharge Instruction: Moisten collagen with saline or hydrogel Secondary Dressing ABD Pad, 5x9 Discharge Instruction: Apply over primary dressing as directed. Woven Gauze Sponge, Non-Sterile 4x4 in Discharge Instruction: Apply over primary dressing as directed. Secured With Secondary school teacher  4 inch (ACE bandage) Discharge Instruction: Secure with ACE bandage as directed. Kerlix Roll Sterile, 4.5x3.1 (in/yd) Discharge Instruction: Secure with Kerlix as directed. Compression Wrap Compression Stockings Add-Ons Electronic Signature(s) Signed: 06/27/2022 5:08:49 PM By: Baruch Gouty RN, BSN Entered By: Baruch Gouty on 06/27/2022 15:14:50 -------------------------------------------------------------------------------- Wound Assessment Details Patient Name: Date of Service: Lori Hew 06/27/2022 2:45 PM Medical Record Number: [id number] Patient Account Number: [id number] Date of Birth/Sex: Treating RN: 08-Aug-1976 (46 y.o. Elam Dutch Primary Care Provider: Juluis Mire Other Clinician: Referring Provider: Treating Provider/Extender: Patrici Ranks in Treatment: 4 Wound Status Wound Number: 2 Primary Etiology: Trauma,  Other Wound Location: Left Gluteal fold Wound Status: Open Wounding Event: Shear/Friction Comorbid Anemia, Lymphedema, Hypertension, Hypotension, History: Cirrhosis Date Acquired: 03/20/2022 Weeks Of Treatment: 4 Clustered Wound: No Photos Wound Measurements Length: (cm) 6.1 Width: (cm) 4.3 Depth: (cm) 0.3 Area: (cm) 20.601 Volume: (cm) 6.18 % Reduction in Area: 18.5% % Reduction in Volume: 81.2% Epithelialization: Small (1-33%) Undermining: Yes Starting Position (o'clock): 10 Ending Position (o'clock): 9 Maximum Distance: (cm) 2.1 Wound Description Classification: Full Thickness Without Exposed Support Structures Wound Margin: Distinct, outline attached Exudate Amount: Medium Exudate Type: Serosanguineous Exudate Color: red, brown Foul Odor After Cleansing: No Slough/Fibrino No Wound Bed Granulation Amount: Large (67-100%) Exposed Structure Granulation Quality: Red, Pink Fascia Exposed: No Necrotic Amount: Small (1-33%) Fat Layer (Subcutaneous Tissue) Exposed: Yes Necrotic Quality: Adherent Slough Tendon Exposed: No Muscle Exposed: No Joint Exposed: No Bone Exposed: No Periwound Skin Texture Texture Color No Abnormalities Noted: Yes No Abnormalities Noted: Yes Moisture Temperature / Pain No Abnormalities Noted: Yes Temperature: No Abnormality Tenderness on Palpation: Yes Treatment Notes Wound #2 (Gluteal fold) Wound Laterality: Left Cleanser Soap and Water Discharge Instruction: May shower and wash wound with dial antibacterial soap and water prior to dressing change. Wound Cleanser Discharge Instruction: Cleanse the wound with wound cleanser prior to applying a clean dressing using gauze sponges, not tissue or cotton balls. Peri-Wound Care Topical Primary Dressing KerraCel Ag Gelling Fiber Dressing, 4x5 in (silver alginate) Discharge Instruction: Apply silver alginate to wound bed as instructed Secondary Dressing ABD Pad, 5x9 Discharge Instruction:  Apply over primary dressing as directed. Woven Gauze Sponge, Non-Sterile 4x4 in Discharge Instruction: Apply over primary dressing as directed. Secured With Compression Wrap Compression Stockings Environmental education officer) Signed: 06/27/2022 5:08:49 PM By: Baruch Gouty RN, BSN Entered By: Baruch Gouty on 06/27/2022 15:21:46 -------------------------------------------------------------------------------- Vitals Details Patient Name: Date of Service: Lori Hawking R. 06/27/2022 2:45 PM Medical Record Number: [id number] Patient Account Number: [id number] Date of Birth/Sex: Treating RN: 12-27-1975 (46 y.o. Elam Dutch Primary Care Provider: Juluis Mire Other Clinician: Referring Provider: Treating Provider/Extender: Patrici Ranks in Treatment: 4 Vital Signs Time Taken: 15:07 Temperature (F): 98.6 Height (in): 66 Pulse (bpm): 121 Weight (lbs): 197 Respiratory Rate (breaths/min): 18 Body Mass Index (BMI): 31.8 Blood Pressure (mmHg): 131/85 Reference Range: 80 - 120 mg / dl Electronic Signature(s) Signed: 06/27/2022 5:08:49 PM By: Baruch Gouty RN, BSN Entered By: Baruch Gouty on 06/27/2022 15:07:32

## 2022-06-27 NOTE — Progress Notes (Signed)
[name], [name] ([id number]) Visit Report for 06/27/2022 Chief Complaint Document Details Patient Name: Date of Service: [name], [name] 06/27/2022 2:45 PM Medical Record Number: [id number] Patient Account Number: [id number] Date of Birth/Sex: Treating RN: 08/04/1976 (46 y.o. Elam Dutch Primary Care Provider: Juluis Mire Other Clinician: Referring Provider: Treating Provider/Extender: Patrici Ranks in Treatment: 4 Information Obtained from: Patient Chief Complaint Patient seen for complaints of Non-Healing Wounds. Electronic Signature(s) Signed: 06/27/2022 3:49:49 PM By: Fredirick Maudlin MD FACS Entered By: Fredirick Maudlin on 06/27/2022 15:49:49 -------------------------------------------------------------------------------- Debridement Details Patient Name: Date of Service: Lori Hew. 06/27/2022 2:45 PM Medical Record Number: [id number] Patient Account Number: [id number] Date of Birth/Sex: Treating RN: [birth year] (46 y.o. Elam Dutch Primary Care Provider: Juluis Mire Other Clinician: Referring Provider: Treating Provider/Extender: Patrici Ranks in Treatment: 4 Debridement Performed for Assessment: Wound #2 Left Gluteal fold Performed By: Physician Fredirick Maudlin, MD Debridement Type: Debridement Level of Consciousness (Pre-procedure): Awake and Alert Pre-procedure Verification/Time Out Yes - 15:35 Taken: Start Time: 15:35 T Area Debrided (L x W): otal 6.1 (cm) x 4.3 (cm) = 26.23 (cm) Tissue and other material debrided: Non-Viable, Biofilm Level: Non-Viable Tissue Debridement Description: Selective/Open Wound Instrument: Curette Bleeding: Minimum Hemostasis Achieved: Pressure Procedural Pain: 3 Post Procedural Pain: 1 Response to Treatment: Procedure was tolerated well Level of Consciousness (Post- Awake and Alert procedure): Post Debridement Measurements of Total  Wound Length: (cm) 6.1 Width: (cm) 4.3 Depth: (cm) 0.3 Volume: (cm) 6.18 Character of Wound/Ulcer Post Debridement: Improved Post Procedure Diagnosis Same as Pre-procedure Notes scribed by Baruch Gouty, RN for Dr. Celine Ahr Electronic Signature(s) Signed: 06/27/2022 4:00:31 PM By: Fredirick Maudlin MD FACS Signed: 06/27/2022 5:08:49 PM By: Baruch Gouty RN, BSN Entered By: Baruch Gouty on 06/27/2022 15:57:15 -------------------------------------------------------------------------------- HPI Details Patient Name: Date of Service: Lori Hawking R. 06/27/2022 2:45 PM Medical Record Number: [id number] Patient Account Number: [id number] Date of Birth/Sex: Treating RN: May 27, 1976 (46 y.o. Elam Dutch Primary Care Provider: Juluis Mire Other Clinician: Referring Provider: Treating Provider/Extender: Patrici Ranks in Treatment: 4 History of Present Illness HPI Description: ADMISSION 05/24/2022 This is a 46 year old woman with a past medical history significant for hypertension, alcoholic hepatitis, chronic anemia, chronic kidney disease, and peptic ulcer disease. She was admitted to the hospital in the middle of July for hypovolemic shock secondary to dehydration from gastroenteritis. She was found on admission to have an ulcer on her left buttock within the confines of the natal cleft and approaching the anal verge. The etiology was felt to be moisture and friction rather than pressure, as the patient is completely ambulatory. No debridement was performed. She was readmitted at the beginning of August with worsening lower extremity edema and fatigue. The wound was basically unchanged. Wound nursing was consulted and they recommended using silver alginate. She was discharged from the hospital with referral to the wound care center. This past weekend, she noted a wound on her dorsal left foot that she thinks was secondary to friction from shoes.  She is here for evaluation of both these wounds. ABI in clinic today was 1.18. She reports that she has quit drinking but she continues to smoke. On the dorsal surface of her left foot between her fourth and fifth metatarsal heads, there is a circular wound exposing the fat layer. There is some slough and eschar accumulation. It does appear consistent with her history of abrasion. On the medial aspect of her left buttock extending into the  natal cleft and towards the anal verge. There is a large ulcer with a very clean surface. There is substantial undermining at the cranial portion of the wound. No purulent drainage or malodor. 06/08/2022: The patient returns today with significant worsening of her dorsal foot wound. Apparently she saw her PCP yesterday who was concerned for cellulitis but did not prescribe any antibiotics. She was also sent for a DVT scan which was negative. There is heavy slough as well as liquefactive necrosis of the fat layer. There is a bulla between the second and third toes that looks as though it may rupture in the near future. The wound undermines under the skin towards this bulla. No frankly purulent drainage or any odor. Her gluteal wound is very clean but is relatively unchanged in size. She has been managing her wounds on her own and finds it difficult to pack the silver alginate into the undermined portion of the buttock wound. She says that she has been putting peroxide on her foot. 06/19/2022: The patient was not seen as a wound care visit last week because she arrived too late to her appointment. She did have a nursing visit, however. The culture that I took on 14 September grew out a polymicrobial population including Bacteroides fragilis Citrobacter Morganella Escherichia coli Enterococcus faecalis Pseudomonas aeruginosa and many others. I prescribed Bactrim and Augmentin for this polymicrobial infection based upon the resistance genes detected. Keystone topical  compounded antibiotic was also ordered and she has been using that on her foot as well. Unfortunately, the wound on her dorsal foot continues to deteriorate. There is a larger area of undermining and more necrotic tissue, including muscle, on the wound surface. No significant malodor or frank pus. Her gluteal wound, on the other hand, looks quite good. It is smaller and very clean and the undermined portion has contracted substantially. 06/27/2022: Last week after our visit, she went to the hospital where she was admitted with sepsis. She underwent surgical debridement of her dorsal foot wound. Cultures taken intraoperatively were negative and a piece of PuraPly was sutured in place. She is currently taking Augmentin as prescribed on discharge. Her gluteal wound continues to contract. It is more superficial and there is even less undermining. She does have some biofilm accumulation on the surface. The dorsal foot wound is stained yellow from the Xeroform that was applied by podiatry. PuraPly is covering about a third of the wound. The rest of the wound surface is very clean. She did have quite a bit of drainage, but it was not purulent or malodorous. Electronic Signature(s) Signed: 06/27/2022 3:52:02 PM By: Fredirick Maudlin MD FACS Entered By: Fredirick Maudlin on 06/27/2022 15:52:01 -------------------------------------------------------------------------------- Physical Exam Details Patient Name: Date of Service: Lori Hew 06/27/2022 2:45 PM Medical Record Number: [id number] Patient Account Number: [id number] Date of Birth/Sex: Treating RN: 1976-03-11 (46 y.o. Elam Dutch Primary Care Provider: Juluis Mire Other Clinician: Referring Provider: Treating Provider/Extender: Patrici Ranks in Treatment: 4 Constitutional .Tachycardic, asymptomatic.. . . No acute distress.Marland Kitchen Respiratory Normal work of breathing on room air.. Notes 06/27/2022: Her  gluteal wound continues to contract. It is more superficial and there is even less undermining. She does have some biofilm accumulation on the surface. The dorsal foot wound is stained yellow from the Xeroform that was applied by podiatry. PuraPly is covering about a third of the wound. The rest of the wound surface is very clean. She did have quite a bit of drainage, but it was  not purulent or malodorous. Electronic Signature(s) Signed: 06/27/2022 3:53:44 PM By: Fredirick Maudlin MD FACS Entered By: Fredirick Maudlin on 06/27/2022 15:53:44 -------------------------------------------------------------------------------- Physician Orders Details Patient Name: Date of Service: Lori Hew 06/27/2022 2:45 PM Medical Record Number: [id number] Patient Account Number: [id number] Date of Birth/Sex: Treating RN: [birth year] (46 y.o. Martyn Malay, Linda Primary Care Provider: Juluis Mire Other Clinician: Referring Provider: Treating Provider/Extender: Patrici Ranks in Treatment: 4 Verbal / Phone Orders: No Diagnosis Coding ICD-10 Coding Code Description [phone] Non-pressure chronic ulcer of other part of left foot with fat layer exposed L98.412 Non-pressure chronic ulcer of buttock with fat layer exposed N18.9 Chronic kidney disease, unspecified E26.83 Alcoholic hepatitis without ascites Follow-up Appointments ppointment in 1 week. - Dr. Celine Ahr - Rm 4 Return A Bathing/ Shower/ Hygiene May shower with protection but do not get wound dressing(s) wet. Edema Control - Lymphedema / SCD / Other Elevate legs to the level of the heart or above for 30 minutes daily and/or when sitting, a frequency of: Avoid standing for long periods of time. Wound Treatment Wound #1 - Foot Wound Laterality: Dorsal, Left Cleanser: Soap and Water Every Other Day/30 Days Discharge Instructions: May shower and wash wound with dial antibacterial soap and water prior to dressing  change. Cleanser: Wound Cleanser Every Other Day/30 Days Discharge Instructions: Cleanse the wound with wound cleanser prior to applying a clean dressing using gauze sponges, not tissue or cotton balls. Prim Dressing: Promogran Prisma Matrix, 4.34 (sq in) (silver collagen) Every Other Day/30 Days ary Discharge Instructions: Moisten collagen with saline or hydrogel Secondary Dressing: ABD Pad, 5x9 Every Other Day/30 Days Discharge Instructions: Apply over primary dressing as directed. Secondary Dressing: Woven Gauze Sponge, Non-Sterile 4x4 in Every Other Day/30 Days Discharge Instructions: Apply over primary dressing as directed. Secured With: Elastic Bandage 4 inch (ACE bandage) Every Other Day/30 Days Discharge Instructions: Secure with ACE bandage as directed. Secured With: [employer], 4.5x3.1 (in/yd) Every Other Day/30 Days Discharge Instructions: Secure with Kerlix as directed. Wound #2 - Gluteal fold Wound Laterality: Left Cleanser: Soap and Water 1 x Per Day/30 Days Discharge Instructions: May shower and wash wound with dial antibacterial soap and water prior to dressing change. Cleanser: Wound Cleanser 1 x Per Day/30 Days Discharge Instructions: Cleanse the wound with wound cleanser prior to applying a clean dressing using gauze sponges, not tissue or cotton balls. Prim Dressing: KerraCel Ag Gelling Fiber Dressing, 4x5 in (silver alginate) 1 x Per Day/30 Days ary Discharge Instructions: Apply silver alginate to wound bed as instructed Secondary Dressing: ABD Pad, 5x9 1 x Per Day/30 Days Discharge Instructions: Apply over primary dressing as directed. Secondary Dressing: Woven Gauze Sponge, Non-Sterile 4x4 in 1 x Per Day/30 Days Discharge Instructions: Apply over primary dressing as directed. Electronic Signature(s) Signed: 06/27/2022 4:00:31 PM By: Fredirick Maudlin MD FACS Entered By: Fredirick Maudlin on 06/27/2022  15:54:12 -------------------------------------------------------------------------------- Problem List Details Patient Name: Date of Service: Lori Hew. 06/27/2022 2:45 PM Medical Record Number: [id number] Patient Account Number: [id number] Date of Birth/Sex: Treating RN: 08/24/1976 (46 y.o. Elam Dutch Primary Care Provider: Juluis Mire Other Clinician: Referring Provider: Treating Provider/Extender: Patrici Ranks in Treatment: 4 Active Problems ICD-10 Encounter Code Description Active Date MDM Diagnosis [phone] Non-pressure chronic ulcer of other part of left foot with fat layer exposed 05/24/2022 No Yes L98.412 Non-pressure chronic ulcer of buttock with fat layer exposed 05/24/2022 No Yes N18.9 Chronic kidney disease, unspecified 05/24/2022 No Yes H41.74 Alcoholic  hepatitis without ascites 05/24/2022 No Yes Inactive Problems Resolved Problems Electronic Signature(s) Signed: 06/27/2022 3:49:18 PM By: Fredirick Maudlin MD FACS Signed: 06/27/2022 3:49:18 PM By: Fredirick Maudlin MD FACS Entered By: Fredirick Maudlin on 06/27/2022 15:49:18 -------------------------------------------------------------------------------- Progress Note Details Patient Name: Date of Service: Lori Hew. 06/27/2022 2:45 PM Medical Record Number: [id number] Patient Account Number: [id number] Date of Birth/Sex: Treating RN: [birth year] (46 y.o. Elam Dutch Primary Care Provider: Juluis Mire Other Clinician: Referring Provider: Treating Provider/Extender: Patrici Ranks in Treatment: 4 Subjective Chief Complaint Information obtained from Patient Patient seen for complaints of Non-Healing Wounds. History of Present Illness (HPI) ADMISSION 05/24/2022 This is a 46 year old woman with a past medical history significant for hypertension, alcoholic hepatitis, chronic anemia, chronic kidney disease, and peptic ulcer  disease. She was admitted to the hospital in the middle of July for hypovolemic shock secondary to dehydration from gastroenteritis. She was found on admission to have an ulcer on her left buttock within the confines of the natal cleft and approaching the anal verge. The etiology was felt to be moisture and friction rather than pressure, as the patient is completely ambulatory. No debridement was performed. She was readmitted at the beginning of August with worsening lower extremity edema and fatigue. The wound was basically unchanged. Wound nursing was consulted and they recommended using silver alginate. She was discharged from the hospital with referral to the wound care center. This past weekend, she noted a wound on her dorsal left foot that she thinks was secondary to friction from shoes. She is here for evaluation of both these wounds. ABI in clinic today was 1.18. She reports that she has quit drinking but she continues to smoke. On the dorsal surface of her left foot between her fourth and fifth metatarsal heads, there is a circular wound exposing the fat layer. There is some slough and eschar accumulation. It does appear consistent with her history of abrasion. On the medial aspect of her left buttock extending into the natal cleft and towards the anal verge. There is a large ulcer with a very clean surface. There is substantial undermining at the cranial portion of the wound. No purulent drainage or malodor. 06/08/2022: The patient returns today with significant worsening of her dorsal foot wound. Apparently she saw her PCP yesterday who was concerned for cellulitis but did not prescribe any antibiotics. She was also sent for a DVT scan which was negative. There is heavy slough as well as liquefactive necrosis of the fat layer. There is a bulla between the second and third toes that looks as though it may rupture in the near future. The wound undermines under the skin towards this bulla. No  frankly purulent drainage or any odor. Her gluteal wound is very clean but is relatively unchanged in size. She has been managing her wounds on her own and finds it difficult to pack the silver alginate into the undermined portion of the buttock wound. She says that she has been putting peroxide on her foot. 06/19/2022: The patient was not seen as a wound care visit last week because she arrived too late to her appointment. She did have a nursing visit, however. The culture that I took on 14 September grew out a polymicrobial population including Bacteroides fragilis Citrobacter Morganella Escherichia coli Enterococcus faecalis Pseudomonas aeruginosa and many others. I prescribed Bactrim and Augmentin for this polymicrobial infection based upon the resistance genes detected. Keystone topical compounded antibiotic was also ordered and she has been  using that on her foot as well. Unfortunately, the wound on her dorsal foot continues to deteriorate. There is a larger area of undermining and more necrotic tissue, including muscle, on the wound surface. No significant malodor or frank pus. Her gluteal wound, on the other hand, looks quite good. It is smaller and very clean and the undermined portion has contracted substantially. 06/27/2022: Last week after our visit, she went to the hospital where she was admitted with sepsis. She underwent surgical debridement of her dorsal foot wound. Cultures taken intraoperatively were negative and a piece of PuraPly was sutured in place. She is currently taking Augmentin as prescribed on discharge. Her gluteal wound continues to contract. It is more superficial and there is even less undermining. She does have some biofilm accumulation on the surface. The dorsal foot wound is stained yellow from the Xeroform that was applied by podiatry. PuraPly is covering about a third of the wound. The rest of the wound surface is very clean. She did have quite a bit of drainage, but  it was not purulent or malodorous. Patient History Information obtained from Patient. Family History Cancer - Mother, Diabetes - Mother, Hypertension - Mother,Maternal Grandparents,Paternal Grandparents, No family history of Heart Disease, Hereditary Spherocytosis, Kidney Disease, Lung Disease, Seizures, Stroke, Thyroid Problems, Tuberculosis. Social History Current every day smoker - .5 pack a day, Marital Status - Single, Alcohol Use - Never, Drug Use - No History, Caffeine Use - Daily. Medical History Hematologic/Lymphatic Patient has history of Anemia, Lymphedema Cardiovascular Patient has history of Hypertension, Hypotension Gastrointestinal Patient has history of Cirrhosis Endocrine Denies history of Type I Diabetes, Type II Diabetes Hospitalization/Surgery History - esophagogastroduodenoscopy. - biopsy. - cholecystectomy. - breast biopsy. - abdominoplasty. - tummy tuck. - hernia repair. - roux-en-y procedure. - tumor removal. - wrist ganglion incision. Medical A Surgical History Notes nd Gastrointestinal slow GI blood loss, alcoholic hepatitis Genitourinary CKD stage III Psychiatric depression Objective Constitutional Tachycardic, asymptomatic.Marland Kitchen No acute distress.. Vitals Time Taken: 3:07 PM, Height: 66 in, Weight: 197 lbs, BMI: 31.8, Temperature: 98.6 F, Pulse: 121 bpm, Respiratory Rate: 18 breaths/min, Blood Pressure: 131/85 mmHg. Respiratory Normal work of breathing on room air.. General Notes: 06/27/2022: Her gluteal wound continues to contract. It is more superficial and there is even less undermining. She does have some biofilm accumulation on the surface. The dorsal foot wound is stained yellow from the Xeroform that was applied by podiatry. PuraPly is covering about a third of the wound. The rest of the wound surface is very clean. She did have quite a bit of drainage, but it was not purulent or malodorous. Integumentary (Hair, Skin) Wound #1 status is Open.  Original cause of wound was Shear/Friction. The date acquired was: 05/20/2022. The wound has been in treatment 4 weeks. The wound is located on the Left,Dorsal Foot. The wound measures 3.5cm length x 5.4cm width x 0.3cm depth; 14.844cm^2 area and 4.453cm^3 volume. There is Fat Layer (Subcutaneous Tissue) exposed. There is no tunneling or undermining noted. There is a large amount of serosanguineous drainage noted. The wound margin is distinct with the outline attached to the wound base. There is large (67-100%) red granulation within the wound bed. There is a small (1-33%) amount of necrotic tissue within the wound bed including Adherent Slough. The periwound skin appearance had no abnormalities noted for texture. The periwound skin appearance had no abnormalities noted for moisture. The periwound skin appearance had no abnormalities noted for color. Wound #2 status is Open. Original cause of  wound was Shear/Friction. The date acquired was: 03/20/2022. The wound has been in treatment 4 weeks. The wound is located on the Left Gluteal fold. The wound measures 6.1cm length x 4.3cm width x 0.3cm depth; 20.601cm^2 area and 6.18cm^3 volume. There is Fat Layer (Subcutaneous Tissue) exposed. There is undermining starting at 10:00 and ending at 9:00 with a maximum distance of 2.1cm. There is a medium amount of serosanguineous drainage noted. The wound margin is distinct with the outline attached to the wound base. There is large (67-100%) red, pink granulation within the wound bed. There is a small (1-33%) amount of necrotic tissue within the wound bed including Adherent Slough. The periwound skin appearance had no abnormalities noted for texture. The periwound skin appearance had no abnormalities noted for moisture. The periwound skin appearance had no abnormalities noted for color. Periwound temperature was noted as No Abnormality. The periwound has tenderness on palpation. 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 There was a Selective/Open Wound Non-Viable Tissue Debridement with a total area of 26.23 sq cm performed by Fredirick Maudlin, MD. With the following instrument(s): Curette to remove Non-Viable tissue/material. Material removed includes Biofilm. No specimens were taken. A time out was conducted at 15:35, prior to the start of the procedure. A Minimum amount of bleeding was controlled with Pressure. The procedure was tolerated well with a pain level of 3 throughout and a pain level of 1 following the procedure. Post Debridement Measurements: 6.1cm length x 4.3cm width x 0.3cm depth; 6.18cm^3 volume. Character of Wound/Ulcer Post Debridement is improved. Post procedure Diagnosis Wound #2: Same as Pre-Procedure General Notes: scribed by Baruch Gouty, RN for Dr. Celine Ahr. Plan Follow-up Appointments: Return Appointment in 1 week. - Dr. Celine Ahr - Rm 4 Bathing/ Shower/ Hygiene: May shower with protection but do not get wound dressing(s) wet. Edema Control - Lymphedema / SCD / Other: Elevate legs to the level of the heart or above for 30 minutes daily and/or when sitting, a frequency of: Avoid standing for long periods of time. WOUND #1: - Foot Wound Laterality: Dorsal, Left Cleanser: Soap and Water Every Other Day/30 Days Discharge Instructions: May shower and wash wound with dial antibacterial soap and water prior to dressing change. Cleanser: Wound Cleanser Every Other Day/30 Days Discharge Instructions: Cleanse the wound with wound cleanser prior to applying a clean dressing using gauze sponges, not tissue or cotton balls. Prim Dressing: Promogran Prisma Matrix,  4.34 (sq in) (silver collagen) Every Other Day/30 Days ary Discharge Instructions: Moisten collagen with saline or hydrogel Secondary Dressing: ABD Pad, 5x9 Every Other Day/30 Days Discharge Instructions: Apply over primary dressing as directed. Secondary Dressing: Woven Gauze Sponge, Non-Sterile 4x4 in Every Other Day/30 Days Discharge Instructions: Apply over primary dressing as directed. Secured With: Elastic Bandage 4 inch (ACE bandage) Every Other Day/30 Days Discharge Instructions: Secure with ACE bandage as directed. Secured With: [employer], 4.5x3.1 (in/yd) Every Other Day/30 Days Discharge Instructions: Secure with Kerlix as directed. WOUND #2: - Gluteal fold Wound Laterality: Left Cleanser: Soap and Water 1 x Per Day/30 Days Discharge Instructions: May shower and wash wound with dial antibacterial soap and water prior to dressing change. Cleanser: Wound Cleanser 1 x Per Day/30 Days Discharge Instructions: Cleanse the  wound with wound cleanser prior to applying a clean dressing using gauze sponges, not tissue or cotton balls. Prim Dressing: KerraCel Ag Gelling Fiber Dressing, 4x5 in (silver alginate) 1 x Per Day/30 Days ary Discharge Instructions: Apply silver alginate to wound bed as instructed Secondary Dressing: ABD Pad, 5x9 1 x Per Day/30 Days Discharge Instructions: Apply over primary dressing as directed. Secondary Dressing: Woven Gauze Sponge, Non-Sterile 4x4 in 1 x Per Day/30 Days Discharge Instructions: Apply over primary dressing as directed. 06/27/2022: Her gluteal wound continues to contract. It is more superficial and there is even less undermining. She does have some biofilm accumulation on the surface. The dorsal foot wound is stained yellow from the Xeroform that was applied by podiatry. PuraPly is covering about a third of the wound. The rest of the wound surface is very clean. She did have quite a bit of drainage, but it was not purulent or malodorous. I  used a curette to debride the biofilm off of the gluteal wound. I trimmed the hanging PuraPly that was not in contact with her dorsal foot wound using some scissors. We will continue silver alginate to the gluteal wound and Prisma silver collagen to the foot. She will complete the course of oral Augmentin prescribed on her discharge from the hospital. Follow-up in 1 week. Electronic Signature(s) Signed: 06/27/2022 4:00:31 PM By: Fredirick Maudlin MD FACS Signed: 06/27/2022 5:08:49 PM By: Baruch Gouty RN, BSN Previous Signature: 06/27/2022 3:55:13 PM Version By: Fredirick Maudlin MD FACS Entered By: Baruch Gouty on 06/27/2022 15:57:30 -------------------------------------------------------------------------------- HxROS Details Patient Name: Date of Service: Lori Hawking R. 06/27/2022 2:45 PM Medical Record Number: [id number] Patient Account Number: [id number] Date of Birth/Sex: Treating RN: [birth year] (46 y.o. Elam Dutch Primary Care Provider: Juluis Mire Other Clinician: Referring Provider: Treating Provider/Extender: Patrici Ranks in Treatment: 4 Information Obtained From Patient Hematologic/Lymphatic Medical History: Positive for: Anemia; Lymphedema Cardiovascular Medical History: Positive for: Hypertension; Hypotension Gastrointestinal Medical History: Positive for: Cirrhosis Past Medical History Notes: slow GI blood loss, alcoholic hepatitis Endocrine Medical History: Negative for: Type I Diabetes; Type II Diabetes Genitourinary Medical History: Past Medical History Notes: CKD stage III Psychiatric Medical History: Past Medical History Notes: depression Immunizations Pneumococcal Vaccine: Received Pneumococcal Vaccination: No Implantable Devices None Hospitalization / Surgery History Type of Hospitalization/Surgery esophagogastroduodenoscopy biopsy cholecystectomy breast biopsy abdominoplasty tummy  tuck hernia repair roux-en-y procedure tumor removal wrist ganglion incision Family and Social History Cancer: Yes - Mother; Diabetes: Yes - Mother; Heart Disease: No; Hereditary Spherocytosis: No; Hypertension: Yes - Mother,Maternal Grandparents,Paternal Grandparents; Kidney Disease: No; Lung Disease: No; Seizures: No; Stroke: No; Thyroid Problems: No; Tuberculosis: No; Current every day smoker - .5 pack a day; Marital Status - Single; Alcohol Use: Never; Drug Use: No History; Caffeine Use: Daily; Financial Concerns: Yes; Food, Clothing or Shelter Needs: No; Support System Lacking: No; Transportation Concerns: No Engineer, maintenance) Signed: 06/27/2022 4:00:31 PM By: Fredirick Maudlin MD FACS Signed: 06/27/2022 5:08:49 PM By: Baruch Gouty RN, BSN Entered By: Fredirick Maudlin on 06/27/2022 15:53:09 -------------------------------------------------------------------------------- Cassandra Details Patient Name: Date of Service: Lori Hawking R. 06/27/2022 Medical Record Number: [id number] Patient Account Number: [id number] Date of Birth/Sex: Treating RN: [birth year] (46 y.o. 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 Modifier: Quantity: 1 CPT4 Code: [id number] Description: [id number] - DEBRIDE WOUND EA ADDL 20 SQ CM ICD-10 Diagnosis Description L98.412 Non-pressure chronic  ulcer of buttock with fat layer exposed Modifier: Quantity: 1 Physician Procedures : CPT4 Code Description Modifier [id number] - WC PHYS LEVEL 4 - EST PT 25 ICD-10 Diagnosis Description L97.522 Non-pressure chronic ulcer of other part of left foot with fat layer exposed L98.412 Non-pressure chronic ulcer of buttock with fat layer  exposed [id number] Alcoholic hepatitis without ascites N18.9 Chronic kidney disease, unspecified Quantity: 1 : [id number] - WC PHYS DEBR WO ANESTH 20 SQ CM ICD-10 Diagnosis Description L98.412 Non-pressure chronic ulcer of buttock with fat layer exposed Quantity: 1 : [id number] - WC PHYS DEBR WO ANESTH EA ADD 20 CM ICD-10 Diagnosis Description L98.412 Non-pressure chronic ulcer of buttock with fat layer exposed Quantity: 1 Electronic Signature(s) Signed: 06/27/2022 3:55:36 PM By: Fredirick Maudlin MD FACS Entered By: Fredirick Maudlin on 06/27/2022 15:55:36

## 2022-06-28 ENCOUNTER — Ambulatory Visit: Payer: Self-pay | Admitting: Infectious Diseases

## 2022-06-28 NOTE — Telephone Encounter (Signed)
Will have to defer to [facility].

## 2022-06-28 NOTE — Telephone Encounter (Signed)
I explained to the patient that she will need to contact Dr Acie Fredrickson about the refill for midodrine.  She said she never saw Dr Acie Fredrickson but reminded her that she saw him in Sept 2023.  She then said that she may mean a different medication. She will have to check her medication list and call me back.   In the meantime, she said lasix was ordered when she was discharged but she contacted [facility] and was told that they never received it.  Are you able to assist with this?

## 2022-07-03 ENCOUNTER — Other Ambulatory Visit: Payer: Self-pay | Admitting: Primary Care

## 2022-07-03 ENCOUNTER — Ambulatory Visit (INDEPENDENT_AMBULATORY_CARE_PROVIDER_SITE_OTHER): Payer: Self-pay | Admitting: Internal Medicine

## 2022-07-03 ENCOUNTER — Other Ambulatory Visit: Payer: Self-pay

## 2022-07-03 ENCOUNTER — Encounter: Payer: Self-pay | Admitting: Internal Medicine

## 2022-07-03 VITALS — BP 109/71 | HR 114 | Temp 98.5°F | Ht 66.0 in | Wt 187.0 lb

## 2022-07-03 DIAGNOSIS — S91302S Unspecified open wound, left foot, sequela: Secondary | ICD-10-CM

## 2022-07-03 DIAGNOSIS — S91302D Unspecified open wound, left foot, subsequent encounter: Secondary | ICD-10-CM

## 2022-07-03 MED ORDER — DOXYCYCLINE HYCLATE 100 MG PO TABS: 100.0000 mg | ORAL_TABLET | Freq: Two times a day (BID) | ORAL | 0 refills | Status: DC

## 2022-07-03 MED ORDER — NICOTINE POLACRILEX 2 MG MT GUM: 2.0000 mg | CHEWING_GUM | OROMUCOSAL | 0 refills | Status: DC | PRN

## 2022-07-03 NOTE — Progress Notes (Signed)
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 ([place])    Alcoholic peripheral neuropathy ([place])    Open wound of left foot 06/20/2022   Acute hyponatremia 06/20/2022   GERD (gastroesophageal reflux disease) 06/20/2022   Essential hypertension 06/20/2022   Tobacco abuse 06/20/2022   Cellulitis 05/03/2022   Rash 04/30/2022   Anemia 04/25/2022   Lactic acidosis 04/19/2022   Hypokalemia 04/19/2022   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[place]) 04/19/2022   Acute respiratory failure with hypoxia ([place]) 04/19/2022   Aspiration pneumonia ([place]) 04/19/2022   Anasarca 04/19/2022   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 female with PMHx as below presents for management of left foot wound. She was admitted to [facility] 9/26-29 for left foot wound.  She note wound has been present since fall in June , 2023.  She has been on multiple course of antibiotics including Cipro floxacillin, Flagyl, Augmentin, doxycycline.  Followed closely by wound care.  She had worsening swelling of the left foot on admission.  Started on vancomycin, cefepime and Flagyl.  MRI of left foot did not show evidence of osteomyelitis.  Taken to the OR with Dr. Loel Lofty podiatry for I&D of dorsal  foot ulcer with debridement to the level of subcutaneous fat tissue and extensor tendons, skins graft placed.  Infectious disease was consulted patient was discharged on 1 dose of oritavancin and Augmentin to be completed x4 weeks, OR(negative Cx).  ID appointment on 10/4 with Dr. Carleene Mains for possibly addition of doxycycline after oritavancin had worn off. today: Patient reports she is tolerating her antibiotics for it.  Reports no missed doses.  Denies pain at wound.  Denies fever, chills, nausea, vomiting, diarrhea.   Review of Systems  All other systems reviewed and are negative.   Past Medical History:  Diagnosis Date   Acid reflux    Anemia    Ulcer     Outpatient Medications Prior to Visit  Medication Sig Dispense Refill   amoxicillin-clavulanate (AUGMENTIN) 875-125 MG tablet Take 1 tablet by mouth 2 (two) times daily for 27 days. 54 tablet 0   busPIRone (BUSPAR) 15 MG tablet Take 15 mg by mouth daily as needed (anxiety).     Cyanocobalamin (B-12 PO) Take 1 tablet by mouth daily.     ferrous sulfate 325 (65 FE) MG tablet Take 1 tablet (325 mg total) by mouth daily with breakfast. 90 tablet 1   folic acid (FOLVITE) 1 MG tablet Take 1 tablet (1 mg total) by mouth daily. 30 tablet 6   furosemide (LASIX) 20 MG tablet Take 1 tablet (20 mg total) by mouth daily. 30 tablet 11   lactulose (CHRONULAC) 10 GM/15ML solution Take 30 mLs (20 g total) by mouth 3 (three)  times daily as needed for mild constipation or moderate constipation. 473 mL 1   loperamide (IMODIUM) 2 MG capsule Take 1 capsule (2 mg total) by mouth as needed for diarrhea or loose stools. 30 capsule 0   midodrine (PROAMATINE) 10 MG tablet Take 1 tablet (10 mg total) by mouth 3 (three) times daily with meals. 45 tablet 0   Multiple Vitamin (MULTIVITAMIN) tablet Take 1 tablet by mouth daily.     ondansetron (ZOFRAN-ODT) 8 MG disintegrating tablet Take 1 tablet (8 mg total) by mouth every 8 (eight) hours as needed for nausea or  vomiting. 30 tablet 1   pantoprazole (PROTONIX) 40 MG tablet Take 1 tablet (40 mg total) by mouth 2 (two) times daily before a meal. 60 tablet 1   sertraline (ZOLOFT) 50 MG tablet Take 50 mg by mouth daily.     spironolactone (ALDACTONE) 50 MG tablet Take 1 tablet (50 mg total) by mouth daily. 30 tablet 1   No facility-administered medications prior to visit.     Allergies  Allergen Reactions   Tape Rash    Clear tape    Social History   Tobacco Use   Smoking status: Every Day    Packs/day: 0.25    Years: 25.00    Total pack years: 6.25    Types: Cigarettes    Passive exposure: Current   Smokeless tobacco: Never  Vaping Use   Vaping Use: Never used  Substance Use Topics   Alcohol use: Not Currently    Comment: previously consumed 1-2 glasses of wine per night   Drug use: No    Family History  Problem Relation Age of Onset   Cancer Mother        kidney   Cancer Paternal Aunt    Breast cancer Maternal Aunt    Pancreatic cancer Maternal Aunt    Lung cancer Paternal Uncle     Objective:  There were no vitals filed for this visit. There is no height or weight on file to calculate BMI.  Physical Exam Constitutional:      Appearance: Normal appearance.  HENT:     Head: Normocephalic and atraumatic.     Right Ear: Tympanic membrane normal.     Left Ear: Tympanic membrane normal.     Nose: Nose normal.     Mouth/Throat:     Mouth: Mucous membranes are moist.  Eyes:     Extraocular Movements: Extraocular movements intact.     Conjunctiva/sclera: Conjunctivae normal.     Pupils: Pupils are equal, round, and reactive to light.  Cardiovascular:     Rate and Rhythm: Normal rate and regular rhythm.     Heart sounds: No murmur heard.    No friction rub. No gallop.  Pulmonary:     Effort: Pulmonary effort is normal.     Breath sounds: Normal breath sounds.  Abdominal:     General: Abdomen is flat.     Palpations: Abdomen is soft.  Musculoskeletal:        General:  Normal range of motion.  Skin:    General: Skin is warm and dry.  Neurological:     General: No focal deficit present.     Mental Status: She is alert and oriented to person, place, and time.  Psychiatric:        Mood and Affect: Mood normal.      Lab Results: Lab Results  Component Value Date   WBC 7.2 06/23/2022   HGB 8.7 (L) 06/23/2022  HCT 27.0 (L) 06/23/2022   MCV 100.9 (H) 06/23/2022   PLT 74 (L) 06/23/2022    Lab Results  Component Value Date   CREATININE 1.35 (H) 06/23/2022   BUN 8 06/23/2022   NA 136 06/23/2022   K 3.7 06/23/2022   CL 112 (H) 06/23/2022   CO2 19 (L) 06/23/2022    Lab Results  Component Value Date   ALT 24 06/22/2022   AST 65 (H) 06/22/2022   ALKPHOS 139 (H) 06/22/2022   BILITOT 2.4 (H) 06/22/2022     Assessment & Plan:  #Left foot wound SP I&D to the level of tendon -OR on 9/27 with NG -Received vancomycin+cefepime+flagyl during hospitalization, discharged on oritavancin x1 and Augmentin with ID follow-up and possible starting doxy as oritavancin would have worn off -No acute sign of skin soft tissue infection today, although wound is still open Plan: -Add doxycyline 180m PO bid, continue Augmentin to complete 4 weeks of antibiotics form OR (EOT 10/25) -Labs today: cbc, cmp, esr and crp -Plan on f/u on 10/25 with ID to evaluate wound at hte end of therapy -Continue to follow with wound care  #Tobacco abuse -Nicorrete gum -Counseled on smoking cessation for wound healing  MLaurice Record MD [facility]   07/03/22  3:08 PM

## 2022-07-04 LAB — COMPLETE METABOLIC PANEL WITH GFR
AG Ratio: 0.5 (calc) — ABNORMAL LOW (ref 1.0–2.5)
ALT: 22 U/L (ref 6–29)
AST: 66 U/L — ABNORMAL HIGH (ref 10–30)
Albumin: 2.6 g/dL — ABNORMAL LOW (ref 3.6–5.1)
Alkaline phosphatase (APISO): 152 U/L — ABNORMAL HIGH (ref 31–125)
BUN/Creatinine Ratio: 7 (calc) (ref 6–22)
BUN: 8 mg/dL (ref 7–25)
CO2: 21 mmol/L (ref 20–32)
Calcium: 8.9 mg/dL (ref 8.6–10.2)
Chloride: 109 mmol/L (ref 98–110)
Creat: 1.21 mg/dL — ABNORMAL HIGH (ref 0.50–0.99)
Globulin: 5.3 g/dL (calc) — ABNORMAL HIGH (ref 1.9–3.7)
Glucose, Bld: 84 mg/dL (ref 65–99)
Potassium: 3.3 mmol/L — ABNORMAL LOW (ref 3.5–5.3)
Sodium: 138 mmol/L (ref 135–146)
Total Bilirubin: 2.6 mg/dL — ABNORMAL HIGH (ref 0.2–1.2)
Total Protein: 7.9 g/dL (ref 6.1–8.1)
eGFR: 57 mL/min/{1.73_m2} — ABNORMAL LOW (ref >=60)

## 2022-07-04 LAB — CBC WITH DIFFERENTIAL/PLATELET
Absolute Monocytes: 590 cells/uL (ref 200–950)
Basophils Absolute: 101 cells/uL (ref 0–200)
Basophils Relative: 1.4 %
Eosinophils Absolute: 209 cells/uL (ref 15–500)
Eosinophils Relative: 2.9 %
HCT: 30.3 % — ABNORMAL LOW (ref 35.0–45.0)
Hemoglobin: 11 g/dL — ABNORMAL LOW (ref 11.7–15.5)
Lymphs Abs: 1822 cells/uL (ref 850–3900)
MCH: 32.4 pg (ref 27.0–33.0)
MCHC: 36.3 g/dL — ABNORMAL HIGH (ref 32.0–36.0)
MCV: 89.4 fL (ref 80.0–100.0)
MPV: 10.6 fL (ref 7.5–12.5)
Monocytes Relative: 8.2 %
Neutro Abs: 4478 cells/uL (ref 1500–7800)
Neutrophils Relative %: 62.2 %
Platelets: 117 10*3/uL — ABNORMAL LOW (ref 140–400)
RBC: 3.39 10*6/uL — ABNORMAL LOW (ref 3.80–5.10)
RDW: 14.3 % (ref 11.0–15.0)
Total Lymphocyte: 25.3 %
WBC: 7.2 10*3/uL (ref 3.8–10.8)

## 2022-07-04 LAB — C-REACTIVE PROTEIN: CRP: 6.7 mg/L (ref <8.0)

## 2022-07-04 LAB — SEDIMENTATION RATE: Sed Rate: 125 mm/h — ABNORMAL HIGH (ref 0–20)

## 2022-07-05 ENCOUNTER — Encounter (HOSPITAL_BASED_OUTPATIENT_CLINIC_OR_DEPARTMENT_OTHER): Payer: Self-pay | Admitting: General Surgery

## 2022-07-05 NOTE — Progress Notes (Signed)
[name] ([id number]) [id number] Page 1 of 10 Visit Report for 07/05/2022 Arrival Information Details Patient Name: Date of Service: [name], [name] 07/05/2022 2:30 PM Medical Record Number: [id number] Patient Account Number: [id number] Date of Birth/Sex: Treating RN: 02/27/1976 (46 y.o. Marta Lamas Primary Care Monnie Gudgel: Juluis Mire Other Clinician: Referring Izaya Netherton: Treating Tollie Canada/Extender: Patrici Ranks in Treatment: 6 Visit Information History Since Last Visit Added or deleted any medications: Yes Patient Arrived: Cane Any new allergies or adverse reactions: No Arrival Time: 14:22 Had a fall or experienced change in No Accompanied By: self activities of daily living that may affect Transfer Assistance: None risk of falls: Patient Identification Verified: Yes Signs or symptoms of abuse/neglect since last visito No Secondary Verification Process Completed: Yes Hospitalized since last visit: Yes Patient Requires Transmission-Based Precautions: No Implantable device outside of the clinic excluding No Patient Has Alerts: No cellular tissue based products placed in the center since last visit: Has Dressing in Place as Prescribed: Yes Pain Present Now: Yes Electronic Signature(s) Signed: 07/05/2022 4:42:36 PM By: Blanche East RN Entered By: Blanche East on 07/05/2022 14:41:01 -------------------------------------------------------------------------------- Encounter Discharge Information Details Patient Name: Date of Service: Lori Hawking R. 07/05/2022 2:30 PM Medical Record Number: [id number] Patient Account Number: [id number] Date of Birth/Sex: Treating RN: 08-Nov-1975 (46 y.o. Marta Lamas Primary Care Council Munguia: Juluis Mire Other Clinician: Referring Chera Slivka: Treating Rhylei Mcquaig/Extender: Patrici Ranks in Treatment: 6 Encounter Discharge Information  Items Post Procedure Vitals Discharge Condition: Stable Temperature (F): 98.6 Ambulatory Status: Cane Pulse (bpm): 121 Discharge Destination: Home Respiratory Rate (breaths/min): 16 Transportation: Private Auto Blood Pressure (mmHg): 114/76 Accompanied By: self Schedule Follow-up Appointment: Yes Clinical Summary of Care: Electronic Signature(s) Signed: 07/05/2022 4:42:36 PM By: Blanche East RN Entered By: Blanche East on 07/05/2022 15:19:52 [name] ([id number]) [id number] Page 2 of 10 -------------------------------------------------------------------------------- Lower Extremity Assessment Details Patient Name: Date of Service: [name] 07/05/2022 2:30 PM Medical Record Number: [id number] Patient Account Number: [id number] Date of Birth/Sex: Treating RN: 12/22/1975 (46 y.o. Marta Lamas Primary Care Yeshua Stryker: Juluis Mire Other Clinician: Referring Carry Ortez: Treating Shirleyann Montero/Extender: Patrici Ranks in Treatment: 6 Edema Assessment Assessed: [Left: No] [Right: No] [Left: Edema] [Right: :] Calf Left: Right: Point of Measurement: From Medial Instep 34 cm Ankle Left: Right: Point of Measurement: From Medial Instep 23.5 cm Vascular Assessment Pulses: Dorsalis Pedis Palpable: [Left:Yes] Electronic Signature(s) Signed: 07/05/2022 4:42:36 PM By: Blanche East RN Entered By: Blanche East on 07/05/2022 14:45:58 -------------------------------------------------------------------------------- Multi Wound Chart Details Patient Name: Date of Service: Lori Hawking R. 07/05/2022 2:30 PM Medical Record Number: [id number] Patient Account Number: [id number] Date of Birth/Sex: Treating RN: [birth year] (46 y.o. F) Primary Care Fadi Menter: Juluis Mire Other Clinician: Referring Remell Giaimo: Treating Evelise Reine/Extender: Patrici Ranks in Treatment: 6 Vital  Signs Height(in): 66 Pulse(bpm): 121 Weight(lbs): 197 Blood Pressure(mmHg): 114/76 Body Mass Index(BMI): 31.8 Temperature(F): 98.6 Respiratory Rate(breaths/min): 16 [1:Photos:] [N/A:N/A [id number] Page 3 of 10] Left, Dorsal Foot Left Gluteal fold N/A Wound Location: Shear/Friction Shear/Friction N/A Wounding Event: Trauma, Other Trauma, Other N/A Primary Etiology: Anemia, Lymphedema, Hypertension, Anemia, Lymphedema, Hypertension, N/A Comorbid History: Hypotension, Cirrhosis Hypotension, Cirrhosis 05/20/2022 03/20/2022 N/A Date Acquired: 6 6 N/A Weeks of Treatment: Open Open N/A Wound Status: No No N/A Wound Recurrence: 3.2x5.5x0.3 6x4x0.3 N/A Measurements L x W x D (cm) 13.823 18.85 N/A A (cm) : rea 4.147 5.655 N/A Volume (cm) : -1855.20% 25.50% N/A % Reduction in A rea: -5740.80%  82.80% N/A % Reduction in Volume: 10 Starting Position 1 (o'clock): 1 Ending Position 1 (o'clock): 1 Maximum Distance 1 (cm): No Yes N/A Undermining: Full Thickness Without Exposed Full Thickness Without Exposed N/A Classification: Support Structures Support Structures Large Medium N/A Exudate A mount: Serosanguineous Serosanguineous N/A Exudate Type: red, brown red, brown N/A Exudate Color: Distinct, outline attached Distinct, outline attached N/A Wound Margin: Medium (34-66%) Large (67-100%) N/A Granulation A mount: Red, Pink Red, Pink N/A Granulation Quality: Medium (34-66%) Small (1-33%) N/A Necrotic A mount: Fat Layer (Subcutaneous Tissue): Yes Fat Layer (Subcutaneous Tissue): Yes N/A Exposed Structures: Fascia: No Fascia: No Tendon: No Tendon: No Muscle: No Muscle: No Joint: No Joint: No Bone: No Bone: No None Small (1-33%) N/A Epithelialization: Debridement - Selective/Open Wound N/A N/A Debridement: Pre-procedure Verification/Time Out 15:02 N/A N/A Taken: Lidocaine 5% topical ointment N/A N/A Pain Control: Other, Slough  N/A N/A Tissue Debrided: Non-Viable Tissue N/A N/A Level: 17.6 N/A N/A Debridement A (sq cm): rea Curette, Forceps, Scissors N/A N/A Instrument: Minimum N/A N/A Bleeding: Pressure N/A N/A Hemostasis A chieved: 0 N/A N/A Procedural Pain: 0 N/A N/A Post Procedural Pain: Procedure was tolerated well N/A N/A Debridement Treatment Response: 3.2x5.5x0.3 N/A N/A Post Debridement Measurements L x W x D (cm) 4.147 N/A N/A Post Debridement Volume: (cm) No Abnormalities Noted No Abnormalities Noted N/A Periwound Skin Texture: No Abnormalities Noted No Abnormalities Noted N/A Periwound Skin Moisture: No Abnormalities Noted No Abnormalities Noted N/A Periwound Skin Color: N/A No Abnormality N/A Temperature: N/A Yes N/A Tenderness on Palpation: Debridement N/A N/A Procedures Performed: Treatment Notes Wound #1 (Foot) Wound Laterality: Dorsal, Left Cleanser Soap and Water Discharge Instruction: May shower and wash wound with dial antibacterial soap and water prior to dressing change. Wound Cleanser Discharge Instruction: Cleanse the wound with wound cleanser prior to applying a clean dressing using gauze sponges, not tissue or cotton balls. Peri-Wound Care Topical Primary Dressing Promogran Prisma Matrix, 4.34 (sq in) (silver collagen) Discharge Instruction: Moisten collagen with saline or hydrogel Secondary Dressing ABD Pad, 5x9 Discharge Instruction: Apply over primary dressing as directed. Woven Gauze Sponge, Non-Sterile 4x4 in Discharge Instruction: Apply over primary dressing as directed. SHONTERIA, ABELN ([id number]) [id number] Page 4 of 10 Secured With Elastic Bandage 4 inch (ACE bandage) Discharge Instruction: Secure with ACE bandage as directed. Kerlix Roll Sterile, 4.5x3.1 (in/yd) Discharge Instruction: Secure with Kerlix as directed. Compression Wrap Compression Stockings Add-Ons Wound #2 (Gluteal fold) Wound Laterality:  Left Cleanser Soap and Water Discharge Instruction: May shower and wash wound with dial antibacterial soap and water prior to dressing change. Wound Cleanser Discharge Instruction: Cleanse the wound with wound cleanser prior to applying a clean dressing using gauze sponges, not tissue or cotton balls. Peri-Wound Care Topical Primary Dressing KerraCel Ag Gelling Fiber Dressing, 4x5 in (silver alginate) Discharge Instruction: Apply silver alginate to wound bed as instructed Secondary Dressing ABD Pad, 5x9 Discharge Instruction: Apply over primary dressing as directed. Woven Gauze Sponge, Non-Sterile 4x4 in Discharge Instruction: Apply over primary dressing as directed. Secured With Compression Wrap Compression Stockings Environmental education officer) Signed: 07/05/2022 3:57:58 PM By: Fredirick Maudlin MD FACS Entered By: Fredirick Maudlin on 07/05/2022 15:57:58 -------------------------------------------------------------------------------- Multi-Disciplinary Care Plan Details Patient Name: Date of Service: Lori Hawking R. 07/05/2022 2:30 PM Medical Record Number: [id number] Patient Account Number: [id number] Date of Birth/Sex: Treating RN: July 08, 1976 (46 y.o. Marta Lamas Primary Care Ahmet Schank: Juluis Mire Other Clinician: Referring Giles Currie: Treating Kaylean Tupou/Extender: Patrici Ranks in Treatment: 6 Multidisciplinary Care  Plan reviewed with physician Active Inactive Pressure Nursing Diagnoses: Knowledge deficit related to causes and risk factors for pressure ulcer development Potential for impaired tissue integrity related to pressure, friction, moisture, and shear Stanback, Brehanna R ([id number]) [id number] Page 5 of 10 Goals: Patient will remain free from development of additional pressure ulcers Date Initiated: 05/24/2022 Date Inactivated: 06/27/2022 Target Resolution Date: 06/30/2022 Goal Status:  Met Patient/caregiver will verbalize risk factors for pressure ulcer development Date Initiated: 05/24/2022 Date Inactivated: 06/27/2022 Target Resolution Date: 06/30/2022 Goal Status: Met Patient/caregiver will verbalize understanding of pressure ulcer management Date Initiated: 06/27/2022 Target Resolution Date: 07/25/2022 Goal Status: Active Interventions: Assess: immobility, friction, shearing, incontinence upon admission and as needed Assess potential for pressure ulcer upon admission and as needed Notes: Wound/Skin Impairment Nursing Diagnoses: Impaired tissue integrity Knowledge deficit related to ulceration/compromised skin integrity Goals: Patient will demonstrate a reduced rate of smoking or cessation of smoking Date Initiated: 05/24/2022 Target Resolution Date: 07/25/2022 Goal Status: Active Patient/caregiver will verbalize understanding of skin care regimen Date Initiated: 05/24/2022 Target Resolution Date: 07/25/2022 Goal Status: Active Ulcer/skin breakdown will have a volume reduction of 50% by week 8 Date Initiated: 06/27/2022 Target Resolution Date: 07/25/2022 Goal Status: Active Interventions: Assess patient/caregiver ability to obtain necessary supplies Assess ulceration(s) every visit Treatment Activities: Skin care regimen initiated : 05/24/2022 Topical wound management initiated : 05/24/2022 Notes: Electronic Signature(s) Signed: 07/05/2022 4:42:36 PM By: Blanche East RN Entered By: Blanche East on 07/05/2022 14:58:15 -------------------------------------------------------------------------------- Pain Assessment Details Patient Name: Date of Service: Lori Hawking R. 07/05/2022 2:30 PM Medical Record Number: [id number] Patient Account Number: [id number] Date of Birth/Sex: Treating RN: 1976-06-14 (46 y.o. Marta Lamas Primary Care Naylene Foell: Juluis Mire Other Clinician: Referring Jimi Schappert: Treating Judeen Geralds/Extender: Patrici Ranks in Treatment: 6 Active Problems Location of Pain Severity and Description of Pain Patient Has Paino Yes Site Locations Rate the pain. MIZANI, DILDAY ([id number]) [id number] Page 6 of 10 Rate the pain. Current Pain Level: 0 Pain Management and Medication Current Pain Management: Electronic Signature(s) Signed: 07/05/2022 4:42:36 PM By: Blanche East RN Entered By: Blanche East on 07/05/2022 14:41:53 -------------------------------------------------------------------------------- Patient/Caregiver Education Details Patient Name: Date of Service: Ernie Hew 10/11/2023andnbsp2:30 PM Medical Record Number: [id number] Patient Account Number: [id number] Date of Birth/Gender: Treating RN: [birth year] (46 y.o. Marta Lamas Primary Care Physician: Juluis Mire Other Clinician: Referring Physician: Treating Physician/Extender: Patrici Ranks in Treatment: 6 Education Assessment Education Provided To: Patient Education Topics Provided Wound/Skin Impairment: Methods: Explain/Verbal Responses: Reinforcements needed, State content correctly Electronic Signature(s) Signed: 07/05/2022 4:42:36 PM By: Blanche East RN Entered By: Blanche East on 07/05/2022 14:58:29 -------------------------------------------------------------------------------- Wound Assessment Details Patient Name: Date of Service: Lori Hawking R. 07/05/2022 2:30 PM Medical Record Number: [id number] Patient Account Number: [id number] Date of Birth/Sex: Treating RN: 1975/11/07 (46 y.o. Marta Lamas Primary Care Chayanne Speir: Juluis Mire Other Clinician: Referring Halden Phegley: Treating Shadaya Marschner/Extender: Ludwig Lean Garnavillo, Arline Asp R ([id number]) [id number] Page 7 of 10 Weeks in Treatment: 6 Wound Status Wound Number: 1 Primary Etiology: Trauma, Other Wound  Location: Left, Dorsal Foot Wound Status: Open Wounding Event: Shear/Friction Comorbid Anemia, Lymphedema, Hypertension, Hypotension, History: Cirrhosis Date Acquired: 05/20/2022 Weeks Of Treatment: 6 Clustered Wound: No Photos Wound Measurements Length: (cm) 3.2 Width: (cm) 5.5 Depth: (cm) 0.3 Area: (cm) 13.823 Volume: (cm) 4.147 % Reduction in Area: -1855.2% % Reduction in Volume: -5740.8% Epithelialization: None Tunneling: No Undermining: No Wound Description Classification: Full Thickness Without Exposed Support Structures Wound Margin: Distinct,  outline attached Exudate Amount: Large Exudate Type: Serosanguineous Exudate Color: red, brown Foul Odor After Cleansing: No Slough/Fibrino Yes Wound Bed Granulation Amount: Medium (34-66%) Exposed Structure Granulation Quality: Red, Pink Fascia Exposed: No Necrotic Amount: Medium (34-66%) Fat Layer (Subcutaneous Tissue) Exposed: Yes Necrotic Quality: Adherent Slough Tendon Exposed: No Muscle Exposed: No Joint Exposed: No Bone Exposed: No Periwound Skin Texture Texture Color No Abnormalities Noted: Yes No Abnormalities Noted: Yes Moisture No Abnormalities Noted: Yes Treatment Notes Wound #1 (Foot) Wound Laterality: Dorsal, Left Cleanser Soap and Water Discharge Instruction: May shower and wash wound with dial antibacterial soap and water prior to dressing change. Wound Cleanser Discharge Instruction: Cleanse the wound with wound cleanser prior to applying a clean dressing using gauze sponges, not tissue or cotton balls. Peri-Wound Care Topical Primary Dressing Promogran Prisma Matrix, 4.34 (sq in) (silver collagen) Discharge Instruction: Moisten collagen with saline or hydrogel Secondary Dressing ABD Pad, 5x9 BARBA, SOLT R ([id number]) [id number].pdf Page 8 of 10 Discharge Instruction: Apply over primary dressing as directed. Woven Gauze Sponge, Non-Sterile 4x4 in Discharge  Instruction: Apply over primary dressing as directed. Secured With Elastic Bandage 4 inch (ACE bandage) Discharge Instruction: Secure with ACE bandage as directed. Kerlix Roll Sterile, 4.5x3.1 (in/yd) Discharge Instruction: Secure with Kerlix as directed. Compression Wrap Compression Stockings Add-Ons Electronic Signature(s) Signed: 07/05/2022 4:42:36 PM By: Blanche East RN Entered By: Blanche East on 07/05/2022 14:55:53 -------------------------------------------------------------------------------- Wound Assessment Details Patient Name: Date of Service: Lori Hawking R. 07/05/2022 2:30 PM Medical Record Number: [id number] Patient Account Number: [id number] Date of Birth/Sex: Treating RN: [birth year] (46 y.o. Iver Nestle, Jamie Primary Care Cissy Galbreath: Juluis Mire Other Clinician: Referring Yoali Conry: Treating Raden Byington/Extender: Patrici Ranks in Treatment: 6 Wound Status Wound Number: 2 Primary Etiology: Trauma, Other Wound Location: Left Gluteal fold Wound Status: Open Wounding Event: Shear/Friction Comorbid Anemia, Lymphedema, Hypertension, Hypotension, History: Cirrhosis Date Acquired: 03/20/2022 Weeks Of Treatment: 6 Clustered Wound: No Photos Wound Measurements Length: (cm) 6 Width: (cm) 4 Depth: (cm) 0.3 Area: (cm) 18.85 Volume: (cm) 5.655 % Reduction in Area: 25.5% % Reduction in Volume: 82.8% Epithelialization: Small (1-33%) Tunneling: No Undermining: Yes Starting Position (o'clock): 10 Ending Position (o'clock): 1 Maximum Distance: (cm) 1 Wound Description Classification: Full Thickness Without Exposed Support Structures Wound Margin: Distinct, outline attached Exudate Amount: Medium Doiron, Shalen R ([id number]) Exudate Type: Serosanguineous Exudate Color: red, brown Foul Odor After Cleansing: No Slough/Fibrino No [id number] Page 9 of 10 Wound Bed Granulation Amount: Large (67-100%)  Exposed Structure Granulation Quality: Red, Pink Fascia Exposed: No Necrotic Amount: Small (1-33%) Fat Layer (Subcutaneous Tissue) Exposed: Yes Necrotic Quality: Adherent Slough Tendon Exposed: No Muscle Exposed: No Joint Exposed: No Bone Exposed: No Periwound Skin Texture Texture Color No Abnormalities Noted: Yes No Abnormalities Noted: Yes Moisture Temperature / Pain No Abnormalities Noted: Yes Temperature: No Abnormality Tenderness on Palpation: Yes Treatment Notes Wound #2 (Gluteal fold) Wound Laterality: Left Cleanser Soap and Water Discharge Instruction: May shower and wash wound with dial antibacterial soap and water prior to dressing change. Wound Cleanser Discharge Instruction: Cleanse the wound with wound cleanser prior to applying a clean dressing using gauze sponges, not tissue or cotton balls. Peri-Wound Care Topical Primary Dressing KerraCel Ag Gelling Fiber Dressing, 4x5 in (silver alginate) Discharge Instruction: Apply silver alginate to wound bed as instructed Secondary Dressing ABD Pad, 5x9 Discharge Instruction: Apply over primary dressing as directed. Woven Gauze Sponge, Non-Sterile 4x4 in Discharge Instruction: Apply over primary dressing as directed. Secured With Compression Wrap Compression Stockings Add-Ons  Electronic Signature(s) Signed: 07/05/2022 4:42:36 PM By: Blanche East RN Entered By: Blanche East on 07/05/2022 14:56:45 -------------------------------------------------------------------------------- Vitals Details Patient Name: Date of Service: Lori Hawking R. 07/05/2022 2:30 PM Medical Record Number: [id number] Patient Account Number: [id number] Date of Birth/Sex: Treating RN: Feb 22, 1976 (46 y.o. Marta Lamas Primary Care Jazia Faraci: Juluis Mire Other Clinician: Referring Brighten Orndoff: Treating Dylin Breeden/Extender: Patrici Ranks in Treatment: 6 Vital Signs Time Taken: 14:41 Temperature (F):  98.6 Jicha, Djeneba R ([id number]) [id number] Page 10 of 10 Height (in): 66 Pulse (bpm): 121 Weight (lbs): 197 Respiratory Rate (breaths/min): 16 Body Mass Index (BMI): 31.8 Blood Pressure (mmHg): 114/76 Reference Range: 80 - 120 mg / dl Electronic Signature(s) Signed: 07/05/2022 4:42:36 PM By: Blanche East RN Entered By: Blanche East on 07/05/2022 14:41:26

## 2022-07-05 NOTE — Progress Notes (Signed)
CORDELL, GOLUB ([id number]) [id number] Page 1 of 9 Visit Report for 07/05/2022 Chief Complaint Document Details Patient Name: Date of Service: [name], [name] 07/05/2022 2:30 PM Medical Record Number: [id number] Patient Account Number: [id number] Date of Birth/Sex: Treating RN: July 06, 1976 (46 y.o. F) Primary Care Provider: Juluis Mire Other Clinician: Referring Provider: Treating Provider/Extender: Patrici Ranks in Treatment: 6 Information Obtained from: Patient Chief Complaint Patient seen for complaints of Non-Healing Wounds. Electronic Signature(s) Signed: 07/05/2022 3:58:03 PM By: Fredirick Maudlin MD FACS Entered By: Fredirick Maudlin on 07/05/2022 15:58:03 -------------------------------------------------------------------------------- Debridement Details Patient Name: Date of Service: Lori Hawking R. 07/05/2022 2:30 PM Medical Record Number: [id number] Patient Account Number: [id number] Date of Birth/Sex: Treating RN: [birth year] (46 y.o. Iver Nestle, Jamie Primary Care Provider: Juluis Mire Other Clinician: Referring Provider: Treating Provider/Extender: Patrici Ranks in Treatment: 6 Debridement Performed for Assessment: Wound #1 Left,Dorsal Foot Performed By: Physician Fredirick Maudlin, MD Debridement Type: Debridement Level of Consciousness (Pre-procedure): Awake and Alert Pre-procedure Verification/Time Out Yes - 15:02 Taken: Start Time: 15:03 Pain Control: Lidocaine 5% topical ointment T Area Debrided (L x W): otal 3.2 (cm) x 5.5 (cm) = 17.6 (cm) Tissue and other material debrided: Non-Viable, Slough, Biofilm, [place], Other: sutures removed Level: Non-Viable Tissue Debridement Description: Selective/Open Wound Instrument: Curette, Forceps, Scissors Bleeding: Minimum Hemostasis Achieved: Pressure Procedural Pain: 0 Post Procedural Pain: 0 Response to  Treatment: Procedure was tolerated well Level of Consciousness (Post- Awake and Alert procedure): Post Debridement Measurements of Total Wound Length: (cm) 3.2 Width: (cm) 5.5 Depth: (cm) 0.3 Volume: (cm) 4.147 Character of Wound/Ulcer Post Debridement: Requires Further Debridement Post Procedure Diagnosis Same as Pre-procedure [name], [name] R ([id number]) [id number] Page 2 of 9 Notes Scribed for Dr. Celine Ahr by Blanche East, RN Electronic Signature(s) Signed: 07/05/2022 4:42:36 PM By: Blanche East RN Signed: 07/05/2022 4:50:41 PM By: Fredirick Maudlin MD FACS Entered By: Blanche East on 07/05/2022 15:05:02 -------------------------------------------------------------------------------- HPI Details Patient Name: Date of Service: Lori Hawking R. 07/05/2022 2:30 PM Medical Record Number: [id number] Patient Account Number: [id number] Date of Birth/Sex: Treating RN: 1976-05-10 (46 y.o. F) Primary Care Provider: Juluis Mire Other Clinician: Referring Provider: Treating Provider/Extender: Patrici Ranks in Treatment: 6 History of Present Illness HPI Description: ADMISSION 05/24/2022 This is a 46 year old woman with a past medical history significant for hypertension, alcoholic hepatitis, chronic anemia, chronic kidney disease, and peptic ulcer disease. She was admitted to the hospital in the middle of July for hypovolemic shock secondary to dehydration from gastroenteritis. She was found on admission to have an ulcer on her left buttock within the confines of the natal cleft and approaching the anal verge. The etiology was felt to be moisture and friction rather than pressure, as the patient is completely ambulatory. No debridement was performed. She was readmitted at the beginning of August with worsening lower extremity edema and fatigue. The wound was basically unchanged. Wound nursing was consulted and they recommended  using silver alginate. She was discharged from the hospital with referral to the wound care center. This past weekend, she noted a wound on her dorsal left foot that she thinks was secondary to friction from shoes. She is here for evaluation of both these wounds. ABI in clinic today was 1.18. She reports that she has quit drinking but she continues to smoke. On the dorsal surface of her left foot between her fourth and fifth metatarsal heads, there is a circular wound exposing the fat layer. There is some slough  and eschar accumulation. It does appear consistent with her history of abrasion. On the medial aspect of her left buttock extending into the natal cleft and towards the anal verge. There is a large ulcer with a very clean surface. There is substantial undermining at the cranial portion of the wound. No purulent drainage or malodor. 06/08/2022: The patient returns today with significant worsening of her dorsal foot wound. Apparently she saw her PCP yesterday who was concerned for cellulitis but did not prescribe any antibiotics. She was also sent for a DVT scan which was negative. There is heavy slough as well as liquefactive necrosis of the fat layer. There is a bulla between the second and third toes that looks as though it may rupture in the near future. The wound undermines under the skin towards this bulla. No frankly purulent drainage or any odor. Her gluteal wound is very clean but is relatively unchanged in size. She has been managing her wounds on her own and finds it difficult to pack the silver alginate into the undermined portion of the buttock wound. She says that she has been putting peroxide on her foot. 06/19/2022: The patient was not seen as a wound care visit last week because she arrived too late to her appointment. She did have a nursing visit, however. The culture that I took on 14 September grew out a polymicrobial population including Bacteroides fragilis Citrobacter  Morganella Escherichia coli Enterococcus faecalis Pseudomonas aeruginosa and many others. I prescribed Bactrim and Augmentin for this polymicrobial infection based upon the resistance genes detected. Keystone topical compounded antibiotic was also ordered and she has been using that on her foot as well. Unfortunately, the wound on her dorsal foot continues to deteriorate. There is a larger area of undermining and more necrotic tissue, including muscle, on the wound surface. No significant malodor or frank pus. Her gluteal wound, on the other hand, looks quite good. It is smaller and very clean and the undermined portion has contracted substantially. 06/27/2022: Last week after our visit, she went to the hospital where she was admitted with sepsis. She underwent surgical debridement of her dorsal foot wound. Cultures taken intraoperatively were negative and a piece of PuraPly was sutured in place. She is currently taking Augmentin as prescribed on discharge. Her gluteal wound continues to contract. It is more superficial and there is even less undermining. She does have some biofilm accumulation on the surface. The dorsal foot wound is stained yellow from the Xeroform that was applied by podiatry. PuraPly is covering about a third of the wound. The rest of the wound surface is very clean. She did have quite a bit of drainage, but it was not purulent or malodorous. 07/05/2022: The wound on her buttocks continues to contract. It is very clean without any accumulation of slough or other debris. The PuraPly that was on her foot came off with her dressing. There are a couple of sutures remaining around the perimeter of the wound. There is slough buildup but no signs of necrosis. Granulation tissue is starting to form. Electronic Signature(s) Signed: 07/05/2022 4:02:59 PM By: Fredirick Maudlin MD FACS Entered By: Fredirick Maudlin on 07/05/2022 16:02:59 [name] ([id number])  [id number] Page 3 of 9 -------------------------------------------------------------------------------- Physical Exam Details Patient Name: Date of Service: [name], [name] 07/05/2022 2:30 PM Medical Record Number: [id number] Patient Account Number: [id number] Date of Birth/Sex: Treating RN: 27-Jun-1976 (46 y.o. F) Primary Care Provider: Juluis Mire Other Clinician: Referring Provider: Treating Provider/Extender: Ludwig Lean  Weeks in Treatment: 6 Constitutional .Tachycardic, asymptomatic.. . . No acute distress.Marland Kitchen Respiratory Normal work of breathing on room air.. Notes 07/05/2022: The wound on her buttocks continues to contract. It is very clean without any accumulation of slough or other debris. The PuraPly that was on her foot came off with her dressing. There are a couple of sutures remaining around the perimeter of the wound. There is slough buildup but no signs of necrosis. Granulation tissue is starting to form. Electronic Signature(s) Signed: 07/05/2022 4:03:35 PM By: Fredirick Maudlin MD FACS Entered By: Fredirick Maudlin on 07/05/2022 16:03:35 -------------------------------------------------------------------------------- Physician Orders Details Patient Name: Date of Service: Lori Hawking R. 07/05/2022 2:30 PM Medical Record Number: [id number] Patient Account Number: [id number] Date of Birth/Sex: Treating RN: 08-28-1976 (45 y.o. Iver Nestle, Jamie Primary Care Provider: Juluis Mire Other Clinician: Referring Provider: Treating Provider/Extender: Patrici Ranks in Treatment: 6 Verbal / Phone Orders: No Diagnosis Coding ICD-10 Coding Code Description [phone] Non-pressure chronic ulcer of other part of left foot with fat layer exposed L98.412 Non-pressure chronic ulcer of buttock with fat layer exposed N18.9 Chronic kidney disease, unspecified [id number] Alcoholic hepatitis  without ascites Follow-up Appointments ppointment in 1 week. - Dr. Celine Ahr - Rm 4 Return A Bathing/ Shower/ Hygiene May shower with protection but do not get wound dressing(s) wet. Edema Control - Lymphedema / SCD / Other Elevate legs to the level of the heart or above for 30 minutes daily and/or when sitting, a frequency of: Avoid standing for long periods of time. Wound Treatment Wound #1 - Foot Wound Laterality: Dorsal, Left Cleanser: Soap and Water Every Other Day/30 Days Discharge Instructions: May shower and wash wound with dial antibacterial soap and water prior to dressing change. Cleanser: Wound Cleanser Every Other Day/30 Days Discharge Instructions: Cleanse the wound with wound cleanser prior to applying a clean dressing using gauze sponges, not tissue or cotton balls. Prim Dressing: Promogran Prisma Matrix, 4.34 (sq in) (silver collagen) Every Other Day/30 Days ary Discharge Instructions: Moisten collagen with saline or hydrogel Secondary Dressing: ABD Pad, 5x9 Every Other Day/30 Days SATIN, SELAN ([id number]) [phone].pdf Page 4 of 9 Discharge Instructions: Apply over primary dressing as directed. Secondary Dressing: Woven Gauze Sponge, Non-Sterile 4x4 in Every Other Day/30 Days Discharge Instructions: Apply over primary dressing as directed. Secured With: Elastic Bandage 4 inch (ACE bandage) Every Other Day/30 Days Discharge Instructions: Secure with ACE bandage as directed. Secured With: [employer], 4.5x3.1 (in/yd) Every Other Day/30 Days Discharge Instructions: Secure with Kerlix as directed. Wound #2 - Gluteal fold Wound Laterality: Left Cleanser: Soap and Water 1 x Per Day/30 Days Discharge Instructions: May shower and wash wound with dial antibacterial soap and water prior to dressing change. Cleanser: Wound Cleanser 1 x Per Day/30 Days Discharge Instructions: Cleanse the wound with wound cleanser prior to applying a clean  dressing using gauze sponges, not tissue or cotton balls. Prim Dressing: KerraCel Ag Gelling Fiber Dressing, 4x5 in (silver alginate) 1 x Per Day/30 Days ary Discharge Instructions: Apply silver alginate to wound bed as instructed Secondary Dressing: ABD Pad, 5x9 1 x Per Day/30 Days Discharge Instructions: Apply over primary dressing as directed. Secondary Dressing: Woven Gauze Sponge, Non-Sterile 4x4 in 1 x Per Day/30 Days Discharge Instructions: Apply over primary dressing as directed. Electronic Signature(s) Signed: 07/05/2022 4:50:41 PM By: Fredirick Maudlin MD FACS Entered By: Fredirick Maudlin on 07/05/2022 16:03:46 -------------------------------------------------------------------------------- Problem List Details Patient Name: Date of Service: Lori Hawking R. 07/05/2022 2:30 PM Medical  Record Number: [id number] Patient Account Number: [id number] Date of Birth/Sex: Treating RN: [birth year] (46 y.o. F) Primary Care Provider: Juluis Mire Other Clinician: Referring Provider: Treating Provider/Extender: Patrici Ranks in Treatment: 6 Active Problems ICD-10 Encounter Code Description Active Date MDM Diagnosis [phone] Non-pressure chronic ulcer of other part of left foot with fat layer exposed 05/24/2022 No Yes L98.412 Non-pressure chronic ulcer of buttock with fat layer exposed 05/24/2022 No Yes N18.9 Chronic kidney disease, unspecified 05/24/2022 No Yes [id number] Alcoholic hepatitis without ascites 05/24/2022 No Yes Inactive Problems Resolved Problems [name], [name] ([id number]) [id number] Page 5 of 9 Electronic Signature(s) Signed: 07/05/2022 3:57:49 PM By: Fredirick Maudlin MD FACS Entered By: Fredirick Maudlin on 07/05/2022 15:57:48 -------------------------------------------------------------------------------- Progress Note Details Patient Name: Date of Service: Lori Hawking R. 07/05/2022 2:30 PM Medical  Record Number: [id number] Patient Account Number: [id number] Date of Birth/Sex: Treating RN: 07/11/1976 (46 y.o. F) Primary Care Provider: Juluis Mire Other Clinician: Referring Provider: Treating Provider/Extender: Patrici Ranks in Treatment: 6 Subjective Chief Complaint Information obtained from Patient Patient seen for complaints of Non-Healing Wounds. History of Present Illness (HPI) ADMISSION 05/24/2022 This is a 46 year old woman with a past medical history significant for hypertension, alcoholic hepatitis, chronic anemia, chronic kidney disease, and peptic ulcer disease. She was admitted to the hospital in the middle of July for hypovolemic shock secondary to dehydration from gastroenteritis. She was found on admission to have an ulcer on her left buttock within the confines of the natal cleft and approaching the anal verge. The etiology was felt to be moisture and friction rather than pressure, as the patient is completely ambulatory. No debridement was performed. She was readmitted at the beginning of August with worsening lower extremity edema and fatigue. The wound was basically unchanged. Wound nursing was consulted and they recommended using silver alginate. She was discharged from the hospital with referral to the wound care center. This past weekend, she noted a wound on her dorsal left foot that she thinks was secondary to friction from shoes. She is here for evaluation of both these wounds. ABI in clinic today was 1.18. She reports that she has quit drinking but she continues to smoke. On the dorsal surface of her left foot between her fourth and fifth metatarsal heads, there is a circular wound exposing the fat layer. There is some slough and eschar accumulation. It does appear consistent with her history of abrasion. On the medial aspect of her left buttock extending into the natal cleft and towards the anal verge. There is a large ulcer with  a very clean surface. There is substantial undermining at the cranial portion of the wound. No purulent drainage or malodor. 06/08/2022: The patient returns today with significant worsening of her dorsal foot wound. Apparently she saw her PCP yesterday who was concerned for cellulitis but did not prescribe any antibiotics. She was also sent for a DVT scan which was negative. There is heavy slough as well as liquefactive necrosis of the fat layer. There is a bulla between the second and third toes that looks as though it may rupture in the near future. The wound undermines under the skin towards this bulla. No frankly purulent drainage or any odor. Her gluteal wound is very clean but is relatively unchanged in size. She has been managing her wounds on her own and finds it difficult to pack the silver alginate into the undermined portion of the buttock wound. She says that she has been putting peroxide on  her foot. 06/19/2022: The patient was not seen as a wound care visit last week because she arrived too late to her appointment. She did have a nursing visit, however. The culture that I took on 14 September grew out a polymicrobial population including Bacteroides fragilis Citrobacter Morganella Escherichia coli Enterococcus faecalis Pseudomonas aeruginosa and many others. I prescribed Bactrim and Augmentin for this polymicrobial infection based upon the resistance genes detected. Keystone topical compounded antibiotic was also ordered and she has been using that on her foot as well. Unfortunately, the wound on her dorsal foot continues to deteriorate. There is a larger area of undermining and more necrotic tissue, including muscle, on the wound surface. No significant malodor or frank pus. Her gluteal wound, on the other hand, looks quite good. It is smaller and very clean and the undermined portion has contracted substantially. 06/27/2022: Last week after our visit, she went to the hospital where she was  admitted with sepsis. She underwent surgical debridement of her dorsal foot wound. Cultures taken intraoperatively were negative and a piece of PuraPly was sutured in place. She is currently taking Augmentin as prescribed on discharge. Her gluteal wound continues to contract. It is more superficial and there is even less undermining. She does have some biofilm accumulation on the surface. The dorsal foot wound is stained yellow from the Xeroform that was applied by podiatry. PuraPly is covering about a third of the wound. The rest of the wound surface is very clean. She did have quite a bit of drainage, but it was not purulent or malodorous. 07/05/2022: The wound on her buttocks continues to contract. It is very clean without any accumulation of slough or other debris. The PuraPly that was on her foot came off with her dressing. There are a couple of sutures remaining around the perimeter of the wound. There is slough buildup but no signs of necrosis. Granulation tissue is starting to form. Patient History Information obtained from Patient. Family History Cancer - Mother, Diabetes - Mother, Hypertension - Mother,Maternal Grandparents,Paternal Grandparents, No family history of Heart Disease, Hereditary Spherocytosis, Kidney Disease, Lung Disease, Seizures, Stroke, Thyroid Problems, Tuberculosis. Social History Current every day smoker - .5 pack a day, Marital Status - Single, Alcohol Use - Never, Drug Use - No History, Caffeine Use - Daily. Medical History Hematologic/Lymphatic Patient has history of Anemia, Lymphedema Cardiovascular [name], [name] R ([id number]) [id number] Page 6 of 9 Patient has history of Hypertension, Hypotension Gastrointestinal Patient has history of Cirrhosis Endocrine Denies history of Type I Diabetes, Type II Diabetes Hospitalization/Surgery History - esophagogastroduodenoscopy. - biopsy. - cholecystectomy. - breast biopsy. -  abdominoplasty. - tummy tuck. - hernia repair. - roux-en-y procedure. - tumor removal. - wrist ganglion incision. Medical A Surgical History Notes nd Gastrointestinal slow GI blood loss, alcoholic hepatitis Genitourinary CKD stage III Psychiatric depression Objective Constitutional Tachycardic, asymptomatic.Marland Kitchen No acute distress.. Vitals Time Taken: 2:41 PM, Height: 66 in, Weight: 197 lbs, BMI: 31.8, Temperature: 98.6 F, Pulse: 121 bpm, Respiratory Rate: 16 breaths/min, Blood Pressure: 114/76 mmHg. Respiratory Normal work of breathing on room air.. General Notes: 07/05/2022: The wound on her buttocks continues to contract. It is very clean without any accumulation of slough or other debris. The PuraPly that was on her foot came off with her dressing. There are a couple of sutures remaining around the perimeter of the wound. There is slough buildup but no signs of necrosis. Granulation tissue is starting to form. Integumentary (Hair, Skin) Wound #1 status is Open. Original cause of  wound was Shear/Friction. The date acquired was: 05/20/2022. The wound has been in treatment 6 weeks. The wound is located on the Left,Dorsal Foot. The wound measures 3.2cm length x 5.5cm width x 0.3cm depth; 13.823cm^2 area and 4.147cm^3 volume. There is Fat Layer (Subcutaneous Tissue) exposed. There is no tunneling or undermining noted. There is a large amount of serosanguineous drainage noted. The wound margin is distinct with the outline attached to the wound base. There is medium (34-66%) red, pink granulation within the wound bed. There is a medium (34-66%) amount of necrotic tissue within the wound bed including Adherent Slough. The periwound skin appearance had no abnormalities noted for texture. The periwound skin appearance had no abnormalities noted for moisture. The periwound skin appearance had no abnormalities noted for color. Wound #2 status is Open. Original cause of wound was Shear/Friction. The  date acquired was: 03/20/2022. The wound has been in treatment 6 weeks. The wound is located on the Left Gluteal fold. The wound measures 6cm length x 4cm width x 0.3cm depth; 18.85cm^2 area and 5.655cm^3 volume. There is Fat Layer (Subcutaneous Tissue) exposed. There is no tunneling noted, however, there is undermining starting at 10:00 and ending at 1:00 with a maximum distance of 1cm. There is a medium amount of serosanguineous drainage noted. The wound margin is distinct with the outline attached to the wound base. There is large (67-100%) red, pink granulation within the wound bed. There is a small (1-33%) amount of necrotic tissue within the wound bed including Adherent Slough. The periwound skin appearance had no abnormalities noted for texture. The periwound skin appearance had no abnormalities noted for moisture. The periwound skin appearance had no abnormalities noted for color. Periwound temperature was noted as No Abnormality. The periwound has tenderness on palpation. 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 There was a Selective/Open Wound Non-Viable Tissue Debridement with a total area of 17.6 sq cm performed by Fredirick Maudlin, MD. With the following instrument(s): Curette, Forceps, and Scissors to remove Non-Viable tissue/material. Material removed includes Slough, Biofilm, and Other: sutures removed after achieving pain control using Lidocaine 5% topical ointment. No specimens were taken. A time out was conducted at 15:02, prior to the start of the procedure. A Minimum amount of bleeding was controlled with Pressure. The procedure was tolerated well with a pain level of 0  throughout and a pain level of 0 following the procedure. Post Debridement Measurements: 3.2cm length x 5.5cm width x 0.3cm depth; 4.147cm^3 volume. [name], [name] ([id number]) [id number] Page 7 of 9 Character of Wound/Ulcer Post Debridement requires further debridement. Post procedure Diagnosis Wound #1: Same as Pre-Procedure General Notes: Scribed for Dr. Celine Ahr by Blanche East, RN. Plan Follow-up Appointments: Return Appointment in 1 week. - Dr. Celine Ahr - Rm 4 Bathing/ Shower/ Hygiene: May shower with protection but do not get wound dressing(s) wet. Edema Control - Lymphedema / SCD / Other: Elevate legs to the level of the heart or above for 30 minutes daily and/or when sitting, a frequency of: Avoid standing for long periods of time. WOUND #1: - Foot Wound Laterality: Dorsal, Left Cleanser: Soap and Water Every Other Day/30 Days Discharge Instructions: May shower and wash wound with dial antibacterial soap and water prior to dressing change.  Cleanser: Wound Cleanser Every Other Day/30 Days Discharge Instructions: Cleanse the wound with wound cleanser prior to applying a clean dressing using gauze sponges, not tissue or cotton balls. Prim Dressing: Promogran Prisma Matrix, 4.34 (sq in) (silver collagen) Every Other Day/30 Days ary Discharge Instructions: Moisten collagen with saline or hydrogel Secondary Dressing: ABD Pad, 5x9 Every Other Day/30 Days Discharge Instructions: Apply over primary dressing as directed. Secondary Dressing: Woven Gauze Sponge, Non-Sterile 4x4 in Every Other Day/30 Days Discharge Instructions: Apply over primary dressing as directed. Secured With: Elastic Bandage 4 inch (ACE bandage) Every Other Day/30 Days Discharge Instructions: Secure with ACE bandage as directed. Secured With: [employer], 4.5x3.1 (in/yd) Every Other Day/30 Days Discharge Instructions: Secure with Kerlix as directed. WOUND #2: - Gluteal fold Wound  Laterality: Left Cleanser: Soap and Water 1 x Per Day/30 Days Discharge Instructions: May shower and wash wound with dial antibacterial soap and water prior to dressing change. Cleanser: Wound Cleanser 1 x Per Day/30 Days Discharge Instructions: Cleanse the wound with wound cleanser prior to applying a clean dressing using gauze sponges, not tissue or cotton balls. Prim Dressing: KerraCel Ag Gelling Fiber Dressing, 4x5 in (silver alginate) 1 x Per Day/30 Days ary Discharge Instructions: Apply silver alginate to wound bed as instructed Secondary Dressing: ABD Pad, 5x9 1 x Per Day/30 Days Discharge Instructions: Apply over primary dressing as directed. Secondary Dressing: Woven Gauze Sponge, Non-Sterile 4x4 in 1 x Per Day/30 Days Discharge Instructions: Apply over primary dressing as directed. 07/05/2022: The wound on her buttocks continues to contract. It is very clean without any accumulation of slough or other debris. The PuraPly that was on her foot came off with her dressing. There are a couple of sutures remaining around the perimeter of the wound. There is slough buildup but no signs of necrosis. Granulation tissue is starting to form. No debridement was necessary for the buttock wound. I used a curette to debride slough from the dorsal foot wound. We will continue silver alginate to the buttock site and silver collagen to the dorsal foot. Follow-up in 1 week. Electronic Signature(s) Signed: 07/05/2022 4:05:55 PM By: Fredirick Maudlin MD FACS Entered By: Fredirick Maudlin on 07/05/2022 16:05:55 -------------------------------------------------------------------------------- HxROS Details Patient Name: Date of Service: Lori Hawking R. 07/05/2022 2:30 PM Medical Record Number: [id number] Patient Account Number: [id number] Date of Birth/Sex: Treating RN: [birth year] (46 y.o. F) Primary Care Provider: Juluis Mire Other Clinician: Referring Provider: Treating Provider/Extender:  Patrici Ranks in Treatment: 6 Information Obtained From Patient Hematologic/Lymphatic Medical History: Positive for: Anemia; Lymphedema [name], [name] ([id number]) [phone].pdf Page 8 of 9 Cardiovascular Medical History: Positive for: Hypertension; Hypotension Gastrointestinal Medical History: Positive for: Cirrhosis Past Medical History Notes: slow GI blood loss, alcoholic hepatitis Endocrine Medical History: Negative for: Type I Diabetes; Type II Diabetes Genitourinary Medical History: Past Medical History Notes: CKD stage III Psychiatric Medical History: Past Medical History Notes: depression Immunizations Pneumococcal Vaccine: Received Pneumococcal Vaccination: No Implantable Devices None Hospitalization / Surgery History Type of Hospitalization/Surgery esophagogastroduodenoscopy biopsy cholecystectomy breast biopsy abdominoplasty tummy tuck hernia repair roux-en-y procedure tumor removal wrist ganglion incision Family and Social History Cancer: Yes - Mother; Diabetes: Yes - Mother; Heart Disease: No; Hereditary Spherocytosis: No; Hypertension: Yes - Mother,Maternal Grandparents,Paternal Grandparents; Kidney Disease: No; Lung Disease: No; Seizures: No; Stroke: No; Thyroid Problems: No; Tuberculosis: No; Current every day smoker - .5 pack a day; Marital Status - Single; Alcohol Use: Never; Drug Use: No History; Caffeine Use: Daily; Financial Concerns: Yes;  Food, Games developer or Shelter Needs: No; Support System Lacking: No; Transportation Concerns: No Engineer, maintenance) Signed: 07/05/2022 4:50:41 PM By: Fredirick Maudlin MD FACS Entered By: Fredirick Maudlin on 07/05/2022 16:03:09 -------------------------------------------------------------------------------- SuperBill Details Patient Name: Date of Service: Lori Hawking R. 07/05/2022 Medical Record Number: [id number] Patient Account Number:  [id number] Date of Birth/Sex: Treating RN: 1975-12-11 (46 y.o. F) Primary Care Provider: Juluis Mire Other Clinician: Referring Provider: Treating Provider/Extender: Ludwig Lean [place], [place] R ([id number]) [id number] Page 9 of 9 Weeks in Treatment: 6 Diagnosis Coding ICD-10 Codes Code Description [id number] Non-pressure chronic ulcer of other part of left foot with fat layer exposed L98.412 Non-pressure chronic ulcer of buttock with fat layer exposed N18.9 Chronic kidney disease, unspecified 123XX123 Alcoholic hepatitis without ascites Facility Procedures : CPT4 Code: [id number] Description: [id number] - DEBRIDE WOUND 1ST 20 SQ CM OR < ICD-10 Diagnosis Description L97.522 Non-pressure chronic ulcer of other part of left foot with fat layer exposed Modifier: Quantity: 1 Physician Procedures : CPT4 Code Description Modifier [id number] - WC PHYS LEVEL 3 - EST PT 25 ICD-10 Diagnosis Description L97.522 Non-pressure chronic ulcer of other part of left foot with fat layer exposed L98.412 Non-pressure chronic ulcer of buttock with fat layer  exposed [id number] Alcoholic hepatitis without ascites N18.9 Chronic kidney disease, unspecified Quantity: 1 : [id number] - WC PHYS DEBR WO ANESTH 20 SQ CM ICD-10 Diagnosis Description L97.522 Non-pressure chronic ulcer of other part of left foot with fat layer exposed Quantity: 1 Electronic Signature(s) Signed: 07/05/2022 4:06:12 PM By: Fredirick Maudlin MD FACS Entered By: Fredirick Maudlin on 07/05/2022 16:06:12

## 2022-07-12 ENCOUNTER — Other Ambulatory Visit: Payer: Self-pay | Admitting: Hematology and Oncology

## 2022-07-12 ENCOUNTER — Inpatient Hospital Stay: Payer: Self-pay | Attending: Hematology and Oncology

## 2022-07-12 ENCOUNTER — Inpatient Hospital Stay (HOSPITAL_BASED_OUTPATIENT_CLINIC_OR_DEPARTMENT_OTHER): Payer: Self-pay | Admitting: Hematology and Oncology

## 2022-07-12 VITALS — BP 121/76 | HR 86 | Temp 98.3°F | Resp 16 | Wt 186.2 lb

## 2022-07-12 DIAGNOSIS — D508 Other iron deficiency anemias: Secondary | ICD-10-CM

## 2022-07-12 DIAGNOSIS — Z79899 Other long term (current) drug therapy: Secondary | ICD-10-CM | POA: Insufficient documentation

## 2022-07-12 DIAGNOSIS — D509 Iron deficiency anemia, unspecified: Secondary | ICD-10-CM | POA: Insufficient documentation

## 2022-07-12 DIAGNOSIS — E538 Deficiency of other specified B group vitamins: Secondary | ICD-10-CM

## 2022-07-12 DIAGNOSIS — F17210 Nicotine dependence, cigarettes, uncomplicated: Secondary | ICD-10-CM | POA: Insufficient documentation

## 2022-07-12 LAB — IRON AND IRON BINDING CAPACITY (CC-WL,HP ONLY)
Iron: 97 ug/dL (ref 28–170)
Saturation Ratios: 94 % — ABNORMAL HIGH (ref 10.4–31.8)
TIBC: 104 ug/dL — ABNORMAL LOW (ref 250–450)
UIBC: 7 ug/dL — ABNORMAL LOW (ref 148–442)

## 2022-07-12 LAB — RETIC PANEL
Immature Retic Fract: 7 % (ref 2.3–15.9)
RBC.: 3.02 MIL/uL — ABNORMAL LOW (ref 3.87–5.11)
Retic Count, Absolute: 69.8 10*3/uL (ref 19.0–186.0)
Retic Ct Pct: 2.3 % (ref 0.4–3.1)
Reticulocyte Hemoglobin: 36 pg (ref >27.9)

## 2022-07-12 LAB — CMP (CANCER CENTER ONLY)
ALT: 19 U/L (ref 0–44)
AST: 54 U/L — ABNORMAL HIGH (ref 15–41)
Albumin: 2.6 g/dL — ABNORMAL LOW (ref 3.5–5.0)
Alkaline Phosphatase: 167 U/L — ABNORMAL HIGH (ref 38–126)
Anion gap: 5 (ref 5–15)
BUN: 9 mg/dL (ref 6–20)
CO2: 24 mmol/L (ref 22–32)
Calcium: 9 mg/dL (ref 8.9–10.3)
Chloride: 107 mmol/L (ref 98–111)
Creatinine: 1.33 mg/dL — ABNORMAL HIGH (ref 0.44–1.00)
GFR, Estimated: 51 mL/min — ABNORMAL LOW (ref >60)
Glucose, Bld: 88 mg/dL (ref 70–99)
Potassium: 3.7 mmol/L (ref 3.5–5.1)
Sodium: 136 mmol/L (ref 135–145)
Total Bilirubin: 3.3 mg/dL — ABNORMAL HIGH (ref 0.3–1.2)
Total Protein: 7.8 g/dL (ref 6.5–8.1)

## 2022-07-12 LAB — CBC WITH DIFFERENTIAL (CANCER CENTER ONLY)
Abs Immature Granulocytes: 0.01 10*3/uL (ref 0.00–0.07)
Basophils Absolute: 0.1 10*3/uL (ref 0.0–0.1)
Basophils Relative: 1 %
Eosinophils Absolute: 0.2 10*3/uL (ref 0.0–0.5)
Eosinophils Relative: 2 %
HCT: 31.9 % — ABNORMAL LOW (ref 36.0–46.0)
Hemoglobin: 10.7 g/dL — ABNORMAL LOW (ref 12.0–15.0)
Immature Granulocytes: 0 %
Lymphocytes Relative: 24 %
Lymphs Abs: 1.5 10*3/uL (ref 0.7–4.0)
MCH: 32.8 pg (ref 26.0–34.0)
MCHC: 33.5 g/dL (ref 30.0–36.0)
MCV: 97.9 fL (ref 80.0–100.0)
Monocytes Absolute: 0.7 10*3/uL (ref 0.1–1.0)
Monocytes Relative: 11 %
Neutro Abs: 3.9 10*3/uL (ref 1.7–7.7)
Neutrophils Relative %: 62 %
Platelet Count: 101 10*3/uL — ABNORMAL LOW (ref 150–400)
RBC: 3.26 MIL/uL — ABNORMAL LOW (ref 3.87–5.11)
RDW: 17.1 % — ABNORMAL HIGH (ref 11.5–15.5)
WBC Count: 6.3 10*3/uL (ref 4.0–10.5)
nRBC: 0 % (ref 0.0–0.2)

## 2022-07-12 LAB — FERRITIN: Ferritin: 764 ng/mL — ABNORMAL HIGH (ref 11–307)

## 2022-07-12 MED ORDER — ONDANSETRON HCL 8 MG PO TABS: 8.0000 mg | ORAL_TABLET | Freq: Three times a day (TID) | ORAL | 0 refills | Status: DC | PRN

## 2022-07-12 NOTE — Progress Notes (Signed)
[facility] [phone]   [phone]  PROGRESS NOTE  Patient Care Team: Kerin Perna, NP as PCP - General (Internal Medicine) Sueanne Margarita, MD as PCP - Cardiology (Cardiology)  Hematological/Oncological History 1) Labs from [facility], Dr. Cristie Hem, from [place]: -11/16/2020: WBC 6.22, Hgb 11.1 (L), MCV 117.7 (H), Plt 172, Ferritin 259, TIBC 231 (L), Iron 162, Iron saturation 70%, Vitamin B12 423, Folate <2.0 (L) -06/23/2021: WBC 6.32, Hhb 11.3 (L), MCV 93.3, Plt 215,TIBC 503 (H), Iron 14 (L), Iron saturation 3% (L), Vitamin B12 378, Folate >20.0  2) 07/06/2021: Establish care with [facility] Hematology/Oncology  3) 07/21/2021: Received IV monoferric 1000 mg x 1 dose.   CHIEF COMPLAINTS/PURPOSE OF CONSULTATION:  "Iron deficiency anemia "  HISTORY OF PRESENTING ILLNESS:  [name] 46 y.o. female returns for a follow up for iron deficiency anemia.  She is unaccompanied for this visit.  At today's visit, [name] reports that she has recently been in the hospital due to a worsening foot wound.  She developed a spider bite on her foot which got progressively worse.  She was admitted from 06/20/2022 until 06/23/2022 she reports her energy levels are quite low when she feels sleepy and fatigued.  She reports that she is currently taking p.o. ferrous sulfate and tolerating it well.  She is not having any overt signs of bleeding such as bleeding, bruising, or dark stools.  She does that she is not eating particular well as she gets full fast.  She is having some trouble with nausea but no vomiting or diarrhea. She denies any other signs of bleeding. Patient denies any fevers, chills, night sweats, shortness of breath or chest pain. She has no other complaints. Rest of the 10 point ROS is below.   MEDICAL HISTORY:  Past Medical History:  Diagnosis Date   Acid reflux    Anemia    Ulcer     SURGICAL HISTORY: Past Surgical History:   Procedure Laterality Date   ABDOMINAL SURGERY     abdominoplasty   BIOPSY  04/18/2022   Procedure: BIOPSY;  Surgeon: Jackquline Denmark, MD;  Location: [facility];  Service: Gastroenterology;;   BREAST BIOPSY Right 2013   CHOLECYSTECTOMY N/A 11/23/2019   Procedure: LAPAROSCOPIC CHOLECYSTECTOMY WITH INTRAOPERATIVE CHOLANGIOGRAM;  Surgeon: Mickeal Skinner, MD;  Location: [facility];  Service: General;  Laterality: N/A;   COSMETIC SURGERY     tummy tuck   ESOPHAGOGASTRODUODENOSCOPY (EGD) WITH PROPOFOL N/A 04/18/2022   Procedure: ESOPHAGOGASTRODUODENOSCOPY (EGD) WITH PROPOFOL;  Surgeon: Jackquline Denmark, MD;  Location: [facility];  Service: Gastroenterology;  Laterality: N/A;   HERNIA REPAIR     I & D EXTREMITY Left 06/21/2022   Procedure: IRRIGATION AND DEBRIDEMENT EXTREMITY Debridement of Left Foot/APPLICATION OF GRAFT;  Surgeon: Yevonne Pax, DPM;  Location: [facility];  Service: Podiatry;  Laterality: Left;   ROUX-EN-Y PROCEDURE     TUMOR REMOVAL     WRIST GANGLION EXCISION      SOCIAL HISTORY: Social History   Socioeconomic History   Marital status: Single    Spouse name: Not on file   Number of children: 1   Years of education: Not on file   Highest education level: Not on file  Occupational History    Employer: [employer]    Comment: [employer]  Tobacco Use   Smoking status: Every Day    Packs/day: 0.25    Years: 25.00    Total pack years: 6.25  Types: Cigarettes    Passive exposure: Current   Smokeless tobacco: Never  Vaping Use   Vaping Use: Never used  Substance and Sexual Activity   Alcohol use: Not Currently    Comment: previously consumed 1-2 glasses of wine per night   Drug use: No   Sexual activity: Not on file  Other Topics Concern   Not on file  Social History Narrative   Not on file   Social Determinants of Health   Financial Resource Strain: Not on file  Food Insecurity: No Food Insecurity (06/20/2022)   Hunger Vital Sign    Worried About Running Out of  Food in the Last Year: Never true    Ran Out of Food in the Last Year: Never true  Transportation Needs: No Transportation Needs (06/20/2022)   PRAPARE - Hydrologist (Medical): No    Lack of Transportation (Non-Medical): No  Physical Activity: Not on file  Stress: Not on file  Social Connections: Not on file  Intimate Partner Violence: Not At Risk (06/20/2022)   Humiliation, Afraid, Rape, and Kick questionnaire    Fear of Current or Ex-Partner: No    Emotionally Abused: No    Physically Abused: No    Sexually Abused: No    FAMILY HISTORY: Family History  Problem Relation Age of Onset   Cancer Mother        kidney   Cancer Paternal Aunt    Breast cancer Maternal Aunt    Pancreatic cancer Maternal Aunt    Lung cancer Paternal Uncle     ALLERGIES:  is allergic to tape.  MEDICATIONS:  Current Outpatient Medications  Medication Sig Dispense Refill   amoxicillin-clavulanate (AUGMENTIN) 875-125 MG tablet Take 1 tablet by mouth 2 (two) times daily for 27 days. 54 tablet 0   busPIRone (BUSPAR) 15 MG tablet Take 15 mg by mouth daily as needed (anxiety).     Cyanocobalamin (B-12 PO) Take 1 tablet by mouth daily.     doxycycline (VIBRA-TABS) 100 MG tablet Take 1 tablet (100 mg total) by mouth 2 (two) times daily with a meal. 40 tablet 0   ferrous sulfate 325 (65 FE) MG tablet Take 1 tablet (325 mg total) by mouth daily with breakfast. 90 tablet 1   folic acid (FOLVITE) 1 MG tablet Take 1 tablet (1 mg total) by mouth daily. 30 tablet 6   furosemide (LASIX) 20 MG tablet Take 1 tablet (20 mg total) by mouth daily. 30 tablet 11   lactulose (CHRONULAC) 10 GM/15ML solution Take 30 mLs (20 g total) by mouth 3 (three) times daily as needed for mild constipation or moderate constipation. 473 mL 1   loperamide (IMODIUM) 2 MG capsule Take 1 capsule (2 mg total) by mouth as needed for diarrhea or loose stools. 30 capsule 0   midodrine (PROAMATINE) 10 MG tablet Take 1  tablet (10 mg total) by mouth 3 (three) times daily with meals. 45 tablet 0   Multiple Vitamin (MULTIVITAMIN) tablet Take 1 tablet by mouth daily.     nicotine polacrilex (NICORETTE) 2 MG gum Take 1 each (2 mg total) by mouth as needed for smoking cessation. 100 tablet 0   ondansetron (ZOFRAN-ODT) 8 MG disintegrating tablet Take 1 tablet (8 mg total) by mouth every 8 (eight) hours as needed for nausea or vomiting. 30 tablet 1   pantoprazole (PROTONIX) 40 MG tablet Take 1 tablet (40 mg total) by mouth 2 (two) times daily before a meal. 60  tablet 1   sertraline (ZOLOFT) 50 MG tablet Take 50 mg by mouth daily.     spironolactone (ALDACTONE) 50 MG tablet Take 1 tablet (50 mg total) by mouth daily. 30 tablet 1   No current facility-administered medications for this visit.    REVIEW OF SYSTEMS:   Constitutional: ( - ) fevers, ( - )  chills , ( - ) night sweats Eyes: ( - ) blurriness of vision, ( - ) double vision, ( - ) watery eyes Ears, nose, mouth, throat, and face: ( - ) mucositis, ( - ) sore throat Respiratory: ( - ) cough, ( - ) dyspnea, ( - ) wheezes Cardiovascular: ( - ) palpitation, ( - ) chest discomfort, ( - ) lower extremity swelling Gastrointestinal:  ( - ) nausea, ( - ) heartburn, ( - ) change in bowel habits Skin: ( - ) abnormal skin rashes Lymphatics: ( - ) new lymphadenopathy, ( - ) easy bruising Neurological: ( - ) numbness, ( - ) tingling, ( - ) new weaknesses Behavioral/Psych: ( - ) mood change, ( - ) new changes  All other systems were reviewed with the patient and are negative.  PHYSICAL EXAMINATION: ECOG PERFORMANCE STATUS:  1 - Symptomatic but completely ambulatory Vitals:   07/12/22 1210  BP: 121/76  Pulse: 86  Resp: 16  Temp: 98.3 F (36.8 C)  SpO2: 99%    Filed Weights   07/12/22 1210  Weight: 186 lb 3.2 oz (84.5 kg)    GENERAL: well appearing female in NAD  SKIN: skin color, texture, turgor are normal, no rashes or significant lesions EYES: conjunctiva  are pink and non-injected, sclera clear OROPHARYNX: no exudate, no erythema; lips, buccal mucosa, and tongue normal  NECK: supple, non-tender LUNGS: clear to auscultation and percussion with normal breathing effort HEART: regular rhythm but tachycardic. No murmurs and no lower extremity edema ABDOMEN: soft, non-tender, non-distended, normal bowel sounds Musculoskeletal: no cyanosis of digits and no clubbing  PSYCH: alert & oriented x 3, fluent speech NEURO: no focal motor/sensory deficits  LABORATORY DATA:  I have reviewed the data as listed    Latest Ref Rng & Units 07/03/2022    3:50 PM 06/23/2022    4:56 PM 06/23/2022    5:15 AM  CBC  WBC 3.8 - 10.8 Thousand/uL 7.2   7.2   Hemoglobin 11.7 - 15.5 g/dL 11.0  8.7  7.0   Hematocrit 35.0 - 45.0 % 30.3  27.0  22.3   Platelets 140 - 400 Thousand/uL 117   74        Latest Ref Rng & Units 07/03/2022    3:50 PM 06/23/2022    5:15 AM 06/22/2022    4:57 AM  CMP  Glucose 65 - 99 mg/dL 84  77  81   BUN 7 - 25 mg/dL 8  8  8    Creatinine 0.50 - 0.99 mg/dL 1.21  1.35  1.49   Sodium 135 - 146 mmol/L 138  136  136   Potassium 3.5 - 5.3 mmol/L 3.3  3.7  3.7   Chloride 98 - 110 mmol/L 109  112  111   CO2 20 - 32 mmol/L 21  19  19    Calcium 8.6 - 10.2 mg/dL 8.9  8.1  8.1   Total Protein 6.1 - 8.1 g/dL 7.9   5.8   Total Bilirubin 0.2 - 1.2 mg/dL 2.6   2.4   Alkaline Phos 38 - 126 U/L   139  AST 10 - 30 U/L 66   65   ALT 6 - 29 U/L 22   24     ASSESSMENT & PLAN [name] is a 46 y.o. female returns for a follow up for iron deficiency anemia.   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 --Patient received  IV monoferric x 1 dose on 07/21/2021 repletion.  --Labs today show white blood cell 6.3, hemoglobin 10.7, MCV 97.9, and platelets 101. --No additional IV iron is needed at this time.  This is  because her inflammatory markers are elevated and her iron levels appear to be within normal limits. -- Return to clinic in 6 months time to reevaluate.  #Vitamin B12 deficiency: --Most recently received vitamin B12 injections in January 2023 --Vitamin B12 level was WNL at last check.  --Okay to monitor for now.   #Folate deficiency: --continue folic acid 1 mg once daily.   Leukocytosis/neutrophilia: --Levels oscillate. WBC 6.3 today.  --One possible etiology is cigarette smoking. Encouraged to discontinue --Continue to monitor.   Follow up: --RTC in 6 months with repeat labs  No orders of the defined types were placed in this encounter.   All questions were answered. The patient knows to call the clinic with any problems, questions or concerns.  I have spent a total of 30 minutes minutes of face-to-face and non-face-to-face time, preparing to see the patient, performing a medically appropriate examination, counseling and educating the patient, documenting clinical information in the electronic health record, and care coordination.   Ledell Peoples, MD Department of Hematology/Oncology [place] [facility] Phone: [phone] Pager: [phone] Email: [email or web address]

## 2022-07-13 ENCOUNTER — Telehealth: Payer: Self-pay | Admitting: Hematology and Oncology

## 2022-07-13 ENCOUNTER — Encounter (HOSPITAL_BASED_OUTPATIENT_CLINIC_OR_DEPARTMENT_OTHER): Payer: Self-pay | Admitting: Internal Medicine

## 2022-07-13 NOTE — Telephone Encounter (Signed)
Per 10/18 los called and spoke to pt about appointment  

## 2022-07-13 NOTE — Progress Notes (Signed)
AARADHYA, KYSAR ([id number]) [id number] Page 1 of 6 Visit Report for 07/13/2022 HPI Details Patient Name: Date of Service: [name], [name] 07/13/2022 3:15 PM Medical Record Number: [id number] Patient Account Number: [id number] Date of Birth/Sex: Treating RN: [birth year] (46 y.o. F) Primary Care Provider: Gwinda Passe Other Clinician: Referring Provider: Treating Provider/Extender: Aurelio Brash in Treatment: 7 History of Present Illness HPI Description: ADMISSION 05/24/2022 This is a 46 year old woman with a past medical history significant for hypertension, alcoholic hepatitis, chronic anemia, chronic kidney disease, and peptic ulcer disease. She was admitted to the hospital in the middle of July for hypovolemic shock secondary to dehydration from gastroenteritis. She was found on admission to have an ulcer on her left buttock within the confines of the natal cleft and approaching the anal verge. The etiology was felt to be moisture and friction rather than pressure, as the patient is completely ambulatory. No debridement was performed. She was readmitted at the beginning of August with worsening lower extremity edema and fatigue. The wound was basically unchanged. Wound nursing was consulted and they recommended using silver alginate. She was discharged from the hospital with referral to the wound care center. This past weekend, she noted a wound on her dorsal left foot that she thinks was secondary to friction from shoes. She is here for evaluation of both these wounds. ABI in clinic today was 1.18. She reports that she has quit drinking but she continues to smoke. On the dorsal surface of her left foot between her fourth and fifth metatarsal heads, there is a circular wound exposing the fat layer. There is some slough and eschar accumulation. It does appear consistent with her history of abrasion. On the medial aspect of her  left buttock extending into the natal cleft and towards the anal verge. There is a large ulcer with a very clean surface. There is substantial undermining at the cranial portion of the wound. No purulent drainage or malodor. 06/08/2022: The patient returns today with significant worsening of her dorsal foot wound. Apparently she saw her PCP yesterday who was concerned for cellulitis but did not prescribe any antibiotics. She was also sent for a DVT scan which was negative. There is heavy slough as well as liquefactive necrosis of the fat layer. There is a bulla between the second and third toes that looks as though it may rupture in the near future. The wound undermines under the skin towards this bulla. No frankly purulent drainage or any odor. Her gluteal wound is very clean but is relatively unchanged in size. She has been managing her wounds on her own and finds it difficult to pack the silver alginate into the undermined portion of the buttock wound. She says that she has been putting peroxide on her foot. 06/19/2022: The patient was not seen as a wound care visit last week because she arrived too late to her appointment. She did have a nursing visit, however. The culture that I took on 14 September grew out a polymicrobial population including Bacteroides fragilis Citrobacter Morganella Escherichia coli Enterococcus faecalis Pseudomonas aeruginosa and many others. I prescribed Bactrim and Augmentin for this polymicrobial infection based upon the resistance genes detected. Keystone topical compounded antibiotic was also ordered and she has been using that on her foot as well. Unfortunately, the wound on her dorsal foot continues to deteriorate. There is a larger area of undermining and more necrotic tissue, including muscle, on the wound surface. No significant malodor or frank pus. Her gluteal  wound, on the other hand, looks quite good. It is smaller and very clean and the undermined portion has  contracted substantially. 06/27/2022: Last week after our visit, she went to the hospital where she was admitted with sepsis. She underwent surgical debridement of her dorsal foot wound. Cultures taken intraoperatively were negative and a piece of PuraPly was sutured in place. She is currently taking Augmentin as prescribed on discharge. Her gluteal wound continues to contract. It is more superficial and there is even less undermining. She does have some biofilm accumulation on the surface. The dorsal foot wound is stained yellow from the Xeroform that was applied by podiatry. PuraPly is covering about a third of the wound. The rest of the wound surface is very clean. She did have quite a bit of drainage, but it was not purulent or malodorous. 07/05/2022: The wound on her buttocks continues to contract. It is very clean without any accumulation of slough or other debris. The PuraPly that was on her foot came off with her dressing. There are a couple of sutures remaining around the perimeter of the wound. There is slough buildup but no signs of necrosis. Granulation tissue is starting to form. 10/19; the patient has 2 wounds in the left buttock in the gluteal fold which was a pressure ulcer from a stay in hospital during the summer. She has a more recent wound on the left dorsal foot which was apparently complications of infection. Both wound surfaces look healthy. She is using silver alginate on the left foot Electronic Signature(s) Signed: 07/13/2022 4:29:29 PM By: Baltazar Najjar MD Entered By: Baltazar Najjar on 07/13/2022 16:12:14 -------------------------------------------------------------------------------- Physical Exam Details Patient Name: Date of Service: Lori Kennedy Chambers. 07/13/2022 3:15 PM Medical Record Number: [id number] Patient Account Number: [id number] [name], SCHMOKER ([id number]) [id number] Page 2 of 6 Date of Birth/Sex: Treating RN: [birth year]  (46 y.o. F) Primary Care Provider: Other Clinician: Gwinda Passe Referring Provider: Treating Provider/Extender: Aurelio Brash in Treatment: 7 Constitutional Sitting or standing Blood Pressure is within target range for patient.. Pulse regular and within target range for patient.Marland Kitchen Respirations regular, non-labored and within target range.. Temperature is normal and within the target range for the patient.Marland Kitchen Appears in no distress. Notes Wound exam; the area is on the left buttock in the gluteal fold not a common location for a pressure ulcer nevertheless it is clean with healthy granulation and some undermining but no evidence of infection. Her left foot on the dorsal foot again is clean and slightly smaller nice looking granulation no evidence of infection pedal pulses are palpable Electronic Signature(s) Signed: 07/13/2022 4:29:29 PM By: Baltazar Najjar MD Entered By: Baltazar Najjar on 07/13/2022 16:13:21 -------------------------------------------------------------------------------- Physician Orders Details Patient Name: Date of Service: Lori Kennedy Chambers. 07/13/2022 3:15 PM Medical Record Number: [id number] Patient Account Number: [id number] Date of Birth/Sex: Treating RN: January 12, 1976 (46 y.o. Orville Govern Primary Care Provider: Gwinda Passe Other Clinician: Referring Provider: Treating Provider/Extender: Aurelio Brash in Treatment: 7 Verbal / Phone Orders: No Diagnosis Coding Follow-up Appointments ppointment in 1 week. - Dr. Lady Gary - Rm 4 Return A Bathing/ Shower/ Hygiene May shower with protection but do not get wound dressing(s) wet. Edema Control - Lymphedema / SCD / Other Elevate legs to the level of the heart or above for 30 minutes daily and/or when sitting, a frequency of: Avoid standing for long periods of time. Wound Treatment Wound #1 - Foot Wound Laterality: Dorsal, Left Cleanser: Soap and  Water Every Other Day/30 Days Discharge Instructions: May shower and wash wound with dial antibacterial soap and water prior to dressing change. Cleanser: Wound Cleanser Every Other Day/30 Days Discharge Instructions: Cleanse the wound with wound cleanser prior to applying a clean dressing using gauze sponges, not tissue or cotton balls. Prim Dressing: Promogran Prisma Matrix, 4.34 (sq in) (silver collagen) Every Other Day/30 Days ary Discharge Instructions: Moisten collagen with saline or hydrogel Secondary Dressing: ABD Pad, 5x9 Every Other Day/30 Days Discharge Instructions: Apply over primary dressing as directed. Secondary Dressing: Woven Gauze Sponge, Non-Sterile 4x4 in Every Other Day/30 Days Discharge Instructions: Apply over primary dressing as directed. Secured With: Elastic Bandage 4 inch (ACE bandage) Every Other Day/30 Days Discharge Instructions: Secure with ACE bandage as directed. Secured With: [employer], 4.5x3.1 (in/yd) Every Other Day/30 Days Discharge Instructions: Secure with Kerlix as directed. Wound #2 - Gluteal fold Wound Laterality: Left Cleanser: Soap and Water 1 x Per Day/30 Days Discharge Instructions: May shower and wash wound with dial antibacterial soap and water prior to dressing change. [name], [name] ([id number]) [id number] Page 3 of 6 Cleanser: Wound Cleanser 1 x Per Day/30 Days Discharge Instructions: Cleanse the wound with wound cleanser prior to applying a clean dressing using gauze sponges, not tissue or cotton balls. Prim Dressing: KerraCel Ag Gelling Fiber Dressing, 4x5 in (silver alginate) 1 x Per Day/30 Days ary Discharge Instructions: Apply silver alginate to wound bed as instructed Secondary Dressing: ABD Pad, 5x9 1 x Per Day/30 Days Discharge Instructions: Apply over primary dressing as directed. Secondary Dressing: Woven Gauze Sponge, Non-Sterile 4x4 in 1 x Per Day/30 Days Discharge Instructions: Apply  over primary dressing as directed. Patient Medications llergies: adhesive tape A Notifications Medication Indication Start End prior to debridement 07/13/2022 lidocaine DOSE topical 5 % ointment - ointment topical Electronic Signature(s) Signed: 07/13/2022 4:29:29 PM By: Linton Ham MD Signed: 07/13/2022 5:03:48 PM By: Sharyn Creamer RN, BSN Entered By: Sharyn Creamer on 07/13/2022 16:04:02 -------------------------------------------------------------------------------- Problem List Details Patient Name: Date of Service: Lori Hawking Chambers. 07/13/2022 3:15 PM Medical Record Number: [id number] Patient Account Number: [id number] Date of Birth/Sex: Treating RN: 1975/10/26 (46 y.o. F) Primary Care Provider: Juluis Mire Other Clinician: Referring Provider: Treating Provider/Extender: Fleet Contras in Treatment: 7 Active Problems ICD-10 Encounter Code Description Active Date MDM Diagnosis [phone] Non-pressure chronic ulcer of other part of left foot with fat layer exposed 05/24/2022 No Yes L98.412 Non-pressure chronic ulcer of buttock with fat layer exposed 05/24/2022 No Yes N18.9 Chronic kidney disease, unspecified 05/24/2022 No Yes I77.82 Alcoholic hepatitis without ascites 05/24/2022 No Yes Inactive Problems Resolved Problems Electronic Signature(s) Signed: 07/13/2022 4:29:29 PM By: Linton Ham MD Entered By: Linton Ham on 07/13/2022 16:08:06 [name] ([id number]) [id number] Page 4 of 6 -------------------------------------------------------------------------------- Progress Note Details Patient Name: Date of Service: [name], [name] 07/13/2022 3:15 PM Medical Record Number: [id number] Patient Account Number: [id number] Date of Birth/Sex: Treating RN: 1976-06-13 (46 y.o. F) Primary Care Provider: Juluis Mire Other Clinician: Referring Provider: Treating Provider/Extender: Fleet Contras in Treatment: 7 Subjective History of Present Illness (HPI) ADMISSION 05/24/2022 This is a 46 year old woman with a past medical history significant for hypertension, alcoholic hepatitis, chronic anemia, chronic kidney disease, and peptic ulcer disease. She was admitted to the hospital in the middle of July for hypovolemic shock secondary to dehydration from gastroenteritis. She was found on admission to have an ulcer on her left buttock within the confines of the natal  cleft and approaching the anal verge. The etiology was felt to be moisture and friction rather than pressure, as the patient is completely ambulatory. No debridement was performed. She was readmitted at the beginning of August with worsening lower extremity edema and fatigue. The wound was basically unchanged. Wound nursing was consulted and they recommended using silver alginate. She was discharged from the hospital with referral to the wound care center. This past weekend, she noted a wound on her dorsal left foot that she thinks was secondary to friction from shoes. She is here for evaluation of both these wounds. ABI in clinic today was 1.18. She reports that she has quit drinking but she continues to smoke. On the dorsal surface of her left foot between her fourth and fifth metatarsal heads, there is a circular wound exposing the fat layer. There is some slough and eschar accumulation. It does appear consistent with her history of abrasion. On the medial aspect of her left buttock extending into the natal cleft and towards the anal verge. There is a large ulcer with a very clean surface. There is substantial undermining at the cranial portion of the wound. No purulent drainage or malodor. 06/08/2022: The patient returns today with significant worsening of her dorsal foot wound. Apparently she saw her PCP yesterday who was concerned for cellulitis but did not prescribe any antibiotics. She was  also sent for a DVT scan which was negative. There is heavy slough as well as liquefactive necrosis of the fat layer. There is a bulla between the second and third toes that looks as though it may rupture in the near future. The wound undermines under the skin towards this bulla. No frankly purulent drainage or any odor. Her gluteal wound is very clean but is relatively unchanged in size. She has been managing her wounds on her own and finds it difficult to pack the silver alginate into the undermined portion of the buttock wound. She says that she has been putting peroxide on her foot. 06/19/2022: The patient was not seen as a wound care visit last week because she arrived too late to her appointment. She did have a nursing visit, however. The culture that I took on 14 September grew out a polymicrobial population including Bacteroides fragilis Citrobacter Morganella Escherichia coli Enterococcus faecalis Pseudomonas aeruginosa and many others. I prescribed Bactrim and Augmentin for this polymicrobial infection based upon the resistance genes detected. Keystone topical compounded antibiotic was also ordered and she has been using that on her foot as well. Unfortunately, the wound on her dorsal foot continues to deteriorate. There is a larger area of undermining and more necrotic tissue, including muscle, on the wound surface. No significant malodor or frank pus. Her gluteal wound, on the other hand, looks quite good. It is smaller and very clean and the undermined portion has contracted substantially. 06/27/2022: Last week after our visit, she went to the hospital where she was admitted with sepsis. She underwent surgical debridement of her dorsal foot wound. Cultures taken intraoperatively were negative and a piece of PuraPly was sutured in place. She is currently taking Augmentin as prescribed on discharge. Her gluteal wound continues to contract. It is more superficial and there is even less  undermining. She does have some biofilm accumulation on the surface. The dorsal foot wound is stained yellow from the Xeroform that was applied by podiatry. PuraPly is covering about a third of the wound. The rest of the wound surface is very clean. She did have quite  a bit of drainage, but it was not purulent or malodorous. 07/05/2022: The wound on her buttocks continues to contract. It is very clean without any accumulation of slough or other debris. The PuraPly that was on her foot came off with her dressing. There are a couple of sutures remaining around the perimeter of the wound. There is slough buildup but no signs of necrosis. Granulation tissue is starting to form. 10/19; the patient has 2 wounds in the left buttock in the gluteal fold which was a pressure ulcer from a stay in hospital during the summer. She has a more recent wound on the left dorsal foot which was apparently complications of infection. Both wound surfaces look healthy. She is using silver alginate on the left foot Objective Constitutional Sitting or standing Blood Pressure is within target range for patient.. Pulse regular and within target range for patient.Marland Kitchen Respirations regular, non-labored and within target range.. Temperature is normal and within the target range for the patient.Marland Kitchen Appears in no distress. Vitals Time Taken: 3:35 PM, Height: 66 in, Weight: 197 lbs, BMI: 31.8, Temperature: 98.5 F, Pulse: 100 bpm, Respiratory Rate: 16 breaths/min, Blood Pressure: 121/78 mmHg. General Notes: Wound exam; the area is on the left buttock in the gluteal fold not a common location for a pressure ulcer nevertheless it is clean with healthy granulation and some undermining but no evidence of infection. Her left foot on the dorsal foot again is clean and slightly smaller nice looking granulation no [name], [name] ([id number]) [id number] Page 5 of 6 evidence of infection pedal pulses are  palpable Integumentary (Hair, Skin) Wound #1 status is Open. Original cause of wound was Shear/Friction. The date acquired was: 05/20/2022. The wound has been in treatment 7 weeks. The wound is located on the Left,Dorsal Foot. The wound measures 2.4cm length x 4.5cm width x 0.2cm depth; 8.482cm^2 area and 1.696cm^3 volume. There is Fat Layer (Subcutaneous Tissue) exposed. There is no tunneling or undermining noted. There is a large amount of serosanguineous drainage noted. The wound margin is distinct with the outline attached to the wound base. There is medium (34-66%) red, pink, hyper - granulation within the wound bed. There is a medium (34-66%) amount of necrotic tissue within the wound bed including Adherent Slough. The periwound skin appearance had no abnormalities noted for texture. The periwound skin appearance had no abnormalities noted for moisture. The periwound skin appearance had no abnormalities noted for color. Wound #2 status is Open. Original cause of wound was Shear/Friction. The date acquired was: 03/20/2022. The wound has been in treatment 7 weeks. The wound is located on the Left Gluteal fold. The wound measures 5.2cm length x 3.6cm width x 0.3cm depth; 14.703cm^2 area and 4.411cm^3 volume. There is Fat Layer (Subcutaneous Tissue) exposed. There is undermining starting at 11:00 and ending at 5:00 with a maximum distance of 1.6cm. There is a medium amount of serosanguineous drainage noted. The wound margin is distinct with the outline attached to the wound base. There is large (67-100%) red, pink granulation within the wound bed. There is a small (1-33%) amount of necrotic tissue within the wound bed including Adherent Slough. The periwound skin appearance had no abnormalities noted for texture. The periwound skin appearance had no abnormalities noted for moisture. The periwound skin appearance had no abnormalities noted for color. Periwound temperature was noted as No Abnormality.  The periwound has tenderness on palpation. Assessment Active Problems ICD-10 Non-pressure chronic ulcer of other part of left foot with fat layer exposed  Non-pressure chronic ulcer of buttock with fat layer exposed Chronic kidney disease, unspecified Alcoholic hepatitis without ascites Plan Follow-up Appointments: Return Appointment in 1 week. - Dr. Lady Gary - Rm 4 Bathing/ Shower/ Hygiene: May shower with protection but do not get wound dressing(s) wet. Edema Control - Lymphedema / SCD / Other: Elevate legs to the level of the heart or above for 30 minutes daily and/or when sitting, a frequency of: Avoid standing for long periods of time. The following medication(s) was prescribed: lidocaine topical 5 % ointment ointment topical for prior to debridement was prescribed at facility WOUND #1: - Foot Wound Laterality: Dorsal, Left Cleanser: Soap and Water Every Other Day/30 Days Discharge Instructions: May shower and wash wound with dial antibacterial soap and water prior to dressing change. Cleanser: Wound Cleanser Every Other Day/30 Days Discharge Instructions: Cleanse the wound with wound cleanser prior to applying a clean dressing using gauze sponges, not tissue or cotton balls. Prim Dressing: Promogran Prisma Matrix, 4.34 (sq in) (silver collagen) Every Other Day/30 Days ary Discharge Instructions: Moisten collagen with saline or hydrogel Secondary Dressing: ABD Pad, 5x9 Every Other Day/30 Days Discharge Instructions: Apply over primary dressing as directed. Secondary Dressing: Woven Gauze Sponge, Non-Sterile 4x4 in Every Other Day/30 Days Discharge Instructions: Apply over primary dressing as directed. Secured With: Elastic Bandage 4 inch (ACE bandage) Every Other Day/30 Days Discharge Instructions: Secure with ACE bandage as directed. Secured With: [employer], 4.5x3.1 (in/yd) Every Other Day/30 Days Discharge Instructions: Secure with Kerlix as directed. WOUND #2: -  Gluteal fold Wound Laterality: Left Cleanser: Soap and Water 1 x Per Day/30 Days Discharge Instructions: May shower and wash wound with dial antibacterial soap and water prior to dressing change. Cleanser: Wound Cleanser 1 x Per Day/30 Days Discharge Instructions: Cleanse the wound with wound cleanser prior to applying a clean dressing using gauze sponges, not tissue or cotton balls. Prim Dressing: KerraCel Ag Gelling Fiber Dressing, 4x5 in (silver alginate) 1 x Per Day/30 Days ary Discharge Instructions: Apply silver alginate to wound bed as instructed Secondary Dressing: ABD Pad, 5x9 1 x Per Day/30 Days Discharge Instructions: Apply over primary dressing as directed. Secondary Dressing: Woven Gauze Sponge, Non-Sterile 4x4 in 1 x Per Day/30 Days Discharge Instructions: Apply over primary dressing as directed. 1. I did not change the primary dressings which are silver alginate on the buttock wound and collagen/Prisma on the left foot. Both appear to be doing well 2. The patient is changing the wound on her buttock by herself she seems to be doing an adequate job. Still too large to consider alternatives and then you would have the issue of her changing a more complicated dressing. There is no way this could be wound VAC Electronic Signature(s) Signed: 07/13/2022 4:29:29 PM By: Baltazar Najjar MD Entered By: Baltazar Najjar on 07/13/2022 16:15:24 Minta Balsam ([id number]) [id number] Page 6 of 6 -------------------------------------------------------------------------------- SuperBill Details Patient Name: Date of Service: [name], [name] 07/13/2022 Medical Record Number: [id number] Patient Account Number: [id number] Date of Birth/Sex: Treating RN: 23-Dec-1975 (46 y.o. Orville Govern Primary Care Provider: Gwinda Passe Other Clinician: Referring Provider: Treating Provider/Extender: Aurelio Brash in Treatment:  7 Diagnosis Coding ICD-10 Codes Code Description [id number] Non-pressure chronic ulcer of other part of left foot with fat layer exposed L98.412 Non-pressure chronic ulcer of buttock with fat layer exposed N18.9 Chronic kidney disease, unspecified K70.10 Alcoholic hepatitis without ascites Facility Procedures : CPT4 Code: [id number] Description: 99213 - WOUND CARE VISIT-LEV 3 EST  PT Modifier: Quantity: 1 Physician Procedures : CPT4 Code Description Modifier [id number] - WC PHYS LEVEL 3 - EST PT ICD-10 Diagnosis Description L97.522 Non-pressure chronic ulcer of other part of left foot with fat layer exposed L98.412 Non-pressure chronic ulcer of buttock with fat layer  exposed Quantity: 1 Electronic Signature(s) Signed: 07/13/2022 4:29:29 PM By: Baltazar Najjar MD Entered By: Baltazar Najjar on 07/13/2022 16:15:44

## 2022-07-13 NOTE — Progress Notes (Signed)
DEIDREA, GAETZ ([id number]) [id number] Page 1 of 11 Visit Report for 07/13/2022 Arrival Information Details Patient Name: Date of Service: [name], [name] 07/13/2022 3:15 PM Medical Record Number: [id number] Patient Account Number: [id number] Date of Birth/Sex: Treating RN: 12-17-1975 (46 y.o. [name], Morey Hummingbird Primary Care Adylene Dlugosz: Juluis Mire Other Clinician: Referring Icey Tello: Treating [name]: [name] in Treatment: 7 Visit Information History Since Last Visit Added or deleted any medications: No Patient Arrived: Ambulatory Any new allergies or adverse reactions: No Arrival Time: 15:34 Had a fall or experienced change in No Accompanied By: self activities of daily living that may affect Transfer Assistance: None risk of falls: Patient Identification Verified: Yes Signs or symptoms of abuse/neglect since last visito No Secondary Verification Process Completed: Yes Hospitalized since last visit: No Patient Requires Transmission-Based Precautions: No Implantable device outside of the clinic excluding No Patient Has Alerts: No cellular tissue based products placed in the center since last visit: Has Dressing in Place as Prescribed: Yes Has Compression in Place as Prescribed: Yes Pain Present Now: Yes Electronic Signature(s) Signed: 07/13/2022 5:03:48 PM By: Sharyn Creamer RN, BSN Entered By: Sharyn Creamer on 07/13/2022 15:35:05 -------------------------------------------------------------------------------- Clinic Level of Care Assessment Details Patient Name: Date of Service: [name], [name] 07/13/2022 3:15 PM Medical Record Number: [id number] Patient Account Number: [id number] Date of Birth/Sex: Treating RN: 14-Jan-1976 (46 y.o. [name] Primary Care Deeana Atwater: Juluis Mire Other Clinician: Referring Ersel Wadleigh: Treating Adilenne Ashworth/Extender: [name] in Treatment: 7 Clinic Level of Care Assessment Items TOOL 4 Quantity Score X- 1 0 Use when only an EandM is performed on FOLLOW-UP visit ASSESSMENTS - Nursing Assessment / Reassessment X- 1 10 Reassessment of Co-morbidities (includes updates in patient status) X- 1 5 Reassessment of Adherence to Treatment Plan ASSESSMENTS - Wound and Skin A ssessment / Reassessment '[]'  - 0 Simple Wound Assessment / Reassessment - one wound X- 2 5 Complex Wound Assessment / Reassessment - multiple wounds '[]'  - 0 Dermatologic / Skin Assessment (not related to wound area) ASSESSMENTS - Focused Assessment X- 1 5 Circumferential Edema Measurements - multi extremities '[]'  - 0 Nutritional Assessment / Counseling / Intervention [name], [name] ([id number]) [id number] Page 2 of 11 '[]'  - 0 Lower Extremity Assessment (monofilament, tuning fork, pulses) '[]'  - 0 Peripheral Arterial Disease Assessment (using hand held doppler) ASSESSMENTS - Ostomy and/or Continence Assessment and Care '[]'  - 0 Incontinence Assessment and Management '[]'  - 0 Ostomy Care Assessment and Management (repouching, etc.) PROCESS - Coordination of Care X - Simple Patient / Family Education for ongoing care 1 15 '[]'  - 0 Complex (extensive) Patient / Family Education for ongoing care X- 1 10 Staff obtains Programmer, systems, Records, T Results / Process Orders est '[]'  - 0 Staff telephones HHA, Nursing Homes / Clarify orders / etc '[]'  - 0 Routine Transfer to another Facility (non-emergent condition) '[]'  - 0 Routine Hospital Admission (non-emergent condition) '[]'  - 0 New Admissions / Biomedical engineer / Ordering NPWT Apligraf, etc. , '[]'  - 0 Emergency Hospital Admission (emergent condition) '[]'  - 0 Simple Discharge Coordination '[]'  - 0 Complex (extensive) Discharge Coordination PROCESS - Special Needs '[]'  - 0 Pediatric / Minor Patient Management '[]'  - 0 Isolation Patient Management '[]'  -  0 Hearing / Language / Visual special needs '[]'  - 0 Assessment of Community assistance (transportation, D/C planning, etc.) '[]'  - 0 Additional assistance / Altered mentation '[]'  - 0 Support Surface(s) Assessment (bed, cushion, seat, etc.) INTERVENTIONS - Wound Cleansing /  Measurement '[]'  - 0 Simple Wound Cleansing - one wound X- 2 5 Complex Wound Cleansing - multiple wounds X- 1 5 Wound Imaging (photographs - any number of wounds) '[]'  - 0 Wound Tracing (instead of photographs) '[]'  - 0 Simple Wound Measurement - one wound X- 2 5 Complex Wound Measurement - multiple wounds INTERVENTIONS - Wound Dressings '[]'  - 0 Small Wound Dressing one or multiple wounds X- 2 15 Medium Wound Dressing one or multiple wounds '[]'  - 0 Large Wound Dressing one or multiple wounds '[]'  - 0 Application of Medications - topical '[]'  - 0 Application of Medications - injection INTERVENTIONS - Miscellaneous '[]'  - 0 External ear exam '[]'  - 0 Specimen Collection (cultures, biopsies, blood, body fluids, etc.) '[]'  - 0 Specimen(s) / Culture(s) sent or taken to Lab for analysis '[]'  - 0 Patient Transfer (multiple staff / Civil Service fast streamer / Similar devices) '[]'  - 0 Simple Staple / Suture removal (25 or less) '[]'  - 0 Complex Staple / Suture removal (26 or more) '[]'  - 0 Hypo / Hyperglycemic Management (close monitor of Blood Glucose) [name], [name] ([id number]) [id number] Page 3 of 11 '[]'  - 0 Ankle / Brachial Index (ABI) - do not check if billed separately X- 1 5 Vital Signs Has the patient been seen at the hospital within the last three years: Yes Total Score: 115 Level Of Care: New/Established - Level 3 Electronic Signature(s) Signed: 07/13/2022 5:03:48 PM By: Sharyn Creamer RN, BSN Entered By: Sharyn Creamer on 07/13/2022 16:07:24 -------------------------------------------------------------------------------- Encounter Discharge Information Details Patient Name: Date of Service: Lori Hawking Chambers. 07/13/2022 3:15 PM Medical Record Number: [id number] Patient Account Number: [id number] Date of Birth/Sex: Treating RN: 26-Aug-1976 (46 y.o. [name] Primary Care Janny Crute: Juluis Mire Other Clinician: Referring Vetta Couzens: Treating Nelwyn Hebdon/Extender: [name] in Treatment: 7 Encounter Discharge Information Items Discharge Condition: Stable Ambulatory Status: Ambulatory Discharge Destination: Home Transportation: Private Auto Accompanied By: self Schedule Follow-up Appointment: Yes Clinical Summary of Care: Patient Declined Electronic Signature(s) Signed: 07/13/2022 5:03:48 PM By: Sharyn Creamer RN, BSN Entered By: Sharyn Creamer on 07/13/2022 16:05:06 -------------------------------------------------------------------------------- Lower Extremity Assessment Details Patient Name: Date of Service: Lori Hawking Chambers. 07/13/2022 3:15 PM Medical Record Number: [id number] Patient Account Number: [id number] Date of Birth/Sex: Treating RN: [birth year] (46 y.o. [name] Primary Care Haskell Rihn: Juluis Mire Other Clinician: Referring Amelio Brosky: Treating Makinna Andy/Extender: [name] in Treatment: 7 Edema Assessment Assessed: [Left: No] [Right: No] [Left: Edema] [Right: :] Calf Left: Right: Point of Measurement: From Medial Instep 34 cm Ankle Left: Right: Point of Measurement: From Medial Instep 23.5 cm Electronic Signature(s) MARIBEL, HADLEY Chambers ([id number]) [id number] Page 4 of 11 Signed: 07/13/2022 5:03:48 PM By: Sharyn Creamer RN, BSN Entered By: Sharyn Creamer on 07/13/2022 15:55:30 -------------------------------------------------------------------------------- Multi Wound Chart Details Patient Name: Date of Service: Lori Hawking Chambers. 07/13/2022 3:15 PM Medical Record Number: [id number] Patient Account Number: [id number] Date of Birth/Sex: Treating  RN: [birth year] (46 y.o. F) Primary Care Ancelmo Hunt: Juluis Mire Other Clinician: Referring Kyrie Bun: Treating Lynk Marti/Extender: [name] in Treatment: 7 Vital Signs Height(in): 38 Pulse(bpm): 100 Weight(lbs): 197 Blood Pressure(mmHg): 121/78 Body Mass Index(BMI): 31.8 Temperature(F): 98.5 Respiratory Rate(breaths/min): 16 [1:Photos:] [N/A:N/A] Left, Dorsal Foot Left Gluteal fold N/A Wound Location: Shear/Friction Shear/Friction N/A Wounding Event: Trauma, Other Trauma, Other N/A Primary Etiology: Anemia, Lymphedema, Hypertension, Anemia, Lymphedema, Hypertension, N/A Comorbid History: Hypotension, Cirrhosis Hypotension, Cirrhosis 05/20/2022 03/20/2022 N/A Date Acquired: 7 7 N/A Weeks of Treatment: Open Open N/A Wound  Status: No No N/A Wound Recurrence: 2.4x4.5x0.2 5.2x3.6x0.3 N/A Measurements L x W x D (cm) 8.482 14.703 N/A A (cm) : rea 1.696 4.411 N/A Volume (cm) : -1099.70% 41.90% N/A % Reduction in A rea: -2288.70% 86.60% N/A % Reduction in Volume: 11 Starting Position 1 (o'clock): 5 Ending Position 1 (o'clock): 1.6 Maximum Distance 1 (cm): No Yes N/A Undermining: Full Thickness Without Exposed Full Thickness Without Exposed N/A Classification: Support Structures Support Structures Large Medium N/A Exudate Amount: Serosanguineous Serosanguineous N/A Exudate Type: red, brown red, brown N/A Exudate Color: Distinct, outline attached Distinct, outline attached N/A Wound Margin: Medium (34-66%) Large (67-100%) N/A Granulation Amount: Red, Pink, Hyper-granulation Red, Pink N/A Granulation Quality: Medium (34-66%) Small (1-33%) N/A Necrotic Amount: Fat Layer (Subcutaneous Tissue): Yes Fat Layer (Subcutaneous Tissue): Yes N/A Exposed Structures: Fascia: No Fascia: No Tendon: No Tendon: No Muscle: No Muscle: No Joint: No Joint: No Bone: No Bone: No None Small (1-33%) N/A Epithelialization: No  Abnormalities Noted No Abnormalities Noted N/A Periwound Skin Texture: No Abnormalities Noted No Abnormalities Noted N/A Periwound Skin Moisture: No Abnormalities Noted No Abnormalities Noted N/A Periwound Skin Color: N/A No Abnormality N/A Temperature: N/A Yes N/A Tenderness on Palpation: LEYLANI, DULEY Chambers ([id number]) [id number] Page 5 of 11 Treatment Notes Wound #1 (Foot) Wound Laterality: Dorsal, Left Cleanser Soap and Water Discharge Instruction: May shower and wash wound with dial antibacterial soap and water prior to dressing change. Wound Cleanser Discharge Instruction: Cleanse the wound with wound cleanser prior to applying a clean dressing using gauze sponges, not tissue or cotton Chambers. Peri-Wound Care Topical Primary Dressing Promogran Prisma Matrix, 4.34 (sq in) (silver collagen) Discharge Instruction: Moisten collagen with saline or hydrogel Secondary Dressing ABD Pad, 5x9 Discharge Instruction: Apply over primary dressing as directed. Woven Gauze Sponge, Non-Sterile 4x4 in Discharge Instruction: Apply over primary dressing as directed. Secured With Elastic Bandage 4 inch (ACE bandage) Discharge Instruction: Secure with ACE bandage as directed. Kerlix Roll Sterile, 4.5x3.1 (in/yd) Discharge Instruction: Secure with Kerlix as directed. Compression Wrap Compression Stockings Add-Ons Wound #2 (Gluteal fold) Wound Laterality: Left Cleanser Soap and Water Discharge Instruction: May shower and wash wound with dial antibacterial soap and water prior to dressing change. Wound Cleanser Discharge Instruction: Cleanse the wound with wound cleanser prior to applying a clean dressing using gauze sponges, not tissue or cotton Chambers. Peri-Wound Care Topical Primary Dressing KerraCel Ag Gelling Fiber Dressing, 4x5 in (silver alginate) Discharge Instruction: Apply silver alginate to wound bed as instructed Secondary Dressing ABD Pad,  5x9 Discharge Instruction: Apply over primary dressing as directed. Woven Gauze Sponge, Non-Sterile 4x4 in Discharge Instruction: Apply over primary dressing as directed. Secured With Compression Wrap Compression Stockings Environmental education officer) Signed: 07/13/2022 4:29:29 PM By: Linton Ham MD Entered By: Linton Ham on 07/13/2022 16:08:14 [name] ([id number]) [id number] Page 6 of 11 -------------------------------------------------------------------------------- Multi-Disciplinary Care Plan Details Patient Name: Date of Service: ZAYDA, ANGELL 07/13/2022 3:15 PM Medical Record Number: [id number] Patient Account Number: [id number] Date of Birth/Sex: Treating RN: 06/01/1976 (46 y.o. [name] Primary Care Carol Theys: Juluis Mire Other Clinician: Referring Donyel Castagnola: Treating Anela Bensman/Extender: [name] in Treatment: 7 Multidisciplinary Care Plan reviewed with physician Active Inactive Pressure Nursing Diagnoses: Knowledge deficit related to causes and risk factors for pressure ulcer development Potential for impaired tissue integrity related to pressure, friction, moisture, and shear Goals: Patient will remain free from development of additional pressure ulcers Date Initiated: 05/24/2022 Date Inactivated: 06/27/2022 Target Resolution Date: 06/30/2022 Goal Status: Met  Patient/caregiver will verbalize risk factors for pressure ulcer development Date Initiated: 05/24/2022 Date Inactivated: 06/27/2022 Target Resolution Date: 06/30/2022 Goal Status: Met Patient/caregiver will verbalize understanding of pressure ulcer management Date Initiated: 06/27/2022 Target Resolution Date: 07/25/2022 Goal Status: Active Interventions: Assess: immobility, friction, shearing, incontinence upon admission and as needed Assess potential for pressure ulcer upon admission and as  needed Notes: Wound/Skin Impairment Nursing Diagnoses: Impaired tissue integrity Knowledge deficit related to ulceration/compromised skin integrity Goals: Patient will demonstrate a reduced rate of smoking or cessation of smoking Date Initiated: 05/24/2022 Target Resolution Date: 07/25/2022 Goal Status: Active Patient/caregiver will verbalize understanding of skin care regimen Date Initiated: 05/24/2022 Target Resolution Date: 07/25/2022 Goal Status: Active Ulcer/skin breakdown will have a volume reduction of 50% by week 8 Date Initiated: 06/27/2022 Target Resolution Date: 07/25/2022 Goal Status: Active Interventions: Assess patient/caregiver ability to obtain necessary supplies Assess ulceration(s) every visit Treatment Activities: Skin care regimen initiated : 05/24/2022 Topical wound management initiated : 05/24/2022 Notes: Electronic Signature(s) Signed: 07/13/2022 5:03:48 PM By: Sharyn Creamer RN, BSN Entered By: Sharyn Creamer on 07/13/2022 15:59:22 [name] ([id number]) [id number] Page 7 of 11 -------------------------------------------------------------------------------- Pain Assessment Details Patient Name: Date of Service: [name], [name] 07/13/2022 3:15 PM Medical Record Number: [id number] Patient Account Number: [id number] Date of Birth/Sex: Treating RN: January 12, 1976 (46 y.o. [name] Primary Care Steffen Hase: Juluis Mire Other Clinician: Referring Arion Morgan: Treating Lakaya Tolen/Extender: [name] in Treatment: 7 Active Problems Location of Pain Severity and Description of Pain Patient Has Paino Yes Site Locations Rate the pain. Current Pain Level: 5 Character of Pain Describe the Pain: Burning Pain Management and Medication Current Pain Management: Electronic Signature(s) Signed: 07/13/2022 5:03:48 PM By: Sharyn Creamer RN, BSN Entered By: Sharyn Creamer on 07/13/2022  15:36:02 -------------------------------------------------------------------------------- Patient/Caregiver Education Details Patient Name: Date of Service: [name] 10/19/2023andnbsp3:15 PM Medical Record Number: [id number] Patient Account Number: [id number] Date of Birth/Gender: Treating RN: [birth year] (46 y.o. [name] Primary Care Physician: Juluis Mire Other Clinician: Referring Physician: Treating Physician/Extender: [name] in Treatment: 7 Education Assessment Education Provided To: Patient Education Topics Provided Pressure: [name], [name] ([id number]) [phone].pdf Page 8 of 11 Methods: Explain/Verbal Responses: State content correctly Wound/Skin Impairment: Methods: Explain/Verbal Responses: State content correctly Electronic Signature(s) Signed: 07/13/2022 5:03:48 PM By: Sharyn Creamer RN, BSN Entered By: Sharyn Creamer on 07/13/2022 16:00:07 -------------------------------------------------------------------------------- Wound Assessment Details Patient Name: Date of Service: Lori Hawking Chambers. 07/13/2022 3:15 PM Medical Record Number: [id number] Patient Account Number: [id number] Date of Birth/Sex: Treating RN: Jan 22, 1976 (46 y.o. [name] Primary Care Minyon Billiter: Juluis Mire Other Clinician: Referring Chea Malan: Treating Haasini Patnaude/Extender: [name] in Treatment: 7 Wound Status Wound Number: 1 Primary Etiology: Trauma, Other Wound Location: Left, Dorsal Foot Wound Status: Open Wounding Event: Shear/Friction Comorbid Anemia, Lymphedema, Hypertension, Hypotension, History: Cirrhosis Date Acquired: 05/20/2022 Weeks Of Treatment: 7 Clustered Wound: No Photos Wound Measurements Length: (cm) 2.4 Width: (cm) 4.5 Depth: (cm) 0.2 Area: (cm) 8.482 Volume: (cm) 1.696 % Reduction in Area: -1099.7% % Reduction in Volume:  -2288.7% Epithelialization: None Tunneling: No Undermining: No Wound Description Classification: Full Thickness Without Exposed Suppor Wound Margin: Distinct, outline attached Exudate Amount: Large Exudate Type: Serosanguineous Exudate Color: red, brown t Structures Foul Odor After Cleansing: No Slough/Fibrino Yes Wound Bed Granulation Amount: Medium (34-66%) Exposed Structure Granulation Quality: Red, Pink, Hyper-granulation Fascia Exposed: No Necrotic Amount: Medium (34-66%) Fat Layer (Subcutaneous Tissue) Exposed: Yes Necrotic Quality: Adherent Slough Tendon Exposed: No Muscle  Exposed: No Joint Exposed: No Bone Exposed: No Screws, [name] ([id number]) [id number] Page 9 of 11 Periwound Skin Texture Texture Color No Abnormalities Noted: Yes No Abnormalities Noted: Yes Moisture No Abnormalities Noted: Yes Treatment Notes Wound #1 (Foot) Wound Laterality: Dorsal, Left Cleanser Soap and Water Discharge Instruction: May shower and wash wound with dial antibacterial soap and water prior to dressing change. Wound Cleanser Discharge Instruction: Cleanse the wound with wound cleanser prior to applying a clean dressing using gauze sponges, not tissue or cotton Chambers. Peri-Wound Care Topical Primary Dressing Promogran Prisma Matrix, 4.34 (sq in) (silver collagen) Discharge Instruction: Moisten collagen with saline or hydrogel Secondary Dressing ABD Pad, 5x9 Discharge Instruction: Apply over primary dressing as directed. Woven Gauze Sponge, Non-Sterile 4x4 in Discharge Instruction: Apply over primary dressing as directed. Secured With Elastic Bandage 4 inch (ACE bandage) Discharge Instruction: Secure with ACE bandage as directed. Kerlix Roll Sterile, 4.5x3.1 (in/yd) Discharge Instruction: Secure with Kerlix as directed. Compression Wrap Compression Stockings Add-Ons Electronic Signature(s) Signed: 07/13/2022 5:03:48 PM By: Sharyn Creamer RN,  BSN Entered By: Sharyn Creamer on 07/13/2022 15:49:16 -------------------------------------------------------------------------------- Wound Assessment Details Patient Name: Date of Service: [name]. 07/13/2022 3:15 PM Medical Record Number: [id number] Patient Account Number: [id number] Date of Birth/Sex: Treating RN: [birth year] (46 y.o. [name] Primary Care Latorsha Curling: Juluis Mire Other Clinician: Referring Kineta Fudala: Treating Xzavion Doswell/Extender: [name] in Treatment: 7 Wound Status Wound Number: 2 Primary Etiology: Trauma, Other Wound Location: Left Gluteal fold Wound Status: Open Wounding Event: Shear/Friction Comorbid Anemia, Lymphedema, Hypertension, Hypotension, History: Cirrhosis Date Acquired: 03/20/2022 Weeks Of Treatment: 7 Clustered Wound: No Photos [name], [name] ([id number]) [id number] Page 10 of 11 Wound Measurements Length: (cm) 5.2 Width: (cm) 3.6 Depth: (cm) 0.3 Area: (cm) 14.703 Volume: (cm) 4.411 % Reduction in Area: 41.9% % Reduction in Volume: 86.6% Epithelialization: Small (1-33%) Undermining: Yes Starting Position (o'clock): 11 Ending Position (o'clock): 5 Maximum Distance: (cm) 1.6 Wound Description Classification: Full Thickness Without Exposed Support Structures Wound Margin: Distinct, outline attached Exudate Amount: Medium Exudate Type: Serosanguineous Exudate Color: red, brown Foul Odor After Cleansing: No Slough/Fibrino No Wound Bed Granulation Amount: Large (67-100%) Exposed Structure Granulation Quality: Red, Pink Fascia Exposed: No Necrotic Amount: Small (1-33%) Fat Layer (Subcutaneous Tissue) Exposed: Yes Necrotic Quality: Adherent Slough Tendon Exposed: No Muscle Exposed: No Joint Exposed: No Bone Exposed: No Periwound Skin Texture Texture Color No Abnormalities Noted: Yes No Abnormalities Noted: Yes Moisture Temperature / Pain No  Abnormalities Noted: Yes Temperature: No Abnormality Tenderness on Palpation: Yes Treatment Notes Wound #2 (Gluteal fold) Wound Laterality: Left Cleanser Soap and Water Discharge Instruction: May shower and wash wound with dial antibacterial soap and water prior to dressing change. Wound Cleanser Discharge Instruction: Cleanse the wound with wound cleanser prior to applying a clean dressing using gauze sponges, not tissue or cotton Chambers. Peri-Wound Care Topical Primary Dressing KerraCel Ag Gelling Fiber Dressing, 4x5 in (silver alginate) Discharge Instruction: Apply silver alginate to wound bed as instructed Secondary Dressing ABD Pad, 5x9 Discharge Instruction: Apply over primary dressing as directed. Woven Gauze Sponge, Non-Sterile 4x4 in Discharge Instruction: Apply over primary dressing as directed. Secured With Compression TANITH, DAGOSTINO ([id number]) [id number] Page 11 of 11 Compression Stockings Add-Ons Electronic Signature(s) Signed: 07/13/2022 5:03:48 PM By: Sharyn Creamer RN, BSN Entered By: Sharyn Creamer on 07/13/2022 15:54:21 -------------------------------------------------------------------------------- Vitals Details Patient Name: Date of Service: Lori Hawking Chambers. 07/13/2022 3:15 PM Medical Record Number: [id number] Patient Account Number: [id number] Date of Birth/Sex:  Treating RN: 01-31-1976 (46 y.o. [name] Primary Care Alydia Gosser: Juluis Mire Other Clinician: Referring Kerra Guilfoil: Treating Dymir Neeson/Extender: [name] in Treatment: 7 Vital Signs Time Taken: 15:35 Temperature (F): 98.5 Height (in): 66 Pulse (bpm): 100 Weight (lbs): 197 Respiratory Rate (breaths/min): 16 Body Mass Index (BMI): 31.8 Blood Pressure (mmHg): 121/78 Reference Range: 80 - 120 mg / dl Electronic Signature(s) Signed: 07/13/2022 5:03:48 PM By: Sharyn Creamer RN, BSN Entered By: Sharyn Creamer on  07/13/2022 15:35:38

## 2022-07-16 ENCOUNTER — Encounter: Payer: Self-pay | Admitting: Physician Assistant

## 2022-07-18 ENCOUNTER — Ambulatory Visit (HOSPITAL_BASED_OUTPATIENT_CLINIC_OR_DEPARTMENT_OTHER): Payer: Self-pay | Admitting: General Surgery

## 2022-07-18 ENCOUNTER — Encounter: Payer: Self-pay | Admitting: Gastroenterology

## 2022-07-18 ENCOUNTER — Ambulatory Visit (INDEPENDENT_AMBULATORY_CARE_PROVIDER_SITE_OTHER): Payer: Self-pay | Admitting: Gastroenterology

## 2022-07-18 VITALS — BP 122/80 | HR 105 | Ht 66.0 in | Wt 184.2 lb

## 2022-07-18 DIAGNOSIS — K289 Gastrojejunal ulcer, unspecified as acute or chronic, without hemorrhage or perforation: Secondary | ICD-10-CM

## 2022-07-18 DIAGNOSIS — D509 Iron deficiency anemia, unspecified: Secondary | ICD-10-CM

## 2022-07-18 DIAGNOSIS — K7030 Alcoholic cirrhosis of liver without ascites: Secondary | ICD-10-CM

## 2022-07-18 NOTE — Patient Instructions (Addendum)
_______________________________________________________  If you are age 45 or older, your body mass index should be between 23-30. Your Body mass index is 29.74 kg/m. If this is out of the aforementioned range listed, please consider follow up with your Primary Care Provider.  If you are age 64 or younger, your body mass index should be between 19-25. Your Body mass index is 29.74 kg/m. If this is out of the aformentioned range listed, please consider follow up with your Primary Care Provider.   ________________________________________________________  The [facility] providers would like to encourage you to use [facility] to communicate with providers for non-urgent requests or questions.  Due to long hold times on the telephone, sending your provider a message by [facility] may be a faster and more efficient way to get a response.  Please allow 48 business hours for a response.  Please remember that this is for non-urgent requests.  _______________________________________________________  [name] have been scheduled for an endoscopy and colonoscopy. Please follow the written instructions given to you at your visit today. Please pick up your prep supplies at the pharmacy within the next 1-3 days. If you use inhalers (even only as needed), please bring them with you on the day of your procedure.  Stop Iron 1 week prior to the procedure  Continue Protonix 40mg  2 times a day  A referral will be sent to [facility]. Please call in 3 weeks if you haven't heard anything to [phone]  Do a low salt diet  Low-Sodium Eating Plan Sodium, which is an element that makes up salt, helps you maintain a healthy balance of fluids in your body. Too much sodium can increase your blood pressure and cause fluid and waste to be held in your body. Your health care provider or dietitian may recommend following this plan if you have high blood pressure (hypertension), kidney disease, liver disease, or heart failure.  Eating less sodium can help lower your blood pressure, reduce swelling, and protect your heart, liver, and kidneys. What are tips for following this plan? Reading food labels The Nutrition Facts label lists the amount of sodium in one serving of the food. If you eat more than one serving, you must multiply the listed amount of sodium by the number of servings. Choose foods with less than 140 mg of sodium per serving. Avoid foods with 300 mg of sodium or more per serving. Shopping  Look for lower-sodium products, often labeled as "low-sodium" or "no salt added." Always check the sodium content, even if foods are labeled as "unsalted" or "no salt added." Buy fresh foods. Avoid canned foods and pre-made or frozen meals. Avoid canned, cured, or processed meats. Buy breads that have less than 80 mg of sodium per slice. Cooking  Eat more home-cooked food and less restaurant, buffet, and fast food. Avoid adding salt when cooking. Use salt-free seasonings or herbs instead of table salt or sea salt. Check with your health care provider or pharmacist before using salt substitutes. Cook with plant-based oils, such as canola, sunflower, or olive oil. Meal planning When eating at a restaurant, ask that your food be prepared with less salt or no salt, if possible. Avoid dishes labeled as brined, pickled, cured, smoked, or made with soy sauce, miso, or teriyaki sauce. Avoid foods that contain MSG (monosodium glutamate). MSG is sometimes added to [place] food, bouillon, and some canned foods. Make meals that can be grilled, baked, poached, roasted, or steamed. These are generally made with less sodium. General information Most people  on this plan should limit their sodium intake to 1,500-2,000 mg (milligrams) of sodium each day. What foods should I eat? Fruits Fresh, frozen, or canned fruit. Fruit juice. Vegetables Fresh or frozen vegetables. "No salt added" canned vegetables. "No salt added" tomato  sauce and paste. Low-sodium or reduced-sodium tomato and vegetable juice. Grains Low-sodium cereals, including oats, puffed wheat and rice, and shredded wheat. Low-sodium crackers. Unsalted rice. Unsalted pasta. Low-sodium bread. Whole-grain breads and whole-grain pasta. Meats and other proteins Fresh or frozen (no salt added) meat, poultry, seafood, and fish. Low-sodium canned tuna and salmon. Unsalted nuts. Dried peas, beans, and lentils without added salt. Unsalted canned beans. Eggs. Unsalted nut butters. Dairy Milk. Soy milk. Cheese that is naturally low in sodium, such as ricotta cheese, fresh mozzarella, or Swiss cheese. Low-sodium or reduced-sodium cheese. Cream cheese. Yogurt. Seasonings and condiments Fresh and dried herbs and spices. Salt-free seasonings. Low-sodium mustard and ketchup. Sodium-free salad dressing. Sodium-free light mayonnaise. Fresh or refrigerated horseradish. Lemon juice. Vinegar. Other foods Homemade, reduced-sodium, or low-sodium soups. Unsalted popcorn and pretzels. Low-salt or salt-free chips. The items listed above may not be a complete list of foods and beverages you can eat. Contact a dietitian for more information. What foods should I avoid? Vegetables Sauerkraut, pickled vegetables, and relishes. Olives. [place] fries. Onion rings. Regular canned vegetables (not low-sodium or reduced-sodium). Regular canned tomato sauce and paste (not low-sodium or reduced-sodium). Regular tomato and vegetable juice (not low-sodium or reduced-sodium). Frozen vegetables in sauces. Grains Instant hot cereals. Bread stuffing, pancake, and biscuit mixes. Croutons. Seasoned rice or pasta mixes. Noodle soup cups. Boxed or frozen macaroni and cheese. Regular salted crackers. Self-rising flour. Meats and other proteins Meat or fish that is salted, canned, smoked, spiced, or pickled. Precooked or cured meat, such as sausages or meat loaves. [name]. Ham. Pepperoni. Hot dogs. Corned beef.  Chipped beef. Salt pork. Jerky. Pickled herring. Anchovies and sardines. Regular canned tuna. Salted nuts. Dairy Processed cheese and cheese spreads. Hard cheeses. Cheese curds. Blue cheese. Feta cheese. String cheese. Regular cottage cheese. Buttermilk. Canned milk. Fats and oils Salted butter. Regular margarine. Ghee. Bacon fat. Seasonings and condiments Onion salt, garlic salt, seasoned salt, table salt, and sea salt. Canned and packaged gravies. Worcestershire sauce. Tartar sauce. Barbecue sauce. Teriyaki sauce. Soy sauce, including reduced-sodium. Steak sauce. Fish sauce. Oyster sauce. Cocktail sauce. Horseradish that you find on the shelf. Regular ketchup and mustard. Meat flavorings and tenderizers. Bouillon cubes. Hot sauce. Pre-made or packaged marinades. Pre-made or packaged taco seasonings. Relishes. Regular salad dressings. Salsa. Other foods Salted popcorn and pretzels. Corn chips and puffs. Potato and tortilla chips. Canned or dried soups. Pizza. Frozen entrees and pot pies. The items listed above may not be a complete list of foods and beverages you should avoid. Contact a dietitian for more information. Summary Eating less sodium can help lower your blood pressure, reduce swelling, and protect your heart, liver, and kidneys. Most people on this plan should limit their sodium intake to 1,500-2,000 mg (milligrams) of sodium each day. Canned, boxed, and frozen foods are high in sodium. Restaurant foods, fast foods, and pizza are also very high in sodium. You also get sodium by adding salt to food. Try to cook at home, eat more fresh fruits and vegetables, and eat less fast food and canned, processed, or prepared foods. This information is not intended to replace advice given to you by your health care provider. Make sure you discuss any questions you have with your health care  provider. Document Revised: 10/17/2019 Document Reviewed: 08/13/2019 Elsevier Patient Education  [place].

## 2022-07-18 NOTE — Progress Notes (Signed)
Chief Complaint: FU  Referring Provider:  Kerin Perna, NP      ASSESSMENT AND PLAN;   #1. Marginal ulcer on EGD 03/2022. 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  Being followed by hematology.  #3. 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 No ascites or HE. No EV on EGD 03/2022.  Plan: -rpt EGD/colon with miralax -Protonix 77m po BID to continue for now -Low salt, normal protein diet -Appt with Dawn Drazek.  -Check CBC, CMP, INR, autoimmune hepatitis panel (AMA, ASMA), ceruloplasmin, A1AT, celiac screen, AFP and GGT. -Check anti-HAV total Ab and HBsAb.  If neg, would recommend vaccination for hepatitis A and B.    HPI:    TKLOHE LOVERINGis a 46y.o. female  With HTN, IDA, obesity S/P RYGB x 2 (2010, then revision 2014 d/t marginal ulcers) with recurrent marginal ulcers, ETOH hepatitis (req prednisolone) with early liver cirrhosis, thrombocytopenia, CKD.  No ETOH since June 2023.  For FU Nausea with occ vomiting. No heartburn. The abdominal pain is much better. No fever chills or night sweats. She has been eating small but more frequently.  Jaundice has almost completely resolved. No itching or history of easy bruisability.  [facility]still in process of getting [facility]t.  Liver WU:    Latest Ref Rng & Units 07/12/2022   11:23 AM 07/03/2022    3:50 PM 06/23/2022    5:15 AM  Hepatic Function  Total Protein 6.5 - 8.1 g/dL 7.8  7.9    Albumin 3.5 - 5.0 g/dL 2.6   1.8   AST 15 - 41 U/L 54  66    ALT 0 - 44 U/L 19  22    Alk Phosphatase 38 - 126 U/L 167     Total Bilirubin 0.3 - 1.2 mg/dL 3.3  2.6     MELD 3.0: 23 at 06/22/2022  4:57 AM MELD-Na: 21 at 06/22/2022  4:57 AM Calculated from: Serum Creatinine: 1.49 mg/dL at 06/22/2022  4:57 AM Serum Sodium: 136  mmol/L at 06/22/2022  4:57 AM Total Bilirubin: 2.4 mg/dL at 06/22/2022  4:57 AM Serum Albumin: 1.9 g/dL at 06/22/2022  4:57 AM INR(ratio): 1.7 at 06/20/2022 10:19 PM Age at listing (hypothetical): 45 years Sex: Female at 06/22/2022  4:57 AM  Elevated ferritin: 687 with iron saturation of 34%-thought to be due to alcoholic liver disease. Elevated LDH at 200 Negative ANA Negative acute hepatitis panel October 2023     Latest Ref Rng & Units 07/12/2022   11:23 AM 07/03/2022    3:50 PM 06/23/2022    4:56 PM  CBC  WBC 4.0 - 10.5 K/uL 6.3  7.2    Hemoglobin 12.0 - 15.0 g/dL 10.7  11.0  8.7   Hematocrit 36.0 - 46.0 % 31.9  30.3  27.0   Platelets 150 - 400 K/uL 101  117        Past GI procedures  EGD 04/18/2022 - Esophageal plaques were found, suspicious for candidiasis. - Small hiatal hernia. - Roux-en-Y gastrojejunostomy with moderate marginal ulcer. Biopsied. - Treated with Diflucan, Protonix twice daily, Carafate  Patient never had colonoscopy.  [place] 1. Hepatic steatosis. Mildly nodular liver contour, concerning for cirrhosis. 2. Trace perihepatic ascites.  CT AP without contrast 04/10/2022 1. Severe fatty liver. 2. Artifact versus less likely mild acute  pancreatitis. Correlation with pancreatic enzymes recommended. 3. No bowel obstruction. Normal appendix. 4. Aortic Atherosclerosis (ICD10-I70.0).  Multiple previous EGDs.  Twice 2011 (multiple small ulcers at jejunal side of pouch with normal gastric pouch.  Twice in 2012 (anastomotic ulcer), once 2013 (anastomotic ulcer 15 mm sized), twice 2014, once 2017 (report not found) . Latest EGD was 09/2018 (only path reprt found: Gastric mucosa with no diagnostic abnormality, no H. 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  Past Medical History:  Diagnosis Date   Acid reflux    Anemia    Ulcer     Past Surgical History:  Procedure Laterality Date   ABDOMINAL SURGERY     abdominoplasty    BIOPSY  04/18/2022   Procedure: BIOPSY;  Surgeon: Jackquline Denmark, MD;  Location: [facility] ENDOSCOPY;  Service: Gastroenterology;;   BREAST BIOPSY Right 2013   CHOLECYSTECTOMY N/A 11/23/2019   Procedure: LAPAROSCOPIC CHOLECYSTECTOMY WITH INTRAOPERATIVE CHOLANGIOGRAM;  Surgeon: Mickeal Skinner, MD;  Location: [facility];  Service: General;  Laterality: N/A;   COSMETIC SURGERY     tummy tuck   ESOPHAGOGASTRODUODENOSCOPY (EGD) WITH PROPOFOL N/A 04/18/2022   Procedure: ESOPHAGOGASTRODUODENOSCOPY (EGD) WITH PROPOFOL;  Surgeon: Jackquline Denmark, MD;  Location: [facility];  Service: Gastroenterology;  Laterality: N/A;   FOOT SURGERY     HERNIA REPAIR     I & D EXTREMITY Left 06/21/2022   Procedure: IRRIGATION AND DEBRIDEMENT EXTREMITY Debridement of Left Foot/APPLICATION OF GRAFT;  Surgeon: Yevonne Pax, DPM;  Location: [facility];  Service: Podiatry;  Laterality: Left;   ROUX-EN-Y PROCEDURE     TUMOR REMOVAL     WRIST GANGLION EXCISION      Family History  Problem Relation Age of Onset   Cancer Mother        kidney   Cancer Paternal Aunt    Breast cancer Maternal Aunt    Pancreatic cancer Maternal Aunt    Lung cancer Paternal Uncle     Social History   Tobacco Use   Smoking status: Every Day    Packs/day: 0.25    Years: 25.00    Total pack years: 6.25    Types: Cigarettes    Passive exposure: Current   Smokeless tobacco: Never  Vaping Use   Vaping Use: Never used  Substance Use Topics   Alcohol use: Not Currently    Comment: previously consumed 1-2 glasses of wine per night   Drug use: No    Current Outpatient Medications  Medication Sig Dispense Refill   amoxicillin-clavulanate (AUGMENTIN) 875-125 MG tablet Take 1 tablet by mouth 2 (two) times daily for 27 days. 54 tablet 0   busPIRone (BUSPAR) 15 MG tablet Take 15 mg by mouth daily as needed (anxiety).     Cyanocobalamin (B-12 PO) Take 1 tablet by mouth daily.     doxycycline (VIBRA-TABS) 100 MG tablet Take 1 tablet  (100 mg total) by mouth 2 (two) times daily with a meal. 40 tablet 0   ferrous sulfate 325 (65 FE) MG tablet Take 1 tablet (325 mg total) by mouth daily with breakfast. 90 tablet 1   folic acid (FOLVITE) 1 MG tablet Take 1 tablet (1 mg total) by mouth daily. 30 tablet 6   furosemide (LASIX) 20 MG tablet Take 1 tablet (20 mg total) by mouth daily. 30 tablet 11   lactulose (CHRONULAC) 10 GM/15ML solution Take 30 mLs (20 g total) by mouth 3 (three) times daily as needed  for mild constipation or moderate constipation. 473 mL 1   loperamide (IMODIUM) 2 MG capsule Take 1 capsule (2 mg total) by mouth as needed for diarrhea or loose stools. 30 capsule 0   Multiple Vitamin (MULTIVITAMIN) tablet Take 1 tablet by mouth daily.     nicotine polacrilex (NICORETTE) 2 MG gum Take 1 each (2 mg total) by mouth as needed for smoking cessation. 100 tablet 0   ondansetron (ZOFRAN) 8 MG tablet Take 1 tablet (8 mg total) by mouth every 8 (eight) hours as needed. 30 tablet 0   pantoprazole (PROTONIX) 40 MG tablet Take 1 tablet (40 mg total) by mouth 2 (two) times daily before a meal. 60 tablet 1   sertraline (ZOLOFT) 50 MG tablet Take 50 mg by mouth daily.     spironolactone (ALDACTONE) 50 MG tablet Take 1 tablet (50 mg total) by mouth daily. 30 tablet 1   No current facility-administered medications for this visit.    Allergies  Allergen Reactions   Tape Rash    Clear tape    Review of Systems:  Constitutional: Denies fever, chills, diaphoresis, appetite change and fatigue.  HEENT: Denies photophobia, eye pain, redness, hearing loss, ear pain, congestion, sore throat, rhinorrhea, sneezing, mouth sores, neck pain, neck stiffness and tinnitus.   Respiratory: Denies SOB, DOE, cough, chest tightness,  and wheezing.   Cardiovascular: Denies chest pain, palpitations and leg swelling.  Genitourinary: Denies dysuria, urgency, frequency, hematuria, flank pain and difficulty urinating.  Musculoskeletal: Denies myalgias,  back pain, joint swelling, arthralgias and gait problem.  Skin: No rash.  Neurological: Denies dizziness, seizures, syncope, weakness, light-headedness, numbness and headaches.  Hematological: Denies adenopathy. Easy bruising, personal or family bleeding history  Psychiatric/Behavioral: No anxiety or depression     Physical Exam:    BP 122/80   Pulse (!) 105   Ht '5\' 6"'  (1.676 m)   Wt 184 lb 4 oz (83.6 kg)   LMP 02/23/2022 (Within Weeks)   SpO2 100%   BMI 29.74 kg/m  Wt Readings from Last 3 Encounters:  07/18/22 184 lb 4 oz (83.6 kg)  07/12/22 186 lb 3.2 oz (84.5 kg)  07/03/22 187 lb (84.8 kg)   Constitutional:  Well-developed, in no acute distress. Psychiatric: Normal mood and affect. Behavior is normal. HEENT: Pupils normal.  Conjunctivae are normal. No scleral icterus. Neck supple.  Cardiovascular: Normal rate, regular rhythm. No edema Pulmonary/chest: Effort normal and breath sounds normal. No wheezing, rales or rhonchi. Abdominal: Soft, nondistended. Nontender. Bowel sounds active throughout. There are no masses palpable. No hepatomegaly. Rectal: Deferred Neurological: Alert and oriented to person place and time. Skin: Skin is warm and dry. No rashes noted.  Data Reviewed: I have personally reviewed following labs and imaging studies  CBC:    Latest Ref Rng & Units 07/12/2022   11:23 AM 07/03/2022    3:50 PM 06/23/2022    4:56 PM  CBC  WBC 4.0 - 10.5 K/uL 6.3  7.2    Hemoglobin 12.0 - 15.0 g/dL 10.7  11.0  8.7   Hematocrit 36.0 - 46.0 % 31.9  30.3  27.0   Platelets 150 - 400 K/uL 101  117      CMP:    Latest Ref Rng & Units 07/12/2022   11:23 AM 07/03/2022    3:50 PM 06/23/2022    5:15 AM  CMP  Glucose 70 - 99 mg/dL 88  84  77   BUN 6 - 20 mg/dL 9  8  8  Creatinine 0.44 - 1.00 mg/dL 1.33  1.21  1.35   Sodium 135 - 145 mmol/L 136  138  136   Potassium 3.5 - 5.1 mmol/L 3.7  3.3  3.7   Chloride 98 - 111 mmol/L 107  109  112   CO2 22 - 32 mmol/L '24  21  19    ' Calcium 8.9 - 10.3 mg/dL 9.0  8.9  8.1   Total Protein 6.5 - 8.1 g/dL 7.8  7.9    Total Bilirubin 0.3 - 1.2 mg/dL 3.3  2.6    Alkaline Phos 38 - 126 U/L 167     AST 15 - 41 U/L 54  66    ALT 0 - 44 U/L 19  22      GFR: Estimated Creatinine Clearance: 58.2 mL/min (A) (by C-G formula based on SCr of 1.33 mg/dL (H)). Liver Function Tests: Recent Labs  Lab 07/12/22 1123  AST 54*  ALT 19  ALKPHOS 167*  BILITOT 3.3*  PROT 7.8  ALBUMIN 2.6*   No results for input(s): "LIPASE", "AMYLASE" in the last 168 hours. No results for input(s): "AMMONIA" in the last 168 hours. Coagulation Profile: No results for input(s): "INR", "PROTIME" in the last 168 hours. HbA1C: No results for input(s): "HGBA1C" in the last 72 hours. Lipid Profile: No results for input(s): "CHOL", "HDL", "LDLCALC", "TRIG", "CHOLHDL", "LDLDIRECT" in the last 72 hours. Thyroid Function Tests: No results for input(s): "TSH", "T4TOTAL", "FREET4", "T3FREE", "THYROIDAB" in the last 72 hours. Anemia Panel: No results for input(s): "VITAMINB12", "FOLATE", "FERRITIN", "TIBC", "IRON", "RETICCTPCT" in the last 72 hours.  No results found for this or any previous visit (from the past 240 hour(s)).    Radiology Studies: MR FOOT LEFT WO CONTRAST  Result Date: 06/20/2022 CLINICAL DATA:  Foot swelling, osteomyelitis suspected. Evaluate for abscess. EXAM: MRI OF THE LEFT FOOT WITHOUT CONTRAST TECHNIQUE: Multiplanar, multisequence MR imaging of the left foot was performed. No intravenous contrast was administered. COMPARISON:  Radiographs dated June 20, 2022 FINDINGS: Bones/Joint/Cartilage Marrow signal is within normal limits. No evidence of marrow edema to suggest osteomyelitis. No appreciable joint effusion or significant arthropathy. Ligaments Lisfranc and collateral ligaments are intact. Muscles and Tendons Plantar muscles are normal in signal. No intramuscular collection or abscess. The tendons of the flexor and extensor  compartments are intact, evaluation of tendons is however somewhat limited due to motion. Soft tissues Skin irregularity and marked subcutaneous soft tissue edema about the dorsum of the foot. No fluid collection or abscess on this noncontrast enhanced examination. IMPRESSION: 1.  No evidence of osteomyelitis. 2. Marked skin thickening and subcutaneous soft tissue edema about dorsum of the foot without evidence of drainable fluid collection or abscess on this noncontrast enhanced examination. Electronically Signed   By: Keane Police D.O.   On: 06/20/2022 21:28   DG Foot Complete Left  Result Date: 06/20/2022 CLINICAL DATA:  Left foot wound. EXAM: LEFT FOOT - COMPLETE 3+ VIEW COMPARISON:  None Available. FINDINGS: Deep dorsal lateral forefoot soft tissue ulceration with prominent soft tissue swelling. No bony destruction or periosteal reaction. No acute fracture or dislocation. Joint spaces are preserved. Bone mineralization is normal. IMPRESSION: 1. Deep dorsal lateral forefoot soft tissue ulceration without radiographic evidence of osteomyelitis. Electronically Signed   By: Titus Dubin M.D.   On: 06/20/2022 14:36      Carmell Austria, MD 07/18/2022, 2:47 PM  Cc: Kerin Perna, NP

## 2022-07-19 ENCOUNTER — Ambulatory Visit (INDEPENDENT_AMBULATORY_CARE_PROVIDER_SITE_OTHER): Payer: Self-pay | Admitting: Infectious Disease

## 2022-07-19 ENCOUNTER — Ambulatory Visit (HOSPITAL_BASED_OUTPATIENT_CLINIC_OR_DEPARTMENT_OTHER): Payer: Self-pay | Admitting: General Surgery

## 2022-07-19 ENCOUNTER — Other Ambulatory Visit: Payer: Self-pay

## 2022-07-19 ENCOUNTER — Telehealth: Payer: Self-pay

## 2022-07-19 ENCOUNTER — Ambulatory Visit: Payer: Self-pay | Admitting: Infectious Disease

## 2022-07-19 VITALS — BP 111/73 | HR 109 | Temp 98.1°F | Resp 16 | Wt 185.0 lb

## 2022-07-19 DIAGNOSIS — Z720 Tobacco use: Secondary | ICD-10-CM

## 2022-07-19 DIAGNOSIS — L97923 Non-pressure chronic ulcer of unspecified part of left lower leg with necrosis of muscle: Secondary | ICD-10-CM

## 2022-07-19 DIAGNOSIS — G621 Alcoholic polyneuropathy: Secondary | ICD-10-CM

## 2022-07-19 NOTE — Telephone Encounter (Signed)
Spoke to patient and she said she will try to come this week or next to do some lab work. She will come to the basement level of our [place] office to get done.

## 2022-07-19 NOTE — Progress Notes (Signed)
Subjective:  Chief complaint follow-up for dorsal foot wound the left  Patient ID: [name], female    DOB: 1976/07/24, 46 y.o.   MRN: [id number]  HPI  female with alcoholism with cirrhosis, admission for alcohol but colic hepatitis iron deficiency anemia tobacco abuse who has had a wound in her left foot that has been present since JULUCA June 2023.  She associates the development of the wound with the particular footwear that she was wearing that apparently cut into the skin on the dorsal aspect of her foot.   She has been on various courses of antibiotics including ciprofloxacin and Flagyl Augmentin and doxycycline.   She has been followed closely by wound care.   Prior to admission she had worsening swelling in the left foot with worsening erythema around the opening tenderness no drainage.   He came to the ER where blood cultures were taken.   She was started on broad-spectrum antibiotics in the form of vancomycin cefepime and Flagyl.   Right the foot was performed which showed no evidence of osteomyelitis.   Is taken to the operating room by Dr. Loel Lofty with podiatry who performed irrigation and excisional debridement of her dorsal foot ulcers with debridement to the level of subcutaneous fat tissue and extensor tendons with application of skin substitute graft.  Cultures from wound did not yield an organism.  We had her get a dose of oritavancin had to go out on Augmentin.  She then followed by partner Dr. Candiss Norse with consideration of adding doxycycline.  Doxycycline was added.  The patient has been taking doxycycline she has a fair amount of nausea with it.  Her wound appears to be healing up well with healthy granulation tissue present.      Past Medical History:  Diagnosis Date   Acid reflux    Anemia    Ulcer     Past Surgical History:  Procedure Laterality Date   ABDOMINAL SURGERY     abdominoplasty   BIOPSY  04/18/2022   Procedure: BIOPSY;  Surgeon:  Jackquline Denmark, MD;  Location: [facility] END[facility];  Service: Gastroenterology;;   BREAST BIOPSY Right 2013   CHOLECYSTECTOMY N/A 11/23/2019   Procedure: LAPAROSCOPIC CHOLECYSTECTOMY WITH INTRAOPERATIVE CHOLANGIOGRAM;  Surgeon: Mickeal Skinner, MD;  Location: [place];  Service: General;  Laterality: N/A;   COSMETIC SURGERY     tummy tuck   ESOPHAGOGASTRODUODENOSCOPY (EGD) WITH PROPOFOL N/A 04/18/2022   Procedure: ESOPHAGOGASTRODUODENOSCOPY (EGD) WITH PROPOFOL;  Surgeon: Jackquline Denmark, MD;  Location: [place];  Service: Gastroenterology;  Laterality: N/A;   FOOT SURGERY     HERNIA REPAIR     I & D EXTREMITY Left 06/21/2022   Procedure: IRRIGATION AND DEBRIDEMENT EXTREMITY Debridement of Left Foot/APPLICATION OF GRAFT;  Surgeon: Yevonne Pax, DPM;  Location: [facility];  Service: Podiatry;  Laterality: Left;   ROUX-EN-Y PROCEDURE     TUMOR REMOVAL     WRIST GANGLION EXCISION      Family History  Problem Relation Age of Onset   Cancer Mother        kidney   Cancer Paternal Aunt    Breast cancer Maternal Aunt    Pancreatic cancer Maternal Aunt    Lung cancer Paternal Uncle       Social History   Socioeconomic History   Marital status: Single    Spouse name: Not on file   Number of children: 1   Years of education: Not on file   Highest education level: Not  on file  Occupational History    Employer: [employer]    Comment: [employer]  Tobacco Use   Smoking status: Every Day    Packs/day: 0.25    Years: 25.00    Total pack years: 6.25    Types: Cigarettes    Passive exposure: Current   Smokeless tobacco: Never  Vaping Use   Vaping Use: Never used  Substance and Sexual Activity   Alcohol use: Not Currently    Comment: previously consumed 1-2 glasses of wine per night   Drug use: No   Sexual activity: Yes    Birth control/protection: None  Other Topics Concern   Not on file  Social History Narrative   Not on file   Social Determinants of Health   Financial Resource  Strain: Not on file  Food Insecurity: No Food Insecurity (06/20/2022)   Hunger Vital Sign    Worried About Running Out of Food in the Last Year: Never true    Ran Out of Food in the Last Year: Never true  Transportation Needs: No Transportation Needs (06/20/2022)   PRAPARE - Hydrologist (Medical): No    Lack of Transportation (Non-Medical): No  Physical Activity: Not on file  Stress: Not on file  Social Connections: Not on file    Allergies  Allergen Reactions   Tape Rash    Clear tape     Current Outpatient Medications:    amoxicillin-clavulanate (AUGMENTIN) 875-125 MG tablet, Take 1 tablet by mouth 2 (two) times daily for 27 days., Disp: 54 tablet, Rfl: 0   busPIRone (BUSPAR) 15 MG tablet, Take 15 mg by mouth daily as needed (anxiety)., Disp: , Rfl:    Cyanocobalamin (B-12 PO), Take 1 tablet by mouth daily., Disp: , Rfl:    doxycycline (VIBRA-TABS) 100 MG tablet, Take 1 tablet (100 mg total) by mouth 2 (two) times daily with a meal., Disp: 40 tablet, Rfl: 0   ferrous sulfate 325 (65 FE) MG tablet, Take 1 tablet (325 mg total) by mouth daily with breakfast., Disp: 90 tablet, Rfl: 1   folic acid (FOLVITE) 1 MG tablet, Take 1 tablet (1 mg total) by mouth daily., Disp: 30 tablet, Rfl: 6   furosemide (LASIX) 20 MG tablet, Take 1 tablet (20 mg total) by mouth daily., Disp: 30 tablet, Rfl: 11   lactulose (CHRONULAC) 10 GM/15ML solution, Take 30 mLs (20 g total) by mouth 3 (three) times daily as needed for mild constipation or moderate constipation., Disp: 473 mL, Rfl: 1   loperamide (IMODIUM) 2 MG capsule, Take 1 capsule (2 mg total) by mouth as needed for diarrhea or loose stools., Disp: 30 capsule, Rfl: 0   Multiple Vitamin (MULTIVITAMIN) tablet, Take 1 tablet by mouth daily., Disp: , Rfl:    nicotine polacrilex (NICORETTE) 2 MG gum, Take 1 each (2 mg total) by mouth as needed for smoking cessation., Disp: 100 tablet, Rfl: 0   ondansetron (ZOFRAN) 8 MG tablet,  Take 1 tablet (8 mg total) by mouth every 8 (eight) hours as needed., Disp: 30 tablet, Rfl: 0   pantoprazole (PROTONIX) 40 MG tablet, Take 1 tablet (40 mg total) by mouth 2 (two) times daily before a meal., Disp: 60 tablet, Rfl: 1   sertraline (ZOLOFT) 50 MG tablet, Take 50 mg by mouth daily., Disp: , Rfl:    spironolactone (ALDACTONE) 50 MG tablet, Take 1 tablet (50 mg total) by mouth daily., Disp: 30 tablet, Rfl: 1   Review of Systems  Constitutional:  Negative for activity change, appetite change, chills, diaphoresis, fatigue, fever and unexpected weight change.  HENT:  Negative for congestion, rhinorrhea, sinus pressure, sneezing, sore throat and trouble swallowing.   Eyes:  Negative for photophobia and visual disturbance.  Respiratory:  Negative for cough, chest tightness, shortness of breath, wheezing and stridor.   Cardiovascular:  Negative for chest pain, palpitations and leg swelling.  Gastrointestinal:  Positive for nausea. Negative for abdominal distention, abdominal pain, anal bleeding, blood in stool, constipation, diarrhea and vomiting.  Genitourinary:  Negative for difficulty urinating, dysuria, flank pain and hematuria.  Musculoskeletal:  Negative for arthralgias, back pain, gait problem, joint swelling and myalgias.  Skin:  Positive for wound. Negative for color change, pallor and rash.  Neurological:  Negative for dizziness, tremors, weakness and light-headedness.  Hematological:  Negative for adenopathy. Does not bruise/bleed easily.  Psychiatric/Behavioral:  Negative for agitation, behavioral problems, confusion, decreased concentration, dysphoric mood and sleep disturbance.        Objective:   Physical Exam Constitutional:      General: She is not in acute distress.    Appearance: She is not diaphoretic.  HENT:     Head: Normocephalic and atraumatic.     Right Ear: External ear normal.     Left Ear: External ear normal.     Nose: Nose normal.     Mouth/Throat:      Pharynx: No oropharyngeal exudate.  Eyes:     General: No scleral icterus.       Right eye: No discharge.        Left eye: No discharge.     Extraocular Movements: Extraocular movements intact.     Conjunctiva/sclera: Conjunctivae normal.  Cardiovascular:     Rate and Rhythm: Normal rate and regular rhythm.     Heart sounds:     No friction rub.  Pulmonary:     Effort: Pulmonary effort is normal. No respiratory distress.     Breath sounds: No wheezing or rales.  Abdominal:     General: There is no distension.     Palpations: Abdomen is soft.     Tenderness: There is no rebound.  Musculoskeletal:        General: No tenderness. Normal range of motion.     Cervical back: Normal range of motion and neck supple.  Lymphadenopathy:     Cervical: No cervical adenopathy.  Skin:    General: Skin is warm and dry.     Coloration: Skin is not jaundiced or pale.     Findings: No erythema, lesion or rash.  Neurological:     General: No focal deficit present.     Mental Status: She is alert and oriented to person, place, and time.     Coordination: Coordination normal.  Psychiatric:        Mood and Affect: Mood normal.        Behavior: Behavior normal.        Thought Content: Thought content normal.        Judgment: Judgment normal.    Wound 07/19/2022:          Assessment & Plan:   Dorsal foot wound with exposed tendon sp surgery by podiatry  There was no osteomyelitis and the wound appears to be healing up well currently under current strategies wound care.  I will have her stop her Augmentin and doxycycline.  She can return to clinic as needed.  Vaccine counseling: recommended COVID 19 and flu shots  I  spent 99minutes with the patient including than 50% of the time in face to face counseling of the patient regarding her wound and the concept of alcoholic neuropathy along with review of medical records in preparation for the visit and during the visit and in coordination of  her care.

## 2022-07-20 ENCOUNTER — Encounter (HOSPITAL_BASED_OUTPATIENT_CLINIC_OR_DEPARTMENT_OTHER): Payer: Self-pay | Admitting: General Surgery

## 2022-07-20 ENCOUNTER — Telehealth: Payer: Self-pay | Admitting: *Deleted

## 2022-07-20 ENCOUNTER — Inpatient Hospital Stay (INDEPENDENT_AMBULATORY_CARE_PROVIDER_SITE_OTHER): Payer: Self-pay | Admitting: Primary Care

## 2022-07-20 NOTE — Telephone Encounter (Signed)
-----   Message from Orson Slick, MD sent at 07/16/2022  8:03 PM EDT ----- Regarding: P2 Please let [name] know that her iron levels appear to be within normal limits.  At this time I believe her hemoglobin is low because of increased inflammatory markers.  This may be due to the wound she has on her left leg.  At this time would recommend following up as scheduled in 6 months time.  If she were to have any worsening issues in the interim we will be happy to check her blood earlier. ----- Message ----- From: Buel Ream, Lab In [place] Sent: 07/12/2022  12:08 PM EDT To: Orson Slick, MD

## 2022-07-20 NOTE — Telephone Encounter (Signed)
TCT patient regarding her recent lab results. Per Dr. Lorenso Courier, he believes her low HGB is due to inflammatory markers from her wound on her left leg.  Advised that we will see her back in 6 months. She does not need iv iron at this time.

## 2022-07-21 NOTE — Telephone Encounter (Signed)
Referral sent successfully to [facility] at [phone]. Office stated no referral form needed from that office

## 2022-07-24 ENCOUNTER — Other Ambulatory Visit (INDEPENDENT_AMBULATORY_CARE_PROVIDER_SITE_OTHER): Payer: Self-pay

## 2022-07-24 ENCOUNTER — Encounter (HOSPITAL_BASED_OUTPATIENT_CLINIC_OR_DEPARTMENT_OTHER): Payer: Self-pay | Admitting: General Surgery

## 2022-07-24 DIAGNOSIS — K7030 Alcoholic cirrhosis of liver without ascites: Secondary | ICD-10-CM

## 2022-07-24 DIAGNOSIS — D509 Iron deficiency anemia, unspecified: Secondary | ICD-10-CM

## 2022-07-24 DIAGNOSIS — K289 Gastrojejunal ulcer, unspecified as acute or chronic, without hemorrhage or perforation: Secondary | ICD-10-CM

## 2022-07-24 LAB — COMPREHENSIVE METABOLIC PANEL
ALT: 18 U/L (ref 0–35)
AST: 48 U/L — ABNORMAL HIGH (ref 0–37)
Albumin: 2.5 g/dL — ABNORMAL LOW (ref 3.5–5.2)
Alkaline Phosphatase: 165 U/L — ABNORMAL HIGH (ref 39–117)
BUN: 10 mg/dL (ref 6–23)
CO2: 21 mEq/L (ref 19–32)
Calcium: 8.9 mg/dL (ref 8.4–10.5)
Chloride: 102 mEq/L (ref 96–112)
Creatinine, Ser: 1.44 mg/dL — ABNORMAL HIGH (ref 0.40–1.20)
GFR: 44.07 mL/min — ABNORMAL LOW (ref >60.00)
Glucose, Bld: 86 mg/dL (ref 70–99)
Potassium: 4.1 mEq/L (ref 3.5–5.1)
Sodium: 133 mEq/L — ABNORMAL LOW (ref 135–145)
Total Bilirubin: 2.5 mg/dL — ABNORMAL HIGH (ref 0.2–1.2)
Total Protein: 7.6 g/dL (ref 6.0–8.3)

## 2022-07-24 LAB — PROTIME-INR
INR: 1.6 ratio — ABNORMAL HIGH (ref 0.8–1.0)
Prothrombin Time: 17.1 s — ABNORMAL HIGH (ref 9.6–13.1)

## 2022-07-24 LAB — CBC WITH DIFFERENTIAL/PLATELET
Basophils Absolute: 0.1 10*3/uL (ref 0.0–0.1)
Basophils Relative: 0.8 % (ref 0.0–3.0)
Eosinophils Absolute: 0.2 10*3/uL (ref 0.0–0.7)
Eosinophils Relative: 1.9 % (ref 0.0–5.0)
HCT: 29 % — ABNORMAL LOW (ref 36.0–46.0)
Hemoglobin: 9.8 g/dL — ABNORMAL LOW (ref 12.0–15.0)
Lymphocytes Relative: 23.7 % (ref 12.0–46.0)
Lymphs Abs: 2 10*3/uL (ref 0.7–4.0)
MCHC: 33.7 g/dL (ref 30.0–36.0)
MCV: 99.5 fl (ref 78.0–100.0)
Monocytes Absolute: 0.8 10*3/uL (ref 0.1–1.0)
Monocytes Relative: 9.7 % (ref 3.0–12.0)
Neutro Abs: 5.3 10*3/uL (ref 1.4–7.7)
Neutrophils Relative %: 63.9 % (ref 43.0–77.0)
Platelets: 87 10*3/uL — ABNORMAL LOW (ref 150.0–400.0)
RBC: 2.92 Mil/uL — ABNORMAL LOW (ref 3.87–5.11)
RDW: 16.4 % — ABNORMAL HIGH (ref 11.5–15.5)
WBC: 8.4 10*3/uL (ref 4.0–10.5)

## 2022-07-24 LAB — FUNGUS CULTURE WITH STAIN

## 2022-07-24 LAB — FUNGAL ORGANISM REFLEX

## 2022-07-24 LAB — FUNGUS CULTURE RESULT

## 2022-07-24 LAB — GAMMA GT: GGT: 79 U/L — ABNORMAL HIGH (ref 7–51)

## 2022-07-25 NOTE — Progress Notes (Signed)
[name], [name] ([id number]) [id number] Page 1 of 9 Visit Report for 07/24/2022 Chief Complaint Document Details Patient Name: Date of Service: [name], [name]. 07/24/2022 3:30 PM Medical Record Number: [id number] Patient Account Number: [id number] Date of Birth/Sex: Treating RN: 1976-09-25 (46 y.o. F) Primary Care Provider: Gwinda PasseEdwards, Lori Other Clinician: Referring Provider: Treating Provider/Extender: [name], [name] Chambers, Lori Weeks in Treatment: 8 Information Obtained from: Patient Chief Complaint Patient seen for complaints of Non-Healing Wounds. Electronic Signature(s) Signed: 07/24/2022 4:10:21 PM By: Duanne Guessannon, Halley Kincer MD FACS Entered By: Duanne Guessannon, Kyonna Frier on 07/24/2022 16:10:21 -------------------------------------------------------------------------------- Debridement Details Patient Name: Date of Service: [name], [name]. 07/24/2022 3:30 PM Medical Record Number: [id number] Patient Account Number: [id number] Date of Birth/Sex: Treating RN: 1976-09-25 (46 y.o. [name]) Chambers, Linda Primary Care Provider: Gwinda PasseEdwards, Lori Other Clinician: Referring Provider: Treating Provider/Extender: [name], Pinkey Mcjunkin Chambers, Lori Weeks in Treatment: 8 Debridement Performed for Assessment: Wound #1 Left,Dorsal Foot Performed By: Physician Duanne Guessannon, Nesa Distel, MD Debridement Type: Debridement Level of Consciousness (Pre-procedure): Awake and Alert Pre-procedure Verification/Time Out Yes - 16:05 Taken: Start Time: 16:07 Pain Control: Lidocaine 4% T opical Solution T Area Debrided (L x W): otal 1.8 (cm) x 3.5 (cm) = 6.3 (cm) Tissue and other material debrided: Non-Viable, Slough, Slough Level: Non-Viable Tissue Debridement Description: Selective/Open Wound Instrument: Curette Bleeding: Minimum Hemostasis Achieved: Pressure Procedural Pain: 3 Post Procedural Pain: 2 Response to Treatment: Procedure was tolerated well Level of  Consciousness (Post- Awake and Alert procedure): Post Debridement Measurements of Total Wound Length: (cm) 1.8 Width: (cm) 3.5 Depth: (cm) 0.1 Volume: (cm) 0.495 Character of Wound/Ulcer Post Debridement: Improved Post Procedure Diagnosis Same as Pre-procedure [name], [name] ([id number]) [id number] Page 2 of 9 Notes scribed for Dr. Lady Garyannon by Lori DeedLinda Boehlein, RN Electronic Signature(s) Signed: 07/24/2022 4:18:21 PM By: Duanne Guessannon, Tariana Moldovan MD FACS Signed: 07/24/2022 5:08:53 PM By: Lori DeedBoehlein, Linda RN, BSN Entered By: Lori DeedBoehlein, Linda on 07/24/2022 16:12:53 -------------------------------------------------------------------------------- HPI Details Patient Name: Date of Service: [name], [name]. 07/24/2022 3:30 PM Medical Record Number: [id number] Patient Account Number: [id number] Date of Birth/Sex: Treating RN: 1976-09-25 (46 y.o. F) Primary Care Provider: Gwinda PasseEdwards, Lori Other Clinician: Referring Provider: Treating Provider/Extender: [name], [name] Chambers, Lori Weeks in Treatment: 8 History of Present Illness HPI Description: ADMISSION 05/24/2022 This is a 46 year old woman with a past medical history significant for hypertension, alcoholic hepatitis, chronic anemia, chronic kidney disease, and peptic ulcer disease. She was admitted to the hospital in the middle of July for hypovolemic shock secondary to dehydration from gastroenteritis. She was found on admission to have an ulcer on her left buttock within the confines of the natal cleft and approaching the anal verge. The etiology was felt to be moisture and friction rather than pressure, as the patient is completely ambulatory. No debridement was performed. She was readmitted at the beginning of August with worsening lower extremity edema and fatigue. The wound was basically unchanged. Wound nursing was consulted and they recommended using silver alginate. She was discharged from the  hospital with referral to the wound care center. This past weekend, she noted a wound on her dorsal left foot that she thinks was secondary to friction from shoes. She is here for evaluation of both these wounds. ABI in clinic today was 1.18. She reports that she has quit drinking but she continues to smoke. On the dorsal surface of her left foot between her fourth and fifth metatarsal heads, there is a circular wound exposing the fat layer. There is some slough and eschar accumulation. It does appear  consistent with her history of abrasion. On the medial aspect of her left buttock extending into the natal cleft and towards the anal verge. There is a large ulcer with a very clean surface. There is substantial undermining at the cranial portion of the wound. No purulent drainage or malodor. 06/08/2022: The patient returns today with significant worsening of her dorsal foot wound. Apparently she saw her PCP yesterday who was concerned for cellulitis but did not prescribe any antibiotics. She was also sent for a DVT scan which was negative. There is heavy slough as well as liquefactive necrosis of the fat layer. There is a bulla between the second and third toes that looks as though it may rupture in the near future. The wound undermines under the skin towards this bulla. No frankly purulent drainage or any odor. Her gluteal wound is very clean but is relatively unchanged in size. She has been managing her wounds on her own and finds it difficult to pack the silver alginate into the undermined portion of the buttock wound. She says that she has been putting peroxide on her foot. 06/19/2022: The patient was not seen as a wound care visit last week because she arrived too late to her appointment. She did have a nursing visit, however. The culture that I took on 14 September grew out a polymicrobial population including Bacteroides fragilis Citrobacter Morganella Escherichia coli Enterococcus faecalis  Pseudomonas aeruginosa and many others. I prescribed Bactrim and Augmentin for this polymicrobial infection based upon the resistance genes detected. Keystone topical compounded antibiotic was also ordered and she has been using that on her foot as well. Unfortunately, the wound on her dorsal foot continues to deteriorate. There is a larger area of undermining and more necrotic tissue, including muscle, on the wound surface. No significant malodor or frank pus. Her gluteal wound, on the other hand, looks quite good. It is smaller and very clean and the undermined portion has contracted substantially. 06/27/2022: Last week after our visit, she went to the hospital where she was admitted with sepsis. She underwent surgical debridement of her dorsal foot wound. Cultures taken intraoperatively were negative and a piece of PuraPly was sutured in place. She is currently taking Augmentin as prescribed on discharge. Her gluteal wound continues to contract. It is more superficial and there is even less undermining. She does have some biofilm accumulation on the surface. The dorsal foot wound is stained yellow from the Xeroform that was applied by podiatry. PuraPly is covering about a third of the wound. The rest of the wound surface is very clean. She did have quite a bit of drainage, but it was not purulent or malodorous. 07/05/2022: The wound on her buttocks continues to contract. It is very clean without any accumulation of slough or other debris. The PuraPly that was on her foot came off with her dressing. There are a couple of sutures remaining around the perimeter of the wound. There is slough buildup but no signs of necrosis. Granulation tissue is starting to form. 10/19; the patient has 2 wounds in the left buttock in the gluteal fold which was a pressure ulcer from a stay in hospital during the summer. She has a more recent wound on the left dorsal foot which was apparently complications of infection.  Both wound surfaces look healthy. She is using silver alginate on the left foot 07/24/2022: I have not seen her in 2 weeks and both wounds have contracted considerably since then. The dorsal foot wound has come  in by a centimeter in width since her last visit. The gluteal wound is quite clean; there is a bit of slough buildup on the dorsal foot wound. Electronic Signature(s) Signed: 07/24/2022 4:12:14 PM By: Duanne Guess MD FACS Entered By: Duanne Guess on 07/24/2022 16:12:14 Lori Balsam ([id number]) [id number] Page 3 of 9 -------------------------------------------------------------------------------- Physical Exam Details Patient Name: Date of Service: [name], [name] 07/24/2022 3:30 PM Medical Record Number: [id number] Patient Account Number: [id number] Date of Birth/Sex: Treating RN: 1976-02-16 (46 y.o. F) Primary Care Provider: Gwinda Passe Other Clinician: Referring Provider: Treating Provider/Extender: Lori Nimrod in Treatment: 8 Constitutional .Tachycardic, asymptomatic.. . . No acute distress.Marland Kitchen Respiratory Normal work of breathing on room air.. Notes 07/24/2022: I have not seen her in 2 weeks and both wounds have contracted considerably since then. The dorsal foot wound has come in by a centimeter in width since her last visit. The gluteal wound is quite clean; there is a bit of slough buildup on the dorsal foot wound. Electronic Signature(s) Signed: 07/24/2022 4:12:50 PM By: Duanne Guess MD FACS Entered By: Duanne Guess on 07/24/2022 16:12:49 -------------------------------------------------------------------------------- Physician Orders Details Patient Name: Date of Service: [name]. 07/24/2022 3:30 PM Medical Record Number: [id number] Patient Account Number: [id number] Date of Birth/Sex: Treating RN: 1976-01-19 (46 y.o. Billy Coast, Linda Primary Care Provider: Gwinda Passe Other Clinician: Referring Provider: Treating Provider/Extender: Lori Nimrod in Treatment: 8 Verbal / Phone Orders: No Diagnosis Coding ICD-10 Coding Code Description [phone] Non-pressure chronic ulcer of other part of left foot with fat layer exposed L98.412 Non-pressure chronic ulcer of buttock with fat layer exposed N18.9 Chronic kidney disease, unspecified K70.10 Alcoholic hepatitis without ascites Follow-up Appointments ppointment in 1 week. - Dr. Lady Gary - Rm 4 Return A Bathing/ Shower/ Hygiene May shower with protection but do not get wound dressing(s) wet. Edema Control - Lymphedema / SCD / Other Elevate legs to the level of the heart or above for 30 minutes daily and/or when sitting, a frequency of: Avoid standing for long periods of time. Wound Treatment Wound #1 - Foot Wound Laterality: Dorsal, Left Cleanser: Soap and Water Every Other Day/30 Days Discharge Instructions: May shower and wash wound with dial antibacterial soap and water prior to dressing change. Cleanser: Wound Cleanser Every Other Day/30 Days Discharge Instructions: Cleanse the wound with wound cleanser prior to applying a clean dressing using gauze sponges, not tissue or cotton balls. [name], [name] ([id number]) [id number] Page 4 of 9 Prim Dressing: Promogran Prisma Matrix, 4.34 (sq in) (silver collagen) Every Other Day/30 Days ary Discharge Instructions: Moisten collagen with saline or hydrogel Secondary Dressing: ABD Pad, 5x9 Every Other Day/30 Days Discharge Instructions: Apply over primary dressing as directed. Secondary Dressing: Woven Gauze Sponge, Non-Sterile 4x4 in Every Other Day/30 Days Discharge Instructions: Apply over primary dressing as directed. Secured With: Elastic Bandage 4 inch (ACE bandage) Every Other Day/30 Days Discharge Instructions: Secure with ACE bandage as directed. Secured With: [employer], 4.5x3.1  (in/yd) Every Other Day/30 Days Discharge Instructions: Secure with Kerlix as directed. Wound #2 - Gluteal fold Wound Laterality: Left Cleanser: Soap and Water 1 x Per Day/30 Days Discharge Instructions: May shower and wash wound with dial antibacterial soap and water prior to dressing change. Cleanser: Wound Cleanser 1 x Per Day/30 Days Discharge Instructions: Cleanse the wound with wound cleanser prior to applying a clean dressing using gauze sponges, not tissue or cotton balls. Prim Dressing: KerraCel Ag Gelling Fiber Dressing, 4x5  in (silver alginate) 1 x Per Day/30 Days ary Discharge Instructions: Apply silver alginate to wound bed as instructed Secondary Dressing: ABD Pad, 5x9 1 x Per Day/30 Days Discharge Instructions: Apply over primary dressing as directed. Secondary Dressing: Woven Gauze Sponge, Non-Sterile 4x4 in 1 x Per Day/30 Days Discharge Instructions: Apply over primary dressing as directed. Patient Medications llergies: adhesive tape A Notifications Medication Indication Start End prior to debridement 07/24/2022 lidocaine DOSE topical 4 % cream - cream topical Electronic Signature(s) Signed: 07/24/2022 4:18:21 PM By: Duanne Guess MD FACS Entered By: Duanne Guess on 07/24/2022 16:13:17 -------------------------------------------------------------------------------- Problem List Details Patient Name: Date of Service: Lori Kennedy Chambers. 07/24/2022 3:30 PM Medical Record Number: [id number] Patient Account Number: [id number] Date of Birth/Sex: Treating RN: June 18, 1976 (46 y.o. Lori Standard Primary Care Provider: Gwinda Passe Other Clinician: Referring Provider: Treating Provider/Extender: Lori Nimrod in Treatment: 8 Active Problems ICD-10 Encounter Code Description Active Date MDM Diagnosis [phone] Non-pressure chronic ulcer of other part of left foot with fat layer exposed 05/24/2022 No Yes L98.412 Non-pressure  chronic ulcer of buttock with fat layer exposed 05/24/2022 No Yes LORRY, FURBER ([id number]) [id number] Page 5 of 9 N18.9 Chronic kidney disease, unspecified 05/24/2022 No Yes K70.10 Alcoholic hepatitis without ascites 05/24/2022 No Yes Inactive Problems Resolved Problems Electronic Signature(s) Signed: 07/24/2022 4:09:23 PM By: Duanne Guess MD FACS Entered By: Duanne Guess on 07/24/2022 16:09:23 -------------------------------------------------------------------------------- Progress Note Details Patient Name: Date of Service: Lori Kennedy Chambers. 07/24/2022 3:30 PM Medical Record Number: [id number] Patient Account Number: [id number] Date of Birth/Sex: Treating RN: 1976-06-03 (46 y.o. F) Primary Care Provider: Gwinda Passe Other Clinician: Referring Provider: Treating Provider/Extender: Lori Nimrod in Treatment: 8 Subjective Chief Complaint Information obtained from Patient Patient seen for complaints of Non-Healing Wounds. History of Present Illness (HPI) ADMISSION 05/24/2022 This is a 46 year old woman with a past medical history significant for hypertension, alcoholic hepatitis, chronic anemia, chronic kidney disease, and peptic ulcer disease. She was admitted to the hospital in the middle of July for hypovolemic shock secondary to dehydration from gastroenteritis. She was found on admission to have an ulcer on her left buttock within the confines of the natal cleft and approaching the anal verge. The etiology was felt to be moisture and friction rather than pressure, as the patient is completely ambulatory. No debridement was performed. She was readmitted at the beginning of August with worsening lower extremity edema and fatigue. The wound was basically unchanged. Wound nursing was consulted and they recommended using silver alginate. She was discharged from the hospital with referral to the wound care center.  This past weekend, she noted a wound on her dorsal left foot that she thinks was secondary to friction from shoes. She is here for evaluation of both these wounds. ABI in clinic today was 1.18. She reports that she has quit drinking but she continues to smoke. On the dorsal surface of her left foot between her fourth and fifth metatarsal heads, there is a circular wound exposing the fat layer. There is some slough and eschar accumulation. It does appear consistent with her history of abrasion. On the medial aspect of her left buttock extending into the natal cleft and towards the anal verge. There is a large ulcer with a very clean surface. There is substantial undermining at the cranial portion of the wound. No purulent drainage or malodor. 06/08/2022: The patient returns today with significant worsening of her dorsal foot wound. Apparently she saw her PCP yesterday  who was concerned for cellulitis but did not prescribe any antibiotics. She was also sent for a DVT scan which was negative. There is heavy slough as well as liquefactive necrosis of the fat layer. There is a bulla between the second and third toes that looks as though it may rupture in the near future. The wound undermines under the skin towards this bulla. No frankly purulent drainage or any odor. Her gluteal wound is very clean but is relatively unchanged in size. She has been managing her wounds on her own and finds it difficult to pack the silver alginate into the undermined portion of the buttock wound. She says that she has been putting peroxide on her foot. 06/19/2022: The patient was not seen as a wound care visit last week because she arrived too late to her appointment. She did have a nursing visit, however. The culture that I took on 14 September grew out a polymicrobial population including Bacteroides fragilis Citrobacter Morganella Escherichia coli Enterococcus faecalis Pseudomonas aeruginosa and many others. I prescribed  Bactrim and Augmentin for this polymicrobial infection based upon the resistance genes detected. Keystone topical compounded antibiotic was also ordered and she has been using that on her foot as well. Unfortunately, the wound on her dorsal foot continues to deteriorate. There is a larger area of undermining and more necrotic tissue, including muscle, on the wound surface. No significant malodor or frank pus. Her gluteal wound, on the other hand, looks quite good. It is smaller and very clean and the undermined portion has contracted substantially. 06/27/2022: Last week after our visit, she went to the hospital where she was admitted with sepsis. She underwent surgical debridement of her dorsal foot wound. Cultures taken intraoperatively were negative and a piece of PuraPly was sutured in place. She is currently taking Augmentin as prescribed on discharge. Her gluteal wound continues to contract. It is more superficial and there is even less undermining. She does have some biofilm accumulation on the surface. The dorsal foot wound is stained yellow from the Xeroform that was applied by podiatry. PuraPly is covering about a third of the wound. The rest of the wound surface is very clean. She did have quite a bit of drainage, but it was not purulent or malodorous. 07/05/2022: The wound on her buttocks continues to contract. It is very clean without any accumulation of slough or other debris. The PuraPly that was on her foot came off with her dressing. There are a couple of sutures remaining around the perimeter of the wound. There is slough buildup but no signs of necrosis. [name], [name] ([id number]) [id number] Page 6 of 9 Granulation tissue is starting to form. 10/19; the patient has 2 wounds in the left buttock in the gluteal fold which was a pressure ulcer from a stay in hospital during the summer. She has a more recent wound on the left dorsal foot which was apparently  complications of infection. Both wound surfaces look healthy. She is using silver alginate on the left foot 07/24/2022: I have not seen her in 2 weeks and both wounds have contracted considerably since then. The dorsal foot wound has come in by a centimeter in width since her last visit. The gluteal wound is quite clean; there is a bit of slough buildup on the dorsal foot wound. Patient History Information obtained from Patient. Family History Cancer - Mother, Diabetes - Mother, Hypertension - Mother,Maternal Grandparents,Paternal Grandparents, No family history of Heart Disease, Hereditary Spherocytosis, Kidney Disease, Lung Disease, Seizures,  Stroke, Thyroid Problems, Tuberculosis. Social History Current every day smoker - .5 pack a day, Marital Status - Single, Alcohol Use - Never, Drug Use - No History, Caffeine Use - Daily. Medical History Hematologic/Lymphatic Patient has history of Anemia, Lymphedema Cardiovascular Patient has history of Hypertension, Hypotension Gastrointestinal Patient has history of Cirrhosis Endocrine Denies history of Type I Diabetes, Type II Diabetes Hospitalization/Surgery History - esophagogastroduodenoscopy. - biopsy. - cholecystectomy. - breast biopsy. - abdominoplasty. - tummy tuck. - hernia repair. - roux-en-y procedure. - tumor removal. - wrist ganglion incision. Medical A Surgical History Notes nd Gastrointestinal slow GI blood loss, alcoholic hepatitis Genitourinary CKD stage III Psychiatric depression Objective Constitutional Tachycardic, asymptomatic.Marland Kitchen No acute distress.. Vitals Time Taken: 3:35 AM, Height: 66 in, Weight: 197 lbs, BMI: 31.8, Temperature: 98.5 F, Pulse: 112 bpm, Respiratory Rate: 20 breaths/min, Blood Pressure: 108/72 mmHg. Respiratory Normal work of breathing on room air.. General Notes: 07/24/2022: I have not seen her in 2 weeks and both wounds have contracted considerably since then. The dorsal foot wound has come in  by a centimeter in width since her last visit. The gluteal wound is quite clean; there is a bit of slough buildup on the dorsal foot wound. Integumentary (Hair, Skin) Wound #1 status is Open. Original cause of wound was Shear/Friction. The date acquired was: 05/20/2022. The wound has been in treatment 8 weeks. The wound is located on the Left,Dorsal Foot. The wound measures 1.8cm length x 3.5cm width x 0.1cm depth; 4.948cm^2 area and 0.495cm^3 volume. There is Fat Layer (Subcutaneous Tissue) exposed. There is no tunneling or undermining noted. There is a medium amount of serosanguineous drainage noted. The wound margin is distinct with the outline attached to the wound base. There is large (67-100%) red, hyper - granulation within the wound bed. There is a small (1- 33%) amount of necrotic tissue within the wound bed including Adherent Slough. The periwound skin appearance had no abnormalities noted for texture. The periwound skin appearance had no abnormalities noted for moisture. The periwound skin appearance had no abnormalities noted for color. Periwound temperature was noted as No Abnormality. The periwound has tenderness on palpation. Wound #2 status is Open. Original cause of wound was Shear/Friction. The date acquired was: 03/20/2022. The wound has been in treatment 8 weeks. The wound is located on the Left Gluteal fold. The wound measures 4.5cm length x 4.5cm width x 0.2cm depth; 15.904cm^2 area and 3.181cm^3 volume. There is Fat Layer (Subcutaneous Tissue) exposed. There is no tunneling or undermining noted. There is a medium amount of serosanguineous drainage noted. The wound margin is distinct with the outline attached to the wound base. There is large (67-100%) red, pink granulation within the wound bed. There is no necrotic tissue within the wound bed. The periwound skin appearance had no abnormalities noted for texture. The periwound skin appearance had no abnormalities noted for  moisture. The periwound skin appearance had no abnormalities noted for color. Periwound temperature was noted as No Abnormality. The periwound has tenderness on palpation. 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 There was a Selective/Open Wound Non-Viable Tissue Debridement with a total area of 6.3 sq cm performed by Duanne Guess, MD. With the  following instrument(s): Curette to remove Non-Viable tissue/material. Material removed includes Slough after achieving pain control using Lidocaine 4% Topical Solution. No specimens were taken. A time out was conducted at 16:05, prior to the start of the procedure. A Minimum amount of bleeding was controlled with Pressure. The procedure was tolerated well with a pain level of 3 throughout and a pain level of 2 following the procedure. Post Debridement Measurements: 1.8cm length x 3.5cm width x 0.1cm depth; 0.495cm^3 volume. Character of Wound/Ulcer Post Debridement is improved. Post procedure Diagnosis Wound #1: Same as Pre-Procedure General Notes: scribed for Dr. Lady Gary by Lori Deed, RN. Plan Follow-up Appointments: Return Appointment in 1 week. - Dr. Lady Gary - Rm 4 Bathing/ Shower/ Hygiene: May shower with protection but do not get wound dressing(s) wet. Edema Control - Lymphedema / SCD / Other: Elevate legs to the level of the heart or above for 30 minutes daily and/or when sitting, a frequency of: Avoid standing for long periods of time. The following medication(s) was prescribed: lidocaine topical 4 % cream cream topical for prior to debridement  was prescribed at facility WOUND #1: - Foot Wound Laterality: Dorsal, Left Cleanser: Soap and Water Every Other Day/30 Days Discharge Instructions: May shower and wash wound with dial antibacterial soap and water prior to dressing change. Cleanser: Wound Cleanser Every Other Day/30 Days Discharge Instructions: Cleanse the wound with wound cleanser prior to applying a clean dressing using gauze sponges, not tissue or cotton balls. Prim Dressing: Promogran Prisma Matrix, 4.34 (sq in) (silver collagen) Every Other Day/30 Days ary Discharge Instructions: Moisten collagen with saline or hydrogel Secondary Dressing: ABD Pad, 5x9 Every Other Day/30 Days Discharge Instructions: Apply over primary dressing as directed. Secondary Dressing: Woven Gauze Sponge, Non-Sterile 4x4 in Every Other Day/30 Days Discharge Instructions: Apply over primary dressing as directed. Secured With: Elastic Bandage 4 inch (ACE bandage) Every Other Day/30 Days Discharge Instructions: Secure with ACE bandage as directed. Secured With: [employer], 4.5x3.1 (in/yd) Every Other Day/30 Days Discharge Instructions: Secure with Kerlix as directed. WOUND #2: - Gluteal fold Wound Laterality: Left Cleanser: Soap and Water 1 x Per Day/30 Days Discharge Instructions: May shower and wash wound with dial antibacterial soap and water prior to dressing change. Cleanser: Wound Cleanser 1 x Per Day/30 Days Discharge Instructions: Cleanse the wound with wound cleanser prior to applying a clean dressing using gauze sponges, not tissue or cotton balls. Prim Dressing: KerraCel Ag Gelling Fiber Dressing, 4x5 in (silver alginate) 1 x Per Day/30 Days ary Discharge Instructions: Apply silver alginate to wound bed as instructed Secondary Dressing: ABD Pad, 5x9 1 x Per Day/30 Days Discharge Instructions: Apply over primary dressing as directed. Secondary Dressing: Woven Gauze Sponge, Non-Sterile 4x4 in 1 x Per Day/30 Days Discharge  Instructions: Apply over primary dressing as directed. 07/24/2022: I have not seen her in 2 weeks and both wounds have contracted considerably since then. The dorsal foot wound has come in by a centimeter in width since her last visit. The gluteal wound is quite clean; there is a bit of slough buildup on the dorsal foot wound. I used a curette to debride slough off of the dorsal foot wound. The gluteal wound did not require debridement. We will continue silver collagen to the foot and silver alginate to the gluteus. Follow-up in 1 week. Electronic Signature(s) Signed: 07/24/2022 4:13:46 PM By: Duanne Guess MD FACS Entered By: Duanne Guess on 07/24/2022 16:13:45 Lori Balsam ([id number]) [id number] Page 8 of 9 -------------------------------------------------------------------------------- HxROS Details  Patient Name: Date of Service: [name], [name] 07/24/2022 3:30 PM Medical Record Number: [id number] Patient Account Number: [id number] Date of Birth/Sex: Treating RN: 03/14/1976 (46 y.o. F) Primary Care Provider: Gwinda Passe Other Clinician: Referring Provider: Treating Provider/Extender: Lori Nimrod in Treatment: 8 Information Obtained From Patient Hematologic/Lymphatic Medical History: Positive for: Anemia; Lymphedema Cardiovascular Medical History: Positive for: Hypertension; Hypotension Gastrointestinal Medical History: Positive for: Cirrhosis Past Medical History Notes: slow GI blood loss, alcoholic hepatitis Endocrine Medical History: Negative for: Type I Diabetes; Type II Diabetes Genitourinary Medical History: Past Medical History Notes: CKD stage III Psychiatric Medical History: Past Medical History Notes: depression Immunizations Pneumococcal Vaccine: Received Pneumococcal Vaccination: No Implantable Devices None Hospitalization / Surgery History Type of  Hospitalization/Surgery esophagogastroduodenoscopy biopsy cholecystectomy breast biopsy abdominoplasty tummy tuck hernia repair roux-en-y procedure tumor removal wrist ganglion incision Family and Social History Cancer: Yes - Mother; Diabetes: Yes - Mother; Heart Disease: No; Hereditary Spherocytosis: No; Hypertension: Yes - Mother,Maternal Grandparents,Paternal Grandparents; Kidney Disease: No; Lung Disease: No; Seizures: No; Stroke: No; Thyroid Problems: No; Tuberculosis: No; Current every day smoker - .5 pack a day; Marital Status - Single; Alcohol Use: Never; Drug Use: No History; Caffeine Use: Daily; Financial Concerns: Yes; Food, Clothing or [address] FAATIMA, TENCH ([id number]) [id number] Page 9 of 9 Needs: No; Support System Lacking: No; Transportation Concerns: No Electronic Signature(s) Signed: 07/24/2022 4:18:21 PM By: Duanne Guess MD FACS Entered By: Duanne Guess on 07/24/2022 16:12:20 -------------------------------------------------------------------------------- SuperBill Details Patient Name: Date of Service: [name] 07/24/2022 Medical Record Number: [id number] Patient Account Number: [id number] Date of Birth/Sex: Treating RN: 1976-05-31 (46 y.o. 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 N18.9 Chronic kidney disease, unspecified K70.10 Alcoholic hepatitis without ascites Facility Procedures : CPT4 Code: [id number] Description: [phone] - DEBRIDE WOUND 1ST 20 SQ CM OR < ICD-10 Diagnosis Description L97.522 Non-pressure chronic ulcer of other part of left foot with fat layer  exposed Modifier: Quantity: 1 Physician Procedures : CPT4 Code Description Modifier [id number] - WC PHYS LEVEL 3 - EST PT 25 ICD-10 Diagnosis Description L97.522 Non-pressure chronic ulcer of other part of left foot with fat layer exposed L98.412 Non-pressure chronic ulcer of buttock with fat layer  exposed N18.9 Chronic kidney disease, unspecified K70.10 Alcoholic hepatitis without ascites Quantity: 1 : [id number] - WC PHYS DEBR WO ANESTH 20 SQ CM ICD-10 Diagnosis Description L97.522 Non-pressure chronic ulcer of other part of left foot with fat layer exposed Quantity: 1 Electronic Signature(s) Signed: 07/24/2022 4:16:36 PM By: Duanne Guess MD FACS Entered By: Duanne Guess on 07/24/2022 16:16:36

## 2022-07-25 NOTE — Progress Notes (Signed)
LOU, IRIGOYEN ([id number]) [id number] Page 1 of 9 Visit Report for 07/24/2022 Arrival Information Details Patient Name: Date of Service: [name], [name] 07/24/2022 3:30 PM Medical Record Number: [id number] Patient Account Number: [id number] Date of Birth/Sex: Treating RN: 1976-03-30 (46 y.o. F) Primary Care Symphony Demuro: Juluis Mire Other Clinician: Referring Tzippy Testerman: Treating Treniyah Lynn/Extender: Patrici Ranks in Treatment: 8 Visit Information History Since Last Visit All ordered tests and consults were completed: No Patient Arrived: Ambulatory Added or deleted any medications: No Arrival Time: 15:40 Any new allergies or adverse reactions: No Transfer Assistance: None Had a fall or experienced change in No Patient Identification Verified: Yes activities of daily living that may affect Secondary Verification Process Completed: Yes risk of falls: Patient Requires Transmission-Based Precautions: No Signs or symptoms of abuse/neglect since last visito No Patient Has Alerts: No Hospitalized since last visit: No Implantable device outside of the clinic excluding No cellular tissue based products placed in the center since last visit: Pain Present Now: No Electronic Signature(s) Signed: 07/24/2022 4:20:09 PM By: Worthy Rancher Entered By: Worthy Rancher on 07/24/2022 15:40:31 -------------------------------------------------------------------------------- Encounter Discharge Information Details Patient Name: Date of Service: Lori Hawking Chambers. 07/24/2022 3:30 PM Medical Record Number: [id number] Patient Account Number: [id number] Date of Birth/Sex: Treating RN: 23-Feb-1976 (46 y.o. Elam Dutch Primary Care Teresha Hanks: Juluis Mire Other Clinician: Referring Imanni Burdine: Treating Laina Guerrieri/Extender: Patrici Ranks in Treatment: 8 Encounter Discharge Information Items Post Procedure  Vitals Discharge Condition: Stable Temperature (F): 98.5 Ambulatory Status: Ambulatory Pulse (bpm): 112 Discharge Destination: Home Respiratory Rate (breaths/min): 18 Transportation: Private Auto Blood Pressure (mmHg): 108/72 Accompanied By: self Schedule Follow-up Appointment: Yes Clinical Summary of Care: Patient Declined Electronic Signature(s) Signed: 07/24/2022 5:08:53 PM By: Baruch Gouty RN, BSN Entered By: Baruch Gouty on 07/24/2022 16:24:11 [name] ([id number]) [id number] Page 2 of 9 -------------------------------------------------------------------------------- Lower Extremity Assessment Details Patient Name: Date of Service: [name], [name] 07/24/2022 3:30 PM Medical Record Number: [id number] Patient Account Number: [id number] Date of Birth/Sex: Treating RN: 1975/11/19 (46 y.o. Elam Dutch Primary Care Stacy Deshler: Juluis Mire Other Clinician: Referring Abagael Kramm: Treating Lylianna Fraiser/Extender: Patrici Ranks in Treatment: 8 Edema Assessment Assessed: [Left: No] [Right: No] Edema: [Left: Ye] [Right: s] Calf Left: Right: Point of Measurement: From Medial Instep 34 cm Ankle Left: Right: Point of Measurement: From Medial Instep 23.5 cm Vascular Assessment Pulses: Dorsalis Pedis Palpable: [Left:Yes] Electronic Signature(s) Signed: 07/24/2022 5:08:53 PM By: Baruch Gouty RN, BSN Entered By: Baruch Gouty on 07/24/2022 15:50:04 -------------------------------------------------------------------------------- Multi Wound Chart Details Patient Name: Date of Service: Lori Hawking Chambers. 07/24/2022 3:30 PM Medical Record Number: [id number] Patient Account Number: [id number] Date of Birth/Sex: Treating RN: 1976-02-07 (47 y.o. F) Primary Care Manoj Enriquez: Juluis Mire Other Clinician: Referring Guage Efferson: Treating Nazir Hacker/Extender: Patrici Ranks in  Treatment: 8 Vital Signs Height(in): 66 Pulse(bpm): 112 Weight(lbs): 197 Blood Pressure(mmHg): 108/72 Body Mass Index(BMI): 31.8 Temperature(F): 98.5 Respiratory Rate(breaths/min): 20 [1:Photos:] [N/A:N/A [id number] Page 3 of 9] Left, Dorsal Foot Left Gluteal fold N/A Wound Location: Shear/Friction Shear/Friction N/A Wounding Event: Trauma, Other Trauma, Other N/A Primary Etiology: Anemia, Lymphedema, Hypertension, Anemia, Lymphedema, Hypertension, N/A Comorbid History: Hypotension, Cirrhosis Hypotension, Cirrhosis 05/20/2022 03/20/2022 N/A Date Acquired: 8 8 N/A Weeks of Treatment: Open Open N/A Wound Status: No No N/A Wound Recurrence: 1.8x3.5x0.1 4.5x4.5x0.2 N/A Measurements L x W x D (cm) 4.948 15.904 N/A A (cm) : rea 0.495 3.181 N/A Volume (cm) : -599.90% 37.10% N/A % Reduction in A rea: -597.20%  90.30% N/A % Reduction in Volume: Full Thickness Without Exposed Full Thickness Without Exposed N/A Classification: Support Structures Support Structures Medium Medium N/A Exudate A mount: Serosanguineous Serosanguineous N/A Exudate Type: red, brown red, brown N/A Exudate Color: Distinct, outline attached Distinct, outline attached N/A Wound Margin: Large (67-100%) Large (67-100%) N/A Granulation A mount: Red, Hyper-granulation Red, Pink N/A Granulation Quality: Small (1-33%) None Present (0%) N/A Necrotic A mount: Fat Layer (Subcutaneous Tissue): Yes Fat Layer (Subcutaneous Tissue): Yes N/A Exposed Structures: Fascia: No Fascia: No Tendon: No Tendon: No Muscle: No Muscle: No Joint: No Joint: No Bone: No Bone: No Small (1-33%) Small (1-33%) N/A Epithelialization: Debridement - Selective/Open Wound N/A N/A Debridement: Pre-procedure Verification/Time Out 16:05 N/A N/A Taken: Lidocaine 4% Topical Solution N/A N/A Pain Control: Slough N/A N/A Tissue Debrided: Non-Viable Tissue N/A N/A Level: 6.3 N/A N/A Debridement  A (sq cm): rea Curette N/A N/A Instrument: Minimum N/A N/A Bleeding: Pressure N/A N/A Hemostasis A chieved: 3 N/A N/A Procedural Pain: 2 N/A N/A Post Procedural Pain: Procedure was tolerated well N/A N/A Debridement Treatment Response: 1.8x3.5x0.1 N/A N/A Post Debridement Measurements L x W x D (cm) 0.495 N/A N/A Post Debridement Volume: (cm) No Abnormalities Noted No Abnormalities Noted N/A Periwound Skin Texture: No Abnormalities Noted No Abnormalities Noted N/A Periwound Skin Moisture: No Abnormalities Noted No Abnormalities Noted N/A Periwound Skin Color: No Abnormality No Abnormality N/A Temperature: Yes Yes N/A Tenderness on Palpation: Debridement N/A N/A Procedures Performed: Treatment Notes Electronic Signature(s) Signed: 07/24/2022 4:09:31 PM By: Fredirick Maudlin MD FACS Entered By: Fredirick Maudlin on 07/24/2022 16:09:31 -------------------------------------------------------------------------------- Multi-Disciplinary Care Plan Details Patient Name: Date of Service: Lori Hawking Chambers. 07/24/2022 3:30 PM Medical Record Number: [id number] Patient Account Number: [id number] Date of Birth/Sex: Treating RN: 17-Sep-1976 (46 y.o. Elam Dutch Primary Care Hasel Janish: Juluis Mire Other Clinician: Referring Faizon Capozzi: Treating Amarilys Lyles/Extender: Patrici Ranks in Treatment: 8 Multidisciplinary Care Plan reviewed with physician [address] [place], [place] ([id number]) [id number] Page 4 of 9 Pressure Nursing Diagnoses: Knowledge deficit related to causes and risk factors for pressure ulcer development Potential for impaired tissue integrity related to pressure, friction, moisture, and shear Goals: Patient will remain free from development of additional pressure ulcers Date Initiated: 05/24/2022 Date Inactivated: 06/27/2022 Target Resolution Date: 06/30/2022 Goal Status: Met Patient/caregiver  will verbalize risk factors for pressure ulcer development Date Initiated: 05/24/2022 Date Inactivated: 06/27/2022 Target Resolution Date: 06/30/2022 Goal Status: Met Patient/caregiver will verbalize understanding of pressure ulcer management Date Initiated: 06/27/2022 Target Resolution Date: 08/22/2022 Goal Status: Active Interventions: Assess: immobility, friction, shearing, incontinence upon admission and as needed Assess potential for pressure ulcer upon admission and as needed Notes: Wound/Skin Impairment Nursing Diagnoses: Impaired tissue integrity Knowledge deficit related to ulceration/compromised skin integrity Goals: Patient will demonstrate a reduced rate of smoking or cessation of smoking Date Initiated: 05/24/2022 Target Resolution Date: 08/22/2022 Goal Status: Active Patient/caregiver will verbalize understanding of skin care regimen Date Initiated: 05/24/2022 Target Resolution Date: 08/22/2022 Goal Status: Active Ulcer/skin breakdown will have a volume reduction of 50% by week 8 Date Initiated: 06/27/2022 Date Inactivated: 07/24/2022 Target Resolution Date: 07/25/2022 Goal Status: Unmet Unmet Reason: infection in foot Ulcer/skin breakdown will have a volume reduction of 80% by week 12 Date Initiated: 07/24/2022 Target Resolution Date: 08/22/2022 Goal Status: Active Interventions: Assess patient/caregiver ability to obtain necessary supplies Assess ulceration(s) every visit Treatment Activities: Skin care regimen initiated : 05/24/2022 Topical wound management initiated : 05/24/2022 Notes: Electronic Signature(s) Signed: 07/24/2022 5:08:53 PM By: Baruch Gouty  RN, BSN Entered By: Baruch Gouty on 07/24/2022 16:02:35 -------------------------------------------------------------------------------- Pain Assessment Details Patient Name: Date of Service: [name], [name] 07/24/2022 3:30 PM Medical Record Number: [id number] Patient Account Number:  [id number] Date of Birth/Sex: Treating RN: 08-29-1976 (46 y.o. F) Primary Care Gianfranco Araki: Juluis Mire Other Clinician: Referring Tonio Seider: Treating Makoa Satz/Extender: Patrici Ranks in Treatment: [address] [place] ([id number]) [id number] Page 5 of 9 Active Problems Location of Pain Severity and Description of Pain Patient Has Paino No Site Locations Rate the pain. Current Pain Level: 0 Character of Pain Describe the Pain: Tender Pain Management and Medication Current Pain Management: Electronic Signature(s) Signed: 07/24/2022 5:08:53 PM By: Baruch Gouty RN, BSN Entered By: Baruch Gouty on 07/24/2022 15:49:53 -------------------------------------------------------------------------------- Patient/Caregiver Education Details Patient Name: Date of Service: [name] 10/30/2023andnbsp3:30 PM Medical Record Number: [id number] Patient Account Number: [id number] Date of Birth/Gender: Treating RN: [birth year] (46 y.o. Elam Dutch Primary Care Physician: Juluis Mire Other Clinician: Referring Physician: Treating Physician/Extender: Patrici Ranks in Treatment: 8 Education Assessment Education Provided To: Patient Education Topics Provided Pressure: Methods: Explain/Verbal Responses: Reinforcements needed, State content correctly Wound/Skin Impairment: Methods: Explain/Verbal Responses: Reinforcements needed, State content correctly Electronic Signature(s) Signed: 07/24/2022 5:08:53 PM By: Baruch Gouty RN, BSN Entered By: Baruch Gouty on 07/24/2022 16:02:59 [name] ([id number]) [id number] Page 6 of 9 -------------------------------------------------------------------------------- Wound Assessment Details Patient Name: Date of Service: [name], [name] 07/24/2022 3:30 PM Medical Record Number: [id number] Patient Account  Number: [id number] Date of Birth/Sex: Treating RN: [birth year] (46 y.o. Elam Dutch Primary Care Zayn Selley: Juluis Mire Other Clinician: Referring Shaelyn Decarli: Treating Steaven Wholey/Extender: Patrici Ranks in Treatment: 8 Wound Status Wound Number: 1 Primary Etiology: Trauma, Other Wound Location: Left, Dorsal Foot Wound Status: Open Wounding Event: Shear/Friction Comorbid Anemia, Lymphedema, Hypertension, Hypotension, History: Cirrhosis Date Acquired: 05/20/2022 Weeks Of Treatment: 8 Clustered Wound: No Photos Wound Measurements Length: (cm) 1.8 Width: (cm) 3.5 Depth: (cm) 0.1 Area: (cm) 4.948 Volume: (cm) 0.495 % Reduction in Area: -599.9% % Reduction in Volume: -597.2% Epithelialization: Small (1-33%) Tunneling: No Undermining: No Wound Description Classification: Full Thickness Without Exposed Suppor Wound Margin: Distinct, outline attached Exudate Amount: Medium Exudate Type: Serosanguineous Exudate Color: red, brown t Structures Foul Odor After Cleansing: No Slough/Fibrino Yes Wound Bed Granulation Amount: Large (67-100%) Exposed Structure Granulation Quality: Red, Hyper-granulation Fascia Exposed: No Necrotic Amount: Small (1-33%) Fat Layer (Subcutaneous Tissue) Exposed: Yes Necrotic Quality: Adherent Slough Tendon Exposed: No Muscle Exposed: No Joint Exposed: No Bone Exposed: No Periwound Skin Texture Texture Color No Abnormalities Noted: Yes No Abnormalities Noted: Yes Moisture Temperature / Pain No Abnormalities Noted: Yes Temperature: No Abnormality Tenderness on Palpation: Yes Treatment Notes Wound #1 (Foot) Wound Laterality: Dorsal, Left Cleanser Soap and Water [name], [name] ([id number]) [id number] Page 7 of 9 Discharge Instruction: May shower and wash wound with dial antibacterial soap and water prior to dressing change. Wound Cleanser Discharge Instruction: Cleanse the wound  with wound cleanser prior to applying a clean dressing using gauze sponges, not tissue or cotton balls. Peri-Wound Care Topical Primary Dressing Promogran Prisma Matrix, 4.34 (sq in) (silver collagen) Discharge Instruction: Moisten collagen with saline or hydrogel Secondary Dressing ABD Pad, 5x9 Discharge Instruction: Apply over primary dressing as directed. Woven Gauze Sponge, Non-Sterile 4x4 in Discharge Instruction: Apply over primary dressing as directed. Secured With Elastic Bandage 4 inch (ACE bandage) Discharge Instruction: Secure with ACE bandage as directed. Kerlix Roll Sterile, 4.5x3.1 (in/yd) Discharge Instruction: Secure with Kerlix  as directed. Compression Wrap Compression Stockings Add-Ons Electronic Signature(s) Signed: 07/24/2022 4:20:09 PM By: Worthy Rancher Signed: 07/24/2022 5:08:53 PM By: Baruch Gouty RN, BSN Entered By: Worthy Rancher on 07/24/2022 15:54:45 -------------------------------------------------------------------------------- Wound Assessment Details Patient Name: Date of Service: [name]. 07/24/2022 3:30 PM Medical Record Number: [id number] Patient Account Number: [id number] Date of Birth/Sex: Treating RN: 07-27-1976 (46 y.o. F) Primary Care Renesmay Nesbitt: Juluis Mire Other Clinician: Referring Ricka Westra: Treating Carrissa Taitano/Extender: Patrici Ranks in Treatment: 8 Wound Status Wound Number: 2 Primary Etiology: Trauma, Other Wound Location: Left Gluteal fold Wound Status: Open Wounding Event: Shear/Friction Comorbid Anemia, Lymphedema, Hypertension, Hypotension, History: Cirrhosis Date Acquired: 03/20/2022 Weeks Of Treatment: 8 Clustered Wound: No Photos Wound Measurements [name], [name] ([id number]) Length: (cm) 4.5 Width: (cm) 4.5 Depth: (cm) 0.2 Area: (cm) 15.904 Volume: (cm) 3.181 [id number] Page 8 of 9 % Reduction in Area: 37.1% % Reduction in Volume:  90.3% Epithelialization: Small (1-33%) Tunneling: No Undermining: No Wound Description Classification: Full Thickness Without Exposed Support Structures Wound Margin: Distinct, outline attached Exudate Amount: Medium Exudate Type: Serosanguineous Exudate Color: red, brown Foul Odor After Cleansing: No Slough/Fibrino No Wound Bed Granulation Amount: Large (67-100%) Exposed Structure Granulation Quality: Red, Pink Fascia Exposed: No Necrotic Amount: None Present (0%) Fat Layer (Subcutaneous Tissue) Exposed: Yes Tendon Exposed: No Muscle Exposed: No Joint Exposed: No Bone Exposed: No Periwound Skin Texture Texture Color No Abnormalities Noted: Yes No Abnormalities Noted: Yes Moisture Temperature / Pain No Abnormalities Noted: Yes Temperature: No Abnormality Tenderness on Palpation: Yes Treatment Notes Wound #2 (Gluteal fold) Wound Laterality: Left Cleanser Soap and Water Discharge Instruction: May shower and wash wound with dial antibacterial soap and water prior to dressing change. Wound Cleanser Discharge Instruction: Cleanse the wound with wound cleanser prior to applying a clean dressing using gauze sponges, not tissue or cotton balls. Peri-Wound Care Topical Primary Dressing KerraCel Ag Gelling Fiber Dressing, 4x5 in (silver alginate) Discharge Instruction: Apply silver alginate to wound bed as instructed Secondary Dressing ABD Pad, 5x9 Discharge Instruction: Apply over primary dressing as directed. Woven Gauze Sponge, Non-Sterile 4x4 in Discharge Instruction: Apply over primary dressing as directed. Secured With Compression Wrap Compression Stockings Environmental education officer) Signed: 07/24/2022 5:08:53 PM By: Baruch Gouty RN, BSN Entered By: Baruch Gouty on 07/24/2022 [place], [place] ([id number]) [id number] Page 9 of 9 -------------------------------------------------------------------------------- Vitals  Details Patient Name: Date of Service: [name], [name] 07/24/2022 3:30 PM Medical Record Number: [id number] Patient Account Number: [id number] Date of Birth/Sex: Treating RN: [birth year] (46 y.o. F) Primary Care Zahlia Deshazer: Juluis Mire Other Clinician: Referring Ansley Mangiapane: Treating Junnie Loschiavo/Extender: Patrici Ranks in Treatment: 8 Vital Signs Time Taken: 03:35 Temperature (F): 98.5 Height (in): 66 Pulse (bpm): 112 Weight (lbs): 197 Respiratory Rate (breaths/min): 20 Body Mass Index (BMI): 31.8 Blood Pressure (mmHg): 108/72 Reference Range: 80 - 120 mg / dl Electronic Signature(s) Signed: 07/24/2022 4:20:09 PM By: Worthy Rancher Entered By: Worthy Rancher on 07/24/2022 15:40:57

## 2022-07-26 LAB — CELIAC PANEL 10
Antigliadin Abs, IgA: 8 units (ref 0–19)
Endomysial IgA: NEGATIVE
Gliadin IgG: 2 units (ref 0–19)
IgA/Immunoglobulin A, Serum: 996 mg/dL — ABNORMAL HIGH (ref 87–352)
Tissue Transglut Ab: 3 U/mL (ref 0–5)
Transglutaminase IgA: <2 U/mL (ref 0–3)

## 2022-07-26 NOTE — Telephone Encounter (Signed)
.  A user error has taken place: error

## 2022-07-26 NOTE — Telephone Encounter (Signed)
Patient scheduled for 10-02-2022 at 2:15 now

## 2022-07-26 NOTE — Telephone Encounter (Signed)
Spoke to [name] from [employer]. She said that they reached out to the patient 07-24-2022 and she said she will call to set up an appointment with them when she gets new insurance since she is in the process of getting new insurance

## 2022-07-29 LAB — HEPATITIS A ANTIBODY, TOTAL: Hepatitis A AB,Total: REACTIVE — AB

## 2022-07-29 LAB — MITOCHONDRIAL ANTIBODIES: Mitochondrial M2 Ab, IgG: <=20 U (ref <=20.0)

## 2022-07-29 LAB — ANTI-SMOOTH MUSCLE ANTIBODY, IGG: Actin (Smooth Muscle) Antibody (IGG): 28 U — ABNORMAL HIGH (ref <20)

## 2022-07-29 LAB — AFP TUMOR MARKER: AFP-Tumor Marker: 9 ng/mL — ABNORMAL HIGH

## 2022-07-29 LAB — CERULOPLASMIN: Ceruloplasmin: 19 mg/dL (ref 18–53)

## 2022-07-29 LAB — HEPATITIS B SURFACE ANTIBODY,QUALITATIVE: Hep B S Ab: REACTIVE — AB

## 2022-07-29 LAB — ALPHA-1-ANTITRYPSIN: A-1 Antitrypsin, Ser: 149 mg/dL (ref 83–199)

## 2022-07-31 ENCOUNTER — Encounter (HOSPITAL_BASED_OUTPATIENT_CLINIC_OR_DEPARTMENT_OTHER): Payer: Self-pay | Attending: General Surgery | Admitting: General Surgery

## 2022-07-31 DIAGNOSIS — L97522 Non-pressure chronic ulcer of other part of left foot with fat layer exposed: Secondary | ICD-10-CM | POA: Insufficient documentation

## 2022-07-31 DIAGNOSIS — I890 Lymphedema, not elsewhere classified: Secondary | ICD-10-CM | POA: Insufficient documentation

## 2022-07-31 DIAGNOSIS — L732 Hidradenitis suppurativa: Secondary | ICD-10-CM | POA: Insufficient documentation

## 2022-07-31 DIAGNOSIS — N183 Chronic kidney disease, stage 3 unspecified: Secondary | ICD-10-CM | POA: Insufficient documentation

## 2022-07-31 DIAGNOSIS — K7010 Alcoholic hepatitis without ascites: Secondary | ICD-10-CM | POA: Insufficient documentation

## 2022-07-31 DIAGNOSIS — L98412 Non-pressure chronic ulcer of buttock with fat layer exposed: Secondary | ICD-10-CM | POA: Insufficient documentation

## 2022-07-31 DIAGNOSIS — I129 Hypertensive chronic kidney disease with stage 1 through stage 4 chronic kidney disease, or unspecified chronic kidney disease: Secondary | ICD-10-CM | POA: Insufficient documentation

## 2022-07-31 DIAGNOSIS — D631 Anemia in chronic kidney disease: Secondary | ICD-10-CM | POA: Insufficient documentation

## 2022-07-31 DIAGNOSIS — K7460 Unspecified cirrhosis of liver: Secondary | ICD-10-CM | POA: Insufficient documentation

## 2022-07-31 NOTE — Progress Notes (Signed)
[name], [name] ([id number]) [id number] Page 1 of 9 Visit Report for 07/31/2022 Arrival Information Details Patient Name: Date of Service: [name], [name] 07/31/2022 3:30 PM Medical Record Number: [id number] Patient Account Number: [id number] Date of Birth/Sex: Treating RN: [birth year] (46 y.o. Martyn Malay, Linda Primary Care Charlei Ramsaran: Juluis Mire Other Clinician: Referring Chianna Spirito: Treating Roux Brandy/Extender: Patrici Ranks in Treatment: 9 Visit Information History Since Last Visit Added or deleted any medications: No Patient Arrived: Ambulatory Any new allergies or adverse reactions: No Arrival Time: 15:48 Had a fall or experienced change in No Accompanied By: self activities of daily living that may affect Transfer Assistance: None risk of falls: Patient Identification Verified: Yes Signs or symptoms of abuse/neglect since last visito No Secondary Verification Process Completed: Yes Hospitalized since last visit: No Patient Requires Transmission-Based Precautions: No Implantable device outside of the clinic excluding No Patient Has Alerts: No cellular tissue based products placed in the center since last visit: Has Dressing in Place as Prescribed: Yes Pain Present Now: Yes Electronic Signature(s) Signed: 07/31/2022 4:54:31 PM By: Baruch Gouty RN, BSN Entered By: Baruch Gouty on 07/31/2022 15:49:24 -------------------------------------------------------------------------------- Encounter Discharge Information Details Patient Name: Date of Service: Lori Hawking Chambers. 07/31/2022 3:30 PM Medical Record Number: [id number] Patient Account Number: [id number] Date of Birth/Sex: Treating RN: [birth year] (46 y.o. Elam Dutch Primary Care Donata Reddick: Juluis Mire Other Clinician: Referring Cynthis Purington: Treating Miciah Shealy/Extender: Patrici Ranks in Treatment: 9 Encounter Discharge  Information Items Post Procedure Vitals Discharge Condition: Stable Temperature (F): 98.5 Ambulatory Status: Ambulatory Pulse (bpm): 112 Discharge Destination: Home Respiratory Rate (breaths/min): 18 Transportation: Private Auto Blood Pressure (mmHg): 120/78 Accompanied By: self Schedule Follow-up Appointment: Yes Clinical Summary of Care: Patient Declined Electronic Signature(s) Signed: 07/31/2022 4:54:31 PM By: Baruch Gouty RN, BSN Entered By: Baruch Gouty on 07/31/2022 16:31:16 [name] ([id number]) [id number] Page 2 of 9 -------------------------------------------------------------------------------- Lower Extremity Assessment Details Patient Name: Date of Service: [name], [name] 07/31/2022 3:30 PM Medical Record Number: [id number] Patient Account Number: [id number] Date of Birth/Sex: Treating RN: [birth year] (46 y.o. Elam Dutch Primary Care Rafaela Dinius: Juluis Mire Other Clinician: Referring Dhanush Jokerst: Treating Stoney Karczewski/Extender: Patrici Ranks in Treatment: 9 Edema Assessment Assessed: [Left: No] [Right: No] Edema: [Left: Ye] [Right: s] Calf Left: Right: Point of Measurement: From Medial Instep 44 cm Ankle Left: Right: Point of Measurement: From Medial Instep 6 cm Vascular Assessment Pulses: Dorsalis Pedis Palpable: [Left:Yes] Electronic Signature(s) Signed: 07/31/2022 4:54:31 PM By: Baruch Gouty RN, BSN Entered By: Baruch Gouty on 07/31/2022 15:55:26 -------------------------------------------------------------------------------- Multi Wound Chart Details Patient Name: Date of Service: Lori Hawking Chambers. 07/31/2022 3:30 PM Medical Record Number: [id number] Patient Account Number: [id number] Date of Birth/Sex: Treating RN: [birth year] (46 y.o. Elam Dutch Primary Care Vladislav Axelson: Juluis Mire Other Clinician: Referring Jeanette Rauth: Treating Oracio Galen/Extender: Patrici Ranks in Treatment: 9 Vital Signs Height(in): 66 Pulse(bpm): 112 Weight(lbs): 197 Blood Pressure(mmHg): 120/78 Body Mass Index(BMI): 31.8 Temperature(F): 98.5 Respiratory Rate(breaths/min): 18 [1:Photos:] [N/A:N/A [id number] Page 3 of 9] Left, Dorsal Foot Left Gluteal fold N/A Wound Location: Shear/Friction Shear/Friction N/A Wounding Event: Trauma, Other Trauma, Other N/A Primary Etiology: Anemia, Lymphedema, Hypertension, Anemia, Lymphedema, Hypertension, N/A Comorbid History: Hypotension, Cirrhosis Hypotension, Cirrhosis 05/20/2022 03/20/2022 N/A Date Acquired: 9 9 N/A Weeks of Treatment: Open Open N/A Wound Status: No No N/A Wound Recurrence: 1.4x2.3x0.1 4.5x4.3x0.2 N/A Measurements L x W x D (cm) 2.529 15.197 N/A A (cm) : rea 0.253 3.039 N/A Volume (cm) : -257.70%  39.90% N/A % Reduction in A rea: -256.30% 90.80% N/A % Reduction in Volume: 11 Starting Position 1 (o'clock): 1 Ending Position 1 (o'clock): 0.7 Maximum Distance 1 (cm): No Yes N/A Undermining: Full Thickness Without Exposed Full Thickness Without Exposed N/A Classification: Support Structures Support Structures Medium Medium N/A Exudate A mount: Serosanguineous Serosanguineous N/A Exudate Type: red, brown red, brown N/A Exudate Color: Distinct, outline attached Distinct, outline attached N/A Wound Margin: Large (67-100%) Large (67-100%) N/A Granulation A mount: Red Red, Pink N/A Granulation Quality: None Present (0%) None Present (0%) N/A Necrotic A mount: Fat Layer (Subcutaneous Tissue): Yes Fat Layer (Subcutaneous Tissue): Yes N/A Exposed Structures: Fascia: No Fascia: No Tendon: No Tendon: No Muscle: No Muscle: No Joint: No Joint: No Bone: No Bone: No Small (1-33%) Small (1-33%) N/A Epithelialization: Debridement - Selective/Open Wound Debridement - Selective/Open Wound N/A Debridement: Pre-procedure  Verification/Time Out 16:10 16:10 N/A Taken: Lidocaine 4% Topical Solution Lidocaine 4% Topical Solution N/A Pain Control: Necrotic/Eschar N/A N/A Tissue Debrided: Skin/Epidermis Skin/Epidermis N/A Level: 3.22 19.35 N/A Debridement A (sq cm): rea Curette Curette N/A Instrument: Minimum Minimum N/A Bleeding: Pressure Pressure N/A Hemostasis A chieved: 3 3 N/A Procedural Pain: 2 2 N/A Post Procedural Pain: Procedure was tolerated well Procedure was tolerated well N/A Debridement Treatment Response: 1.4x2.3x0.1 4.5x4.3x0.2 N/A Post Debridement Measurements L x W x D (cm) 0.253 3.039 N/A Post Debridement Volume: (cm) No Abnormalities Noted No Abnormalities Noted N/A Periwound Skin Texture: No Abnormalities Noted No Abnormalities Noted N/A Periwound Skin Moisture: No Abnormalities Noted No Abnormalities Noted N/A Periwound Skin Color: No Abnormality No Abnormality N/A Temperature: Yes Yes N/A Tenderness on Palpation: Debridement Debridement N/A Procedures Performed: Treatment Notes Electronic Signature(s) Signed: 07/31/2022 4:21:02 PM By: Fredirick Maudlin MD FACS Signed: 07/31/2022 4:54:31 PM By: Baruch Gouty RN, BSN Previous Signature: 07/31/2022 4:06:52 PM Version By: Fredirick Maudlin MD FACS Entered By: Fredirick Maudlin on 07/31/2022 16:21:02 -------------------------------------------------------------------------------- Multi-Disciplinary Care Plan Details Patient Name: Date of Service: Lori Hawking Chambers. 07/31/2022 3:30 PM Medical Record Number: [id number] Patient Account Number: [id number] Date of Birth/Sex: Treating RN: [birth year] (46 y.o. Elam Dutch Primary Care Kyle Luppino: Juluis Mire Other Clinician: Referring Daman Steffenhagen: Treating Masen Salvas/Extender: Patrici Ranks in Treatment: [address] [place] Chambers ([id number]) [id number] Page 4 of 9 Multidisciplinary Care Plan reviewed with physician Active  Inactive Pressure Nursing Diagnoses: Knowledge deficit related to causes and risk factors for pressure ulcer development Potential for impaired tissue integrity related to pressure, friction, moisture, and shear Goals: Patient will remain free from development of additional pressure ulcers Date Initiated: 05/24/2022 Date Inactivated: 06/27/2022 Target Resolution Date: 06/30/2022 Goal Status: Met Patient/caregiver will verbalize risk factors for pressure ulcer development Date Initiated: 05/24/2022 Date Inactivated: 06/27/2022 Target Resolution Date: 06/30/2022 Goal Status: Met Patient/caregiver will verbalize understanding of pressure ulcer management Date Initiated: 06/27/2022 Target Resolution Date: 08/22/2022 Goal Status: Active Interventions: Assess: immobility, friction, shearing, incontinence upon admission and as needed Assess potential for pressure ulcer upon admission and as needed Notes: Wound/Skin Impairment Nursing Diagnoses: Impaired tissue integrity Knowledge deficit related to ulceration/compromised skin integrity Goals: Patient will demonstrate a reduced rate of smoking or cessation of smoking Date Initiated: 05/24/2022 Target Resolution Date: 08/22/2022 Goal Status: Active Patient/caregiver will verbalize understanding of skin care regimen Date Initiated: 05/24/2022 Target Resolution Date: 08/22/2022 Goal Status: Active Ulcer/skin breakdown will have a volume reduction of 50% by week 8 Date Initiated: 06/27/2022 Date Inactivated: 07/24/2022 Target Resolution Date: 07/25/2022 Goal Status: Unmet Unmet Reason: infection in foot Ulcer/skin breakdown will  have a volume reduction of 80% by week 12 Date Initiated: 07/24/2022 Target Resolution Date: 08/22/2022 Goal Status: Active Interventions: Assess patient/caregiver ability to obtain necessary supplies Assess ulceration(s) every visit Treatment Activities: Skin care regimen initiated : 05/24/2022 Topical wound  management initiated : 05/24/2022 Notes: Electronic Signature(s) Signed: 07/31/2022 4:54:31 PM By: Baruch Gouty RN, BSN Entered By: Baruch Gouty on 07/31/2022 16:05:55 -------------------------------------------------------------------------------- Pain Assessment Details Patient Name: Date of Service: Lori Hawking Chambers. 07/31/2022 3:30 PM Medical Record Number: [id number] Patient Account Number: [id number] [name], [name] ([id number]) [phone].pdf Page 5 of 9 Date of Birth/Sex: Treating RN: Sep 04, 1976 (46 y.o. Elam Dutch Primary Care Niccolo Burggraf: Other Clinician: Juluis Mire Referring Anjannette Gauger: Treating Johanny Segers/Extender: Patrici Ranks in Treatment: 9 Active Problems Location of Pain Severity and Description of Pain Patient Has Paino Yes Site Locations Pain Location: Pain in Ulcers With Dressing Change: Yes Rate the pain. Current Pain Level: 3 Least Pain Level: 0 Character of Pain Describe the Pain: Tender, Other: sore Pain Management and Medication Current Pain Management: Medication: Yes Is the Current Pain Management Adequate: Adequate How does your wound impact your activities of daily livingo Sleep: No Bathing: No Appetite: No Relationship With Others: No Bladder Continence: No Emotions: No Bowel Continence: No Work: No Toileting: No Drive: No Dressing: No Hobbies: No Electronic Signature(s) Signed: 07/31/2022 4:54:31 PM By: Baruch Gouty RN, BSN Entered By: Baruch Gouty on 07/31/2022 15:51:22 -------------------------------------------------------------------------------- Patient/Caregiver Education Details Patient Name: Date of Service: [name] 11/6/2023andnbsp3:30 PM Medical Record Number: [id number] Patient Account Number: [id number] Date of Birth/Gender: Treating RN: 05-06-1976 (46 y.o. Elam Dutch Primary Care Physician: Juluis Mire Other  Clinician: Referring Physician: Treating Physician/Extender: Patrici Ranks in Treatment: 9 Education Assessment Education Provided To: Patient Education Topics Provided Wound/Skin Impairment: Methods: Explain/Verbal [name], [name] ([id number]) [id number] Page 6 of 9 Responses: Reinforcements needed, State content correctly Electronic Signature(s) Signed: 07/31/2022 4:54:31 PM By: Baruch Gouty RN, BSN Entered By: Baruch Gouty on 07/31/2022 16:06:43 -------------------------------------------------------------------------------- Wound Assessment Details Patient Name: Date of Service: Lori Hawking Chambers. 07/31/2022 3:30 PM Medical Record Number: [id number] Patient Account Number: [id number] Date of Birth/Sex: Treating RN: March 07, 1976 (46 y.o. Elam Dutch Primary Care Melvina Pangelinan: Juluis Mire Other Clinician: Referring Vonceil Upshur: Treating Taleeyah Bora/Extender: Patrici Ranks in Treatment: 9 Wound Status Wound Number: 1 Primary Etiology: Trauma, Other Wound Location: Left, Dorsal Foot Wound Status: Open Wounding Event: Shear/Friction Comorbid Anemia, Lymphedema, Hypertension, Hypotension, History: Cirrhosis Date Acquired: 05/20/2022 Weeks Of Treatment: 9 Clustered Wound: No Photos Wound Measurements Length: (cm) 1.4 Width: (cm) 2.3 Depth: (cm) 0.1 Area: (cm) 2.529 Volume: (cm) 0.253 % Reduction in Area: -257.7% % Reduction in Volume: -256.3% Epithelialization: Small (1-33%) Tunneling: No Undermining: No Wound Description Classification: Full Thickness Without Exposed Suppor Wound Margin: Distinct, outline attached Exudate Amount: Medium Exudate Type: Serosanguineous Exudate Color: red, brown t Structures Foul Odor After Cleansing: No Slough/Fibrino No Wound Bed Granulation Amount: Large (67-100%) Exposed Structure Granulation Quality: Red Fascia Exposed: No Necrotic  Amount: None Present (0%) Fat Layer (Subcutaneous Tissue) Exposed: Yes Tendon Exposed: No Muscle Exposed: No Joint Exposed: No Bone Exposed: No Periwound Skin Texture Texture Color No Abnormalities Noted: Yes No Abnormalities Noted: Yes Moisture Temperature / Pain [name], [name] ([id number]) [id number] Page 7 of 9 No Abnormalities Noted: Yes Temperature: No Abnormality Tenderness on Palpation: Yes Treatment Notes Wound #1 (Foot) Wound Laterality: Dorsal, Left Cleanser Soap and Water Discharge Instruction: May shower and wash wound with dial  antibacterial soap and water prior to dressing change. Wound Cleanser Discharge Instruction: Cleanse the wound with wound cleanser prior to applying a clean dressing using gauze sponges, not tissue or cotton balls. Peri-Wound Care Topical Primary Dressing Promogran Prisma Matrix, 4.34 (sq in) (silver collagen) Discharge Instruction: Moisten collagen with saline or hydrogel Secondary Dressing ABD Pad, 5x9 Discharge Instruction: Apply over primary dressing as directed. Woven Gauze Sponge, Non-Sterile 4x4 in Discharge Instruction: Apply over primary dressing as directed. Secured With Elastic Bandage 4 inch (ACE bandage) Discharge Instruction: Secure with ACE bandage as directed. Kerlix Roll Sterile, 4.5x3.1 (in/yd) Discharge Instruction: Secure with Kerlix as directed. Compression Wrap Compression Stockings Add-Ons Electronic Signature(s) Signed: 07/31/2022 4:54:31 PM By: Baruch Gouty RN, BSN Entered By: Baruch Gouty on 07/31/2022 15:58:05 -------------------------------------------------------------------------------- Wound Assessment Details Patient Name: Date of Service: Lori Hawking Chambers. 07/31/2022 3:30 PM Medical Record Number: [id number] Patient Account Number: [id number] Date of Birth/Sex: Treating RN: 1976-03-22 (46 y.o. Elam Dutch Primary Care [name]: Juluis Mire Other  Clinician: Referring Deegan Valentino: Treating Jorey Dollard/Extender: Patrici Ranks in Treatment: 9 Wound Status Wound Number: 2 Primary Etiology: Trauma, Other Wound Location: Left Gluteal fold Wound Status: Open Wounding Event: Shear/Friction Comorbid Anemia, Lymphedema, Hypertension, Hypotension, History: Cirrhosis Date Acquired: 03/20/2022 Weeks Of Treatment: 9 Clustered Wound: No Photos [name], [name] ([id number]) [phone].pdf Page 8 of 9 Wound Measurements Length: (cm) 4.5 Width: (cm) 4.3 Depth: (cm) 0.2 Area: (cm) 15.197 Volume: (cm) 3.039 % Reduction in Area: 39.9% % Reduction in Volume: 90.8% Epithelialization: Small (1-33%) Tunneling: No Undermining: Yes Starting Position (o'clock): 11 Ending Position (o'clock): 1 Maximum Distance: (cm) 0.7 Wound Description Classification: Full Thickness Without Exposed Support Structures Wound Margin: Distinct, outline attached Exudate Amount: Medium Exudate Type: Serosanguineous Exudate Color: red, brown Foul Odor After Cleansing: No Slough/Fibrino No Wound Bed Granulation Amount: Large (67-100%) Exposed Structure Granulation Quality: Red, Pink Fascia Exposed: No Necrotic Amount: None Present (0%) Fat Layer (Subcutaneous Tissue) Exposed: Yes Tendon Exposed: No Muscle Exposed: No Joint Exposed: No Bone Exposed: No Periwound Skin Texture Texture Color No Abnormalities Noted: Yes No Abnormalities Noted: Yes Moisture Temperature / Pain No Abnormalities Noted: Yes Temperature: No Abnormality Tenderness on Palpation: Yes Treatment Notes Wound #2 (Gluteal fold) Wound Laterality: Left Cleanser Soap and Water Discharge Instruction: May shower and wash wound with dial antibacterial soap and water prior to dressing change. Wound Cleanser Discharge Instruction: Cleanse the wound with wound cleanser prior to applying a clean dressing using gauze sponges, not tissue or cotton  balls. Peri-Wound Care Topical Primary Dressing KerraCel Ag Gelling Fiber Dressing, 4x5 in (silver alginate) Discharge Instruction: Apply silver alginate to wound bed as instructed Secondary Dressing ABD Pad, 5x9 Discharge Instruction: Apply over primary dressing as directed. Woven Gauze Sponge, Non-Sterile 4x4 in Discharge Instruction: Apply over primary dressing as directed. Secured With Compression [name], [name] ([id number]) [id number] Page 9 of 9 Compression Stockings Add-Ons Electronic Signature(s) Signed: 07/31/2022 4:54:31 PM By: Baruch Gouty RN, BSN Entered By: Baruch Gouty on 07/31/2022 16:03:18 -------------------------------------------------------------------------------- Vitals Details Patient Name: Date of Service: Lori Hawking Chambers. 07/31/2022 3:30 PM Medical Record Number: [id number] Patient Account Number: [id number] Date of Birth/Sex: Treating RN: Apr 25, 1976 (46 y.o. Elam Dutch Primary Care Turhan Chill: Juluis Mire Other Clinician: Referring Gaylene Moylan: Treating Jonan Seufert/Extender: Patrici Ranks in Treatment: 9 Vital Signs Time Taken: 15:49 Temperature (F): 98.5 Height (in): 66 Pulse (bpm): 112 Weight (lbs): 197 Respiratory Rate (breaths/min): 18 Body Mass Index (BMI): 31.8 Blood Pressure (mmHg): 120/78 Reference  Range: 80 - 120 mg / dl Electronic Signature(s) Signed: 07/31/2022 4:54:31 PM By: Baruch Gouty RN, BSN Entered By: Baruch Gouty on 07/31/2022 15:50:44

## 2022-07-31 NOTE — Progress Notes (Addendum)
[name], [name] ([id number]) [id number] Page 1 of 10 Visit Report for 07/31/2022 Chief Complaint Document Details Patient Name: Date of Service: AUBREI, BOUCHIE 07/31/2022 3:30 PM Medical Record Number: [id number] Patient Account Number: [id number] Date of Birth/Sex: Treating RN: 10-11-1975 (46 y.o. Tommye Standard Primary Care Provider: Gwinda Passe Other Clinician: Referring Provider: Treating Provider/Extender: Sharyl Nimrod in Treatment: 9 Information Obtained from: Patient Chief Complaint Patient seen for complaints of Non-Healing Wounds. Electronic Signature(s) Signed: 07/31/2022 4:21:09 PM By: Duanne Guess MD FACS Previous Signature: 07/31/2022 4:06:59 PM Version By: Duanne Guess MD FACS Entered By: Duanne Guess on 07/31/2022 16:21:09 -------------------------------------------------------------------------------- Debridement Details Patient Name: Date of Service: Lori Kennedy Chambers. 07/31/2022 3:30 PM Medical Record Number: [id number] Patient Account Number: [id number] Date of Birth/Sex: Treating RN: [birth year] (46 y.o. Tommye Standard Primary Care Provider: Gwinda Passe Other Clinician: Referring Provider: Treating Provider/Extender: Sharyl Nimrod in Treatment: 9 Debridement Performed for Assessment: Wound #2 Left Gluteal fold Performed By: Physician Duanne Guess, MD Debridement Type: Debridement Level of Consciousness (Pre-procedure): Awake and Alert Pre-procedure Verification/Time Out Yes - 16:10 Taken: Start Time: 16:10 Pain Control: Lidocaine 4% T opical Solution T Area Debrided (L x W): otal 4.5 (cm) x 4.3 (cm) = 19.35 (cm) Tissue and other material debrided: Non-Viable, Skin: Epidermis, Biofilm Level: Skin/Epidermis Debridement Description: Selective/Open Wound Instrument: Curette Bleeding: Minimum Hemostasis Achieved: Pressure Procedural  Pain: 3 Post Procedural Pain: 2 Response to Treatment: Procedure was tolerated well Level of Consciousness (Post- Awake and Alert procedure): Post Debridement Measurements of Total Wound Length: (cm) 4.5 Width: (cm) 4.3 Depth: (cm) 0.2 Volume: (cm) 3.039 Character of Wound/Ulcer Post Debridement: Improved Post Procedure Diagnosis [name], [name] ([id number]) [id number] Page 2 of 10 Same as Pre-procedure Notes scribed by Zenaida Deed, RN for Dr. Lady Gary Electronic Signature(s) Signed: 07/31/2022 4:25:02 PM By: Duanne Guess MD FACS Signed: 07/31/2022 4:54:31 PM By: Zenaida Deed RN, BSN Entered By: Zenaida Deed on 07/31/2022 16:14:57 -------------------------------------------------------------------------------- Debridement Details Patient Name: Date of Service: Lori Kennedy Chambers. 07/31/2022 3:30 PM Medical Record Number: [id number] Patient Account Number: [id number] Date of Birth/Sex: Treating RN: 13-Jan-1976 (46 y.o. Tommye Standard Primary Care Provider: Gwinda Passe Other Clinician: Referring Provider: Treating Provider/Extender: Sharyl Nimrod in Treatment: 9 Debridement Performed for Assessment: Wound #1 Left,Dorsal Foot Performed By: Physician Duanne Guess, MD Debridement Type: Debridement Level of Consciousness (Pre-procedure): Awake and Alert Pre-procedure Verification/Time Out Yes - 16:10 Taken: Start Time: 16:10 Pain Control: Lidocaine 4% T opical Solution T Area Debrided (L x W): otal 1.4 (cm) x 2.3 (cm) = 3.22 (cm) Tissue and other material debrided: Non-Viable, Eschar, Skin: Epidermis, Biofilm Level: Skin/Epidermis Debridement Description: Selective/Open Wound Instrument: Curette Bleeding: Minimum Hemostasis Achieved: Pressure Procedural Pain: 3 Post Procedural Pain: 2 Response to Treatment: Procedure was tolerated well Level of Consciousness (Post- Awake and  Alert procedure): Post Debridement Measurements of Total Wound Length: (cm) 1.4 Width: (cm) 2.3 Depth: (cm) 0.1 Volume: (cm) 0.253 Character of Wound/Ulcer Post Debridement: Improved Post Procedure Diagnosis Same as Pre-procedure Notes scribed by Zenaida Deed, RN for Dr. Lady Gary Electronic Signature(s) Signed: 07/31/2022 4:25:02 PM By: Duanne Guess MD FACS Signed: 07/31/2022 4:54:31 PM By: Zenaida Deed RN, BSN Entered By: Zenaida Deed on 07/31/2022 16:15:11 HPI Details -------------------------------------------------------------------------------- [name] ([id number]) [id number] Page 3 of 10 Patient Name: Date of Service: [name], [name] 07/31/2022 3:30 PM Medical Record Number: [id number] Patient Account Number: [id number] Date of Birth/Sex: Treating RN: 1976-09-25 (46 y.o.  Tommye Standard Primary Care Provider: Gwinda Passe Other Clinician: Referring Provider: Treating Provider/Extender: Sharyl Nimrod in Treatment: 9 History of Present Illness HPI Description: ADMISSION 05/24/2022 This is a 46 year old woman with a past medical history significant for hypertension, alcoholic hepatitis, chronic anemia, chronic kidney disease, and peptic ulcer disease. She was admitted to the hospital in the middle of July for hypovolemic shock secondary to dehydration from gastroenteritis. She was found on admission to have an ulcer on her left buttock within the confines of the natal cleft and approaching the anal verge. The etiology was felt to be moisture and friction rather than pressure, as the patient is completely ambulatory. No debridement was performed. She was readmitted at the beginning of August with worsening lower extremity edema and fatigue. The wound was basically unchanged. Wound nursing was consulted and they recommended using silver alginate. She was discharged from the hospital with referral  to the wound care center. This past weekend, she noted a wound on her dorsal left foot that she thinks was secondary to friction from shoes. She is here for evaluation of both these wounds. ABI in clinic today was 1.18. She reports that she has quit drinking but she continues to smoke. On the dorsal surface of her left foot between her fourth and fifth metatarsal heads, there is a circular wound exposing the fat layer. There is some slough and eschar accumulation. It does appear consistent with her history of abrasion. On the medial aspect of her left buttock extending into the natal cleft and towards the anal verge. There is a large ulcer with a very clean surface. There is substantial undermining at the cranial portion of the wound. No purulent drainage or malodor. 06/08/2022: The patient returns today with significant worsening of her dorsal foot wound. Apparently she saw her PCP yesterday who was concerned for cellulitis but did not prescribe any antibiotics. She was also sent for a DVT scan which was negative. There is heavy slough as well as liquefactive necrosis of the fat layer. There is a bulla between the second and third toes that looks as though it may rupture in the near future. The wound undermines under the skin towards this bulla. No frankly purulent drainage or any odor. Her gluteal wound is very clean but is relatively unchanged in size. She has been managing her wounds on her own and finds it difficult to pack the silver alginate into the undermined portion of the buttock wound. She says that she has been putting peroxide on her foot. 06/19/2022: The patient was not seen as a wound care visit last week because she arrived too late to her appointment. She did have a nursing visit, however. The culture that I took on 14 September grew out a polymicrobial population including Bacteroides fragilis Citrobacter Morganella Escherichia coli Enterococcus faecalis Pseudomonas aeruginosa and  many others. I prescribed Bactrim and Augmentin for this polymicrobial infection based upon the resistance genes detected. Keystone topical compounded antibiotic was also ordered and she has been using that on her foot as well. Unfortunately, the wound on her dorsal foot continues to deteriorate. There is a larger area of undermining and more necrotic tissue, including muscle, on the wound surface. No significant malodor or frank pus. Her gluteal wound, on the other hand, looks quite good. It is smaller and very clean and the undermined portion has contracted substantially. 06/27/2022: Last week after our visit, she went to the hospital where she was admitted with sepsis. She underwent  surgical debridement of her dorsal foot wound. Cultures taken intraoperatively were negative and a piece of PuraPly was sutured in place. She is currently taking Augmentin as prescribed on discharge. Her gluteal wound continues to contract. It is more superficial and there is even less undermining. She does have some biofilm accumulation on the surface. The dorsal foot wound is stained yellow from the Xeroform that was applied by podiatry. PuraPly is covering about a third of the wound. The rest of the wound surface is very clean. She did have quite a bit of drainage, but it was not purulent or malodorous. 07/05/2022: The wound on her buttocks continues to contract. It is very clean without any accumulation of slough or other debris. The PuraPly that was on her foot came off with her dressing. There are a couple of sutures remaining around the perimeter of the wound. There is slough buildup but no signs of necrosis. Granulation tissue is starting to form. 10/19; the patient has 2 wounds in the left buttock in the gluteal fold which was a pressure ulcer from a stay in hospital during the summer. She has a more recent wound on the left dorsal foot which was apparently complications of infection. Both wound surfaces look  healthy. She is using silver alginate on the left foot 07/24/2022: I have not seen her in 2 weeks and both wounds have contracted considerably since then. The dorsal foot wound has come in by a centimeter in width since her last visit. The gluteal wound is quite clean; there is a bit of slough buildup on the dorsal foot wound. 07/31/2022: Both wounds are smaller again today. There is some dry eschar around the perimeter of the dorsal foot wound. There is senescent skin that has accumulated around the perimeter of the gluteal wound. No concern for infection. Electronic Signature(s) Signed: 07/31/2022 4:21:50 PM By: Duanne Guess MD FACS Entered By: Duanne Guess on 07/31/2022 16:21:49 -------------------------------------------------------------------------------- Physical Exam Details Patient Name: Date of Service: Lori Kennedy Chambers. 07/31/2022 3:30 PM Medical Record Number: [id number] Patient Account Number: [id number] Date of Birth/Sex: Treating RN: Jan 25, 1976 (46 y.o. Tommye Standard Primary Care Provider: Gwinda Passe Other Clinician: Referring Provider: Treating Provider/Extender: Sharyl Nimrod in Treatment: [address] [name], [name] ([id number]) [id number] Page 4 of 10 . Slightly tachycardic, asymptomatic. . . No acute distress.Marland Kitchen Respiratory Normal work of breathing on room air.. Notes 07/31/2022: Both wounds are smaller again today. There is some dry eschar around the perimeter of the dorsal foot wound. There is senescent skin that has accumulated around the perimeter of the gluteal wound. No concern for infection. Electronic Signature(s) Signed: 07/31/2022 4:23:22 PM By: Duanne Guess MD FACS Entered By: Duanne Guess on 07/31/2022 16:23:22 -------------------------------------------------------------------------------- Physician Orders Details Patient Name: Date of Service: Lori Kennedy Chambers.  07/31/2022 3:30 PM Medical Record Number: [id number] Patient Account Number: [id number] Date of Birth/Sex: Treating RN: 08-May-1976 (46 y.o. [place], Linda Primary Care Provider: Gwinda Passe Other Clinician: Referring Provider: Treating Provider/Extender: Sharyl Nimrod in Treatment: 9 Verbal / Phone Orders: No Diagnosis Coding ICD-10 Coding Code Description [phone] Non-pressure chronic ulcer of other part of left foot with fat layer exposed L98.412 Non-pressure chronic ulcer of buttock with fat layer exposed N18.9 Chronic kidney disease, unspecified K70.10 Alcoholic hepatitis without ascites Follow-up Appointments ppointment in 1 week. - Dr. Lady Gary - Rm 4 Return A Bathing/ Shower/ Hygiene May shower and wash wound with soap and water. - with dressing changes Edema Control -  Lymphedema / SCD / Other Elevate legs to the level of the heart or above for 30 minutes daily and/or when sitting, a frequency of: Avoid standing for long periods of time. Patient to wear own compression stockings every day. Wound Treatment Wound #1 - Foot Wound Laterality: Dorsal, Left Cleanser: Soap and Water Every Other Day/30 Days Discharge Instructions: May shower and wash wound with dial antibacterial soap and water prior to dressing change. Cleanser: Wound Cleanser Every Other Day/30 Days Discharge Instructions: Cleanse the wound with wound cleanser prior to applying a clean dressing using gauze sponges, not tissue or cotton balls. Prim Dressing: Promogran Prisma Matrix, 4.34 (sq in) (silver collagen) Every Other Day/30 Days ary Discharge Instructions: Moisten collagen with saline or hydrogel Secondary Dressing: ABD Pad, 5x9 Every Other Day/30 Days Discharge Instructions: Apply over primary dressing as directed. Secondary Dressing: Woven Gauze Sponge, Non-Sterile 4x4 in Every Other Day/30 Days Discharge Instructions: Apply over primary dressing as directed. Secured  With: Elastic Bandage 4 inch (ACE bandage) Every Other Day/30 Days Discharge Instructions: Secure with ACE bandage as directed. Secured With: [employer], 4.5x3.1 (in/yd) Every Other Day/30 Days Discharge Instructions: Secure with Kerlix as directed. [name], [name] ([id number]) [id number] Page 5 of 10 Wound #2 - Gluteal fold Wound Laterality: Left Cleanser: Soap and Water 1 x Per Day/30 Days Discharge Instructions: May shower and wash wound with dial antibacterial soap and water prior to dressing change. Cleanser: Wound Cleanser 1 x Per Day/30 Days Discharge Instructions: Cleanse the wound with wound cleanser prior to applying a clean dressing using gauze sponges, not tissue or cotton balls. Prim Dressing: KerraCel Ag Gelling Fiber Dressing, 4x5 in (silver alginate) 1 x Per Day/30 Days ary Discharge Instructions: Apply silver alginate to wound bed as instructed Secondary Dressing: ABD Pad, 5x9 1 x Per Day/30 Days Discharge Instructions: Apply over primary dressing as directed. Secondary Dressing: Woven Gauze Sponge, Non-Sterile 4x4 in 1 x Per Day/30 Days Discharge Instructions: Apply over primary dressing as directed. Electronic Signature(s) Signed: 07/31/2022 4:25:02 PM By: Duanne Guess MD FACS Entered By: Duanne Guess on 07/31/2022 16:23:30 -------------------------------------------------------------------------------- Problem List Details Patient Name: Date of Service: Lori Kennedy Chambers. 07/31/2022 3:30 PM Medical Record Number: [id number] Patient Account Number: [id number] Date of Birth/Sex: Treating RN: Dec 11, 1975 (46 y.o. Tommye Standard Primary Care Provider: Gwinda Passe Other Clinician: Referring Provider: Treating Provider/Extender: Sharyl Nimrod in Treatment: 9 Active Problems ICD-10 Encounter Code Description Active Date MDM Diagnosis L97.522 Non-pressure chronic ulcer of other part of  left foot with fat layer exposed 05/24/2022 No Yes L98.412 Non-pressure chronic ulcer of buttock with fat layer exposed 05/24/2022 No Yes N18.9 Chronic kidney disease, unspecified 05/24/2022 No Yes K70.10 Alcoholic hepatitis without ascites 05/24/2022 No Yes Inactive Problems Resolved Problems Electronic Signature(s) Signed: 07/31/2022 4:20:54 PM By: Duanne Guess MD FACS Previous Signature: 07/31/2022 4:18:24 PM Version By: Duanne Guess MD FACS Previous Signature: 07/31/2022 4:06:34 PM Version By: Duanne Guess MD FACS Entered By: Duanne Guess on 07/31/2022 16:20:54 [name] ([id number]) [id number] Page 6 of 10 -------------------------------------------------------------------------------- Progress Note Details Patient Name: Date of Service: [name], [name] 07/31/2022 3:30 PM Medical Record Number: [id number] Patient Account Number: [id number] Date of Birth/Sex: Treating RN: 03/25/1976 (46 y.o. Tommye Standard Primary Care Provider: Gwinda Passe Other Clinician: Referring Provider: Treating Provider/Extender: Sharyl Nimrod in Treatment: 9 Subjective Chief Complaint Information obtained from Patient Patient seen for complaints of Non-Healing Wounds. History of Present Illness (HPI) ADMISSION 05/24/2022 This  is a 46 year old woman with a past medical history significant for hypertension, alcoholic hepatitis, chronic anemia, chronic kidney disease, and peptic ulcer disease. She was admitted to the hospital in the middle of July for hypovolemic shock secondary to dehydration from gastroenteritis. She was found on admission to have an ulcer on her left buttock within the confines of the natal cleft and approaching the anal verge. The etiology was felt to be moisture and friction rather than pressure, as the patient is completely ambulatory. No debridement was performed. She was readmitted at the beginning  of August with worsening lower extremity edema and fatigue. The wound was basically unchanged. Wound nursing was consulted and they recommended using silver alginate. She was discharged from the hospital with referral to the wound care center. This past weekend, she noted a wound on her dorsal left foot that she thinks was secondary to friction from shoes. She is here for evaluation of both these wounds. ABI in clinic today was 1.18. She reports that she has quit drinking but she continues to smoke. On the dorsal surface of her left foot between her fourth and fifth metatarsal heads, there is a circular wound exposing the fat layer. There is some slough and eschar accumulation. It does appear consistent with her history of abrasion. On the medial aspect of her left buttock extending into the natal cleft and towards the anal verge. There is a large ulcer with a very clean surface. There is substantial undermining at the cranial portion of the wound. No purulent drainage or malodor. 06/08/2022: The patient returns today with significant worsening of her dorsal foot wound. Apparently she saw her PCP yesterday who was concerned for cellulitis but did not prescribe any antibiotics. She was also sent for a DVT scan which was negative. There is heavy slough as well as liquefactive necrosis of the fat layer. There is a bulla between the second and third toes that looks as though it may rupture in the near future. The wound undermines under the skin towards this bulla. No frankly purulent drainage or any odor. Her gluteal wound is very clean but is relatively unchanged in size. She has been managing her wounds on her own and finds it difficult to pack the silver alginate into the undermined portion of the buttock wound. She says that she has been putting peroxide on her foot. 06/19/2022: The patient was not seen as a wound care visit last week because she arrived too late to her appointment. She did have a  nursing visit, however. The culture that I took on 14 September grew out a polymicrobial population including Bacteroides fragilis Citrobacter Morganella Escherichia coli Enterococcus faecalis Pseudomonas aeruginosa and many others. I prescribed Bactrim and Augmentin for this polymicrobial infection based upon the resistance genes detected. Keystone topical compounded antibiotic was also ordered and she has been using that on her foot as well. Unfortunately, the wound on her dorsal foot continues to deteriorate. There is a larger area of undermining and more necrotic tissue, including muscle, on the wound surface. No significant malodor or frank pus. Her gluteal wound, on the other hand, looks quite good. It is smaller and very clean and the undermined portion has contracted substantially. 06/27/2022: Last week after our visit, she went to the hospital where she was admitted with sepsis. She underwent surgical debridement of her dorsal foot wound. Cultures taken intraoperatively were negative and a piece of PuraPly was sutured in place. She is currently taking Augmentin as prescribed on discharge. Her  gluteal wound continues to contract. It is more superficial and there is even less undermining. She does have some biofilm accumulation on the surface. The dorsal foot wound is stained yellow from the Xeroform that was applied by podiatry. PuraPly is covering about a third of the wound. The rest of the wound surface is very clean. She did have quite a bit of drainage, but it was not purulent or malodorous. 07/05/2022: The wound on her buttocks continues to contract. It is very clean without any accumulation of slough or other debris. The PuraPly that was on her foot came off with her dressing. There are a couple of sutures remaining around the perimeter of the wound. There is slough buildup but no signs of necrosis. Granulation tissue is starting to form. 10/19; the patient has 2 wounds in the left buttock  in the gluteal fold which was a pressure ulcer from a stay in hospital during the summer. She has a more recent wound on the left dorsal foot which was apparently complications of infection. Both wound surfaces look healthy. She is using silver alginate on the left foot 07/24/2022: I have not seen her in 2 weeks and both wounds have contracted considerably since then. The dorsal foot wound has come in by a centimeter in width since her last visit. The gluteal wound is quite clean; there is a bit of slough buildup on the dorsal foot wound. 07/31/2022: Both wounds are smaller again today. There is some dry eschar around the perimeter of the dorsal foot wound. There is senescent skin that has accumulated around the perimeter of the gluteal wound. No concern for infection. Patient History Information obtained from Patient. Family History Cancer - Mother, Diabetes - Mother, Hypertension - Mother,Maternal Grandparents,Paternal Grandparents, No family history of Heart Disease, Hereditary Spherocytosis, Kidney Disease, Lung Disease, Seizures, Stroke, Thyroid Problems, Tuberculosis. Social History Current every day smoker - .5 pack a day, Marital Status - Single, Alcohol Use - Never, Drug Use - No History, Caffeine Use - Daily. Medical History Hematologic/Lymphatic Patient has history of Anemia, Lymphedema Cardiovascular [name], [name] ([id number]) [id number] Page 7 of 10 Patient has history of Hypertension, Hypotension Gastrointestinal Patient has history of Cirrhosis Endocrine Denies history of Type I Diabetes, Type II Diabetes Hospitalization/Surgery History - esophagogastroduodenoscopy. - biopsy. - cholecystectomy. - breast biopsy. - abdominoplasty. - tummy tuck. - hernia repair. - roux-en-y procedure. - tumor removal. - wrist ganglion incision. Medical A Surgical History Notes nd Gastrointestinal slow GI blood loss, alcoholic hepatitis Genitourinary CKD stage  III Psychiatric depression Objective Constitutional Slightly tachycardic, asymptomatic. No acute distress.. Vitals Time Taken: 3:49 PM, Height: 66 in, Weight: 197 lbs, BMI: 31.8, Temperature: 98.5 F, Pulse: 112 bpm, Respiratory Rate: 18 breaths/min, Blood Pressure: 120/78 mmHg. Respiratory Normal work of breathing on room air.. General Notes: 07/31/2022: Both wounds are smaller again today. There is some dry eschar around the perimeter of the dorsal foot wound. There is senescent skin that has accumulated around the perimeter of the gluteal wound. No concern for infection. Integumentary (Hair, Skin) Wound #1 status is Open. Original cause of wound was Shear/Friction. The date acquired was: 05/20/2022. The wound has been in treatment 9 weeks. The wound is located on the Left,Dorsal Foot. The wound measures 1.4cm length x 2.3cm width x 0.1cm depth; 2.529cm^2 area and 0.253cm^3 volume. There is Fat Layer (Subcutaneous Tissue) exposed. There is no tunneling or undermining noted. There is a medium amount of serosanguineous drainage noted. The wound margin is distinct with the outline  attached to the wound base. There is large (67-100%) red granulation within the wound bed. There is no necrotic tissue within the wound bed. The periwound skin appearance had no abnormalities noted for texture. The periwound skin appearance had no abnormalities noted for moisture. The periwound skin appearance had no abnormalities noted for color. Periwound temperature was noted as No Abnormality. The periwound has tenderness on palpation. Wound #2 status is Open. Original cause of wound was Shear/Friction. The date acquired was: 03/20/2022. The wound has been in treatment 9 weeks. The wound is located on the Left Gluteal fold. The wound measures 4.5cm length x 4.3cm width x 0.2cm depth; 15.197cm^2 area and 3.039cm^3 volume. There is Fat Layer (Subcutaneous Tissue) exposed. There is no tunneling noted, however, there is  undermining starting at 11:00 and ending at 1:00 with a maximum distance of 0.7cm. There is a medium amount of serosanguineous drainage noted. The wound margin is distinct with the outline attached to the wound base. There is large (67-100%) red, pink granulation within the wound bed. There is no necrotic tissue within the wound bed. The periwound skin appearance had no abnormalities noted for texture. The periwound skin appearance had no abnormalities noted for moisture. The periwound skin appearance had no abnormalities noted for color. Periwound temperature was noted as No Abnormality. The periwound has tenderness on palpation. 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 There was a Selective/Open Wound Skin/Epidermis Debridement with a total area of 3.22 sq cm performed by Duanne Guess, MD. With the following instrument(s): Curette to remove Non-Viable tissue/material. Material removed includes Eschar, Skin: Epidermis, and Biofilm after achieving pain control using Lidocaine 4% Topical Solution. No specimens were taken. A time out was conducted at 16:10, prior to the start of the procedure. A Minimum amount of bleeding was controlled with Pressure. The procedure was tolerated well with a pain level of 3 throughout and a pain level of 2 following the procedure. Post Debridement Measurements: 1.4cm length x 2.3cm width x 0.1cm depth; 0.253cm^3 volume. Character of Wound/Ulcer Post Debridement is improved. [name], [name] ([id number]) [id number] Page 8 of 10 Post procedure Diagnosis Wound #1: Same as Pre-Procedure General Notes: scribed by Zenaida Deed,  RN for Dr. Lady Gary. Wound #2 Pre-procedure diagnosis of Wound #2 is a Trauma, Other located on the Left Gluteal fold . There was a Selective/Open Wound Skin/Epidermis Debridement with a total area of 19.35 sq cm performed by Duanne Guess, MD. With the following instrument(s): Curette to remove Non-Viable tissue/material. Material removed includes Skin: Epidermis and Biofilm and after achieving pain control using Lidocaine 4% T opical Solution. No specimens were taken. A time out was conducted at 16:10, prior to the start of the procedure. A Minimum amount of bleeding was controlled with Pressure. The procedure was tolerated well with a pain level of 3 throughout and a pain level of 2 following the procedure. Post Debridement Measurements: 4.5cm length x 4.3cm width x 0.2cm depth; 3.039cm^3 volume. Character of Wound/Ulcer Post Debridement is improved. Post procedure Diagnosis Wound #2: Same as Pre-Procedure General Notes: scribed by Zenaida Deed, RN for Dr. Lady Gary. Plan Follow-up Appointments: Return Appointment in 1 week. - Dr. Lady Gary - Rm 4 Bathing/ Shower/ Hygiene: May shower and wash wound with soap  and water. - with dressing changes Edema Control - Lymphedema / SCD / Other: Elevate legs to the level of the heart or above for 30 minutes daily and/or when sitting, a frequency of: Avoid standing for long periods of time. Patient to wear own compression stockings every day. WOUND #1: - Foot Wound Laterality: Dorsal, Left Cleanser: Soap and Water Every Other Day/30 Days Discharge Instructions: May shower and wash wound with dial antibacterial soap and water prior to dressing change. Cleanser: Wound Cleanser Every Other Day/30 Days Discharge Instructions: Cleanse the wound with wound cleanser prior to applying a clean dressing using gauze sponges, not tissue or cotton balls. Prim Dressing: Promogran Prisma Matrix, 4.34 (sq in) (silver collagen) Every Other Day/30 Days ary Discharge  Instructions: Moisten collagen with saline or hydrogel Secondary Dressing: ABD Pad, 5x9 Every Other Day/30 Days Discharge Instructions: Apply over primary dressing as directed. Secondary Dressing: Woven Gauze Sponge, Non-Sterile 4x4 in Every Other Day/30 Days Discharge Instructions: Apply over primary dressing as directed. Secured With: Elastic Bandage 4 inch (ACE bandage) Every Other Day/30 Days Discharge Instructions: Secure with ACE bandage as directed. Secured With: [employer], 4.5x3.1 (in/yd) Every Other Day/30 Days Discharge Instructions: Secure with Kerlix as directed. WOUND #2: - Gluteal fold Wound Laterality: Left Cleanser: Soap and Water 1 x Per Day/30 Days Discharge Instructions: May shower and wash wound with dial antibacterial soap and water prior to dressing change. Cleanser: Wound Cleanser 1 x Per Day/30 Days Discharge Instructions: Cleanse the wound with wound cleanser prior to applying a clean dressing using gauze sponges, not tissue or cotton balls. Prim Dressing: KerraCel Ag Gelling Fiber Dressing, 4x5 in (silver alginate) 1 x Per Day/30 Days ary Discharge Instructions: Apply silver alginate to wound bed as instructed Secondary Dressing: ABD Pad, 5x9 1 x Per Day/30 Days Discharge Instructions: Apply over primary dressing as directed. Secondary Dressing: Woven Gauze Sponge, Non-Sterile 4x4 in 1 x Per Day/30 Days Discharge Instructions: Apply over primary dressing as directed. 07/31/2022: Both wounds are smaller again today. There is some dry eschar around the perimeter of the dorsal foot wound. There is senescent skin that has accumulated around the perimeter of the gluteal wound. No concern for infection. I used a curette to debride the dry eschar and biofilm from the dorsal foot wound and to debride the senescent skin and biofilm from the gluteal wound. We will continue to use silver alginate on the gluteal wound and silver collagen on the foot wound. Follow-up in  1 week. Electronic Signature(s) Signed: 07/31/2022 4:24:16 PM By: Duanne Guess MD FACS Entered By: Duanne Guess on 07/31/2022 16:24:16 -------------------------------------------------------------------------------- HxROS Details Patient Name: Date of Service: Lori Kennedy Chambers. 07/31/2022 3:30 PM Medical Record Number: [id number] Patient Account Number: [id number] Date of Birth/Sex: Treating RN: 03-Aug-1976 (46 y.o. Tommye Standard Primary Care Provider: Gwinda Passe Other Clinician: Referring Provider: Treating Provider/Extender: [name], [name] ([id number]) [id number] Page 9 of 10 Weeks in Treatment: 9 Information Obtained From Patient Hematologic/Lymphatic Medical History: Positive for: Anemia; Lymphedema Cardiovascular Medical History: Positive for: Hypertension; Hypotension Gastrointestinal Medical History: Positive for: Cirrhosis Past Medical History Notes: slow GI blood loss, alcoholic hepatitis Endocrine Medical History: Negative for: Type I Diabetes; Type II Diabetes Genitourinary Medical History: Past Medical History Notes: CKD stage III Psychiatric Medical History: Past Medical History Notes: depression Immunizations Pneumococcal Vaccine: Received Pneumococcal Vaccination: No Implantable Devices None Hospitalization / Surgery History Type of Hospitalization/Surgery esophagogastroduodenoscopy biopsy cholecystectomy breast biopsy abdominoplasty tummy tuck hernia repair roux-en-y  procedure tumor removal wrist ganglion incision Family and Social History Cancer: Yes - Mother; Diabetes: Yes - Mother; Heart Disease: No; Hereditary Spherocytosis: No; Hypertension: Yes - Mother,Maternal Grandparents,Paternal Grandparents; Kidney Disease: No; Lung Disease: No; Seizures: No; Stroke: No; Thyroid Problems: No; Tuberculosis: No; Current every day smoker - .5 pack a day; Marital  Status - Single; Alcohol Use: Never; Drug Use: No History; Caffeine Use: Daily; Financial Concerns: Yes; Food, Clothing or Shelter Needs: No; Support System Lacking: No; Transportation Concerns: No Psychologist, prison and probation services) Signed: 07/31/2022 4:25:02 PM By: Duanne Guess MD FACS Signed: 07/31/2022 4:54:31 PM By: Zenaida Deed RN, BSN Entered By: Duanne Guess on 07/31/2022 16:22:05 [name], [name] ([id number]) [id number] Page 10 of 10 -------------------------------------------------------------------------------- SuperBill Details Patient Name: Date of Service: JAELYNE, DEEG 07/31/2022 Medical Record Number: [id number] Patient Account Number: [id number] Date of Birth/Sex: Treating RN: 10-24-1975 (46 y.o. Tommye Standard Primary Care Provider: Gwinda Passe Other Clinician: Referring Provider: Treating Provider/Extender: Sharyl Nimrod in Treatment: 9 Diagnosis Coding ICD-10 Codes Code Description [phone] Non-pressure chronic ulcer of other part of left foot with fat layer exposed L98.412 Non-pressure chronic ulcer of buttock with fat layer exposed N18.9 Chronic kidney disease, unspecified K70.10 Alcoholic hepatitis without ascites Facility Procedures : CPT4 Code: [id number] Description: 97597 - DEBRIDE WOUND 1ST 20 SQ CM OR < ICD-10 Diagnosis Description L97.522 Non-pressure chronic ulcer of other part of left foot with fat layer exposed L98.412 Non-pressure chronic ulcer of buttock with fat layer exposed Modifier: Quantity: 1 : CPT4 Code: [id number] Description: 97598 - DEBRIDE WOUND EA ADDL 20 SQ CM ICD-10 Diagnosis Description L97.522 Non-pressure chronic ulcer of other part of left foot with fat layer exposed L98.412 Non-pressure chronic ulcer of buttock with fat layer exposed Modifier: Quantity: 1 Physician Procedures : CPT4 Code Description Modifier [id number] - WC PHYS LEVEL 3 - EST PT 25 ICD-10  Diagnosis Description L97.522 Non-pressure chronic ulcer of other part of left foot with fat layer exposed L98.412 Non-pressure chronic ulcer of buttock with fat layer  exposed N18.9 Chronic kidney disease, unspecified K70.10 Alcoholic hepatitis without ascites Quantity: 1 : [id number] 97597 - WC PHYS DEBR WO ANESTH 20 SQ CM ICD-10 Diagnosis Description L97.522 Non-pressure chronic ulcer of other part of left foot with fat layer exposed L98.412 Non-pressure chronic ulcer of buttock with fat layer exposed Quantity: 1 : [id number] 97598 - WC PHYS DEBR WO ANESTH EA ADD 20 CM ICD-10 Diagnosis Description L97.522 Non-pressure chronic ulcer of other part of left foot with fat layer exposed L98.412 Non-pressure chronic ulcer of buttock with fat layer exposed Quantity: 1 Electronic Signature(s) Signed: 07/31/2022 4:24:50 PM By: Duanne Guess MD FACS Entered By: Duanne Guess on 07/31/2022 16:24:50

## 2022-08-06 ENCOUNTER — Encounter: Payer: Self-pay | Admitting: Certified Registered Nurse Anesthetist

## 2022-08-07 NOTE — Progress Notes (Signed)
Immune to hepatitis A and B Must stop drinking all alcohol AFP elevated.  Unfortunately her creatinine is 1.4-would like to avoid IV CT contrast Ceruloplasmin borderline low. 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

## 2022-08-08 ENCOUNTER — Encounter (HOSPITAL_BASED_OUTPATIENT_CLINIC_OR_DEPARTMENT_OTHER): Payer: Self-pay | Admitting: General Surgery

## 2022-08-08 ENCOUNTER — Other Ambulatory Visit: Payer: Self-pay

## 2022-08-08 DIAGNOSIS — R772 Abnormality of alphafetoprotein: Secondary | ICD-10-CM

## 2022-08-08 DIAGNOSIS — K7030 Alcoholic cirrhosis of liver without ascites: Secondary | ICD-10-CM

## 2022-08-08 DIAGNOSIS — R7989 Other specified abnormal findings of blood chemistry: Secondary | ICD-10-CM

## 2022-08-09 ENCOUNTER — Inpatient Hospital Stay (INDEPENDENT_AMBULATORY_CARE_PROVIDER_SITE_OTHER): Payer: Self-pay | Admitting: Primary Care

## 2022-08-09 ENCOUNTER — Other Ambulatory Visit (INDEPENDENT_AMBULATORY_CARE_PROVIDER_SITE_OTHER): Payer: Self-pay | Admitting: Primary Care

## 2022-08-09 DIAGNOSIS — R600 Localized edema: Secondary | ICD-10-CM

## 2022-08-10 ENCOUNTER — Encounter (HOSPITAL_BASED_OUTPATIENT_CLINIC_OR_DEPARTMENT_OTHER): Payer: Self-pay | Admitting: General Surgery

## 2022-08-10 ENCOUNTER — Other Ambulatory Visit: Payer: Self-pay

## 2022-08-11 NOTE — Progress Notes (Signed)
[name] ([id number]) [id number] Page 1 of 9 Visit Report for 08/10/2022 Arrival Information Details Patient Name: Date of Service: [name], [name] 08/10/2022 3:15 PM Medical Record Number: [id number] Patient Account Number: [id number] Date of Birth/Sex: Treating RN: 1976-02-11 (46 y.o. [name] Primary Care [name]: [name] Other Clinician: Referring [name]: Treating [name]/Extender: [name] in Treatment: 11 Visit Information History Since Last Visit Added or deleted any medications: No Patient Arrived: Ambulatory Any new allergies or adverse reactions: No Arrival Time: 15:50 Signs or symptoms of abuse/neglect since last visito No Accompanied By: self Hospitalized since last visit: No Transfer Assistance: None Implantable device outside of the clinic excluding No Patient Requires Transmission-Based Precautions: No cellular tissue based products placed in the center Patient Has Alerts: No since last visit: Has Dressing in Place as Prescribed: Yes Pain Present Now: Yes Electronic Signature(s) Signed: 08/10/2022 4:56:15 PM By: Lori East RN Entered By: [name] on 08/10/2022 15:53:47 -------------------------------------------------------------------------------- Encounter Discharge Information Details Patient Name: Date of Service: Lori Hawking Chambers. 08/10/2022 3:15 PM Medical Record Number: [id number] Patient Account Number: [id number] Date of Birth/Sex: Treating RN: 10-Apr-1976 (46 y.o. [name] Primary Care [name]: [name] Other Clinician: Referring [name]: Treating [name]/Extender: [name] in Treatment: 11 Encounter Discharge Information Items Post Procedure Vitals Discharge Condition: Stable Temperature (F): 98.2 Ambulatory Status: Ambulatory Pulse (bpm): 98 Discharge Destination: Home Respiratory Rate  (breaths/min): 18 Transportation: Private Auto Blood Pressure (mmHg): 108/67 Accompanied By: self Schedule Follow-up Appointment: Yes Clinical Summary of Care: Patient Declined Electronic Signature(s) Signed: 08/10/2022 5:29:32 PM By: Lori Catholic RN Entered By: [name] on 08/10/2022 17:26:38 [name] ([id number]) [id number] Page 2 of 9 -------------------------------------------------------------------------------- Lower Extremity Assessment Details Patient Name: Date of Service: [name], [name] 08/10/2022 3:15 PM Medical Record Number: [id number] Patient Account Number: [id number] Date of Birth/Sex: Treating RN: [birth year] (46 y.o. [name] Primary Care Terre Zabriskie: [name] Other Clinician: Referring [name]: Treating [name]/Extender: [name] in Treatment: 11 Edema Assessment Assessed: [Left: No] [Right: No] Edema: [Left: Ye] [Right: s] Calf Left: Right: Point of Measurement: From Medial Instep 38 cm Ankle Left: Right: Point of Measurement: From Medial Instep 30 cm Vascular Assessment Pulses: Dorsalis Pedis Palpable: [Left:Yes] Electronic Signature(s) Signed: 08/10/2022 4:56:15 PM By: Lori East RN Entered By: [name] on 08/10/2022 15:56:19 -------------------------------------------------------------------------------- Multi Wound Chart Details Patient Name: Date of Service: Lori Hawking Chambers. 08/10/2022 3:15 PM Medical Record Number: [id number] Patient Account Number: [id number] Date of Birth/Sex: Treating RN: 1976-07-03 (46 y.o. F) Primary Care [name]: [name] Other Clinician: Referring [name]: Treating [name]/Extender: [name] in Treatment: 11 Vital Signs Height(in): 66 Pulse(bpm): 98 Weight(lbs): 197 Blood Pressure(mmHg): 108/67 Body Mass Index(BMI): 31.8 Temperature(F): 98.2 Respiratory  Rate(breaths/min): 18 [1:Photos:] [N/A:N/A] Left, Dorsal Foot Left Gluteal fold N/A Wound Location: Shear/Friction Shear/Friction N/A Wounding Event: Trauma, Other Trauma, Other N/A Primary Etiology: Anemia, Lymphedema, Hypertension, Anemia, Lymphedema, Hypertension, N/A Comorbid History: Hypotension, Cirrhosis Hypotension, Cirrhosis Counts, [name] ([id number]) [phone].pdf Page 3 of 9 05/20/2022 03/20/2022 N/A Date Acquired: 55 11 N/A Weeks of Treatment: Open Open N/A Wound Status: No No N/A Wound Recurrence: 0.3x1.3x0.1 4.5x4x0.1 N/A Measurements L x W x D (cm) 0.306 14.137 N/A A (cm) : rea 0.031 1.414 N/A Volume (cm) : 56.70% 44.10% N/A % Reduction in Area: 56.30% 95.70% N/A % Reduction in Volume: Full Thickness Without Exposed Full Thickness Without Exposed N/A Classification: Support Structures Support Structures Medium  Medium N/A Exudate A mount: Serosanguineous Serosanguineous N/A Exudate Type: red, Chambers red, Chambers N/A Exudate Color: Distinct, outline attached Distinct, outline attached N/A Wound Margin: Large (67-100%) Large (67-100%) N/A Granulation A mount: Red Red, Pink N/A Granulation Quality: None Present (0%) None Present (0%) N/A Necrotic A mount: Fat Layer (Subcutaneous Tissue): Yes Fat Layer (Subcutaneous Tissue): Yes N/A Exposed Structures: Fascia: No Fascia: No Tendon: No Tendon: No Muscle: No Muscle: No Joint: No Joint: No Bone: No Bone: No Small (1-33%) Small (1-33%) N/A Epithelialization: Debridement - Selective/Open Wound Debridement - Selective/Open Wound N/A Debridement: Pre-procedure Verification/Time Out 16:10 16:10 N/A Taken: Lidocaine 4% Topical Solution Lidocaine 4% Topical Solution N/A Pain Control: Necrotic/Eschar Slough N/A Tissue Debrided: Non-Viable Tissue Non-Viable Tissue N/A Level: 0.39 18 N/A Debridement A (sq cm): rea Curette Curette N/A Instrument: Minimum Minimum  N/A Bleeding: Pressure Pressure N/A Hemostasis A chieved: 0 0 N/A Procedural Pain: 0 0 N/A Post Procedural Pain: Procedure was tolerated well Procedure was tolerated well N/A Debridement Treatment Response: 0.3x1.3x0.1 4.5x4x0.1 N/A Post Debridement Measurements L x W x D (cm) 0.031 1.414 N/A Post Debridement Volume: (cm) No Abnormalities Noted No Abnormalities Noted N/A Periwound Skin Texture: No Abnormalities Noted No Abnormalities Noted N/A Periwound Skin Moisture: No Abnormalities Noted No Abnormalities Noted N/A Periwound Skin Color: No Abnormality No Abnormality N/A Temperature: Yes Yes N/A Tenderness on Palpation: Debridement Debridement N/A Procedures Performed: Treatment Notes Electronic Signature(s) Signed: 08/10/2022 4:16:58 PM By: Fredirick Maudlin MD FACS Entered By: Fredirick Maudlin on 08/10/2022 16:16:57 -------------------------------------------------------------------------------- Multi-Disciplinary Care Plan Details Patient Name: Date of Service: Lori Hawking Chambers. 08/10/2022 3:15 PM Medical Record Number: [id number] Patient Account Number: [id number] Date of Birth/Sex: Treating RN: [birth year] (46 y.o. [name] Primary Care Troy Kanouse: [name] Other Clinician: Referring Marietta Sikkema: Treating Lauris Serviss/Extender: [name] in Treatment: Elmer reviewed with physician Active Inactive Pressure Nursing Diagnoses: LYRIS, HITCHMAN ([id number]) [phone].pdf Page 4 of 9 Knowledge deficit related to causes and risk factors for pressure ulcer development Potential for impaired tissue integrity related to pressure, friction, moisture, and shear Goals: Patient will remain free from development of additional pressure ulcers Date Initiated: 05/24/2022 Date Inactivated: 06/27/2022 Target Resolution Date: 06/30/2022 Goal Status: Met Patient/caregiver will verbalize  risk factors for pressure ulcer development Date Initiated: 05/24/2022 Date Inactivated: 06/27/2022 Target Resolution Date: 06/30/2022 Goal Status: Met Patient/caregiver will verbalize understanding of pressure ulcer management Date Initiated: 06/27/2022 Target Resolution Date: 11/23/2022 Goal Status: Active Interventions: Assess: immobility, friction, shearing, incontinence upon admission and as needed Assess potential for pressure ulcer upon admission and as needed Notes: Wound/Skin Impairment Nursing Diagnoses: Impaired tissue integrity Knowledge deficit related to ulceration/compromised skin integrity Goals: Patient will demonstrate a reduced rate of smoking or cessation of smoking Date Initiated: 05/24/2022 Target Resolution Date: 11/23/2022 Goal Status: Active Patient/caregiver will verbalize understanding of skin care regimen Date Initiated: 05/24/2022 Target Resolution Date: 11/23/2022 Goal Status: Active Ulcer/skin breakdown will have a volume reduction of 50% by week 8 Date Initiated: 06/27/2022 Date Inactivated: 07/24/2022 Target Resolution Date: 07/25/2022 Goal Status: Unmet Unmet Reason: infection in foot Ulcer/skin breakdown will have a volume reduction of 80% by week 12 Date Initiated: 07/24/2022 Target Resolution Date: 11/23/2022 Goal Status: Active Interventions: Assess patient/caregiver ability to obtain necessary supplies Assess ulceration(s) every visit Treatment Activities: Skin care regimen initiated : 05/24/2022 Topical wound management initiated : 05/24/2022 Notes: Electronic Signature(s) Signed: 08/10/2022 5:29:32 PM By: Lori Catholic RN Entered By: [name] on 08/10/2022 17:25:24 -------------------------------------------------------------------------------- Pain Assessment  Details Patient Name: Date of Service: YITTA, GONGAWARE 08/10/2022 3:15 PM Medical Record Number: [id number] Patient Account Number: [id number] Date of Birth/Sex:  Treating RN: May 29, 1976 (46 y.o. [name] Primary Care Tien Spooner: [name] Other Clinician: Referring Agnieszka Newhouse: Treating Jadea Shiffer/Extender: [name] in Treatment: 11 Active Problems Location of Pain Severity and Description of Pain Patient Has Paino Yes Site Locations [name], [name] ([id number]) [id number] Page 5 of 9 Site Locations Rate the pain. Current Pain Level: 3 Character of Pain Describe the Pain: Aching Pain Management and Medication Current Pain Management: Electronic Signature(s) Signed: 08/10/2022 4:56:15 PM By: Lori East RN Entered By: [name] on 08/10/2022 15:54:10 -------------------------------------------------------------------------------- Patient/Caregiver Education Details Patient Name: Date of Service: [name] 11/16/2023andnbsp3:15 PM Medical Record Number: [id number] Patient Account Number: [id number] Date of Birth/Gender: Treating RN: 08/07/1976 (46 y.o. [name] Primary Care Physician: [name] Other Clinician: Referring Physician: Treating Physician/Extender: [name] in Treatment: 11 Education Assessment Education Provided To: Patient Education Topics Provided Wound/Skin Impairment: Methods: Explain/Verbal Responses: Return demonstration correctly Electronic Signature(s) Signed: 08/10/2022 5:29:32 PM By: Lori Catholic RN Entered By: [name] on 08/10/2022 17:25:38 -------------------------------------------------------------------------------- Wound Assessment Details Patient Name: Date of Service: Lori Hawking Chambers. 08/10/2022 3:15 PM [name] ([id number]) [id number] Page 6 of 9 Medical Record Number: [id number] Patient Account Number: [id number] Date of Birth/Sex: Treating RN: 08-28-1976 (46 y.o. [name], [name] Primary Care Darene Nappi: [name] Other Clinician: Referring Ata Pecha: Treating Orvilla Truett/Extender: [name] in Treatment: 11 Wound Status Wound Number: 1 Primary Etiology: Trauma, Other Wound Location: Left, Dorsal Foot Wound Status: Open Wounding Event: Shear/Friction Comorbid Anemia, Lymphedema, Hypertension, Hypotension, History: Cirrhosis Date Acquired: 05/20/2022 Weeks Of Treatment: 11 Clustered Wound: No Photos Wound Measurements Length: (cm) 0.3 Width: (cm) 1.3 Depth: (cm) 0.1 Area: (cm) 0.306 Volume: (cm) 0.031 % Reduction in Area: 56.7% % Reduction in Volume: 56.3% Epithelialization: Small (1-33%) Wound Description Classification: Full Thickness Without Exposed Support Structures Wound Margin: Distinct, outline attached Exudate Amount: Medium Exudate Type: Serosanguineous Exudate Color: red, Chambers Foul Odor After Cleansing: No Slough/Fibrino No Wound Bed Granulation Amount: Large (67-100%) Exposed Structure Granulation Quality: Red Fascia Exposed: No Necrotic Amount: None Present (0%) Fat Layer (Subcutaneous Tissue) Exposed: Yes Tendon Exposed: No Muscle Exposed: No Joint Exposed: No Bone Exposed: No Periwound Skin Texture Texture Color No Abnormalities Noted: Yes No Abnormalities Noted: Yes Moisture Temperature / Pain No Abnormalities Noted: Yes Temperature: No Abnormality Tenderness on Palpation: Yes Treatment Notes Wound #1 (Foot) Wound Laterality: Dorsal, Left Cleanser Soap and Water Discharge Instruction: May shower and wash wound with dial antibacterial soap and water prior to dressing change. Wound Cleanser Discharge Instruction: Cleanse the wound with wound cleanser prior to applying a clean dressing using gauze sponges, not tissue or cotton balls. Peri-Wound Care Topical Primary Dressing [name], [name] ([id number]) [id number] Page 7 of 9 Promogran Prisma Matrix, 4.34 (sq in) (silver  collagen) Discharge Instruction: Moisten collagen with saline or hydrogel Secondary Dressing ABD Pad, 5x9 Discharge Instruction: Apply over primary dressing as directed. Woven Gauze Sponge, Non-Sterile 4x4 in Discharge Instruction: Apply over primary dressing as directed. Secured With Elastic Bandage 4 inch (ACE bandage) Discharge Instruction: Secure with ACE bandage as directed. Kerlix Roll Sterile, 4.5x3.1 (in/yd) Discharge Instruction: Secure with Kerlix as directed. Compression Wrap Compression Stockings Add-Ons Electronic Signature(s) Signed: 08/10/2022 4:56:15 PM By: Lori East RN Signed: 08/10/2022 5:29:32 PM By: Lori Catholic RN Entered By: [name] on  08/10/2022 16:05:43 -------------------------------------------------------------------------------- Wound Assessment Details Patient Name: Date of Service: [name], [name] 08/10/2022 3:15 PM Medical Record Number: [id number] Patient Account Number: [id number] Date of Birth/Sex: Treating RN: [birth year] (46 y.o. [name], [name] Primary Care Provider: Juluis Chambers Other Clinician: Referring Provider: Treating Provider/Extender: [name] in Treatment: 11 Wound Status Wound Number: 2 Primary Etiology: Trauma, Other Wound Location: Left Gluteal fold Wound Status: Open Wounding Event: Shear/Friction Comorbid Anemia, Lymphedema, Hypertension, Hypotension, History: Cirrhosis Date Acquired: 03/20/2022 Weeks Of Treatment: 11 Clustered Wound: No Photos Wound Measurements Length: (cm) 4.5 Width: (cm) 4 Depth: (cm) 0.1 Area: (cm) 14.137 Volume: (cm) 1.414 % Reduction in Area: 44.1% % Reduction in Volume: 95.7% Epithelialization: Small (1-33%) Wound Description Classification: Full Thickness Without Exposed Support Structures [name], [name] ([id number]) Wound Margin: Distinct, outline attached Exudate Amount: Medium Exudate Type: Serosanguineous Exudate Color:  red, Chambers Foul Odor After Cleansing: No [id number] Page 8 of 9 Slough/Fibrino No Wound Bed Granulation Amount: Large (67-100%) Exposed Structure Granulation Quality: Red, Pink Fascia Exposed: No Necrotic Amount: None Present (0%) Fat Layer (Subcutaneous Tissue) Exposed: Yes Tendon Exposed: No Muscle Exposed: No Joint Exposed: No Bone Exposed: No Periwound Skin Texture Texture Color No Abnormalities Noted: Yes No Abnormalities Noted: Yes Moisture Temperature / Pain No Abnormalities Noted: Yes Temperature: No Abnormality Tenderness on Palpation: Yes Treatment Notes Wound #2 (Gluteal fold) Wound Laterality: Left Cleanser Soap and Water Discharge Instruction: May shower and wash wound with dial antibacterial soap and water prior to dressing change. Wound Cleanser Discharge Instruction: Cleanse the wound with wound cleanser prior to applying a clean dressing using gauze sponges, not tissue or cotton balls. Peri-Wound Care Topical Primary Dressing KerraCel Ag Gelling Fiber Dressing, 4x5 in (silver alginate) Discharge Instruction: Apply silver alginate to wound bed as instructed Secondary Dressing ABD Pad, 5x9 Discharge Instruction: Apply over primary dressing as directed. Woven Gauze Sponge, Non-Sterile 4x4 in Discharge Instruction: Apply over primary dressing as directed. Secured With Compression Wrap Compression Stockings Environmental education officer) Signed: 08/10/2022 4:56:15 PM By: Lori East RN Signed: 08/10/2022 5:29:32 PM By: Lori Catholic RN Entered By: [name] on 08/10/2022 16:06:19 -------------------------------------------------------------------------------- Vitals Details Patient Name: Date of Service: Lori Hawking Chambers. 08/10/2022 3:15 PM Medical Record Number: [id number] Patient Account Number: [id number] Date of Birth/Sex: Treating RN: [birth year] (46 y.o. [name] Primary Care Provider: Juluis Chambers Other Clinician: Referring Provider: Treating Provider/Extender: [name] in Treatment: [address], [place] Chambers ([id number]) [id number] Page 9 of 9 Vital Signs Time Taken: 15:53 Temperature (F): 98.2 Height (in): 66 Pulse (bpm): 98 Weight (lbs): 197 Respiratory Rate (breaths/min): 18 Body Mass Index (BMI): 31.8 Blood Pressure (mmHg): 108/67 Reference Range: 80 - 120 mg / dl Electronic Signature(s) Signed: 08/10/2022 4:56:15 PM By: Lori East RN Entered By: [name] on 08/10/2022 15:54:02

## 2022-08-11 NOTE — Progress Notes (Signed)
[name], TRACH R ([id number]) [id number] Page 1 of 11 Visit Report for 08/10/2022 Chief Complaint Document Details Patient Name: Date of Service: [name], [name] 08/10/2022 3:15 PM Medical Record Number: [id number] Patient Account Number: [id number] Date of Birth/Sex: Treating RN: May 03, 1976 (46 y.o. F) Primary Care Provider: Juluis Mire Other Clinician: Referring Provider: Treating Provider/Extender: Patrici Ranks in Treatment: 11 Information Obtained from: Patient Chief Complaint Patient seen for complaints of Non-Healing Wounds. Electronic Signature(s) Signed: 08/10/2022 4:17:35 PM By: Fredirick Maudlin MD FACS Entered By: Fredirick Maudlin on 08/10/2022 16:17:35 -------------------------------------------------------------------------------- Debridement Details Patient Name: Date of Service: Lori Hawking R. 08/10/2022 3:15 PM Medical Record Number: [id number] Patient Account Number: [id number] Date of Birth/Sex: Treating RN: 07-21-1976 (46 y.o. [name] Primary Care Provider: Juluis Mire Other Clinician: Referring Provider: Treating Provider/Extender: Patrici Ranks in Treatment: 11 Debridement Performed for Assessment: Wound #1 Left,Dorsal Foot Performed By: Physician Fredirick Maudlin, MD Debridement Type: Debridement Level of Consciousness (Pre-procedure): Awake and Alert Pre-procedure Verification/Time Out Yes - 16:10 Taken: Start Time: 16:10 Pain Control: Lidocaine 4% T opical Solution T Area Debrided (L x W): otal 0.3 (cm) x 1.3 (cm) = 0.39 (cm) Tissue and other material debrided: Non-Viable, Eschar Level: Non-Viable Tissue Debridement Description: Selective/Open Wound Instrument: Curette Bleeding: Minimum Hemostasis Achieved: Pressure End Time: 16:12 Procedural Pain: 0 Post Procedural Pain: 0 Response to Treatment: Procedure was tolerated well Level  of Consciousness (Post- Awake and Alert procedure): Post Debridement Measurements of Total Wound Length: (cm) 0.3 Width: (cm) 1.3 Depth: (cm) 0.1 Volume: (cm) 0.031 Character of Wound/Ulcer Post Debridement: Improved Post Procedure Diagnosis LOLITA, LECKER R ([id number] Page 2 of 11 Same as Pre-procedure Notes Scribed for Dr. Celine Ahr by J.Scotton Electronic Signature(s) Signed: 08/10/2022 4:25:46 PM By: Fredirick Maudlin MD FACS Signed: 08/10/2022 5:29:32 PM By: Dellie Catholic RN Entered By: Dellie Catholic on 08/10/2022 16:13:53 -------------------------------------------------------------------------------- Debridement Details Patient Name: Date of Service: Lori Hawking R. 08/10/2022 3:15 PM Medical Record Number: [id number] Patient Account Number: [id number] Date of Birth/Sex: Treating RN: 11-16-1975 (46 y.o. [name] Primary Care Provider: Juluis Mire Other Clinician: Referring Provider: Treating Provider/Extender: Patrici Ranks in Treatment: 11 Debridement Performed for Assessment: Wound #2 Left Gluteal fold Performed By: Physician Fredirick Maudlin, MD Debridement Type: Debridement Level of Consciousness (Pre-procedure): Awake and Alert Pre-procedure Verification/Time Out Yes - 16:10 Taken: Start Time: 16:10 Pain Control: Lidocaine 4% T opical Solution T Area Debrided (L x W): otal 4.5 (cm) x 4 (cm) = 18 (cm) Tissue and other material debrided: Non-Viable, [place] Level: Non-Viable Tissue Debridement Description: Selective/Open Wound Instrument: Curette Bleeding: Minimum Hemostasis Achieved: Pressure End Time: 16:12 Procedural Pain: 0 Post Procedural Pain: 0 Response to Treatment: Procedure was tolerated well Level of Consciousness (Post- Awake and Alert procedure): Post Debridement Measurements of Total Wound Length: (cm) 4.5 Width: (cm) 4 Depth: (cm) 0.1 Volume:  (cm) 1.414 Character of Wound/Ulcer Post Debridement: Improved Post Procedure Diagnosis Same as Pre-procedure Notes Scribed for Dr. Celine Ahr by J.Scotton Electronic Signature(s) Signed: 08/10/2022 4:25:46 PM By: Fredirick Maudlin MD FACS Signed: 08/10/2022 5:29:32 PM By: Dellie Catholic RN Entered By: Dellie Catholic on 08/10/2022 [place], [place] ([id number]) [id number] Page 3 of 11 -------------------------------------------------------------------------------- HPI Details Patient Name: Date of Service: Lori, Chambers 08/10/2022 3:15 PM Medical Record Number: [id number] Patient Account Number: [id number] Date of Birth/Sex: Treating RN: January 21, 1976 (46 y.o. F) Primary Care Provider: Juluis Mire Other Clinician: Referring Provider: Treating Provider/Extender: Burnadette Pop,  Zenaida Niece in Treatment: 11 History of Present Illness HPI Description: ADMISSION 05/24/2022 This is a 46 year old woman with a past medical history significant for hypertension, alcoholic hepatitis, chronic anemia, chronic kidney disease, and peptic ulcer disease. She was admitted to the hospital in the middle of July for hypovolemic shock secondary to dehydration from gastroenteritis. She was found on admission to have an ulcer on her left buttock within the confines of the natal cleft and approaching the anal verge. The etiology was felt to be moisture and friction rather than pressure, as the patient is completely ambulatory. No debridement was performed. She was readmitted at the beginning of August with worsening lower extremity edema and fatigue. The wound was basically unchanged. Wound nursing was consulted and they recommended using silver alginate. She was discharged from the hospital with referral to the wound care center. This past weekend, she noted a wound on her dorsal left foot that she thinks was secondary to friction from shoes. She is  here for evaluation of both these wounds. ABI in clinic today was 1.18. She reports that she has quit drinking but she continues to smoke. On the dorsal surface of her left foot between her fourth and fifth metatarsal heads, there is a circular wound exposing the fat layer. There is some slough and eschar accumulation. It does appear consistent with her history of abrasion. On the medial aspect of her left buttock extending into the natal cleft and towards the anal verge. There is a large ulcer with a very clean surface. There is substantial undermining at the cranial portion of the wound. No purulent drainage or malodor. 06/08/2022: The patient returns today with significant worsening of her dorsal foot wound. Apparently she saw her PCP yesterday who was concerned for cellulitis but did not prescribe any antibiotics. She was also sent for a DVT scan which was negative. There is heavy slough as well as liquefactive necrosis of the fat layer. There is a bulla between the second and third toes that looks as though it may rupture in the near future. The wound undermines under the skin towards this bulla. No frankly purulent drainage or any odor. Her gluteal wound is very clean but is relatively unchanged in size. She has been managing her wounds on her own and finds it difficult to pack the silver alginate into the undermined portion of the buttock wound. She says that she has been putting peroxide on her foot. 06/19/2022: The patient was not seen as a wound care visit last week because she arrived too late to her appointment. She did have a nursing visit, however. The culture that I took on 14 September grew out a polymicrobial population including Bacteroides fragilis Citrobacter Morganella Escherichia coli Enterococcus faecalis Pseudomonas aeruginosa and many others. I prescribed Bactrim and Augmentin for this polymicrobial infection based upon the resistance genes detected. Keystone topical compounded  antibiotic was also ordered and she has been using that on her foot as well. Unfortunately, the wound on her dorsal foot continues to deteriorate. There is a larger area of undermining and more necrotic tissue, including muscle, on the wound surface. No significant malodor or frank pus. Her gluteal wound, on the other hand, looks quite good. It is smaller and very clean and the undermined portion has contracted substantially. 06/27/2022: Last week after our visit, she went to the hospital where she was admitted with sepsis. She underwent surgical debridement of her dorsal foot wound. Cultures taken intraoperatively were negative and a piece of PuraPly  was sutured in place. She is currently taking Augmentin as prescribed on discharge. Her gluteal wound continues to contract. It is more superficial and there is even less undermining. She does have some biofilm accumulation on the surface. The dorsal foot wound is stained yellow from the Xeroform that was applied by podiatry. PuraPly is covering about a third of the wound. The rest of the wound surface is very clean. She did have quite a bit of drainage, but it was not purulent or malodorous. 07/05/2022: The wound on her buttocks continues to contract. It is very clean without any accumulation of slough or other debris. The PuraPly that was on her foot came off with her dressing. There are a couple of sutures remaining around the perimeter of the wound. There is slough buildup but no signs of necrosis. Granulation tissue is starting to form. 10/19; the patient has 2 wounds in the left buttock in the gluteal fold which was a pressure ulcer from a stay in hospital during the summer. She has a more recent wound on the left dorsal foot which was apparently complications of infection. Both wound surfaces look healthy. She is using silver alginate on the left foot 07/24/2022: I have not seen her in 2 weeks and both wounds have contracted considerably since then.  The dorsal foot wound has come in by a centimeter in width since her last visit. The gluteal wound is quite clean; there is a bit of slough buildup on the dorsal foot wound. 07/31/2022: Both wounds are smaller again today. There is some dry eschar around the perimeter of the dorsal foot wound. There is senescent skin that has accumulated around the perimeter of the gluteal wound. No concern for infection. 08/10/2022: The wounds continue to improve. The dorsal foot wound is nearly closed with just a linear opening and some light eschar. The gluteal wound has a little slough on the surface. She has some newly opened hidradenitis lesions, however. In the past, I had noted her scars, but she had not shown any signs of active disease. She does report that she has had previous areas open in her armpits and groin area, similar to those in her natal cleft. Electronic Signature(s) Signed: 08/10/2022 4:19:08 PM By: Fredirick Maudlin MD FACS Entered By: Fredirick Maudlin on 08/10/2022 16:19:08 -------------------------------------------------------------------------------- Physical Exam Details Patient Name: Date of Service: Lori Hawking R. 08/10/2022 3:15 PM Medical Record Number: [id number] Patient Account Number: [id number] Date of Birth/Sex: Treating RN: 1976/04/20 (46 y.o. F) Primary Care Provider: Juluis Mire Other Clinician: [name] ([id number]) [id number] Page 4 of 11 Referring Provider: Treating Provider/Extender: Patrici Ranks in Treatment: 11 Constitutional . . . . No acute distress.Marland Kitchen Respiratory Normal work of breathing on room air.. Notes 08/10/2022: The wounds continue to improve. The dorsal foot wound is nearly closed with just a linear opening and some light eschar. The gluteal wound has a little slough on the surface. She has some newly opened hidradenitis lesions, however. Electronic Signature(s) Signed:  08/10/2022 4:19:55 PM By: Fredirick Maudlin MD FACS Entered By: Fredirick Maudlin on 08/10/2022 16:19:55 -------------------------------------------------------------------------------- Physician Orders Details Patient Name: Date of Service: Lori Hawking R. 08/10/2022 3:15 PM Medical Record Number: [id number] Patient Account Number: [id number] Date of Birth/Sex: Treating RN: [birth year] (47 y.o. 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 - Dr. Lady Gary Room 4 Return A Bathing/ Shower/ Hygiene May shower and wash wound with soap and water. - with dressing changes Edema Control - Lymphedema / SCD / Other Elevate legs to the level of the heart or above for 30 minutes daily and/or when sitting, a frequency of: Avoid standing for long periods of time. Patient to wear own compression stockings every day. Wound Treatment Wound #1 - Foot Wound Laterality: Dorsal, Left Cleanser: Soap and Water Every Other Day/30 Days Discharge Instructions: May shower and wash wound with dial antibacterial soap and water prior to dressing change. Cleanser: Wound Cleanser Every Other Day/30 Days Discharge Instructions: Cleanse the wound with wound cleanser prior to applying a clean dressing using gauze sponges, not tissue or cotton balls. Prim Dressing: Promogran Prisma Matrix, 4.34 (sq in) (silver collagen) Every Other Day/30 Days ary Discharge Instructions: Moisten collagen with  saline or hydrogel Secondary Dressing: ABD Pad, 5x9 Every Other Day/30 Days Discharge Instructions: Apply over primary dressing as directed. Secondary Dressing: Woven Gauze Sponge, Non-Sterile 4x4 in Every Other Day/30 Days Discharge Instructions: Apply over primary dressing as directed. Secured With: Elastic Bandage 4 inch (ACE bandage) Every Other Day/30 Days LAILIE, CHOPLIN ([id number]) [id number].pdf Page 5 of 11 Discharge Instructions: Secure with ACE bandage as directed. Secured With: [employer], 4.5x3.1 (in/yd) Every Other Day/30 Days Discharge Instructions: Secure with Kerlix as directed. Wound #2 - Gluteal fold Wound Laterality: Left Cleanser: Soap and Water 1 x Per Day/30 Days Discharge Instructions: May shower and wash wound with dial antibacterial soap and water prior to dressing change. Cleanser: Wound Cleanser 1 x Per Day/30 Days Discharge Instructions: Cleanse the wound with wound cleanser prior to applying a clean dressing using gauze sponges, not tissue or cotton balls. Prim Dressing: KerraCel Ag Gelling Fiber Dressing, 4x5 in (silver alginate) 1 x Per Day/30 Days ary Discharge Instructions: Apply silver alginate to wound bed as instructed Secondary Dressing: ABD Pad, 5x9 1 x Per Day/30 Days Discharge Instructions: Apply over primary dressing as directed. Secondary Dressing: Woven Gauze Sponge, Non-Sterile 4x4 in 1 x Per Day/30 Days Discharge Instructions: Apply over primary dressing as directed. Consults Dermatology - For treatment of Hidradenitis Suppurativa on Left and Right Gluteus - (ICD10 [id number] - Non-pressure chronic ulcer of buttock with fat layer exposed) Electronic Signature(s) Signed: 08/10/2022 4:25:46 PM By: Duanne Guess MD FACS Signed: 08/10/2022 5:29:32 PM By: Karie Schwalbe RN Previous Signature: 08/10/2022 4:22:54 PM Version By: Duanne Guess MD FACS Entered By: Karie Schwalbe on 08/10/2022  16:24:12 Prescription 08/10/2022 -------------------------------------------------------------------------------- Kizzie Ide R. Duanne Guess MD Patient Name: Provider: Sep 08, 1976 [id number] Date of Birth: NPI#: [id number] Sex: DEA #: [phone] Phone #: License #: Eligha Bridegroom [facility] Patient Address: [address] [place], [place] [address] [address] [place], [place] [address] [phone] Allergies adhesive tape Provider's Orders Dermatology - ICD10: [phone] - For treatment of Hidradenitis Suppurativa on Left and Right Gluteus Hand Signature: Date(s): Electronic Signature(s) Signed: 08/10/2022 4:25:31 PM By: Duanne Guess MD FACS Entered By: Duanne Guess on 08/10/2022 16:25:30 MARINNA, CHORNEY R ([id number]) [id number] Page 6 of 11 -------------------------------------------------------------------------------- Problem List Details Patient Name: Date of Service: REBER, JUDY 08/10/2022 3:15 PM Medical Record Number: [id number]  Patient Account Number: [id number] Date of Birth/Sex: Treating RN: 02-Dec-1975 (46 y.o. F) Primary Care Provider: Juluis Mire Other Clinician: Referring Provider: Treating Provider/Extender: Patrici Ranks in Treatment: 11 Active Problems ICD-10 Encounter Code Description Active Date MDM Diagnosis L97.522 Non-pressure chronic ulcer of other part of left foot with fat layer exposed 05/24/2022 No Yes L98.412 Non-pressure chronic ulcer of buttock with fat layer exposed 05/24/2022 No Yes N18.9 Chronic kidney disease, unspecified 05/24/2022 No Yes [id number] Alcoholic hepatitis without ascites 05/24/2022 No Yes L73.2 Hidradenitis suppurativa 08/10/2022 No Yes Inactive Problems Resolved Problems Electronic Signature(s) Signed: 08/10/2022 4:16:50 PM By: Fredirick Maudlin MD FACS Entered By: Fredirick Maudlin on  08/10/2022 16:16:49 -------------------------------------------------------------------------------- Progress Note Details Patient Name: Date of Service: Lori Hawking R. 08/10/2022 3:15 PM Medical Record Number: [id number] Patient Account Number: [id number] Date of Birth/Sex: Treating RN: 1975-10-12 (46 y.o. F) Primary Care Provider: Juluis Mire Other Clinician: Referring Provider: Treating Provider/Extender: Patrici Ranks in Treatment: 11 Subjective Chief Complaint Information obtained from Patient Patient seen for complaints of Non-Healing Wounds. History of Present Illness (HPI) ADMISSION HIFZA, SCHWARK R ([id number]) [id number] Page 7 of 11 05/24/2022 This is a 46 year old woman with a past medical history significant for hypertension, alcoholic hepatitis, chronic anemia, chronic kidney disease, and peptic ulcer disease. She was admitted to the hospital in the middle of July for hypovolemic shock secondary to dehydration from gastroenteritis. She was found on admission to have an ulcer on her left buttock within the confines of the natal cleft and approaching the anal verge. The etiology was felt to be moisture and friction rather than pressure, as the patient is completely ambulatory. No debridement was performed. She was readmitted at the beginning of August with worsening lower extremity edema and fatigue. The wound was basically unchanged. Wound nursing was consulted and they recommended using silver alginate. She was discharged from the hospital with referral to the wound care center. This past weekend, she noted a wound on her dorsal left foot that she thinks was secondary to friction from shoes. She is here for evaluation of both these wounds. ABI in clinic today was 1.18. She reports that she has quit drinking but she continues to smoke. On the dorsal surface of her left foot between her fourth and fifth  metatarsal heads, there is a circular wound exposing the fat layer. There is some slough and eschar accumulation. It does appear consistent with her history of abrasion. On the medial aspect of her left buttock extending into the natal cleft and towards the anal verge. There is a large ulcer with a very clean surface. There is substantial undermining at the cranial portion of the wound. No purulent drainage or malodor. 06/08/2022: The patient returns today with significant worsening of her dorsal foot wound. Apparently she saw her PCP yesterday who was concerned for cellulitis but did not prescribe any antibiotics. She was also sent for a DVT scan which was negative. There is heavy slough as well as liquefactive necrosis of the fat layer. There is a bulla between the second and third toes that looks as though it may rupture in the near future. The wound undermines under the skin towards this bulla. No frankly purulent drainage or any odor. Her gluteal wound is very clean but is relatively unchanged in size. She has been managing her wounds on her own and finds it difficult to pack the silver alginate into the undermined portion of the buttock wound. She says that she has been  putting peroxide on her foot. 06/19/2022: The patient was not seen as a wound care visit last week because she arrived too late to her appointment. She did have a nursing visit, however. The culture that I took on 14 September grew out a polymicrobial population including Bacteroides fragilis Citrobacter Morganella Escherichia coli Enterococcus faecalis Pseudomonas aeruginosa and many others. I prescribed Bactrim and Augmentin for this polymicrobial infection based upon the resistance genes detected. Keystone topical compounded antibiotic was also ordered and she has been using that on her foot as well. Unfortunately, the wound on her dorsal foot continues to deteriorate. There is a larger area of undermining and more necrotic  tissue, including muscle, on the wound surface. No significant malodor or frank pus. Her gluteal wound, on the other hand, looks quite good. It is smaller and very clean and the undermined portion has contracted substantially. 06/27/2022: Last week after our visit, she went to the hospital where she was admitted with sepsis. She underwent surgical debridement of her dorsal foot wound. Cultures taken intraoperatively were negative and a piece of PuraPly was sutured in place. She is currently taking Augmentin as prescribed on discharge. Her gluteal wound continues to contract. It is more superficial and there is even less undermining. She does have some biofilm accumulation on the surface. The dorsal foot wound is stained yellow from the Xeroform that was applied by podiatry. PuraPly is covering about a third of the wound. The rest of the wound surface is very clean. She did have quite a bit of drainage, but it was not purulent or malodorous. 07/05/2022: The wound on her buttocks continues to contract. It is very clean without any accumulation of slough or other debris. The PuraPly that was on her foot came off with her dressing. There are a couple of sutures remaining around the perimeter of the wound. There is slough buildup but no signs of necrosis. Granulation tissue is starting to form. 10/19; the patient has 2 wounds in the left buttock in the gluteal fold which was a pressure ulcer from a stay in hospital during the summer. She has a more recent wound on the left dorsal foot which was apparently complications of infection. Both wound surfaces look healthy. She is using silver alginate on the left foot 07/24/2022: I have not seen her in 2 weeks and both wounds have contracted considerably since then. The dorsal foot wound has come in by a centimeter in width since her last visit. The gluteal wound is quite clean; there is a bit of slough buildup on the dorsal foot wound. 07/31/2022: Both wounds are  smaller again today. There is some dry eschar around the perimeter of the dorsal foot wound. There is senescent skin that has accumulated around the perimeter of the gluteal wound. No concern for infection. 08/10/2022: The wounds continue to improve. The dorsal foot wound is nearly closed with just a linear opening and some light eschar. The gluteal wound has a little slough on the surface. She has some newly opened hidradenitis lesions, however. In the past, I had noted her scars, but she had not shown any signs of active disease. She does report that she has had previous areas open in her armpits and groin area, similar to those in her natal cleft. Patient History Information obtained from Patient. Family History Cancer - Mother, Diabetes - Mother, Hypertension - Mother,Maternal Grandparents,Paternal Grandparents, No family history of Heart Disease, Hereditary Spherocytosis, Kidney Disease, Lung Disease, Seizures, Stroke, Thyroid Problems, Tuberculosis. Social History  Current every day smoker - .5 pack a day, Marital Status - Single, Alcohol Use - Never, Drug Use - No History, Caffeine Use - Daily. Medical History Hematologic/Lymphatic Patient has history of Anemia, Lymphedema Cardiovascular Patient has history of Hypertension, Hypotension Gastrointestinal Patient has history of Cirrhosis Endocrine Denies history of Type I Diabetes, Type II Diabetes Hospitalization/Surgery History - esophagogastroduodenoscopy. - biopsy. - cholecystectomy. - breast biopsy. - abdominoplasty. - tummy tuck. - hernia repair. - roux-en-y procedure. - tumor removal. - wrist ganglion incision. Medical A Surgical History Notes nd Gastrointestinal slow GI blood loss, alcoholic hepatitis Genitourinary CKD stage III Psychiatric depression DEANIE, FARIES R ([id number]) [id number] Page 8 of 11 Objective Constitutional No acute distress.. Vitals Time Taken: 3:53 PM, Height: 66 in,  Weight: 197 lbs, BMI: 31.8, Temperature: 98.2 F, Pulse: 98 bpm, Respiratory Rate: 18 breaths/min, Blood Pressure: 108/67 mmHg. Respiratory Normal work of breathing on room air.. General Notes: 08/10/2022: The wounds continue to improve. The dorsal foot wound is nearly closed with just a linear opening and some light eschar. The gluteal wound has a little slough on the surface. She has some newly opened hidradenitis lesions, however. Integumentary (Hair, Skin) Wound #1 status is Open. Original cause of wound was Shear/Friction. The date acquired was: 05/20/2022. The wound has been in treatment 11 weeks. The wound is located on the Left,Dorsal Foot. The wound measures 0.3cm length x 1.3cm width x 0.1cm depth; 0.306cm^2 area and 0.031cm^3 volume. There is Fat Layer (Subcutaneous Tissue) exposed. There is a medium amount of serosanguineous drainage noted. The wound margin is distinct with the outline attached to the wound base. There is large (67-100%) red granulation within the wound bed. There is no necrotic tissue within the wound bed. The periwound skin appearance had no abnormalities noted for texture. The periwound skin appearance had no abnormalities noted for moisture. The periwound skin appearance had no abnormalities noted for color. Periwound temperature was noted as No Abnormality. The periwound has tenderness on palpation. Wound #2 status is Open. Original cause of wound was Shear/Friction. The date acquired was: 03/20/2022. The wound has been in treatment 11 weeks. The wound is located on the Left Gluteal fold. The wound measures 4.5cm length x 4cm width x 0.1cm depth; 14.137cm^2 area and 1.414cm^3 volume. There is Fat Layer (Subcutaneous Tissue) exposed. There is a medium amount of serosanguineous drainage noted. The wound margin is distinct with the outline attached to the wound base. There is large (67-100%) red, pink granulation within the wound bed. There is no necrotic tissue within  the wound bed. The periwound skin appearance had no abnormalities noted for texture. The periwound skin appearance had no abnormalities noted for moisture. The periwound skin appearance had no abnormalities noted for color. Periwound temperature was noted as No Abnormality. The periwound has tenderness on palpation. 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 There was a Selective/Open Wound Non-Viable Tissue Debridement with a total area of 0.39 sq cm performed by Fredirick Maudlin, MD. With the following instrument(s): Curette to remove Non-Viable tissue/material. Material removed includes Eschar after achieving pain control using Lidocaine 4% T opical Solution. No specimens were taken. A time out was conducted at 16:10, prior to the start of the procedure. A Minimum  amount of bleeding was controlled with Pressure. The procedure was tolerated well with a pain level of 0 throughout and a pain level of 0 following the procedure. Post Debridement Measurements: 0.3cm length x 1.3cm width x 0.1cm depth; 0.031cm^3 volume. Character of Wound/Ulcer Post Debridement is improved. Post procedure Diagnosis Wound #1: Same as Pre-Procedure General Notes: Scribed for Dr. Lady Gary by J.Scotton. Wound #2 Pre-procedure diagnosis of Wound #2 is a Trauma, Other located on the Left Gluteal fold . There was a Selective/Open Wound Non-Viable Tissue Debridement with a total area of 18 sq cm performed by Duanne Guess, MD. With the following instrument(s): Curette to remove Non-Viable tissue/material. Material removed includes [facility] after achieving pain control using Lidocaine 4%  Topical Solution. No specimens were taken. A time out was conducted at 16:10, prior to the start of the procedure. A Minimum amount of bleeding was controlled with Pressure. The procedure was tolerated well with a pain level of 0 throughout and a pain level of 0 following the procedure. Post Debridement Measurements: 4.5cm length x 4cm width x 0.1cm depth; 1.414cm^3 volume. Character of Wound/Ulcer Post Debridement is improved. Post procedure Diagnosis Wound #2: Same as Pre-Procedure General Notes: Scribed for Dr. Lady Gary by J.Scotton. Plan The following medication(s) was prescribed: clindamycin phosphate topical 1 % gel Apply thin layer twice daily to affected area 08/10/2022: The wounds continue to improve. The dorsal foot wound is nearly closed with just a linear opening and some light eschar. The gluteal wound has a STEPANIE, MASSIAH R ([id number]) [id number] Page 9 of 11 little slough on the surface. She has some newly opened hidradenitis lesions, however. I used a curette to debride eschar off of the dorsal foot wound and slough off of the gluteal wound. We will continue Prisma silver collagen to the foot wound and silver alginate to the gluteal wound. I have also prescribed topical clindamycin for her to apply to the area affected by hidradenitis suppurativa. She does not tolerate doxycycline. We will also place a referral to dermatology for further evaluation and management of her disease. She will follow-up in 2 weeks. Electronic Signature(s) Signed: 08/10/2022 4:24:40 PM By: Duanne Guess MD FACS Entered By: Duanne Guess on 08/10/2022 16:24:40 -------------------------------------------------------------------------------- HxROS Details Patient Name: Date of Service: [name] 08/10/2022 3:15 PM Medical Record Number: [id number] Patient Account Number: [id number] Date of Birth/Sex: Treating RN: 06-09-1976 (46 y.o. F) Primary Care Provider:  Gwinda Passe Other Clinician: Referring Provider: Treating Provider/Extender: Sharyl Nimrod in Treatment: 11 Information Obtained From Patient Hematologic/Lymphatic Medical History: Positive for: Anemia; Lymphedema Cardiovascular Medical History: Positive for: Hypertension; Hypotension Gastrointestinal Medical History: Positive for: Cirrhosis Past Medical History Notes: slow GI blood loss, alcoholic hepatitis Endocrine Medical History: Negative for: Type I Diabetes; Type II Diabetes Genitourinary Medical History: Past Medical History Notes: CKD stage III Psychiatric Medical History: Past Medical History Notes: depression Immunizations Pneumococcal Vaccine: Received Pneumococcal Vaccination: No Implantable Devices None Hospitalization / Surgery History Type of Hospitalization/Surgery esophagogastroduodenoscopy RAKAYLA, WALLENHORST R ([id number]) [id number] Page 10 of 11 biopsy cholecystectomy breast biopsy abdominoplasty tummy tuck hernia repair roux-en-y procedure tumor removal wrist ganglion incision Family and Social History Cancer: Yes - Mother; Diabetes: Yes - Mother; Heart Disease: No; Hereditary Spherocytosis: No; Hypertension: Yes - Mother,Maternal Grandparents,Paternal Grandparents; Kidney Disease: No; Lung Disease: No; Seizures: No; Stroke: No; Thyroid Problems: No; Tuberculosis: No; Current every day smoker - .5 pack a day; Marital Status - Single; Alcohol Use: Never; Drug Use: No History; Caffeine Use: Daily; Financial Concerns:  Yes; Food, Clothing or Shelter Needs: No; Support System Lacking: No; Transportation Concerns: No Electronic Signature(s) Signed: 08/10/2022 4:25:46 PM By: Fredirick Maudlin MD FACS Entered By: Fredirick Maudlin on 08/10/2022 16:19:23 -------------------------------------------------------------------------------- SuperBill Details Patient Name: Date of Service: Lori Hawking R. 08/10/2022 Medical Record Number: [id number] Patient Account Number: [id number] Date of Birth/Sex: Treating RN: 1976/01/07 (46 y.o. F) Primary Care Provider: Juluis Mire Other Clinician: Referring Provider: Treating Provider/Extender: Patrici Ranks in Treatment: 11 Diagnosis Coding ICD-10 Codes Code Description [phone] Non-pressure chronic ulcer of other part of left foot with fat layer exposed L98.412 Non-pressure chronic ulcer of buttock with fat layer exposed N18.9 Chronic kidney disease, unspecified [id number] Alcoholic hepatitis without ascites L73.2 Hidradenitis suppurativa Facility Procedures : CPT4 Code: [id number] Description: 7597 - DEBRIDE WOUND 1ST 20 SQ CM OR < ICD-10 Diagnosis Description L97.522 Non-pressure chronic ulcer of other part of left foot with fat layer exposed L98.412 Non-pressure chronic ulcer of buttock with fat layer exposed Modifier: Quantity: 1 Physician Procedures : CPT4 Code Description Modifier [id number] - WC PHYS LEVEL 4 - EST PT 25 ICD-10 Diagnosis Description L97.522 Non-pressure chronic ulcer of other part of left foot with fat layer exposed L98.412 Non-pressure chronic ulcer of buttock with fat layer  exposed L73.2 Hidradenitis suppurativa N18.9 Chronic kidney disease, unspecified Quantity: 1 Electronic Signature(s) Signed: 08/10/2022 4:25:01 PM By: Fredirick Maudlin MD FACS Entered By: Fredirick Maudlin on 08/10/2022 16:25:01

## 2022-08-14 ENCOUNTER — Telehealth: Payer: Self-pay

## 2022-08-14 ENCOUNTER — Other Ambulatory Visit: Payer: Self-pay | Admitting: *Deleted

## 2022-08-14 ENCOUNTER — Encounter: Payer: Self-pay | Admitting: Gastroenterology

## 2022-08-14 ENCOUNTER — Other Ambulatory Visit (INDEPENDENT_AMBULATORY_CARE_PROVIDER_SITE_OTHER): Payer: Self-pay

## 2022-08-14 ENCOUNTER — Other Ambulatory Visit: Payer: Self-pay

## 2022-08-14 ENCOUNTER — Ambulatory Visit (AMBULATORY_SURGERY_CENTER): Payer: Self-pay | Admitting: Gastroenterology

## 2022-08-14 VITALS — BP 113/77 | HR 99 | Temp 98.0°F | Resp 17 | Ht 66.0 in | Wt 184.0 lb

## 2022-08-14 DIAGNOSIS — R7989 Other specified abnormal findings of blood chemistry: Secondary | ICD-10-CM

## 2022-08-14 DIAGNOSIS — K626 Ulcer of anus and rectum: Secondary | ICD-10-CM

## 2022-08-14 DIAGNOSIS — K259 Gastric ulcer, unspecified as acute or chronic, without hemorrhage or perforation: Secondary | ICD-10-CM

## 2022-08-14 DIAGNOSIS — D509 Iron deficiency anemia, unspecified: Secondary | ICD-10-CM

## 2022-08-14 DIAGNOSIS — R772 Abnormality of alphafetoprotein: Secondary | ICD-10-CM

## 2022-08-14 DIAGNOSIS — K260 Acute duodenal ulcer with hemorrhage: Secondary | ICD-10-CM

## 2022-08-14 DIAGNOSIS — K7030 Alcoholic cirrhosis of liver without ascites: Secondary | ICD-10-CM

## 2022-08-14 DIAGNOSIS — I8500 Esophageal varices without bleeding: Secondary | ICD-10-CM

## 2022-08-14 DIAGNOSIS — Z538 Procedure and treatment not carried out for other reasons: Secondary | ICD-10-CM

## 2022-08-14 LAB — CBC WITH DIFFERENTIAL/PLATELET
Basophils Absolute: 0.2 10*3/uL — ABNORMAL HIGH (ref 0.0–0.1)
Basophils Relative: 1.8 % (ref 0.0–3.0)
Eosinophils Absolute: 0.1 10*3/uL (ref 0.0–0.7)
Eosinophils Relative: 0.7 % (ref 0.0–5.0)
HCT: 32.9 % — ABNORMAL LOW (ref 36.0–46.0)
Hemoglobin: 11.2 g/dL — ABNORMAL LOW (ref 12.0–15.0)
Lymphocytes Relative: 18.8 % (ref 12.0–46.0)
Lymphs Abs: 2 10*3/uL (ref 0.7–4.0)
MCHC: 33.9 g/dL (ref 30.0–36.0)
MCV: 99.4 fl (ref 78.0–100.0)
Monocytes Absolute: 0.7 10*3/uL (ref 0.1–1.0)
Monocytes Relative: 6.6 % (ref 3.0–12.0)
Neutro Abs: 7.5 10*3/uL (ref 1.4–7.7)
Neutrophils Relative %: 72.1 % (ref 43.0–77.0)
Platelets: 149 10*3/uL — ABNORMAL LOW (ref 150.0–400.0)
RBC: 3.31 Mil/uL — ABNORMAL LOW (ref 3.87–5.11)
RDW: 14.4 % (ref 11.5–15.5)
WBC: 10.4 10*3/uL (ref 4.0–10.5)

## 2022-08-14 MED ORDER — SUCRALFATE 1 G PO TABS: 1.0000 g | ORAL_TABLET | Freq: Two times a day (BID) | ORAL | 0 refills | Status: DC

## 2022-08-14 MED ORDER — SODIUM CHLORIDE 0.9 % IV SOLN: 500.0000 mL | Freq: Once | INTRAVENOUS | Status: DC

## 2022-08-14 NOTE — Progress Notes (Signed)
Pt's states no medical or surgical changes since previsit or office visit. 

## 2022-08-14 NOTE — Telephone Encounter (Signed)
EGD instructions given

## 2022-08-14 NOTE — Patient Instructions (Signed)
YOU HAD AN ENDOSCOPIC PROCEDURE TODAY AT THE [facility]:   Refer to the procedure report that was given to you for any specific questions about what was found during the examination.  If the procedure report does not answer your questions, please call your gastroenterologist to clarify.  If you requested that your care partner not be given the details of your procedure findings, then the procedure report has been included in a sealed envelope for you to review at your convenience later.  YOU SHOULD EXPECT: Some feelings of bloating in the abdomen. Passage of more gas than usual.  Walking can help get rid of the air that was put into your GI tract during the procedure and reduce the bloating. If you had a lower endoscopy (such as a colonoscopy or flexible sigmoidoscopy) you may notice spotting of blood in your stool or on the toilet paper. If you underwent a bowel prep for your procedure, you may not have a normal bowel movement for a few days.  Please Note:  You might notice some irritation and congestion in your nose or some drainage.  This is from the oxygen used during your procedure.  There is no need for concern and it should clear up in a day or so.  SYMPTOMS TO REPORT IMMEDIATELY:  Following lower endoscopy (colonoscopy or flexible sigmoidoscopy):  Excessive amounts of blood in the stool  Significant tenderness or worsening of abdominal pains  Swelling of the abdomen that is new, acute  Fever of 100F or higher  Following upper endoscopy (EGD)  Vomiting of blood or coffee ground material  New chest pain or pain under the shoulder blades  Painful or persistently difficult swallowing  New shortness of breath  Fever of 100F or higher  Black, tarry-looking stools  For urgent or emergent issues, a gastroenterologist can be reached at any hour by calling [phone]. Do not use MyChart messaging for urgent concerns.    DIET:  We do recommend a small meal at first, but  then you may proceed to your regular diet.  Drink plenty of fluids but you should avoid alcoholic beverages for 24 hours.  ACTIVITY:  You should plan to take it easy for the rest of today and you should NOT DRIVE or use heavy machinery until tomorrow (because of the sedation medicines used during the test).    FOLLOW UP: Our staff will call the number listed on your records the next business day following your procedure.  We will call around 7:15- 8:00 am to check on you and address any questions or concerns that you may have regarding the information given to you following your procedure. If we do not reach you, we will leave a message.     If any biopsies were taken you will be contacted by phone or by letter within the next 1-3 weeks.  Please call us at [phone] if you have not heard about the biopsies in 3 weeks.    SIGNATURES/CONFIDENTIALITY: You and/or your care partner have signed paperwork which will be entered into your electronic medical record.  These signatures attest to the fact that that the information above on your After Visit Summary has been reviewed and is understood.  Full responsibility of the confidentiality of this discharge information lies with you and/or your care-partner.  

## 2022-08-14 NOTE — [facility] Notes (Signed)
1437 Robinul 0.1 mg IV given due large amount of secretions upon assessment.  MD made aware, vss  

## 2022-08-14 NOTE — Op Note (Signed)
[facility] Patient Name: [name] Procedure Date: 08/14/2022 2:26 PM MRN: [id number] Endoscopist: Lynann Bologna , MD, [id number] Age: 46 Referring MD:  Date of Birth: 1976/04/05 Gender: Female Account #: [id number] Procedure:                Colonoscopy Indications:              IDA Medicines:                Monitored Anesthesia Care Procedure:                Pre-Anesthesia Assessment:                           - Prior to the procedure, a History and Physical                            was performed, and patient medications and                            allergies were reviewed. The patient's tolerance of                            previous anesthesia was also reviewed. The risks                            and benefits of the procedure and the sedation                            options and risks were discussed with the patient.                            All questions were answered, and informed consent                            was obtained. Prior Anticoagulants: The patient has                            taken no anticoagulant or antiplatelet agents. ASA                            Grade Assessment: III - A patient with severe                            systemic disease. After reviewing the risks and                            benefits, the patient was deemed in satisfactory                            condition to undergo the procedure.                           After obtaining informed consent, the colonoscope  was passed under direct vision. Throughout the                            procedure, the patient's blood pressure, pulse, and                            oxygen saturations were monitored continuously. The                            [id number]-HQ190L Colonoscope was introduced through the                            anus with the intention of advancing to the cecum.                            The scope was advanced to the descending colon                             before the procedure was aborted. Medications were                            given. The colonoscopy was performed with                            difficulty due to inadequate bowel prep. The                            patient tolerated the procedure well. The quality                            of the bowel preparation was inadequate. Scope In: 2:57:07 PM Scope Out: 3:04:52 PM Total Procedure Duration: 0 hours 7 minutes 45 seconds  Findings:                 The procedure was aborted due to very poor                            preparation. Rectal exam did not reveal sacral                            large decubitus ulcer. She has been under care of                            wound management. Complications:            No immediate complications. Estimated Blood Loss:     Estimated blood loss: none. Impression:               - Preparation of the colon was inadequate.                           - The entire examined colon is normal.                           - No specimens collected.  Recommendation:           - Patient has a contact number available for                            emergencies. The signs and symptoms of potential                            delayed complications were discussed with the                            patient. Return to normal activities tomorrow.                            Written discharge instructions were provided to the                            patient.                           - Resume previous diet.                           - Continue present medications.                           - Recommend repeating colonoscopy once sacral ulcer                            has completely healed, with a 2-day prep.                           - The findings and recommendations were discussed                            with the patient. Lynann Bologna, MD 08/14/2022 3:19:45 PM This report has been signed electronically.

## 2022-08-14 NOTE — Op Note (Signed)
[facility] Patient Name: [name] Procedure Date: 08/14/2022 2:28 PM MRN: [id number] Endoscopist: Lynann Bologna , MD, [id number] Age: 46 Referring MD:  Date of Birth: Sep 01, 1976 Gender: Female Account #: [id number] Procedure:                Upper GI endoscopy Indications:              Iron deficiency anemia secondary to chronic blood                            loss. H/O marginal ulcers on EGD 03/2022. H/O RYGB x                            2 (2010, then revision 2014 d/t marginal ulcers) Medicines:                Monitored Anesthesia Care Procedure:                Pre-Anesthesia Assessment:                           - Prior to the procedure, a History and Physical                            was performed, and patient medications and                            allergies were reviewed. The patient's tolerance of                            previous anesthesia was also reviewed. The risks                            and benefits of the procedure and the sedation                            options and risks were discussed with the patient.                            All questions were answered, and informed consent                            was obtained. Prior Anticoagulants: The patient has                            taken no anticoagulant or antiplatelet agents. ASA                            Grade Assessment: III - A patient with severe                            systemic disease. After reviewing the risks and                            benefits, the patient was deemed in satisfactory  condition to undergo the procedure.                           After obtaining informed consent, the endoscope was                            passed under direct vision. Throughout the                            procedure, the patient's blood pressure, pulse, and                            oxygen saturations were monitored continuously. The                             GIF [id number] #[id number] was introduced through the                            mouth, and advanced to the jejunum. The upper GI                            endoscopy was accomplished without difficulty. The                            patient tolerated the procedure well. Scope In: Scope Out: Findings:                 Grade 0-I varices were found in the lower third of                            the esophagus. Too small for EVL.                           Typically Roux-en-Y gastric anatomy. Retained                            adherent food in the stomach did limit exam. One                            oozing cratered 6 mm gastric ulcer was found at the                            GJ anastomosis (one small surgical clip was also                            noted). 2 endoscopic hemostatic clips were placed                            with good results. There was no bleeding at the end                            of the procedure.  The examined jejunum was normal. Complications:            No immediate complications. Estimated Blood Loss:     Estimated blood loss: none. Impression:               - Grade 0-I esophageal varices. Too small for EVL.                           - Oozing GJ anastomotic ulcer. Clips (MR                            conditional) were placed.                           - Normal examined jejunum.                           - No specimens collected. Recommendation:           - Patient has a contact number available for                            emergencies. The signs and symptoms of potential                            delayed complications were discussed with the                            patient. Return to normal activities tomorrow.                            Written discharge instructions were provided to the                            patient.                           - Resume previous diet.                           - Continue present medications  including Protonix                            40 mg p.o. twice daily.                           - Start Carafate 1 g twice daily x 2 weeks (lower                            dose due to CKD)                           - The findings and recommendations were discussed                            with the patient (pt is HIPPA)                           -  Check CBC today and trend CBC.                           - Avoid all nonsteroidals.                           - Recommend repeat EGD in 12 weeks to ensure                            healing of the ulcers.                           - If there is any active bleeding or problems like                            dizziness, she needs to go directly to ED Lynann Bologna, MD 08/14/2022 3:16:09 PM This report has been signed electronically.

## 2022-08-14 NOTE — [facility] Notes (Signed)
1440 HR > 100 with esmolol 25 mg given IV, MD updated, vss

## 2022-08-14 NOTE — Progress Notes (Signed)
Chief Complaint: FU  Referring Provider:  Kerin Perna, NP      ASSESSMENT AND PLAN;   #1. Marginal ulcer on EGD 03/2022. 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  Being followed by hematology.  #3. 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 No ascites or HE. No EV on EGD 03/2022.  Plan: -rpt EGD/colon with miralax -Protonix 58m po BID to continue for now -Low salt, normal protein diet -Appt with Dawn Drazek.  -Check CBC, CMP, INR, autoimmune hepatitis panel (AMA, ASMA), ceruloplasmin, A1AT, celiac screen, AFP and GGT. -Check anti-HAV total Ab and HBsAb.  If neg, would recommend vaccination for hepatitis A and B.    HPI:    Lori GODINGis a 46y.o. female  With HTN, IDA, obesity S/P RYGB x 2 (2010, then revision 2014 d/t marginal ulcers) with recurrent marginal ulcers, ETOH hepatitis (req prednisolone) with early liver cirrhosis, thrombocytopenia, CKD.  No ETOH since June 2023.  For FU Nausea with occ vomiting. No heartburn. The abdominal pain is much better. No fever chills or night sweats. She has been eating small but more frequently.  Jaundice has almost completely resolved. No itching or history of easy bruisability.  [facility]still in process of getting appt.  Liver WU:    Latest Ref Rng & Units 07/24/2022    4:38 PM 07/12/2022   11:23 AM 07/03/2022    3:50 PM  Hepatic Function  Total Protein 6.0 - 8.3 g/dL 7.6  7.8  7.9   Albumin 3.5 - 5.2 g/dL 2.5  2.6    AST 0 - 37 U/L 48  54  66   ALT 0 - 35 U/L _0 Alk Phosphatase 39 - 117 U/L 165  167    Total Bilirubin 0.2 - 1.2 mg/dL 2.5  3.3  2.6    MELD 3.0: 23 at 07/24/2022  4:38 PM MELD-Na: 21 at 07/24/2022  4:38 PM Calculated from: Serum Creatinine: 1.44 mg/dL at 07/24/2022  4:38  PM Serum Sodium: 133 mEq/L at 07/24/2022  4:38 PM Total Bilirubin: 2.5 mg/dL at 07/24/2022  4:38 PM Serum Albumin: 2.5 g/dL at 07/24/2022  4:38 PM INR(ratio): 1.6 ratio at 07/24/2022  4:38 PM Age at listing (hypothetical): 46 years Sex: Female at 07/24/2022  4:38 PM  Elevated ferritin: 687 with iron saturation of 34%-thought to be due to alcoholic liver disease. Elevated LDH at 200 Negative ANA Negative acute hepatitis panel October 2023     Latest Ref Rng & Units 07/24/2022    4:38 PM 07/12/2022   11:23 AM 07/03/2022    3:50 PM  CBC  WBC 4.0 - 10.5 K/uL 8.4  6.3  7.2   Hemoglobin 12.0 - 15.0 g/dL 9.8  10.7  11.0   Hematocrit 36.0 - 46.0 % 29.0  31.9  30.3   Platelets 150.0 - 400.0 K/uL 87.0 Repeated and verified X2.  101  117       Past GI procedures  EGD 04/18/2022 - Esophageal plaques were found, suspicious for candidiasis. - Small hiatal hernia. - Roux-en-Y gastrojejunostomy with moderate marginal ulcer. Biopsied. - Treated with Diflucan, Protonix twice daily, Carafate  Patient never had colonoscopy.  [place] 1. Hepatic steatosis. Mildly nodular liver contour, concerning for cirrhosis. 2. Trace perihepatic ascites.  CT AP without  contrast 04/10/2022 1. Severe fatty liver. 2. Artifact versus less likely mild acute pancreatitis. Correlation with pancreatic enzymes recommended. 3. No bowel obstruction. Normal appendix. 4. Aortic Atherosclerosis (ICD10-I70.0).  Multiple previous EGDs.  Twice 2011 (multiple small ulcers at jejunal side of pouch with normal gastric pouch.  Twice in 2012 (anastomotic ulcer), once 2013 (anastomotic ulcer 15 mm sized), twice 2014, once 2017 (report not found) . Latest EGD was 09/2018 (only path reprt found: Gastric mucosa with no diagnostic abnormality, no H. 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  Past Medical History:  Diagnosis Date   Acid reflux    Anemia    Ulcer     Past Surgical  History:  Procedure Laterality Date   ABDOMINAL SURGERY     abdominoplasty   BIOPSY  04/18/2022   Procedure: BIOPSY;  Surgeon: Jackquline Denmark, MD;  Location: [facility] ENDOSCOPY;  Service: Gastroenterology;;   BREAST BIOPSY Right 2013   CHOLECYSTECTOMY N/A 11/23/2019   Procedure: LAPAROSCOPIC CHOLECYSTECTOMY WITH INTRAOPERATIVE CHOLANGIOGRAM;  Surgeon: Mickeal Skinner, MD;  Location: [place];  Service: General;  Laterality: N/A;   COSMETIC SURGERY     tummy tuck   ESOPHAGOGASTRODUODENOSCOPY (EGD) WITH PROPOFOL N/A 04/18/2022   Procedure: ESOPHAGOGASTRODUODENOSCOPY (EGD) WITH PROPOFOL;  Surgeon: Jackquline Denmark, MD;  Location: [place];  Service: Gastroenterology;  Laterality: N/A;   FOOT SURGERY     HERNIA REPAIR     I & D EXTREMITY Left 06/21/2022   Procedure: IRRIGATION AND DEBRIDEMENT EXTREMITY Debridement of Left Foot/APPLICATION OF GRAFT;  Surgeon: Yevonne Pax, DPM;  Location: [facility];  Service: Podiatry;  Laterality: Left;   ROUX-EN-Y PROCEDURE     TUMOR REMOVAL     WRIST GANGLION EXCISION      Family History  Problem Relation Age of Onset   Cancer Mother        kidney   Cancer Paternal Aunt    Breast cancer Maternal Aunt    Pancreatic cancer Maternal Aunt    Lung cancer Paternal Uncle     Social History   Tobacco Use   Smoking status: Every Day    Packs/day: 0.25    Years: 25.00    Total pack years: 6.25    Types: Cigarettes    Passive exposure: Current   Smokeless tobacco: Never  Vaping Use   Vaping Use: Never used  Substance Use Topics   Alcohol use: Not Currently    Comment: previously consumed 1-2 glasses of wine per night   Drug use: No    Current Outpatient Medications  Medication Sig Dispense Refill   busPIRone (BUSPAR) 15 MG tablet Take 15 mg by mouth daily as needed (anxiety).     Cyanocobalamin (B-12 PO) Take 1 tablet by mouth daily.     ferrous sulfate 325 (65 FE) MG tablet Take 1 tablet (325 mg total) by mouth daily with breakfast. 90  tablet 1   folic acid (FOLVITE) 1 MG tablet Take 1 tablet (1 mg total) by mouth daily. 30 tablet 6   furosemide (LASIX) 20 MG tablet Take 1 tablet (20 mg total) by mouth daily. 30 tablet 11   lactulose (CHRONULAC) 10 GM/15ML solution Take 30 mLs (20 g total) by mouth 3 (three) times daily as needed for mild constipation or moderate constipation. 473 mL 1   loperamide (IMODIUM) 2 MG capsule Take 1 capsule (2 mg total) by mouth as needed for diarrhea or loose stools. 30 capsule 0  Multiple Vitamin (MULTIVITAMIN) tablet Take 1 tablet by mouth daily.     nicotine polacrilex (NICORETTE) 2 MG gum Take 1 each (2 mg total) by mouth as needed for smoking cessation. 100 tablet 0   ondansetron (ZOFRAN) 8 MG tablet Take 1 tablet (8 mg total) by mouth every 8 (eight) hours as needed. 30 tablet 0   pantoprazole (PROTONIX) 40 MG tablet Take 1 tablet (40 mg total) by mouth 2 (two) times daily before a meal. 60 tablet 1   sertraline (ZOLOFT) 50 MG tablet Take 50 mg by mouth daily.     spironolactone (ALDACTONE) 50 MG tablet TAKE 1 TABLET BY MOUTH EVERY DAY 90 tablet 1   Current Facility-Administered Medications  Medication Dose Route Frequency Provider Last Rate Last Admin   0.9 %  sodium chloride infusion  500 mL Intravenous Once Jackquline Denmark, MD        Allergies  Allergen Reactions   Tape Rash    Clear tape    Review of Systems:  Constitutional: Denies fever, chills, diaphoresis, appetite change and fatigue.  HEENT: Denies photophobia, eye pain, redness, hearing loss, ear pain, congestion, sore throat, rhinorrhea, sneezing, mouth sores, neck pain, neck stiffness and tinnitus.   Respiratory: Denies SOB, DOE, cough, chest tightness,  and wheezing.   Cardiovascular: Denies chest pain, palpitations and leg swelling.  Genitourinary: Denies dysuria, urgency, frequency, hematuria, flank pain and difficulty urinating.  Musculoskeletal: Denies myalgias, back pain, joint swelling, arthralgias and gait problem.   Skin: No rash.  Neurological: Denies dizziness, seizures, syncope, weakness, light-headedness, numbness and headaches.  Hematological: Denies adenopathy. Easy bruising, personal or family bleeding history  Psychiatric/Behavioral: No anxiety or depression     Physical Exam:    BP 116/68   Pulse (!) 116   Temp 98 F (36.7 C)   Ht _0  (1.676 m)   Wt 184 lb (83.5 kg)   SpO2 100%   BMI 29.70 kg/m  Wt Readings from Last 3 Encounters:  08/14/22 184 lb (83.5 kg)  07/19/22 185 lb (83.9 kg)  07/18/22 184 lb 4 oz (83.6 kg)   Constitutional:  Well-developed, in no acute distress. Psychiatric: Normal mood and affect. Behavior is normal. HEENT: Pupils normal.  Conjunctivae are normal. No scleral icterus. Neck supple.  Cardiovascular: Normal rate, regular rhythm. No edema Pulmonary/chest: Effort normal and breath sounds normal. No wheezing, rales or rhonchi. Abdominal: Soft, nondistended. Nontender. Bowel sounds active throughout. There are no masses palpable. No hepatomegaly. Rectal: Deferred Neurological: Alert and oriented to person place and time. Skin: Skin is warm and dry. No rashes noted.  Data Reviewed: I have personally reviewed following labs and imaging studies  CBC:    Latest Ref Rng & Units 07/24/2022    4:38 PM 07/12/2022   11:23 AM 07/03/2022    3:50 PM  CBC  WBC 4.0 - 10.5 K/uL 8.4  6.3  7.2   Hemoglobin 12.0 - 15.0 g/dL 9.8  10.7  11.0   Hematocrit 36.0 - 46.0 % 29.0  31.9  30.3   Platelets 150.0 - 400.0 K/uL 87.0 Repeated and verified X2.  101  117     CMP:    Latest Ref Rng & Units 07/24/2022    4:38 PM 07/12/2022   11:23 AM 07/03/2022    3:50 PM  CMP  Glucose 70 - 99 mg/dL 86  88  84   BUN 6 - 23 mg/dL _1 Creatinine 0.40 - 1.20 mg/dL 1.44  1.33  1.21   Sodium 135 - 145 mEq/L 133  136  138   Potassium 3.5 - 5.1 mEq/L 4.1  3.7  3.3   Chloride 96 - 112 mEq/L 102  107  109   CO2 19 - 32 mEq/L _0 Calcium 8.4 - 10.5 mg/dL 8.9  9.0  8.9    Total Protein 6.0 - 8.3 g/dL 7.6  7.8  7.9   Total Bilirubin 0.2 - 1.2 mg/dL 2.5  3.3  2.6   Alkaline Phos 39 - 117 U/L 165  167    AST 0 - 37 U/L 48  54  66   ALT 0 - 35 U/L _1 Carmell Austria, MD 08/14/2022, 2:19 PM  Cc: Kerin Perna, NP

## 2022-08-14 NOTE — Progress Notes (Signed)
12 week repeat upper scheduled for 11/08/21 at 4:00.  6 month Recall for repeat colonoscopy placed # [id number]. Needs repeat colonoscopy once sacral ulcer is healed.

## 2022-08-14 NOTE — Progress Notes (Signed)
Report given to PACU, vss 

## 2022-08-15 ENCOUNTER — Other Ambulatory Visit: Payer: Self-pay

## 2022-08-15 ENCOUNTER — Telehealth: Payer: Self-pay | Admitting: *Deleted

## 2022-08-15 ENCOUNTER — Telehealth: Payer: Self-pay

## 2022-08-15 NOTE — Addendum Note (Signed)
Addended by: Rosine Door on: 08/15/2022 10:26 AM   Modules accepted: Orders

## 2022-08-15 NOTE — Telephone Encounter (Signed)
No answer on  follow up call. Left message.   

## 2022-08-15 NOTE — Telephone Encounter (Signed)
Pt was contacted in regard to recommendations from Dr. Chales Abrahams after Colonoscopy: Recommend repeating colonoscopy once sacral ulcer has completely healed, with a 2-day prep: Pt was notified. Pt instructed to call our office once sacral ulcer has healed. Pt verbalized understanding with all questions answered.

## 2022-08-22 LAB — TIQ- AMBIGUOUS ORDER

## 2022-08-22 LAB — EXTRA URINE SPECIMEN

## 2022-08-22 LAB — COPPER, URINE, 24 HOUR: Total Volume: 505

## 2022-08-24 ENCOUNTER — Encounter (HOSPITAL_BASED_OUTPATIENT_CLINIC_OR_DEPARTMENT_OTHER): Payer: Self-pay | Admitting: General Surgery

## 2022-08-25 NOTE — Progress Notes (Signed)
VARSHINI, ARRANTS Chambers ([id number]) [id number] Page 1 of 8 Visit Report for 08/24/2022 Arrival Information Details Patient Name: Date of Service: [name], [name] 08/24/2022 3:15 PM Medical Record Number: [id number] Patient Account Number: [id number] Date of Birth/Sex: Treating RN: 10/14/1975 (46 y.o. [name], [name] Primary Care Yakov Bergen: Juluis Mire Other Clinician: Referring Mima Cranmore: Treating Teren Zurcher/Extender: Patrici Ranks in Treatment: 13 Visit Information History Since Last Visit Added or deleted any medications: No Patient Arrived: Ambulatory Any new allergies or adverse reactions: No Arrival Time: 15:33 Had a fall or experienced change in No Accompanied By: self activities of daily living that may affect Transfer Assistance: None risk of falls: Patient Identification Verified: Yes Signs or symptoms of abuse/neglect since last visito No Secondary Verification Process Completed: Yes Hospitalized since last visit: No Patient Requires Transmission-Based Precautions: No Implantable device outside of the clinic excluding No Patient Has Alerts: No cellular tissue based products placed in the center since last visit: Has Dressing in Place as Prescribed: Yes Pain Present Now: No Electronic Signature(s) Signed: 08/24/2022 5:21:53 PM By: Adline Peals Entered By: Adline Peals on 08/24/2022 15:34:22 -------------------------------------------------------------------------------- Encounter Discharge Information Details Patient Name: Date of Service: Lori Hawking Chambers. 08/24/2022 3:15 PM Medical Record Number: [id number] Patient Account Number: [id number] Date of Birth/Sex: Treating RN: 1976/06/17 (46 y.o. [name] Primary Care Petrea Fredenburg: Juluis Mire Other Clinician: Referring Amarisa Wilinski: Treating Jaylaa Gallion/Extender: Patrici Ranks in Treatment: 13 Encounter  Discharge Information Items Post Procedure Vitals Discharge Condition: Stable Temperature (F): 98.5 Ambulatory Status: Ambulatory Pulse (bpm): 109 Discharge Destination: Home Respiratory Rate (breaths/min): 18 Transportation: Private Auto Blood Pressure (mmHg): 109/72 Accompanied By: self Schedule Follow-up Appointment: No Clinical Summary of Care: Electronic Signature(s) Signed: 08/24/2022 5:21:53 PM By: Adline Peals Entered By: Adline Peals on 08/24/2022 16:04:50 [name] ([id number]) [id number] Page 2 of 8 -------------------------------------------------------------------------------- Lower Extremity Assessment Details Patient Name: Date of Service: [name], [name] 08/24/2022 3:15 PM Medical Record Number: [id number] Patient Account Number: [id number] Date of Birth/Sex: Treating RN: Oct 08, 1975 (46 y.o. [name] Primary Care Mitsue Peery: Juluis Mire Other Clinician: Referring Virgen Belland: Treating Barbra Miner/Extender: Patrici Ranks in Treatment: 13 Edema Assessment Assessed: [Left: No] [Right: No] Edema: [Left: Ye] [Right: s] Calf Left: Right: Point of Measurement: From Medial Instep 40 cm Ankle Left: Right: Point of Measurement: From Medial Instep 31.5 cm Vascular Assessment Pulses: Dorsalis Pedis Palpable: [Left:Yes] Electronic Signature(s) Signed: 08/24/2022 5:21:53 PM By: Adline Peals Entered By: Adline Peals on 08/24/2022 15:39:01 -------------------------------------------------------------------------------- Multi Wound Chart Details Patient Name: Date of Service: Lori Hawking Chambers. 08/24/2022 3:15 PM Medical Record Number: [id number] Patient Account Number: [id number] Date of Birth/Sex: Treating RN: 1975-10-09 (46 y.o. F) Primary Care Shea Swalley: Juluis Mire Other Clinician: Referring Briella Hobday: Treating Kallen Mccrystal/Extender: Patrici Ranks in Treatment: 13 Vital Signs Height(in): 66 Pulse(bpm): 109 Weight(lbs): 197 Blood Pressure(mmHg): 109/72 Body Mass Index(BMI): 31.8 Temperature(F): 98.5 Respiratory Rate(breaths/min): 18 [1:Photos:] [N/A:N/A [id number] Page 3 of 8] Left, Dorsal Foot Left Gluteal fold N/A Wound Location: Shear/Friction Shear/Friction N/A Wounding Event: Trauma, Other Trauma, Other N/A Primary Etiology: Anemia, Lymphedema, Hypertension, Anemia, Lymphedema, Hypertension, N/A Comorbid History: Hypotension, Cirrhosis Hypotension, Cirrhosis 05/20/2022 03/20/2022 N/A Date Acquired: 13 13 N/A Weeks of Treatment: Healed - Epithelialized Open N/A Wound Status: No No N/A Wound Recurrence: 0x0x0 4x4x0.5 N/A Measurements L x W x D (cm) 0 12.566 N/A A (cm) : rea 0 6.283 N/A Volume (cm) : 100.00% 50.30% N/A % Reduction in A rea: 100.00%  80.90% N/A % Reduction in Volume: 11 Starting Position 1 (o'clock): 1 Ending Position 1 (o'clock): 0.5 Maximum Distance 1 (cm): No Yes N/A Undermining: Full Thickness Without Exposed Full Thickness Without Exposed N/A Classification: Support Structures Support Structures None Present Medium N/A Exudate A mount: N/A Serosanguineous N/A Exudate Type: N/A red, brown N/A Exudate Color: Distinct, outline attached Distinct, outline attached N/A Wound Margin: Large (67-100%) Large (67-100%) N/A Granulation A mount: Red Red N/A Granulation Quality: None Present (0%) None Present (0%) N/A Necrotic A mount: Fascia: No Fat Layer (Subcutaneous Tissue): Yes N/A Exposed Structures: Fat Layer (Subcutaneous Tissue): No Fascia: No Tendon: No Tendon: No Muscle: No Muscle: No Joint: No Joint: No Bone: No Bone: No Small (1-33%) Small (1-33%) N/A Epithelialization: N/A Debridement - Selective/Open Wound N/A Debridement: Pre-procedure Verification/Time Out N/A 15:51 N/A Taken: N/A Lidocaine 5% topical ointment  N/A Pain Control: N/A Non-Viable Tissue N/A Level: N/A 16 N/A Debridement A (sq cm): rea N/A Curette N/A Instrument: N/A Minimum N/A Bleeding: N/A Pressure N/A Hemostasis A chieved: N/A Procedure was tolerated well N/A Debridement Treatment Response: N/A 4x4x0.5 N/A Post Debridement Measurements L x W x D (cm) N/A 6.283 N/A Post Debridement Volume: (cm) No Abnormalities Noted No Abnormalities Noted N/A Periwound Skin Texture: No Abnormalities Noted No Abnormalities Noted N/A Periwound Skin Moisture: No Abnormalities Noted No Abnormalities Noted N/A Periwound Skin Color: No Abnormality No Abnormality N/A Temperature: Yes Yes N/A Tenderness on Palpation: N/A Debridement N/A Procedures Performed: Treatment Notes Electronic Signature(s) Signed: 08/24/2022 3:55:48 PM By: Fredirick Maudlin MD FACS Entered By: Fredirick Maudlin on 08/24/2022 15:55:47 -------------------------------------------------------------------------------- Multi-Disciplinary Care Plan Details Patient Name: Date of Service: Lori Hawking Chambers. 08/24/2022 3:15 PM Medical Record Number: [id number] Patient Account Number: [id number] Date of Birth/Sex: Treating RN: 22-Jul-1976 (46 y.o. [name] Primary Care Provider: Juluis Mire Other Clinician: Referring Provider: Treating Provider/Extender: Patrici Ranks in Treatment: Dolores reviewed with physician ZORAH, BACKES ([id number]) [id number] Page 4 of 8 Active Inactive Pressure Nursing Diagnoses: Knowledge deficit related to causes and risk factors for pressure ulcer development Potential for impaired tissue integrity related to pressure, friction, moisture, and shear Goals: Patient will remain free from development of additional pressure ulcers Date Initiated: 05/24/2022 Date Inactivated: 06/27/2022 Target Resolution Date: 06/30/2022 Goal Status:  Met Patient/caregiver will verbalize risk factors for pressure ulcer development Date Initiated: 05/24/2022 Date Inactivated: 06/27/2022 Target Resolution Date: 06/30/2022 Goal Status: Met Patient/caregiver will verbalize understanding of pressure ulcer management Date Initiated: 06/27/2022 Target Resolution Date: 11/23/2022 Goal Status: Active Interventions: Assess: immobility, friction, shearing, incontinence upon admission and as needed Assess potential for pressure ulcer upon admission and as needed Notes: Wound/Skin Impairment Nursing Diagnoses: Impaired tissue integrity Knowledge deficit related to ulceration/compromised skin integrity Goals: Patient will demonstrate a reduced rate of smoking or cessation of smoking Date Initiated: 05/24/2022 Target Resolution Date: 11/23/2022 Goal Status: Active Patient/caregiver will verbalize understanding of skin care regimen Date Initiated: 05/24/2022 Target Resolution Date: 11/23/2022 Goal Status: Active Ulcer/skin breakdown will have a volume reduction of 50% by week 8 Date Initiated: 06/27/2022 Date Inactivated: 07/24/2022 Target Resolution Date: 07/25/2022 Goal Status: Unmet Unmet Reason: infection in foot Ulcer/skin breakdown will have a volume reduction of 80% by week 12 Date Initiated: 07/24/2022 Target Resolution Date: 11/23/2022 Goal Status: Active Interventions: Assess patient/caregiver ability to obtain necessary supplies Assess ulceration(s) every visit Treatment Activities: Skin care regimen initiated : 05/24/2022 Topical wound management initiated : 05/24/2022 Notes: Electronic Signature(s) Signed: 08/24/2022 5:21:53 PM By:  Adline Peals Entered By: Adline Peals on 08/24/2022 15:56:53 -------------------------------------------------------------------------------- Pain Assessment Details Patient Name: Date of Service: [name], [name] 08/24/2022 3:15 PM Medical Record Number: [id number] Patient Account  Number: [id number] Date of Birth/Sex: Treating RN: 10-23-1975 (46 y.o. [name] Primary Care Zailah Zagami: Juluis Mire Other Clinician: Referring Kristeena Meineke: Treating Lillyanne Bradburn/Extender: Patrici Ranks in Treatment: [address] [place] ([id number]) [id number] Page 5 of 8 Active Problems Location of Pain Severity and Description of Pain Patient Has Paino No Site Locations Rate the pain. Current Pain Level: 0 Pain Management and Medication Current Pain Management: Electronic Signature(s) Signed: 08/24/2022 5:21:53 PM By: Adline Peals Entered By: Adline Peals on 08/24/2022 15:34:41 -------------------------------------------------------------------------------- Patient/Caregiver Education Details Patient Name: Date of Service: [name] 11/30/2023andnbsp3:15 PM Medical Record Number: [id number] Patient Account Number: [id number] Date of Birth/Gender: Treating RN: 1976/03/22 (46 y.o. [name] Primary Care Physician: Juluis Mire Other Clinician: Referring Physician: Treating Physician/Extender: Patrici Ranks in Treatment: 13 Education Assessment Education Provided To: Patient Education Topics Provided Wound/Skin Impairment: Methods: Explain/Verbal Responses: Reinforcements needed, State content correctly Electronic Signature(s) Signed: 08/24/2022 5:21:53 PM By: Adline Peals Entered By: Adline Peals on 08/24/2022 15:57:05 [name] ([id number]) [id number] Page 6 of 8 -------------------------------------------------------------------------------- Wound Assessment Details Patient Name: Date of Service: [name], [name] 08/24/2022 3:15 PM Medical Record Number: [id number] Patient Account Number: [id number] Date of Birth/Sex: Treating RN: 1976/04/18 (46 y.o. [name] Primary Care  Aedyn Mckeon: Juluis Mire Other Clinician: Referring Adisyn Ruscitti: Treating Jerrico Covello/Extender: Patrici Ranks in Treatment: 13 Wound Status Wound Number: 1 Primary Etiology: Trauma, Other Wound Location: Left, Dorsal Foot Wound Status: Healed - Epithelialized Wounding Event: Shear/Friction Comorbid Anemia, Lymphedema, Hypertension, Hypotension, History: Cirrhosis Date Acquired: 05/20/2022 Weeks Of Treatment: 13 Clustered Wound: No Photos Wound Measurements Length: (cm) Width: (cm) Depth: (cm) Area: (cm) Volume: (cm) 0 % Reduction in Area: 100% 0 % Reduction in Volume: 100% 0 Epithelialization: Small (1-33%) 0 Tunneling: No 0 Undermining: No Wound Description Classification: Full Thickness Without Exposed Suppor Wound Margin: Distinct, outline attached Exudate Amount: None Present t Structures Foul Odor After Cleansing: No Slough/Fibrino No Wound Bed Granulation Amount: Large (67-100%) Exposed Structure Granulation Quality: Red Fascia Exposed: No Necrotic Amount: None Present (0%) Fat Layer (Subcutaneous Tissue) Exposed: No Tendon Exposed: No Muscle Exposed: No Joint Exposed: No Bone Exposed: No Periwound Skin Texture Texture Color No Abnormalities Noted: Yes No Abnormalities Noted: Yes Moisture Temperature / Pain No Abnormalities Noted: Yes Temperature: No Abnormality Tenderness on Palpation: Yes Treatment Notes Wound #1 (Foot) Wound Laterality: Dorsal, Left Cleanser Peri-Wound Care Topical Primary Dressing Secondary Dressing Secured With OFFIE, PICKRON ([id number]) [id number] Page 7 of 8 Compression Wrap Compression Stockings Add-Ons Electronic Signature(s) Signed: 08/24/2022 5:21:53 PM By: Adline Peals Entered By: Adline Peals on 08/24/2022 15:53:30 -------------------------------------------------------------------------------- Wound Assessment Details Patient Name: Date of  Service: [name], [name] 08/24/2022 3:15 PM Medical Record Number: [id number] Patient Account Number: [id number] Date of Birth/Sex: Treating RN: [birth year] (46 y.o. Iver Nestle, Jamie Primary Care Akeen Ledyard: Juluis Mire Other Clinician: Referring Admire Bunnell: Treating Zayanna Pundt/Extender: Patrici Ranks in Treatment: 13 Wound Status Wound Number: 2 Primary Etiology: Trauma, Other Wound Location: Left Gluteal fold Wound Status: Open Wounding Event: Shear/Friction Comorbid Anemia, Lymphedema, Hypertension, Hypotension, History: Cirrhosis Date Acquired: 03/20/2022 Weeks Of Treatment: 13 Clustered Wound: No Photos Wound Measurements Length: (cm) 4 Width: (cm) 4 Depth: (cm) 0.5 Area: (cm) 12.566 Volume: (cm) 6.283 % Reduction in Area: 50.3% %  Reduction in Volume: 80.9% Epithelialization: Small (1-33%) Tunneling: No Undermining: Yes Starting Position (o'clock): 11 Ending Position (o'clock): 1 Maximum Distance: (cm) 0.5 Wound Description Classification: Full Thickness Without Exposed Support Structures Wound Margin: Distinct, outline attached Exudate Amount: Medium Exudate Type: Serosanguineous Exudate Color: red, brown Foul Odor After Cleansing: No Slough/Fibrino No Wound Bed Granulation Amount: Large (67-100%) Exposed Structure Granulation Quality: Red Fascia Exposed: No Necrotic Amount: None Present (0%) Fat Layer (Subcutaneous Tissue) Exposed: Yes Tendon Exposed: No Muscle Exposed: No Joint Exposed: No Bone Exposed: No [name], [name] ([id number]) [id number] Page 8 of 8 Periwound Skin Texture Texture Color No Abnormalities Noted: Yes No Abnormalities Noted: Yes Moisture Temperature / Pain No Abnormalities Noted: Yes Temperature: No Abnormality Tenderness on Palpation: Yes Treatment Notes Wound #2 (Gluteal fold) Wound Laterality: Left Cleanser Soap and Water Discharge Instruction: May shower and wash  wound with dial antibacterial soap and water prior to dressing change. Wound Cleanser Discharge Instruction: Cleanse the wound with wound cleanser prior to applying a clean dressing using gauze sponges, not tissue or cotton balls. Peri-Wound Care Topical Primary Dressing KerraCel Ag Gelling Fiber Dressing, 4x5 in (silver alginate) Discharge Instruction: Apply silver alginate to wound bed as instructed Secondary Dressing ABD Pad, 5x9 Discharge Instruction: Apply over primary dressing as directed. Woven Gauze Sponge, Non-Sterile 4x4 in Discharge Instruction: Apply over primary dressing as directed. Secured With Compression Wrap Compression Stockings Environmental education officer) Signed: 08/24/2022 7:35:29 PM By: Blanche East RN Entered By: Blanche East on 08/24/2022 15:44:18 -------------------------------------------------------------------------------- Vitals Details Patient Name: Date of Service: Lori Hawking Chambers. 08/24/2022 3:15 PM Medical Record Number: [id number] Patient Account Number: [id number] Date of Birth/Sex: Treating RN: 1976-02-16 (46 y.o. [name] Primary Care Davi Kroon: Juluis Mire Other Clinician: Referring Luella Gardenhire: Treating Dominyk Law/Extender: Patrici Ranks in Treatment: 13 Vital Signs Time Taken: 15:34 Temperature (F): 98.5 Height (in): 66 Pulse (bpm): 109 Weight (lbs): 197 Respiratory Rate (breaths/min): 18 Body Mass Index (BMI): 31.8 Blood Pressure (mmHg): 109/72 Reference Range: 80 - 120 mg / dl Electronic Signature(s) Signed: 08/24/2022 5:21:53 PM By: Adline Peals Entered By: Adline Peals on 08/24/2022 15:34:36

## 2022-08-25 NOTE — Progress Notes (Signed)
JENILLE, LASZLO R ([id number]) [id number] Page 1 of 9 Visit Report for 08/24/2022 Chief Complaint Document Details Patient Name: Date of Service: [name], [name] 08/24/2022 3:15 PM Medical Record Number: [id number] Patient Account Number: [id number] Date of Birth/Sex: Treating RN: [birth year] (46 y.o. F) Primary Care Provider: Gwinda Passe Other Clinician: Referring Provider: Treating Provider/Extender: Sharyl Nimrod in Treatment: 13 Information Obtained from: Patient Chief Complaint Patient seen for complaints of Non-Healing Wounds. Electronic Signature(s) Signed: 08/24/2022 3:56:46 PM By: Duanne Guess MD FACS Entered By: Duanne Guess on 08/24/2022 15:56:46 -------------------------------------------------------------------------------- Debridement Details Patient Name: Date of Service: [name] 08/24/2022 3:15 PM Medical Record Number: [id number] Patient Account Number: [id number] Date of Birth/Sex: Treating RN: Dec 31, 1975 (46 y.o. Fredderick Phenix Primary Care Provider: Gwinda Passe Other Clinician: Referring Provider: Treating Provider/Extender: Sharyl Nimrod in Treatment: 13 Debridement Performed for Assessment: Wound #2 Left Gluteal fold Performed By: Physician Duanne Guess, MD Debridement Type: Debridement Level of Consciousness (Pre-procedure): Awake and Alert Pre-procedure Verification/Time Out Yes - 15:51 Taken: Start Time: 15:52 Pain Control: Lidocaine 5% topical ointment T Area Debrided (L x W): otal 4 (cm) x 4 (cm) = 16 (cm) Tissue and other material debrided: Non-Viable, Biofilm Level: Non-Viable Tissue Debridement Description: Selective/Open Wound Instrument: Curette Bleeding: Minimum Hemostasis Achieved: Pressure Response to Treatment: Procedure was tolerated well Level of Consciousness (Post- Awake and Alert procedure): Post  Debridement Measurements of Total Wound Length: (cm) 4 Width: (cm) 4 Depth: (cm) 0.5 Volume: (cm) 6.283 Character of Wound/Ulcer Post Debridement: Requires Further Debridement Post Procedure Diagnosis Same as Pre-procedure JENNYLEE, UEHARA R ([id number]) [phone].[id number] Page 2 of 9 Notes Scribed for Dr. Lady Gary by Tommie Ard, RN Electronic Signature(s) Signed: 08/24/2022 5:21:53 PM By: Samuella Bruin Signed: 08/25/2022 7:41:18 AM By: Duanne Guess MD FACS Entered By: Samuella Bruin on 08/24/2022 15:52:58 -------------------------------------------------------------------------------- HPI Details Patient Name: Date of Service: [name] 08/24/2022 3:15 PM Medical Record Number: [id number] Patient Account Number: [id number] Date of Birth/Sex: Treating RN: [birth year] (46 y.o. F) Primary Care Provider: Gwinda Passe Other Clinician: Referring Provider: Treating Provider/Extender: Sharyl Nimrod in Treatment: 13 History of Present Illness HPI Description: ADMISSION 05/24/2022 This is a 46 year old woman with a past medical history significant for hypertension, alcoholic hepatitis, chronic anemia, chronic kidney disease, and peptic ulcer disease. She was admitted to the hospital in the middle of July for hypovolemic shock secondary to dehydration from gastroenteritis. She was found on admission to have an ulcer on her left buttock within the confines of the natal cleft and approaching the anal verge. The etiology was felt to be moisture and friction rather than pressure, as the patient is completely ambulatory. No debridement was performed. She was readmitted at the beginning of August with worsening lower extremity edema and fatigue. The wound was basically unchanged. Wound nursing was consulted and they recommended using silver alginate. She was discharged from the hospital with referral to the wound care center.  This past weekend, she noted a wound on her dorsal left foot that she thinks was secondary to friction from shoes. She is here for evaluation of both these wounds. ABI in clinic today was 1.18. She reports that she has quit drinking but she continues to smoke. On the dorsal surface of her left foot between her fourth and fifth metatarsal heads, there is a circular wound exposing the fat layer. There is some slough and eschar accumulation. It does appear consistent with her history of abrasion. On the  medial aspect of her left buttock extending into the natal cleft and towards the anal verge. There is a large ulcer with a very clean surface. There is substantial undermining at the cranial portion of the wound. No purulent drainage or malodor. 06/08/2022: The patient returns today with significant worsening of her dorsal foot wound. Apparently she saw her PCP yesterday who was concerned for cellulitis but did not prescribe any antibiotics. She was also sent for a DVT scan which was negative. There is heavy slough as well as liquefactive necrosis of the fat layer. There is a bulla between the second and third toes that looks as though it may rupture in the near future. The wound undermines under the skin towards this bulla. No frankly purulent drainage or any odor. Her gluteal wound is very clean but is relatively unchanged in size. She has been managing her wounds on her own and finds it difficult to pack the silver alginate into the undermined portion of the buttock wound. She says that she has been putting peroxide on her foot. 06/19/2022: The patient was not seen as a wound care visit last week because she arrived too late to her appointment. She did have a nursing visit, however. The culture that I took on 14 September grew out a polymicrobial population including Bacteroides fragilis Citrobacter Morganella Escherichia coli Enterococcus faecalis Pseudomonas aeruginosa and many others. I prescribed  Bactrim and Augmentin for this polymicrobial infection based upon the resistance genes detected. Keystone topical compounded antibiotic was also ordered and she has been using that on her foot as well. Unfortunately, the wound on her dorsal foot continues to deteriorate. There is a larger area of undermining and more necrotic tissue, including muscle, on the wound surface. No significant malodor or frank pus. Her gluteal wound, on the other hand, looks quite good. It is smaller and very clean and the undermined portion has contracted substantially. 06/27/2022: Last week after our visit, she went to the hospital where she was admitted with sepsis. She underwent surgical debridement of her dorsal foot wound. Cultures taken intraoperatively were negative and a piece of PuraPly was sutured in place. She is currently taking Augmentin as prescribed on discharge. Her gluteal wound continues to contract. It is more superficial and there is even less undermining. She does have some biofilm accumulation on the surface. The dorsal foot wound is stained yellow from the Xeroform that was applied by podiatry. PuraPly is covering about a third of the wound. The rest of the wound surface is very clean. She did have quite a bit of drainage, but it was not purulent or malodorous. 07/05/2022: The wound on her buttocks continues to contract. It is very clean without any accumulation of slough or other debris. The PuraPly that was on her foot came off with her dressing. There are a couple of sutures remaining around the perimeter of the wound. There is slough buildup but no signs of necrosis. Granulation tissue is starting to form. 10/19; the patient has 2 wounds in the left buttock in the gluteal fold which was a pressure ulcer from a stay in hospital during the summer. She has a more recent wound on the left dorsal foot which was apparently complications of infection. Both wound surfaces look healthy. She is using silver  alginate on the left foot 07/24/2022: I have not seen her in 2 weeks and both wounds have contracted considerably since then. The dorsal foot wound has come in by a centimeter in width since her  last visit. The gluteal wound is quite clean; there is a bit of slough buildup on the dorsal foot wound. 07/31/2022: Both wounds are smaller again today. There is some dry eschar around the perimeter of the dorsal foot wound. There is senescent skin that has accumulated around the perimeter of the gluteal wound. No concern for infection. 08/10/2022: The wounds continue to improve. The dorsal foot wound is nearly closed with just a linear opening and some light eschar. The gluteal wound has a little slough on the surface. She has some newly opened hidradenitis lesions, however. In the past, I had noted her scars, but she had not shown any signs of active disease. She does report that she has had previous areas open in her armpits and groin area, similar to those in her natal cleft. 08/24/2022: Her dorsal foot wound is healed. Her gluteal cleft wound is quite a bit smaller with just a light layer of slough on the surface. Unfortunately, for some reason her pharmacy did not give her the Cleocin lotion that I prescribed when she went to pick up prescriptions yesterday. She still has open and active hidradenitis lesions on her buttocks. JANEE, URESTE R ([id number]) [id number] Page 3 of 9 Electronic Signature(s) Signed: 08/24/2022 3:57:49 PM By: Duanne Guess MD FACS Entered By: Duanne Guess on 08/24/2022 15:57:49 -------------------------------------------------------------------------------- Physical Exam Details Patient Name: Date of Service: Fredderick Severance 08/24/2022 3:15 PM Medical Record Number: [id number] Patient Account Number: [id number] Date of Birth/Sex: Treating RN: 1976-08-19 (46 y.o. F) Primary Care Provider: Gwinda Passe Other Clinician: Referring  Provider: Treating Provider/Extender: Sharyl Nimrod in Treatment: 13 Constitutional . Slightly tachycardic, asymptomatic.. . . No acute distress. Respiratory Normal work of breathing on room air. Notes 08/24/2022: Her dorsal foot wound is healed. Her gluteal cleft wound is quite a bit smaller with just a light layer of slough on the surface. Electronic Signature(s) Signed: 08/24/2022 3:59:13 PM By: Duanne Guess MD FACS Entered By: Duanne Guess on 08/24/2022 15:59:13 -------------------------------------------------------------------------------- Physician Orders Details Patient Name: Date of Service: [name] 08/24/2022 3:15 PM Medical Record Number: [id number] Patient Account Number: [id number] Date of Birth/Sex: Treating RN: 1976/01/26 (46 y.o. 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 - Dr. Lady Gary Room 4 Return A ***Pick up script for clindamycin phosphate topical ointment and hibiclens soap*** Bathing/ Shower/ Hygiene May shower and wash wound with soap and water. - with dressing changes Edema Control - Lymphedema / SCD / Other Elevate legs to the level of the heart or above for 30 minutes daily and/or when sitting, a frequency of: NOELENE, GANG R ([id number]) [id number] Page 4 of 9 Avoid standing for long periods of time. Patient to wear own compression stockings every  day. Wound Treatment Wound #2 - Gluteal fold Wound Laterality: Left Cleanser: Soap and Water 1 x Per Day/30 Days Discharge Instructions: May shower and wash wound with dial antibacterial soap and water prior to dressing change. Cleanser: Wound Cleanser 1 x Per Day/30 Days Discharge Instructions: Cleanse the wound with wound cleanser prior to applying a clean dressing using gauze sponges, not  tissue or cotton balls. Prim Dressing: KerraCel Ag Gelling Fiber Dressing, 4x5 in (silver alginate) 1 x Per Day/30 Days ary Discharge Instructions: Apply silver alginate to wound bed as instructed Secondary Dressing: ABD Pad, 5x9 1 x Per Day/30 Days Discharge Instructions: Apply over primary dressing as directed. Secondary Dressing: Woven Gauze Sponge, Non-Sterile 4x4 in 1 x Per Day/30 Days Discharge Instructions: Apply over primary dressing as directed. Patient Medications llergies: adhesive tape A Notifications Medication Indication Start End 08/24/2022 Cleocin T DOSE topical 1 % lotion - Apply lotion to affected area twice daily Electronic Signature(s) Signed: 08/25/2022 7:41:18 AM By: Duanne Guessannon, Roczen Waymire MD FACS Previous Signature: 08/24/2022 4:01:12 PM Version By: Duanne Guessannon, Izel Hochberg MD FACS Entered By: Duanne Guessannon, Breken Nazari on 08/24/2022 16:01:21 -------------------------------------------------------------------------------- Problem List Details Patient Name: Date of Service: Pollyann KennedyFEWELL, TA MISHA R. 08/24/2022 3:15 PM Medical Record Number: [id number] Patient Account Number: [id number] Date of Birth/Sex: Treating RN: 1976-04-06 (46 y.o. F) Primary Care Provider: Gwinda PasseEdwards, Michelle Other Clinician: Referring Provider: Treating Provider/Extender: Sharyl Nimrodannon, Kardell Virgil Edwards, Michelle Weeks in Treatment: 13 Active Problems ICD-10 Encounter Code Description Active Date MDM Diagnosis L97.522 Non-pressure chronic ulcer of other part of left foot with fat layer exposed 05/24/2022 No Yes L98.412 Non-pressure  chronic ulcer of buttock with fat layer exposed 05/24/2022 No Yes N18.9 Chronic kidney disease, unspecified 05/24/2022 No Yes K70.10 Alcoholic hepatitis without ascites 05/24/2022 No Yes L73.2 Hidradenitis suppurativa 08/10/2022 No Yes Kizzie IdeFEWELL, Dayleen R ([id number]) [id number] Page 5 of 9 Inactive Problems Resolved Problems Electronic Signature(s) Signed: 08/24/2022 3:55:39 PM By: Duanne Guessannon, Jaquavious Mercer MD FACS Entered By: Duanne Guessannon, Clarabell Matsuoka on 08/24/2022 15:55:39 -------------------------------------------------------------------------------- Progress Note Details Patient Name: Date of Service: Pollyann KennedyFEWELL, TA MISHA R. 08/24/2022 3:15 PM Medical Record Number: [id number] Patient Account Number: [id number] Date of Birth/Sex: Treating RN: 1976-04-06 (46 y.o. F) Primary Care Provider: Gwinda PasseEdwards, Michelle Other Clinician: Referring Provider: Treating Provider/Extender: Sharyl Nimrodannon, Axton Cihlar Edwards, Michelle Weeks in Treatment: 13 Subjective Chief Complaint Information obtained from Patient Patient seen for complaints of Non-Healing Wounds. History of Present Illness (HPI) ADMISSION 05/24/2022 This is a 46 year old woman with a past medical history significant for hypertension, alcoholic hepatitis, chronic anemia, chronic kidney disease, and peptic ulcer disease. She was admitted to the hospital in the middle of July for hypovolemic shock secondary to dehydration from gastroenteritis. She was found on admission to have an ulcer on her left buttock within the confines of the natal cleft and approaching the anal verge. The etiology was felt to be moisture and friction rather than pressure, as the patient is completely ambulatory. No debridement was performed. She was readmitted at the beginning of August with worsening lower extremity edema and fatigue. The wound was basically unchanged. Wound nursing was consulted and they recommended using silver alginate. She was discharged from  the hospital with referral to the wound care center. This past weekend, she noted a wound on her dorsal left foot that she thinks was secondary to friction from shoes. She is here for evaluation of both these wounds. ABI in clinic today was 1.18. She reports that she has quit drinking but she continues to smoke. On the dorsal surface of her left foot between her fourth and fifth metatarsal heads, there is a circular wound exposing the fat layer. There is some slough and eschar accumulation. It does appear consistent with her history of abrasion. On the medial aspect of her left buttock extending into the natal cleft and towards the anal verge. There is a large ulcer with a very clean surface. There is substantial undermining at  the cranial portion of the wound. No purulent drainage or malodor. 06/08/2022: The patient returns today with significant worsening of her dorsal foot wound. Apparently she saw her PCP yesterday who was concerned for cellulitis but did not prescribe any antibiotics. She was also sent for a DVT scan which was negative. There is heavy slough as well as liquefactive necrosis of the fat layer. There is a bulla between the second and third toes that looks as though it may rupture in the near future. The wound undermines under the skin towards this bulla. No frankly purulent drainage or any odor. Her gluteal wound is very clean but is relatively unchanged in size. She has been managing her wounds on her own and finds it difficult to pack the silver alginate into the undermined portion of the buttock wound. She says that she has been putting peroxide on her foot. 06/19/2022: The patient was not seen as a wound care visit last week because she arrived too late to her appointment. She did have a nursing visit, however. The culture that I took on 14 September grew out a polymicrobial population including Bacteroides fragilis Citrobacter Morganella Escherichia coli Enterococcus faecalis  Pseudomonas aeruginosa and many others. I prescribed Bactrim and Augmentin for this polymicrobial infection based upon the resistance genes detected. Keystone topical compounded antibiotic was also ordered and she has been using that on her foot as well. Unfortunately, the wound on her dorsal foot continues to deteriorate. There is a larger area of undermining and more necrotic tissue, including muscle, on the wound surface. No significant malodor or frank pus. Her gluteal wound, on the other hand, looks quite good. It is smaller and very clean and the undermined portion has contracted substantially. 06/27/2022: Last week after our visit, she went to the hospital where she was admitted with sepsis. She underwent surgical debridement of her dorsal foot wound. Cultures taken intraoperatively were negative and a piece of PuraPly was sutured in place. She is currently taking Augmentin as prescribed on discharge. Her gluteal wound continues to contract. It is more superficial and there is even less undermining. She does have some biofilm accumulation on the surface. The dorsal foot wound is stained yellow from the Xeroform that was applied by podiatry. PuraPly is covering about a third of the wound. The rest of the wound surface is very clean. She did have quite a bit of drainage, but it was not purulent or malodorous. 07/05/2022: The wound on her buttocks continues to contract. It is very clean without any accumulation of slough or other debris. The PuraPly that was on her foot came off with her dressing. There are a couple of sutures remaining around the perimeter of the wound. There is slough buildup but no signs of necrosis. Granulation tissue is starting to form. 10/19; the patient has 2 wounds in the left buttock in the gluteal fold which was a pressure ulcer from a stay in hospital during the summer. She has a more recent wound on the left dorsal foot which was apparently complications of infection.  Both wound surfaces look healthy. She is using silver alginate on the left foot 07/24/2022: I have not seen her in 2 weeks and both wounds have contracted considerably since then. The dorsal foot wound has come in by a centimeter in width since her last visit. The gluteal wound is quite clean; there is a bit of slough buildup on the dorsal foot wound. 07/31/2022: Both wounds are smaller again today. There is some dry  eschar around the perimeter of the dorsal foot wound. There is senescent skin that has DAWSON, HOLLMAN R ([id number]) [id number] Page 6 of 9 accumulated around the perimeter of the gluteal wound. No concern for infection. 08/10/2022: The wounds continue to improve. The dorsal foot wound is nearly closed with just a linear opening and some light eschar. The gluteal wound has a little slough on the surface. She has some newly opened hidradenitis lesions, however. In the past, I had noted her scars, but she had not shown any signs of active disease. She does report that she has had previous areas open in her armpits and groin area, similar to those in her natal cleft. 08/24/2022: Her dorsal foot wound is healed. Her gluteal cleft wound is quite a bit smaller with just a light layer of slough on the surface. Unfortunately, for some reason her pharmacy did not give her the Cleocin lotion that I prescribed when she went to pick up prescriptions yesterday. She still has open and active hidradenitis lesions on her buttocks. Patient History Information obtained from Patient. Family History Cancer - Mother, Diabetes - Mother, Hypertension - Mother,Maternal Grandparents,Paternal Grandparents, No family history of Heart Disease, Hereditary Spherocytosis, Kidney Disease, Lung Disease, Seizures, Stroke, Thyroid Problems, Tuberculosis. Social History Current every day smoker - .5 pack a day, Marital Status - Single, Alcohol Use - Never, Drug Use - No History, Caffeine Use  - Daily. Medical History Hematologic/Lymphatic Patient has history of Anemia, Lymphedema Cardiovascular Patient has history of Hypertension, Hypotension Gastrointestinal Patient has history of Cirrhosis Endocrine Denies history of Type I Diabetes, Type II Diabetes Hospitalization/Surgery History - esophagogastroduodenoscopy. - biopsy. - cholecystectomy. - breast biopsy. - abdominoplasty. - tummy tuck. - hernia repair. - roux-en-y procedure. - tumor removal. - wrist ganglion incision. Medical A Surgical History Notes nd Gastrointestinal slow GI blood loss, alcoholic hepatitis Genitourinary CKD stage III Psychiatric depression Objective Constitutional Slightly tachycardic, asymptomatic.Marland Kitchen No acute distress. Vitals Time Taken: 3:34 PM, Height: 66 in, Weight: 197 lbs, BMI: 31.8, Temperature: 98.5 F, Pulse: 109 bpm, Respiratory Rate: 18 breaths/min, Blood Pressure: 109/72 mmHg. Respiratory Normal work of breathing on room air. General Notes: 08/24/2022: Her dorsal foot wound is healed. Her gluteal cleft wound is quite a bit smaller with just a light layer of slough on the surface. Integumentary (Hair, Skin) Wound #1 status is Healed - Epithelialized. Original cause of wound was Shear/Friction. The date acquired was: 05/20/2022. The wound has been in treatment 13 weeks. The wound is located on the Left,Dorsal Foot. The wound measures 0cm length x 0cm width x 0cm depth; 0cm^2 area and 0cm^3 volume. There is no tunneling or undermining noted. There is a none present amount of drainage noted. The wound margin is distinct with the outline attached to the wound base. There is large (67-100%) red granulation within the wound bed. There is no necrotic tissue within the wound bed. The periwound skin appearance had no abnormalities noted for texture. The periwound skin appearance had no abnormalities noted for moisture. The periwound skin appearance had no abnormalities noted for color. Periwound  temperature was noted as No Abnormality. The periwound has tenderness on palpation. Wound #2 status is Open. Original cause of wound was Shear/Friction. The date acquired was: 03/20/2022. The wound has been in treatment 13 weeks. The wound is located on the Left Gluteal fold. The wound measures 4cm length x 4cm width x 0.5cm depth; 12.566cm^2 area and 6.283cm^3 volume. There is Fat Layer (Subcutaneous Tissue) exposed. There is no tunneling noted,  however, there is undermining starting at 11:00 and ending at 1:00 with a maximum distance of 0.5cm. There is a medium amount of serosanguineous drainage noted. The wound margin is distinct with the outline attached to the wound base. There is large (67-100%) red granulation within the wound bed. There is no necrotic tissue within the wound bed. The periwound skin appearance had no abnormalities noted for texture. The periwound skin appearance had no abnormalities noted for moisture. The periwound skin appearance had no abnormalities noted for color. Periwound temperature was noted as No Abnormality. The periwound has tenderness on palpation. 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 There was a Selective/Open Wound Non-Viable Tissue Debridement with a total area of 16 sq cm performed by Duanne Guess, MD. With the following instrument(s): Curette to remove Non-Viable tissue/material. Material removed includes Biofilm after achieving pain control using Lidocaine 5% topical ointment. No specimens were taken. A time out was conducted at  15:51, prior to the start of the procedure. A Minimum amount of bleeding was controlled with Pressure. The procedure was tolerated well. Post Debridement Measurements: 4cm length x 4cm width x 0.5cm depth; 6.283cm^3 volume. Character of Wound/Ulcer Post Debridement requires further debridement. Post procedure Diagnosis Wound #2: Same as Pre-Procedure General Notes: Scribed for Dr. Lady Gary by Tommie Ard, RN. Plan Follow-up Appointments: Return Appointment in 1 week. - Dr. Lady Gary Room 4 ***Pick up script for clindamycin phosphate topical ointment and hibiclens soap*** Bathing/ Shower/ Hygiene: May shower and wash wound with soap and water. - with dressing changes Edema Control - Lymphedema / SCD / Other: Elevate legs to the level of the heart or above for 30 minutes daily and/or when sitting, a frequency of: Avoid standing for long periods of time. Patient to wear own compression stockings every day. The following medication(s) was prescribed: Cleocin T topical 1 % lotion Apply lotion to affected area twice daily starting 08/24/2022 WOUND #2: - Gluteal fold Wound Laterality: Left Cleanser: Soap and Water 1 x Per Day/30 Days Discharge Instructions: May shower and wash wound with dial antibacterial soap and water prior to dressing change. Cleanser: Wound Cleanser 1 x Per Day/30 Days Discharge Instructions: Cleanse the wound with wound cleanser prior to applying a clean dressing using gauze sponges, not tissue or cotton balls. Prim Dressing: KerraCel Ag Gelling Fiber Dressing, 4x5 in (silver alginate) 1 x Per Day/30 Days ary Discharge Instructions: Apply silver alginate to wound bed as instructed Secondary Dressing: ABD Pad, 5x9 1 x Per Day/30 Days Discharge Instructions: Apply over primary dressing as directed. Secondary Dressing: Woven Gauze Sponge, Non-Sterile 4x4 in 1 x Per Day/30 Days Discharge Instructions: Apply over primary dressing as directed. 08/24/2022: Her dorsal foot wound is  healed. Her gluteal cleft wound is quite a bit smaller with just a light layer of slough on the surface. I debrided the slough from the surface of her gluteal cleft wound. She will continue to pack the area with silver alginate. I resent the prescription for the Cleocin lotion for her to treat her hidradenitis lesions. Unfortunately, she had an adverse reaction to doxycycline, so I will not prescribe this. We did place a referral to dermatology at her  last visit, but she has still not heard anything from them. I also suggested that she pick up some chlorhexidine and use this to wash the area prior to applying the Cleocin lotion. Follow-up in 1 week. Electronic Signature(s) Signed: 08/24/2022 4:02:29 PM By: Duanne Guess MD FACS Entered By: Duanne Guess on 08/24/2022 16:02:29 -------------------------------------------------------------------------------- HxROS Details Patient Name: Date of Service: [name] 08/24/2022 3:15 PM Medical Record Number: [id number] Patient Account Number: [id number] Date of Birth/Sex: Treating RN: 04/10/1976 (46 y.o. F) Primary Care Provider: Gwinda Passe Other Clinician: Referring Provider: Treating Provider/Extender: Sharyl Nimrod in Treatment: [address] [place] R ([id number]) [id number] Page 8 of 9 Information Obtained From Patient Hematologic/Lymphatic Medical History: Positive for: Anemia; Lymphedema Cardiovascular Medical History: Positive for: Hypertension; Hypotension Gastrointestinal Medical History: Positive for: Cirrhosis Past Medical History Notes: slow GI blood loss, alcoholic hepatitis Endocrine Medical History: Negative for: Type I Diabetes; Type II Diabetes Genitourinary Medical History: Past Medical History Notes: CKD stage III Psychiatric Medical History: Past Medical History Notes: depression Immunizations Pneumococcal Vaccine: Received  Pneumococcal Vaccination: No Implantable Devices None Hospitalization / Surgery History Type of Hospitalization/Surgery esophagogastroduodenoscopy biopsy cholecystectomy breast biopsy abdominoplasty tummy tuck hernia repair roux-en-y procedure tumor removal wrist ganglion incision Family and Social History Cancer: Yes - Mother; Diabetes: Yes - Mother; Heart Disease: No; Hereditary Spherocytosis: No; Hypertension: Yes - Mother,Maternal Grandparents,Paternal Grandparents; Kidney Disease: No; Lung Disease: No; Seizures: No; Stroke: No; Thyroid Problems: No; Tuberculosis: No; Current every day smoker - .5 pack a day; Marital Status - Single; Alcohol Use: Never; Drug Use: No History; Caffeine Use: Daily; Financial Concerns: Yes; Food, Clothing or Shelter Needs: No; Support System Lacking: No; Transportation Concerns: No Electronic Signature(s) Signed: 08/25/2022 7:41:18 AM By: Duanne Guess MD FACS Entered By: Duanne Guess on 08/24/2022 15:58:41 Minta Balsam ([id number]) [id number] Page 9 of 9 -------------------------------------------------------------------------------- SuperBill Details Patient Name: Date of Service: JAYLYNE, BREESE 08/24/2022 Medical Record Number: [id number] Patient Account Number: [id number] Date of Birth/Sex: Treating RN: [birth year] (46 y.o. Kateri Mc Primary Care Provider: Gwinda Passe Other Clinician: Referring Provider: Treating Provider/Extender: Sharyl Nimrod in Treatment: 13 Diagnosis Coding ICD-10 Codes Code Description [id number] Non-pressure chronic ulcer of other part of left foot with fat layer exposed L98.412 Non-pressure chronic ulcer of buttock with fat layer exposed N18.9 Chronic kidney disease, unspecified K70.10 Alcoholic hepatitis without ascites L73.2 Hidradenitis suppurativa Facility Procedures : CPT4 Code: 41324401 Description: 97597 - DEBRIDE WOUND 1ST  20 SQ CM OR < ICD-10 Diagnosis Description L98.412 Non-pressure chronic ulcer of buttock with fat layer exposed Modifier: Quantity: 1 Physician Procedures : CPT4 Code Description Modifier [id number] 99214 - WC PHYS LEVEL 4 - EST PT 25 ICD-10 Diagnosis Description L98.412 Non-pressure chronic ulcer of buttock with fat layer exposed L73.2 Hidradenitis suppurativa Quantity: 1 : [id number] 97597 - WC PHYS DEBR WO ANESTH 20 SQ CM ICD-10 Diagnosis Description L98.412 Non-pressure chronic ulcer of buttock with fat layer exposed Quantity: 1 Electronic Signature(s) Signed: 08/25/2022 7:43:36 AM By: Duanne Guess MD FACS Previous Signature: 08/24/2022 7:35:29 PM Version By: Tommie Ard RN Entered By: Duanne Guess on 08/25/2022 07:43:36

## 2022-08-29 ENCOUNTER — Other Ambulatory Visit: Payer: Self-pay

## 2022-08-29 DIAGNOSIS — K7030 Alcoholic cirrhosis of liver without ascites: Secondary | ICD-10-CM

## 2022-08-29 DIAGNOSIS — R772 Abnormality of alphafetoprotein: Secondary | ICD-10-CM

## 2022-08-29 DIAGNOSIS — R7989 Other specified abnormal findings of blood chemistry: Secondary | ICD-10-CM

## 2022-09-01 ENCOUNTER — Encounter (HOSPITAL_BASED_OUTPATIENT_CLINIC_OR_DEPARTMENT_OTHER): Payer: Self-pay | Admitting: General Surgery

## 2022-09-06 ENCOUNTER — Other Ambulatory Visit: Payer: Self-pay

## 2022-09-12 ENCOUNTER — Encounter (HOSPITAL_BASED_OUTPATIENT_CLINIC_OR_DEPARTMENT_OTHER): Payer: Self-pay | Attending: General Surgery | Admitting: General Surgery

## 2022-09-12 NOTE — Progress Notes (Signed)
[name], [name] ([id number]) [id number] Page 1 of 7 Visit Report for 09/12/2022 Arrival Information Details Patient Name: Date of Service: [name], [name] 09/12/2022 3:00 PM Medical Record Number: [id number] Patient Account Number: [id number] Date of Birth/Sex: Treating RN: 1975-12-02 (46 y.o. [name] Primary Care Leesha Veno: Juluis Mire Other Clinician: Referring Trooper Olander: Treating Joei Frangos/Extender: Patrici Ranks in Treatment: 15 Visit Information History Since Last Visit Added or deleted any medications: No Patient Arrived: Ambulatory Any new allergies or adverse reactions: No Arrival Time: 15:08 Had a fall or experienced change in No Accompanied By: self activities of daily living that may affect Transfer Assistance: None risk of falls: Patient Identification Verified: Yes Signs or symptoms of abuse/neglect since last visito No Secondary Verification Process Completed: Yes Hospitalized since last visit: No Patient Requires Transmission-Based Precautions: No Implantable device outside of the clinic excluding No Patient Has Alerts: No cellular tissue based products placed in the center since last visit: Has Dressing in Place as Prescribed: Yes Pain Present Now: No Electronic Signature(s) Signed: 09/12/2022 3:57:12 PM By: Adline Peals Entered By: Adline Peals on 09/12/2022 15:10:53 -------------------------------------------------------------------------------- Encounter Discharge Information Details Patient Name: Date of Service: Lori Hawking Chambers. 09/12/2022 3:00 PM Medical Record Number: [id number] Patient Account Number: [id number] Date of Birth/Sex: Treating RN: 04-27-1976 (46 y.o. [name] Primary Care Nikeria Kalman: Juluis Mire Other Clinician: Referring Simone Rodenbeck: Treating Jomari Bartnik/Extender: Patrici Ranks in Treatment: 15 Encounter  Discharge Information Items Discharge Condition: Stable Ambulatory Status: Ambulatory Discharge Destination: Home Transportation: Private Auto Accompanied By: self Schedule Follow-up Appointment: Yes Clinical Summary of Care: Patient Declined Electronic Signature(s) Signed: 09/12/2022 3:57:12 PM By: Adline Peals Entered By: Adline Peals on 09/12/2022 15:36:37 [name] ([id number]) [id number] Page 2 of 7 -------------------------------------------------------------------------------- Lower Extremity Assessment Details Patient Name: Date of Service: [name], [name] 09/12/2022 3:00 PM Medical Record Number: [id number] Patient Account Number: [id number] Date of Birth/Sex: Treating RN: [birth year] (46 y.o. [name] Primary Care Nikoletta Varma: Juluis Mire Other Clinician: Referring Shaton Lore: Treating Jakolby Sedivy/Extender: Ludwig Lean Weeks in Treatment: 15 Edema Assessment Assessed: [Left: No] [Right: No] Edema: [Left: Ye] [Right: s] Calf Left: Right: Point of Measurement: From Medial Instep 40 cm Ankle Left: Right: Point of Measurement: From Medial Instep 31.5 cm Electronic Signature(s) Signed: 09/12/2022 3:57:12 PM By: Adline Peals Entered By: Adline Peals on 09/12/2022 15:14:11 -------------------------------------------------------------------------------- Multi Wound Chart Details Patient Name: Date of Service: Lori Hawking Chambers. 09/12/2022 3:00 PM Medical Record Number: [id number] Patient Account Number: [id number] Date of Birth/Sex: Treating RN: 1976/02/12 (46 y.o. F) Primary Care Narek Kniss: Juluis Mire Other Clinician: Referring Keyshia Orwick: Treating Katja Blue/Extender: Patrici Ranks in Treatment: 15 Vital Signs Height(in): 66 Pulse(bpm): 94 Weight(lbs): 197 Blood Pressure(mmHg): 96/55 Body Mass Index(BMI): 31.8 Temperature(F):  98.5 Respiratory Rate(breaths/min): 16 [2:Photos:] [N/A:N/A] Left Gluteal fold N/A N/A Wound Location: Shear/Friction N/A N/A Wounding Event: Trauma, Other N/A N/A Primary Etiology: Anemia, Lymphedema, Hypertension, N/A N/A Comorbid History: Hypotension, Cirrhosis 03/20/2022 N/A N/A Date AcquiredTARRIN, [name] ([id number]) [id number] Page 3 of 7 15 N/A N/A Weeks of Treatment: Open N/A N/A Wound Status: No N/A N/A Wound Recurrence: 5.5x4.5x0.5 N/A N/A Measurements L x W x D (cm) 19.439 N/A N/A A (cm) : rea 9.719 N/A N/A Volume (cm) : 23.10% N/A N/A % Reduction in A rea: 70.40% N/A N/A % Reduction in Volume: 11 Starting Position 1 (o'clock): 1 Ending Position 1 (o'clock): 0.5 Maximum Distance 1 (cm): Yes N/A N/A Undermining: Full Thickness  Without Exposed N/A N/A Classification: Support Structures Medium N/A N/A Exudate Amount: Serosanguineous N/A N/A Exudate Type: red, brown N/A N/A Exudate Color: Distinct, outline attached N/A N/A Wound Margin: Large (67-100%) N/A N/A Granulation Amount: Red N/A N/A Granulation Quality: None Present (0%) N/A N/A Necrotic Amount: Fat Layer (Subcutaneous Tissue): Yes N/A N/A Exposed Structures: Fascia: No Tendon: No Muscle: No Joint: No Bone: No Small (1-33%) N/A N/A Epithelialization: No Abnormalities Noted N/A N/A Periwound Skin Texture: No Abnormalities Noted N/A N/A Periwound Skin Moisture: No Abnormalities Noted N/A N/A Periwound Skin Color: No Abnormality N/A N/A Temperature: Yes N/A N/A Tenderness on Palpation: Treatment Notes Wound #2 (Gluteal fold) Wound Laterality: Left Cleanser Soap and Water Discharge Instruction: May shower and wash wound with dial antibacterial soap and water prior to dressing change. Wound Cleanser Discharge Instruction: Cleanse the wound with wound cleanser prior to applying a clean dressing using gauze sponges, not tissue or cotton  balls. Peri-Wound Care Topical Primary Dressing Sorbalgon AG Dressing 6x6 (in/in) Discharge Instruction: Apply to wound bed as instructed Secondary Dressing Zetuvit Plus Silicone Border Dressing 5x5 (in/in) Discharge Instruction: Apply silicone border over primary dressing as directed. Secured With Compression Wrap Compression Stockings Environmental education officer) Signed: 09/12/2022 3:39:18 PM By: Fredirick Maudlin MD FACS Entered By: Fredirick Maudlin on 09/12/2022 15:39:18 -------------------------------------------------------------------------------- Multi-Disciplinary Care Plan Details Patient Name: Date of Service: Lori Hawking Chambers. 09/12/2022 3:00 PM [name] ([id number]) [id number] Page 4 of 7 Medical Record Number: [id number] Patient Account Number: [id number] Date of Birth/Sex: Treating RN: 09-Sep-1976 (46 y.o. [name] Primary Care Jerritt Cardoza: Juluis Mire Other Clinician: Referring Pj Zehner: Treating Verdine Grenfell/Extender: Patrici Ranks in Treatment: [place] reviewed with physician Active Inactive Pressure Nursing Diagnoses: Knowledge deficit related to causes and risk factors for pressure ulcer development Potential for impaired tissue integrity related to pressure, friction, moisture, and shear Goals: Patient will remain free from development of additional pressure ulcers Date Initiated: 05/24/2022 Date Inactivated: 06/27/2022 Target Resolution Date: 06/30/2022 Goal Status: Met Patient/caregiver will verbalize risk factors for pressure ulcer development Date Initiated: 05/24/2022 Date Inactivated: 06/27/2022 Target Resolution Date: 06/30/2022 Goal Status: Met Patient/caregiver will verbalize understanding of pressure ulcer management Date Initiated: 06/27/2022 Target Resolution Date: 11/23/2022 Goal Status: Active Interventions: Assess: immobility, friction,  shearing, incontinence upon admission and as needed Assess potential for pressure ulcer upon admission and as needed Notes: Wound/Skin Impairment Nursing Diagnoses: Impaired tissue integrity Knowledge deficit related to ulceration/compromised skin integrity Goals: Patient will demonstrate a reduced rate of smoking or cessation of smoking Date Initiated: 05/24/2022 Target Resolution Date: 11/23/2022 Goal Status: Active Patient/caregiver will verbalize understanding of skin care regimen Date Initiated: 05/24/2022 Target Resolution Date: 11/23/2022 Goal Status: Active Ulcer/skin breakdown will have a volume reduction of 50% by week 8 Date Initiated: 06/27/2022 Date Inactivated: 07/24/2022 Target Resolution Date: 07/25/2022 Goal Status: Unmet Unmet Reason: infection in foot Ulcer/skin breakdown will have a volume reduction of 80% by week 12 Date Initiated: 07/24/2022 Target Resolution Date: 11/23/2022 Goal Status: Active Interventions: Assess patient/caregiver ability to obtain necessary supplies Assess ulceration(s) every visit Treatment Activities: Skin care regimen initiated : 05/24/2022 Topical wound management initiated : 05/24/2022 Notes: Electronic Signature(s) Signed: 09/12/2022 3:57:12 PM By: Adline Peals Entered By: Adline Peals on 09/12/2022 15:35:59 [name] ([id number]) [id number] Page 5 of 7 -------------------------------------------------------------------------------- Pain Assessment Details Patient Name: Date of Service: [name], [name] 09/12/2022 3:00 PM Medical Record Number: [id number] Patient Account Number: [id number] Date of Birth/Sex: Treating RN: March 06, 1976 (46  y.o. [name] Primary Care Finis Hendricksen: Juluis Mire Other Clinician: Referring Malay Fantroy: Treating Savalas Monje/Extender: Patrici Ranks in Treatment: 15 Active Problems Location of Pain Severity and  Description of Pain Patient Has Paino No Site Locations Rate the pain. Current Pain Level: 0 Pain Management and Medication Current Pain Management: Electronic Signature(s) Signed: 09/12/2022 3:57:12 PM By: Adline Peals Entered By: Adline Peals on 09/12/2022 15:11:02 -------------------------------------------------------------------------------- Patient/Caregiver Education Details Patient Name: Date of Service: [name] 12/19/2023andnbsp3:00 PM Medical Record Number: [id number] Patient Account Number: [id number] Date of Birth/Gender: Treating RN: 01-Sep-1976 (46 y.o. [name] Primary Care Physician: Juluis Mire Other Clinician: Referring Physician: Treating Physician/Extender: Patrici Ranks in Treatment: 15 Education Assessment Education Provided To: Patient Education Topics Provided Wound/Skin Impairment: Methods: Explain/Verbal Responses: Reinforcements needed, State content correctly Electronic Signature(s) Signed: 09/12/2022 3:57:12 PM By: Adline Peals Entered By: Adline Peals on 09/12/2022 15:36:17 [name] ([id number]) [id number] Page 6 of 7 -------------------------------------------------------------------------------- Wound Assessment Details Patient Name: Date of Service: [name], [name] 09/12/2022 3:00 PM Medical Record Number: [id number] Patient Account Number: [id number] Date of Birth/Sex: Treating RN: 1975/10/08 (46 y.o. [name] Primary Care Gavyn Ybarra: Juluis Mire Other Clinician: Referring Kennedi Lizardo: Treating Arn Mcomber/Extender: Patrici Ranks in Treatment: 15 Wound Status Wound Number: 2 Primary Etiology: Trauma, Other Wound Location: Left Gluteal fold Wound Status: Open Wounding Event: Shear/Friction Comorbid Anemia, Lymphedema, Hypertension, Hypotension, History: Cirrhosis Date Acquired:  03/20/2022 Weeks Of Treatment: 15 Clustered Wound: No Photos Wound Measurements Length: (cm) 5.5 Width: (cm) 4.5 Depth: (cm) 0.5 Area: (cm) 19.439 Volume: (cm) 9.719 % Reduction in Area: 23.1% % Reduction in Volume: 70.4% Epithelialization: Small (1-33%) Undermining: Yes Starting Position (o'clock): 11 Ending Position (o'clock): 1 Maximum Distance: (cm) 0.5 Wound Description Classification: Full Thickness Without Exposed Suppor Wound Margin: Distinct, outline attached Exudate Amount: Medium Exudate Type: Serosanguineous Exudate Color: red, brown t Structures Foul Odor After Cleansing: No Slough/Fibrino No Wound Bed Granulation Amount: Large (67-100%) Exposed Structure Granulation Quality: Red Fascia Exposed: No Necrotic Amount: None Present (0%) Fat Layer (Subcutaneous Tissue) Exposed: Yes Tendon Exposed: No Muscle Exposed: No Joint Exposed: No Bone Exposed: No Periwound Skin Texture Texture Color No Abnormalities Noted: Yes No Abnormalities Noted: Yes Moisture Temperature / Pain No Abnormalities Noted: Yes Temperature: No Abnormality Tenderness on Palpation: Yes Treatment Notes [name], [name] ([id number]) [id number] Page 7 of 7 Wound #2 (Gluteal fold) Wound Laterality: Left Cleanser Soap and Water Discharge Instruction: May shower and wash wound with dial antibacterial soap and water prior to dressing change. Wound Cleanser Discharge Instruction: Cleanse the wound with wound cleanser prior to applying a clean dressing using gauze sponges, not tissue or cotton balls. Peri-Wound Care Topical Primary Dressing Sorbalgon AG Dressing 6x6 (in/in) Discharge Instruction: Apply to wound bed as instructed Secondary Dressing Zetuvit Plus Silicone Border Dressing 5x5 (in/in) Discharge Instruction: Apply silicone border over primary dressing as directed. Secured With Compression Wrap Compression Stockings Sport and exercise psychologist) Signed: 09/12/2022 3:57:12 PM By: Adline Peals Entered By: Adline Peals on 09/12/2022 15:22:21 -------------------------------------------------------------------------------- Vitals Details Patient Name: Date of Service: Lori Hawking Chambers. 09/12/2022 3:00 PM Medical Record Number: [id number] Patient Account Number: [id number] Date of Birth/Sex: Treating RN: 12/30/1975 (46 y.o. [name] Primary Care Vaniyah Lansky: Juluis Mire Other Clinician: Referring Laithan Conchas: Treating Om Lizotte/Extender: Patrici Ranks in Treatment: 15 Vital Signs Time Taken: 15:12 Temperature (F): 98.5 Height (in): 66 Pulse (bpm): 94 Weight (lbs): 197 Respiratory Rate (breaths/min): 16 Body  Mass Index (BMI): 31.8 Blood Pressure (mmHg): 96/55 Reference Range: 80 - 120 mg / dl Electronic Signature(s) Signed: 09/12/2022 3:57:12 PM By: Adline Peals Entered By: Adline Peals on 09/12/2022 15:14:06

## 2022-09-12 NOTE — Progress Notes (Signed)
JOBI, BRAZEL Chambers ([id number]) [id number] Page 1 of 8 Visit Report for 09/12/2022 Chief Complaint Document Details Patient Name: Date of Service: [name], KOVACK 09/12/2022 3:00 PM Medical Record Number: [id number] Patient Account Number: [id number] Date of Birth/Sex: Treating RN: 10-30-1975 (46 y.o. F) Primary Care Provider: Juluis Mire Other Clinician: Referring Provider: Treating Provider/Extender: Patrici Ranks in Treatment: 15 Information Obtained from: Patient Chief Complaint Patient seen for complaints of Non-Healing Wounds. Electronic Signature(s) Signed: 09/12/2022 3:39:52 PM By: Fredirick Maudlin MD FACS Entered By: Fredirick Maudlin on 09/12/2022 15:39:52 -------------------------------------------------------------------------------- HPI Details Patient Name: Date of Service: Lori Hawking Chambers. 09/12/2022 3:00 PM Medical Record Number: [id number] Patient Account Number: [id number] Date of Birth/Sex: Treating RN: 01-17-1976 (46 y.o. F) Primary Care Provider: Juluis Mire Other Clinician: Referring Provider: Treating Provider/Extender: Patrici Ranks in Treatment: 15 History of Present Illness HPI Description: ADMISSION 05/24/2022 This is a 46 year old woman with a past medical history significant for hypertension, alcoholic hepatitis, chronic anemia, chronic kidney disease, and peptic ulcer disease. She was admitted to the hospital in the middle of July for hypovolemic shock secondary to dehydration from gastroenteritis. She was found on admission to have an ulcer on her left buttock within the confines of the natal cleft and approaching the anal verge. The etiology was felt to be moisture and friction rather than pressure, as the patient is completely ambulatory. No debridement was performed. She was readmitted at the beginning of August with worsening lower extremity edema  and fatigue. The wound was basically unchanged. Wound nursing was consulted and they recommended using silver alginate. She was discharged from the hospital with referral to the wound care center. This past weekend, she noted a wound on her dorsal left foot that she thinks was secondary to friction from shoes. She is here for evaluation of both these wounds. ABI in clinic today was 1.18. She reports that she has quit drinking but she continues to smoke. On the dorsal surface of her left foot between her fourth and fifth metatarsal heads, there is a circular wound exposing the fat layer. There is some slough and eschar accumulation. It does appear consistent with her history of abrasion. On the medial aspect of her left buttock extending into the natal cleft and towards the anal verge. There is a large ulcer with a very clean surface. There is substantial undermining at the cranial portion of the wound. No purulent drainage or malodor. 06/08/2022: The patient returns today with significant worsening of her dorsal foot wound. Apparently she saw her PCP yesterday who was concerned for cellulitis but did not prescribe any antibiotics. She was also sent for a DVT scan which was negative. There is heavy slough as well as liquefactive necrosis of the fat layer. There is a bulla between the second and third toes that looks as though it may rupture in the near future. The wound undermines under the skin towards this bulla. No frankly purulent drainage or any odor. Her gluteal wound is very clean but is relatively unchanged in size. She has been managing her wounds on her own and finds it difficult to pack the silver alginate into the undermined portion of the buttock wound. She says that she has been putting peroxide on her foot. 06/19/2022: The patient was not seen as a wound care visit last week because she arrived too late to her appointment. She did have a nursing visit, however. The culture that I took on  14 September grew out  a polymicrobial population including Bacteroides fragilis Citrobacter Morganella Escherichia coli Enterococcus faecalis Pseudomonas aeruginosa and many others. I prescribed Bactrim and Augmentin for this polymicrobial infection based upon the resistance genes detected. Keystone topical compounded antibiotic was also ordered and she has been using that on her foot as well. Unfortunately, the wound on her dorsal foot continues to deteriorate. There is a larger area of undermining and more necrotic tissue, including muscle, on the wound surface. No significant malodor or frank pus. Her gluteal wound, on the other hand, looks quite good. It is smaller and very clean and the undermined portion has contracted substantially. 06/27/2022: Last week after our visit, she went to the hospital where she was admitted with sepsis. She underwent surgical debridement of her dorsal foot SARRAH, FIORENZA Chambers ([id number]) [id number] Page 2 of 8 wound. Cultures taken intraoperatively were negative and a piece of PuraPly was sutured in place. She is currently taking Augmentin as prescribed on discharge. Her gluteal wound continues to contract. It is more superficial and there is even less undermining. She does have some biofilm accumulation on the surface. The dorsal foot wound is stained yellow from the Xeroform that was applied by podiatry. PuraPly is covering about a third of the wound. The rest of the wound surface is very clean. She did have quite a bit of drainage, but it was not purulent or malodorous. 07/05/2022: The wound on her buttocks continues to contract. It is very clean without any accumulation of slough or other debris. The PuraPly that was on her foot came off with her dressing. There are a couple of sutures remaining around the perimeter of the wound. There is slough buildup but no signs of necrosis. Granulation tissue is starting to form. 10/19; the patient  has 2 wounds in the left buttock in the gluteal fold which was a pressure ulcer from a stay in hospital during the summer. She has a more recent wound on the left dorsal foot which was apparently complications of infection. Both wound surfaces look healthy. She is using silver alginate on the left foot 07/24/2022: I have not seen her in 2 weeks and both wounds have contracted considerably since then. The dorsal foot wound has come in by a centimeter in width since her last visit. The gluteal wound is quite clean; there is a bit of slough buildup on the dorsal foot wound. 07/31/2022: Both wounds are smaller again today. There is some dry eschar around the perimeter of the dorsal foot wound. There is senescent skin that has accumulated around the perimeter of the gluteal wound. No concern for infection. 08/10/2022: The wounds continue to improve. The dorsal foot wound is nearly closed with just a linear opening and some light eschar. The gluteal wound has a little slough on the surface. She has some newly opened hidradenitis lesions, however. In the past, I had noted her scars, but she had not shown any signs of active disease. She does report that she has had previous areas open in her armpits and groin area, similar to those in her natal cleft. 08/24/2022: Her dorsal foot wound is healed. Her gluteal cleft wound is quite a bit smaller with just a light layer of slough on the surface. Unfortunately, for some reason her pharmacy did not give her the Cleocin lotion that I prescribed when she went to pick up prescriptions yesterday. She still has open and active hidradenitis lesions on her buttocks. 09/12/2022: The gluteal wound is a little bit smaller today and very  clean. She has been using the Cleocin topically. She has a appointment with dermatology, but not until May. There is some purulent and bloody drainage coming from several of the hidradenitis lesions, but the majority actually look  better. Electronic Signature(s) Signed: 09/12/2022 3:41:39 PM By: Fredirick Maudlin MD FACS Entered By: Fredirick Maudlin on 09/12/2022 15:41:39 -------------------------------------------------------------------------------- Physical Exam Details Patient Name: Date of Service: Lori Hawking Chambers. 09/12/2022 3:00 PM Medical Record Number: [id number] Patient Account Number: [id number] Date of Birth/Sex: Treating RN: 10-17-1975 (46 y.o. F) Primary Care Provider: Juluis Mire Other Clinician: Referring Provider: Treating Provider/Extender: Patrici Ranks in Treatment: 15 Constitutional Within normal range for this patient.. . . . No acute distress. Respiratory Normal work of breathing on room air. Notes 09/12/2022: The gluteal wound is a little bit smaller today and very clean. Electronic Signature(s) Signed: 09/12/2022 3:42:32 PM By: Fredirick Maudlin MD FACS Entered By: Fredirick Maudlin on 09/12/2022 15:42:32 -------------------------------------------------------------------------------- Physician Orders Details Patient Name: Date of Service: Lori Hawking Chambers. 09/12/2022 3:00 PM Medical Record Number: [id number] Patient Account Number: [id number] Date of Birth/Sex: Treating RN: 1976-03-13 (46 y.o. [name] Primary Care Provider: Juluis Mire Other Clinician: Referring Provider: Treating Provider/Extender: Ludwig Lean [place], [place] Chambers ([id number]) [id number] Page 3 of 8 Weeks in Treatment: 15 Verbal / Phone Orders: No Diagnosis Coding ICD-10 Coding Code Description L97.522 Non-pressure chronic ulcer of other part of left foot with fat layer exposed L98.412 Non-pressure chronic ulcer of buttock with fat layer exposed N18.9 Chronic kidney disease, unspecified 123XX123 Alcoholic hepatitis without ascites L73.2 Hidradenitis suppurativa Follow-up Appointments ppointment in 2  weeks. - Dr. Celine Ahr Room 4 Return A Anesthetic (In clinic) Topical Lidocaine 5% applied to wound bed Bathing/ Shower/ Hygiene May shower and wash wound with soap and water. - with dressing changes Edema Control - Lymphedema / SCD / Other Elevate legs to the level of the heart or above for 30 minutes daily and/or when sitting, a frequency of: Avoid standing for long periods of time. Patient to wear own compression stockings every day. Wound Treatment Wound #2 - Gluteal fold Wound Laterality: Left Cleanser: Soap and Water 1 x Per Day/30 Days Discharge Instructions: May shower and wash wound with dial antibacterial soap and water prior to dressing change. Cleanser: Wound Cleanser 1 x Per Day/30 Days Discharge Instructions: Cleanse the wound with wound cleanser prior to applying a clean dressing using gauze sponges, not tissue or cotton balls. Prim Dressing: Sorbalgon AG Dressing 6x6 (in/in) 1 x Per Day/30 Days ary Discharge Instructions: Apply to wound bed as instructed Secondary Dressing: Zetuvit Plus Silicone Border Dressing 5x5 (in/in) 1 x Per Day/30 Days Discharge Instructions: Apply silicone border over primary dressing as directed. Patient Medications llergies: adhesive tape A Notifications Medication Indication Start End 09/12/2022 lidocaine DOSE topical 5 % ointment - ointment topical Electronic Signature(s) Signed: 09/12/2022 4:03:40 PM By: Fredirick Maudlin MD FACS Entered By: Fredirick Maudlin on 09/12/2022 15:42:43 -------------------------------------------------------------------------------- Problem List Details Patient Name: Date of Service: Lori Hawking Chambers. 09/12/2022 3:00 PM Medical Record Number: [id number] Patient Account Number: [id number] Date of Birth/Sex: Treating RN: [birth year] (46 y.o. F) Primary Care Provider: Juluis Mire Other Clinician: Referring Provider: Treating Provider/Extender: Patrici Ranks in Treatment:  [address] [place] Chambers ([id number]) [id number] Page 4 of 8 Active Problems ICD-10 Encounter Code Description Active Date MDM Diagnosis L97.522 Non-pressure chronic ulcer of other part of left foot with fat layer exposed 05/24/2022 No Yes L98.412 Non-pressure chronic ulcer of  buttock with fat layer exposed 05/24/2022 No Yes N18.9 Chronic kidney disease, unspecified 05/24/2022 No Yes [id number] Alcoholic hepatitis without ascites 05/24/2022 No Yes L73.2 Hidradenitis suppurativa 08/10/2022 No Yes Inactive Problems Resolved Problems Electronic Signature(s) Signed: 09/12/2022 3:37:09 PM By: Fredirick Maudlin MD FACS Entered By: Fredirick Maudlin on 09/12/2022 15:37:09 -------------------------------------------------------------------------------- Progress Note Details Patient Name: Date of Service: Lori Hawking Chambers. 09/12/2022 3:00 PM Medical Record Number: [id number] Patient Account Number: [id number] Date of Birth/Sex: Treating RN: 1975/12/27 (46 y.o. F) Primary Care Provider: Juluis Mire Other Clinician: Referring Provider: Treating Provider/Extender: Patrici Ranks in Treatment: 15 Subjective Chief Complaint Information obtained from Patient Patient seen for complaints of Non-Healing Wounds. History of Present Illness (HPI) ADMISSION 05/24/2022 This is a 46 year old woman with a past medical history significant for hypertension, alcoholic hepatitis, chronic anemia, chronic kidney disease, and peptic ulcer disease. She was admitted to the hospital in the middle of July for hypovolemic shock secondary to dehydration from gastroenteritis. She was found on admission to have an ulcer on her left buttock within the confines of the natal cleft and approaching the anal verge. The etiology was felt to be moisture and friction rather than pressure, as the patient is completely ambulatory. No debridement was performed. She was readmitted at  the beginning of August with worsening lower extremity edema and fatigue. The wound was basically unchanged. Wound nursing was consulted and they recommended using silver alginate. She was discharged from the hospital with referral to the wound care center. This past weekend, she noted a wound on her dorsal left foot that she thinks was secondary to friction from shoes. She is here for evaluation of both these wounds. ABI in clinic today was 1.18. She reports that she has quit drinking but she continues to smoke. On the dorsal surface of her left foot between her fourth and fifth metatarsal heads, there is a circular wound exposing the fat layer. There is some slough and eschar accumulation. It does appear consistent with her history of abrasion. On the medial aspect of her left buttock extending into the natal cleft and towards the anal verge. There is a large ulcer with a very clean surface. There is substantial undermining at the cranial portion of the wound. No purulent drainage or malodor. 06/08/2022: The patient returns today with significant worsening of her dorsal foot wound. Apparently she saw her PCP yesterday who was concerned for cellulitis but did not prescribe any antibiotics. She was also sent for a DVT scan which was negative. There is heavy slough as well as liquefactive necrosis of the fat layer. There is a bulla between the second and third toes that looks as though it may rupture in the near future. The wound undermines under the skin towards this bulla. No frankly purulent drainage or any odor. Her gluteal wound is very clean but is relatively unchanged in size. She has been managing her CLYDINE, MCHATTON ([id number]) [id number] Page 5 of 8 wounds on her own and finds it difficult to pack the silver alginate into the undermined portion of the buttock wound. She says that she has been putting peroxide on her foot. 06/19/2022: The patient was not seen as a  wound care visit last week because she arrived too late to her appointment. She did have a nursing visit, however. The culture that I took on 14 September grew out a polymicrobial population including Bacteroides fragilis Citrobacter Morganella Escherichia coli Enterococcus faecalis Pseudomonas aeruginosa and many others. I prescribed Bactrim and  Augmentin for this polymicrobial infection based upon the resistance genes detected. Keystone topical compounded antibiotic was also ordered and she has been using that on her foot as well. Unfortunately, the wound on her dorsal foot continues to deteriorate. There is a larger area of undermining and more necrotic tissue, including muscle, on the wound surface. No significant malodor or frank pus. Her gluteal wound, on the other hand, looks quite good. It is smaller and very clean and the undermined portion has contracted substantially. 06/27/2022: Last week after our visit, she went to the hospital where she was admitted with sepsis. She underwent surgical debridement of her dorsal foot wound. Cultures taken intraoperatively were negative and a piece of PuraPly was sutured in place. She is currently taking Augmentin as prescribed on discharge. Her gluteal wound continues to contract. It is more superficial and there is even less undermining. She does have some biofilm accumulation on the surface. The dorsal foot wound is stained yellow from the Xeroform that was applied by podiatry. PuraPly is covering about a third of the wound. The rest of the wound surface is very clean. She did have quite a bit of drainage, but it was not purulent or malodorous. 07/05/2022: The wound on her buttocks continues to contract. It is very clean without any accumulation of slough or other debris. The PuraPly that was on her foot came off with her dressing. There are a couple of sutures remaining around the perimeter of the wound. There is slough buildup but no signs of  necrosis. Granulation tissue is starting to form. 10/19; the patient has 2 wounds in the left buttock in the gluteal fold which was a pressure ulcer from a stay in hospital during the summer. She has a more recent wound on the left dorsal foot which was apparently complications of infection. Both wound surfaces look healthy. She is using silver alginate on the left foot 07/24/2022: I have not seen her in 2 weeks and both wounds have contracted considerably since then. The dorsal foot wound has come in by a centimeter in width since her last visit. The gluteal wound is quite clean; there is a bit of slough buildup on the dorsal foot wound. 07/31/2022: Both wounds are smaller again today. There is some dry eschar around the perimeter of the dorsal foot wound. There is senescent skin that has accumulated around the perimeter of the gluteal wound. No concern for infection. 08/10/2022: The wounds continue to improve. The dorsal foot wound is nearly closed with just a linear opening and some light eschar. The gluteal wound has a little slough on the surface. She has some newly opened hidradenitis lesions, however. In the past, I had noted her scars, but she had not shown any signs of active disease. She does report that she has had previous areas open in her armpits and groin area, similar to those in her natal cleft. 08/24/2022: Her dorsal foot wound is healed. Her gluteal cleft wound is quite a bit smaller with just a light layer of slough on the surface. Unfortunately, for some reason her pharmacy did not give her the Cleocin lotion that I prescribed when she went to pick up prescriptions yesterday. She still has open and active hidradenitis lesions on her buttocks. 09/12/2022: The gluteal wound is a little bit smaller today and very clean. She has been using the Cleocin topically. She has a appointment with dermatology, but not until May. There is some purulent and bloody drainage coming from several of  the hidradenitis lesions, but the majority actually look better. Patient History Information obtained from Patient. Family History Cancer - Mother, Diabetes - Mother, Hypertension - Mother,Maternal Grandparents,Paternal Grandparents, No family history of Heart Disease, Hereditary Spherocytosis, Kidney Disease, Lung Disease, Seizures, Stroke, Thyroid Problems, Tuberculosis. Social History Current every day smoker - .5 pack a day, Marital Status - Single, Alcohol Use - Never, Drug Use - No History, Caffeine Use - Daily. Medical History Hematologic/Lymphatic Patient has history of Anemia, Lymphedema Cardiovascular Patient has history of Hypertension, Hypotension Gastrointestinal Patient has history of Cirrhosis Endocrine Denies history of Type I Diabetes, Type II Diabetes Hospitalization/Surgery History - esophagogastroduodenoscopy. - biopsy. - cholecystectomy. - breast biopsy. - abdominoplasty. - tummy tuck. - hernia repair. - roux-en-y procedure. - tumor removal. - wrist ganglion incision. Medical A Surgical History Notes nd Gastrointestinal slow GI blood loss, alcoholic hepatitis Genitourinary CKD stage III Psychiatric depression Objective Constitutional Within normal range for this patient.. No acute distress. Vitals Time Taken: 3:12 PM, Height: 66 in, Weight: 197 lbs, BMI: 31.8, Temperature: 98.5 F, Pulse: 94 bpm, Respiratory Rate: 16 breaths/min, Blood Pressure: 96/55 mmHg. TISHA, STOLAR Chambers ([id number]) [id number] Page 6 of 8 Respiratory Normal work of breathing on room air. General Notes: 09/12/2022: The gluteal wound is a little bit smaller today and very clean. Integumentary (Hair, Skin) Wound #2 status is Open. Original cause of wound was Shear/Friction. The date acquired was: 03/20/2022. The wound has been in treatment 15 weeks. The wound is located on the Left Gluteal fold. The wound measures 5.5cm length x 4.5cm width x 0.5cm depth;  19.439cm^2 area and 9.719cm^3 volume. There is Fat Layer (Subcutaneous Tissue) exposed. There is undermining starting at 11:00 and ending at 1:00 with a maximum distance of 0.5cm. There is a medium amount of serosanguineous drainage noted. The wound margin is distinct with the outline attached to the wound base. There is large (67-100%) red granulation within the wound bed. There is no necrotic tissue within the wound bed. The periwound skin appearance had no abnormalities noted for texture. The periwound skin appearance had no abnormalities noted for moisture. The periwound skin appearance had no abnormalities noted for color. Periwound temperature was noted as No Abnormality. The periwound has tenderness on palpation. 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 - Dr. Celine Ahr Room 4 Anesthetic: (In clinic) Topical Lidocaine 5% applied to wound bed Bathing/ Shower/ Hygiene: May shower and wash wound with soap and water. - with dressing changes Edema Control - Lymphedema / SCD / Other: Elevate legs to the level of the heart or above for 30 minutes daily and/or when sitting, a frequency of: Avoid standing for long periods of time. Patient to wear own compression stockings every day. The following medication(s) was prescribed: lidocaine topical 5 % ointment ointment topical was prescribed at facility WOUND #2: - Gluteal fold Wound Laterality: Left Cleanser: Soap and Water 1 x Per Day/30 Days Discharge Instructions: May shower and wash wound with dial antibacterial soap and water prior to dressing change. Cleanser: Wound Cleanser 1 x Per Day/30 Days Discharge Instructions: Cleanse the wound with wound cleanser  prior to applying a clean dressing using gauze sponges, not tissue or cotton balls. Prim Dressing: Sorbalgon AG Dressing 6x6 (in/in) 1 x Per Day/30 Days ary Discharge Instructions: Apply to wound  bed as instructed Secondary Dressing: Zetuvit Plus Silicone Border Dressing 5x5 (in/in) 1 x Per Day/30 Days Discharge Instructions: Apply silicone border over primary dressing as directed. 09/12/2022: The gluteal wound is a little bit smaller today and very clean. No debridement was necessary today. Continue to pack the wound with silver alginate. She should continue to use the topical Cleocin lotion to her hidradenitis wounds. Unfortunately, she does not tolerate doxycycline. We will request that she be seen more urgently by dermatology. Follow-up in 2 weeks, due to the holiday schedule. Electronic Signature(s) Signed: 09/12/2022 3:50:19 PM By: Fredirick Maudlin MD FACS Entered By: Fredirick Maudlin on 09/12/2022 15:50:18 -------------------------------------------------------------------------------- HxROS Details Patient Name: Date of Service: [name] MISHA Chambers. 09/12/2022 3:00 PM [name] ([id number] Page 7 of 8 Medical Record Number: [id number] Patient Account Number: [id number] Date of Birth/Sex: Treating RN: Aug 25, 1976 (46 y.o. F) Primary Care Provider: Juluis Mire Other Clinician: Referring Provider: Treating Provider/Extender: Patrici Ranks in Treatment: 15 Information Obtained From Patient Hematologic/Lymphatic Medical History: Positive for: Anemia; Lymphedema Cardiovascular Medical History: Positive for: Hypertension; Hypotension Gastrointestinal Medical History: Positive for: Cirrhosis Past Medical History Notes: slow GI blood loss, alcoholic hepatitis Endocrine Medical History: Negative for: Type I Diabetes; Type II Diabetes Genitourinary Medical History: Past Medical History Notes: CKD  stage III Psychiatric Medical History: Past Medical History Notes: depression Immunizations Pneumococcal Vaccine: Received Pneumococcal Vaccination: No Implantable Devices None Hospitalization / Surgery History Type of Hospitalization/Surgery esophagogastroduodenoscopy biopsy cholecystectomy breast biopsy abdominoplasty tummy tuck hernia repair roux-en-y procedure tumor removal wrist ganglion incision Family and Social History Cancer: Yes - Mother; Diabetes: Yes - Mother; Heart Disease: No; Hereditary Spherocytosis: No; Hypertension: Yes - Mother,Maternal Grandparents,Paternal Grandparents; Kidney Disease: No; Lung Disease: No; Seizures: No; Stroke: No; Thyroid Problems: No; Tuberculosis: No; Current every day smoker - .5 pack a day; Marital Status - Single; Alcohol Use: Never; Drug Use: No History; Caffeine Use: Daily; Financial Concerns: Yes; Food, Clothing or Shelter Needs: No; Support System Lacking: No; Transportation Concerns: No Electronic Signature(s) Signed: 09/12/2022 4:03:40 PM By: Fredirick Maudlin MD FACS Entered By: Fredirick Maudlin on 09/12/2022 15:41:45 [name] ([id number]) [id number] Page 8 of 8 -------------------------------------------------------------------------------- SuperBill Details Patient Name: Date of Service: [name], ACOSTA 09/12/2022 Medical Record Number: [id number] Patient Account Number: [id number] Date of Birth/Sex: Treating RN: Aug 22, 1976 (46 y.o. 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

## 2022-09-13 ENCOUNTER — Encounter (HOSPITAL_COMMUNITY): Payer: Self-pay

## 2022-09-13 ENCOUNTER — Emergency Department (HOSPITAL_COMMUNITY): Payer: Self-pay

## 2022-09-13 ENCOUNTER — Other Ambulatory Visit: Payer: Self-pay

## 2022-09-13 ENCOUNTER — Telehealth: Payer: Self-pay | Admitting: Gastroenterology

## 2022-09-13 ENCOUNTER — Inpatient Hospital Stay (HOSPITAL_COMMUNITY)
Admission: EM | Admit: 2022-09-13 | Discharge: 2022-09-21 | DRG: 432 | Disposition: A | Discharge status: Home / Self Care | Payer: Self-pay | Attending: Internal Medicine | Admitting: Internal Medicine

## 2022-09-13 DIAGNOSIS — D509 Iron deficiency anemia, unspecified: Secondary | ICD-10-CM | POA: Diagnosis present

## 2022-09-13 DIAGNOSIS — Z79899 Other long term (current) drug therapy: Secondary | ICD-10-CM

## 2022-09-13 DIAGNOSIS — Z597 Insufficient social insurance and welfare support: Secondary | ICD-10-CM

## 2022-09-13 DIAGNOSIS — N179 Acute kidney failure, unspecified: Secondary | ICD-10-CM | POA: Diagnosis present

## 2022-09-13 DIAGNOSIS — Z9884 Bariatric surgery status: Secondary | ICD-10-CM

## 2022-09-13 DIAGNOSIS — K767 Hepatorenal syndrome: Secondary | ICD-10-CM | POA: Diagnosis present

## 2022-09-13 DIAGNOSIS — D689 Coagulation defect, unspecified: Secondary | ICD-10-CM | POA: Diagnosis present

## 2022-09-13 DIAGNOSIS — K7031 Alcoholic cirrhosis of liver with ascites: Principal | ICD-10-CM | POA: Diagnosis present

## 2022-09-13 DIAGNOSIS — K5903 Drug induced constipation: Secondary | ICD-10-CM | POA: Diagnosis not present

## 2022-09-13 DIAGNOSIS — Z720 Tobacco use: Secondary | ICD-10-CM | POA: Diagnosis present

## 2022-09-13 DIAGNOSIS — T402X5A Adverse effect of other opioids, initial encounter: Secondary | ICD-10-CM | POA: Diagnosis not present

## 2022-09-13 DIAGNOSIS — D6959 Other secondary thrombocytopenia: Secondary | ICD-10-CM | POA: Diagnosis present

## 2022-09-13 DIAGNOSIS — Z800 Family history of malignant neoplasm of digestive organs: Secondary | ICD-10-CM

## 2022-09-13 DIAGNOSIS — D696 Thrombocytopenia, unspecified: Secondary | ICD-10-CM | POA: Diagnosis present

## 2022-09-13 DIAGNOSIS — K219 Gastro-esophageal reflux disease without esophagitis: Secondary | ICD-10-CM | POA: Diagnosis present

## 2022-09-13 DIAGNOSIS — I129 Hypertensive chronic kidney disease with stage 1 through stage 4 chronic kidney disease, or unspecified chronic kidney disease: Secondary | ICD-10-CM | POA: Diagnosis present

## 2022-09-13 DIAGNOSIS — F39 Unspecified mood [affective] disorder: Secondary | ICD-10-CM | POA: Diagnosis present

## 2022-09-13 DIAGNOSIS — E8770 Fluid overload, unspecified: Secondary | ICD-10-CM | POA: Diagnosis present

## 2022-09-13 DIAGNOSIS — D539 Nutritional anemia, unspecified: Secondary | ICD-10-CM | POA: Diagnosis present

## 2022-09-13 DIAGNOSIS — Z801 Family history of malignant neoplasm of trachea, bronchus and lung: Secondary | ICD-10-CM

## 2022-09-13 DIAGNOSIS — F17210 Nicotine dependence, cigarettes, uncomplicated: Secondary | ICD-10-CM | POA: Diagnosis present

## 2022-09-13 DIAGNOSIS — Z803 Family history of malignant neoplasm of breast: Secondary | ICD-10-CM

## 2022-09-13 DIAGNOSIS — F1021 Alcohol dependence, in remission: Secondary | ICD-10-CM | POA: Diagnosis present

## 2022-09-13 DIAGNOSIS — N189 Chronic kidney disease, unspecified: Secondary | ICD-10-CM | POA: Diagnosis present

## 2022-09-13 HISTORY — DX: Alcohol dependence, in remission: F10.21

## 2022-09-13 LAB — URINALYSIS, ROUTINE W REFLEX MICROSCOPIC
Bilirubin Urine: NEGATIVE
Glucose, UA: NEGATIVE mg/dL
Ketones, ur: NEGATIVE mg/dL
Leukocytes,Ua: NEGATIVE
Nitrite: NEGATIVE
Protein, ur: NEGATIVE mg/dL
Specific Gravity, Urine: 1.015 (ref 1.005–1.030)
pH: 5 (ref 5.0–8.0)

## 2022-09-13 LAB — COMPREHENSIVE METABOLIC PANEL
ALT: 24 U/L (ref 0–44)
AST: 62 U/L — ABNORMAL HIGH (ref 15–41)
Albumin: 1.9 g/dL — ABNORMAL LOW (ref 3.5–5.0)
Alkaline Phosphatase: 164 U/L — ABNORMAL HIGH (ref 38–126)
Anion gap: 8 (ref 5–15)
BUN: 24 mg/dL — ABNORMAL HIGH (ref 6–20)
CO2: 23 mmol/L (ref 22–32)
Calcium: 8.6 mg/dL — ABNORMAL LOW (ref 8.9–10.3)
Chloride: 105 mmol/L (ref 98–111)
Creatinine, Ser: 3.64 mg/dL — ABNORMAL HIGH (ref 0.44–1.00)
GFR, Estimated: 15 mL/min — ABNORMAL LOW (ref >60)
Glucose, Bld: 115 mg/dL — ABNORMAL HIGH (ref 70–99)
Potassium: 3.8 mmol/L (ref 3.5–5.1)
Sodium: 136 mmol/L (ref 135–145)
Total Bilirubin: 2 mg/dL — ABNORMAL HIGH (ref 0.3–1.2)
Total Protein: 7.6 g/dL (ref 6.5–8.1)

## 2022-09-13 LAB — CBC WITH DIFFERENTIAL/PLATELET
Abs Immature Granulocytes: 0.03 10*3/uL (ref 0.00–0.07)
Basophils Absolute: 0.1 10*3/uL (ref 0.0–0.1)
Basophils Relative: 1 %
Eosinophils Absolute: 0.1 10*3/uL (ref 0.0–0.5)
Eosinophils Relative: 1 %
HCT: 25.8 % — ABNORMAL LOW (ref 36.0–46.0)
Hemoglobin: 8.5 g/dL — ABNORMAL LOW (ref 12.0–15.0)
Immature Granulocytes: 0 %
Lymphocytes Relative: 35 %
Lymphs Abs: 2.7 10*3/uL (ref 0.7–4.0)
MCH: 33.6 pg (ref 26.0–34.0)
MCHC: 32.9 g/dL (ref 30.0–36.0)
MCV: 102 fL — ABNORMAL HIGH (ref 80.0–100.0)
Monocytes Absolute: 0.6 10*3/uL (ref 0.1–1.0)
Monocytes Relative: 7 %
Neutro Abs: 4.3 10*3/uL (ref 1.7–7.7)
Neutrophils Relative %: 56 %
Platelets: 102 10*3/uL — ABNORMAL LOW (ref 150–400)
RBC: 2.53 MIL/uL — ABNORMAL LOW (ref 3.87–5.11)
RDW: 14.1 % (ref 11.5–15.5)
WBC: 7.8 10*3/uL (ref 4.0–10.5)
nRBC: 0 % (ref 0.0–0.2)

## 2022-09-13 LAB — PROTIME-INR
INR: 1.6 — ABNORMAL HIGH (ref 0.8–1.2)
Prothrombin Time: 18.8 seconds — ABNORMAL HIGH (ref 11.4–15.2)

## 2022-09-13 LAB — LIPASE, BLOOD: Lipase: 37 U/L (ref 11–51)

## 2022-09-13 LAB — AMMONIA: Ammonia: 50 umol/L — ABNORMAL HIGH (ref 9–35)

## 2022-09-13 NOTE — ED Triage Notes (Addendum)
Pt reports she was told to come to the ED by her nephrologist due an elevated creatinine of 3.6, no hx of dialysis. She reports she does have a hx of kidney failure. She appears jaundiced. She also reports abd pain and swelling. Both legs and feet also appear edematous.

## 2022-09-13 NOTE — ED Provider Triage Note (Signed)
Emergency Medicine Provider Triage Evaluation Note  [name] , a 46 y.o. female  was evaluated in triage.  Pt complains of acute renal injury with a new creatinine of 3.6.  Patient has a history of alcoholic cirrhosis, abdomen is distended but nontender.  She is also for lower extremity edema on Lasix.  No confusion.  No changes in medication.  Review of Systems  Per HPI  Physical Exam  BP 115/63   Pulse 98   Temp 98.9 F (37.2 C) (Oral)   Resp 18   SpO2 96%  Gen:   Awake, no distress   Resp:  Normal effort  MSK:   Moves extremities without difficulty  Other:  Clear icterus, abdomen is distended but soft and nontender.  Lower extremity edema.  Medical Decision Making  Medically screening exam initiated at 10:05 PM.  Appropriate orders placed.  FRANCISCO EYERLY was informed that the remainder of the evaluation will be completed by another provider, this initial triage assessment does not replace that evaluation, and the importance of remaining in the ED until their evaluation is complete.     Theron Arista, PA-C 09/13/22 [id number]

## 2022-09-13 NOTE — Telephone Encounter (Signed)
Patient called in stating her nephrologist advised her to go to the ED today for a creatinine level of 3.6, and is wondering if she should go or not since she has an appointment with Dr. Chales Abrahams on Friday. Advised patient that she should follow her nephrologist recommendations & go to ED, and if needed to call back tomorrow and we will reschedule OV. Pt verbalized all understanding.

## 2022-09-13 NOTE — Telephone Encounter (Signed)
Patient called stating her kidney doctor told her, her levels were high, pt unsure if she should keep appointment or go to the ER. Please advise.

## 2022-09-14 ENCOUNTER — Encounter (HOSPITAL_COMMUNITY): Payer: Self-pay | Admitting: Internal Medicine

## 2022-09-14 ENCOUNTER — Inpatient Hospital Stay (HOSPITAL_COMMUNITY): Payer: Self-pay

## 2022-09-14 DIAGNOSIS — K767 Hepatorenal syndrome: Secondary | ICD-10-CM | POA: Diagnosis present

## 2022-09-14 DIAGNOSIS — N179 Acute kidney failure, unspecified: Secondary | ICD-10-CM

## 2022-09-14 DIAGNOSIS — K7031 Alcoholic cirrhosis of liver with ascites: Secondary | ICD-10-CM

## 2022-09-14 HISTORY — PX: IR PARACENTESIS: IMG2679

## 2022-09-14 LAB — LACTATE DEHYDROGENASE, PLEURAL OR PERITONEAL FLUID: LD, Fluid: 35 U/L — ABNORMAL HIGH (ref 3–23)

## 2022-09-14 LAB — CBC
HCT: 22 % — ABNORMAL LOW (ref 36.0–46.0)
Hemoglobin: 7.2 g/dL — ABNORMAL LOW (ref 12.0–15.0)
MCH: 33.6 pg (ref 26.0–34.0)
MCHC: 32.7 g/dL (ref 30.0–36.0)
MCV: 102.8 fL — ABNORMAL HIGH (ref 80.0–100.0)
Platelets: 112 10*3/uL — ABNORMAL LOW (ref 150–400)
RBC: 2.14 MIL/uL — ABNORMAL LOW (ref 3.87–5.11)
RDW: 14.2 % (ref 11.5–15.5)
WBC: 7.3 10*3/uL (ref 4.0–10.5)
nRBC: 0 % (ref 0.0–0.2)

## 2022-09-14 LAB — ALBUMIN, PLEURAL OR PERITONEAL FLUID: Albumin, Fluid: <1.5 g/dL

## 2022-09-14 LAB — PROTIME-INR
INR: 1.6 — ABNORMAL HIGH (ref 0.8–1.2)
Prothrombin Time: 18.5 seconds — ABNORMAL HIGH (ref 11.4–15.2)

## 2022-09-14 LAB — PROTEIN, PLEURAL OR PERITONEAL FLUID: Total protein, fluid: <3 g/dL

## 2022-09-14 LAB — COMPREHENSIVE METABOLIC PANEL
ALT: 24 U/L (ref 0–44)
AST: 63 U/L — ABNORMAL HIGH (ref 15–41)
Albumin: 1.9 g/dL — ABNORMAL LOW (ref 3.5–5.0)
Alkaline Phosphatase: 151 U/L — ABNORMAL HIGH (ref 38–126)
Anion gap: 8 (ref 5–15)
BUN: 23 mg/dL — ABNORMAL HIGH (ref 6–20)
CO2: 19 mmol/L — ABNORMAL LOW (ref 22–32)
Calcium: 8.6 mg/dL — ABNORMAL LOW (ref 8.9–10.3)
Chloride: 106 mmol/L (ref 98–111)
Creatinine, Ser: 3.48 mg/dL — ABNORMAL HIGH (ref 0.44–1.00)
GFR, Estimated: 16 mL/min — ABNORMAL LOW (ref >60)
Glucose, Bld: 94 mg/dL (ref 70–99)
Potassium: 3.7 mmol/L (ref 3.5–5.1)
Sodium: 133 mmol/L — ABNORMAL LOW (ref 135–145)
Total Bilirubin: 1.9 mg/dL — ABNORMAL HIGH (ref 0.3–1.2)
Total Protein: 6.9 g/dL (ref 6.5–8.1)

## 2022-09-14 LAB — BODY FLUID CELL COUNT WITH DIFFERENTIAL: Total Nucleated Cell Count, Fluid: 8 cu mm (ref 0–1000)

## 2022-09-14 LAB — GLUCOSE, PLEURAL OR PERITONEAL FLUID: Glucose, Fluid: 104 mg/dL

## 2022-09-14 LAB — BRAIN NATRIURETIC PEPTIDE: B Natriuretic Peptide: 128.2 pg/mL — ABNORMAL HIGH (ref 0.0–100.0)

## 2022-09-14 MED ORDER — MIDODRINE HCL 5 MG PO TABS
10.0000 mg | ORAL_TABLET | Freq: Three times a day (TID) | ORAL | Status: DC
  Administered 2022-09-14 – 2022-09-20 (×18): 10 mg via ORAL
  Filled 2022-09-14 (×19): qty 2

## 2022-09-14 MED ORDER — BISACODYL 5 MG PO TBEC: 5.0000 mg | DELAYED_RELEASE_TABLET | Freq: Every day | ORAL | Status: DC | PRN

## 2022-09-14 MED ORDER — LACTULOSE 10 GM/15ML PO SOLN: 30.0000 g | Freq: Two times a day (BID) | ORAL | Status: DC | PRN

## 2022-09-14 MED ORDER — LIDOCAINE HCL 1 % IJ SOLN
INTRAMUSCULAR | Status: AC
  Administered 2022-09-14: 10 mL
  Filled 2022-09-14: qty 20

## 2022-09-14 MED ORDER — SUCRALFATE 1 G PO TABS
1.0000 g | ORAL_TABLET | Freq: Two times a day (BID) | ORAL | Status: DC
  Administered 2022-09-14 – 2022-09-20 (×13): 1 g via ORAL
  Filled 2022-09-14 (×13): qty 1

## 2022-09-14 MED ORDER — SODIUM CHLORIDE 0.9% FLUSH
3.0000 mL | Freq: Two times a day (BID) | INTRAVENOUS | Status: DC
  Administered 2022-09-14 – 2022-09-21 (×14): 3 mL via INTRAVENOUS

## 2022-09-14 MED ORDER — ONDANSETRON HCL 4 MG PO TABS
4.0000 mg | ORAL_TABLET | Freq: Four times a day (QID) | ORAL | Status: DC | PRN
  Administered 2022-09-17 – 2022-09-18 (×3): 4 mg via ORAL
  Filled 2022-09-14 (×3): qty 1

## 2022-09-14 MED ORDER — OXYCODONE HCL 5 MG PO TABS
5.0000 mg | ORAL_TABLET | ORAL | Status: DC | PRN
  Administered 2022-09-14 – 2022-09-21 (×14): 5 mg via ORAL
  Filled 2022-09-14 (×14): qty 1

## 2022-09-14 MED ORDER — ALBUMIN HUMAN 25 % IV SOLN
50.0000 g | Freq: Four times a day (QID) | INTRAVENOUS | Status: AC
  Administered 2022-09-14 – 2022-09-15 (×3): 50 g via INTRAVENOUS
  Filled 2022-09-14 (×6): qty 200

## 2022-09-14 MED ORDER — SPIRONOLACTONE 25 MG PO TABS
50.0000 mg | ORAL_TABLET | Freq: Every day | ORAL | Status: DC
  Filled 2022-09-14 (×3): qty 2

## 2022-09-14 MED ORDER — POLYETHYLENE GLYCOL 3350 17 G PO PACK: 17.0000 g | PACK | Freq: Every day | ORAL | Status: DC | PRN

## 2022-09-14 MED ORDER — PANTOPRAZOLE SODIUM 40 MG PO TBEC
40.0000 mg | DELAYED_RELEASE_TABLET | Freq: Two times a day (BID) | ORAL | Status: DC
  Administered 2022-09-14 – 2022-09-21 (×15): 40 mg via ORAL
  Filled 2022-09-14 (×15): qty 1

## 2022-09-14 MED ORDER — NICOTINE 7 MG/24HR TD PT24
7.0000 mg | MEDICATED_PATCH | Freq: Every day | TRANSDERMAL | Status: DC
  Administered 2022-09-15 – 2022-09-21 (×7): 7 mg via TRANSDERMAL
  Filled 2022-09-14 (×8): qty 1

## 2022-09-14 MED ORDER — HYDRALAZINE HCL 20 MG/ML IJ SOLN: 5.0000 mg | INTRAMUSCULAR | Status: DC | PRN

## 2022-09-14 MED ORDER — OCTREOTIDE ACETATE 100 MCG/ML IJ SOLN
100.0000 ug | Freq: Three times a day (TID) | INTRAMUSCULAR | Status: DC
  Administered 2022-09-14 – 2022-09-19 (×14): 100 ug via SUBCUTANEOUS
  Filled 2022-09-14 (×18): qty 1

## 2022-09-14 MED ORDER — ONDANSETRON HCL 4 MG/2ML IJ SOLN
4.0000 mg | Freq: Four times a day (QID) | INTRAMUSCULAR | Status: DC | PRN
  Administered 2022-09-14 – 2022-09-21 (×14): 4 mg via INTRAVENOUS
  Filled 2022-09-14 (×14): qty 2

## 2022-09-14 MED ORDER — SERTRALINE HCL 50 MG PO TABS
50.0000 mg | ORAL_TABLET | Freq: Every day | ORAL | Status: DC
  Administered 2022-09-14 – 2022-09-17 (×4): 50 mg via ORAL
  Filled 2022-09-14 (×8): qty 1

## 2022-09-14 MED ORDER — BUSPIRONE HCL 5 MG PO TABS: 15.0000 mg | ORAL_TABLET | Freq: Every day | ORAL | Status: DC | PRN

## 2022-09-14 NOTE — Consult Note (Signed)
[name]  HISTORY AND PHYSICAL  [name] is an 46 y.o. female.    Chief Complaint: abnormal labs  HPI: Pt is a 81F with EtOH liver disease sent from [place] for abnormal labs.  Pt was doing well with Cr 1.3-1.4 and had appt on Monday with Dr Candiss Norse.  Noted Cr above 3.  Also developed ascites which is new for her.  In this setting we are asked to see.    Pt is scared and tearful.  Says she hasn't had any EtOH.  Was doing well until this happened.  Was on Lasix/ aldactone as OP.      PMH: Past Medical History:  Diagnosis Date   Acid reflux    Alcohol dependence in remission ([place])    Anemia    Ulcer    PSH: Past Surgical History:  Procedure Laterality Date   ABDOMINAL SURGERY     abdominoplasty   BIOPSY  04/18/2022   Procedure: BIOPSY;  Surgeon: Jackquline Denmark, MD;  Location: [facility] ENDOSCOPY;  Service: Gastroenterology;;   BREAST BIOPSY Right 2013   CHOLECYSTECTOMY N/A 11/23/2019   Procedure: LAPAROSCOPIC CHOLECYSTECTOMY WITH INTRAOPERATIVE CHOLANGIOGRAM;  Surgeon: Mickeal Skinner, MD;  Location: [place];  Service: General;  Laterality: N/A;   COSMETIC SURGERY     tummy tuck   ESOPHAGOGASTRODUODENOSCOPY (EGD) WITH PROPOFOL N/A 04/18/2022   Procedure: ESOPHAGOGASTRODUODENOSCOPY (EGD) WITH PROPOFOL;  Surgeon: Jackquline Denmark, MD;  Location: [facility];  Service: Gastroenterology;  Laterality: N/A;   FOOT SURGERY     HERNIA REPAIR     I & D EXTREMITY Left 06/21/2022   Procedure: IRRIGATION AND DEBRIDEMENT EXTREMITY Debridement of Left Foot/APPLICATION OF GRAFT;  Surgeon: Yevonne Pax, DPM;  Location: [facility];  Service: Podiatry;  Laterality: Left;   ROUX-EN-Y PROCEDURE     TUMOR REMOVAL     WRIST GANGLION EXCISION       Past Medical History:  Diagnosis Date   Acid reflux    Alcohol dependence in remission ([place])    Anemia    Ulcer     Medications: Reviewed in epic and include Lasix/ spironolactone   ALLERGIES:   Allergies  Allergen  Reactions   Tape Rash    Clear tape    FAM HX: Family History  Problem Relation Age of Onset   Cancer Mother        kidney   Cancer Paternal Aunt    Breast cancer Maternal Aunt    Pancreatic cancer Maternal Aunt    Lung cancer Paternal Uncle     Social History:   reports that she has been smoking cigarettes. She has a 6.25 pack-year smoking history. She has been exposed to tobacco smoke. She has never used smokeless tobacco. She reports that she does not currently use alcohol. She reports that she does not use drugs.  ROS: ROS: all other systems reviewed and are negative except as per HPI  Blood pressure (!) 101/52, pulse 90, temperature 98.1 F (36.7 C), resp. rate 13, height 5\' 6"  (1.676 m), weight 83.5 kg, last menstrual period 03/10/2022, SpO2 100 %. PHYSICAL EXAM: Physical Exam GEN NAD, tearful HEENT EMOI PERRL NECK no overt JVD PULM clear CV RRR ABD distende dwith + fluid wave EXT 2+ LE edema NEURO AAO x 3, no asterixis MSK gauze on L ankle   Results for orders placed or performed during the hospital encounter of 09/13/22 (from the past 48 hour(s))  Urinalysis, Routine w reflex microscopic     Status:  Abnormal   Collection Time: 09/13/22 10:01 PM  Result Value Ref Range   Color, Urine YELLOW YELLOW   APPearance HAZY (A) CLEAR   Specific Gravity, Urine 1.015 1.005 - 1.030   pH 5.0 5.0 - 8.0   Glucose, UA NEGATIVE NEGATIVE mg/dL   Hgb urine dipstick SMALL (A) NEGATIVE   Bilirubin Urine NEGATIVE NEGATIVE   Ketones, ur NEGATIVE NEGATIVE mg/dL   Protein, ur NEGATIVE NEGATIVE mg/dL   Nitrite NEGATIVE NEGATIVE   Leukocytes,Ua NEGATIVE NEGATIVE   RBC / HPF 0-5 0 - 5 RBC/hpf   WBC, UA 0-5 0 - 5 WBC/hpf   Bacteria, UA RARE (A) NONE SEEN   Squamous Epithelial / LPF 0-5 0 - 5   Mucus PRESENT    Hyaline Casts, UA PRESENT     Comment: Performed at [facility], [address], [place], [place] [address]  CBC with Differential     Status: Abnormal   Collection  Time: 09/13/22 10:23 PM  Result Value Ref Range   WBC 7.8 4.0 - 10.5 K/uL   RBC 2.53 (L) 3.87 - 5.11 MIL/uL   Hemoglobin 8.5 (L) 12.0 - 15.0 g/dL   HCT 28.3 (L) 15.1 - 76.1 %   MCV 102.0 (H) 80.0 - 100.0 fL   MCH 33.6 26.0 - 34.0 pg   MCHC 32.9 30.0 - 36.0 g/dL   RDW 60.7 37.1 - 06.2 %   Platelets 102 (L) 150 - 400 K/uL    Comment: Immature Platelet Fraction may be clinically indicated, consider ordering this additional test [id number] REPEATED TO VERIFY    nRBC 0.0 0.0 - 0.2 %   Neutrophils Relative % 56 %   Neutro Abs 4.3 1.7 - 7.7 K/uL   Lymphocytes Relative 35 %   Lymphs Abs 2.7 0.7 - 4.0 K/uL   Monocytes Relative 7 %   Monocytes Absolute 0.6 0.1 - 1.0 K/uL   Eosinophils Relative 1 %   Eosinophils Absolute 0.1 0.0 - 0.5 K/uL   Basophils Relative 1 %   Basophils Absolute 0.1 0.0 - 0.1 K/uL   Immature Granulocytes 0 %   Abs Immature Granulocytes 0.03 0.00 - 0.07 K/uL    Comment: Performed at [facility], [address] [place], [place] [address]  Comprehensive metabolic panel     Status: Abnormal   Collection Time: 09/13/22 10:23 PM  Result Value Ref Range   Sodium 136 135 - 145 mmol/L   Potassium 3.8 3.5 - 5.1 mmol/L   Chloride 105 98 - 111 mmol/L   CO2 23 22 - 32 mmol/L   Glucose, Bld 115 (H) 70 - 99 mg/dL    Comment: Glucose reference range applies only to samples taken after fasting for at least 8 hours.   BUN 24 (H) 6 - 20 mg/dL   Creatinine, Ser 0.93 (H) 0.44 - 1.00 mg/dL   Calcium 8.6 (L) 8.9 - 10.3 mg/dL   Total Protein 7.6 6.5 - 8.1 g/dL   Albumin 1.9 (L) 3.5 - 5.0 g/dL   AST 62 (H) 15 - 41 U/L   ALT 24 0 - 44 U/L   Alkaline Phosphatase 164 (H) 38 - 126 U/L   Total Bilirubin 2.0 (H) 0.3 - 1.2 mg/dL   GFR, Estimated 15 (L) >60 mL/min    Comment: (NOTE) Calculated using the CKD-EPI Creatinine Equation (2021)    Anion gap 8 5 - 15    Comment: Performed at [facility], [address] [place],  [place] [address]  Lipase, blood     Status: None    Collection Time: 09/13/22 10:23 PM  Result Value Ref Range   Lipase 37 11 - 51 U/L    Comment: Performed at [facility], [place] [address], [place], [place] [address]  Protime-INR     Status: Abnormal   Collection Time: 09/13/22 10:23 PM  Result Value Ref Range   Prothrombin Time 18.8 (H) 11.4 - 15.2 seconds   INR 1.6 (H) 0.8 - 1.2    Comment: (NOTE) INR goal varies based on device and disease states. Performed at [facility], [place] [address] [place], [place] [address]   Brain natriuretic peptide     Status: Abnormal   Collection Time: 09/13/22 10:23 PM  Result Value Ref Range   B Natriuretic Peptide 128.2 (H) 0.0 - 100.0 pg/mL    Comment: Performed at [place] [address] [place], [place] [address]  Ammonia     Status: Abnormal   Collection Time: 09/13/22 10:23 PM  Result Value Ref Range   Ammonia 50 (H) 9 - 35 umol/L    Comment: Performed at [facility], [place] [address], [place], [place] [address]    US Renal  Result Date: 09/13/2022 CLINICAL DATA:  Acute kidney injury EXAM: RENAL / URINARY TRACT ULTRASOUND COMPLETE COMPARISON:  CT 04/10/2022 FINDINGS: Right Kidney: Renal measurements: 11.2 x 4.4 x 4.7 cm = volume: 121.6 mL. Echogenicity within normal limits. No mass or hydronephrosis visualized. Left Kidney: Renal measurements: 10.7 x 4.8 x 3.9 cm = volume: 104.2 mL. Echogenicity within normal limits. No mass or hydronephrosis visualized. Bladder: Appears distended with particulate debris. Other: Large volume of ascites within the abdomen and pelvis. Cirrhotic morphology of the liver. IMPRESSION: 1. Negative for hydronephrosis. 2. Large volume ascites. 3. Cirrhotic morphology of the liver. Electronically Signed   By: Donavan Foil M.D.   On: 09/13/2022 23:38    Assessment/Plan  AKI on CKD: BP low and new ascites and decomp cirrhosis - UA bland - needs HRS rx- midodrine/ octreotide/ albumin - try this before terlipressin - GI seeing,  appreciate - no more than 4L LVP if possible - holding lasix/ aldactone  2.  Decomp cirrhosis  - ammonia 50- CTM  - HRS as above  - possible transfer to tertiary care center  3.  Anemia:  -chekcing iron  - IV if needed  4.  GI ulcers  - PPI  5.  Dispo: trying to get transferred to tertiary care center  Madelon Lips 09/14/2022, 10:37 AM

## 2022-09-14 NOTE — Consult Note (Signed)
Consultation  Referring Provider:     Jonah Blue, MD Primary Care Physician:  [name], NP Primary Gastroenterologist: Dr. Chales Abrahams        Reason for Consultation:  Cirrhosis, AKI, ascites, abdominal distention         HPI:   [name] is a 46 y.o. 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  She reports having increasing abdominal distention and bilateral lower extremity edema over the last week.  Had been taking her Lasix 40 mg/day and Aldactone 50 mg/day as prescribed, but held the spironolactone a couple days ago and reports being told to increase her Lasix to 80 mg/day earlier this week.  No prior history of requiring paracentesis.  Last EGD in 03/2022 was without esophageal varices.  Last seen in the GI clinic by Dr. Chales Abrahams on 07/18/2022 and labs at that time notable for elevated AFP (9), GGT (79), and mildly reduced ceruloplasmin (19).  Was referred for MRI liver (not yet done), 24-hour copper collection, and referral to [facility] for transplant evaluation (MELD 3.0 was 23).  Labs also notable for elevated iron with ferritin 7064, iron 97, TIBC 104, sat 94%.  Negative/normal AMA, A1 AT, celiac panel.  Baseline BUN/creatinine in 06/2022 was 10/1.4.  Baseline hemoglobin ~10-11.   Past GI procedures   EGD 04/18/2022 - Esophageal plaques were found, suspicious for candidiasis. - Small hiatal hernia. - Roux-en-Y gastrojejunostomy with moderate marginal ulcer. Biopsied. - Treated with Diflucan, Protonix twice daily, Carafate   Patient never had colonoscopy.   [place] 04/26/2022 1. Hepatic steatosis. Mildly nodular  liver contour, concerning for cirrhosis. 2. Trace perihepatic ascites.   CT AP without contrast 04/10/2022 1. Severe fatty liver. 2. Artifact versus less likely mild acute pancreatitis. Correlation with pancreatic enzymes recommended. 3. No bowel obstruction. Normal appendix. 4. Aortic Atherosclerosis (ICD10-I70.0).   Multiple previous EGDs.  Twice 2011 (multiple small ulcers at jejunal side of pouch with normal gastric pouch.  Twice in 2012 (anastomotic ulcer), once 2013 (anastomotic ulcer 15 mm sized), twice 2014, once 2017 (report not found) . Latest EGD was 09/2018 (only path reprt found: Gastric mucosa with no diagnostic abnormality, no H. Pylori)      Past Medical History:  Diagnosis Date   Acid reflux    Alcohol dependence in remission (HCC)    Anemia    Ulcer     Past Surgical History:  Procedure Laterality Date   ABDOMINAL SURGERY     abdominoplasty   BIOPSY  04/18/2022   Procedure: BIOPSY;  Surgeon: Lynann Bologna, MD;  Location: [facility] ENDOSCOPY;  Service: Gastroenterology;;   BREAST BIOPSY Right 2013   CHOLECYSTECTOMY N/A 11/23/2019   Procedure: LAPAROSCOPIC CHOLECYSTECTOMY WITH INTRAOPERATIVE CHOLANGIOGRAM;  Surgeon: Rodman Pickle, MD;  Location: [facility] OR;  Service: General;  Laterality: N/A;   COSMETIC SURGERY     tummy tuck   ESOPHAGOGASTRODUODENOSCOPY (EGD) WITH PROPOFOL N/A 04/18/2022   Procedure: ESOPHAGOGASTRODUODENOSCOPY (EGD) WITH PROPOFOL;  Surgeon: Lynann Bologna, MD;  Location: [facility] ENDOSCOPY;  Service: Gastroenterology;  Laterality: N/A;   FOOT SURGERY     HERNIA REPAIR     I & D EXTREMITY Left 06/21/2022   Procedure: IRRIGATION  AND DEBRIDEMENT EXTREMITY Debridement of Left Foot/APPLICATION OF GRAFT;  Surgeon: Pilar Plate, DPM;  Location: [facility] ORS;  Service: Podiatry;  Laterality: Left;   ROUX-EN-Y PROCEDURE     TUMOR REMOVAL     WRIST GANGLION EXCISION      Family History  Problem Relation Age of Onset   Cancer Mother        kidney    Cancer Paternal Aunt    Breast cancer Maternal Aunt    Pancreatic cancer Maternal Aunt    Lung cancer Paternal Uncle      Social History   Tobacco Use   Smoking status: Every Day    Packs/day: 0.25    Years: 25.00    Total pack years: 6.25    Types: Cigarettes    Passive exposure: Current   Smokeless tobacco: Never   Tobacco comments:    2-3 cigs per day currently  Vaping Use   Vaping Use: Never used  Substance Use Topics   Alcohol use: Not Currently    Comment: previously consumed wine -> 1/2 pint [employer] per night   Drug use: No    Prior to Admission medications   Medication Sig Start Date End Date Taking? Authorizing Provider  acetaminophen (TYLENOL) 500 MG tablet Take 500 mg by mouth every 6 (six) hours as needed for mild pain.   Yes [provider]  busPIRone (BUSPAR) 15 MG tablet Take 15 mg by mouth daily as needed (anxiety). 06/09/21  Yes [provider]  Cyanocobalamin (B-12 PO) Take 1 tablet by mouth daily.   Yes [provider]  ferrous sulfate 325 (65 FE) MG tablet Take 1 tablet (325 mg total) by mouth daily with breakfast. 05/16/22  Yes [name], NP  folic acid (FOLVITE) 1 MG tablet Take 1 tablet (1 mg total) by mouth daily. 04/22/22  Yes Danford, Earl Lites, MD  furosemide (LASIX) 20 MG tablet Take 1 tablet (20 mg total) by mouth daily. 06/23/22 06/23/23 Yes Almon Hercules, MD  lactulose (CHRONULAC) 10 GM/15ML solution Take 30 mLs (20 g total) by mouth 3 (three) times daily as needed for mild constipation or moderate constipation. 06/23/22  Yes Almon Hercules, MD  loperamide (IMODIUM) 2 MG capsule Take 1 capsule (2 mg total) by mouth as needed for diarrhea or loose stools. Patient taking differently: Take 2 mg by mouth daily as needed for diarrhea or loose stools. 04/22/22  Yes Danford, Earl Lites, MD  Multiple Vitamin (MULTIVITAMIN) tablet Take 1 tablet by mouth daily.   Yes [provider]  ondansetron (ZOFRAN) 8 MG tablet  Take 1 tablet (8 mg total) by mouth every 8 (eight) hours as needed. 07/12/22  Yes Jaci Standard, MD  pantoprazole (PROTONIX) 40 MG tablet Take 1 tablet (40 mg total) by mouth 2 (two) times daily before a meal. 04/22/22  Yes Danford, Earl Lites, MD  sertraline (ZOLOFT) 50 MG tablet Take 50 mg by mouth daily.   Yes [provider]  spironolactone (ALDACTONE) 50 MG tablet TAKE 1 TABLET BY MOUTH EVERY DAY 08/09/22  Yes [name], NP  sucralfate (CARAFATE) 1 g tablet Take 1 tablet (1 g total) by mouth 2 (two) times daily. 08/14/22  Yes Lynann Bologna, MD  nicotine polacrilex (NICORETTE) 2 MG gum Take 1 each (2 mg total) by mouth as needed for smoking cessation. Patient not taking: Reported on 09/13/2022 07/03/22   Danelle Earthly, MD    No current facility-administered medications for this encounter.  Current Outpatient Medications  Medication Sig Dispense Refill   acetaminophen (TYLENOL) 500 MG tablet Take 500 mg by mouth every 6 (six) hours as needed for mild pain.     busPIRone (BUSPAR) 15 MG tablet Take 15 mg by mouth daily as needed (anxiety).     Cyanocobalamin (B-12 PO) Take 1 tablet by mouth daily.     ferrous sulfate 325 (65 FE) MG tablet Take 1 tablet (325 mg total) by mouth daily with breakfast. 90 tablet 1   folic acid (FOLVITE) 1 MG tablet Take 1 tablet (1 mg total) by mouth daily. 30 tablet 6   furosemide (LASIX) 20 MG tablet Take 1 tablet (20 mg total) by mouth daily. 30 tablet 11   lactulose (CHRONULAC) 10 GM/15ML solution Take 30 mLs (20 g total) by mouth 3 (three) times daily as needed for mild constipation or moderate constipation. 473 mL 1   loperamide (IMODIUM) 2 MG capsule Take 1 capsule (2 mg total) by mouth as needed for diarrhea or loose stools. (Patient taking differently: Take 2 mg by mouth daily as needed for diarrhea or loose stools.) 30 capsule 0   Multiple Vitamin (MULTIVITAMIN) tablet Take 1 tablet by mouth daily.     ondansetron (ZOFRAN) 8 MG  tablet Take 1 tablet (8 mg total) by mouth every 8 (eight) hours as needed. 30 tablet 0   pantoprazole (PROTONIX) 40 MG tablet Take 1 tablet (40 mg total) by mouth 2 (two) times daily before a meal. 60 tablet 1   sertraline (ZOLOFT) 50 MG tablet Take 50 mg by mouth daily.     spironolactone (ALDACTONE) 50 MG tablet TAKE 1 TABLET BY MOUTH EVERY DAY 90 tablet 1   sucralfate (CARAFATE) 1 g tablet Take 1 tablet (1 g total) by mouth 2 (two) times daily. 28 tablet 0   nicotine polacrilex (NICORETTE) 2 MG gum Take 1 each (2 mg total) by mouth as needed for smoking cessation. (Patient not taking: Reported on 09/13/2022) 100 tablet 0    Allergies as of 09/13/2022 - Review Complete 09/13/2022  Allergen Reaction Noted   Tape Rash 04/11/2022     Review of Systems:    As per HPI, otherwise negative    Physical Exam:  Vital signs in last 24 hours: Temp:  [98 F (36.7 C)-98.9 F (37.2 C)] 98 F (36.7 C) (12/21 0539) Pulse Rate:  [70-98] 93 (12/21 0900) Resp:  [13-20] 13 (12/21 0900) BP: (98-122)/(50-82) 104/60 (12/21 0900) SpO2:  [96 %-100 %] 98 % (12/21 0900) Weight:  [83.5 kg] 83.5 kg (12/20 2212)   General:   Pleasant female in NAD Head:  Normocephalic and atraumatic. Lungs:  Respirations even and unlabored. Lungs clear to auscultation bilaterally.   No wheezes, crackles, or rhonchi.  Heart:  Regular rate and rhythm; no MRG Abdomen:  + Fluid wave with distended, nontense abdomen.  Soft, nontender. Normal bowel sounds.  Extremities: 3+ pitting edema bilateral lower extremities Neurologic:  Alert and  oriented x4;  grossly normal neurologically. Skin:  Intact without significant lesions or rashes. Psych:  Alert and cooperative. Normal affect.  LAB RESULTS: Recent Labs    09/13/22 2223  WBC 7.8  HGB 8.5*  HCT 25.8*  PLT 102*   BMET Recent Labs    09/13/22 2223  NA 136  K 3.8  CL 105  CO2 23  GLUCOSE 115*  BUN 24*  CREATININE 3.64*  CALCIUM 8.6*   LFT Recent Labs     09/13/22 2223  PROT 7.6  ALBUMIN 1.9*  AST 62*  ALT 24  ALKPHOS 164*  BILITOT 2.0*   PT/INR Recent Labs    09/13/22 2223  LABPROT 18.8*  INR 1.6*    STUDIES: US Renal  Result Date: 09/13/2022 CLINICAL DATA:  Acute kidney injury EXAM: RENAL / URINARY TRACT ULTRASOUND COMPLETE COMPARISON:  CT 04/10/2022 FINDINGS: Right Kidney: Renal measurements: 11.2 x 4.4 x 4.7 cm = volume: 121.6 mL. Echogenicity within normal limits. No mass or hydronephrosis visualized. Left Kidney: Renal measurements: 10.7 x 4.8 x 3.9 cm = volume: 104.2 mL. Echogenicity within normal limits. No mass or hydronephrosis visualized. Bladder: Appears distended with particulate debris. Other: Large volume of ascites within the abdomen and pelvis. Cirrhotic morphology of the liver. IMPRESSION: 1. Negative for hydronephrosis. 2. Large volume ascites. 3. Cirrhotic morphology of the liver. Electronically Signed   By: Jasmine PangKim  Fujinaga M.D.   On: 09/13/2022 23:38       Impression / Plan:   1) EtOH cirrhosis 2) Abdominal ascites 3) Thrombocytopenia 4) Coagulopathy  MELD 3.0 is 27 CTP C  - Hold Lasix/spironolactone due to AKI on CKD as below - Will need MRI liver due to elevated AFP as outpatient - Paracentesis today for diagnostic and therapeutic intent.  Please send fluid for cell count, Gram stain, culture, albumin, protein - Repeat iron studies - [facility], but not yet seen (scheduled for appointment next month).  Will discuss with transplant centers about transfer - Most recent EGD in 03/2022 without esophageal varices.  She is otherwise without e/o overt bleeding - Continue trending daily labs - I do not see a previous diagnosis of hepatic encephalopathy, but is prescribed lactulose as an outpatient.  Ammonia 50 on admission, but no asterixis.  Will continue to monitor  5) AKI on CKD - Baseline creatinine ~1.4, now 3.6 on admission.  Unclear if this is ATN vs  HRS. - Nephrology service consulted - Will await Nephrology recommendations regarding midodrine and octreotide, but would benefit from albumin 1 g/kilogram x2 days - Continue trending - Limit volume removed during paracentesis so to not precipitate too much volume shifting and subsequent prerenal azotemia  6) Iron deficiency anemia - Was taking oral iron as outpatient.  Follows in the Hematology clinic - Recheck iron studies with IV iron as needed per protocol - Trend daily CBC.  Baseline Hgb appears to be ~10-11 as outpt  7) Marginal ulcers - Resume Protonix 40 mg twice daily  GI service will continue to follow   Doristine LocksVito Shimika Ames, DO, [facility]       LOS: 0 days   Shellia CleverlyVito V Cheston Coury  09/14/2022, 9:14 AM

## 2022-09-14 NOTE — ED Provider Notes (Addendum)
[facility] EMERGENCY DEPARTMENT Provider Note   CSN: [id number] Arrival date & time: 09/13/22  2038     History  Chief Complaint  Patient presents with   Abnormal Lab    [name] is a 46 y.o. female.  [name] is a 46 y.o. female with history of alcoholic cirrhosis, gastric ulcer, iron deficiency anemia, who presents to the emergency department for evaluation of elevated creatinine.  Patient was seen by her nephrologist, Dr. Candiss Norse for routine follow-up on Monday and was called yesterday and told that her creatinine has gotten significantly worse now at 3.6 was previously 1.4 a month ago.  Patient has never been on dialysis.  Patient also reports that over the past week she has been having increasing swelling of her abdomen she reports it was initially not painful but is now uncomfortable and feels tight.  She has not had any blood in her stool or any vomiting.  No chest pain or shortness of breath, no fevers or chills.  She also reports worsening swelling in her lower legs.  She has never had paracentesis previously.  Is followed with Dr. Lyndel Safe with [facility], has been referred to hepatology at [facility], but has not yet been seen. Last drink in June.  The history is provided by the patient.  Abnormal Lab      Home Medications Prior to Admission medications   Medication Sig Start Date End Date Taking? Authorizing Provider  acetaminophen (TYLENOL) 500 MG tablet Take 500 mg by mouth every 6 (six) hours as needed for mild pain.   Yes [provider]  busPIRone (BUSPAR) 15 MG tablet Take 15 mg by mouth daily as needed (anxiety). 06/09/21  Yes [provider]  Cyanocobalamin (B-12 PO) Take 1 tablet by mouth daily.   Yes [provider]  ferrous sulfate 325 (65 FE) MG tablet Take 1 tablet (325 mg total) by mouth daily with breakfast. 05/16/22  Yes Kerin Perna, NP  folic acid (FOLVITE) 1 MG tablet Take 1 tablet (1 mg  total) by mouth daily. 04/22/22  Yes Danford, Suann Larry, MD  furosemide (LASIX) 20 MG tablet Take 1 tablet (20 mg total) by mouth daily. 06/23/22 06/23/23 Yes Mercy Riding, MD  lactulose (CHRONULAC) 10 GM/15ML solution Take 30 mLs (20 g total) by mouth 3 (three) times daily as needed for mild constipation or moderate constipation. 06/23/22  Yes Mercy Riding, MD  loperamide (IMODIUM) 2 MG capsule Take 1 capsule (2 mg total) by mouth as needed for diarrhea or loose stools. Patient taking differently: Take 2 mg by mouth daily as needed for diarrhea or loose stools. 04/22/22  Yes Danford, Suann Larry, MD  Multiple Vitamin (MULTIVITAMIN) tablet Take 1 tablet by mouth daily.   Yes [provider]  ondansetron (ZOFRAN) 8 MG tablet Take 1 tablet (8 mg total) by mouth every 8 (eight) hours as needed. 07/12/22  Yes Orson Slick, MD  pantoprazole (PROTONIX) 40 MG tablet Take 1 tablet (40 mg total) by mouth 2 (two) times daily before a meal. 04/22/22  Yes Danford, Suann Larry, MD  sertraline (ZOLOFT) 50 MG tablet Take 50 mg by mouth daily.   Yes [provider]  spironolactone (ALDACTONE) 50 MG tablet TAKE 1 TABLET BY MOUTH EVERY DAY 08/09/22  Yes Kerin Perna, NP  sucralfate (CARAFATE) 1 g tablet Take 1 tablet (1 g total) by mouth 2 (two) times daily. 08/14/22  Yes Jackquline Denmark, MD  nicotine polacrilex (NICORETTE) 2 MG gum Take 1 each (2 mg total) by mouth as needed for smoking cessation. Patient not taking: Reported on 09/13/2022 07/03/22   Danelle Earthly, MD      Allergies    Tape    Review of Systems   Review of Systems  Constitutional:  Negative for chills and fever.  HENT: Negative.    Respiratory:  Negative for cough.   Cardiovascular:  Positive for leg swelling. Negative for chest pain.  Gastrointestinal:  Positive for abdominal distention and abdominal pain. Negative for blood in stool, nausea and vomiting.  Genitourinary:  Negative for dysuria and frequency.   Neurological:  Positive for light-headedness. Negative for dizziness and syncope.  All other systems reviewed and are negative.   Physical Exam Updated Vital Signs BP (!) 109/55   Pulse 70   Temp 98 F (36.7 C) (Oral)   Resp 16   Ht 5\' 6"  (1.676 m)   Wt 83.5 kg   LMP 03/10/2022 (Approximate)   SpO2 100%   BMI 29.70 kg/m  Physical Exam Vitals and nursing note reviewed.  Constitutional:      General: She is not in acute distress.    Appearance: Normal appearance. She is well-developed. She is not ill-appearing or diaphoretic.     Comments: Alert, chronically ill appearing with no acute distress  HENT:     Head: Normocephalic and atraumatic.     Mouth/Throat:     Mouth: Mucous membranes are moist.     Pharynx: Oropharynx is clear.  Eyes:     General:        Right eye: No discharge.        Left eye: No discharge.     Comments: Scleral icterus  Cardiovascular:     Rate and Rhythm: Normal rate and regular rhythm.     Heart sounds: Normal heart sounds.  Pulmonary:     Effort: Pulmonary effort is normal. No respiratory distress.     Breath sounds: Normal breath sounds. No wheezing, rhonchi or rales.  Abdominal:     General: There is distension.     Comments: Abdomen is distended with fluid wave present, no guarding or peritoneal signs  Musculoskeletal:     Right lower leg: Edema present.     Left lower leg: Edema present.  Skin:    General: Skin is warm and dry.  Neurological:     Mental Status: She is alert and oriented to person, place, and time.     Coordination: Coordination normal.     Comments: Speech is clear, able to follow commands Moves extremities without ataxia, coordination intact  Psychiatric:        Mood and Affect: Mood normal.        Behavior: Behavior normal.     ED Results / Procedures / Treatments   Labs (all labs ordered are listed, but only abnormal results are displayed) Labs Reviewed  CBC WITH DIFFERENTIAL/PLATELET - Abnormal; Notable  for the following components:      Result Value   RBC 2.53 (*)    Hemoglobin 8.5 (*)    HCT 25.8 (*)    MCV 102.0 (*)    Platelets 102 (*)    All other components within normal limits  COMPREHENSIVE METABOLIC PANEL - Abnormal; Notable for the following components:   Glucose, Bld 115 (*)    BUN 24 (*)    Creatinine, Ser 3.64 (*)    Calcium 8.6 (*)    Albumin 1.9 (*)  AST 62 (*)    Alkaline Phosphatase 164 (*)    Total Bilirubin 2.0 (*)    GFR, Estimated 15 (*)    All other components within normal limits  URINALYSIS, ROUTINE W REFLEX MICROSCOPIC - Abnormal; Notable for the following components:   APPearance HAZY (*)    Hgb urine dipstick SMALL (*)    Bacteria, UA RARE (*)    All other components within normal limits  PROTIME-INR - Abnormal; Notable for the following components:   Prothrombin Time 18.8 (*)    INR 1.6 (*)    All other components within normal limits  BRAIN NATRIURETIC PEPTIDE - Abnormal; Notable for the following components:   B Natriuretic Peptide 128.2 (*)    All other components within normal limits  AMMONIA - Abnormal; Notable for the following components:   Ammonia 50 (*)    All other components within normal limits  LIPASE, BLOOD    EKG None  Radiology US Renal  Result Date: 09/13/2022 CLINICAL DATA:  Acute kidney injury EXAM: RENAL / URINARY TRACT ULTRASOUND COMPLETE COMPARISON:  CT 04/10/2022 FINDINGS: Right Kidney: Renal measurements: 11.2 x 4.4 x 4.7 cm = volume: 121.6 mL. Echogenicity within normal limits. No mass or hydronephrosis visualized. Left Kidney: Renal measurements: 10.7 x 4.8 x 3.9 cm = volume: 104.2 mL. Echogenicity within normal limits. No mass or hydronephrosis visualized. Bladder: Appears distended with particulate debris. Other: Large volume of ascites within the abdomen and pelvis. Cirrhotic morphology of the liver. IMPRESSION: 1. Negative for hydronephrosis. 2. Large volume ascites. 3. Cirrhotic morphology of the liver.  Electronically Signed   By: Donavan Foil M.D.   On: 09/13/2022 23:38    Procedures Procedures    Medications Ordered in ED Medications - No data to display  ED Course/ Medical Decision Making/ A&P                           Medical Decision Making Risk Decision regarding hospitalization.   46 y.o. female presents to the ED with complaints of worsening kidney function, this involves an extensive number of treatment options, and is a complaint that carries with it a high risk of complications and morbidity.  The differential diagnosis includes AKI due to dehydration, hypovolemia, electrolyte derangement, hepatorenal syndrome  On arrival pt is nontoxic, vitals normal, with a few borderline soft blood pressures. Exam significant for abdominal distention with fluid wave, no peritoneal signs  Additional history obtained from chart review. Previous records obtained and reviewed including prior labs and GI follow-up with Dr. Lyndel Safe.  Urine  Lab Tests:  I Ordered, reviewed, and interpreted labs, which included: No leukocytosis, chronic anemia with hemoglobin of 8 5, creatinine acutely worsened at 3.64 was previously 1.4, fortunately normal potassium, bili of 2.0, INR is elevated at 1.6 with underlying cirrhosis, ammonia 50 but patient is not encephalopathic, UA without any signs of infection.   Imaging Studies ordered:  Renal ultrasound ordered from triage, I independently visualized and interpreted imaging which showed no hydronephrosis, large volume ascites  ED Course:   Patient with worsening renal function I suspect this is in the setting of worsening cirrhosis with new ascites, patient has never required paracentesis before but has a distended and swollen abdomen with large volume ascites noted on renal ultrasound.  Suspect third spacing has led to prerenal AKI.  Nonemergent consult placed to [facility], discussed with Dr. Silverio Decamp and Dr. Bryan Lemma and they will see patient in consult,  feel patient would likely benefit from paracentesis.  I consulted and discussed case with Dr. Hollie Salk on call for inpatient nephrology today and they will see patient in consult as well.  Consult placed for unassigned medicine admission for AKI likely in the setting of hepatorenal syndrome.  Case discussed with Dr. Lorin Mercy with [facility], discussed whether pt needs to be transferred for transport evaluation. Discussed further with hospitalist, GI and Nephrology, pt with MELD score of 27. Will try to transfer, but if outside transplant centers without capacity will care for pt here.  [facility] unable to accept pt. Call placed to [facility] to discuss tranfer for transplant eval, awaiting call back.  Case discussed with Dr. Charlean Sanfilippo at [facility], unable to accept patient for transplant evaluation at this time, recommends paracentesis and blood cultures to rule out infection and giving albumin.   Notified hospitalist, GI and nephrology that patient has not been accepted for transfer, they will place admission orders and care for patient here, hospitalist will order paracentesis and diagnostic studies.  Portions of this note were generated with [employer]. Dictation errors may occur despite best attempts at proofreading.     Final Clinical Impression(s) / ED Diagnoses Final diagnoses:  Acute kidney injury ([facility])  Alcoholic cirrhosis of liver with ascites [facility])    Rx / DC Orders ED Discharge Orders     None         Jacqlyn Larsen, PA-C 09/14/22 0816    Jacqlyn Larsen, PA-C 09/14/22 1208    Gareth Morgan, MD 09/15/22 [phone]

## 2022-09-14 NOTE — Progress Notes (Signed)
   09/14/22 1250  Clinical Encounter Type  Visited With Patient  Visit Type Initial;Spiritual support  Referral From Chaplain  Consult/Referral To Chaplain   Chaplain responded to a request for a visit. The patient was happy to see me and appreciative of my visit. She shared that she is staying for a few days. The patient, [name], is hopeful that she body can be healed. I encouraged her to get some rest between the poking and testing. She laughed understanding that is a tuff thing to do sometimes. Raffaella has a sense of humor but is also teary. She knows she is facing something but not sure what, at this point. That is scary. I prayed for her and will continue to monitor her.   [facility]  [phone]

## 2022-09-14 NOTE — H&P (Signed)
History and Physical    Patient: [name] NUU:[id number] DOB: Jan 11, 1976 DOA: 09/13/2022 DOS: the patient was seen and examined on 09/14/2022 PCP: [name], NP  Patient coming from: Home - lives with brother; Jackey Loge: Mother, [phone]   Chief Complaint: Elevated creatinine  HPI: [name] is a 46 y.o. female with medical history significant of HTN, ETOH dependence with cirrhosis, bariatric surgery, bleeding ulcers, and CKD presenting with elevated creatinine per nephrology.  She went to see nephrology on Monday and he did lab work.  He stomach felt like it was swollen.  Her legs are swollen.  He encouraged her to get an appointment with the GI doctor to get her stomach drained, scheduled for tomorrow.  Meanwhile, her creatinine came back at 3.6 and they called her and told her to come to the ER.  Her stomach started swelling last Wednesday.  She has been taking Tylenol for abdominal pain, no help.  Sleeping pills did help some. No prior h/o ascites.  She was told she had cirrhosis in the past and h/o varices.  She has stopped drinking, quit in June.  All issues started 7/16.  She had ulcers and had debridement on her foot and the ulcer on her buttock is healing well per wound care.    ER Course:  Hepatorenal syndrome.  Has alcoholic cirrhosis without h/o ascites.  Recent progressive ascites, tense and uncomfortable.  Saw nephrology Monday, creatinine acutely worsened from 1.4 to 3.6.  K+ ok. INR up, platelets stable.  Hgb stable.  No varices on prior EGD.  GI and nephrology will consult.       Review of Systems: As mentioned in the history of present illness. All other systems reviewed and are negative. Past Medical History:  Diagnosis Date   Acid reflux    Alcohol dependence in remission (HCC)    Anemia    Ulcer    Past Surgical History:  Procedure Laterality Date   ABDOMINAL SURGERY     abdominoplasty   BIOPSY  04/18/2022   Procedure: BIOPSY;  Surgeon:  Lynann Bologna, MD;  Location: [facility] ENDOSCOPY;  Service: Gastroenterology;;   BREAST BIOPSY Right 2013   CHOLECYSTECTOMY N/A 11/23/2019   Procedure: LAPAROSCOPIC CHOLECYSTECTOMY WITH INTRAOPERATIVE CHOLANGIOGRAM;  Surgeon: Rodman Pickle, MD;  Location: [facility] OR;  Service: General;  Laterality: N/A;   COSMETIC SURGERY     tummy tuck   ESOPHAGOGASTRODUODENOSCOPY (EGD) WITH PROPOFOL N/A 04/18/2022   Procedure: ESOPHAGOGASTRODUODENOSCOPY (EGD) WITH PROPOFOL;  Surgeon: Lynann Bologna, MD;  Location[facility] ENDOSCOPY;  Service: Gastroenterology;  Laterality: N/A;   FOOT SURGERY     HERNIA REPAIR     I & D EXTREMITY Left 06/21/2022   Procedure: IRRIGATION AND DEBRIDEMENT EXTREMITY Debridement of Left Foot/APPLICATION OF GRAFT;  Surgeon: Pilar Plate, DPM;  Location: [facility] ORS;  Service: Podiatry;  Laterality: Left;   IR PARACENTESIS  09/14/2022   ROUX-EN-Y PROCEDURE     TUMOR REMOVAL     WRIST GANGLION EXCISION     Social History:  reports that she has been smoking cigarettes. She has a 6.25 pack-year smoking history. She has been exposed to tobacco smoke. She has never used smokeless tobacco. She reports that she does not currently use alcohol. She reports that she does not use drugs.  Allergies  Allergen Reactions   Tape Rash    Clear tape    Family History  Problem Relation Age of Onset   Cancer Mother  kidney   Cancer Paternal Aunt    Breast cancer Maternal Aunt    Pancreatic cancer Maternal Aunt    Lung cancer Paternal Uncle     Prior to Admission medications   Medication Sig Start Date End Date Taking? Authorizing Provider  acetaminophen (TYLENOL) 500 MG tablet Take 500 mg by mouth every 6 (six) hours as needed for mild pain.   Yes [provider]  busPIRone (BUSPAR) 15 MG tablet Take 15 mg by mouth daily as needed (anxiety). 06/09/21  Yes [provider]  Cyanocobalamin (B-12 PO) Take 1 tablet by mouth daily.   Yes [provider]  ferrous  sulfate 325 (65 FE) MG tablet Take 1 tablet (325 mg total) by mouth daily with breakfast. 05/16/22  Yes Grayce SessionsEdwards, Michelle P, NP  folic acid (FOLVITE) 1 MG tablet Take 1 tablet (1 mg total) by mouth daily. 04/22/22  Yes Danford, Earl Liteshristopher P, MD  furosemide (LASIX) 20 MG tablet Take 1 tablet (20 mg total) by mouth daily. 06/23/22 06/23/23 Yes Almon HerculesGonfa, Taye T, MD  lactulose (CHRONULAC) 10 GM/15ML solution Take 30 mLs (20 g total) by mouth 3 (three) times daily as needed for mild constipation or moderate constipation. 06/23/22  Yes Almon HerculesGonfa, Taye T, MD  loperamide (IMODIUM) 2 MG capsule Take 1 capsule (2 mg total) by mouth as needed for diarrhea or loose stools. Patient taking differently: Take 2 mg by mouth daily as needed for diarrhea or loose stools. 04/22/22  Yes Danford, Earl Liteshristopher P, MD  Multiple Vitamin (MULTIVITAMIN) tablet Take 1 tablet by mouth daily.   Yes [provider]  ondansetron (ZOFRAN) 8 MG tablet Take 1 tablet (8 mg total) by mouth every 8 (eight) hours as needed. 07/12/22  Yes Jaci Standardorsey, John T IV, MD  pantoprazole (PROTONIX) 40 MG tablet Take 1 tablet (40 mg total) by mouth 2 (two) times daily before a meal. 04/22/22  Yes Danford, Earl Liteshristopher P, MD  sertraline (ZOLOFT) 50 MG tablet Take 50 mg by mouth daily.   Yes [provider]  spironolactone (ALDACTONE) 50 MG tablet TAKE 1 TABLET BY MOUTH EVERY DAY 08/09/22  Yes Grayce SessionsEdwards, Michelle P, NP  sucralfate (CARAFATE) 1 g tablet Take 1 tablet (1 g total) by mouth 2 (two) times daily. 08/14/22  Yes Lynann BolognaGupta, Rajesh, MD  nicotine polacrilex (NICORETTE) 2 MG gum Take 1 each (2 mg total) by mouth as needed for smoking cessation. Patient not taking: Reported on 09/13/2022 07/03/22   Danelle EarthlySingh, Mayanka, MD    Physical Exam: Vitals:   09/14/22 1500 09/14/22 1710 09/14/22 1715 09/14/22 1845  BP: 109/62  (!) 105/56 111/60  Pulse: 100  86 95  Resp: (!) 21  14 19   Temp:  98.4 F (36.9 C)    TempSrc:  Oral    SpO2: 98%  100% 97%  Weight:       Height:       General:  Appears calm and comfortable and is in NAD, ill Eyes:  EOMI, normal lids, iris ENT:  grossly normal hearing, lips & tongue, mmm Neck:  no LAD, masses or thyromegaly Cardiovascular:  RRR, no m/r/g. 3-4+ LE edema.  Respiratory:   CTA bilaterally with no wheezes/rales/rhonchi.  Normal respiratory effort. Abdomen:  marked ascites, taut Skin:  no rash or induration seen on limited exam Musculoskeletal:  grossly normal tone BUE/BLE, good ROM, no bony abnormality Psychiatric:  blunted mood and affect, speech fluent and appropriate, AOx3 Neurologic:  CN 2-12 grossly intact, moves all extremities in coordinated fashion  Radiological Exams on Admission: Independently reviewed - see discussion in A/P where applicable  IR Paracentesis  Result Date: 09/14/2022 INDICATION: Cirrhosis, ascites EXAM: ULTRASOUND GUIDED RUQ PARACENTESIS MEDICATIONS: None. COMPLICATIONS: None immediate. PROCEDURE: Informed written consent was obtained from the patient after a discussion of the risks, benefits and alternatives to treatment. A timeout was performed prior to the initiation of the procedure. Initial ultrasound scanning demonstrates a moderate amount of ascites within the right lower abdominal quadrant. The right lower abdomen was prepped and draped in the usual sterile fashion. 1% lidocaine was used for local anesthesia. Following this, a 19 gauge, 7-cm, Yueh catheter was introduced. An ultrasound image was saved for documentation purposes. The paracentesis was performed. The catheter was removed and a dressing was applied. The patient tolerated the procedure well without immediate post procedural complication. FINDINGS: A total of approximately 4L of ascitic fluid was removed. Samples were sent to the laboratory as requested by the clinical team. IMPRESSION: Successful ultrasound-guided paracentesis yielding 4 liters of peritoneal fluid. PLAN: If the patient eventually requires >/=2  paracenteses in a 30 day period, candidacy for formal evaluation by the [facility] Interventional Radiology Portal Hypertension Clinic will be assessed. Electronically Signed   By: Gilmer Mor D.O.   On: 09/14/2022 16:48   US Renal  Result Date: 09/13/2022 CLINICAL DATA:  Acute kidney injury EXAM: RENAL / URINARY TRACT ULTRASOUND COMPLETE COMPARISON:  CT 04/10/2022 FINDINGS: Right Kidney: Renal measurements: 11.2 x 4.4 x 4.7 cm = volume: 121.6 mL. Echogenicity within normal limits. No mass or hydronephrosis visualized. Left Kidney: Renal measurements: 10.7 x 4.8 x 3.9 cm = volume: 104.2 mL. Echogenicity within normal limits. No mass or hydronephrosis visualized. Bladder: Appears distended with particulate debris. Other: Large volume of ascites within the abdomen and pelvis. Cirrhotic morphology of the liver. IMPRESSION: 1. Negative for hydronephrosis. 2. Large volume ascites. 3. Cirrhotic morphology of the liver. Electronically Signed   By: Jasmine Pang M.D.   On: 09/13/2022 23:38    EKG: not done   Labs on Admission: I have personally reviewed the available labs and imaging studies at the time of the admission.  Pertinent labs:    Glucose 115 BUN 24/Creatinine 3.64/GFR 15 Albumin 1.9 NH4 50 Bili 2.0 BNP 128.2 WBC 7.8 Hgb 8.5; 11.2 on 11/20 Platelets 102; 149 on 11/20 INR 1.6 UA: small Hgb, rare bacteria   Assessment and Plan: Principal Problem:   Hepatorenal failure (HCC) Active Problems:   Acute kidney injury (HCC)   Thrombocytopenia (HCC)   Tobacco abuse   Ascites due to alcoholic cirrhosis (HCC)    Hepatorenal syndrome -Patient with known alcoholic liver disease with cirrhosis and varices but no prior h/o ascites presenting with marked ascites as well as acute renal failure -MELD/MELD-Na score is 28, with a 71-month mortality rate of 27-32% -Because of this, we tried to have her transferred ER:ER to [facility] (declined because they have hepatology but not liver  transplantation) and [facility] (declined due to lack of insurance, cannot get transplant - insurance resumes January 1, per patient) -She had EGD on 11/20 with grade 0-1 esophageal varices and an oozing GJ anastomotic ulcer that was clipped -Will admit with GI and nephrology consultation. -Based on worsening renal function, will hold diuretics for now and defer fluid status to nephrology; she appears intravascularly depleted but is quite edematous.  -Consider Albumin, will defer to GI. -With ascites, IR consultation requested for paracentesis with plan for fluid analysis at this time. -If improving, she may be able  to dc to home with plan for outpatient transplant evaluation; otherwise, transfer may again need to be considered.  H/o bariatric surgery, recent bleeding ulcer -Recent bleeding ulcer at anastomosis -Continue Protonix  Pressure ulcers -Healing ulcers on left foot and buttocks, per patient -She has been seeing wound care for these and reports ongoing improvement  Mood d/o -Continue sertraline, buspirone  ETOH dependence -None since June, which will help with transplant qualification  Tobacco dependence -Encourage cessation.   -This was discussed with the patient and should be reviewed on an ongoing basis; she is smoking very little but this may preclude her from transplant consideration.   -Patch ordered.    Advance Care Planning:   Code Status: Full Code - Code status was discussed with the patient and/or family at the time of admission.  The patient would want to receive full resuscitative measures at this time.   Consults: Nephrology; GI; IR; nutrition; TOC team  DVT Prophylaxis: SCDs  Family Communication: None present; I spoke with her mother by telephone at the time of admission while in the room with the patient  Severity of Illness: The appropriate patient status for this patient is INPATIENT. Inpatient status is judged to be reasonable and necessary in order to  provide the required intensity of service to ensure the patient's safety. The patient's presenting symptoms, physical exam findings, and initial radiographic and laboratory data in the context of their chronic comorbidities is felt to place them at high risk for further clinical deterioration. Furthermore, it is not anticipated that the patient will be medically stable for discharge from the hospital within 2 midnights of admission.   * I certify that at the point of admission it is my clinical judgment that the patient will require inpatient hospital care spanning beyond 2 midnights from the point of admission due to high intensity of service, high risk for further deterioration and high frequency of surveillance required.*  Author: Jonah Blue, MD 09/14/2022 7:13 PM  For on call review [email or web address].

## 2022-09-14 NOTE — ED Notes (Signed)
Pt in IR, report received from previous RN.

## 2022-09-14 NOTE — ED Notes (Signed)
IV access attempted x2

## 2022-09-14 NOTE — Procedures (Signed)
PROCEDURE SUMMARY:  Successful ultrasound guided paracentesis from the right upper quadrant.  Yielded 4L of ascitic fluid.  No immediate complications.  The patient tolerated the procedure well.   Specimen was sent for labs.  EBL < 36mL  If the patient eventually requires >/=2 paracenteses in a 30 day period, screening evaluation by the [facility] Interventional Radiology Portal Hypertension Clinic will be assessed.   Electronically Signed: Sheliah Plane, PA 01/05/2022, 2:42 PM

## 2022-09-14 NOTE — ED Notes (Signed)
IV team at bedside 

## 2022-09-15 ENCOUNTER — Ambulatory Visit: Payer: Self-pay | Admitting: Gastroenterology

## 2022-09-15 DIAGNOSIS — D696 Thrombocytopenia, unspecified: Secondary | ICD-10-CM

## 2022-09-15 LAB — COMPREHENSIVE METABOLIC PANEL
ALT: 20 U/L (ref 0–44)
AST: 51 U/L — ABNORMAL HIGH (ref 15–41)
Albumin: 2.4 g/dL — ABNORMAL LOW (ref 3.5–5.0)
Alkaline Phosphatase: 141 U/L — ABNORMAL HIGH (ref 38–126)
Anion gap: 9 (ref 5–15)
BUN: 20 mg/dL (ref 6–20)
CO2: 23 mmol/L (ref 22–32)
Calcium: 9.1 mg/dL (ref 8.9–10.3)
Chloride: 105 mmol/L (ref 98–111)
Creatinine, Ser: 3.06 mg/dL — ABNORMAL HIGH (ref 0.44–1.00)
GFR, Estimated: 19 mL/min — ABNORMAL LOW (ref >60)
Glucose, Bld: 116 mg/dL — ABNORMAL HIGH (ref 70–99)
Potassium: 4 mmol/L (ref 3.5–5.1)
Sodium: 137 mmol/L (ref 135–145)
Total Bilirubin: 2.1 mg/dL — ABNORMAL HIGH (ref 0.3–1.2)
Total Protein: 7.2 g/dL (ref 6.5–8.1)

## 2022-09-15 LAB — IRON AND TIBC: Iron: 61 ug/dL (ref 28–170)

## 2022-09-15 LAB — GRAM STAIN: Gram Stain: NONE SEEN

## 2022-09-15 LAB — CBC
HCT: 25.7 % — ABNORMAL LOW (ref 36.0–46.0)
Hemoglobin: 8.4 g/dL — ABNORMAL LOW (ref 12.0–15.0)
MCH: 33.6 pg (ref 26.0–34.0)
MCHC: 32.7 g/dL (ref 30.0–36.0)
MCV: 102.8 fL — ABNORMAL HIGH (ref 80.0–100.0)
Platelets: 104 10*3/uL — ABNORMAL LOW (ref 150–400)
RBC: 2.5 MIL/uL — ABNORMAL LOW (ref 3.87–5.11)
RDW: 14.1 % (ref 11.5–15.5)
WBC: 6.4 10*3/uL (ref 4.0–10.5)
nRBC: 0 % (ref 0.0–0.2)

## 2022-09-15 LAB — FERRITIN: Ferritin: 709 ng/mL — ABNORMAL HIGH (ref 11–307)

## 2022-09-15 LAB — PROTIME-INR
INR: 1.6 — ABNORMAL HIGH (ref 0.8–1.2)
Prothrombin Time: 18.6 seconds — ABNORMAL HIGH (ref 11.4–15.2)

## 2022-09-15 NOTE — Progress Notes (Addendum)
Attending physician's note   I have taken a history, reviewed the chart, and examined the patient. I performed a substantive portion of this encounter, including complete performance of at least one of the key components, in conjunction with the APP. I agree with the APP's note, impression, and recommendations with my edits.   Feeling better today after therapeutic paracentesis yesterday.  Ascites fluid negative for SBP, Gram stain negative.  Renal function improving with creatinine 3.5 --> 3.0.  - Continue IV albumin today - Continue octreotide and midodrine - Continue daily BMP checks - If reaccumulating fluid tomorrow, and if renal function still improving, may need repeat ultrasound with repeat therapeutic paracentesis (again did not exceed 4 L removed) - Holding diuretics - H/H down from baseline, but steady.  No overt bleeding - If prolonged hospital course, reasonable to get MRI liver done while here for evaluation of elevated AFP - Was prescribed lactulose as outpatient for constipation.  Otherwise no history of hepatic encephalopathy.  Normal mentation here - Rechecking iron studies - GI service will continue to follow  [place], DO, FACG [phone] office          Progress Note   Subjective  Chief Complaint: Cirrhosis, AKI, ascites and abdominal distention  Today, the patient tells me that after 4 L of fluid was removed yesterday her abdomen actually felt better but she has had increased nausea requiring antiemetics overnight.  She asked me if she is going to be out of here before Christmas.  Denies any other new complaints or concerns.   Objective   Vital signs in last 24 hours: Temp:  [98.3 F (36.8 C)-98.7 F (37.1 C)] 98.7 F (37.1 C) (12/22 0600) Pulse Rate:  [81-100] 88 (12/22 1045) Resp:  [12-23] 20 (12/22 1045) BP: (98-117)/(52-76) 103/55 (12/22 1045) SpO2:  [91 %-100 %] 91 % (12/22 1045)   General: AA female in NAD Heart:  Regular rate  and rhythm; no murmurs Lungs: Respirations even and unlabored, lungs CTA bilaterally Abdomen:  Soft, nontender and mild -mod distension. Normal bowel sounds. Extremities:  +pitting edema b/l Neurologic:  Alert and oriented,  grossly normal neurologically. Psych:  Cooperative. Normal mood and affect.  Intake/Output from previous day: 12/21 0701 - 12/22 0700 In: 50 [IV Piggyback:50] Out: -   Lab Results: Recent Labs    09/13/22 2223 09/14/22 1539 09/15/22 0642  WBC 7.8 7.3 6.4  HGB 8.5* 7.2* 8.4*  HCT 25.8* 22.0* 25.7*  PLT 102* 112* 104*   BMET Recent Labs    09/13/22 2223 09/14/22 1539 09/15/22 0642  NA 136 133* 137  K 3.8 3.7 4.0  CL 105 106 105  CO2 23 19* 23  GLUCOSE 115* 94 116*  BUN 24* 23* 20  CREATININE 3.64* 3.48* 3.06*  CALCIUM 8.6* 8.6* 9.1      Latest Ref Rng & Units 09/15/2022    6:42 AM 09/14/2022    3:39 PM 09/13/2022   10:23 PM  Hepatic Function  Total Protein 6.5 - 8.1 g/dL 7.2  6.9  7.6   Albumin 3.5 - 5.0 g/dL 2.4  1.9  1.9   AST 15 - 41 U/L 51  63  62   ALT 0 - 44 U/L _0 Alk Phosphatase 38 - 126 U/L 141  151  164   Total Bilirubin 0.3 - 1.2 mg/dL 2.1  1.9  2.0      PT/INR Recent Labs    09/14/22  1539 09/15/22 0642  LABPROT 18.5* 18.6*  INR 1.6* 1.6*    Studies/Results: IR Paracentesis  Result Date: 09/14/2022 INDICATION: Cirrhosis, ascites EXAM: ULTRASOUND GUIDED RUQ PARACENTESIS MEDICATIONS: None. COMPLICATIONS: None immediate. PROCEDURE: Informed written consent was obtained from the patient after a discussion of the risks, benefits and alternatives to treatment. A timeout was performed prior to the initiation of the procedure. Initial ultrasound scanning demonstrates a moderate amount of ascites within the right lower abdominal quadrant. The right lower abdomen was prepped and draped in the usual sterile fashion. 1% lidocaine was used for local anesthesia. Following this, a 19 gauge, 7-cm, Yueh catheter was introduced.  An ultrasound image was saved for documentation purposes. The paracentesis was performed. The catheter was removed and a dressing was applied. The patient tolerated the procedure well without immediate post procedural complication. FINDINGS: A total of approximately 4L of ascitic fluid was removed. Samples were sent to the laboratory as requested by the clinical team. IMPRESSION: Successful ultrasound-guided paracentesis yielding 4 liters of peritoneal fluid. PLAN: If the patient eventually requires >/=2 paracenteses in a 30 day period, candidacy for formal evaluation by the [place] Rad[facility] Portal Hypertension Clinic will be assessed. Electronically Signed   By: Corrie Mckusick D.O.   On: 09/14/2022 16:48   US Renal  Result Date: 09/13/2022 CLINICAL DATA:  Acute kidney injury EXAM: RENAL / URINARY TRACT ULTRASOUND COMPLETE COMPARISON:  CT 04/10/2022 FINDINGS: Right Kidney: Renal measurements: 11.2 x 4.4 x 4.7 cm = volume: 121.6 mL. Echogenicity within normal limits. No mass or hydronephrosis visualized. Left Kidney: Renal measurements: 10.7 x 4.8 x 3.9 cm = volume: 104.2 mL. Echogenicity within normal limits. No mass or hydronephrosis visualized. Bladder: Appears distended with particulate debris. Other: Large volume of ascites within the abdomen and pelvis. Cirrhotic morphology of the liver. IMPRESSION: 1. Negative for hydronephrosis. 2. Large volume ascites. 3. Cirrhotic morphology of the liver. Electronically Signed   By: Donavan Foil M.D.   On: 09/13/2022 23:38     Assessment / Plan:   Assessment: 1.  Decompensated alcoholic cirrhosis with ascites: Lasix and spironolactone on hold due to AKI, paracentesis yesterday with 4 L removed, no sign of SBP, referral to [facility] hepatology clinic but not yet seen, most recent EGD 03/2022 without esophageal varices 2.  Thrombocytopenia 3.  Coagulopathy 4.  AKI on CKD: Baseline creatinine about 1.4, 3.6 on admission--> 3.06  overnight, appreciate nephrology's recommendations 5.  Iron deficiency anemia: On oral iron as outpatient, follows with hematology clinic, baseline hemoglobin around 10-11--> 8.4 today 6.  Marginal ulcers  Plan: 1.  Continue current recommendations and appreciate nephrology's assistance 2.  Did discuss with the patient that likely she will be here for a longer amount of time until we can get her ascites under control and her kidneys are recovered.  I am not sure that this will be before Christmas. 3.  Continue Pantoprazole 40 mg twice a day 4.  Will need MRI of the liver due to elevated AFP as outpatient 5.  Rechecking iron studies today  Thank you for your kind consultation, we will continue to follow.   LOS: 1 day   Levin Erp  09/15/2022, 11:39 AM

## 2022-09-15 NOTE — TOC Initial Note (Signed)
Transition of Care [facility]) - Initial/Assessment Note    Patient Details  Name: [name] MRN: [id number] Date of Birth: 1976/02/08  Transition of Care [facility]) CM/SW Contact:    Durenda Guthrie, RN Phone Number: 09/15/2022, 3:09 PM  Clinical Narrative:                 Transition of Care Screening Note:  Transition of Care [facility]) Department has reviewed patient and no TOC needs have been identified at this time. We will continue to monitor patient advancement through Interdisciplinary progressions and if new patient needs arise, please place a consult.        Patient Goals and CMS Choice            Expected Discharge Plan and Services                                              Prior Living Arrangements/Services                       Activities of Daily Living      Permission Sought/Granted                  Emotional Assessment              Admission diagnosis:  Hepatorenal failure [facility]) [K76.7] Patient Active Problem List   Diagnosis Date Noted   Ascites due to alcoholic cirrhosis (HCC) 09/14/2022   Hepatorenal failure (HCC) 09/14/2022   Ulcer of left foot with muscle involvement without evidence of necrosis (HCC)    Nonhealing ulcer of left lower extremity with necrosis of muscle (HCC)    Alcoholic peripheral neuropathy (HCC)    Open wound of left foot 06/20/2022   Acute hyponatremia 06/20/2022   GERD (gastroesophageal reflux disease) 06/20/2022   Essential hypertension 06/20/2022   Tobacco abuse 06/20/2022   Cellulitis 05/03/2022   Rash 04/30/2022   Anemia 04/25/2022   Lactic acidosis 04/19/2022   Hypokalemia 04/19/2022   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HCC) 04/19/2022   Acute respiratory failure with hypoxia (HCC) 04/19/2022   Aspiration pneumonia (HCC) 04/19/2022   Anasarca 04/19/2022   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HCC) 04/19/2022   Marginal ulcer 04/19/2022   Obesity (BMI 30-39.9) 04/19/2022   Acute on  chronic anemia 04/17/2022   Hypovolemic shock (HCC) 04/17/2022   Thrombocytopenia (HCC) 04/17/2022   Alcoholic hepatitis 04/17/2022   Gastroenteritis due to norovirus 04/17/2022   Acute kidney injury (HCC) 04/10/2022   Chronic iron deficiency anemia 07/07/2021   Acute cholecystitis due to biliary calculus 11/22/2019   Enlarged lymph nodes in armpit 08/01/2012   PCP:  [name], NP Pharmacy:   [facility] [phone] - [place], [place] - [address] AT [facility] [address] [place] [address] Phone: [phone] Fax: [phone]  [facility] [address] [place] [address] Phone: [phone] Fax: [phone]  [place] [facility] [address] [place] [address] Phone: [phone] Fax: [phone]  [facility] [address] [place] [address] Phone: [phone] Fax: [phone]     Social Determinants of Health (SDOH) Social History: SDOH Screenings   Food Insecurity: No Food Insecurity (06/20/2022)  Housing: Medium Risk (06/20/2022)  Transportation Needs: No Transportation Needs (06/20/2022)  Utilities: At Risk (06/20/2022)  Depression (PHQ2-9): Low Risk  (07/19/2022)  Recent Concern: Depression (PHQ2-9) - Medium Risk (06/07/2022)  Tobacco Use: High Risk (09/14/2022)   SDOH Interventions:     Readmission Risk Interventions    06/22/2022    3:14 PM 05/02/2022   11:34 AM  Readmission Risk Prevention Plan  Transportation Screening Complete Complete  Medication Review [facility]) Complete Complete  PCP or Specialist appointment within 3-5 days of discharge Complete Complete  HRI or Home Care Consult Complete Complete  SW Recovery Care/Counseling Consult Complete Complete  Palliative Care Screening Not Applicable Not Applicable  Skilled Nursing Facility Not Applicable Not Applicable

## 2022-09-15 NOTE — Progress Notes (Signed)
[facility] Progress Note   Assessment/ Plan:    AKI on CKD: BP low and new ascites and decomp cirrhosis - UA bland - needs HRS rx- midodrine/ octreotide/ albumin--> on it, Cr improving - try this before terlipressin - GI seeing, appreciate - no more than 4L LVP if possible - holding lasix/ aldactone   2.  Decomp cirrhosis             - ammonia 50- CTM             - HRS as above             - [facility]   3.  Anemia:             -chekcing iron             - IV if needed   4.  GI ulcers             - PPI   5.  Dispo: admitted  Subjective:    Cr is coming down.     Objective:   BP 102/64   Pulse 85   Temp 97.7 F (36.5 C) (Oral)   Resp 14   Ht 5\' 6"  (1.676 m)   Wt 83.5 kg   LMP 03/10/2022 (Approximate)   SpO2 96%   BMI 29.70 kg/m   Physical Exam: pt not in room on multiple attempts- see exam from 12/21 GEN NAD, tearful HEENT EMOI PERRL NECK no overt JVD PULM clear CV RRR ABD distende dwith + fluid wave EXT 2+ LE edema NEURO AAO x 3, no asterixis MSK gauze on L ankle  Labs: BMET Recent Labs  Lab 09/13/22 2223 09/14/22 1539 09/15/22 0642  NA 136 133* 137  K 3.8 3.7 4.0  CL 105 106 105  CO2 23 19* 23  GLUCOSE 115* 94 116*  BUN 24* 23* 20  CREATININE 3.64* 3.48* 3.06*  CALCIUM 8.6* 8.6* 9.1   CBC Recent Labs  Lab 09/13/22 2223 09/14/22 1539 09/15/22 0642  WBC 7.8 7.3 6.4  NEUTROABS 4.3  --   --   HGB 8.5* 7.2* 8.4*  HCT 25.8* 22.0* 25.7*  MCV 102.0* 102.8* 102.8*  PLT 102* 112* 104*      Medications:     midodrine  10 mg Oral TID WC   nicotine  7 mg Transdermal Daily   octreotide  100 mcg Subcutaneous TID   pantoprazole  40 mg Oral BID AC   sertraline  50 mg Oral Daily   sodium chloride flush  3 mL Intravenous Q12H   spironolactone  50 mg Oral Daily   sucralfate  1 g Oral BID     09/17/22, MD 09/15/2022, 3:01 PM

## 2022-09-15 NOTE — Progress Notes (Signed)
PROGRESS NOTE    [name]  [id number] DOB: [birth year] DOA: 09/13/2022 PCP: Kerin Perna, NP  Outpatient Specialists:     Brief Narrative:  As per H&P done on admission: "[name] is a 46 y.o. female with medical history significant of HTN, ETOH dependence with cirrhosis, bariatric surgery, bleeding ulcers, and CKD presenting with elevated creatinine per nephrology.  She went to see nephrology on Monday and he did lab work.  He stomach felt like it was swollen.  Her legs are swollen.  He encouraged her to get an appointment with the GI doctor to get her stomach drained, scheduled for tomorrow.  Meanwhile, her creatinine came back at 3.6 and they called her and told her to come to the ER.  Her stomach started swelling last Wednesday.  She has been taking Tylenol for abdominal pain, no help.  Sleeping pills did help some. No prior h/o ascites.  She was told she had cirrhosis in the past and h/o varices.  She has stopped drinking, quit in June.  All issues started 7/16.  She had ulcers and had debridement on her foot and the ulcer on her buttock is healing well per wound care.       ER Course:  Hepatorenal syndrome.  Has alcoholic cirrhosis without h/o ascites.  Recent progressive ascites, tense and uncomfortable.  Saw nephrology Monday, creatinine acutely worsened from 1.4 to 3.6.  K+ ok. INR up, platelets stable.  Hgb stable.  No varices on prior EGD.  GI and nephrology will consult".    09/15/2022: Patient is a 46 year old female with alcoholic liver cirrhosis, admitted with new onset ascites, volume overload and hepatorenal syndrome.  Nephrology and GI is appreciated.  Slight improvement is noted in patient's renal function.  Patient has undergone paracentesis on 4 L of ascitic fluid taken out.  Serum creatinine has improved from 3.64-3.06.  Albumin is 2.4 today.  Patient is currently on midodrine and octreotide.   Assessment & Plan:   Principal Problem:    Hepatorenal failure ([place]) Active Problems:   Acute kidney injury ([place])   Thrombocytopenia (HCC)   Tobacco abuse   Ascites due to alcoholic cirrhosis ([place])   Hepatorenal syndrome -Patient with known alcoholic liver disease with cirrhosis and varices but no prior h/o ascites presenting with marked ascites as well as acute renal failure -Diuretics were held on presentation. -Patient is currently on octreotide and midodrine. -Albumin with paracentesis.  Patient has undergone Foley catheter ultrasound-guided paracentesis. -Slight improvement in renal function. -Input from GI and nephrology team is highly appreciated. -Transplant team has been contacted, awaiting acceptance. -No recent alcohol use.   H/o bariatric surgery, recent bleeding ulcer -Recent bleeding ulcer at anastomosis -Continue Protonix   Pressure ulcers -Healing ulcers on left foot and buttocks, per patient   Mood d/o -Continue sertraline, buspirone   ETOH dependence -None since June   Tobacco dependence -Encourage cessation.     DVT prophylaxis: SCD Code Status: Full code Family Communication:  Disposition Plan: This will depend on hospital course.  Referral has been made to a transplant center.   Consultants:  Nephrology GI.  Procedures:  Ultrasound-guided paracentesis.  4 L of ascitic fluid was taken out on 09/14/2022.  Antimicrobials:  None.   Subjective: -No new complaints. -Abdominal distention persists, but significantly improved after paracentesis. -No fever or chills. -Patient continues to make urine.  Objective: Vitals:   09/15/22 1245 09/15/22 1300 09/15/22 1400 09/15/22 1515  BP: 108/69 102/64  113/64  Pulse: 88 85  81  Resp: 20 14  17   Temp:   97.7 F (36.5 C)   TempSrc:   Oral   SpO2: 99% 96%  95%  Weight:      Height:        Intake/Output Summary (Last 24 hours) at 09/15/2022 1522 Last data filed at 09/14/2022 2103 Gross per 24 hour  Intake 50 ml  Output --  Net 50 ml    Filed Weights   09/13/22 2212  Weight: 83.5 kg    Examination:  General exam: Appears calm and comfortable.  Patient is pale and jaundiced. Respiratory system: Clear to auscultation.  Cardiovascular system: S1 & S2 with systolic murmur.   Gastrointestinal system: Dullness of the flanks.   Central nervous system: Alert and oriented. No focal neurological deficits. Extremities: Significant bilateral lower extremity edema.  Data Reviewed: I have personally reviewed following labs and imaging studies  CBC: Recent Labs  Lab 09/13/22 2223 09/14/22 1539 09/15/22 0642  WBC 7.8 7.3 6.4  NEUTROABS 4.3  --   --   HGB 8.5* 7.2* 8.4*  HCT 25.8* 22.0* 25.7*  MCV 102.0* 102.8* 102.8*  PLT 102* 112* 104*   Basic Metabolic Panel: Recent Labs  Lab 09/13/22 2223 09/14/22 1539 09/15/22 0642  NA 136 133* 137  K 3.8 3.7 4.0  CL 105 106 105  CO2 23 19* 23  GLUCOSE 115* 94 116*  BUN 24* 23* 20  CREATININE 3.64* 3.48* 3.06*  CALCIUM 8.6* 8.6* 9.1   GFR: Estimated Creatinine Clearance: 25.3 mL/min (A) (by C-G formula based on SCr of 3.06 mg/dL (H)). Liver Function Tests: Recent Labs  Lab 09/13/22 2223 09/14/22 1539 09/15/22 0642  AST 62* 63* 51*  ALT 24 24 20   ALKPHOS 164* 151* 141*  BILITOT 2.0* 1.9* 2.1*  PROT 7.6 6.9 7.2  ALBUMIN 1.9* 1.9* 2.4*   Recent Labs  Lab 09/13/22 2223  LIPASE 37   Recent Labs  Lab 09/13/22 2223  AMMONIA 50*   Coagulation Profile: Recent Labs  Lab 09/13/22 2223 09/14/22 1539 09/15/22 0642  INR 1.6* 1.6* 1.6*   Cardiac Enzymes: No results for input(s): "CKTOTAL", "CKMB", "CKMBINDEX", "TROPONINI" in the last 168 hours. BNP (last 3 results) No results for input(s): "PROBNP" in the last 8760 hours. HbA1C: No results for input(s): "HGBA1C" in the last 72 hours. CBG: No results for input(s): "GLUCAP" in the last 168 hours. Lipid Profile: No results for input(s): "CHOL", "HDL", "LDLCALC", "TRIG", "CHOLHDL", "LDLDIRECT" in the last  72 hours. Thyroid Function Tests: No results for input(s): "TSH", "T4TOTAL", "FREET4", "T3FREE", "THYROIDAB" in the last 72 hours. Anemia Panel: No results for input(s): "VITAMINB12", "FOLATE", "FERRITIN", "TIBC", "IRON", "RETICCTPCT" in the last 72 hours. Urine analysis:    Component Value Date/Time   COLORURINE YELLOW 09/13/2022 2201   APPEARANCEUR HAZY (A) 09/13/2022 2201   LABSPEC 1.015 09/13/2022 2201   PHURINE 5.0 09/13/2022 2201   GLUCOSEU NEGATIVE 09/13/2022 2201   HGBUR SMALL (A) 09/13/2022 2201   BILIRUBINUR NEGATIVE 09/13/2022 2201   KETONESUR NEGATIVE 09/13/2022 2201   PROTEINUR NEGATIVE 09/13/2022 2201   UROBILINOGEN 0.2 01/09/2013 2111   NITRITE NEGATIVE 09/13/2022 2201   LEUKOCYTESUR NEGATIVE 09/13/2022 2201   Sepsis Labs: @LABRCNTIP (procalcitonin:4,lacticidven:4)  ) Recent Results (from the past 240 hour(s))  Culture, blood (routine x 2)     Status: None (Preliminary result)   Collection Time: 09/14/22  4:26 PM   Specimen: BLOOD RIGHT ARM  Result Value Ref Range Status   Specimen  Description BLOOD RIGHT ARM  Final   Special Requests   Final    BOTTLES DRAWN AEROBIC AND ANAEROBIC Blood Culture results may not be optimal due to an inadequate volume of blood received in culture bottles   Culture   Final    NO GROWTH < 24 HOURS Performed at [facility] [address] [place], [place] [address]    Report Status PENDING  Incomplete  Culture, blood (routine x 2)     Status: None (Preliminary result)   Collection Time: 09/14/22  4:27 PM   Specimen: BLOOD LEFT ARM  Result Value Ref Range Status   Specimen Description BLOOD LEFT ARM  Final   Special Requests   Final    BOTTLES DRAWN AEROBIC AND ANAEROBIC Blood Culture results may not be optimal due to an inadequate volume of blood received in culture bottles   Culture   Final    NO GROWTH < 24 HOURS Performed at [facility], [place] [address] [place], [place] [address]    Report Status PENDING   Incomplete  Culture, body fluid w Gram Stain-bottle     Status: None (Preliminary result)   Collection Time: 09/14/22  4:48 PM   Specimen: Peritoneal Washings  Result Value Ref Range Status   Specimen Description PERITONEAL  Final   Special Requests NONE  Final   Culture   Final    NO GROWTH < 24 HOURS Performed at [facility], [place] [address] [place], [place] [address]    Report Status PENDING  Incomplete  Gram stain     Status: None   Collection Time: 09/14/22  4:48 PM   Specimen: Peritoneal Washings  Result Value Ref Range Status   Specimen Description PERITONEAL  Final   Special Requests NONE  Final   Gram Stain   Final    NO WBC SEEN NO ORGANISMS SEEN Performed at [facility], [address], [place], [place] [address]    Report Status 09/15/2022 FINAL  Final         Radiology Studies: IR Paracentesis  Result Date: 09/14/2022 INDICATION: Cirrhosis, ascites EXAM: ULTRASOUND GUIDED RUQ PARACENTESIS MEDICATIONS: None. COMPLICATIONS: None immediate. PROCEDURE: Informed written consent was obtained from the patient after a discussion of the risks, benefits and alternatives to treatment. A timeout was performed prior to the initiation of the procedure. Initial ultrasound scanning demonstrates a moderate amount of ascites within the right lower abdominal quadrant. The right lower abdomen was prepped and draped in the usual sterile fashion. 1% lidocaine was used for local anesthesia. Following this, a 19 gauge, 7-cm, Yueh catheter was introduced. An ultrasound image was saved for documentation purposes. The paracentesis was performed. The catheter was removed and a dressing was applied. The patient tolerated the procedure well without immediate post procedural complication. FINDINGS: A total of approximately 4L of ascitic fluid was removed. Samples were sent to the laboratory as requested by the clinical team. IMPRESSION: Successful ultrasound-guided paracentesis yielding  4 liters of peritoneal fluid. PLAN: If the patient eventually requires >/=2 paracenteses in a 30 day period, candidacy for formal evaluation by the [facility] will be assessed. Electronically Signed   By: Corrie Mckusick D.O.   On: 09/14/2022 16:48   US Renal  Result Date: 09/13/2022 CLINICAL DATA:  Acute kidney injury EXAM: RENAL / URINARY TRACT ULTRASOUND COMPLETE COMPARISON:  CT 04/10/2022 FINDINGS: Right Kidney: Renal measurements: 11.2 x 4.4 x 4.7 cm = volume:  121.6 mL. Echogenicity within normal limits. No mass or hydronephrosis visualized. Left Kidney: Renal measurements: 10.7 x 4.8 x 3.9 cm = volume: 104.2 mL. Echogenicity within normal limits. No mass or hydronephrosis visualized. Bladder: Appears distended with particulate debris. Other: Large volume of ascites within the abdomen and pelvis. Cirrhotic morphology of the liver. IMPRESSION: 1. Negative for hydronephrosis. 2. Large volume ascites. 3. Cirrhotic morphology of the liver. Electronically Signed   By: Donavan Foil M.D.   On: 09/13/2022 23:38        Scheduled Meds:  midodrine  10 mg Oral TID WC   nicotine  7 mg Transdermal Daily   octreotide  100 mcg Subcutaneous TID   pantoprazole  40 mg Oral BID AC   sertraline  50 mg Oral Daily   sodium chloride flush  3 mL Intravenous Q12H   spironolactone  50 mg Oral Daily   sucralfate  1 g Oral BID   Continuous Infusions:   LOS: 1 day    Time spent: 55 minutes.    Dana Allan, MD  [facility] Pager #: [phone] 7PM-7AM contact night coverage as above

## 2022-09-16 LAB — COMPREHENSIVE METABOLIC PANEL
ALT: 17 U/L (ref 0–44)
AST: 45 U/L — ABNORMAL HIGH (ref 15–41)
Albumin: 2.1 g/dL — ABNORMAL LOW (ref 3.5–5.0)
Alkaline Phosphatase: 108 U/L (ref 38–126)
Anion gap: 7 (ref 5–15)
BUN: 17 mg/dL (ref 6–20)
CO2: 24 mmol/L (ref 22–32)
Calcium: 8.6 mg/dL — ABNORMAL LOW (ref 8.9–10.3)
Chloride: 108 mmol/L (ref 98–111)
Creatinine, Ser: 2.66 mg/dL — ABNORMAL HIGH (ref 0.44–1.00)
GFR, Estimated: 22 mL/min — ABNORMAL LOW (ref >60)
Glucose, Bld: 112 mg/dL — ABNORMAL HIGH (ref 70–99)
Potassium: 4.1 mmol/L (ref 3.5–5.1)
Sodium: 139 mmol/L (ref 135–145)
Total Bilirubin: 2.3 mg/dL — ABNORMAL HIGH (ref 0.3–1.2)
Total Protein: 6 g/dL — ABNORMAL LOW (ref 6.5–8.1)

## 2022-09-16 LAB — CBC WITH DIFFERENTIAL/PLATELET
Abs Immature Granulocytes: 0.01 10*3/uL (ref 0.00–0.07)
Basophils Absolute: 0.1 10*3/uL (ref 0.0–0.1)
Basophils Relative: 1 %
Eosinophils Absolute: 0.1 10*3/uL (ref 0.0–0.5)
Eosinophils Relative: 1 %
HCT: 22.9 % — ABNORMAL LOW (ref 36.0–46.0)
Hemoglobin: 7.5 g/dL — ABNORMAL LOW (ref 12.0–15.0)
Immature Granulocytes: 0 %
Lymphocytes Relative: 42 %
Lymphs Abs: 2.1 10*3/uL (ref 0.7–4.0)
MCH: 33.9 pg (ref 26.0–34.0)
MCHC: 32.8 g/dL (ref 30.0–36.0)
MCV: 103.6 fL — ABNORMAL HIGH (ref 80.0–100.0)
Monocytes Absolute: 0.5 10*3/uL (ref 0.1–1.0)
Monocytes Relative: 9 %
Neutro Abs: 2.3 10*3/uL (ref 1.7–7.7)
Neutrophils Relative %: 47 %
Platelets: 85 10*3/uL — ABNORMAL LOW (ref 150–400)
RBC: 2.21 MIL/uL — ABNORMAL LOW (ref 3.87–5.11)
RDW: 14.6 % (ref 11.5–15.5)
WBC: 5 10*3/uL (ref 4.0–10.5)
nRBC: 0 % (ref 0.0–0.2)

## 2022-09-16 LAB — MAGNESIUM: Magnesium: 2.2 mg/dL (ref 1.7–2.4)

## 2022-09-16 MED ORDER — NEPRO/CARBSTEADY PO LIQD
237.0000 mL | Freq: Two times a day (BID) | ORAL | Status: DC
  Administered 2022-09-17 – 2022-09-21 (×8): 237 mL via ORAL

## 2022-09-16 MED ORDER — RENA-VITE PO TABS
1.0000 | ORAL_TABLET | Freq: Every day | ORAL | Status: DC
  Administered 2022-09-16 – 2022-09-20 (×5): 1 via ORAL
  Filled 2022-09-16 (×5): qty 1

## 2022-09-16 NOTE — Progress Notes (Signed)
MD informed that patient has 6 non-sustained vTach. Pt is asymptomatic, vitals were BP: 107/59, HR: 82, RR: 16. Will continue to monitor.

## 2022-09-16 NOTE — Progress Notes (Signed)
Initial Nutrition Assessment  DOCUMENTATION CODES:   Not applicable  INTERVENTION:  - Add Nepro Shake po BID, each supplement provides 425 kcal and 19 grams protein  - Add Renal MVI.   NUTRITION DIAGNOSIS:   Increased nutrient needs related to chronic illness as evidenced by estimated needs.  GOAL:   Patient will meet greater than or equal to 90% of their needs  MONITOR:   PO intake, Supplement acceptance  REASON FOR ASSESSMENT:   Consult Assessment of nutrition requirement/status  ASSESSMENT:   46 y.o. female admits related to elevated creatinine. PMH includes: HTN, ETOH dependence with cirrhosis, bariatric surgery, bleeding ulcers and CKD. Pt is currently receiving medical management for hepatorenal syndrome.  Meds reviewed: carafate. Labs reviewed.   RD attempted to call pt's room but no answer. No intakes documented yet. RD will add Nepro BID for now. Will continue to monitor PO intakes. Wts are stable per record.   NUTRITION - FOCUSED PHYSICAL EXAM:  Unable to assess, attempt at f/u.  Diet Order:   Diet Order             Diet renal with fluid restriction Room service appropriate? Yes; Fluid consistency: Thin  Diet effective now                   EDUCATION NEEDS:   Not appropriate for education at this time  Skin:  Skin Assessment: Reviewed RN Assessment  Last BM:  09/16/22  Height:   Ht Readings from Last 1 Encounters:  09/13/22 5\' 6"  (1.676 m)    Weight:   Wt Readings from Last 1 Encounters:  09/13/22 83.5 kg    Ideal Body Weight:     BMI:  Body mass index is 29.7 kg/m.  Estimated Nutritional Needs:   Kcal:  09/15/22 kcals  Protein:  105-125 gm  Fluid:  >/= 2 L  [id number], RD, LDN, CNSC.

## 2022-09-16 NOTE — Plan of Care (Signed)

## 2022-09-16 NOTE — Progress Notes (Addendum)
Attending physician's note   I have taken a history, reviewed the chart, and examined the patient. I performed a substantive portion of this encounter, including complete performance of at least one of the key components, in conjunction with the APP. I agree with the APP's note, impression, and recommendations with my edits.   Renal function continues to improve with creatinine 2.66 today.  Has had some reaccumulation of abdominal ascites.  - Repeat ultrasound and paracentesis today.  Limit to 4 L removed - Holding Lasix/Aldactone.  I discussed with the Nephrologist today.  Hopefully her renal function returns to baseline and may be able to restart low-dose spironolactone to start and assess renal tolerance.  Will continue to assess daily - Continue albumin - Continue midodrine and octreotide - GI service will continue to follow  Ewelina Naves, DO, FACG [phone] office          Progress Note   Subjective  Chief Complaint: Cirrhosis, AKI, ascites and abdominal distention  Today, patient tells me that the fluid seems to have built back up and her abdomen is distended and uncomfortable.  Apparently she has seen nephrology who recommended a paracentesis but wanted to talk to [place] first.  She is asking long-term how long she will need to be in here and what the plan is.  Denies any other new complaints or concerns.  Ongoing nausea and vomiting.   Objective   Vital signs in last 24 hours: Temp:  [97.7 F (36.5 C)-98.7 F (37.1 C)] 98.4 F (36.9 C) (12/23 1206) Pulse Rate:  [74-93] 93 (12/23 1206) Resp:  [12-18] 16 (12/23 1206) BP: (106-142)/(58-83) 116/73 (12/23 1206) SpO2:  [95 %-99 %] 98 % (12/23 1206)   General:    AA female in NAD Heart:  Regular rate and rhythm; no murmurs +pitting edema b/l Lungs: Respirations even and unlabored, lungs CTA bilaterally Abdomen:  tense, nontender and mild-moderate distension. Normal bowel sounds. Psych:  Cooperative. Normal mood  and affect.  Lab Results: Recent Labs    09/14/22 1539 09/15/22 0642 09/16/22 0317  WBC 7.3 6.4 5.0  HGB 7.2* 8.4* 7.5*  HCT 22.0* 25.7* 22.9*  PLT 112* 104* 85*   BMET Recent Labs    09/14/22 1539 09/15/22 0642 09/16/22 0317  NA 133* 137 139  K 3.7 4.0 4.1  CL 106 105 108  CO2 19* 23 24  GLUCOSE 94 116* 112*  BUN 23* 20 17  CREATININE 3.48* 3.06* 2.66*  CALCIUM 8.6* 9.1 8.6*   LFT Recent Labs    09/16/22 0317  PROT 6.0*  ALBUMIN 2.1*  AST 45*  ALT 17  ALKPHOS 108  BILITOT 2.3*   PT/INR Recent Labs    09/14/22 1539 09/15/22 0642  LABPROT 18.5* 18.6*  INR 1.6* 1.6*    Studies/Results: IR Paracentesis  Result Date: 09/14/2022 INDICATION: Cirrhosis, ascites EXAM: ULTRASOUND GUIDED RUQ PARACENTESIS MEDICATIONS: None. COMPLICATIONS: None immediate. PROCEDURE: Informed written consent was obtained from the patient after a discussion of the risks, benefits and alternatives to treatment. A timeout was performed prior to the initiation of the procedure. Initial ultrasound scanning demonstrates a moderate amount of ascites within the right lower abdominal quadrant. The right lower abdomen was prepped and draped in the usual sterile fashion. 1% lidocaine was used for local anesthesia. Following this, a 19 gauge, 7-cm, Yueh catheter was introduced. An ultrasound image was saved for documentation purposes. The paracentesis was performed. The catheter was removed and a dressing was applied. The patient  tolerated the procedure well without immediate post procedural complication. FINDINGS: A total of approximately 4L of ascitic fluid was removed. Samples were sent to the laboratory as requested by the clinical team. IMPRESSION: Successful ultrasound-guided paracentesis yielding 4 liters of peritoneal fluid. PLAN: If the patient eventually requires >/=2 paracenteses in a 30 day period, candidacy for formal evaluation by the [place] [facility] will be assessed. Electronically Signed   By: Corrie Mckusick D.O.   On: 09/14/2022 16:48      Assessment / Plan:    Assessment: 1.  Decompensated alcoholic cirrhosis with ascites: Lasix and spironolactone on hold due to AKI, paracentesis on 12/21 with 4 L removed, no sign of SBP, referral to [facility] hepatology clinic but not yet seen, most recent EGD 03/2022 without esophageal varices 2.  Thrombocytopenia 3.  Coagulopathy 4.  AKI on CKD: Creatinine slowly improving 5.  Iron deficiency anemia: Iron 61 (other iron studies unable to be calculated), ferritin 709 6.  History of marginal ulcer  Plan: 1.  Agree with repeat for 4L paracentesis today 2.  Appreciate nephrology's ongoing guidance 3.  At some point will need MRI of the liver due to elevated AFP as an outpatient 4.  Continue Pantoprazole 40 mg twice daily 5.  Continue Octreotide and Midodrine  Thank you for your kind consultation, we will continue to follow.   LOS: 2 days   Levin Erp  09/16/2022, 1:26 PM

## 2022-09-16 NOTE — Progress Notes (Signed)
PROGRESS NOTE    [name]  ZOX:[id number] DOB: 1975-11-03 DOA: 09/13/2022 PCP: Grayce SessionsEdwards, Michelle P, NP  Outpatient Specialists:     Brief Narrative:  As per H&P done on admission: "[name] is a 46 y.o. female with medical history significant of HTN, ETOH dependence with cirrhosis, bariatric surgery, bleeding ulcers, and CKD presenting with elevated creatinine per nephrology.  She went to see nephrology on Monday and he did lab work.  He stomach felt like it was swollen.  Her legs are swollen.  He encouraged her to get an appointment with the GI doctor to get her stomach drained, scheduled for tomorrow.  Meanwhile, her creatinine came back at 3.6 and they called her and told her to come to the ER.  Her stomach started swelling last Wednesday.  She has been taking Tylenol for abdominal pain, no help.  Sleeping pills did help some. No prior h/o ascites.  She was told she had cirrhosis in the past and h/o varices.  She has stopped drinking, quit in June.  All issues started 7/16.  She had ulcers and had debridement on her foot and the ulcer on her buttock is healing well per wound care.       ER Course:  Hepatorenal syndrome.  Has alcoholic cirrhosis without h/o ascites.  Recent progressive ascites, tense and uncomfortable.  Saw nephrology Monday, creatinine acutely worsened from 1.4 to 3.6.  K+ ok. INR up, platelets stable.  Hgb stable.  No varices on prior EGD.  GI and nephrology will consult".    09/15/2022: Patient is a 46 year old female with alcoholic liver cirrhosis, admitted with new onset ascites, volume overload and hepatorenal syndrome.  Nephrology and GI is appreciated.  Slight improvement is noted in patient's renal function.  Patient has undergone paracentesis on 4 L of ascitic fluid taken out.  Serum creatinine has improved from 3.64-3.06.  Albumin is 2.4 today.  Patient is currently on midodrine and octreotide.  09/16/2022: No new changes.  Renal function continues to  improve.  Serum creatinine is down to 2.66.  Albumin is 2.1 today.   Assessment & Plan:   Principal Problem:   Hepatorenal failure (HCC) Active Problems:   Acute kidney injury (HCC)   Thrombocytopenia (HCC)   Tobacco abuse   Ascites due to alcoholic cirrhosis (HCC)   Hepatorenal syndrome -Patient with known alcoholic liver disease with cirrhosis and varices but no prior h/o ascites presenting with marked ascites as well as acute renal failure -Diuretics were held on presentation. -Patient is currently on octreotide and midodrine. -Albumin with paracentesis.  Patient has undergone Foley catheter ultrasound-guided paracentesis. -Slight improvement in renal function. -Input from GI and nephrology team is highly appreciated. -Transplant team has been contacted, awaiting acceptance. -No recent alcohol use. 09/16/2022: Renal function is gradually improving.  Will continue current management.   H/o bariatric surgery, recent bleeding ulcer -Recent bleeding ulcer at anastomosis -Continue Protonix   Pressure ulcers -Healing ulcers on left foot and buttocks, per patient   Mood d/o -Continue sertraline, buspirone   ETOH dependence -None since June   Tobacco dependence -Encourage cessation.     DVT prophylaxis: SCD Code Status: Full code Family Communication:  Disposition Plan: This will depend on hospital course.  Referral has been made to a transplant center.   Consultants:  Nephrology GI.  Procedures:  Ultrasound-guided paracentesis.  4 L of ascitic fluid was taken out on 09/14/2022.  Antimicrobials:  None.   Subjective: -No new complaints. -No fever or  chills. -Patient continues to make urine.  Objective: Vitals:   09/15/22 1940 09/16/22 0205 09/16/22 0400 09/16/22 0848  BP: (!) 106/58 (!) 107/59 111/66 (!) 142/83  Pulse: 75 79 81 89  Resp: 16 16 12    Temp: 98.2 F (36.8 C)  97.9 F (36.6 C) 97.8 F (36.6 C)  TempSrc: Oral  Oral Oral  SpO2:    99%   Weight:      Height:       No intake or output data in the 24 hours ending 09/16/22 1118  Filed Weights   09/13/22 2212  Weight: 83.5 kg    Examination:  General exam: Appears calm and comfortable.  Patient is pale and jaundiced. Respiratory system: Clear to auscultation.  Cardiovascular system: S1 & S2 with systolic murmur.   Gastrointestinal system: Dullness of the flanks.   Central nervous system: Alert and oriented. No focal neurological deficits. Extremities: Significant bilateral lower extremity edema.  Data Reviewed: I have personally reviewed following labs and imaging studies  CBC: Recent Labs  Lab 09/13/22 2223 09/14/22 1539 09/15/22 0642 09/16/22 0317  WBC 7.8 7.3 6.4 5.0  NEUTROABS 4.3  --   --  2.3  HGB 8.5* 7.2* 8.4* 7.5*  HCT 25.8* 22.0* 25.7* 22.9*  MCV 102.0* 102.8* 102.8* 103.6*  PLT 102* 112* 104* 85*    Basic Metabolic Panel: Recent Labs  Lab 09/13/22 2223 09/14/22 1539 09/15/22 0642 09/16/22 0317  NA 136 133* 137 139  K 3.8 3.7 4.0 4.1  CL 105 106 105 108  CO2 23 19* 23 24  GLUCOSE 115* 94 116* 112*  BUN 24* 23* 20 17  CREATININE 3.64* 3.48* 3.06* 2.66*  CALCIUM 8.6* 8.6* 9.1 8.6*  MG  --   --   --  2.2    GFR: Estimated Creatinine Clearance: 29.1 mL/min (A) (by C-G formula based on SCr of 2.66 mg/dL (H)). Liver Function Tests: Recent Labs  Lab 09/13/22 2223 09/14/22 1539 09/15/22 0642 09/16/22 0317  AST 62* 63* 51* 45*  ALT 24 24 20 17   ALKPHOS 164* 151* 141* 108  BILITOT 2.0* 1.9* 2.1* 2.3*  PROT 7.6 6.9 7.2 6.0*  ALBUMIN 1.9* 1.9* 2.4* 2.1*    Recent Labs  Lab 09/13/22 2223  LIPASE 37    Recent Labs  Lab 09/13/22 2223  AMMONIA 50*    Coagulation Profile: Recent Labs  Lab 09/13/22 2223 09/14/22 1539 09/15/22 0642  INR 1.6* 1.6* 1.6*    Cardiac Enzymes: No results for input(s): "CKTOTAL", "CKMB", "CKMBINDEX", "TROPONINI" in the last 168 hours. BNP (last 3 results) No results for input(s): "PROBNP"  in the last 8760 hours. HbA1C: No results for input(s): "HGBA1C" in the last 72 hours. CBG: No results for input(s): "GLUCAP" in the last 168 hours. Lipid Profile: No results for input(s): "CHOL", "HDL", "LDLCALC", "TRIG", "CHOLHDL", "LDLDIRECT" in the last 72 hours. Thyroid Function Tests: No results for input(s): "TSH", "T4TOTAL", "FREET4", "T3FREE", "THYROIDAB" in the last 72 hours. Anemia Panel: Recent Labs    09/15/22 1335  FERRITIN 709*  TIBC NOT CALCULATED  IRON 61   Urine analysis:    Component Value Date/Time   COLORURINE YELLOW 09/13/2022 2201   APPEARANCEUR HAZY (A) 09/13/2022 2201   LABSPEC 1.015 09/13/2022 2201   PHURINE 5.0 09/13/2022 2201   GLUCOSEU NEGATIVE 09/13/2022 2201   HGBUR SMALL (A) 09/13/2022 2201   BILIRUBINUR NEGATIVE 09/13/2022 2201   KETONESUR NEGATIVE 09/13/2022 2201   PROTEINUR NEGATIVE 09/13/2022 2201   UROBILINOGEN 0.2 01/09/2013  2111   NITRITE NEGATIVE 09/13/2022 2201   LEUKOCYTESUR NEGATIVE 09/13/2022 2201   Sepsis Labs: @LABRCNTIP (procalcitonin:4,lacticidven:4)  ) Recent Results (from the past 240 hour(s))  Culture, blood (routine x 2)     Status: None (Preliminary result)   Collection Time: 09/14/22  4:26 PM   Specimen: BLOOD RIGHT ARM  Result Value Ref Range Status   Specimen Description BLOOD RIGHT ARM  Final   Special Requests   Final    BOTTLES DRAWN AEROBIC AND ANAEROBIC Blood Culture results may not be optimal due to an inadequate volume of blood received in culture bottles   Culture   Final    NO GROWTH 2 DAYS Performed at [facility], [address], [place], [place]    Report Status PENDING  Incomplete  Culture, blood (routine x 2)     Status: None (Preliminary result)   Collection Time: 09/14/22  4:27 PM   Specimen: BLOOD LEFT ARM  Result Value Ref Range Status   Specimen Description BLOOD LEFT ARM  Final   Special Requests   Final    BOTTLES DRAWN AEROBIC AND ANAEROBIC Blood Culture results may  not be optimal due to an inadequate volume of blood received in culture bottles   Culture   Final    NO GROWTH 2 DAYS Performed at [facility], [address] [place], [place]    Report Status PENDING  Incomplete  Culture, body fluid w Gram Stain-bottle     Status: None (Preliminary result)   Collection Time: 09/14/22  4:48 PM   Specimen: Peritoneal Washings  Result Value Ref Range Status   Specimen Description PERITONEAL  Final   Special Requests NONE  Final   Culture   Final    NO GROWTH 2 DAYS Performed at [facility], [address], [place], [place]    Report Status PENDING  Incomplete  Gram stain     Status: None   Collection Time: 09/14/22  4:48 PM   Specimen: Peritoneal Washings  Result Value Ref Range Status   Specimen Description PERITONEAL  Final   Special Requests NONE  Final   Gram Stain   Final    NO WBC SEEN NO ORGANISMS SEEN Performed at [facility], [address] [place], [place]    Report Status 09/15/2022 FINAL  Final         Radiology Studies: IR Paracentesis  Result Date: 09/14/2022 INDICATION: Cirrhosis, ascites EXAM: ULTRASOUND GUIDED RUQ PARACENTESIS MEDICATIONS: None. COMPLICATIONS: None immediate. PROCEDURE: Informed written consent was obtained from the patient after a discussion of the risks, benefits and alternatives to treatment. A timeout was performed prior to the initiation of the procedure. Initial ultrasound scanning demonstrates a moderate amount of ascites within the right lower abdominal quadrant. The right lower abdomen was prepped and draped in the usual sterile fashion. 1% lidocaine was used for local anesthesia. Following this, a 19 gauge, 7-cm, Yueh catheter was introduced. An ultrasound image was saved for documentation purposes. The paracentesis was performed. The catheter was removed and a dressing was applied. The patient tolerated the procedure well without immediate post  procedural complication. FINDINGS: A total of approximately 4L of ascitic fluid was removed. Samples were sent to the laboratory as requested by the clinical team. IMPRESSION: Successful ultrasound-guided paracentesis yielding 4 liters of peritoneal fluid. PLAN: If the patient eventually requires >/=2 paracenteses in a 30 day period, candidacy for formal evaluation by the  [facility] Inter[facility] Radiology Portal Hypertension [facility] will be assessed. Electronically Signed   By: Gilmer Mor D.O.   On: 09/14/2022 16:48        Scheduled Meds:  midodrine  10 mg Oral TID WC   nicotine  7 mg Transdermal Daily   octreotide  100 mcg Subcutaneous TID   pantoprazole  40 mg Oral BID AC   sertraline  50 mg Oral Daily   sodium chloride flush  3 mL Intravenous Q12H   spironolactone  50 mg Oral Daily   sucralfate  1 g Oral BID   Continuous Infusions:   LOS: 2 days    Time spent: 35 minutes.    Berton Mount, MD  [facility] Pager #: [phone] 7PM-7AM contact night coverage as above

## 2022-09-16 NOTE — Progress Notes (Signed)
[facility] Progress Note   Assessment/ Plan:    AKI on CKD: BP low and new ascites and decomp cirrhosis - UA bland - needs HRS rx- midodrine/ octreotide/ albumin--> on it, Cr improving - try this before terlipressin - GI seeing, appreciate - no more than 4L LVP if possible- possible for another LVP today 12/23 (first one 12/21) - holding lasix/ aldactone   2.  Decomp cirrhosis             - ammonia 50- CTM             - HRS as above             - [facility]   3.  Anemia:             -chekcing iron             - IV if needed   4.  GI ulcers             - PPI   5.  Dispo: admitted  Subjective:    Nauseated today.  Cr continues to decrease with HRS rx.    Objective:   BP 116/73 (BP Location: Left Arm)   Pulse 93   Temp 98.4 F (36.9 C) (Oral)   Resp 16   Ht 5\' 6"  (1.676 m)   Wt 83.5 kg   LMP 03/10/2022 (Approximate)   SpO2 98%   BMI 29.70 kg/m   Physical Exam:  GEN + nauseated HEENT EOMI, PERRL NECK no overt JVD PULM clear CV RRR ABD distende dwith + fluid wave EXT 2+ LE edema, about the same NEURO AAO x 3, no asterixis  Labs: BMET Recent Labs  Lab 09/13/22 2223 09/14/22 1539 09/15/22 0642 09/16/22 0317  NA 136 133* 137 139  K 3.8 3.7 4.0 4.1  CL 105 106 105 108  CO2 23 19* 23 24  GLUCOSE 115* 94 116* 112*  BUN 24* 23* 20 17  CREATININE 3.64* 3.48* 3.06* 2.66*  CALCIUM 8.6* 8.6* 9.1 8.6*   CBC Recent Labs  Lab 09/13/22 2223 09/14/22 1539 09/15/22 0642 09/16/22 0317  WBC 7.8 7.3 6.4 5.0  NEUTROABS 4.3  --   --  2.3  HGB 8.5* 7.2* 8.4* 7.5*  HCT 25.8* 22.0* 25.7* 22.9*  MCV 102.0* 102.8* 102.8* 103.6*  PLT 102* 112* 104* 85*      Medications:     midodrine  10 mg Oral TID WC   nicotine  7 mg Transdermal Daily   octreotide  100 mcg Subcutaneous TID   pantoprazole  40 mg Oral BID AC   sertraline  50 mg Oral Daily   sodium chloride flush  3 mL Intravenous Q12H   spironolactone  50 mg Oral Daily    sucralfate  1 g Oral BID     09/18/22, MD 09/16/2022, 1:19 PM

## 2022-09-17 ENCOUNTER — Inpatient Hospital Stay (HOSPITAL_COMMUNITY): Payer: Self-pay

## 2022-09-17 LAB — RENAL FUNCTION PANEL
Albumin: 1.9 g/dL — ABNORMAL LOW (ref 3.5–5.0)
Anion gap: 3 — ABNORMAL LOW (ref 5–15)
BUN: 17 mg/dL (ref 6–20)
CO2: 24 mmol/L (ref 22–32)
Calcium: 8.2 mg/dL — ABNORMAL LOW (ref 8.9–10.3)
Chloride: 109 mmol/L (ref 98–111)
Creatinine, Ser: 2.66 mg/dL — ABNORMAL HIGH (ref 0.44–1.00)
GFR, Estimated: 22 mL/min — ABNORMAL LOW (ref >60)
Glucose, Bld: 102 mg/dL — ABNORMAL HIGH (ref 70–99)
Phosphorus: 3.1 mg/dL (ref 2.5–4.6)
Potassium: 3.9 mmol/L (ref 3.5–5.1)
Sodium: 136 mmol/L (ref 135–145)

## 2022-09-17 MED ORDER — LIDOCAINE HCL (PF) 1 % IJ SOLN
INTRAMUSCULAR | Status: AC
  Filled 2022-09-17: qty 30

## 2022-09-17 NOTE — Progress Notes (Signed)
PROGRESS NOTE    [name]  QMV:[id number] DOB: [birth year] DOA: 09/13/2022 PCP: [name], NP  Outpatient Specialists:     Brief Narrative:  As per H&P done on admission: "[name] is a 46 y.o. female with medical history significant of HTN, ETOH dependence with cirrhosis, bariatric surgery, bleeding ulcers, and CKD presenting with elevated creatinine per nephrology.  She went to see nephrology on Monday and he did lab work.  He stomach felt like it was swollen.  Her legs are swollen.  He encouraged her to get an appointment with the GI doctor to get her stomach drained, scheduled for tomorrow.  Meanwhile, her creatinine came back at 3.6 and they called her and told her to come to the ER.  Her stomach started swelling last Wednesday.  She has been taking Tylenol for abdominal pain, no help.  Sleeping pills did help some. No prior h/o ascites.  She was told she had cirrhosis in the past and h/o varices.  She has stopped drinking, quit in June.  All issues started 7/16.  She had ulcers and had debridement on her foot and the ulcer on her buttock is healing well per wound care.       ER Course:  Hepatorenal syndrome.  Has alcoholic cirrhosis without h/o ascites.  Recent progressive ascites, tense and uncomfortable.  Saw nephrology Monday, creatinine acutely worsened from 1.4 to 3.6.  K+ ok. INR up, platelets stable.  Hgb stable.  No varices on prior EGD.  GI and nephrology will consult".    09/15/2022: Patient is a 46 year old female with alcoholic liver cirrhosis, admitted with new onset ascites, volume overload and hepatorenal syndrome.  Nephrology and GI is appreciated.  Slight improvement is noted in patient's renal function.  Patient has undergone paracentesis on 4 L of ascitic fluid taken out.  Serum creatinine has improved from 3.64-3.06.  Albumin is 2.4 today.  Patient is currently on midodrine and octreotide.  09/16/2022: No new changes.  Renal function continues to  improve.  Serum creatinine is down to 2.66.  Albumin is 2.1 today. 09/17/2022: Renal function remained stable.  Patient underwent repeat abdominal paracentesis and 1.55 L of fluid fluid removed   Assessment & Plan:   Principal Problem:   Hepatorenal failure (HCC) Active Problems:   Acute kidney injury (HCC)   Thrombocytopenia (HCC)   Tobacco abuse   Ascites due to alcoholic cirrhosis (HCC)   Hepatorenal syndrome -Patient with known alcoholic liver disease with cirrhosis and varices but no prior h/o ascites presenting with marked ascites as well as acute renal failure -Diuretics were held on presentation. -Patient is currently on octreotide and midodrine. -Albumin with paracentesis.  Patient has undergone Foley catheter ultrasound-guided paracentesis. -Slight improvement in renal function. -Input from GI and nephrology team is highly appreciated. -Transplant team has been contacted, awaiting acceptance. -No recent alcohol use. 09/16/2022: Renal function is gradually improving.  Will continue current management. 09/17/2022: Renal function remains stable.   H/o bariatric surgery, recent bleeding ulcer -Recent bleeding ulcer at anastomosis -Continue Protonix   Pressure ulcers -Healing ulcers on left foot and buttocks, per patient   Mood d/o -Continue sertraline, buspirone   ETOH dependence -None since June   Tobacco dependence -Encourage cessation.     DVT prophylaxis: SCD Code Status: Full code Family Communication:  Disposition Plan: This will depend on hospital course.  Referral has been made to a transplant center.   Consultants:  Nephrology GI.  Procedures:  Ultrasound-guided paracentesis.  4 L of ascitic fluid was taken out on 09/14/2022. Repeat abdominal paracentesis (1.55 L removed)  Antimicrobials:  None.   Subjective: -No new complaints. -No fever or chills. -Patient continues to make urine.  Objective: Vitals:   09/17/22 0400 09/17/22 0853  09/17/22 0900 09/17/22 1200  BP: (!) 98/55 108/88 102/85 (!) 102/56  Pulse:      Resp: 16     Temp: 98.2 F (36.8 C)   98.3 F (36.8 C)  TempSrc: Oral   Oral  SpO2:      Weight:      Height:       No intake or output data in the 24 hours ending 09/17/22 1610  Filed Weights   09/13/22 2212  Weight: 83.5 kg    Examination:  General exam: Appears calm and comfortable.  Patient is pale and jaundiced. Respiratory system: Clear to auscultation.  Cardiovascular system: S1 & S2 with systolic murmur.   Gastrointestinal system: Dullness of the flanks.   Central nervous system: Alert and oriented. No focal neurological deficits. Extremities: Significant bilateral lower extremity edema.  Data Reviewed: I have personally reviewed following labs and imaging studies  CBC: Recent Labs  Lab 09/13/22 2223 09/14/22 1539 09/15/22 0642 09/16/22 0317  WBC 7.8 7.3 6.4 5.0  NEUTROABS 4.3  --   --  2.3  HGB 8.5* 7.2* 8.4* 7.5*  HCT 25.8* 22.0* 25.7* 22.9*  MCV 102.0* 102.8* 102.8* 103.6*  PLT 102* 112* 104* 85*    Basic Metabolic Panel: Recent Labs  Lab 09/13/22 2223 09/14/22 1539 09/15/22 0642 09/16/22 0317 09/17/22 1209  NA 136 133* 137 139 136  K 3.8 3.7 4.0 4.1 3.9  CL 105 106 105 108 109  CO2 23 19* 23 24 24   GLUCOSE 115* 94 116* 112* 102*  BUN 24* 23* 20 17 17   CREATININE 3.64* 3.48* 3.06* 2.66* 2.66*  CALCIUM 8.6* 8.6* 9.1 8.6* 8.2*  MG  --   --   --  2.2  --   PHOS  --   --   --   --  3.1    GFR: Estimated Creatinine Clearance: 29.1 mL/min (A) (by C-G formula based on SCr of 2.66 mg/dL (H)). Liver Function Tests: Recent Labs  Lab 09/13/22 2223 09/14/22 1539 09/15/22 0642 09/16/22 0317 09/17/22 1209  AST 62* 63* 51* 45*  --   ALT 24 24 20 17   --   ALKPHOS 164* 151* 141* 108  --   BILITOT 2.0* 1.9* 2.1* 2.3*  --   PROT 7.6 6.9 7.2 6.0*  --   ALBUMIN 1.9* 1.9* 2.4* 2.1* 1.9*    Recent Labs  Lab 09/13/22 2223  LIPASE 37    Recent Labs  Lab  09/13/22 2223  AMMONIA 50*    Coagulation Profile: Recent Labs  Lab 09/13/22 2223 09/14/22 1539 09/15/22 0642  INR 1.6* 1.6* 1.6*    Cardiac Enzymes: No results for input(s): "CKTOTAL", "CKMB", "CKMBINDEX", "TROPONINI" in the last 168 hours. BNP (last 3 results) No results for input(s): "PROBNP" in the last 8760 hours. HbA1C: No results for input(s): "HGBA1C" in the last 72 hours. CBG: No results for input(s): "GLUCAP" in the last 168 hours. Lipid Profile: No results for input(s): "CHOL", "HDL", "LDLCALC", "TRIG", "CHOLHDL", "LDLDIRECT" in the last 72 hours. Thyroid Function Tests: No results for input(s): "TSH", "T4TOTAL", "FREET4", "T3FREE", "THYROIDAB" in the last 72 hours. Anemia Panel: Recent Labs    09/15/22 1335  FERRITIN 709*  TIBC NOT CALCULATED  IRON 61  Urine analysis:    Component Value Date/Time   COLORURINE YELLOW 09/13/2022 2201   APPEARANCEUR HAZY (A) 09/13/2022 2201   LABSPEC 1.015 09/13/2022 2201   PHURINE 5.0 09/13/2022 2201   GLUCOSEU NEGATIVE 09/13/2022 2201   HGBUR SMALL (A) 09/13/2022 2201   BILIRUBINUR NEGATIVE 09/13/2022 2201   KETONESUR NEGATIVE 09/13/2022 2201   PROTEINUR NEGATIVE 09/13/2022 2201   UROBILINOGEN 0.2 01/09/2013 2111   NITRITE NEGATIVE 09/13/2022 2201   LEUKOCYTESUR NEGATIVE 09/13/2022 2201   Sepsis Labs: @LABRCNTIP (procalcitonin:4,lacticidven:4)  ) Recent Results (from the past 240 hour(s))  Culture, blood (routine x 2)     Status: None (Preliminary result)   Collection Time: 09/14/22  4:26 PM   Specimen: BLOOD RIGHT ARM  Result Value Ref Range Status   Specimen Description BLOOD RIGHT ARM  Final   Special Requests   Final    BOTTLES DRAWN AEROBIC AND ANAEROBIC Blood Culture results may not be optimal due to an inadequate volume of blood received in culture bottles   Culture   Final    NO GROWTH 3 DAYS Performed at [facility], [address] [place], [place]    Report Status PENDING   Incomplete  Culture, blood (routine x 2)     Status: None (Preliminary result)   Collection Time: 09/14/22  4:27 PM   Specimen: BLOOD LEFT ARM  Result Value Ref Range Status   Specimen Description BLOOD LEFT ARM  Final   Special Requests   Final    BOTTLES DRAWN AEROBIC AND ANAEROBIC Blood Culture results may not be optimal due to an inadequate volume of blood received in culture bottles   Culture   Final    NO GROWTH 3 DAYS Performed at [facility], [address] [place], [place]    Report Status PENDING  Incomplete  Culture, body fluid w Gram Stain-bottle     Status: None (Preliminary result)   Collection Time: 09/14/22  4:48 PM   Specimen: Peritoneal Washings  Result Value Ref Range Status   Specimen Description PERITONEAL  Final   Special Requests NONE  Final   Culture   Final    NO GROWTH 3 DAYS Performed at [facility], [address] [place], [place]    Report Status PENDING  Incomplete  Gram stain     Status: None   Collection Time: 09/14/22  4:48 PM   Specimen: Peritoneal Washings  Result Value Ref Range Status   Specimen Description PERITONEAL  Final   Special Requests NONE  Final   Gram Stain   Final    NO WBC SEEN NO ORGANISMS SEEN Performed at [facility], [address] [place], [place]    Report Status 09/15/2022 FINAL  Final         Radiology Studies: 09/17/2022 Paracentesis  Result Date: 09/17/2022 INDICATION: Cirrhosis, ascites EXAM: ULTRASOUND GUIDED left lower quadrant therapeutic PARACENTESIS MEDICATIONS: 8 cc 1% lidocaine COMPLICATIONS: None immediate. PROCEDURE: Informed written consent was obtained from the patient after a discussion of the risks, benefits and alternatives to treatment. A timeout was performed prior to the initiation of the procedure. Initial ultrasound scanning demonstrates a large amount of ascites within the left lower abdominal quadrant. The left lower abdomen was prepped and  draped in the usual sterile fashion. 1% lidocaine was used for local anesthesia. Following this, a 19 gauge, 7-cm, Yueh catheter was introduced. An ultrasound image was saved for documentation purposes. The  paracentesis was performed. The catheter was removed and a dressing was applied. The patient tolerated the procedure well without immediate post procedural complication. FINDINGS: A total of approximately 1.55 L of yellow fluid was removed. Ordering provider did not request laboratory samples IMPRESSION: Successful ultrasound-guided paracentesis yielding 1.55 liters of peritoneal fluid. PLAN: The patient has required >/=2 paracenteses in a 30 day period and a formal evaluation [facility] has been arranged. Read by: Mina MarbleMatthew Segal, PA-C Electronically Signed   By: Irish LackGlenn  Yamagata M.D.   On: 09/17/2022 10:24        Scheduled Meds:  feeding supplement (NEPRO CARB STEADY)  237 mL Oral BID BM   midodrine  10 mg Oral TID WC   multivitamin  1 tablet Oral QHS   nicotine  7 mg Transdermal Daily   octreotide  100 mcg Subcutaneous TID   pantoprazole  40 mg Oral BID AC   sertraline  50 mg Oral Daily   sodium chloride flush  3 mL Intravenous Q12H   sucralfate  1 g Oral BID   Continuous Infusions:   LOS: 3 days    Time spent: 35 minutes.    Berton MountSylvester Ilka Lovick, MD  [facility] Pager #: [phone] 7PM-7AM contact night coverage as above

## 2022-09-17 NOTE — Progress Notes (Signed)
[facility] Progress Note   Assessment/ Plan:    AKI on CKD: BP low and new ascites and decomp cirrhosis - UA bland - needs HRS rx- midodrine/ octreotide/ albumin--> on it, Cr improving - try this before terlipressin - GI seeing, appreciate - no more than 4L LVP if possible-  s/p 1.5L LVP, another LVP  12/23 (first one 12/21) - holding lasix/ aldactone   2.  Decomp cirrhosis             - ammonia 50- CTM             - HRS as above             - [facility]   3.  Anemia:             -chekcing iron             - IV if needed   4.  GI ulcers             - PPI   5.  Dispo: admitted  Subjective:    S/p paracentesis today, labs pending.  Feeling better.   Objective:   BP (!) 102/56 (BP Location: Right Arm)   Pulse 65   Temp 98.3 F (36.8 C) (Oral)   Resp 16   Ht 5\' 6"  (1.676 m)   Wt 83.5 kg   LMP 03/10/2022 (Approximate)   SpO2 98%   BMI 29.70 kg/m   Physical Exam:  GEN NAD, sleeping HEENT EOMI, PERRL NECK no overt JVD PULM clear CV RRR ABD distende dwith + fluid wave EXT 2+ LE edema, about the same NEURO AAO x 3, no asterixis  Labs: BMET Recent Labs  Lab 09/13/22 2223 09/14/22 1539 09/15/22 0642 09/16/22 0317 09/17/22 1209  NA 136 133* 137 139 136  K 3.8 3.7 4.0 4.1 3.9  CL 105 106 105 108 109  CO2 23 19* 23 24 24   GLUCOSE 115* 94 116* 112* 102*  BUN 24* 23* 20 17 17   CREATININE 3.64* 3.48* 3.06* 2.66* 2.66*  CALCIUM 8.6* 8.6* 9.1 8.6* 8.2*  PHOS  --   --   --   --  3.1   CBC Recent Labs  Lab 09/13/22 2223 09/14/22 1539 09/15/22 0642 09/16/22 0317  WBC 7.8 7.3 6.4 5.0  NEUTROABS 4.3  --   --  2.3  HGB 8.5* 7.2* 8.4* 7.5*  HCT 25.8* 22.0* 25.7* 22.9*  MCV 102.0* 102.8* 102.8* 103.6*  PLT 102* 112* 104* 85*      Medications:     feeding supplement (NEPRO CARB STEADY)  237 mL Oral BID BM   midodrine  10 mg Oral TID WC   multivitamin  1 tablet Oral QHS   nicotine  7 mg Transdermal Daily   octreotide   100 mcg Subcutaneous TID   pantoprazole  40 mg Oral BID AC   sertraline  50 mg Oral Daily   sodium chloride flush  3 mL Intravenous Q12H   sucralfate  1 g Oral BID     09/16/22, MD 09/17/2022, 12:53 PM

## 2022-09-17 NOTE — Progress Notes (Signed)
[facility] ROUNDING NOTE   Subjective: Continues to feel better.  Paracentesis completed this morning by IR with 1.55 L removed.  No BMP yet for review today.   Objective: Vital signs in last 24 hours: Temp:  [97.9 F (36.6 C)-98.4 F (36.9 C)] 98.2 F (36.8 C) (12/24 0400) Pulse Rate:  [65-93] 65 (12/24 0000) Resp:  [13-17] 16 (12/24 0400) BP: (93-116)/(53-88) 102/85 (12/24 0900) SpO2:  [98 %] 98 % (12/23 1206) Last BM Date : 09/16/22 General: NAD Lungs:  CTA b/l, no w/r/r Heart:  RRR, no m/r/g Abdomen:  Soft, NT, ND, +BS   Intake/Output from previous day: No intake/output data recorded. Intake/Output this shift: No intake/output data recorded.   Lab Results: Recent Labs    09/14/22 1539 09/15/22 0642 09/16/22 0317  WBC 7.3 6.4 5.0  HGB 7.2* 8.4* 7.5*  PLT 112* 104* 85*  MCV 102.8* 102.8* 103.6*   BMET Recent Labs    09/14/22 1539 09/15/22 0642 09/16/22 0317  NA 133* 137 139  K 3.7 4.0 4.1  CL 106 105 108  CO2 19* 23 24  GLUCOSE 94 116* 112*  BUN 23* 20 17  CREATININE 3.48* 3.06* 2.66*  CALCIUM 8.6* 9.1 8.6*   LFT Recent Labs    09/14/22 1539 09/15/22 0642 09/16/22 0317  PROT 6.9 7.2 6.0*  ALBUMIN 1.9* 2.4* 2.1*  AST 63* 51* 45*  ALT 24 20 17   ALKPHOS 151* 141* 108  BILITOT 1.9* 2.1* 2.3*   PT/INR Recent Labs    09/14/22 1539 09/15/22 0642  INR 1.6* 1.6*      Imaging/Other results: 09/17/22 Paracentesis  Result Date: 09/17/2022 INDICATION: Cirrhosis, ascites EXAM: ULTRASOUND GUIDED left lower quadrant therapeutic PARACENTESIS MEDICATIONS: 8 cc 1% lidocaine COMPLICATIONS: None immediate. PROCEDURE: Informed written consent was obtained from the patient after a discussion of the risks, benefits and alternatives to treatment. A timeout was performed prior to the initiation of the procedure. Initial ultrasound scanning demonstrates a large amount of ascites within the left lower abdominal quadrant. The left lower abdomen was  prepped and draped in the usual sterile fashion. 1% lidocaine was used for local anesthesia. Following this, a 19 gauge, 7-cm, Yueh catheter was introduced. An ultrasound image was saved for documentation purposes. The paracentesis was performed. The catheter was removed and a dressing was applied. The patient tolerated the procedure well without immediate post procedural complication. FINDINGS: A total of approximately 1.55 L of yellow fluid was removed. Ordering provider did not request laboratory samples IMPRESSION: Successful ultrasound-guided paracentesis yielding 1.55 liters of peritoneal fluid. PLAN: The patient has required >/=2 paracenteses in a 30 day period and a formal evaluation by the [facility] Inter[facility] Hypertension [facility] has been arranged. Read by: [facility], PA-C Electronically Signed   By: Mina Marble M.D.   On: 09/17/2022 10:24      Assessment and Plan:  1) Decompensated EtOH cirrhosis 2) Abdominal ascites 3) HRS 4) Thrombocytopenia 5) Coagulopathy - Continue octreotide, midodrine - Continue albumin - Repeat BMP check - Holding Lasix/Aldactone until renal function returns to baseline and pending Nephrology recommendations - MRI liver as outpatient to follow-up on elevated AFP - Decision of whether or not to evaluate for TIPS will be dependent on her renal recovery.  Per discussion with Nephrology, if able to return to baseline renal function, plan is to retrial low-dose diuretics and evaluate response/tolerance  6) History of marginal ulcers - Continue pantoprazole - Tolerating p.o. intake  GI service will follow peripherally through  the remainder of the holiday weekend.   If she remains inpatient, Dr. Leonides Schanz will assume her inpatient GI care on 12/26     Shellia Cleverly, DO  09/17/2022, 10:59 AM [facility] Pager [phone]

## 2022-09-17 NOTE — Plan of Care (Signed)

## 2022-09-17 NOTE — Procedures (Signed)
PROCEDURE SUMMARY:  Successful image-guided paracentesis from the left lower abdomen.  Yielded 1.55 liters of yellow fluid.  No immediate complications.  EBL = trace. Patient tolerated well.   Specimen was not sent for labs.  Please see imaging section of Epic for full dictation.   Kennieth Francois PA-C 09/17/2022 9:55 AM

## 2022-09-18 LAB — RENAL FUNCTION PANEL
Albumin: 2.4 g/dL — ABNORMAL LOW (ref 3.5–5.0)
Anion gap: 6 (ref 5–15)
BUN: 16 mg/dL (ref 6–20)
CO2: 23 mmol/L (ref 22–32)
Calcium: 8.5 mg/dL — ABNORMAL LOW (ref 8.9–10.3)
Chloride: 106 mmol/L (ref 98–111)
Creatinine, Ser: 2.63 mg/dL — ABNORMAL HIGH (ref 0.44–1.00)
GFR, Estimated: 22 mL/min — ABNORMAL LOW (ref >60)
Glucose, Bld: 140 mg/dL — ABNORMAL HIGH (ref 70–99)
Phosphorus: 2.9 mg/dL (ref 2.5–4.6)
Potassium: 3.9 mmol/L (ref 3.5–5.1)
Sodium: 135 mmol/L (ref 135–145)

## 2022-09-18 NOTE — Progress Notes (Signed)
PROGRESS NOTE    [name]  JME:[id number] DOB: [birth year] DOA: 09/13/2022 PCP: [name], NP  Outpatient Specialists:     Brief Narrative:  As per H&P done on admission: "[name] is a 46 y.o. female with medical history significant of HTN, ETOH dependence with cirrhosis, bariatric surgery, bleeding ulcers, and CKD presenting with elevated creatinine per nephrology.  She went to see nephrology on Monday and he did lab work.  He stomach felt like it was swollen.  Her legs are swollen.  He encouraged her to get an appointment with the GI doctor to get her stomach drained, scheduled for tomorrow.  Meanwhile, her creatinine came back at 3.6 and they called her and told her to come to the ER.  Her stomach started swelling last Wednesday.  She has been taking Tylenol for abdominal pain, no help.  Sleeping pills did help some. No prior h/o ascites.  She was told she had cirrhosis in the past and h/o varices.  She has stopped drinking, quit in June.  All issues started 7/16.  She had ulcers and had debridement on her foot and the ulcer on her buttock is healing well per wound care.       ER Course:  Hepatorenal syndrome.  Has alcoholic cirrhosis without h/o ascites.  Recent progressive ascites, tense and uncomfortable.  Saw nephrology Monday, creatinine acutely worsened from 1.4 to 3.6.  K+ ok. INR up, platelets stable.  Hgb stable.  No varices on prior EGD.  GI and nephrology will consult".    09/15/2022: Patient is a 46 year old female with alcoholic liver cirrhosis, admitted with new onset ascites, volume overload and hepatorenal syndrome.  Nephrology and GI is appreciated.  Slight improvement is noted in patient's renal function.  Patient has undergone paracentesis on 4 L of ascitic fluid taken out.  Serum creatinine has improved from 3.64-3.06.  Albumin is 2.4 today.  Patient is currently on midodrine and octreotide.  09/16/2022: No new changes.  Renal function continues to  improve.  Serum creatinine is down to 2.66.  Albumin is 2.1 today. 09/17/2022: Renal function remained stable.  Patient underwent repeat abdominal paracentesis and 1.55 L of fluid fluid removed 09/18/2022: Patient seen.  No new complaints today.  Serum creatinine today is 2.63.  Patient will be discharged once cleared for discharge by nephrology and GI team.    Assessment & Plan:   Principal Problem:   Hepatorenal failure (HCC) Active Problems:   Acute kidney injury (HCC)   Thrombocytopenia (HCC)   Tobacco abuse   Ascites due to alcoholic cirrhosis (HCC)   Hepatorenal syndrome -Patient with known alcoholic liver disease with cirrhosis and varices but no prior h/o ascites presenting with marked ascites as well as acute renal failure -Diuretics were held on presentation. -Patient is currently on octreotide and midodrine. -Albumin with paracentesis.  Patient has undergone Foley catheter ultrasound-guided paracentesis. -Slight improvement in renal function. -Input from GI and nephrology team is highly appreciated. -Transplant team has been contacted, awaiting acceptance. -No recent alcohol use. 09/16/2022: Renal function is gradually improving.  Will continue current management. 09/18/2022: Renal function remains stable.   H/o bariatric surgery, recent bleeding ulcer -Recent bleeding ulcer at anastomosis -Continue Protonix   Pressure ulcers -Healing ulcers on left foot and buttocks, per patient   Mood d/o -Continue sertraline, buspirone   ETOH dependence -None since June   Tobacco dependence -Encourage cessation.     DVT prophylaxis: SCD Code Status: Full code Family Communication:  Disposition Plan: This will depend on hospital course.  Referral has been made to a transplant center.   Consultants:  Nephrology GI.  Procedures:  Ultrasound-guided paracentesis.  4 L of ascitic fluid was taken out on 09/14/2022. Repeat abdominal paracentesis (1.55 L  removed)  Antimicrobials:  None.   Subjective: -No new complaints. -No fever or chills. -Patient continues to make urine.  Objective: Vitals:   09/17/22 1200 09/17/22 2057 09/18/22 0036 09/18/22 0458  BP: (!) 102/56 (!) 94/54 (!) 87/40 (!) 99/50  Pulse:  (!) 58 64 66  Resp:  13 (!) 21 19  Temp: 98.3 F (36.8 C) 98 F (36.7 C) 98.5 F (36.9 C) 98.7 F (37.1 C)  TempSrc: Oral Oral Oral Oral  SpO2:  98% 98% 98%  Weight:      Height:        Intake/Output Summary (Last 24 hours) at 09/18/2022 0756 Last data filed at 09/17/2022 2132 Gross per 24 hour  Intake 3 ml  Output --  Net 3 ml    Filed Weights   09/13/22 2212  Weight: 83.5 kg    Examination:  General exam: Appears calm and comfortable.  Patient is pale and jaundiced. Respiratory system: Clear to auscultation.  Cardiovascular system: S1 & S2 with systolic murmur.   Gastrointestinal system: Dullness of the flanks.   Central nervous system: Alert and oriented. No focal neurological deficits. Extremities: Significant bilateral lower extremity edema.  Data Reviewed: I have personally reviewed following labs and imaging studies  CBC: Recent Labs  Lab 09/13/22 2223 09/14/22 1539 09/15/22 0642 09/16/22 0317  WBC 7.8 7.3 6.4 5.0  NEUTROABS 4.3  --   --  2.3  HGB 8.5* 7.2* 8.4* 7.5*  HCT 25.8* 22.0* 25.7* 22.9*  MCV 102.0* 102.8* 102.8* 103.6*  PLT 102* 112* 104* 85*    Basic Metabolic Panel: Recent Labs  Lab 09/13/22 2223 09/14/22 1539 09/15/22 0642 09/16/22 0317 09/17/22 1209  NA 136 133* 137 139 136  K 3.8 3.7 4.0 4.1 3.9  CL 105 106 105 108 109  CO2 23 19* 23 24 24   GLUCOSE 115* 94 116* 112* 102*  BUN 24* 23* 20 17 17   CREATININE 3.64* 3.48* 3.06* 2.66* 2.66*  CALCIUM 8.6* 8.6* 9.1 8.6* 8.2*  MG  --   --   --  2.2  --   PHOS  --   --   --   --  3.1    GFR: Estimated Creatinine Clearance: 29.1 mL/min (A) (by C-G formula based on SCr of 2.66 mg/dL (H)). Liver Function Tests: Recent  Labs  Lab 09/13/22 2223 09/14/22 1539 09/15/22 0642 09/16/22 0317 09/17/22 1209  AST 62* 63* 51* 45*  --   ALT 24 24 20 17   --   ALKPHOS 164* 151* 141* 108  --   BILITOT 2.0* 1.9* 2.1* 2.3*  --   PROT 7.6 6.9 7.2 6.0*  --   ALBUMIN 1.9* 1.9* 2.4* 2.1* 1.9*    Recent Labs  Lab 09/13/22 2223  LIPASE 37    Recent Labs  Lab 09/13/22 2223  AMMONIA 50*    Coagulation Profile: Recent Labs  Lab 09/13/22 2223 09/14/22 1539 09/15/22 0642  INR 1.6* 1.6* 1.6*    Cardiac Enzymes: No results for input(s): "CKTOTAL", "CKMB", "CKMBINDEX", "TROPONINI" in the last 168 hours. BNP (last 3 results) No results for input(s): "PROBNP" in the last 8760 hours. HbA1C: No results for input(s): "HGBA1C" in the last 72 hours. CBG: No results for input(s): "  GLUCAP" in the last 168 hours. Lipid Profile: No results for input(s): "CHOL", "HDL", "LDLCALC", "TRIG", "CHOLHDL", "LDLDIRECT" in the last 72 hours. Thyroid Function Tests: No results for input(s): "TSH", "T4TOTAL", "FREET4", "T3FREE", "THYROIDAB" in the last 72 hours. Anemia Panel: Recent Labs    09/15/22 1335  FERRITIN 709*  TIBC NOT CALCULATED  IRON 61    Urine analysis:    Component Value Date/Time   COLORURINE YELLOW 09/13/2022 2201   APPEARANCEUR HAZY (A) 09/13/2022 2201   LABSPEC 1.015 09/13/2022 2201   PHURINE 5.0 09/13/2022 2201   GLUCOSEU NEGATIVE 09/13/2022 2201   HGBUR SMALL (A) 09/13/2022 2201   BILIRUBINUR NEGATIVE 09/13/2022 2201   KETONESUR NEGATIVE 09/13/2022 2201   PROTEINUR NEGATIVE 09/13/2022 2201   UROBILINOGEN 0.2 01/09/2013 2111   NITRITE NEGATIVE 09/13/2022 2201   LEUKOCYTESUR NEGATIVE 09/13/2022 2201   Sepsis Labs: @LABRCNTIP (procalcitonin:4,lacticidven:4)  ) Recent Results (from the past 240 hour(s))  Culture, blood (routine x 2)     Status: None (Preliminary result)   Collection Time: 09/14/22  4:26 PM   Specimen: BLOOD RIGHT ARM  Result Value Ref Range Status   Specimen Description  BLOOD RIGHT ARM  Final   Special Requests   Final    BOTTLES DRAWN AEROBIC AND ANAEROBIC Blood Culture results may not be optimal due to an inadequate volume of blood received in culture bottles   Culture   Final    NO GROWTH 3 DAYS Performed at [facility], [address], [place], [place]    Report Status PENDING  Incomplete  Culture, blood (routine x 2)     Status: None (Preliminary result)   Collection Time: 09/14/22  4:27 PM   Specimen: BLOOD LEFT ARM  Result Value Ref Range Status   Specimen Description BLOOD LEFT ARM  Final   Special Requests   Final    BOTTLES DRAWN AEROBIC AND ANAEROBIC Blood Culture results may not be optimal due to an inadequate volume of blood received in culture bottles   Culture   Final    NO GROWTH 3 DAYS Performed at [facility], [address] [place], [place]    Report Status PENDING  Incomplete  Culture, body fluid w Gram Stain-bottle     Status: None (Preliminary result)   Collection Time: 09/14/22  4:48 PM   Specimen: Peritoneal Washings  Result Value Ref Range Status   Specimen Description PERITONEAL  Final   Special Requests NONE  Final   Culture   Final    NO GROWTH 3 DAYS Performed at [facility], [address], [place], [place]    Report Status PENDING  Incomplete  Gram stain     Status: None   Collection Time: 09/14/22  4:48 PM   Specimen: Peritoneal Washings  Result Value Ref Range Status   Specimen Description PERITONEAL  Final   Special Requests NONE  Final   Gram Stain   Final    NO WBC SEEN NO ORGANISMS SEEN Performed at [facility], [address], [place], [place]    Report Status 09/15/2022 FINAL  Final         Radiology Studies: 09/17/2022 Paracentesis  Result Date: 09/17/2022 INDICATION: Cirrhosis, ascites EXAM: ULTRASOUND GUIDED left lower quadrant therapeutic PARACENTESIS MEDICATIONS: 8 cc 1% lidocaine COMPLICATIONS: None immediate. PROCEDURE:  Informed written consent was obtained from the patient after a discussion of the risks, benefits and alternatives to treatment. A timeout was  performed prior to the initiation of the procedure. Initial ultrasound scanning demonstrates a large amount of ascites within the left lower abdominal quadrant. The left lower abdomen was prepped and draped in the usual sterile fashion. 1% lidocaine was used for local anesthesia. Following this, a 19 gauge, 7-cm, Yueh catheter was introduced. An ultrasound image was saved for documentation purposes. The paracentesis was performed. The catheter was removed and a dressing was applied. The patient tolerated the procedure well without immediate post procedural complication. FINDINGS: A total of approximately 1.55 L of yellow fluid was removed. Ordering provider did not request laboratory samples IMPRESSION: Successful ultrasound-guided paracentesis yielding 1.55 liters of peritoneal fluid. PLAN: The patient has required >/=2 paracenteses in a 30 day period and a formal evaluation by the [facility] Interventional Radiology Portal Hypertension Clinic has been arranged. Read by: Mina Marble, PA-C Electronically Signed   By: Irish Lack M.D.   On: 09/17/2022 10:24        Scheduled Meds:  feeding supplement (NEPRO CARB STEADY)  237 mL Oral BID BM   midodrine  10 mg Oral TID WC   multivitamin  1 tablet Oral QHS   nicotine  7 mg Transdermal Daily   octreotide  100 mcg Subcutaneous TID   pantoprazole  40 mg Oral BID AC   sertraline  50 mg Oral Daily   sodium chloride flush  3 mL Intravenous Q12H   sucralfate  1 g Oral BID   Continuous Infusions:   LOS: 4 days    Time spent: 35 minutes.    Berton Mount, MD  [facility] Pager #: [phone] 7PM-7AM contact night coverage as above

## 2022-09-18 NOTE — Progress Notes (Signed)
Brief note: Cr seems to have plateaued S/p albumin, on midodrine/ octreotide Depending on labs tomorrow may be able to add back a tiny bit of diuretics but this will need close renal function monitoring  Bufford Buttner MD [employer]

## 2022-09-18 NOTE — Plan of Care (Signed)

## 2022-09-18 NOTE — Plan of Care (Signed)

## 2022-09-19 LAB — CULTURE, BLOOD (ROUTINE X 2)
Culture: NO GROWTH
Culture: NO GROWTH

## 2022-09-19 LAB — RENAL FUNCTION PANEL
Albumin: 1.8 g/dL — ABNORMAL LOW (ref 3.5–5.0)
Anion gap: 5 (ref 5–15)
BUN: 15 mg/dL (ref 6–20)
CO2: 24 mmol/L (ref 22–32)
Calcium: 8.2 mg/dL — ABNORMAL LOW (ref 8.9–10.3)
Chloride: 109 mmol/L (ref 98–111)
Creatinine, Ser: 2.41 mg/dL — ABNORMAL HIGH (ref 0.44–1.00)
GFR, Estimated: 25 mL/min — ABNORMAL LOW (ref >60)
Glucose, Bld: 79 mg/dL (ref 70–99)
Phosphorus: 3.1 mg/dL (ref 2.5–4.6)
Potassium: 3.9 mmol/L (ref 3.5–5.1)
Sodium: 138 mmol/L (ref 135–145)

## 2022-09-19 LAB — CULTURE, BODY FLUID W GRAM STAIN -BOTTLE: Culture: NO GROWTH

## 2022-09-19 MED ORDER — PROMETHAZINE HCL 25 MG PO TABS
12.5000 mg | ORAL_TABLET | Freq: Four times a day (QID) | ORAL | Status: DC | PRN
  Filled 2022-09-19: qty 1

## 2022-09-19 MED ORDER — SIMETHICONE 80 MG PO CHEW
80.0000 mg | CHEWABLE_TABLET | Freq: Four times a day (QID) | ORAL | Status: DC | PRN
  Administered 2022-09-19 – 2022-09-20 (×2): 80 mg via ORAL
  Filled 2022-09-19 (×3): qty 1

## 2022-09-19 NOTE — Plan of Care (Signed)

## 2022-09-19 NOTE — Progress Notes (Signed)
Daily Rounding Note  09/19/2022, 1:36 PM  LOS: 5 days   SUBJECTIVE:   Chief complaint: Decompensated cirrhosis with hepatorenal syndrome.  Some nausea and scant foamy white and sometimes green bilious emesis.  Abdominal distention much better.  LE edema resolved Overall feeling well.    OBJECTIVE:         Vital signs in last 24 hours:    Temp:  [97.9 F (36.6 C)-98.4 F (36.9 C)] 97.9 F (36.6 C) (12/26 1049) Pulse Rate:  [55-65] 62 (12/26 1049) Resp:  [14-19] 19 (12/26 1049) BP: (99-151)/(50-128) 99/50 (12/26 1049) SpO2:  [95 %] 95 % (12/26 0102) Last BM Date : 09/18/22 Filed Weights   09/13/22 2212  Weight: 83.5 kg   General: looks mildly ill.  Comfortable NAD   Heart: RRR Chest: clear bil.  No cough or dyspnea.   Abdomen: moderately tense.  No tenderness.  Active BS  Extremities: scant pitting on top of R foot Neuro/Psych:  no asterixis, tremors, weakness.  No confusion.    Intake/Output from previous day: No intake/output data recorded.  Intake/Output this shift: No intake/output data recorded.  Lab Results: No results for input(s): "WBC", "HGB", "HCT", "PLT" in the last 72 hours. BMET Recent Labs    09/17/22 1209 09/18/22 1046 09/19/22 0754  NA 136 135 138  K 3.9 3.9 3.9  CL 109 106 109  CO2 24 23 24   GLUCOSE 102* 140* 79  BUN 17 16 15   CREATININE 2.66* 2.63* 2.41*  CALCIUM 8.2* 8.5* 8.2*   LFT Recent Labs    09/17/22 1209 09/18/22 1046 09/19/22 0754  ALBUMIN 1.9* 2.4* 1.8*   PT/INR No results for input(s): "LABPROT", "INR" in the last 72 hours. Hepatitis Panel No results for input(s): "HEPBSAG", "HCVAB", "HEPAIGM", "HEPBIGM" in the last 72 hours.  Studies/Results: No results found.  Scheduled Meds:  feeding supplement (NEPRO CARB STEADY)  237 mL Oral BID BM   midodrine  10 mg Oral TID WC   multivitamin  1 tablet Oral QHS   nicotine  7 mg Transdermal Daily   octreotide   100 mcg Subcutaneous TID   pantoprazole  40 mg Oral BID AC   sertraline  50 mg Oral Daily   sodium chloride flush  3 mL Intravenous Q12H   sucralfate  1 g Oral BID   Continuous Infusions: PRN Meds:.bisacodyl, busPIRone, hydrALAZINE, lactulose, ondansetron **OR** ondansetron (ZOFRAN) IV, oxyCODONE, polyethylene glycol  MELD 3.0: 27 at 09/17/2022 12:09 PM MELD-Na: 25 at 09/17/2022 12:09 PM    ASSESMENT:     ETOH use disorder, abstinent since 02/2022.      ETOH cirrhosis, decompensated.  Previous prednisolone to address alcoholic hepatitis in August 2023.  03/2022  HRS (hepatorenal syndrome).  On Octreotide, midodrine.  Received 2 doses of IV albumin on 12/21 and 3rd final dose in a.m. of 12/22.  GFR from 15.. 25 over 1 week.    Hx anastomotic ulcers.  04/18/2022 EGD with esophageal plaques suspicious for candidiasis.  Small HH.  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  Multiple prior EGDs x 8 often demonstrating anastomotic ulcers and/or ulcers at the jejunal side of the gastric pouch.  On Protonix 40 mg po bid, Sucralfate 1 g bid  Abdominal ascites. 4 L paracentesis 12/21, 1.5 L paracentesis 12/24.  No SBP per fluid studies.    Coagulopathy.  INR 1.6  Thrombocytopenia.  Platelets 85 K.      Macrocytic anemia.  Hgb 8.5.. 7.5  over 1 week. No evidence iron, b12, folate deficiency.      PLAN     Leave renal diet in place.      Renal stopping Octreotide.      Follow BMET.      [name]  09/19/2022, 1:36 PM Phone [phone]

## 2022-09-19 NOTE — Progress Notes (Signed)
PROGRESS NOTE    [name]  JGO:[id number] DOB: October 20, 1975 DOA: 09/13/2022 PCP: [name], NP  Outpatient Specialists:     Brief Narrative:  As per H&P done on admission: "[name] is a 46 y.o. female with medical history significant of HTN, ETOH dependence with cirrhosis, bariatric surgery, bleeding ulcers, and CKD presenting with elevated creatinine per nephrology.  She went to see nephrology on Monday and he did lab work.  He stomach felt like it was swollen.  Her legs are swollen.  He encouraged her to get an appointment with the GI doctor to get her stomach drained, scheduled for tomorrow.  Meanwhile, her creatinine came back at 3.6 and they called her and told her to come to the ER.  Her stomach started swelling last Wednesday.  She has been taking Tylenol for abdominal pain, no help.  Sleeping pills did help some. No prior h/o ascites.  She was told she had cirrhosis in the past and h/o varices.  She has stopped drinking, quit in June.  All issues started 7/16.  She had ulcers and had debridement on her foot and the ulcer on her buttock is healing well per wound care.       ER Course:  Hepatorenal syndrome.  Has alcoholic cirrhosis without h/o ascites.  Recent progressive ascites, tense and uncomfortable.  Saw nephrology Monday, creatinine acutely worsened from 1.4 to 3.6.  K+ ok. INR up, platelets stable.  Hgb stable.  No varices on prior EGD.  GI and nephrology will consult".    09/15/2022: Patient is a 46 year old female with alcoholic liver cirrhosis, admitted with new onset ascites, volume overload and hepatorenal syndrome.  Nephrology and GI is appreciated.  Slight improvement is noted in patient's renal function.  Patient has undergone paracentesis on 4 L of ascitic fluid taken out.  Serum creatinine has improved from 3.64-3.06.  Albumin is 2.4 today.  Patient is currently on midodrine and octreotide.  09/16/2022: No new changes.  Renal function continues to  improve.  Serum creatinine is down to 2.66.  Albumin is 2.1 today. 09/17/2022: Renal function remained stable.  Patient underwent repeat abdominal paracentesis and 1.55 L of fluid fluid removed 09/18/2022: Patient seen.  No new complaints today.  Serum creatinine today is 2.63.  Patient will be discharged once cleared for discharge by nephrology and GI team. 09/19/2022: Patient seen.  Patient has continued to improve.  Serum creatinine is 2.41 today.  Nephrology input is appreciated.  Nephrology team has acute resuming some of patient's medications i.e. Aldactone and Lasix.  Will add Aldactone 25 Mg p.o. once daily.  Will be cautious with diuretics.  Assessment & Plan:   Principal Problem:   Hepatorenal failure (HCC) Active Problems:   Acute kidney injury (HCC)   Thrombocytopenia (HCC)   Tobacco abuse   Ascites due to alcoholic cirrhosis (HCC)   Hepatorenal syndrome -Patient with known alcoholic liver disease with cirrhosis and varices but no prior h/o ascites presenting with marked ascites as well as acute renal failure -Diuretics were held on presentation. -Patient is currently on octreotide and midodrine. -Albumin with paracentesis.  Patient has undergone Foley catheter ultrasound-guided paracentesis. -Slight improvement in renal function. -Input from GI and nephrology team is highly appreciated. -Transplant team has been contacted, awaiting acceptance. -No recent alcohol use. 09/16/2022: Renal function is gradually improving.  Will continue current management. 09/19/2022: Renal function is slowly improving.     H/o bariatric surgery, recent bleeding ulcer -Recent bleeding ulcer at  anastomosis -Continue Protonix   Pressure ulcers -Healing ulcers on left foot and buttocks, per patient   Mood d/o -Continue sertraline, buspirone   ETOH dependence -None since June   Tobacco dependence -Encourage cessation.     DVT prophylaxis: SCD Code Status: Full code Family  Communication:  Disposition Plan: This will depend on hospital course.  Referral has been made to a transplant center.   Consultants:  Nephrology GI.  Procedures:  Ultrasound-guided paracentesis.  4 L of ascitic fluid was taken out on 09/14/2022. Repeat abdominal paracentesis (1.55 L removed)  Antimicrobials:  None.   Subjective: -No new complaints. -No fever or chills. -Patient continues to make urine.  Objective: Vitals:   09/18/22 2015 09/19/22 0102 09/19/22 0413 09/19/22 1049  BP: 105/61 (!) 151/128 (!) 105/59 (!) 99/50  Pulse: (!) 55 64 60 62  Resp: 16 14 14 19   Temp: 98.4 F (36.9 C) 98.3 F (36.8 C) 98 F (36.7 C) 97.9 F (36.6 C)  TempSrc: Oral Oral Oral Oral  SpO2:  95%    Weight:      Height:       No intake or output data in the 24 hours ending 09/19/22 1150  Filed Weights   09/13/22 2212  Weight: 83.5 kg    Examination:  General exam: Appears calm and comfortable.  Patient is pale and jaundiced. Respiratory system: Clear to auscultation.  Cardiovascular system: S1 & S2 with systolic murmur.   Gastrointestinal system: Soft and nontender.     Central nervous system: Alert and oriented. No focal neurological deficits. Extremities: No leg edema.    Data Reviewed: I have personally reviewed following labs and imaging studies  CBC: Recent Labs  Lab 09/13/22 2223 09/14/22 1539 09/15/22 0642 09/16/22 0317  WBC 7.8 7.3 6.4 5.0  NEUTROABS 4.3  --   --  2.3  HGB 8.5* 7.2* 8.4* 7.5*  HCT 25.8* 22.0* 25.7* 22.9*  MCV 102.0* 102.8* 102.8* 103.6*  PLT 102* 112* 104* 85*    Basic Metabolic Panel: Recent Labs  Lab 09/15/22 0642 09/16/22 0317 09/17/22 1209 09/18/22 1046 09/19/22 0754  NA 137 139 136 135 138  K 4.0 4.1 3.9 3.9 3.9  CL 105 108 109 106 109  CO2 23 24 24 23 24   GLUCOSE 116* 112* 102* 140* 79  BUN 20 17 17 16 15   CREATININE 3.06* 2.66* 2.66* 2.63* 2.41*  CALCIUM 9.1 8.6* 8.2* 8.5* 8.2*  MG  --  2.2  --   --   --   PHOS  --    --  3.1 2.9 3.1    GFR: Estimated Creatinine Clearance: 32.1 mL/min (A) (by C-G formula based on SCr of 2.41 mg/dL (H)). Liver Function Tests: Recent Labs  Lab 09/13/22 2223 09/14/22 1539 09/15/22 0642 09/16/22 0317 09/17/22 1209 09/18/22 1046 09/19/22 0754  AST 62* 63* 51* 45*  --   --   --   ALT 24 24 20 17   --   --   --   ALKPHOS 164* 151* 141* 108  --   --   --   BILITOT 2.0* 1.9* 2.1* 2.3*  --   --   --   PROT 7.6 6.9 7.2 6.0*  --   --   --   ALBUMIN 1.9* 1.9* 2.4* 2.1* 1.9* 2.4* 1.8*    Recent Labs  Lab 09/13/22 2223  LIPASE 37    Recent Labs  Lab 09/13/22 2223  AMMONIA 50*    Coagulation Profile: Recent Labs  Lab 09/13/22 2223 09/14/22 1539 09/15/22 0642  INR 1.6* 1.6* 1.6*    Cardiac Enzymes: No results for input(s): "CKTOTAL", "CKMB", "CKMBINDEX", "TROPONINI" in the last 168 hours. BNP (last 3 results) No results for input(s): "PROBNP" in the last 8760 hours. HbA1C: No results for input(s): "HGBA1C" in the last 72 hours. CBG: No results for input(s): "GLUCAP" in the last 168 hours. Lipid Profile: No results for input(s): "CHOL", "HDL", "LDLCALC", "TRIG", "CHOLHDL", "LDLDIRECT" in the last 72 hours. Thyroid Function Tests: No results for input(s): "TSH", "T4TOTAL", "FREET4", "T3FREE", "THYROIDAB" in the last 72 hours. Anemia Panel: No results for input(s): "VITAMINB12", "FOLATE", "FERRITIN", "TIBC", "IRON", "RETICCTPCT" in the last 72 hours.  Urine analysis:    Component Value Date/Time   COLORURINE YELLOW 09/13/2022 2201   APPEARANCEUR HAZY (A) 09/13/2022 2201   LABSPEC 1.015 09/13/2022 2201   PHURINE 5.0 09/13/2022 2201   GLUCOSEU NEGATIVE 09/13/2022 2201   HGBUR SMALL (A) 09/13/2022 2201   BILIRUBINUR NEGATIVE 09/13/2022 2201   KETONESUR NEGATIVE 09/13/2022 2201   PROTEINUR NEGATIVE 09/13/2022 2201   UROBILINOGEN 0.2 01/09/2013 2111   NITRITE NEGATIVE 09/13/2022 2201   LEUKOCYTESUR NEGATIVE 09/13/2022 2201   Sepsis  Labs: @LABRCNTIP (procalcitonin:4,lacticidven:4)  ) Recent Results (from the past 240 hour(s))  Culture, blood (routine x 2)     Status: None (Preliminary result)   Collection Time: 09/14/22  4:26 PM   Specimen: BLOOD RIGHT ARM  Result Value Ref Range Status   Specimen Description BLOOD RIGHT ARM  Final   Special Requests   Final    BOTTLES DRAWN AEROBIC AND ANAEROBIC Blood Culture results may not be optimal due to an inadequate volume of blood received in culture bottles   Culture   Final    NO GROWTH 4 DAYS Performed at [facility], [address] [place], [place]    Report Status PENDING  Incomplete  Culture, blood (routine x 2)     Status: None (Preliminary result)   Collection Time: 09/14/22  4:27 PM   Specimen: BLOOD LEFT ARM  Result Value Ref Range Status   Specimen Description BLOOD LEFT ARM  Final   Special Requests   Final    BOTTLES DRAWN AEROBIC AND ANAEROBIC Blood Culture results may not be optimal due to an inadequate volume of blood received in culture bottles   Culture   Final    NO GROWTH 4 DAYS Performed at [facility], [address], [place], [place]    Report Status PENDING  Incomplete  Culture, body fluid w Gram Stain-bottle     Status: None (Preliminary result)   Collection Time: 09/14/22  4:48 PM   Specimen: Peritoneal Washings  Result Value Ref Range Status   Specimen Description PERITONEAL  Final   Special Requests NONE  Final   Culture   Final    NO GROWTH 4 DAYS Performed at [facility], [address], [place], [place]    Report Status PENDING  Incomplete  Gram stain     Status: None   Collection Time: 09/14/22  4:48 PM   Specimen: Peritoneal Washings  Result Value Ref Range Status   Specimen Description PERITONEAL  Final   Special Requests NONE  Final   Gram Stain   Final    NO WBC SEEN NO ORGANISMS SEEN Performed at [facility], [address] [place], [place]     Report Status 09/15/2022 FINAL  Final  Radiology Studies: No results found.      Scheduled Meds:  feeding supplement (NEPRO CARB STEADY)  237 mL Oral BID BM   midodrine  10 mg Oral TID WC   multivitamin  1 tablet Oral QHS   nicotine  7 mg Transdermal Daily   octreotide  100 mcg Subcutaneous TID   pantoprazole  40 mg Oral BID AC   sertraline  50 mg Oral Daily   sodium chloride flush  3 mL Intravenous Q12H   sucralfate  1 g Oral BID   Continuous Infusions:   LOS: 5 days    Time spent: 35 minutes.    Berton Mount, MD  [facility] Pager #: [phone] 7PM-7AM contact night coverage as above

## 2022-09-19 NOTE — Progress Notes (Signed)
[facility] Progress Note   Assessment/ Plan:    AKI on CKD:  Baseline Cr of late in the 1.3-1.4mg /dL range, was 3.6 on admission.  BP low and new ascites and decomp cirrhosis - UA bland - on HRS rx- midodrine/ octreotide/ albumin--> on it, Cr continues to improve from peak 3.6 at admit 12/20 to 2.4 today; as responding holding on terlipressin.  Albumin off now, will hold octreotide as well given not going to likely be a feasible outpt option for her.   - added strict I/Os. - GI seeing, appreciate - no more than 4L LVP if possible-  s/p 1.5L LVP, another LVP  12/23 (first one 12/21) - holding lasix/ aldactone - despite this edema improved.  Likely will resume at d/c.   2.  Decomp cirrhosis: EtOH related, per notes no EtOH since June             - ammonia 50- CTM             - HRS as above             - [facility]  -GI following   3.  Anemia:             -iron and ferritin ok - TIBC and sat not calc  - Hb stable in the 7s-8s  -transfusion and further w/u per primary    4.  GI ulcers: h/o bariatric surgery             - PPI   5.  Dispo: admitted; if kidney function stable off albumin and octreotide would be ok with d/c tomorrow most likely. Will need nephrology f/u at d/c.   Subjective:    Feeling well - says edema markedly improved.   Feeling bloated - to try simethecone per GI.  I/Os not charted.    Objective:   BP (!) 105/59 (BP Location: Left Arm)   Pulse 60   Temp 98 F (36.7 C) (Oral)   Resp 14   Ht 5\' 6"  (1.676 m)   Wt 83.5 kg   LMP 03/10/2022 (Approximate)   SpO2 95%   BMI 29.70 kg/m   Physical Exam:  GEN NAD, awake talking on phone HEENT EOMI, PERRL NECK no overt JVD PULM clear CV RRR ABD soft, no marked distention EXTR very trace LE edema NEURO AAO x 3, no asterixis  Labs: BMET Recent Labs  Lab 09/13/22 2223 09/14/22 1539 09/15/22 0642 09/16/22 0317 09/17/22 1209 09/18/22 1046 09/19/22 0754  NA 136 133* 137  139 136 135 138  K 3.8 3.7 4.0 4.1 3.9 3.9 3.9  CL 105 106 105 108 109 106 109  CO2 23 19* 23 24 24 23 24   GLUCOSE 115* 94 116* 112* 102* 140* 79  BUN 24* 23* 20 17 17 16 15   CREATININE 3.64* 3.48* 3.06* 2.66* 2.66* 2.63* 2.41*  CALCIUM 8.6* 8.6* 9.1 8.6* 8.2* 8.5* 8.2*  PHOS  --   --   --   --  3.1 2.9 3.1    CBC Recent Labs  Lab 09/13/22 2223 09/14/22 1539 09/15/22 0642 09/16/22 0317  WBC 7.8 7.3 6.4 5.0  NEUTROABS 4.3  --   --  2.3  HGB 8.5* 7.2* 8.4* 7.5*  HCT 25.8* 22.0* 25.7* 22.9*  MCV 102.0* 102.8* 102.8* 103.6*  PLT 102* 112* 104* 85*       Medications:     feeding supplement (NEPRO CARB STEADY)  237 mL Oral BID  BM   midodrine  10 mg Oral TID WC   multivitamin  1 tablet Oral QHS   nicotine  7 mg Transdermal Daily   octreotide  100 mcg Subcutaneous TID   pantoprazole  40 mg Oral BID AC   sertraline  50 mg Oral Daily   sodium chloride flush  3 mL Intravenous Q12H   sucralfate  1 g Oral BID  Estill Bakes MD [facility] Pager [phone]

## 2022-09-20 LAB — BASIC METABOLIC PANEL
Anion gap: 6 (ref 5–15)
BUN: 15 mg/dL (ref 6–20)
CO2: 22 mmol/L (ref 22–32)
Calcium: 8.3 mg/dL — ABNORMAL LOW (ref 8.9–10.3)
Chloride: 109 mmol/L (ref 98–111)
Creatinine, Ser: 2.64 mg/dL — ABNORMAL HIGH (ref 0.44–1.00)
GFR, Estimated: 22 mL/min — ABNORMAL LOW (ref >60)
Glucose, Bld: 112 mg/dL — ABNORMAL HIGH (ref 70–99)
Potassium: 3.9 mmol/L (ref 3.5–5.1)
Sodium: 137 mmol/L (ref 135–145)

## 2022-09-20 MED ORDER — ALBUMIN HUMAN 25 % IV SOLN
25.0000 g | Freq: Four times a day (QID) | INTRAVENOUS | Status: AC
  Administered 2022-09-20 – 2022-09-21 (×3): 25 g via INTRAVENOUS
  Filled 2022-09-20 (×3): qty 100

## 2022-09-20 MED ORDER — POLYETHYLENE GLYCOL 3350 17 G PO PACK
17.0000 g | PACK | Freq: Every day | ORAL | Status: DC
  Administered 2022-09-20 – 2022-09-21 (×2): 17 g via ORAL
  Filled 2022-09-20 (×2): qty 1

## 2022-09-20 MED ORDER — MIDODRINE HCL 5 MG PO TABS
15.0000 mg | ORAL_TABLET | Freq: Three times a day (TID) | ORAL | Status: DC
  Administered 2022-09-20 – 2022-09-21 (×4): 15 mg via ORAL
  Filled 2022-09-20 (×3): qty 3

## 2022-09-20 NOTE — Progress Notes (Signed)
Daily Rounding Note  09/20/2022, 12:51 PM  LOS: 6 days   SUBJECTIVE:   Chief complaint: Decompensated cirrhosis with hepatorenal syndrome.  Vomited this morning after eating. She is not sure if Phenergan is helping. Passing gas but denies any stools. Feels like her left leg is more swollen today. Has some abdominal discomfort that feels like cramping  OBJECTIVE:         Vital signs in last 24 hours:    Temp:  [97.7 F (36.5 C)-98.3 F (36.8 C)] 98.3 F (36.8 C) (12/27 0907) Pulse Rate:  [56-77] 56 (12/27 0907) Resp:  [10-20] 10 (12/27 0907) BP: (95-131)/(48-76) 95/55 (12/27 0907) SpO2:  [90 %-99 %] 90 % (12/27 0907) Weight:  [80.4 kg] 80.4 kg (12/27 0500) Last BM Date : 09/18/22 Filed Weights   09/13/22 2212 09/20/22 0500  Weight: 83.5 kg 80.4 kg   General: looks mildly ill.  Comfortable NAD   Heart: RRR Chest: No increased WOB Abdomen: tympanic, mildly tense.  No tenderness.  Active BS  Extremities: LLE edema Neuro/Psych:  no asterixis, tremors, weakness.  No confusion.    Intake/Output from previous day: No intake/output data recorded.  Intake/Output this shift: No intake/output data recorded.  Lab Results: No results for input(s): "WBC", "HGB", "HCT", "PLT" in the last 72 hours. BMET Recent Labs    09/18/22 1046 09/19/22 0754 09/20/22 0055  NA 135 138 137  K 3.9 3.9 3.9  CL 106 109 109  CO2 23 24 22   GLUCOSE 140* 79 112*  BUN 16 15 15   CREATININE 2.63* 2.41* 2.64*  CALCIUM 8.5* 8.2* 8.3*   LFT Recent Labs    09/18/22 1046 09/19/22 0754  ALBUMIN 2.4* 1.8*   PT/INR No results for input(s): "LABPROT", "INR" in the last 72 hours. Hepatitis Panel No results for input(s): "HEPBSAG", "HCVAB", "HEPAIGM", "HEPBIGM" in the last 72 hours.  Studies/Results: No results found.  Scheduled Meds:  feeding supplement (NEPRO CARB STEADY)  237 mL Oral BID BM   midodrine  15 mg Oral TID WC    multivitamin  1 tablet Oral QHS   nicotine  7 mg Transdermal Daily   pantoprazole  40 mg Oral BID AC   polyethylene glycol  17 g Oral Daily   sertraline  50 mg Oral Daily   sodium chloride flush  3 mL Intravenous Q12H   Continuous Infusions:  albumin human     PRN Meds:.bisacodyl, busPIRone, hydrALAZINE, lactulose, ondansetron **OR** ondansetron (ZOFRAN) IV, oxyCODONE, polyethylene glycol, promethazine, simethicone  MELD 3.0: 27 at 09/17/2022 12:09 PM MELD-Na: 25 at 09/17/2022 12:09 PM    ASSESMENT:     ETOH use disorder, abstinent since 02/2022.      ETOH cirrhosis, decompensated.  Previous prednisolone to address alcoholic hepatitis in August 2023.   HRS (hepatorenal syndrome).  On midodrine.  Received 2 doses of IV albumin on 12/21 and 3rd final dose in a.m. of 12/22.  Octreotide stopped 12/26. Discussed case with nephrology and thought may be that her Cr is at a new baseline  Hx anastomotic ulcers.  04/18/2022 EGD with esophageal plaques suspicious for candidiasis.  Small HH.  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  Multiple prior EGDs x 8 often demonstrating anastomotic ulcers and/or ulcers at the jejunal side of the gastric pouch.  On Protonix 40 mg po bid  Abdominal ascites. 4 L paracentesis 12/21, 1.5 L paracentesis 12/24.  No SBP per fluid studies.    Coagulopathy.  Thrombocytopenia.  Macrocytic anemia.  Hgb 8.5.. 7.5 over 1 week. No evidence iron, b12, folate deficiency.     LLE edema. Onset was 12/27  Abdominal distension. Suspected to be due to constipation. She is tympanic on exam. Will start on laxatives  PLAN    Trend Cr  Will give albumin 75 g today  Increased midodrine from 10 mg TID to 15 mg TID   Start Miralax QD to help with constipation likely due to opioid use  Check LLE doppler due to LLE edema   Imogene Burn  09/20/2022, 12:51 PM

## 2022-09-20 NOTE — Plan of Care (Signed)

## 2022-09-20 NOTE — Progress Notes (Signed)
[facility] Progress Note   Assessment/ Plan:    AKI on CKD:  Baseline Cr of late in the 1.3-1.4mg /dL range, was 3.6 on admission.  BP low and new ascites and decomp cirrhosis - UA bland - had HRS rx- midodrine/ octreotide/ albumin --> Cr improved from peak 3.6 to 2.6 where it's been for past 5 days.   Albumin off now, holding octreotide as well given not going to likely be a feasible outpt option for her.   -cont midodrine - added strict I/Os. - GI seeing, appreciate - no more than 4L LVP if possible-  s/p 1.5L LVP, another LVP  12/23 (first one 12/21) - holding lasix/ aldactone - despite this edema improved.  With low BPs would be cautious with diuretics at d/c - at very least would send out with PRN lasix for wt gain/edema but would like to see outpt soon to make sure we've got right regimen.  RD consult re: low sodium diet outpt.  -F/b Dr. Thedore Mins [facility]   2.  Decomp cirrhosis: EtOH related, per notes no EtOH since June             - ammonia 50- CTM             - HRS as above             - [facility]  -GI following here; she says she has appt 09/2022 with [facility] in [place]   3.  Anemia:             -iron and ferritin ok - TIBC and sat not calc  - Hb stable in the 7s-8s  -transfusion and further w/u per primary    4.  GI ulcers: h/o bariatric surgery             - PPI   5.  Dispo: admitted; ok for d/c from my perspective - Will need nephrology f/u at d/c.  Will arrange for 1-2 weeks from now.    Will sign off, please contact with concerns.   Subjective:    Feeling well - says edema improved.    I/Os not charted.  Cr stable in the 2.6 range   Objective:   BP (!) 95/55 (BP Location: Left Arm)   Pulse (!) 56   Temp 98.3 F (36.8 C) (Oral)   Resp 10   Ht 5\' 6"  (1.676 m)   Wt 80.4 kg   LMP 03/10/2022 (Approximate)   SpO2 90%   BMI 28.61 kg/m   Physical Exam:  GEN NAD, awake smiling HEENT EOMI, PERRL NECK no overt JVD PULM  clear CV RRR ABD soft, no marked distention EXTR very trace LE edema more on L than R NEURO AAO x 3, no asterixis  Labs: BMET Recent Labs  Lab 09/14/22 1539 09/15/22 0642 09/16/22 0317 09/17/22 1209 09/18/22 1046 09/19/22 0754 09/20/22 0055  NA 133* 137 139 136 135 138 137  K 3.7 4.0 4.1 3.9 3.9 3.9 3.9  CL 106 105 108 109 106 109 109  CO2 19* 23 24 24 23 24 22   GLUCOSE 94 116* 112* 102* 140* 79 112*  BUN 23* 20 17 17 16 15 15   CREATININE 3.48* 3.06* 2.66* 2.66* 2.63* 2.41* 2.64*  CALCIUM 8.6* 9.1 8.6* 8.2* 8.5* 8.2* 8.3*  PHOS  --   --   --  3.1 2.9 3.1  --     CBC Recent Labs  Lab 09/13/22 2223 09/14/22 1539 09/15/22  3419 09/16/22 0317  WBC 7.8 7.3 6.4 5.0  NEUTROABS 4.3  --   --  2.3  HGB 8.5* 7.2* 8.4* 7.5*  HCT 25.8* 22.0* 25.7* 22.9*  MCV 102.0* 102.8* 102.8* 103.6*  PLT 102* 112* 104* 85*       Medications:     feeding supplement (NEPRO CARB STEADY)  237 mL Oral BID BM   midodrine  10 mg Oral TID WC   multivitamin  1 tablet Oral QHS   nicotine  7 mg Transdermal Daily   pantoprazole  40 mg Oral BID AC   sertraline  50 mg Oral Daily   sodium chloride flush  3 mL Intravenous Q12H  Estill Bakes MD [facility] Pager [phone]

## 2022-09-21 ENCOUNTER — Encounter: Payer: Self-pay | Admitting: Physician Assistant

## 2022-09-21 ENCOUNTER — Telehealth: Payer: Self-pay

## 2022-09-21 ENCOUNTER — Other Ambulatory Visit (HOSPITAL_COMMUNITY): Payer: Self-pay

## 2022-09-21 ENCOUNTER — Inpatient Hospital Stay (HOSPITAL_COMMUNITY): Payer: Self-pay

## 2022-09-21 DIAGNOSIS — M7989 Other specified soft tissue disorders: Secondary | ICD-10-CM

## 2022-09-21 LAB — PROTIME-INR
INR: 1.7 — ABNORMAL HIGH (ref 0.8–1.2)
Prothrombin Time: 19.7 seconds — ABNORMAL HIGH (ref 11.4–15.2)

## 2022-09-21 LAB — CBC
HCT: 28.3 % — ABNORMAL LOW (ref 36.0–46.0)
Hemoglobin: 9.6 g/dL — ABNORMAL LOW (ref 12.0–15.0)
MCH: 34.5 pg — ABNORMAL HIGH (ref 26.0–34.0)
MCHC: 33.9 g/dL (ref 30.0–36.0)
MCV: 101.8 fL — ABNORMAL HIGH (ref 80.0–100.0)
Platelets: 99 10*3/uL — ABNORMAL LOW (ref 150–400)
RBC: 2.78 MIL/uL — ABNORMAL LOW (ref 3.87–5.11)
RDW: 15.9 % — ABNORMAL HIGH (ref 11.5–15.5)
WBC: 7.3 10*3/uL (ref 4.0–10.5)
nRBC: 0 % (ref 0.0–0.2)

## 2022-09-21 LAB — COMPREHENSIVE METABOLIC PANEL
ALT: 19 U/L (ref 0–44)
AST: 56 U/L — ABNORMAL HIGH (ref 15–41)
Albumin: 3.3 g/dL — ABNORMAL LOW (ref 3.5–5.0)
Alkaline Phosphatase: 98 U/L (ref 38–126)
Anion gap: 8 (ref 5–15)
BUN: 15 mg/dL (ref 6–20)
CO2: 25 mmol/L (ref 22–32)
Calcium: 9.2 mg/dL (ref 8.9–10.3)
Chloride: 106 mmol/L (ref 98–111)
Creatinine, Ser: 2.55 mg/dL — ABNORMAL HIGH (ref 0.44–1.00)
GFR, Estimated: 23 mL/min — ABNORMAL LOW (ref >60)
Glucose, Bld: 80 mg/dL (ref 70–99)
Potassium: 3.6 mmol/L (ref 3.5–5.1)
Sodium: 139 mmol/L (ref 135–145)
Total Bilirubin: 3.3 mg/dL — ABNORMAL HIGH (ref 0.3–1.2)
Total Protein: 7.3 g/dL (ref 6.5–8.1)

## 2022-09-21 MED ORDER — MIDODRINE HCL 5 MG PO TABS
15.0000 mg | ORAL_TABLET | Freq: Three times a day (TID) | ORAL | 0 refills | Status: DC
  Filled 2022-09-21: qty 270, 30d supply, fill #0

## 2022-09-21 MED ORDER — LACTULOSE 10 GM/15ML PO SOLN
30.0000 g | Freq: Two times a day (BID) | ORAL | 0 refills | Status: DC | PRN
  Filled 2022-09-21: qty 236, 3d supply, fill #0

## 2022-09-21 MED ORDER — POLYETHYLENE GLYCOL 3350 17 GM/SCOOP PO POWD
17.0000 g | Freq: Every day | ORAL | 0 refills | Status: DC
  Filled 2022-09-21: qty 238, 14d supply, fill #0

## 2022-09-21 MED ORDER — RENA-VITE PO TABS
1.0000 | ORAL_TABLET | Freq: Every day | ORAL | 0 refills | Status: AC
  Filled 2022-09-21: qty 30, 30d supply, fill #0

## 2022-09-21 MED ORDER — NICOTINE 7 MG/24HR TD PT24
7.0000 mg | MEDICATED_PATCH | Freq: Every day | TRANSDERMAL | 0 refills | Status: DC
  Filled 2022-09-21: qty 28, 28d supply, fill #0

## 2022-09-21 MED ORDER — BISACODYL 5 MG PO TBEC
5.0000 mg | DELAYED_RELEASE_TABLET | Freq: Every day | ORAL | 0 refills | Status: DC | PRN
  Filled 2022-09-21: qty 30, 30d supply, fill #0

## 2022-09-21 MED ORDER — NEPRO/CARBSTEADY PO LIQD
237.0000 mL | Freq: Two times a day (BID) | ORAL | 0 refills | Status: DC
  Filled 2022-09-21: qty 7110, 15d supply, fill #0

## 2022-09-21 NOTE — Progress Notes (Signed)
Nutrition Follow-up  DOCUMENTATION CODES:   Not applicable  INTERVENTION:   Diet education Encourage good PO intake  Continue Nepro Shake po BID, each supplement provides 425 kcal and 19 grams protein Continue Renal Multivitamin w/ minerals daily Referral for outpatient education  NUTRITION DIAGNOSIS:   Increased nutrient needs related to chronic illness as evidenced by estimated needs. - Ongoing  GOAL:   Patient will meet greater than or equal to 90% of their needs - Ongoing  MONITOR:   PO intake, Supplement acceptance  REASON FOR ASSESSMENT:   Consult Assessment of nutrition requirement/status  ASSESSMENT:   46 y.o. female admits related to elevated creatinine. PMH includes: HTN, ETOH dependence with cirrhosis, bariatric surgery, bleeding ulcers and CKD. Pt is currently receiving medical management for hepatorenal syndrome.  RD received a consult for diet education. RD provided pt with "Low Sodium Nutrition Therapy" handout. Reviewed material with pt. Reviewed how to read a nutritional facts label. RD answered any questions pt had. Pt agreeable to referral for outpatient education at [facility].   Pt reports that she is eating ok, struggling with the low sodium. No meal intakes have been recorded.    Medications reviewed and include: Rena-vit, Protonix, Miralax Labs reviewed: Creatinine 2.55  Diet Order:   Diet Order             Diet renal with fluid restriction Room service appropriate? Yes; Fluid consistency: Thin  Diet effective now                   EDUCATION NEEDS:   Not appropriate for education at this time  Skin:  Skin Assessment: Reviewed RN Assessment  Last BM:  12/27 - Type 4  Height:   Ht Readings from Last 1 Encounters:  09/13/22 5\' 6"  (1.676 m)    Weight:   Wt Readings from Last 1 Encounters:  09/20/22 80.4 kg    Ideal Body Weight:  59.1 kg  BMI:  Body mass index is 28.61 kg/m.  Estimated Nutritional Needs:  Kcal:   2100-2300 Protein:  105-120 grams Fluid:  >/= 2 L    09/22/22 RD, LDN Clinical Dietitian See [facility] for contact information.

## 2022-09-21 NOTE — Telephone Encounter (Signed)
Message Received: Today Imogene Burn, MD  Emeline Darling, RN Marilynn Rail, this patient is hospitalized right now but will be discharged either today or tomorrow. Could we get her MRI liver scheduled as an outpatient soon? Thanks.

## 2022-09-21 NOTE — Telephone Encounter (Signed)
Pt MRI liver  scheduled for 09/29/2021 at 8:00 AM at the [facility] Location: Pt to arrive at 7:45 AM: NPO 4 hours prior Pt to be notified once discharged from [facility]

## 2022-09-21 NOTE — Discharge Summary (Signed)
Physician Discharge Summary  Patient ID: [name] MRN: [id number] DOB/AGE: [birth year] 45 y.o.  Admit date: 09/13/2022 Discharge date: 09/21/2022  Admission Diagnoses:  Discharge Diagnoses:  Principal Problem:   Hepatorenal failure (HCC) Active Problems:   Acute kidney injury (HCC)   Thrombocytopenia (HCC)   Tobacco abuse   Ascites due to alcoholic cirrhosis [facility])   Discharged Condition: stable  Hospital Course: Patient is a 46 year old female with past medical history significant for hypertension, alcohol dependence with liver cirrhosis, bleeding ulcers, chronic kidney disease and prior history of bariatric surgery.  Patient was admitted and managed for hepatorenal syndrome.  GI team and nephrology team directed patient's care.  Patient underwent paracentesis on 2 occasions.  Patient was managed with albumin, octreotide and midodrine.  Patient has also been referred to the liver transplant team.  Patient has been optimized.  GI team and nephrology team have cleared patient for discharge.  Patient will follow-up with primary care provider, GI team, nephrology and liver transplant team.  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 -Patient was managed with albumin, octreotide and midodrine. -Transplant team has been consulted.   -No recent alcohol use. -Improvement in renal function noted.  Nephrology team is cleared patient for discharge.  H/o bariatric surgery, recent bleeding ulcer -Recent bleeding ulcer at anastomosis -Continue Protonix   Pressure ulcers -Healing ulcers on left foot and buttocks, per patient   Mood d/o -Continue sertraline, buspirone   ETOH dependence -None since June, 2023.   Tobacco dependence -Encouraged cessation.      Consults: GI and nephrology  Significant  Diagnostic Studies:   Discharge Exam: Blood pressure (!) 106/53, pulse 62, temperature 98.7 F (37.1 C), temperature source Oral, resp. rate 18, height 5\' 6"  (1.676 m), weight 80.4 kg, last menstrual period 03/10/2022, SpO2 98 %.   Disposition: Discharge disposition: 01-Home or Self Care       Discharge Instructions     Amb Referral to Nutrition and Diabetic Education   Complete by: As directed    Diet - low sodium heart healthy   Complete by: As directed    Discharge wound care:   Complete by: As directed    Continue current wound care plan   Increase activity slowly   Complete by: As directed       Allergies as of 09/21/2022       Reactions   Tape Rash   Clear tape        Medication List     STOP taking these medications    acetaminophen 500 MG tablet Commonly known as: TYLENOL   furosemide 20 MG tablet Commonly known as: Lasix   loperamide 2 MG capsule Commonly known as: IMODIUM   multivitamin tablet   ondansetron 8 MG tablet Commonly known as: ZOFRAN   spironolactone 50 MG tablet Commonly known as: ALDACTONE   sucralfate 1 g tablet Commonly known as: Carafate       TAKE these medications    B-12 PO Take 1 tablet by mouth daily.   bisacodyl 5 MG EC tablet Commonly known as: DULCOLAX Take 1 tablet (5 mg total) by mouth daily as needed for moderate constipation.   busPIRone 15 MG tablet Commonly known as: BUSPAR Take 15 mg by mouth daily as needed (anxiety).   feeding supplement (NEPRO CARB STEADY) Liqd Take 237 mLs by mouth 2 (  two) times daily between meals. Start taking on: September 22, 2022   ferrous sulfate 325 (65 FE) MG tablet Take 1 tablet (325 mg total) by mouth daily with breakfast.   folic acid 1 MG tablet Commonly known as: FOLVITE Take 1 tablet (1 mg total) by mouth daily.   lactulose 10 GM/15ML solution Commonly known as: CHRONULAC Take 45 mLs (30 g total) by mouth 2 (two) times daily as needed (titrate to 2-3  soft stools per day). What changed:  how much to take when to take this reasons to take this   midodrine 5 MG tablet Commonly known as: PROAMATINE Take 3 tablets (15 mg total) by mouth 3 (three) times daily with meals.   multivitamin Tabs tablet Take 1 tablet by mouth at bedtime.   nicotine 7 mg/24hr patch Commonly known as: NICODERM CQ - dosed in mg/24 hr Place 1 patch (7 mg total) onto the skin daily. Start taking on: September 22, 2022   pantoprazole 40 MG tablet Commonly known as: PROTONIX Take 1 tablet (40 mg total) by mouth 2 (two) times daily before a meal.   polyethylene glycol 17 g packet Commonly known as: MIRALAX / GLYCOLAX Take 17 g by mouth daily. Start taking on: September 22, 2022   sertraline 50 MG tablet Commonly known as: ZOLOFT Take 50 mg by mouth daily.               Discharge Care Instructions  (From admission, onward)           Start     Ordered   09/21/22 0000  Discharge wound care:       Comments: Continue current wound care plan   09/21/22 1608            Time spent: 37 minutes.   SignedBarnetta Chapel 09/21/2022, 4:08 PM

## 2022-09-21 NOTE — Progress Notes (Signed)
[facility] Progress Note  CC:  : Decompensated cirrhosis with hepatorenal syndrome.   Subjective: She complains of having intermittent dry heaves. She reported vomiting up water yesterday. No hematemesis or coffee ground emesis. No abdominal pain. She passed a brown soft stool this morning. No rectal  bleeding or black stools. No CP or SOB.    Objective:  Vital signs in last 24 hours: Temp:  [98.1 F (36.7 C)-98.9 F (37.2 C)] 98.7 F (37.1 C) (12/28 0410) Pulse Rate:  [56-66] 62 (12/28 0000) Resp:  [10-20] 18 (12/28 0000) BP: (94-108)/(53-58) 106/53 (12/28 0000) SpO2:  [90 %-98 %] 98 % (12/27 1747) Last BM Date : 09/18/22 General: Alert 46 year old female in no acute distress Eyes: No scleral icterus. Heart: Regular rate and rhythm, systolic murmur. Pulm: Breath sounds clear with bibasilar crackles. Abdomen: Soft, nondistended.  Mild ascites, abdomen is not tense.  Positive bowel sounds all 4 quadrants.  No palpable mass.  No hepatosplenomegaly. Extremities: Mild bilateral lower extremity edema, L left ankle and foot.  Neurologic:  Alert and  oriented x 4. Grossly normal neurologically.  No asterixis. Psych:  Alert and cooperative. Normal mood and affect.  Intake/Output from previous day: 12/27 0701 - 12/28 0700 In: 64.8 [IV Piggyback:64.8] Out: -  Intake/Output this shift: No intake/output data recorded.  Lab Results: Recent Labs    09/21/22 0703  WBC 7.3  HGB 9.6*  HCT 28.3*  PLT 99*   BMET Recent Labs    09/19/22 0754 09/20/22 0055 09/21/22 0703  NA 138 137 139  K 3.9 3.9 3.6  CL 109 109 106  CO2 24 22 25   GLUCOSE 79 112* 80  BUN 15 15 15   CREATININE 2.41* 2.64* 2.55*  CALCIUM 8.2* 8.3* 9.2   LFT Recent Labs    09/21/22 0703  PROT 7.3  ALBUMIN 3.3*  AST 56*  ALT 19  ALKPHOS 98  BILITOT 3.3*   PT/INR Recent Labs    09/21/22 0703  LABPROT 19.7*  INR 1.7*   Hepatitis Panel No results for input(s): "HEPBSAG", "HCVAB",  "HEPAIGM", "HEPBIGM" in the last 72 hours.  No results found.  Assessment / Plan:  15) 46 year old female admitted to the hospital 09/14/2022 with decompensated alcohol associated cirrhosis (abstinent since 02/2022) with hepatorenal syndrome treated with Octreotide (dc's 12/26), Midodrine and IV albumin per nephrology. Cr level stabilized. S/P paracentesis 12/21 (4L peritoneal fluid removed) and 12/24 (1.5L peritoneal fluid removed). No SBP. No overt hepatic encephalopathy. MELD-Na 26 per labs today. Cr 2.64 -> 2.55.   -Defer diuretic recommendations to nephrology  -? Complete abdominal sonogram during this hospitalization (limited  1/22 paracentesis done12/24/2023) -2gm low sodium/renal diet -Continue Midodrine 15 mg p.o. 3 times daily -BMP in am -Patient has appointment with [place] NP at [facility] 10/02/2021  2) Macrocytic anemia. Hg 7.5 -> 9.6. No overt GI bleeding.   3 Thrombocytopenia, secondary to cirrhosis. No splenomegaly per RUQ sono 04/2022. PLT 99.  4) Coagulopathy. INR 1.7.  5) Constipation. Patient passed a brown soft stool this morning.  -Continue Miralax QD  6) Nausea, dry heaves. Patient vomited up water yesterday without recurrence. No hematemesis.  -Ondansetron 4 mg p.o. or IV every 6 hours as needed, recommend dose of ondansetron 30 minutes prior to meals as needed  7) History of anastomotic ulcers. Most recent EGD 08/14/2022 showed Grade 0-I esophageal varices, too small for EVL, oozing GJ anastomotic ulcer with clip placement.  Multiple prior  EGDs x 8 often demonstrating anastomotic ulcers and/or ulcers at the jejunal side of the gastric pouch. On Protonix 40 mg po bid.  8) Incomplete colonoscopy 08/14/2022, inadequate prep. Recommend repeating colonoscopy once sacral ulcer has completely healed, with a 2 day prep.  Await further recommendations per Dr. Leonides Schanz      Principal Problem:   Hepatorenal failure [facility]) Active Problems:    Acute kidney injury (HCC)   Thrombocytopenia (HCC)   Tobacco abuse   Ascites due to alcoholic cirrhosis (HCC)     LOS: 7 days   Arnaldo Natal  09/21/2022, 09:42AM

## 2022-09-26 ENCOUNTER — Telehealth: Payer: Self-pay

## 2022-09-26 NOTE — Telephone Encounter (Signed)
Transition Care Management Unsuccessful Follow-up Telephone Call  Date of discharge and from where:  09/21/2022, [facility]   Attempts:  1st Attempt  Reason for unsuccessful TCM follow-up call:  Left voice message [phone], call back requested.   Need to discuss scheduling a follow up appointment with PCP

## 2022-09-26 NOTE — Telephone Encounter (Signed)
Pt made aware of the MRI that has been scheduled for 09/29/2021 at 8:00 AM at the [facility] Location: Pt to arrive at 7:45 AM: Nothing to eat or drink  4 hours prior: Pt made aware: Pt verbalized understanding with all questions answered.

## 2022-09-27 ENCOUNTER — Telehealth: Payer: Self-pay

## 2022-09-27 ENCOUNTER — Telehealth: Payer: Self-pay | Admitting: Gastroenterology

## 2022-09-27 ENCOUNTER — Encounter (HOSPITAL_BASED_OUTPATIENT_CLINIC_OR_DEPARTMENT_OTHER): Payer: Self-pay | Admitting: General Surgery

## 2022-09-27 NOTE — Progress Notes (Signed)
   Portal Hypertension Clinic Screening Evaluation   Indication for evaluation: [name] is a 47 y.o. female undergoing preliminary evaluation in the [facility] due to recurrent ascites.  Referring Physician/Established Gastroenterologist:  Christia Reading, MD  Etiology of cirrhosis: EtOH Initially diagnosed: 2023 # of paracentesis in last month: 2 # of paracentesis in last 2 months: 2 History of hepatic hydrothorax:  no History of hepatic encephalopathy: no  Prior evaluation for liver transplant: No (10/02/21 with [facility]) History of hepatocellular carcinoma: no  Prior esophagogastroduodenoscopy/intervention: 08/14/22 (small Grade 0-1 Evs, GJ ulcer Current esophageal varices: yes Current gastric varices: no History of hematemesis: no  Current diuretic regimen: none currently, on hold Current pharmacologic encephalopathy prophylaxis/treatment: lactulose 30 g BID  History of renal dysfunction: yes (hepatorenal syndrome) History of hemodialysis: no  History of cardiac dysfunction: no  Other pertinent past medical history: GERD, anemia, s/p roux en Y gastric bypass   Imaging: Prior cross sectional imaging of portal system: None recent  Echocardiogram:  04/10/22 IMPRESSIONS     1. Left ventricular ejection fraction, by estimation, is >75%. The left  ventricle has hyperdynamic function. The left ventricle has no regional  wall motion abnormalities. Left ventricular diastolic parameters were  normal.   2. Right ventricular systolic function is normal. The right ventricular  size is normal. There is normal pulmonary artery systolic pressure.   3. The mitral valve is normal in structure. Trivial mitral valve  regurgitation. No evidence of mitral stenosis.   4. The aortic valve was not well visualized. Aortic valve regurgitation  is not visualized. No aortic stenosis is present.     Labs: 09/21/22 Creatinine: 2.55 Total Bilirubin: 3.3 INR: 1.7 Sodium: 139 Albumin: 3.3  Child-Pugh = 11 points, class C MELD = 26 (19.6% estimated 3 month mortality) [place] Index of Post-TIPS Survival (FIPS) = 1.23 (overall survival predicted at 1 month 86.8%, 3 months 61.8%, and 6 months 48.2%)    Assessment: [name] is a 47 y.o. female with history of alcoholic cirrhosis (Child Pugh C, MELD 26) with recent onset recurrent ascites.  After preliminary evaluation, this patient would be a poor candidate for TIPS creation given elevated MELD.  If bilirubin were to improve and thus MELD improve, TIPS could be considered particularly if hepatorenal syndrome limits diuretic use.  Recommendation: No further follow up by Interventional Radiology due to poor candidacy for TIPS currently.  Agree with forthcoming transplant evaluation.   Electronically Signed: Suzette Battiest, MD 09/27/2022, 12:28 PM

## 2022-09-27 NOTE — Telephone Encounter (Signed)
Patient called, stated she was at another appointment today and got nauseas and started vomiting. She said her provider said he saw blood in her vomit. Patient has procedure scheduled for 2/14. Wants to be seen sooner with Dr. Lyndel Safe. Was offered 2/7 but she would like sooner appointment with him. Please advise.

## 2022-09-27 NOTE — Telephone Encounter (Signed)
Transition Care Management Unsuccessful Follow-up Telephone Call  Date of discharge and from where:  09/21/2022, [facility]  Attempts:  2nd Attempt  Reason for unsuccessful TCM follow-up call:  Left voice message-  [phone], call back requested.    Need to discuss scheduling a follow up appointment with PCP

## 2022-09-28 ENCOUNTER — Telehealth: Payer: Self-pay

## 2022-09-28 NOTE — Telephone Encounter (Signed)
Pt stated that she had one episode of vomiting yesterday with some bright red blood mixed in. Pt stated that this was only one occurrence and had no nausea or vomiting since then. Pt notified to notify our office immediately if she has any more episodes of vomiting with or with out blood:  Pt stated that her legs have began to be swollen along with abdomen. Pt stated she has an appointment today with her nephrologist. Pt  has MRI tomorrow of Liver: Pt stated that currently she is not taking anything for her reflux. Pt stated that when recently in the ED Dr. Lorenso Courier notified pt that she was going to give her a prescription for Protonix although pt stated that the prescriptions were not sent to pharmacy: Please advise

## 2022-09-28 NOTE — Telephone Encounter (Signed)
Transition Care Management Unsuccessful Follow-up Telephone Call  Date of discharge and from where:  09/21/2022, [facility]  Attempts:  3rd Attempt  Reason for unsuccessful TCM follow-up call:  Left voice message -  [phone], call back requested.   Need to discuss scheduling a follow up appointment with PCP   Letter also sent to patient requesting she contact [facility] to schedule a follow up appointment as we have not been able to reach her.

## 2022-09-29 ENCOUNTER — Ambulatory Visit (HOSPITAL_BASED_OUTPATIENT_CLINIC_OR_DEPARTMENT_OTHER)
Admission: RE | Admit: 2022-09-29 | Discharge: 2022-09-29 | Disposition: A | Discharge status: Home / Self Care | Payer: Self-pay | Source: Ambulatory Visit | Attending: Gastroenterology | Admitting: Gastroenterology

## 2022-09-29 ENCOUNTER — Other Ambulatory Visit: Payer: Self-pay

## 2022-09-29 DIAGNOSIS — K219 Gastro-esophageal reflux disease without esophagitis: Secondary | ICD-10-CM

## 2022-09-29 DIAGNOSIS — K7460 Unspecified cirrhosis of liver: Secondary | ICD-10-CM | POA: Insufficient documentation

## 2022-09-29 DIAGNOSIS — R188 Other ascites: Secondary | ICD-10-CM | POA: Insufficient documentation

## 2022-09-29 DIAGNOSIS — K766 Portal hypertension: Secondary | ICD-10-CM | POA: Insufficient documentation

## 2022-09-29 DIAGNOSIS — R772 Abnormality of alphafetoprotein: Secondary | ICD-10-CM | POA: Insufficient documentation

## 2022-09-29 DIAGNOSIS — R161 Splenomegaly, not elsewhere classified: Secondary | ICD-10-CM | POA: Insufficient documentation

## 2022-09-29 MED ORDER — GADOBUTROL 1 MMOL/ML IV SOLN
7.5000 mL | Freq: Once | INTRAVENOUS | Status: AC | PRN
  Administered 2022-09-29: 7.5 mL via INTRAVENOUS
  Filled 2022-09-29: qty 7.5

## 2022-09-29 MED ORDER — PANTOPRAZOLE SODIUM 40 MG PO TBEC: 40.0000 mg | DELAYED_RELEASE_TABLET | Freq: Every day | ORAL | 4 refills | Status: DC

## 2022-09-29 NOTE — Telephone Encounter (Signed)
Agree with the plan Please call in Protonix 40 mg p.o. daily #90, 4 RF RG

## 2022-09-29 NOTE — Telephone Encounter (Signed)
Pt made aware of Dr. Gupta recommendations: Prescription sent to pharmacy. Pt made aware. Pt verbalized understanding with all questions answered.   

## 2022-10-02 DIAGNOSIS — K7682 Hepatic encephalopathy: Secondary | ICD-10-CM | POA: Diagnosis not present

## 2022-10-02 DIAGNOSIS — E611 Iron deficiency: Secondary | ICD-10-CM | POA: Diagnosis not present

## 2022-10-02 DIAGNOSIS — I8500 Esophageal varices without bleeding: Secondary | ICD-10-CM | POA: Diagnosis not present

## 2022-10-02 DIAGNOSIS — R188 Other ascites: Secondary | ICD-10-CM | POA: Diagnosis not present

## 2022-10-02 DIAGNOSIS — Z1339 Encounter for screening examination for other mental health and behavioral disorders: Secondary | ICD-10-CM | POA: Diagnosis not present

## 2022-10-02 DIAGNOSIS — R791 Abnormal coagulation profile: Secondary | ICD-10-CM | POA: Diagnosis not present

## 2022-10-02 DIAGNOSIS — E440 Moderate protein-calorie malnutrition: Secondary | ICD-10-CM | POA: Diagnosis not present

## 2022-10-02 DIAGNOSIS — K7031 Alcoholic cirrhosis of liver with ascites: Secondary | ICD-10-CM | POA: Diagnosis not present

## 2022-10-02 DIAGNOSIS — R799 Abnormal finding of blood chemistry, unspecified: Secondary | ICD-10-CM | POA: Diagnosis not present

## 2022-10-05 DIAGNOSIS — N1832 Chronic kidney disease, stage 3b: Secondary | ICD-10-CM | POA: Diagnosis not present

## 2022-10-06 ENCOUNTER — Encounter (HOSPITAL_COMMUNITY): Payer: Self-pay

## 2022-10-06 ENCOUNTER — Encounter: Payer: Self-pay | Admitting: Physician Assistant

## 2022-10-06 ENCOUNTER — Other Ambulatory Visit: Payer: Self-pay

## 2022-10-06 ENCOUNTER — Inpatient Hospital Stay (HOSPITAL_COMMUNITY)
Admission: EM | Admit: 2022-10-06 | Discharge: 2022-10-10 | DRG: 441 | Disposition: A | Discharge status: Home / Self Care | Payer: BC Managed Care – PPO | Attending: Internal Medicine | Admitting: Internal Medicine

## 2022-10-06 ENCOUNTER — Telehealth (HOSPITAL_COMMUNITY): Payer: Self-pay | Admitting: Nephrology

## 2022-10-06 DIAGNOSIS — R9431 Abnormal electrocardiogram [ECG] [EKG]: Secondary | ICD-10-CM | POA: Diagnosis not present

## 2022-10-06 DIAGNOSIS — D6959 Other secondary thrombocytopenia: Secondary | ICD-10-CM | POA: Diagnosis present

## 2022-10-06 DIAGNOSIS — E876 Hypokalemia: Secondary | ICD-10-CM | POA: Diagnosis present

## 2022-10-06 DIAGNOSIS — D631 Anemia in chronic kidney disease: Secondary | ICD-10-CM | POA: Diagnosis present

## 2022-10-06 DIAGNOSIS — D62 Acute posthemorrhagic anemia: Secondary | ICD-10-CM | POA: Diagnosis present

## 2022-10-06 DIAGNOSIS — I8511 Secondary esophageal varices with bleeding: Secondary | ICD-10-CM | POA: Diagnosis present

## 2022-10-06 DIAGNOSIS — F17210 Nicotine dependence, cigarettes, uncomplicated: Secondary | ICD-10-CM | POA: Diagnosis present

## 2022-10-06 DIAGNOSIS — N179 Acute kidney failure, unspecified: Secondary | ICD-10-CM | POA: Diagnosis present

## 2022-10-06 DIAGNOSIS — K7031 Alcoholic cirrhosis of liver with ascites: Secondary | ICD-10-CM | POA: Diagnosis present

## 2022-10-06 DIAGNOSIS — F32A Depression, unspecified: Secondary | ICD-10-CM | POA: Diagnosis not present

## 2022-10-06 DIAGNOSIS — I129 Hypertensive chronic kidney disease with stage 1 through stage 4 chronic kidney disease, or unspecified chronic kidney disease: Secondary | ICD-10-CM | POA: Diagnosis not present

## 2022-10-06 DIAGNOSIS — Z980 Intestinal bypass and anastomosis status: Secondary | ICD-10-CM | POA: Diagnosis not present

## 2022-10-06 DIAGNOSIS — D509 Iron deficiency anemia, unspecified: Secondary | ICD-10-CM | POA: Diagnosis present

## 2022-10-06 DIAGNOSIS — E8770 Fluid overload, unspecified: Secondary | ICD-10-CM | POA: Diagnosis present

## 2022-10-06 DIAGNOSIS — Z8711 Personal history of peptic ulcer disease: Secondary | ICD-10-CM

## 2022-10-06 DIAGNOSIS — K767 Hepatorenal syndrome: Principal | ICD-10-CM | POA: Diagnosis present

## 2022-10-06 DIAGNOSIS — Z8051 Family history of malignant neoplasm of kidney: Secondary | ICD-10-CM

## 2022-10-06 DIAGNOSIS — D649 Anemia, unspecified: Secondary | ICD-10-CM | POA: Diagnosis not present

## 2022-10-06 DIAGNOSIS — F419 Anxiety disorder, unspecified: Secondary | ICD-10-CM | POA: Diagnosis present

## 2022-10-06 DIAGNOSIS — E8721 Acute metabolic acidosis: Secondary | ICD-10-CM | POA: Diagnosis present

## 2022-10-06 DIAGNOSIS — N1832 Chronic kidney disease, stage 3b: Secondary | ICD-10-CM | POA: Diagnosis present

## 2022-10-06 DIAGNOSIS — R109 Unspecified abdominal pain: Secondary | ICD-10-CM | POA: Diagnosis not present

## 2022-10-06 DIAGNOSIS — K5520 Angiodysplasia of colon without hemorrhage: Secondary | ICD-10-CM | POA: Diagnosis not present

## 2022-10-06 DIAGNOSIS — D689 Coagulation defect, unspecified: Secondary | ICD-10-CM | POA: Diagnosis present

## 2022-10-06 DIAGNOSIS — K7030 Alcoholic cirrhosis of liver without ascites: Secondary | ICD-10-CM | POA: Diagnosis not present

## 2022-10-06 DIAGNOSIS — R161 Splenomegaly, not elsewhere classified: Secondary | ICD-10-CM | POA: Diagnosis present

## 2022-10-06 DIAGNOSIS — Z79899 Other long term (current) drug therapy: Secondary | ICD-10-CM

## 2022-10-06 DIAGNOSIS — K5521 Angiodysplasia of colon with hemorrhage: Secondary | ICD-10-CM | POA: Diagnosis not present

## 2022-10-06 DIAGNOSIS — R188 Other ascites: Secondary | ICD-10-CM | POA: Diagnosis not present

## 2022-10-06 DIAGNOSIS — I10 Essential (primary) hypertension: Secondary | ICD-10-CM | POA: Diagnosis not present

## 2022-10-06 DIAGNOSIS — Z91048 Other nonmedicinal substance allergy status: Secondary | ICD-10-CM

## 2022-10-06 DIAGNOSIS — I959 Hypotension, unspecified: Secondary | ICD-10-CM | POA: Diagnosis not present

## 2022-10-06 DIAGNOSIS — Z6828 Body mass index (BMI) 28.0-28.9, adult: Secondary | ICD-10-CM

## 2022-10-06 DIAGNOSIS — K7682 Hepatic encephalopathy: Principal | ICD-10-CM | POA: Diagnosis present

## 2022-10-06 DIAGNOSIS — Z800 Family history of malignant neoplasm of digestive organs: Secondary | ICD-10-CM

## 2022-10-06 DIAGNOSIS — Z7682 Awaiting organ transplant status: Secondary | ICD-10-CM | POA: Diagnosis not present

## 2022-10-06 DIAGNOSIS — K766 Portal hypertension: Secondary | ICD-10-CM | POA: Diagnosis present

## 2022-10-06 DIAGNOSIS — Z801 Family history of malignant neoplasm of trachea, bronchus and lung: Secondary | ICD-10-CM

## 2022-10-06 DIAGNOSIS — D539 Nutritional anemia, unspecified: Secondary | ICD-10-CM | POA: Diagnosis present

## 2022-10-06 DIAGNOSIS — E440 Moderate protein-calorie malnutrition: Secondary | ICD-10-CM | POA: Diagnosis not present

## 2022-10-06 DIAGNOSIS — Z9884 Bariatric surgery status: Secondary | ICD-10-CM

## 2022-10-06 DIAGNOSIS — I8500 Esophageal varices without bleeding: Secondary | ICD-10-CM

## 2022-10-06 DIAGNOSIS — K219 Gastro-esophageal reflux disease without esophagitis: Secondary | ICD-10-CM | POA: Diagnosis present

## 2022-10-06 DIAGNOSIS — Z803 Family history of malignant neoplasm of breast: Secondary | ICD-10-CM

## 2022-10-06 DIAGNOSIS — F1021 Alcohol dependence, in remission: Secondary | ICD-10-CM | POA: Diagnosis not present

## 2022-10-06 DIAGNOSIS — K7460 Unspecified cirrhosis of liver: Secondary | ICD-10-CM | POA: Diagnosis not present

## 2022-10-06 DIAGNOSIS — F17200 Nicotine dependence, unspecified, uncomplicated: Secondary | ICD-10-CM | POA: Diagnosis not present

## 2022-10-06 HISTORY — DX: Disorder of kidney and ureter, unspecified: N28.9

## 2022-10-06 LAB — URINALYSIS, ROUTINE W REFLEX MICROSCOPIC
Bilirubin Urine: NEGATIVE
Glucose, UA: NEGATIVE mg/dL
Hgb urine dipstick: NEGATIVE
Ketones, ur: NEGATIVE mg/dL
Leukocytes,Ua: NEGATIVE
Nitrite: NEGATIVE
Protein, ur: NEGATIVE mg/dL
Specific Gravity, Urine: 1.021 (ref 1.005–1.030)
pH: 5 (ref 5.0–8.0)

## 2022-10-06 LAB — CBC
HCT: 23.4 % — ABNORMAL LOW (ref 36.0–46.0)
HCT: 21.7 % — ABNORMAL LOW (ref 36.0–46.0)
Hemoglobin: 7.7 g/dL — ABNORMAL LOW (ref 12.0–15.0)
Hemoglobin: 7.2 g/dL — ABNORMAL LOW (ref 12.0–15.0)
MCH: 35.2 pg — ABNORMAL HIGH (ref 26.0–34.0)
MCH: 35 pg — ABNORMAL HIGH (ref 26.0–34.0)
MCHC: 32.9 g/dL (ref 30.0–36.0)
MCHC: 33.2 g/dL (ref 30.0–36.0)
MCV: 106.8 fL — ABNORMAL HIGH (ref 80.0–100.0)
MCV: 105.3 fL — ABNORMAL HIGH (ref 80.0–100.0)
Platelets: 66 10*3/uL — ABNORMAL LOW (ref 150–400)
Platelets: 54 10*3/uL — ABNORMAL LOW (ref 150–400)
RBC: 2.19 MIL/uL — ABNORMAL LOW (ref 3.87–5.11)
RBC: 2.06 MIL/uL — ABNORMAL LOW (ref 3.87–5.11)
RDW: 20 % — ABNORMAL HIGH (ref 11.5–15.5)
RDW: 20.2 % — ABNORMAL HIGH (ref 11.5–15.5)
WBC: 8.1 10*3/uL (ref 4.0–10.5)
WBC: 7 10*3/uL (ref 4.0–10.5)
nRBC: 0 % (ref 0.0–0.2)
nRBC: 0 % (ref 0.0–0.2)

## 2022-10-06 LAB — COMPREHENSIVE METABOLIC PANEL
ALT: 26 U/L (ref 0–44)
AST: 70 U/L — ABNORMAL HIGH (ref 15–41)
Albumin: 2.5 g/dL — ABNORMAL LOW (ref 3.5–5.0)
Alkaline Phosphatase: 131 U/L — ABNORMAL HIGH (ref 38–126)
Anion gap: 9 (ref 5–15)
BUN: 26 mg/dL — ABNORMAL HIGH (ref 6–20)
CO2: 18 mmol/L — ABNORMAL LOW (ref 22–32)
Calcium: 8.6 mg/dL — ABNORMAL LOW (ref 8.9–10.3)
Chloride: 106 mmol/L (ref 98–111)
Creatinine, Ser: 3.16 mg/dL — ABNORMAL HIGH (ref 0.44–1.00)
GFR, Estimated: 18 mL/min — ABNORMAL LOW (ref >60)
Glucose, Bld: 101 mg/dL — ABNORMAL HIGH (ref 70–99)
Potassium: 3 mmol/L — ABNORMAL LOW (ref 3.5–5.1)
Sodium: 133 mmol/L — ABNORMAL LOW (ref 135–145)
Total Bilirubin: 2 mg/dL — ABNORMAL HIGH (ref 0.3–1.2)
Total Protein: 7.1 g/dL (ref 6.5–8.1)

## 2022-10-06 LAB — CREATININE, SERUM
Creatinine, Ser: 3.19 mg/dL — ABNORMAL HIGH (ref 0.44–1.00)
GFR, Estimated: 18 mL/min — ABNORMAL LOW (ref >60)

## 2022-10-06 LAB — PROTIME-INR
INR: 1.5 — ABNORMAL HIGH (ref 0.8–1.2)
Prothrombin Time: 17.5 seconds — ABNORMAL HIGH (ref 11.4–15.2)

## 2022-10-06 LAB — LACTIC ACID, PLASMA
Lactic Acid, Venous: 2.4 mmol/L (ref 0.5–1.9)
Lactic Acid, Venous: 2.5 mmol/L (ref 0.5–1.9)

## 2022-10-06 LAB — LIPASE, BLOOD: Lipase: 35 U/L (ref 11–51)

## 2022-10-06 LAB — AMMONIA: Ammonia: 89 umol/L — ABNORMAL HIGH (ref 9–35)

## 2022-10-06 MED ORDER — HYDROMORPHONE HCL 1 MG/ML IJ SOLN
0.5000 mg | INTRAMUSCULAR | Status: AC
  Administered 2022-10-06: 0.5 mg via INTRAVENOUS
  Filled 2022-10-06: qty 1

## 2022-10-06 MED ORDER — MIDODRINE HCL 5 MG PO TABS
15.0000 mg | ORAL_TABLET | Freq: Three times a day (TID) | ORAL | Status: DC
  Administered 2022-10-07 – 2022-10-08 (×4): 15 mg via ORAL
  Filled 2022-10-06 (×4): qty 3

## 2022-10-06 MED ORDER — ALBUMIN HUMAN 25 % IV SOLN
75.0000 g | Freq: Once | INTRAVENOUS | Status: AC
  Administered 2022-10-06: 75 g via INTRAVENOUS
  Filled 2022-10-06 (×4): qty 300

## 2022-10-06 MED ORDER — HEPARIN SODIUM (PORCINE) 5000 UNIT/ML IJ SOLN
5000.0000 [IU] | Freq: Three times a day (TID) | INTRAMUSCULAR | Status: DC
  Administered 2022-10-06 – 2022-10-07 (×2): 5000 [IU] via SUBCUTANEOUS
  Filled 2022-10-06 (×2): qty 1

## 2022-10-06 MED ORDER — POTASSIUM CHLORIDE 20 MEQ PO PACK
40.0000 meq | PACK | Freq: Two times a day (BID) | ORAL | Status: DC
  Administered 2022-10-06: 40 meq via ORAL
  Filled 2022-10-06: qty 2

## 2022-10-06 MED ORDER — LACTULOSE 10 GM/15ML PO SOLN
30.0000 g | Freq: Two times a day (BID) | ORAL | Status: DC
  Administered 2022-10-07 (×3): 30 g via ORAL
  Filled 2022-10-06 (×5): qty 45

## 2022-10-06 NOTE — Telephone Encounter (Signed)
Spoke to patient this AM after she had presented to [place] for hospital follow-up on 10/05/2022.  S/p admit in Dec with HRS - Cr 3.6 on admit, some diuresis with midodrine, IV albumin, octeotide. Disc home on midodrine and no diuretics. She had 1st eval with [facility] on 10/02/22 - Cr 2.65 at that time - will be starting the process for liver transplant eval at the end of the month.  On 1/11 - edema was worse, she was hypotensive and feeling quite miserable. We checked labs and discussed adding Lasix 40mg  again.  Labs resulted this AM - Cr back up to 3.1 (GFR 18), K low 3.1, CO2 low 16. Discussed patient with Dr. Candiss Norse - he primary nephrologist - worry that renal function will continue to tank as outpatient and he recommends that she presents to ED for further evaluation.  She reports that she cannot drive to [facility] for admit - will plan to come to [facility] ED for assessment and repeat labs.   Veneta Penton, PA-C [employer] Pager [phone]

## 2022-10-06 NOTE — ED Provider Triage Note (Signed)
Emergency Medicine Provider Triage Evaluation Note  [name] , a 47 y.o. female  was evaluated in triage.  Pt complains of abdominal pain, weakness, with a history of hepatorenal syndrome, supposed to undergo liver transplant discussion due to failing liver, she has been having worsening kidney function, hypokalemia, bicarb deficit per her nephrologist and was told to come to the emergency department for evaluation.  Review of Systems  Positive: Abdominal pain, weakness, kidney, liver failure Negative: Chest pain  Physical Exam  BP 105/65   Pulse 89   Temp 98.3 F (36.8 C) (Oral)   Resp 16   Wt 78.5 kg   LMP 03/10/2022 (Approximate)   SpO2 100%   BMI 27.92 kg/m  Gen:   Awake, ill appearing Resp:  Normal effort  MSK:   Moves extremities without difficulty  Other:  Distended, tender abdomen without overt rigidity, redness, evidence of worsening ascites  Medical Decision Making  Medically screening exam initiated at 6:24 PM.  Appropriate orders placed.  [name] was informed that the remainder of the evaluation will be completed by another provider, this initial triage assessment does not replace that evaluation, and the importance of remaining in the ED until their evaluation is complete.  Workup initiated   Dorien Chihuahua 10/06/22 1824

## 2022-10-06 NOTE — ED Provider Notes (Signed)
[facility] EMERGENCY DEPARTMENT Provider Note   CSN: [id number] Arrival date & time: 10/06/22  1707     History Chief Complaint  Patient presents with   Abdominal Pain    HPI [name] is a 47 y.o. female presenting for abdominal distention but more importantly for outpatient labs showing progressive kidney dysfunction.  She has an exquisite medical history including hepatorenal syndrome, cirrhosis and [facility].  She comes in today because of outpatient labs showing worsening of her kidney dysfunction.  Was just admitted for an extended amount of time for hepatorenal syndrome She denies abdominal pain, fevers, chills, syncope or shortness of breath.  She is otherwise ambulatory tolerating p.o. intake..   Patient's recorded medical, surgical, social, medication list and allergies were reviewed in the Snapshot window as part of the initial history.   Review of Systems   Review of Systems  Constitutional:  Negative for chills and fever.  HENT:  Negative for ear pain and sore throat.   Eyes:  Negative for pain and visual disturbance.  Respiratory:  Negative for cough and shortness of breath.   Cardiovascular:  Negative for chest pain and palpitations.  Gastrointestinal:  Positive for abdominal distention. Negative for abdominal pain and vomiting.  Genitourinary:  Negative for dysuria and hematuria.  Musculoskeletal:  Negative for arthralgias and back pain.  Skin:  Negative for color change and rash.  Neurological:  Negative for seizures and syncope.  All other systems reviewed and are negative.   Physical Exam Updated Vital Signs BP 102/60   Pulse 86   Temp 97.9 F (36.6 C) (Oral)   Resp 18   Wt 78.5 kg   LMP 03/10/2022 (Approximate)   SpO2 99%   BMI 27.92 kg/m  Physical Exam Vitals and nursing note reviewed.  Constitutional:      General: She is not in acute distress.    Appearance: She is  well-developed.  HENT:     Head: Normocephalic and atraumatic.  Eyes:     Conjunctiva/sclera: Conjunctivae normal.  Cardiovascular:     Rate and Rhythm: Normal rate and regular rhythm.     Heart sounds: No murmur heard. Pulmonary:     Effort: Pulmonary effort is normal. No respiratory distress.     Breath sounds: Normal breath sounds.  Abdominal:     General: There is distension.     Palpations: Abdomen is soft. There is shifting dullness and fluid wave.     Tenderness: There is no abdominal tenderness. There is no right CVA tenderness or left CVA tenderness.  Musculoskeletal:        General: No swelling or tenderness. Normal range of motion.     Cervical back: Neck supple.  Skin:    General: Skin is warm and dry.  Neurological:     General: No focal deficit present.     Mental Status: She is alert and oriented to person, place, and time. Mental status is at baseline.     Cranial Nerves: No cranial nerve deficit.      ED Course/ Medical Decision Making/ A&P Clinical Course as of 10/06/22 2317  Fri Oct 06, 2022  2048 Discussed with Dr. Allena Katz of nephrology. Agrees that it may be early hepatorenal syndrome though it still too early to tell.  Since creatinine is rising and there is concern from outpatient nephrologist, he recommended starting albumin 1 g/kg for 3 days He recommended restarting patient's home midodrine at 15  mg 3 times daily He recommended replacing potassium p.o. and admitting to hospital for consultation, further diagnostic eval, lab trending. [CC]    Clinical Course User Index [CC] Tretha Sciara, MD    Procedures .Critical Care  Performed by: Tretha Sciara, MD Authorized by: Tretha Sciara, MD   Critical care provider statement:    Critical care time (minutes):  30   Critical care was necessary to treat or prevent imminent or life-threatening deterioration of the following conditions:  Hepatic failure   Critical care was time spent personally  by me on the following activities:  Development of treatment plan with patient or surrogate, discussions with consultants, evaluation of patient's response to treatment, examination of patient, ordering and review of laboratory studies, ordering and review of radiographic studies, ordering and performing treatments and interventions, pulse oximetry, re-evaluation of patient's condition and review of old charts   Care discussed with: admitting provider      Medications Ordered in ED Medications  midodrine (PROAMATINE) tablet 15 mg (has no administration in time range)  potassium chloride (KLOR-CON) packet 40 mEq (40 mEq Oral Given 10/06/22 2243)  heparin injection 5,000 Units (5,000 Units Subcutaneous Given 10/06/22 2245)  lactulose (CHRONULAC) 10 GM/15ML solution 30 g (has no administration in time range)  albumin human 25 % solution 75 g (75 g Intravenous New Bag/Given 10/06/22 2246)  HYDROmorphone (DILAUDID) injection 0.5 mg (0.5 mg Intravenous Given 10/06/22 2245)    Medical Decision Making:    [name] is a 47 y.o. female who presented to the ED today with multiple complaints detailed above.     Patient's presentation is complicated by their history of cirrhosis, hepatorenal syndrome in the past.  Patient placed on continuous vitals and telemetry monitoring while in ED which was reviewed periodically.   Complete initial physical exam performed, notably the patient  was hemodynamically stable in no acute distress.  She has obvious fluid distention in her abdomen.      Reviewed and confirmed nursing documentation for past medical history, family history, social history.    Initial Assessment:   Patient's findings of outpatient labs with worsening kidney dysfunction, recurrent collection of ascitic fluid, progressive liver dysfunction is concerning for recurrence of hepatorenal syndrome secondary to her cirrhosis.  This is a life-threatening condition.  This is most consistent with an  acute life/limb threatening illness complicated by underlying chronic conditions. Also considered SBP though this seems less likely due to lack of fever or chill.  Considered intra-abdominal pathology such as appendicitis, cholecystitis, small bowel obstruction but again seems less likely. Initial Plan:  Emergent consult with nephrology detailed above Screening labs including CBC and Metabolic panel to evaluate for infectious or metabolic etiology of disease.  Urinalysis with reflex culture ordered to evaluate for UTI or relevant urologic/nephrologic pathology.  CXR to evaluate for structural/infectious intrathoracic pathology.  EKG to evaluate for cardiac pathology. Objective evaluation as below reviewed with plan for close reassessment  Initial Study Results:   Consults:  Case discussed with nephrology.  See ED course for complete details.   Final Assessment and Plan:   Ultimately, patient required albumin, close monitoring and admission to medicine for further care and management of recurrent hepatorenal syndrome.   Disposition:   Based on the above findings, I believe this patient is stable for admission.    Patient/family educated about specific findings on our evaluation and explained exact reasons for admission.  Patient/family educated about clinical situation and time was allowed to answer questions.  Admission team communicated with and agreed with need for admission. Patient admitted. Patient ready to move at this time.     Emergency Department Medication Summary:   Medications  midodrine (PROAMATINE) tablet 15 mg (has no administration in time range)  potassium chloride (KLOR-CON) packet 40 mEq (40 mEq Oral Given 10/06/22 2243)  heparin injection 5,000 Units (5,000 Units Subcutaneous Given 10/06/22 2245)  lactulose (CHRONULAC) 10 GM/15ML solution 30 g (has no administration in time range)  albumin human 25 % solution 75 g (75 g Intravenous New Bag/Given 10/06/22 2246)   HYDROmorphone (DILAUDID) injection 0.5 mg (0.5 mg Intravenous Given 10/06/22 2245)         Clinical Impression:  1. 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[place])      Admit   Final Clinical Impression(s) / ED Diagnoses Final diagnoses:  Hepatorenal syndrome [facility])    Rx / DC Orders ED Discharge Orders     None         Tretha Sciara, MD 10/06/22 2317

## 2022-10-06 NOTE — ED Notes (Signed)
ED TO INPATIENT HANDOFF REPORT  ED Nurse Name and Phone #: Andi Hence, RN  S Name/Age/Gender [name] 47 y.o. female Room/Bed: 025C/025C  Code Status   Code Status: Full Code  Home/SNF/Other Home Patient oriented to: self, place, time, and situation Is this baseline? Yes   Triage Complete: Triage complete  Chief Complaint AKI (acute kidney injury) ([place]) [N17.9]  Triage Note Pt sent by nephrologist for evaluation hypokalemia and increased renal insufficieny; endorses abdominal pain and swelling; pt states she needs a paracentesis per her nephrologist   Allergies Allergies  Allergen Reactions   Tape Rash    Clear tape    Level of Care/Admitting Diagnosis ED Disposition     ED Disposition  Admit   Condition  --   [place]: [place] [100100]  Level of Care: Telemetry Medical [104]  May admit patient to [facility] or [facility] if equivalent level of care is available:: Yes  Covid Evaluation: Asymptomatic - no recent exposure (last 10 days) testing not required  Diagnosis: AKI (acute kidney injury) [facility]) [[id number]]  Admitting Physician: Kayleen Memos [[id number]]  Attending Physician: Kayleen Memos [[id number]]  Certification:: I certify this patient will need inpatient services for at least 2 midnights  Estimated Length of Stay: 2          B Medical/Surgery History Past Medical History:  Diagnosis Date   Acid reflux    Alcohol dependence in remission ([place])    Anemia    Renal disorder    Ulcer    Past Surgical History:  Procedure Laterality Date   ABDOMINAL SURGERY     abdominoplasty   BIOPSY  04/18/2022   Procedure: BIOPSY;  Surgeon: Jackquline Denmark, MD;  Location: [place];  Service: Gastroenterology;;   BREAST BIOPSY Right 2013   CHOLECYSTECTOMY N/A 11/23/2019   Procedure: LAPAROSCOPIC CHOLECYSTECTOMY WITH INTRAOPERATIVE CHOLANGIOGRAM;  Surgeon: Mickeal Skinner, MD;  Location: [place];  Service:  General;  Laterality: N/A;   COSMETIC SURGERY     tummy tuck   ESOPHAGOGASTRODUODENOSCOPY (EGD) WITH PROPOFOL N/A 04/18/2022   Procedure: ESOPHAGOGASTRODUODENOSCOPY (EGD) WITH PROPOFOL;  Surgeon: Jackquline Denmark, MD;  Location: [place];  Service: Gastroenterology;  Laterality: N/A;   FOOT SURGERY     HERNIA REPAIR     I & D EXTREMITY Left 06/21/2022   Procedure: IRRIGATION AND DEBRIDEMENT EXTREMITY Debridement of Left Foot/APPLICATION OF GRAFT;  Surgeon: Yevonne Pax, DPM;  Location: [facility];  Service: Podiatry;  Laterality: Left;   IR PARACENTESIS  09/14/2022   ROUX-EN-Y PROCEDURE     TUMOR REMOVAL     WRIST GANGLION EXCISION       A IV Location/Drains/Wounds Patient Lines/Drains/Airways Status     Active Line/Drains/Airways     Name Placement date Placement time Site Days   Peripheral IV 10/06/22 22 G Right Forearm 10/06/22  2244  Forearm  less than 1   Wound / Incision (Open or Dehisced) 04/13/22 Non-pressure wound Buttocks Left;Medial partial thickness fissure related to moisture 04/13/22  2150  Buttocks  176            Intake/Output Last 24 hours No intake or output data in the 24 hours ending 10/06/22 2308  Labs/Imaging Results for orders placed or performed during the hospital encounter of 10/06/22 (from the past 48 hour(s))  Lipase, blood     Status: None   Collection Time: 10/06/22  6:21 PM  Result Value Ref Range   Lipase 35  11 - 51 U/L    Comment: Performed at [facility], [place] [address], [place], [place] [address]  Comprehensive metabolic panel     Status: Abnormal   Collection Time: 10/06/22  6:21 PM  Result Value Ref Range   Sodium 133 (L) 135 - 145 mmol/L   Potassium 3.0 (L) 3.5 - 5.1 mmol/L   Chloride 106 98 - 111 mmol/L   CO2 18 (L) 22 - 32 mmol/L   Glucose, Bld 101 (H) 70 - 99 mg/dL    Comment: Glucose reference range applies only to samples taken after fasting for at least 8 hours.   BUN 26 (H) 6 - 20 mg/dL   Creatinine, Ser  3.16 (H) 0.44 - 1.00 mg/dL   Calcium 8.6 (L) 8.9 - 10.3 mg/dL   Total Protein 7.1 6.5 - 8.1 g/dL   Albumin 2.5 (L) 3.5 - 5.0 g/dL   AST 70 (H) 15 - 41 U/L   ALT 26 0 - 44 U/L   Alkaline Phosphatase 131 (H) 38 - 126 U/L   Total Bilirubin 2.0 (H) 0.3 - 1.2 mg/dL   GFR, Estimated 18 (L) >60 mL/min    Comment: (NOTE) Calculated using the CKD-EPI Creatinine Equation (2021)    Anion gap 9 5 - 15    Comment: Performed at [facility] [address], [place], [place] [address]  CBC     Status: Abnormal   Collection Time: 10/06/22  6:21 PM  Result Value Ref Range   WBC 8.1 4.0 - 10.5 K/uL   RBC 2.19 (L) 3.87 - 5.11 MIL/uL   Hemoglobin 7.7 (L) 12.0 - 15.0 g/dL   HCT 23.4 (L) 36.0 - 46.0 %   MCV 106.8 (H) 80.0 - 100.0 fL   MCH 35.2 (H) 26.0 - 34.0 pg   MCHC 32.9 30.0 - 36.0 g/dL   RDW 20.0 (H) 11.5 - 15.5 %   Platelets 66 (L) 150 - 400 K/uL    Comment: Immature Platelet Fraction may be clinically indicated, consider ordering this additional test [id number] REPEATED TO VERIFY PLATELET COUNT CONFIRMED BY SMEAR    nRBC 0.0 0.0 - 0.2 %    Comment: Performed at [facility], [place] [address] [place], [place] [address]  Lactic acid, plasma     Status: Abnormal   Collection Time: 10/06/22  6:21 PM  Result Value Ref Range   Lactic Acid, Venous 2.4 (HH) 0.5 - 1.9 mmol/L    Comment: CRITICAL RESULT CALLED TO, READ BACK BY AND VERIFIED WITH Lyndle Herrlich, RN @ [phone] 10/06/22 BY [facility] Performed at [facility], [place] [address] [place], [place] [address]   Urinalysis, Routine w reflex microscopic Urine, Clean Catch     Status: Abnormal   Collection Time: 10/06/22  8:35 PM  Result Value Ref Range   Color, Urine AMBER (A) YELLOW    Comment: BIOCHEMICALS MAY BE AFFECTED BY COLOR   APPearance HAZY (A) CLEAR   Specific Gravity, Urine 1.021 1.005 - 1.030   pH 5.0 5.0 - 8.0   Glucose, UA NEGATIVE NEGATIVE mg/dL   Hgb urine dipstick NEGATIVE NEGATIVE   Bilirubin Urine NEGATIVE  NEGATIVE   Ketones, ur NEGATIVE NEGATIVE mg/dL   Protein, ur NEGATIVE NEGATIVE mg/dL   Nitrite NEGATIVE NEGATIVE   Leukocytes,Ua NEGATIVE NEGATIVE    Comment: Performed at [facility], [place] [address] [place], [place] [address]   No results found.  Pending Labs [employer] (From admission,  onward)     Start     Ordered   10/06/22 2140  CBC  (heparin)  Once,   R       Comments: Baseline for heparin therapy IF NOT ALREADY DRAWN.  Notify MD if PLT < 100 K.    10/06/22 2139   10/06/22 2140  Creatinine, serum  (heparin)  Once,   R       Comments: Baseline for heparin therapy IF NOT ALREADY DRAWN.    10/06/22 2139   10/06/22 2026  Ammonia  Once,   STAT        10/06/22 2025   10/06/22 2012  Protime-INR  Once,   STAT        10/06/22 2012   10/06/22 1821  Lactic acid, plasma  Now then every 2 hours,   R      10/06/22 1821            Vitals/Pain Today's Vitals   10/06/22 1807 10/06/22 2043 10/06/22 2300 10/06/22 2301  BP: 105/65 118/79 102/60   Pulse: 89 (!) 103 86   Resp: 16 (!) 22 18   Temp: 98.3 F (36.8 C) 97.9 F (36.6 C)    TempSrc: Oral Oral    SpO2: 100% 100% 99%   Weight: 78.5 kg     PainSc: 9    Asleep    Isolation Precautions No active isolations  Medications Medications  midodrine (PROAMATINE) tablet 15 mg (has no administration in time range)  potassium chloride (KLOR-CON) packet 40 mEq (40 mEq Oral Given 10/06/22 2243)  heparin injection 5,000 Units (5,000 Units Subcutaneous Given 10/06/22 2245)  lactulose (CHRONULAC) 10 GM/15ML solution 30 g (has no administration in time range)  albumin human 25 % solution 75 g (75 g Intravenous New Bag/Given 10/06/22 2246)  HYDROmorphone (DILAUDID) injection 0.5 mg (0.5 mg Intravenous Given 10/06/22 2245)    Mobility walks Moderate fall risk   Focused Assessments Neuro Assessment Handoff:  Swallow screen pass? Yes          Neuro Assessment:   Neuro Checks:      Has TPA been given? No If  patient is a Neuro Trauma and patient is going to OR before floor call report to 4N Charge nurse: [phone] or [phone]   , Pulmonary Assessment Handoff:  Lung sounds:   O2 Device: Room Air      R Recommendations: See Admitting Provider Note  Report given to:   Additional Notes: albumin infusion given through 6 (12.5g) bottles. This is what pulled out of pyxis-will send up with patient

## 2022-10-06 NOTE — H&P (Addendum)
History and Physical  [name]:[id number] DOB: [birth year] DOA: 10/06/2022  Referring physician: Dr. Oswald Hillock, [place]. PCP: Kerin Perna, NP  Outpatient Specialists: Nephrology, GI. Patient coming from: Home.  Chief Complaint: Abnormal labs.  HPI: [name] is a 47 y.o. 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  With associative symmetrical bilateral lower extremity edema.  In the ED, on physical exam, noted to be significantly volume overloaded.  Lab studies revealed AKI with creatinine 3.16 and GFR 18 from creatinine of 2.55 with GFR 23, less than a month ago.  Labs also notable for acute blood loss anemia with hemoglobin 7.7 from 9.6 almost a month ago.  No recent overt bleeding.  States on 09/27/22 she had hematemesis and let outpatient GI know.  Also adds that she was prescribed Rifaximin but could not afford it.  EDP discussed the case with nephrology, who will see in consultation.  Recommended IV albumin 1 g/kg.  ED Course: Tmax 98.3.  BP 98/53, pulse 86, respiratory 19, saturation 100% on room air.  Lab studies remarkable for serum sodium 133, potassium 3.0, serum bicarb 18, glucose 101, BUN 26, creatinine 3.16, calcium 8.6, alkaline phosphatase 131, albumin 2.5, AST 70, ALT 26.  Total bilirubin 2.0.  GFR 18.  Lactic acid 2.4.  Review of Systems: Review of systems as noted in the HPI. All other systems reviewed and are negative.   Past Medical History:  Diagnosis Date   Acid reflux    Alcohol dependence in remission ([place])    Anemia    Renal disorder    Ulcer    Past Surgical History:  Procedure Laterality Date   ABDOMINAL SURGERY     abdominoplasty   BIOPSY  04/18/2022   Procedure: BIOPSY;   Surgeon: Jackquline Denmark, MD;  Location: [facility] ENDOSCOPY;  Service: Gastroenterology;;   BREAST BIOPSY Right 2013   CHOLECYSTECTOMY N/A 11/23/2019   Procedure: LAPAROSCOPIC CHOLECYSTECTOMY WITH INTRAOPERATIVE CHOLANGIOGRAM;  Surgeon: Mickeal Skinner, MD;  Location: [place];  Service: General;  Laterality: N/A;   COSMETIC SURGERY     tummy tuck   ESOPHAGOGASTRODUODENOSCOPY (EGD) WITH PROPOFOL N/A 04/18/2022   Procedure: ESOPHAGOGASTRODUODENOSCOPY (EGD) WITH PROPOFOL;  Surgeon: Jackquline Denmark, MD;  Location: [place];  Service: Gastroenterology;  Laterality: N/A;   FOOT SURGERY     HERNIA REPAIR     I & D EXTREMITY Left 06/21/2022   Procedure: IRRIGATION AND DEBRIDEMENT EXTREMITY Debridement of Left Foot/APPLICATION OF GRAFT;  Surgeon: Yevonne Pax, DPM;  Location: [facility] ORS;  Service: Podiatry;  Laterality: Left;   IR PARACENTESIS  09/14/2022   ROUX-EN-Y PROCEDURE     TUMOR REMOVAL     WRIST GANGLION EXCISION      Social History:  reports that she has been smoking cigarettes. She has a 6.25 pack-year smoking history. She has been exposed to tobacco smoke. She has never used smokeless tobacco. She reports that she does not currently use alcohol. She reports that she does not use drugs.   Allergies  Allergen Reactions   Tape Rash    Clear tape    Family History  Problem Relation Age of Onset   Cancer Mother        kidney   Cancer Paternal Aunt    Breast cancer Maternal Aunt  Pancreatic cancer Maternal Aunt    Lung cancer Paternal Uncle       Prior to Admission medications   Medication Sig Start Date End Date Taking? Authorizing Provider  pantoprazole (PROTONIX) 40 MG tablet Take 1 tablet (40 mg total) by mouth daily. 09/29/22   Lynann Bologna, MD  bisacodyl (DULCOLAX) 5 MG EC tablet Take 1 tablet (5 mg total) by mouth daily as needed for moderate constipation. 09/21/22   Barnetta Chapel, MD  busPIRone (BUSPAR) 15 MG tablet Take 15 mg by mouth daily as needed  (anxiety). 06/09/21   [provider]  Cyanocobalamin (B-12 PO) Take 1 tablet by mouth daily.    [provider]  ferrous sulfate 325 (65 FE) MG tablet Take 1 tablet (325 mg total) by mouth daily with breakfast. 05/16/22   [name], NP  folic acid (FOLVITE) 1 MG tablet Take 1 tablet (1 mg total) by mouth daily. 04/22/22   Danford, Earl Lites, MD  lactulose (CHRONULAC) 10 GM/15ML solution Take 45 mLs (30 g total) by mouth 2 (two) times daily as needed (titrate to 2-3 soft stools per day). 09/21/22   Barnetta Chapel, MD  midodrine (PROAMATINE) 5 MG tablet Take 3 tablets (15 mg total) by mouth 3 (three) times daily with meals. 09/21/22 10/21/22  Berton Mount I, MD  multivitamin (RENA-VIT) TABS tablet Take 1 tablet by mouth at bedtime. 09/21/22 10/21/22  Berton Mount I, MD  nicotine (NICODERM CQ - DOSED IN MG/24 HR) 7 mg/24hr patch Place 1 patch (7 mg total) onto the skin daily. 09/22/22   Barnetta Chapel, MD  Nutritional Supplements (FEEDING SUPPLEMENT, NEPRO CARB STEADY,) LIQD Take 237 mLs by mouth 2 (two) times daily between meals. 09/22/22 10/22/22  Berton Mount I, MD  polyethylene glycol powder (GLYCOLAX/MIRALAX) 17 GM/SCOOP powder Dissolve 17 g in water and take by mouth daily. 09/22/22   Berton Mount I, MD  sertraline (ZOLOFT) 50 MG tablet Take 50 mg by mouth daily.    [provider]    Physical Exam: BP 118/79 (BP Location: Right Arm)   Pulse (!) 103   Temp 97.9 F (36.6 C) (Oral)   Resp (!) 22   Wt 78.5 kg   LMP 03/10/2022 (Approximate)   SpO2 100%   BMI 27.92 kg/m   General: 47 y.o. year-old female well developed well nourished in no acute distress.  Alert and oriented x3. Cardiovascular: Regular rate and rhythm with no rubs or gallops.  No thyromegaly or JVD noted.  4+ pitting edema in lower extremities bilaterally.  Respiratory: Clear to auscultation with no wheezes or rales. Good inspiratory effort. Abdomen:  Distended and diffusely tender with normal bowel sounds x4 quadrants. Muskuloskeletal: No cyanosis or clubbing.  4+ pitting edema in lower extremities bilaterally. Neuro: CN II-XII intact, strength, sensation, reflexes Skin: No ulcerative lesions noted or rashes Psychiatry: Judgement and insight appear normal. Mood is appropriate for condition and setting          Labs on Admission:  Basic Metabolic Panel: Recent Labs  Lab 10/06/22 1821  NA 133*  K 3.0*  CL 106  CO2 18*  GLUCOSE 101*  BUN 26*  CREATININE 3.16*  CALCIUM 8.6*   Liver Function Tests: Recent Labs  Lab 10/06/22 1821  AST 70*  ALT 26  ALKPHOS 131*  BILITOT 2.0*  PROT 7.1  ALBUMIN 2.5*   Recent Labs  Lab 10/06/22 1821  LIPASE 35   No results for input(s): "AMMONIA" in the last  168 hours. CBC: Recent Labs  Lab 10/06/22 1821  WBC 8.1  HGB 7.7*  HCT 23.4*  MCV 106.8*  PLT 66*   Cardiac Enzymes: No results for input(s): "CKTOTAL", "CKMB", "CKMBINDEX", "TROPONINI" in the last 168 hours.  BNP (last 3 results) Recent Labs    04/29/22 0306 06/20/22 2219 09/13/22 2223  BNP 967.2* 82.0 128.2*    ProBNP (last 3 results) No results for input(s): "PROBNP" in the last 8760 hours.  CBG: No results for input(s): "GLUCAP" in the last 168 hours.  Radiological Exams on Admission: No results found.  EKG: I independently viewed the EKG done and my findings are as followed: Sinus rhythm rate of 79.  Nonspecific ST-T changes.  QTc 456.  Assessment/Plan Present on Admission:  AKI (acute kidney injury) ([place])  Principal Problem:   AKI (acute kidney injury) ([place])  AKI on CKD 3B, suspect prerenal in setting of diuretics versus hepatorenal syndrome Baseline creatinine 1.44 with GFR of 44. Presented with creatinine of 3.16 with GFR of 18 Abdomen 1 g/kg at nephrology's recommendation Monitor urine output Avoid nephrotoxic agents and hypotension. Repeat renal function in the morning  Decompensated  alcoholic cirrhosis with ascites Complete abstinence of alcohol Avoid hepatotoxic agents IV albumin, diuretics if BP and renal function can tolerate. INR 1.5 Resume home lactulose Goal bowel movement, at least 3 loose stools per day GI consulted, Dr. Benson Norway via secure chat.  Coagulopathy in the setting of decompensated cirrhosis INR 1.5 Monitor for signs of bleeding  Acute blood loss anemia/macrocytic anemia Hemoglobin 7.7, baseline hemoglobin 9 MCV 106 chronic No recent overt bleeding Transfuse hemoglobin less than 7.0. Closely monitor H&H  Hypokalemia Serum potassium 3.0 Repleted orally. Repeat CMP in the morning  Mild non-anion gap metabolic acidosis Serum bicarb 18, anion gap 9. Continue with IV albumin. Repeat renal function test in the morning.  Thrombocytopenia likely secondary to cirrhosis Platelet count 66 Closely monitor platelet count.  Elevated liver chemistries likely secondary to cirrhosis AST 70, alkaline phosphatase 131, total bilirubin 2.0, albumin 2.5. Avoid hepatotoxic agents.  Chronic anxiety/depression Resume home Zoloft and buspirone  GERD Resume home PPI  Iron deficiency anemia Resume home ferrous sulfate.  Tobacco use disorder Nicotine patch as needed    Critical care time: 65 minutes.    DVT prophylaxis: Subcu heparin 3 times daily  Code Status: Full code  Family Communication: None at bedside  Disposition Plan: Admitted to telemetry medical unit  Consults called: Nephrology consulted by EDP GI, Dr. Benson Norway, consulted yesterday.  Admission status: Inpatient status.   Status is: Inpatient The patient requires at least 2 midnights for further evaluation and treatment of present condition.   Kayleen Memos MD [facility] Pager [phone]  If 7PM-7AM, please contact night-coverage [email or web address] Password [facility]  10/06/2022, 9:16 PM

## 2022-10-06 NOTE — ED Triage Notes (Signed)
Pt sent by nephrologist for evaluation hypokalemia and increased renal insufficieny; endorses abdominal pain and swelling; pt states she needs a paracentesis per her nephrologist

## 2022-10-07 ENCOUNTER — Inpatient Hospital Stay (HOSPITAL_COMMUNITY): Payer: BC Managed Care – PPO

## 2022-10-07 DIAGNOSIS — K767 Hepatorenal syndrome: Secondary | ICD-10-CM

## 2022-10-07 DIAGNOSIS — N179 Acute kidney failure, unspecified: Secondary | ICD-10-CM | POA: Diagnosis not present

## 2022-10-07 LAB — COMPREHENSIVE METABOLIC PANEL
ALT: 21 U/L (ref 0–44)
AST: 50 U/L — ABNORMAL HIGH (ref 15–41)
Albumin: 2.9 g/dL — ABNORMAL LOW (ref 3.5–5.0)
Alkaline Phosphatase: 110 U/L (ref 38–126)
Anion gap: 9 (ref 5–15)
BUN: 27 mg/dL — ABNORMAL HIGH (ref 6–20)
CO2: 20 mmol/L — ABNORMAL LOW (ref 22–32)
Calcium: 8.5 mg/dL — ABNORMAL LOW (ref 8.9–10.3)
Chloride: 107 mmol/L (ref 98–111)
Creatinine, Ser: 3.09 mg/dL — ABNORMAL HIGH (ref 0.44–1.00)
GFR, Estimated: 18 mL/min — ABNORMAL LOW (ref >60)
Glucose, Bld: 102 mg/dL — ABNORMAL HIGH (ref 70–99)
Potassium: 2.7 mmol/L — CL (ref 3.5–5.1)
Sodium: 136 mmol/L (ref 135–145)
Total Bilirubin: 1.6 mg/dL — ABNORMAL HIGH (ref 0.3–1.2)
Total Protein: 6 g/dL — ABNORMAL LOW (ref 6.5–8.1)

## 2022-10-07 LAB — PHOSPHORUS: Phosphorus: 3.5 mg/dL (ref 2.5–4.6)

## 2022-10-07 LAB — BODY FLUID CELL COUNT WITH DIFFERENTIAL
Eos, Fluid: 0 %
Lymphs, Fluid: 29 %
Monocyte-Macrophage-Serous Fluid: 49 % — ABNORMAL LOW (ref 50–90)
Neutrophil Count, Fluid: 22 % (ref 0–25)
Total Nucleated Cell Count, Fluid: 126 cu mm (ref 0–1000)

## 2022-10-07 LAB — BASIC METABOLIC PANEL
Anion gap: 11 (ref 5–15)
BUN: 26 mg/dL — ABNORMAL HIGH (ref 6–20)
CO2: 18 mmol/L — ABNORMAL LOW (ref 22–32)
Calcium: 8.5 mg/dL — ABNORMAL LOW (ref 8.9–10.3)
Chloride: 108 mmol/L (ref 98–111)
Creatinine, Ser: 2.75 mg/dL — ABNORMAL HIGH (ref 0.44–1.00)
GFR, Estimated: 21 mL/min — ABNORMAL LOW (ref >60)
Glucose, Bld: 89 mg/dL (ref 70–99)
Potassium: 4.4 mmol/L (ref 3.5–5.1)
Sodium: 137 mmol/L (ref 135–145)

## 2022-10-07 LAB — ALBUMIN, PLEURAL OR PERITONEAL FLUID: Albumin, Fluid: <1.5 g/dL

## 2022-10-07 LAB — SODIUM, URINE, RANDOM: Sodium, Ur: <10 mmol/L

## 2022-10-07 LAB — CBC
HCT: 17.3 % — ABNORMAL LOW (ref 36.0–46.0)
Hemoglobin: 6.1 g/dL — CL (ref 12.0–15.0)
MCH: 35.9 pg — ABNORMAL HIGH (ref 26.0–34.0)
MCHC: 35.3 g/dL (ref 30.0–36.0)
MCV: 101.8 fL — ABNORMAL HIGH (ref 80.0–100.0)
Platelets: 46 10*3/uL — ABNORMAL LOW (ref 150–400)
RBC: 1.7 MIL/uL — ABNORMAL LOW (ref 3.87–5.11)
RDW: 19.7 % — ABNORMAL HIGH (ref 11.5–15.5)
WBC: 5.7 10*3/uL (ref 4.0–10.5)
nRBC: 0.4 % — ABNORMAL HIGH (ref 0.0–0.2)

## 2022-10-07 LAB — HEMOGLOBIN AND HEMATOCRIT, BLOOD
HCT: 22.6 % — ABNORMAL LOW (ref 36.0–46.0)
Hemoglobin: 7.9 g/dL — ABNORMAL LOW (ref 12.0–15.0)

## 2022-10-07 LAB — GRAM STAIN: Gram Stain: NONE SEEN

## 2022-10-07 LAB — PREPARE RBC (CROSSMATCH)

## 2022-10-07 LAB — MAGNESIUM: Magnesium: 2.4 mg/dL (ref 1.7–2.4)

## 2022-10-07 LAB — CREATININE, URINE, RANDOM: Creatinine, Urine: 161 mg/dL

## 2022-10-07 MED ORDER — LIDOCAINE HCL (PF) 1 % IJ SOLN
INTRAMUSCULAR | Status: AC
  Filled 2022-10-07: qty 30

## 2022-10-07 MED ORDER — ALBUMIN HUMAN 25 % IV SOLN
75.0000 g | Freq: Every day | INTRAVENOUS | Status: DC
  Administered 2022-10-08: 75 g via INTRAVENOUS
  Filled 2022-10-07 (×3): qty 300

## 2022-10-07 MED ORDER — POTASSIUM CHLORIDE CRYS ER 20 MEQ PO TBCR
40.0000 meq | EXTENDED_RELEASE_TABLET | Freq: Once | ORAL | Status: AC
  Administered 2022-10-07: 40 meq via ORAL
  Filled 2022-10-07: qty 2

## 2022-10-07 MED ORDER — POTASSIUM CHLORIDE 10 MEQ/100ML IV SOLN
10.0000 meq | INTRAVENOUS | Status: DC
  Administered 2022-10-07: 10 meq via INTRAVENOUS
  Filled 2022-10-07: qty 100

## 2022-10-07 MED ORDER — SODIUM CHLORIDE 0.9 % IV SOLN: 1.0000 g | INTRAVENOUS | Status: DC

## 2022-10-07 MED ORDER — SODIUM CHLORIDE 0.9 % IV SOLN
2.0000 g | INTRAVENOUS | Status: DC
  Administered 2022-10-07 – 2022-10-09 (×3): 2 g via INTRAVENOUS
  Filled 2022-10-07 (×3): qty 20

## 2022-10-07 MED ORDER — POTASSIUM CHLORIDE CRYS ER 20 MEQ PO TBCR
40.0000 meq | EXTENDED_RELEASE_TABLET | Freq: Once | ORAL | Status: DC
  Filled 2022-10-07: qty 2

## 2022-10-07 MED ORDER — SODIUM CHLORIDE 0.9% IV SOLUTION: Freq: Once | INTRAVENOUS | Status: DC

## 2022-10-07 MED ORDER — LIDOCAINE HCL (PF) 1 % IJ SOLN: 5.0000 mL | Freq: Once | INTRAMUSCULAR | Status: DC

## 2022-10-07 MED ORDER — ACETAMINOPHEN 325 MG PO TABS: 650.0000 mg | ORAL_TABLET | Freq: Four times a day (QID) | ORAL | Status: DC | PRN

## 2022-10-07 MED ORDER — OXYCODONE HCL 5 MG PO TABS
5.0000 mg | ORAL_TABLET | Freq: Four times a day (QID) | ORAL | Status: DC | PRN
  Administered 2022-10-07 – 2022-10-10 (×5): 5 mg via ORAL
  Filled 2022-10-07 (×5): qty 1

## 2022-10-07 MED ORDER — EMPTY CONTAINERS FLEXIBLE MISC
75.0000 g | Freq: Every day | Status: DC
  Filled 2022-10-07: qty 300

## 2022-10-07 NOTE — Progress Notes (Signed)
   10/07/22 0422  Provider Notification  Provider Name/Title Raenette Rover, NP  Date Provider Notified 10/07/22  Time Provider Notified 0422  Method of Notification Page  Notification Reason Critical Result  Test performed and critical result K 2.7  Date Critical Result Received 10/07/22  Time Critical Result Received 0422  Provider response See new orders  Date of Provider Response 10/07/22  Time of Provider Response [phone]

## 2022-10-07 NOTE — H&P (View-Only) (Signed)
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  Her creatinine increased from 2.55 to 3.16 over the past month.  She also reports problems with lower extremity edema.  On 09/27/2022 she reported having some mild hematemesis and her HGB declined from 9.6 down to 7.7 g/dL.  On 08/14/2022 she was evaluated by Dr. Lyndel Safe for IDA and she was positive for bleeding marginal ulcers.  Two hemoclips were placed.  The colonoscopy was aborted secondary to a poor prep.  The patient denies any issues with hematochezia or melena, but her HGB declined to 6.1 from 7.2 g/dL overnight.  She reports a distended abdomen from ascites.  Previously she had 4 liters removed on 09/14/2022 and then 1.55 liters removed on 09/17/2022.  IR made mention of further evaluation for treatment of her portal HTN.  She states that she was counseled about being on a low sodium diet, but she intimated that she was not following it strictly.  Past Medical History:  Diagnosis Date   Acid reflux    Alcohol dependence in remission ([place])    Anemia    Renal disorder    Ulcer     Past Surgical History:  Procedure Laterality Date   ABDOMINAL SURGERY     abdominoplasty   BIOPSY  04/18/2022   Procedure: BIOPSY;  Surgeon: Jackquline Denmark, MD;  Location: [facility] ENDOSCOPY;  Service: Gastroenterology;;   BREAST BIOPSY Right 2013   CHOLECYSTECTOMY N/A 11/23/2019   Procedure: LAPAROSCOPIC CHOLECYSTECTOMY WITH INTRAOPERATIVE CHOLANGIOGRAM;  Surgeon: Mickeal Skinner, MD;  Location: [place];  Service: General;  Laterality: N/A;    COSMETIC SURGERY     tummy tuck   ESOPHAGOGASTRODUODENOSCOPY (EGD) WITH PROPOFOL N/A 04/18/2022   Procedure: ESOPHAGOGASTRODUODENOSCOPY (EGD) WITH PROPOFOL;  Surgeon: Jackquline Denmark, MD;  Location: [place];  Service: Gastroenterology;  Laterality: N/A;   FOOT SURGERY     HERNIA REPAIR     I & D EXTREMITY Left 06/21/2022   Procedure: IRRIGATION AND DEBRIDEMENT EXTREMITY Debridement of Left Foot/APPLICATION OF GRAFT;  Surgeon: Yevonne Pax, DPM;  [facility];  Service: Podiatry;  Laterality: Left;   IR PARACENTESIS  09/14/2022   ROUX-EN-Y PROCEDURE     TUMOR REMOVAL     WRIST GANGLION EXCISION      Family History  Problem Relation Age of Onset   Cancer Mother        kidney   Cancer Paternal Aunt    Breast cancer Maternal Aunt    Pancreatic cancer Maternal Aunt    Lung cancer Paternal Uncle     Social History:  reports that she has been smoking cigarettes. She has a 6.25 pack-year smoking history. She has been exposed to tobacco smoke. She has never used smokeless tobacco. She reports that she does not currently use alcohol. She reports that she does not use drugs.  Allergies:  Allergies  Allergen Reactions   Tape Rash    Clear tape    Medications: Scheduled:  sodium chloride   Intravenous Once   lactulose  30 g Oral BID   midodrine  15 mg Oral TID WC   potassium  chloride  40 mEq Oral Once   Continuous:  Results for orders placed or performed during the hospital encounter of 10/06/22 (from the past 24 hour(s))  Lipase, blood     Status: None   Collection Time: 10/06/22  6:21 PM  Result Value Ref Range   Lipase 35 11 - 51 U/L  Comprehensive metabolic panel     Status: Abnormal   Collection Time: 10/06/22  6:21 PM  Result Value Ref Range   Sodium 133 (L) 135 - 145 mmol/L   Potassium 3.0 (L) 3.5 - 5.1 mmol/L   Chloride 106 98 - 111 mmol/L   CO2 18 (L) 22 - 32 mmol/L   Glucose, Bld 101 (H) 70 - 99 mg/dL   BUN 26 (H) 6 - 20 mg/dL   Creatinine, Ser  2.56 (H) 0.44 - 1.00 mg/dL   Calcium 8.6 (L) 8.9 - 10.3 mg/dL   Total Protein 7.1 6.5 - 8.1 g/dL   Albumin 2.5 (L) 3.5 - 5.0 g/dL   AST 70 (H) 15 - 41 U/L   ALT 26 0 - 44 U/L   Alkaline Phosphatase 131 (H) 38 - 126 U/L   Total Bilirubin 2.0 (H) 0.3 - 1.2 mg/dL   GFR, Estimated 18 (L) >60 mL/min   Anion gap 9 5 - 15  CBC     Status: Abnormal   Collection Time: 10/06/22  6:21 PM  Result Value Ref Range   WBC 8.1 4.0 - 10.5 K/uL   RBC 2.19 (L) 3.87 - 5.11 MIL/uL   Hemoglobin 7.7 (L) 12.0 - 15.0 g/dL   HCT 38.9 (L) 37.3 - 42.8 %   MCV 106.8 (H) 80.0 - 100.0 fL   MCH 35.2 (H) 26.0 - 34.0 pg   MCHC 32.9 30.0 - 36.0 g/dL   RDW 76.8 (H) 11.5 - 72.6 %   Platelets 66 (L) 150 - 400 K/uL   nRBC 0.0 0.0 - 0.2 %  Lactic acid, plasma     Status: Abnormal   Collection Time: 10/06/22  6:21 PM  Result Value Ref Range   Lactic Acid, Venous 2.4 (HH) 0.5 - 1.9 mmol/L  Urinalysis, Routine w reflex microscopic Urine, Clean Catch     Status: Abnormal   Collection Time: 10/06/22  8:35 PM  Result Value Ref Range   Color, Urine AMBER (A) YELLOW   APPearance HAZY (A) CLEAR   Specific Gravity, Urine 1.021 1.005 - 1.030   pH 5.0 5.0 - 8.0   Glucose, UA NEGATIVE NEGATIVE mg/dL   Hgb urine dipstick NEGATIVE NEGATIVE   Bilirubin Urine NEGATIVE NEGATIVE   Ketones, ur NEGATIVE NEGATIVE mg/dL   Protein, ur NEGATIVE NEGATIVE mg/dL   Nitrite NEGATIVE NEGATIVE   Leukocytes,Ua NEGATIVE NEGATIVE  Lactic acid, plasma     Status: Abnormal   Collection Time: 10/06/22 10:50 PM  Result Value Ref Range   Lactic Acid, Venous 2.5 (HH) 0.5 - 1.9 mmol/L  Protime-INR     Status: Abnormal   Collection Time: 10/06/22 10:50 PM  Result Value Ref Range   Prothrombin Time 17.5 (H) 11.4 - 15.2 seconds   INR 1.5 (H) 0.8 - 1.2  Ammonia     Status: Abnormal   Collection Time: 10/06/22 10:50 PM  Result Value Ref Range   Ammonia 89 (H) 9 - 35 umol/L  CBC     Status: Abnormal   Collection Time: 10/06/22 10:50 PM  Result  Value Ref Range   WBC 7.0 4.0 - 10.5 K/uL   RBC  2.06 (L) 3.87 - 5.11 MIL/uL   Hemoglobin 7.2 (L) 12.0 - 15.0 g/dL   HCT 72.5 (L) 36.6 - 44.0 %   MCV 105.3 (H) 80.0 - 100.0 fL   MCH 35.0 (H) 26.0 - 34.0 pg   MCHC 33.2 30.0 - 36.0 g/dL   RDW 34.7 (H) 42.5 - 95.6 %   Platelets 54 (L) 150 - 400 K/uL   nRBC 0.0 0.0 - 0.2 %  Creatinine, serum     Status: Abnormal   Collection Time: 10/06/22 10:50 PM  Result Value Ref Range   Creatinine, Ser 3.19 (H) 0.44 - 1.00 mg/dL   GFR, Estimated 18 (L) >60 mL/min  CBC     Status: Abnormal   Collection Time: 10/07/22  2:39 AM  Result Value Ref Range   WBC 5.7 4.0 - 10.5 K/uL   RBC 1.70 (L) 3.87 - 5.11 MIL/uL   Hemoglobin 6.1 (LL) 12.0 - 15.0 g/dL   HCT 38.7 (L) 56.4 - 33.2 %   MCV 101.8 (H) 80.0 - 100.0 fL   MCH 35.9 (H) 26.0 - 34.0 pg   MCHC 35.3 30.0 - 36.0 g/dL   RDW 95.1 (H) 88.4 - 16.6 %   Platelets 46 (L) 150 - 400 K/uL   nRBC 0.4 (H) 0.0 - 0.2 %  Comprehensive metabolic panel     Status: Abnormal   Collection Time: 10/07/22  2:39 AM  Result Value Ref Range   Sodium 136 135 - 145 mmol/L   Potassium 2.7 (LL) 3.5 - 5.1 mmol/L   Chloride 107 98 - 111 mmol/L   CO2 20 (L) 22 - 32 mmol/L   Glucose, Bld 102 (H) 70 - 99 mg/dL   BUN 27 (H) 6 - 20 mg/dL   Creatinine, Ser 0.63 (H) 0.44 - 1.00 mg/dL   Calcium 8.5 (L) 8.9 - 10.3 mg/dL   Total Protein 6.0 (L) 6.5 - 8.1 g/dL   Albumin 2.9 (L) 3.5 - 5.0 g/dL   AST 50 (H) 15 - 41 U/L   ALT 21 0 - 44 U/L   Alkaline Phosphatase 110 38 - 126 U/L   Total Bilirubin 1.6 (H) 0.3 - 1.2 mg/dL   GFR, Estimated 18 (L) >60 mL/min   Anion gap 9 5 - 15  Magnesium     Status: None   Collection Time: 10/07/22  2:39 AM  Result Value Ref Range   Magnesium 2.4 1.7 - 2.4 mg/dL  Phosphorus     Status: None   Collection Time: 10/07/22  2:39 AM  Result Value Ref Range   Phosphorus 3.5 2.5 - 4.6 mg/dL  Type and screen  [facility]     Status: None (Preliminary result)   Collection Time: 10/07/22   3:50 AM  Result Value Ref Range   ABO/RH(D) B POS    Antibody Screen NEG    Sample Expiration 10/10/2022,2359    Unit Number [id number]    Blood Component Type RBC LR PHER2    Unit division 00    Status of Unit ISSUED    Transfusion Status OK TO TRANSFUSE    Crossmatch Result      Compatible Performed at [facility], [address], [place], [place] [address]   Prepare RBC (crossmatch)     Status: None   Collection Time: 10/07/22  3:59 AM  Result Value Ref Range   Order Confirmation      ORDER PROCESSED BY BLOOD BANK Performed at [facility], [address] [place], [place] [address]      No results found.  ROS:  As stated above in the HPI otherwise negative.  Blood pressure (!) 91/52, pulse 77, temperature 98 F (36.7 C), temperature source Oral, resp. rate 16, height 5\' 6"  (1.676 m), weight 78.6 kg, last menstrual period 03/10/2022, SpO2 100 %.    PE: Gen: NAD, Alert and Oriented HEENT:  [place]/AT, EOMI Neck: Supple, no LAD Lungs: CTA Bilaterally CV: RRR without M/G/R ABD: tense ascites, +BS Ext: No C/C/E  Assessment/Plan: 1) Anemia. 2) History of marginal ulcers. 3) Ascites. 4) Cirrhosis - MELD 3.0 - 26.   The patient's HGB declined.  Further evaluation with an EGD is necessary tomorrow as she has a history of bleeding marginal ulcers.  It is suspected that this is the same situation.  The patient has tense ascites and a paracentesis is scheduled for today.  It may be that she does not have refractory ascites as she was not careful with her sodium intake.  This needs to be monitored before consideration of TIPS.  From the GI standpoint she does not need fluid restriction as this is extremely difficult.  However, if this is from a renal issue, I will defer to renal.  CTX needs to be administered in the setting of a GI bleed and cirrhosis.  Plan: 1) EGD tomorrow. 2) Transfuse. 3) Maintain 2 gram sodium diet. 4) Ceftriaxone. 5)  Paracentesis.  Marea Reasner D 10/07/2022, 9:18 AM

## 2022-10-07 NOTE — Consult Note (Addendum)
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  Her creatinine increased from 2.55 to 3.16 over the past month.  She also reports problems with lower extremity edema.  On 09/27/2022 she reported having some mild hematemesis and her HGB declined from 9.6 down to 7.7 g/dL.  On 08/14/2022 she was evaluated by Dr. Lyndel Safe for IDA and she was positive for bleeding marginal ulcers.  Two hemoclips were placed.  The colonoscopy was aborted secondary to a poor prep.  The patient denies any issues with hematochezia or melena, but her HGB declined to 6.1 from 7.2 g/dL overnight.  She reports a distended abdomen from ascites.  Previously she had 4 liters removed on 09/14/2022 and then 1.55 liters removed on 09/17/2022.  IR made mention of further evaluation for treatment of her portal HTN.  She states that she was counseled about being on a low sodium diet, but she intimated that she was not following it strictly.  Past Medical History:  Diagnosis Date   Acid reflux    Alcohol dependence in remission ([place])    Anemia    Renal disorder    Ulcer     Past Surgical History:  Procedure Laterality Date   ABDOMINAL SURGERY     abdominoplasty   BIOPSY  04/18/2022   Procedure: BIOPSY;  Surgeon: Jackquline Denmark, MD;  Location: [facility] ENDOSCOPY;  Service: Gastroenterology;;   BREAST BIOPSY Right 2013   CHOLECYSTECTOMY N/A 11/23/2019   Procedure: LAPAROSCOPIC CHOLECYSTECTOMY WITH INTRAOPERATIVE CHOLANGIOGRAM;  Surgeon: Mickeal Skinner, MD;  Location: [facility];  Service: General;  Laterality: N/A;    COSMETIC SURGERY     tummy tuck   ESOPHAGOGASTRODUODENOSCOPY (EGD) WITH PROPOFOL N/A 04/18/2022   Procedure: ESOPHAGOGASTRODUODENOSCOPY (EGD) WITH PROPOFOL;  Surgeon: Jackquline Denmark, MD;  Location: [place];  Service: Gastroenterology;  Laterality: N/A;   FOOT SURGERY     HERNIA REPAIR     I & D EXTREMITY Left 06/21/2022   Procedure: IRRIGATION AND DEBRIDEMENT EXTREMITY Debridement of Left Foot/APPLICATION OF GRAFT;  Surgeon: Yevonne Pax, DPM;  Location: [facility];  Service: Podiatry;  Laterality: Left;   IR PARACENTESIS  09/14/2022   ROUX-EN-Y PROCEDURE     TUMOR REMOVAL     WRIST GANGLION EXCISION      Family History  Problem Relation Age of Onset   Cancer Mother        kidney   Cancer Paternal Aunt    Breast cancer Maternal Aunt    Pancreatic cancer Maternal Aunt    Lung cancer Paternal Uncle     Social History:  reports that she has been smoking cigarettes. She has a 6.25 pack-year smoking history. She has been exposed to tobacco smoke. She has never used smokeless tobacco. She reports that she does not currently use alcohol. She reports that she does not use drugs.  Allergies:  Allergies  Allergen Reactions   Tape Rash    Clear tape    Medications: Scheduled:  sodium chloride   Intravenous Once   lactulose  30 g Oral BID   midodrine  15 mg Oral TID WC   potassium  chloride  40 mEq Oral Once   Continuous:  Results for orders placed or performed during the hospital encounter of 10/06/22 (from the past 24 hour(s))  Lipase, blood     Status: None   Collection Time: 10/06/22  6:21 PM  Result Value Ref Range   Lipase 35 11 - 51 U/L  Comprehensive metabolic panel     Status: Abnormal   Collection Time: 10/06/22  6:21 PM  Result Value Ref Range   Sodium 133 (L) 135 - 145 mmol/L   Potassium 3.0 (L) 3.5 - 5.1 mmol/L   Chloride 106 98 - 111 mmol/L   CO2 18 (L) 22 - 32 mmol/L   Glucose, Bld 101 (H) 70 - 99 mg/dL   BUN 26 (H) 6 - 20 mg/dL   Creatinine, Ser  2.56 (H) 0.44 - 1.00 mg/dL   Calcium 8.6 (L) 8.9 - 10.3 mg/dL   Total Protein 7.1 6.5 - 8.1 g/dL   Albumin 2.5 (L) 3.5 - 5.0 g/dL   AST 70 (H) 15 - 41 U/L   ALT 26 0 - 44 U/L   Alkaline Phosphatase 131 (H) 38 - 126 U/L   Total Bilirubin 2.0 (H) 0.3 - 1.2 mg/dL   GFR, Estimated 18 (L) >60 mL/min   Anion gap 9 5 - 15  CBC     Status: Abnormal   Collection Time: 10/06/22  6:21 PM  Result Value Ref Range   WBC 8.1 4.0 - 10.5 K/uL   RBC 2.19 (L) 3.87 - 5.11 MIL/uL   Hemoglobin 7.7 (L) 12.0 - 15.0 g/dL   HCT 38.9 (L) 37.3 - 42.8 %   MCV 106.8 (H) 80.0 - 100.0 fL   MCH 35.2 (H) 26.0 - 34.0 pg   MCHC 32.9 30.0 - 36.0 g/dL   RDW 76.8 (H) 11.5 - 72.6 %   Platelets 66 (L) 150 - 400 K/uL   nRBC 0.0 0.0 - 0.2 %  Lactic acid, plasma     Status: Abnormal   Collection Time: 10/06/22  6:21 PM  Result Value Ref Range   Lactic Acid, Venous 2.4 (HH) 0.5 - 1.9 mmol/L  Urinalysis, Routine w reflex microscopic Urine, Clean Catch     Status: Abnormal   Collection Time: 10/06/22  8:35 PM  Result Value Ref Range   Color, Urine AMBER (A) YELLOW   APPearance HAZY (A) CLEAR   Specific Gravity, Urine 1.021 1.005 - 1.030   pH 5.0 5.0 - 8.0   Glucose, UA NEGATIVE NEGATIVE mg/dL   Hgb urine dipstick NEGATIVE NEGATIVE   Bilirubin Urine NEGATIVE NEGATIVE   Ketones, ur NEGATIVE NEGATIVE mg/dL   Protein, ur NEGATIVE NEGATIVE mg/dL   Nitrite NEGATIVE NEGATIVE   Leukocytes,Ua NEGATIVE NEGATIVE  Lactic acid, plasma     Status: Abnormal   Collection Time: 10/06/22 10:50 PM  Result Value Ref Range   Lactic Acid, Venous 2.5 (HH) 0.5 - 1.9 mmol/L  Protime-INR     Status: Abnormal   Collection Time: 10/06/22 10:50 PM  Result Value Ref Range   Prothrombin Time 17.5 (H) 11.4 - 15.2 seconds   INR 1.5 (H) 0.8 - 1.2  Ammonia     Status: Abnormal   Collection Time: 10/06/22 10:50 PM  Result Value Ref Range   Ammonia 89 (H) 9 - 35 umol/L  CBC     Status: Abnormal   Collection Time: 10/06/22 10:50 PM  Result  Value Ref Range   WBC 7.0 4.0 - 10.5 K/uL   RBC  2.06 (L) 3.87 - 5.11 MIL/uL   Hemoglobin 7.2 (L) 12.0 - 15.0 g/dL   HCT 55.7 (L) 32.2 - 02.5 %   MCV 105.3 (H) 80.0 - 100.0 fL   MCH 35.0 (H) 26.0 - 34.0 pg   MCHC 33.2 30.0 - 36.0 g/dL   RDW 42.7 (H) 06.2 - 37.6 %   Platelets 54 (L) 150 - 400 K/uL   nRBC 0.0 0.0 - 0.2 %  Creatinine, serum     Status: Abnormal   Collection Time: 10/06/22 10:50 PM  Result Value Ref Range   Creatinine, Ser 3.19 (H) 0.44 - 1.00 mg/dL   GFR, Estimated 18 (L) >60 mL/min  CBC     Status: Abnormal   Collection Time: 10/07/22  2:39 AM  Result Value Ref Range   WBC 5.7 4.0 - 10.5 K/uL   RBC 1.70 (L) 3.87 - 5.11 MIL/uL   Hemoglobin 6.1 (LL) 12.0 - 15.0 g/dL   HCT 28.3 (L) 15.1 - 76.1 %   MCV 101.8 (H) 80.0 - 100.0 fL   MCH 35.9 (H) 26.0 - 34.0 pg   MCHC 35.3 30.0 - 36.0 g/dL   RDW 60.7 (H) 37.1 - 06.2 %   Platelets 46 (L) 150 - 400 K/uL   nRBC 0.4 (H) 0.0 - 0.2 %  Comprehensive metabolic panel     Status: Abnormal   Collection Time: 10/07/22  2:39 AM  Result Value Ref Range   Sodium 136 135 - 145 mmol/L   Potassium 2.7 (LL) 3.5 - 5.1 mmol/L   Chloride 107 98 - 111 mmol/L   CO2 20 (L) 22 - 32 mmol/L   Glucose, Bld 102 (H) 70 - 99 mg/dL   BUN 27 (H) 6 - 20 mg/dL   Creatinine, Ser 6.94 (H) 0.44 - 1.00 mg/dL   Calcium 8.5 (L) 8.9 - 10.3 mg/dL   Total Protein 6.0 (L) 6.5 - 8.1 g/dL   Albumin 2.9 (L) 3.5 - 5.0 g/dL   AST 50 (H) 15 - 41 U/L   ALT 21 0 - 44 U/L   Alkaline Phosphatase 110 38 - 126 U/L   Total Bilirubin 1.6 (H) 0.3 - 1.2 mg/dL   GFR, Estimated 18 (L) >60 mL/min   Anion gap 9 5 - 15  Magnesium     Status: None   Collection Time: 10/07/22  2:39 AM  Result Value Ref Range   Magnesium 2.4 1.7 - 2.4 mg/dL  Phosphorus     Status: None   Collection Time: 10/07/22  2:39 AM  Result Value Ref Range   Phosphorus 3.5 2.5 - 4.6 mg/dL  Type and screen [facility]     Status: None (Preliminary result)   Collection Time: 10/07/22   3:50 AM  Result Value Ref Range   ABO/RH(D) B POS    Antibody Screen NEG    Sample Expiration 10/10/2022,2359    Unit Number [id number]    Blood Component Type RBC LR PHER2    Unit division 00    Status of Unit ISSUED    Transfusion Status OK TO TRANSFUSE    Crossmatch Result      Compatible Performed at [facility], [address] [place], [place] [address]   Prepare RBC (crossmatch)     Status: None   Collection Time: 10/07/22  3:59 AM  Result Value Ref Range   Order Confirmation      ORDER PROCESSED BY BLOOD BANK Performed at [facility], [address] [place], [place] [address]      No results found.  ROS:  As stated above in the HPI otherwise negative.  Blood pressure (!) 91/52, pulse 77, temperature 98 F (36.7 C), temperature source Oral, resp. rate 16, height 5\' 6"  (1.676 m), weight 78.6 kg, last menstrual period 03/10/2022, SpO2 100 %.    PE: Gen: NAD, Alert and Oriented HEENT:  [place]/AT, EOMI Neck: Supple, no LAD Lungs: CTA Bilaterally CV: RRR without M/G/R ABD: tense ascites, +BS Ext: No C/C/E  Assessment/Plan: 1) Anemia. 2) History of marginal ulcers. 3) Ascites. 4) Cirrhosis - MELD 3.0 - 26.   The patient's HGB declined.  Further evaluation with an EGD is necessary tomorrow as she has a history of bleeding marginal ulcers.  It is suspected that this is the same situation.  The patient has tense ascites and a paracentesis is scheduled for today.  It may be that she does not have refractory ascites as she was not careful with her sodium intake.  This needs to be monitored before consideration of TIPS.  From the GI standpoint she does not need fluid restriction as this is extremely difficult.  However, if this is from a renal issue, I will defer to renal.  CTX needs to be administered in the setting of a GI bleed and cirrhosis.  Plan: 1) EGD tomorrow. 2) Transfuse. 3) Maintain 2 gram sodium diet. 4) Ceftriaxone. 5)  Paracentesis.  Verbie Babic D 10/07/2022, 9:18 AM

## 2022-10-07 NOTE — Progress Notes (Addendum)
PROGRESS NOTE    [name]  VOH:[id number] DOB: 23-May-1976 DOA: 10/06/2022 PCP: Kerin Perna, NP   Brief Narrative:  47 y.o. 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  On presentation, creatinine was 3.16 (creatinine 2.55 less than a month ago) with hemoglobin of 7.7 (9.6 almost a month ago).  ED provider discussed with nephrology who recommended admission and IV albumin.  GI was also consulted.  Assessment & Plan:   AKI on CKD stage IIIb Acute metabolic acidosis -Probably prerenal in the setting of diuretics versus hepatorenal syndrome -Presented with creatinine of 3.16 (creatinine 2.55 less than a month ago)  -Received IV albumin on presentation.  Creatinine 3.09 today.  Bicarbonate 20 today.  Nephrology evaluation pending.  Follow recommendations. -Strict input and output.  Daily weights.  Monitor renal function.  Decompensated alcoholic cirrhosis with ascites with elevated LFTs and thrombocytopenia with hepatic encephalopathy -Patient is currently completely abstinent of alcohol -GI evaluation pending.  Continue lactulose, ammonia 89 on presentation.  Patient apparently could not afford rifaximin recently as an outpatient -Diuretics on hold -Platelets 46. -Monitor LFTs -Patient has had recent recurrent paracentesis.  Apparently not a candidate for TIPS as per recent IR evaluation as an outpatient.  Abdomen is distended.  Will request ultrasound-guided paracentesis.  Coagulopathy in the setting of decompensated cirrhosis -INR 1.5.  Monitor signs of bleeding  Chronic hypertension -Continue midodrine.  Blood pressure on the lower side.  Acute on chronic anemia of  chronic disease/macrocytic anemia -No overt signs of bleeding currently. -Hemoglobin 7.7 on presentation.  Hemoglobin 6.1 this morning.  Transfusing 1 unit packed red cells.  Monitor H&H.  DC subcutaneous heparin -Follow GI recommendations.  Hypokalemia -Replace.  Repeat a.m. labs  Chronic anxiety and depression -Resume home medications if mental status remains stable  Iron deficiency anemia -Resume ferrous sulfate  GERD -Resume PPI  DVT prophylaxis: SCDs.  DC subcutaneous heparin Code Status: Full Family Communication: None at bedside Disposition Plan: Status is: Inpatient Remains inpatient appropriate because: Of severity of illness  Consultants: GI/nephrology  Procedures: None  Antimicrobials: None   Subjective: Patient seen and examined at bedside.  Complains of abdominal distention and some pain.  No fever, chest pain, worsening shortness of breath reported.  Objective: Vitals:   10/07/22 0633 10/07/22 0833 10/07/22 0846 10/07/22 0849  BP: (!) 96/57 (!) 98/51 (!) 91/52 (!) 91/52  Pulse: 77 77 77 77  Resp: 20 15 16 16   Temp: 97.7 F (36.5 C) 98.6 F (37 C) 98 F (36.7 C) 98 F (36.7 C)  TempSrc: Oral Oral Oral Oral  SpO2: 100% 97% 100% 100%  Weight:      Height:        Intake/Output Summary (Last 24 hours) at 10/07/2022 1017 Last data filed at 10/07/2022 0849 Gross per 24 hour  Intake 542 ml  Output 400 ml  Net 142 ml   Filed Weights   10/06/22 1807 10/07/22 0117  Weight: 78.5 kg 78.6 kg    Examination:  General exam: Appears calm and comfortable.  On room air.  Looks chronically ill and deconditioned. Respiratory system: Bilateral decreased breath sounds at bases with scattered crackles Cardiovascular system: S1 & S2 heard, Rate controlled Gastrointestinal system: Abdomen is distended, soft and mildly tender.  Normal bowel sounds heard. Extremities: No cyanosis, clubbing; bilateral lower extremity edema present Central nervous system: Alert and  oriented.  Slow to respond.  No focal neurological deficits. Moving extremities Skin: No rashes, lesions or ulcers Psychiatry: Flat affect.  Not agitated.    Data Reviewed: I have personally reviewed following labs and imaging studies  CBC: Recent Labs  Lab 10/06/22 1821 10/06/22 2250 10/07/22 0239  WBC 8.1 7.0 5.7  HGB 7.7* 7.2* 6.1*  HCT 23.4* 21.7* 17.3*  MCV 106.8* 105.3* 101.8*  PLT 66* 54* 46*   Basic Metabolic Panel: Recent Labs  Lab 10/06/22 1821 10/06/22 2250 10/07/22 0239  NA 133*  --  136  K 3.0*  --  2.7*  CL 106  --  107  CO2 18*  --  20*  GLUCOSE 101*  --  102*  BUN 26*  --  27*  CREATININE 3.16* 3.19* 3.09*  CALCIUM 8.6*  --  8.5*  MG  --   --  2.4  PHOS  --   --  3.5   GFR: Estimated Creatinine Clearance: 24.3 mL/min (A) (by C-G formula based on SCr of 3.09 mg/dL (H)). Liver Function Tests: Recent Labs  Lab 10/06/22 1821 10/07/22 0239  AST 70* 50*  ALT 26 21  ALKPHOS 131* 110  BILITOT 2.0* 1.6*  PROT 7.1 6.0*  ALBUMIN 2.5* 2.9*   Recent Labs  Lab 10/06/22 1821  LIPASE 35   Recent Labs  Lab 10/06/22 2250  AMMONIA 89*   Coagulation Profile: Recent Labs  Lab 10/06/22 2250  INR 1.5*   Cardiac Enzymes: No results for input(s): "CKTOTAL", "CKMB", "CKMBINDEX", "TROPONINI" in the last 168 hours. BNP (last 3 results) No results for input(s): "PROBNP" in the last 8760 hours. HbA1C: No results for input(s): "HGBA1C" in the last 72 hours. CBG: No results for input(s): "GLUCAP" in the last 168 hours. Lipid Profile: No results for input(s): "CHOL", "HDL", "LDLCALC", "TRIG", "CHOLHDL", "LDLDIRECT" in the last 72 hours. Thyroid Function Tests: No results for input(s): "TSH", "T4TOTAL", "FREET4", "T3FREE", "THYROIDAB" in the last 72 hours. Anemia Panel: No results for input(s): "VITAMINB12", "FOLATE", "FERRITIN", "TIBC", "IRON", "RETICCTPCT" in the last 72 hours. Sepsis Labs: Recent Labs  Lab 10/06/22 1821 10/06/22 2250   LATICACIDVEN 2.4* 2.5*    No results found for this or any previous visit (from the past 240 hour(s)).       Radiology Studies: No results found.      Scheduled Meds:  sodium chloride   Intravenous Once   heparin  5,000 Units Subcutaneous Q8H   lactulose  30 g Oral BID   midodrine  15 mg Oral TID WC   potassium chloride  40 mEq Oral Once   Continuous Infusions:        Aline August, MD [facility] 10/07/2022, 10:17 AM

## 2022-10-07 NOTE — Consult Note (Addendum)
Renal Service Consult Note [facility]  [name] 10/07/2022 Sol Blazing, MD Requesting Physician: Dr Starla Link  Reason for Consult: Renal failure, cirrhosis HPI: The patient is a 47 y.o. year-old w/ PMH as below who presented yesterday w/ c/o abd pain and swelling, in need of paracentesis. In ED K+ low and creat high at 3.1 (recent b/l 2.4- 2.7). Also c/o leg swelling. Also Hb low in 7s. Pt was admitted w/ AKI on CKD and was started on IV albumin 1g/kg. Midodrine at 15 mg tid was continued for soft BP's. Today creat is 3.0.  We are asked to see for renal failure.   Pt seen in room.  States no SOB or cough. Legs swollen. Going for EGD today. She states she is f/b Dr Candiss Norse at [facility]. She is going to [facility] in [place] for liver transplant. She has several days to be there next week and they told her she should get a transplant in early February most likely.   ROS - denies CP, no joint pain, no HA, no blurry vision, no rash, no diarrhea, no nausea/ vomiting, no dysuria, no difficulty voiding   Past Medical History  Past Medical History:  Diagnosis Date   Acid reflux    Alcohol dependence in remission ([place])    Anemia    Renal disorder    Ulcer    Past Surgical History  Past Surgical History:  Procedure Laterality Date   ABDOMINAL SURGERY     abdominoplasty   BIOPSY  04/18/2022   Procedure: BIOPSY;  Surgeon: Jackquline Denmark, MD;  Location: [facility] ENDOSCOPY;  Service: Gastroenterology;;   BREAST BIOPSY Right 2013   CHOLECYSTECTOMY N/A 11/23/2019   Procedure: LAPAROSCOPIC CHOLECYSTECTOMY WITH INTRAOPERATIVE CHOLANGIOGRAM;  Surgeon: Mickeal Skinner, MD;  Location: [place];  Service: General;  Laterality: N/A;   COSMETIC SURGERY     tummy tuck   ESOPHAGOGASTRODUODENOSCOPY (EGD) WITH PROPOFOL N/A 04/18/2022   Procedure: ESOPHAGOGASTRODUODENOSCOPY (EGD) WITH PROPOFOL;  Surgeon: Jackquline Denmark, MD;  Location: [facility];  Service: Gastroenterology;  Laterality: N/A;    FOOT SURGERY     HERNIA REPAIR     I & D EXTREMITY Left 06/21/2022   Procedure: IRRIGATION AND DEBRIDEMENT EXTREMITY Debridement of Left Foot/APPLICATION OF GRAFT;  Surgeon: Yevonne Pax, DPM;  Location: [facility];  Service: Podiatry;  Laterality: Left;   IR PARACENTESIS  09/14/2022   ROUX-EN-Y PROCEDURE     TUMOR REMOVAL     WRIST GANGLION EXCISION     Family History  Family History  Problem Relation Age of Onset   Cancer Mother        kidney   Cancer Paternal Aunt    Breast cancer Maternal Aunt    Pancreatic cancer Maternal Aunt    Lung cancer Paternal Uncle    Social History  reports that she has been smoking cigarettes. She has a 6.25 pack-year smoking history. She has been exposed to tobacco smoke. She has never used smokeless tobacco. She reports that she does not currently use alcohol. She reports that she does not use drugs. Allergies  Allergies  Allergen Reactions   Tape Rash    Clear tape   Home medications Prior to Admission medications   Medication Sig Start Date End Date Taking? Authorizing Provider  folic acid (FOLVITE) 1 MG tablet Take 1 tablet (1 mg total) by mouth daily. 04/22/22  Yes Danford, Suann Larry, MD  lactulose (CHRONULAC) 10 GM/15ML solution Take 45 mLs (30 g total) by mouth  2 (two) times daily as needed (titrate to 2-3 soft stools per day). Patient taking differently: Take 30 g by mouth 2 (two) times daily as needed for mild constipation. 09/21/22  Yes Dana Allan I, MD  midodrine (PROAMATINE) 5 MG tablet Take 3 tablets (15 mg total) by mouth 3 (three) times daily with meals. 09/21/22 10/21/22 Yes Dana Allan I, MD  multivitamin (RENA-VIT) TABS tablet Take 1 tablet by mouth at bedtime. 09/21/22 10/21/22 Yes Bonnell Public, MD  Nutritional Supplements (FEEDING SUPPLEMENT, NEPRO CARB STEADY,) LIQD Take 237 mLs by mouth 2 (two) times daily between meals. 09/22/22 10/22/22 Yes Dana Allan I, MD  pantoprazole (PROTONIX) 40 MG  tablet Take 1 tablet (40 mg total) by mouth daily. Patient taking differently: Take 40 mg by mouth 2 (two) times daily. 09/29/22  Yes Jackquline Denmark, MD  nicotine (NICODERM CQ - DOSED IN MG/24 HR) 7 mg/24hr patch Place 1 patch (7 mg total) onto the skin daily. Patient not taking: Reported on 10/06/2022 09/22/22   Dana Allan I, MD     Vitals:   10/07/22 1148 10/07/22 1152 10/07/22 1155 10/07/22 1219  BP: (!) 99/55 (!) 88/53 (!) 76/38 (!) 90/53  Pulse:    80  Resp:      Temp:      TempSrc:      SpO2:    96%  Weight:      Height:       Exam Gen alert, no distress No rash, cyanosis or gangrene Sclera anicteric, throat clear  No jvd or bruits Chest clear bilat to bases, no rales/ wheezing RRR no MRG Abd soft ntnd no mass or ascites +bs GU defer MS no joint effusions or deformity Ext 1-2+ bilat pretib pitting edema, no hip edema Neuro is alert, Ox 3 , nf    Home meds include - midodrine 15 tid, protonix, nepro, nicotine patch, lactulose 30 g bid, prns/ vits/ supps     Date   Creat  eGFR    2011- 2019  0.54- 0.76    2021   0.68- 1.47    Oct 2022  0.74    7/16- 05/03/22 6.38 >> 1.87     05/16/22  1.25    9/26- 9/29  1.35- 1.79 35- 50 ml/min    10/18  1.33    10/30  1.44  44 ml/min     12/20- 09/21/22 3.64 >> 2.55 15- 23 ml/min      10/06/22  3.16  18 ml/min     1/13   3.09  18 ml/min         Renal US - pending    UA - negative on 1/12    UNa, UCr pending      Assessment/ Plan: AKI on CKD 3b - b/l creat 1.4- 1.8 from sept- oct 2023. Creat 3.1 on admit here now, and had recent admit in Dec w/ creatinine 3.6 >> 2.5.  UA negative, renal US and urine lytes pending. 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 UOP is marginal and BP's are chronically low, she is getting midodrine here as at home. IV albumin started x 3d if still here. These are appropriate treatments. If she progresses she would be a dialysis candidate for bridging given that she is expected to get a liver transplant. Will  try to confirm this w/ her renal MD. No new suggestions. Will follow.  Cirrhosis - decompensated cirrhosis, on the transplant list.  Hypotension - taking midodrine here as at home 15 mg  tid. BP's are soft consistent w/ cirrhosis physiology.  Anemia - hb 7x, transfuse prn       Vinson Moselle  MD 10/07/2022, 2:58 PM Recent Labs  Lab 10/06/22 1821 10/06/22 2250 10/07/22 0239  HGB 7.7* 7.2* 6.1*  ALBUMIN 2.5*  --  2.9*  CALCIUM 8.6*  --  8.5*  PHOS  --   --  3.5  CREATININE 3.16* 3.19* 3.09*  K 3.0*  --  2.7*   Inpatient medications:  sodium chloride   Intravenous Once   lactulose  30 g Oral BID   lidocaine (PF)  5 mL Intradermal Once   midodrine  15 mg Oral TID WC   potassium chloride  40 mEq Oral Once    cefTRIAXone (ROCEPHIN)  IV 2 g (10/07/22 1357)   acetaminophen, oxyCODONE

## 2022-10-07 NOTE — Progress Notes (Signed)
   10/07/22 3845  Provider Notification  Provider Name/Title Raenette Rover, NP  Date Provider Notified 10/07/22  Time Provider Notified [phone]  Method of Notification Page  Notification Reason Critical Result  Test performed and critical result Hgb 6.1  Date Critical Result Received 10/07/22  Time Critical Result Received 0332  Provider response See new orders  Date of Provider Response 10/07/22  Time of Provider Response [id number]

## 2022-10-07 NOTE — Plan of Care (Signed)
  Problem: Education: Goal: Knowledge of General Education information will improve Description: Including pain rating scale, medication(s)/side effects and non-pharmacologic comfort measures 10/07/2022 0320 by Beverely Low, RN Outcome: Progressing 10/07/2022 0224 by Beverely Low, RN Outcome: Progressing   Problem: Health Behavior/Discharge Planning: Goal: Ability to manage health-related needs will improve 10/07/2022 0320 by Beverely Low, RN Outcome: Progressing 10/07/2022 0224 by Beverely Low, RN Outcome: Progressing   Problem: Nutrition: Goal: Adequate nutrition will be maintained 10/07/2022 0320 by Beverely Low, RN Outcome: Progressing 10/07/2022 0224 by Beverely Low, RN Outcome: Progressing

## 2022-10-07 NOTE — Progress Notes (Signed)
Patient could not tolerate IV potassium at 43ml/hr. On call provider notified. Patient complains of pain and burning at IV site. Warm compress applied

## 2022-10-08 ENCOUNTER — Inpatient Hospital Stay (HOSPITAL_COMMUNITY): Payer: BC Managed Care – PPO | Admitting: Certified Registered"

## 2022-10-08 ENCOUNTER — Encounter (HOSPITAL_COMMUNITY): Payer: Self-pay | Admitting: Internal Medicine

## 2022-10-08 ENCOUNTER — Encounter (HOSPITAL_COMMUNITY)
Admission: EM | Disposition: A | Discharge status: Home / Self Care | Payer: Self-pay | Source: Home / Self Care | Attending: Internal Medicine

## 2022-10-08 DIAGNOSIS — N179 Acute kidney failure, unspecified: Secondary | ICD-10-CM | POA: Diagnosis not present

## 2022-10-08 HISTORY — PX: ESOPHAGOGASTRODUODENOSCOPY (EGD) WITH PROPOFOL: SHX5813

## 2022-10-08 HISTORY — PX: HOT HEMOSTASIS: SHX5433

## 2022-10-08 LAB — BPAM RBC
Blood Product Expiration Date: 202401312359
ISSUE DATE / TIME: 202401130611
Unit Type and Rh: 7300

## 2022-10-08 LAB — CBC WITH DIFFERENTIAL/PLATELET
Abs Immature Granulocytes: 0.05 10*3/uL (ref 0.00–0.07)
Basophils Absolute: 0.1 10*3/uL (ref 0.0–0.1)
Basophils Relative: 1 %
Eosinophils Absolute: 0.1 10*3/uL (ref 0.0–0.5)
Eosinophils Relative: 1 %
HCT: 27 % — ABNORMAL LOW (ref 36.0–46.0)
Hemoglobin: 8.8 g/dL — ABNORMAL LOW (ref 12.0–15.0)
Immature Granulocytes: 1 %
Lymphocytes Relative: 25 %
Lymphs Abs: 2.1 10*3/uL (ref 0.7–4.0)
MCH: 34.2 pg — ABNORMAL HIGH (ref 26.0–34.0)
MCHC: 32.6 g/dL (ref 30.0–36.0)
MCV: 105.1 fL — ABNORMAL HIGH (ref 80.0–100.0)
Monocytes Absolute: 0.8 10*3/uL (ref 0.1–1.0)
Monocytes Relative: 10 %
Neutro Abs: 5.4 10*3/uL (ref 1.7–7.7)
Neutrophils Relative %: 62 %
Platelets: 61 10*3/uL — ABNORMAL LOW (ref 150–400)
RBC: 2.57 MIL/uL — ABNORMAL LOW (ref 3.87–5.11)
RDW: 22.4 % — ABNORMAL HIGH (ref 11.5–15.5)
WBC: 8.5 10*3/uL (ref 4.0–10.5)
nRBC: 0 % (ref 0.0–0.2)

## 2022-10-08 LAB — COMPREHENSIVE METABOLIC PANEL
ALT: 25 U/L (ref 0–44)
AST: 63 U/L — ABNORMAL HIGH (ref 15–41)
Albumin: 2.8 g/dL — ABNORMAL LOW (ref 3.5–5.0)
Alkaline Phosphatase: 118 U/L (ref 38–126)
Anion gap: 10 (ref 5–15)
BUN: 25 mg/dL — ABNORMAL HIGH (ref 6–20)
CO2: 16 mmol/L — ABNORMAL LOW (ref 22–32)
Calcium: 8.6 mg/dL — ABNORMAL LOW (ref 8.9–10.3)
Chloride: 113 mmol/L — ABNORMAL HIGH (ref 98–111)
Creatinine, Ser: 2.64 mg/dL — ABNORMAL HIGH (ref 0.44–1.00)
GFR, Estimated: 22 mL/min — ABNORMAL LOW (ref >60)
Glucose, Bld: 81 mg/dL (ref 70–99)
Potassium: 5.1 mmol/L (ref 3.5–5.1)
Sodium: 139 mmol/L (ref 135–145)
Total Bilirubin: 2.3 mg/dL — ABNORMAL HIGH (ref 0.3–1.2)
Total Protein: 6.4 g/dL — ABNORMAL LOW (ref 6.5–8.1)

## 2022-10-08 LAB — MAGNESIUM: Magnesium: 2.3 mg/dL (ref 1.7–2.4)

## 2022-10-08 LAB — TYPE AND SCREEN
ABO/RH(D): B POS
Antibody Screen: NEGATIVE
Unit division: 0

## 2022-10-08 SURGERY — ESOPHAGOGASTRODUODENOSCOPY (EGD) WITH PROPOFOL
Anesthesia: Monitor Anesthesia Care

## 2022-10-08 MED ORDER — LIDOCAINE 2% (20 MG/ML) 5 ML SYRINGE
INTRAMUSCULAR | Status: DC | PRN
  Administered 2022-10-08: 40 mg via INTRAVENOUS

## 2022-10-08 MED ORDER — SODIUM CHLORIDE 0.9 % IV SOLN: INTRAVENOUS | Status: DC | PRN

## 2022-10-08 MED ORDER — OXYCODONE HCL 5 MG/5ML PO SOLN: 5.0000 mg | Freq: Once | ORAL | Status: DC | PRN

## 2022-10-08 MED ORDER — TERLIPRESSIN ACETATE 0.85 MG IV SOLR
0.8500 mg | Freq: Four times a day (QID) | INTRAVENOUS | Status: DC
  Filled 2022-10-08 (×2): qty 1

## 2022-10-08 MED ORDER — PROPOFOL 10 MG/ML IV BOLUS
INTRAVENOUS | Status: DC | PRN
  Administered 2022-10-08 (×4): 20 mg via INTRAVENOUS

## 2022-10-08 MED ORDER — ONDANSETRON HCL 4 MG/2ML IJ SOLN: 4.0000 mg | Freq: Four times a day (QID) | INTRAMUSCULAR | Status: DC | PRN

## 2022-10-08 MED ORDER — TERLIPRESSIN ACETATE 0.85 MG IV SOLR: 0.8500 mg | Freq: Four times a day (QID) | INTRAVENOUS | Status: DC

## 2022-10-08 MED ORDER — SODIUM CHLORIDE 0.9 % IV SOLN: INTRAVENOUS | Status: DC

## 2022-10-08 MED ORDER — PHENYLEPHRINE 80 MCG/ML (10ML) SYRINGE FOR IV PUSH (FOR BLOOD PRESSURE SUPPORT)
PREFILLED_SYRINGE | INTRAVENOUS | Status: DC | PRN
  Administered 2022-10-08 (×3): 80 ug via INTRAVENOUS

## 2022-10-08 MED ORDER — OXYCODONE HCL 5 MG PO TABS: 5.0000 mg | ORAL_TABLET | Freq: Once | ORAL | Status: DC | PRN

## 2022-10-08 MED ORDER — PROPOFOL 500 MG/50ML IV EMUL
INTRAVENOUS | Status: DC | PRN
  Administered 2022-10-08: 100 ug/kg/min via INTRAVENOUS

## 2022-10-08 MED ORDER — PANTOPRAZOLE SODIUM 40 MG IV SOLR
40.0000 mg | Freq: Two times a day (BID) | INTRAVENOUS | Status: DC
  Administered 2022-10-08 – 2022-10-09 (×4): 40 mg via INTRAVENOUS
  Filled 2022-10-08 (×4): qty 10

## 2022-10-08 MED ORDER — MIDODRINE HCL 5 MG PO TABS
20.0000 mg | ORAL_TABLET | Freq: Three times a day (TID) | ORAL | Status: DC
  Administered 2022-10-08 – 2022-10-10 (×6): 20 mg via ORAL
  Filled 2022-10-08 (×7): qty 4

## 2022-10-08 MED ORDER — OCTREOTIDE ACETATE 100 MCG/ML IJ SOLN
150.0000 ug | Freq: Two times a day (BID) | INTRAMUSCULAR | Status: DC
  Administered 2022-10-08 – 2022-10-09 (×3): 150 ug via SUBCUTANEOUS
  Filled 2022-10-08 (×5): qty 1.5

## 2022-10-08 MED ORDER — FENTANYL CITRATE (PF) 100 MCG/2ML IJ SOLN: 25.0000 ug | INTRAMUSCULAR | Status: DC | PRN

## 2022-10-08 SURGICAL SUPPLY — 15 items

## 2022-10-08 NOTE — Interval H&P Note (Signed)
History and Physical Interval Note:  10/08/2022 11:34 AM  [name]  has presented today for surgery, with the diagnosis of GI bleed.  The various methods of treatment have been discussed with the patient and family. After consideration of risks, benefits and other options for treatment, the patient has consented to  Procedure(s): ESOPHAGOGASTRODUODENOSCOPY (EGD) WITH PROPOFOL (N/A) as a surgical intervention.  The patient's history has been reviewed, patient examined, no change in status, stable for surgery.  I have reviewed the patient's chart and labs.  Questions were answered to the patient's satisfaction.     [name]

## 2022-10-08 NOTE — Progress Notes (Addendum)
[facility] Progress Note  Subjective: BP's remain soft in the 90s - low 100s. MAP's of 62- 34.   Vitals:   10/07/22 2107 10/08/22 0010 10/08/22 0500 10/08/22 0514  BP: (!) 97/50 (!) 101/43  (!) 94/47  Pulse: 97 98  94  Resp: 18 17  15   Temp: 99 F (37.2 C) 98.9 F (37.2 C)  98.4 F (36.9 C)  TempSrc: Oral Oral  Oral  SpO2: 100% 100%  98%  Weight:   78.9 kg   Height:        Exam: Gen alert, no distress No jvd or bruits Chest clear bilat to bases RRR no MRG Abd soft ntnd no mass or ascites +bs Ext 1-2+ bilat pretib pitting edema Neuro is alert, Ox 3 , nf      Home meds include - midodrine 15 tid, protonix, nepro, nicotine patch, lactulose 30 g bid, prns/ vits/ supps      Date                          Creat               eGFR    2011- 2019               0.54- 0.76    2021                         0.68- 1.47    Oct 2022                  0.74    7/16- 05/03/22            6.38 >> 1.87        05/16/22                     1.25    9/26- 9/29                 1.35- 1.79        35- 50 ml/min    10/18                        1.33    10/30                        1.44                 44 ml/min            12/20- 09/21/22        3.64 >> 2.55    15- 23 ml/min      10/06/22                    3.16                 18 ml/min     1/13                         3.09                 18 ml/min      1/14    2.64         Renal US - pending    UA - negative on 1/12    UNa < 10, UCr 160       Assessment/  Plan: AKI on CKD 3b - b/l creat 1.4- 1.8 from sept- oct 2023. Creat 3.1 on admit here now, and had recent admit in Dec w/ creatinine 3.6 >> 2.5.  UA negative, renal US and urine lytes c/w prerenal physiology. Creat is improving but remains significantly above her baseline. 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 UOP remains marginal and BP's remain low despite midodrine 15 tid and IV albumin. Was going to use IV terlipressin ( a vasopressor newly approved for Rx of HRS) but per pharmacy will not be  available until Tuesday. Will add SQ octreotide and ^midodrine to 20 tid, hopefully creatinine will continue to improve. May need pressor support soon if renal function +/- BP's not better.  Liver transplant - this pt would be a dialysis candidate for bridging given that she is expected to get a liver transplant as she no longer uses etoh. She has an appt w/ [facility] in [place] coming up in the next 1-2 weeks per the patient, and in [place] there is a long note from Jan 08th from [facility] regarding her and the liver +/- kidney transplant process.  Cirrhosis - decompensated cirrhosis Hypotension - taking midodrine here as at home 15 mg tid. BP's are soft, will ^ to 20 mg tid.  Anemia - hb 7x, transfuse prn      [name] 10/08/2022, 7:53 AM   Recent Labs  Lab 10/07/22 0239 10/07/22 1754 10/08/22 0242  HGB 6.1* 7.9* 8.8*  ALBUMIN 2.9*  --  2.8*  CALCIUM 8.5* 8.5* 8.6*  PHOS 3.5  --   --   CREATININE 3.09* 2.75* 2.64*  K 2.7* 4.4 5.1   No results for input(s): "IRON", "TIBC", "FERRITIN" in the last 168 hours. Inpatient medications:  sodium chloride   Intravenous Once   lactulose  30 g Oral BID   lidocaine (PF)  5 mL Intradermal Once   midodrine  15 mg Oral TID WC    Albumin human 25 % 75 g     albumin human     cefTRIAXone (ROCEPHIN)  IV Stopped (10/07/22 1428)   acetaminophen, oxyCODONE

## 2022-10-08 NOTE — Op Note (Signed)
[facility] Patient Name: [name] Procedure Date : 10/08/2022 MRN: [id number] Attending MD: Jeani Hawking , MD, [id number] Date of Birth: 01-Jul-1976 CSN: [id number] Age: 47 Admit Type: Inpatient Procedure:                Upper GI endoscopy Indications:              Iron deficiency anemia Providers:                Jeani Hawking, MD, Vicki Mallet, RN, Joannie Springs, Technician Referring MD:              Medicines:                Propofol per Anesthesia Complications:            No immediate complications. Estimated Blood Loss:     Estimated blood loss was minimal. Procedure:                Pre-Anesthesia Assessment:                           - Prior to the procedure, a History and Physical                            was performed, and patient medications and                            allergies were reviewed. The patient's tolerance of                            previous anesthesia was also reviewed. The risks                            and benefits of the procedure and the sedation                            options and risks were discussed with the patient.                            All questions were answered, and informed consent                            was obtained. Prior Anticoagulants: The patient has                            taken no anticoagulant or antiplatelet agents. ASA                            Grade Assessment: III - A patient with severe                            systemic disease. After reviewing the risks and  benefits, the patient was deemed in satisfactory                            condition to undergo the procedure.                           - Sedation was administered by an anesthesia                            professional. Deep sedation was attained.                           After obtaining informed consent, the endoscope was                            passed under direct  vision. Throughout the                            procedure, the patient's blood pressure, pulse, and                            oxygen saturations were monitored continuously. The                            GIF-H190 ([id number]) Olympus endoscope was introduced                            through the mouth, and advanced to the jejunum. The                            upper GI endoscopy was technically difficult and                            complex. The patient tolerated the procedure well. Scope In: Scope Out: Findings:      Large (> 5 mm) varices were found in the lower third of the esophagus.      Evidence of a Roux-en-Y gastrojejunostomy was found. The gastrojejunal       anastomosis was characterized by a hemorrhagic appearance. This was       traversed. The pouch-to-jejunum limb was characterized by healthy       appearing mucosa. Coagulation for hemostasis using monopolar probe was       successful. To stop active bleeding, two hemostatic clips were       successfully placed (MR safe). Clip manufacturer: [employer].       There was no bleeding at the end of the procedure.      The examined jejunum was normal.      Initial inspection of the upper GI tract was negative for any blood or       bleeding. Bleeding commenced at the gastrojejunostomy on the jejunal       side. The bleeding was not secondary to trauma as washing and close       inspection showed that there were a couple of small AVM-like vessels       oozing/streaming blood. APC was applied to the area and the vessels were  ablated. Two hemoclips were used to secure the site, a 360 and a Mantis       hemoclip. Impression:               - Large (> 5 mm) esophageal varices.                           - Roux-en-Y gastrojejunostomy with gastrojejunal                            anastomosis characterized by a hemorrhagic                            appearance. Treated with a monopolar probe. Clip                             manufacturer: [employer]. Clips (MR safe)                            were placed.                           - Normal examined jejunum.                           - No specimens collected. Recommendation:           - Return patient to hospital ward for ongoing care.                           - Clear liquid diet. If HGB is stable tomorrow her                            diet can be advanced back to the 2 gram sodium diet.                           - Continue present medications.                           - Continue to treat for HRS. Procedure Code(s):        --- Professional ---                           [phone], Esophagogastroduodenoscopy, flexible,                            transoral; with control of bleeding, any method Diagnosis Code(s):        --- Professional ---                           I85.00, Esophageal varices without bleeding                           Z98.0, Intestinal bypass and anastomosis status                           D50.9, Iron deficiency anemia, unspecified  CPT copyright 2022 American [employer] Association. All rights reserved. The codes documented in this report are preliminary and upon coder review may  be revised to meet current compliance requirements. Carol Ada, MD Carol Ada, MD 10/08/2022 12:13:37 PM This report has been signed electronically. Number of Addenda: 0

## 2022-10-08 NOTE — Anesthesia Postprocedure Evaluation (Signed)
Anesthesia Post Note  Patient: [name]  Procedure(s) Performed: ESOPHAGOGASTRODUODENOSCOPY (EGD) WITH PROPOFOL HOT HEMOSTASIS (ARGON PLASMA COAGULATION/BICAP)     Patient location during evaluation: PACU Anesthesia Type: MAC Level of consciousness: awake and alert Pain management: pain level controlled Vital Signs Assessment: post-procedure vital signs reviewed and stable Respiratory status: spontaneous breathing, nonlabored ventilation, respiratory function stable and patient connected to nasal cannula oxygen Cardiovascular status: stable and blood pressure returned to baseline Postop Assessment: no apparent nausea or vomiting Anesthetic complications: no   No notable events documented.  Last Vitals:  Vitals:   10/08/22 1220 10/08/22 1230  BP: 98/65 (!) 95/51  Pulse: 79 83  Resp: (!) 28 (!) 27  Temp:    SpO2: 99% 96%    Last Pain:  Vitals:   10/08/22 1210  TempSrc: Temporal  PainSc: [place]

## 2022-10-08 NOTE — Progress Notes (Addendum)
Pharmacy Terlipressin Consult Note   Follow up: no stock is available at this time. Terlipressin order has been delayed until stock is available and patient need will be reassessed at that time.  _______________________________________________ Assessment: [name] is a 47 y.o. year old female admitted on 10/06/2022. Pharmacy consulted to dose terlipressin for hepatorenal syndrome.   Patient Exclusion Criteria: If any screening criteria are checked as "Yes", then patient should NOT receive terlipressin until criteria item is corrected.   YES  NO Patient Exclusion Criteria  []  [x]  Experiencing Hypoxia (Sp02 <90%)  []  [x]   Serum Creatine > 5 mg/dL Lab Results  Component Value Date   CREATININE 2.64 (H) 10/08/2022    []  [x]   Ongoing coronary, peripheral, or mesenteric ischemia     Plan: Start terlipressin 0.85 mg IV q6h x 3 days F/u to reassess Scr on day 4 of treatment for dosing adjustments Recommendation to hold terlipressin if Sp02 falls <90%  Thank you for allowing pharmacy to participate in this patient's care.  Jerilynn Birkenhead 10/08/2022,8:15 AM

## 2022-10-08 NOTE — Anesthesia Preprocedure Evaluation (Signed)
Anesthesia Evaluation  Patient identified by MRN, date of birth, ID band Patient awake    Reviewed: Allergy & Precautions, H&P , NPO status , Patient's Chart, lab work & pertinent test results  Airway Mallampati: II   Neck ROM: full    Dental   Pulmonary Current Smoker and Patient abstained from smoking.   breath sounds clear to auscultation       Cardiovascular hypertension,  Rhythm:regular Rate:Normal  TTE (03/2022): EF 75%, normal valve function.   Neuro/Psych  Neuromuscular disease    GI/Hepatic ,GERD  ,,GI bleeding   Endo/Other    Renal/GU Renal InsufficiencyRenal disease     Musculoskeletal   Abdominal   Peds  Hematology  (+) Blood dyscrasia, anemia   Anesthesia Other Findings   Reproductive/Obstetrics                             Anesthesia Physical Anesthesia Plan  ASA: 2  Anesthesia Plan: MAC   Post-op Pain Management:    Induction: Intravenous  PONV Risk Score and Plan: 1 and Propofol infusion and Treatment may vary due to age or medical condition  Airway Management Planned: Nasal Cannula  Additional Equipment:   Intra-op Plan:   Post-operative Plan:   Informed Consent: I have reviewed the patients History and Physical, chart, labs and discussed the procedure including the risks, benefits and alternatives for the proposed anesthesia with the patient or authorized representative who has indicated his/her understanding and acceptance.     Dental advisory given  Plan Discussed with: CRNA, Anesthesiologist and Surgeon  Anesthesia Plan Comments:        Anesthesia Quick Evaluation

## 2022-10-08 NOTE — Progress Notes (Signed)
PROGRESS NOTE    [name]  ZOX:[id number] DOB: 1975/11/28 DOA: 10/06/2022 PCP: [name], NP   Brief Narrative:  47 y.o. 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  On presentation, creatinine was 3.16 (creatinine 2.55 less than a month ago) with hemoglobin of 7.7 (9.6 almost a month ago).  ED provider discussed with nephrology who recommended admission and IV albumin.  GI was also consulted.  Assessment & Plan:   AKI on CKD stage IIIb Acute metabolic acidosis -Probably prerenal in the setting of diuretics versus hepatorenal syndrome -Presented with creatinine of 3.16 (creatinine 2.55 less than a month ago)  -Received IV albumin on presentation.  Creatinine 2.64 today.  Bicarbonate 16 today.  Nephrology following.  Currently on IV albumin as per nephrology. -Strict input and output.  Daily weights.  Monitor renal function.  Decompensated alcoholic cirrhosis with ascites with elevated LFTs and thrombocytopenia with hepatic encephalopathy -Patient is currently completely abstinent of alcohol -Continue lactulose, ammonia 89 on presentation.  Patient apparently could not afford rifaximin recently as an outpatient -Diuretics on hold -Platelets 61 today. -LFTs currently stable.  Monitor -Patient has had recent recurrent paracentesis.  Apparently not a candidate for TIPS as per recent IR evaluation as an outpatient.  Status post ultrasound-guided paracentesis and removal of 3.8 L fluid on 10/07/2022.  Peritoneal fluid analysis not consistent with SBP.  Continue Rocephin -GI following  Coagulopathy in the setting of decompensated cirrhosis -INR 1.5.  Monitor signs of  bleeding  Chronic hypertension -Continue midodrine.  Blood pressure on the lower side.  Acute on chronic anemia of chronic disease/macrocytic anemia -No overt signs of bleeding currently. -Hemoglobin 7.7 on presentation.  Hemoglobin 6.1 on 10/07/2022.  Status post 1 unit packed red cells transfusion.  Monitor H&H.  Hemoglobin 8.8 today. -GI following and planning for EGD today  Hypokalemia -Improved  Chronic anxiety and depression -Resume home medications if mental status remains stable  Iron deficiency anemia -Resume ferrous sulfate once able to take orally  GERD -Start IV PPI  DVT prophylaxis: SCDs.   Code Status: Full Family Communication: None at bedside Disposition Plan: Status is: Inpatient Remains inpatient appropriate because: Of severity of illness  Consultants: GI/nephrology  Procedures: Ultrasound-guided paracentesis on 10/07/2022  Antimicrobials: Rocephin from 10/07/2022 onwards  Subjective: Patient seen and examined at bedside.  Abdominal distention is improving.  No fever, worsening shortness of breath or chest pain reported.  Objective: Vitals:   10/07/22 2107 10/08/22 0010 10/08/22 0500 10/08/22 0514  BP: (!) 97/50 (!) 101/43  (!) 94/47  Pulse: 97 98  94  Resp: 18 17  15   Temp: 99 F (37.2 C) 98.9 F (37.2 C)  98.4 F (36.9 C)  TempSrc: Oral Oral  Oral  SpO2: 100% 100%  98%  Weight:   78.9 kg   Height:        Intake/Output Summary (Last 24 hours) at 10/08/2022 0744 Last data filed at 10/08/2022 0648 Gross per 24 hour  Intake 762 ml  Output 100 ml  Net 662 ml    Filed Weights   10/06/22 1807 10/07/22 0117 10/08/22 0500  Weight: 78.5 kg 78.6 kg 78.9 kg    Examination:  General: On room air.  No distress.  Looks chronically ill and  deconditioned ENT/neck: No thyromegaly.  JVD is not elevated  respiratory: Decreased breath sounds at bases bilaterally with some crackles; no wheezing  CVS: S1-S2 heard, rate controlled currently Abdominal:  Soft, mildly tender, still distended but improving; no organomegaly, bowel sounds are heard Extremities: Trace lower extremity edema; no cyanosis  CNS: Awake and alert.  Slow to respond.  No focal neurologic deficit.  Moves extremities Lymph: No obvious lymphadenopathy Skin: No obvious ecchymosis/lesions  psych: No signs of agitation currently.  Affect is mostly flat. Musculoskeletal: No obvious joint swelling/deformity   Data Reviewed: I have personally reviewed following labs and imaging studies  CBC: Recent Labs  Lab 10/06/22 1821 10/06/22 2250 10/07/22 0239 10/07/22 1754 10/08/22 0242  WBC 8.1 7.0 5.7  --  8.5  NEUTROABS  --   --   --   --  5.4  HGB 7.7* 7.2* 6.1* 7.9* 8.8*  HCT 23.4* 21.7* 17.3* 22.6* 27.0*  MCV 106.8* 105.3* 101.8*  --  105.1*  PLT 66* 54* 46*  --  61*    Basic Metabolic Panel: Recent Labs  Lab 10/06/22 1821 10/06/22 2250 10/07/22 0239 10/07/22 1754 10/08/22 0242  NA 133*  --  136 137 139  K 3.0*  --  2.7* 4.4 5.1  CL 106  --  107 108 113*  CO2 18*  --  20* 18* 16*  GLUCOSE 101*  --  102* 89 81  BUN 26*  --  27* 26* 25*  CREATININE 3.16* 3.19* 3.09* 2.75* 2.64*  CALCIUM 8.6*  --  8.5* 8.5* 8.6*  MG  --   --  2.4  --  2.3  PHOS  --   --  3.5  --   --     GFR: Estimated Creatinine Clearance: 28.5 mL/min (A) (by C-G formula based on SCr of 2.64 mg/dL (H)). Liver Function Tests: Recent Labs  Lab 10/06/22 1821 10/07/22 0239 10/08/22 0242  AST 70* 50* 63*  ALT 26 21 25   ALKPHOS 131* 110 118  BILITOT 2.0* 1.6* 2.3*  PROT 7.1 6.0* 6.4*  ALBUMIN 2.5* 2.9* 2.8*    Recent Labs  Lab 10/06/22 1821  LIPASE 35    Recent Labs  Lab 10/06/22 2250  AMMONIA 89*    Coagulation Profile: Recent Labs  Lab 10/06/22 2250  INR 1.5*    Cardiac Enzymes: No results for input(s): "CKTOTAL", "CKMB", "CKMBINDEX", "TROPONINI" in the last 168 hours. BNP (last 3 results) No results for input(s): "PROBNP" in the last 8760 hours. HbA1C: No  results for input(s): "HGBA1C" in the last 72 hours. CBG: No results for input(s): "GLUCAP" in the last 168 hours. Lipid Profile: No results for input(s): "CHOL", "HDL", "LDLCALC", "TRIG", "CHOLHDL", "LDLDIRECT" in the last 72 hours. Thyroid Function Tests: No results for input(s): "TSH", "T4TOTAL", "FREET4", "T3FREE", "THYROIDAB" in the last 72 hours. Anemia Panel: No results for input(s): "VITAMINB12", "FOLATE", "FERRITIN", "TIBC", "IRON", "RETICCTPCT" in the last 72 hours. Sepsis Labs: Recent Labs  Lab 10/06/22 1821 10/06/22 2250  LATICACIDVEN 2.4* 2.5*     Recent Results (from the past 240 hour(s))  Gram stain     Status: None   Collection Time: 10/07/22 12:12 PM   Specimen: PATH Cytology Peritoneal fluid  Result Value Ref Range Status   Specimen Description PERITONEAL  Final   Special Requests NONE  Final   Gram Stain   Final    NO ORGANISMS SEEN NO WBC SEEN Performed at [facility], [address] [place], [place] [address]  Report Status 10/07/2022 FINAL  Final         Radiology Studies: US Paracentesis  Result Date: 10/07/2022 INDICATION: History of cirrhosis. Ascites. Request for diagnostic and therapeutic paracentesis. EXAM: ULTRASOUND GUIDED RIGHT LOWER QUADRANT PARACENTESIS MEDICATIONS: None. COMPLICATIONS: None immediate. PROCEDURE: Informed written consent was obtained from the patient after a discussion of the risks, benefits and alternatives to treatment. A timeout was performed prior to the initiation of the procedure. Initial ultrasound scanning demonstrates a large amount of ascites within the right lower abdominal quadrant. The right lower abdomen was prepped and draped in the usual sterile fashion. 1% lidocaine was used for local anesthesia. Following this, a 19 gauge, 7-cm, Yueh catheter was introduced. An ultrasound image was saved for documentation purposes. The paracentesis was performed. The catheter was removed and a dressing was applied.  The patient tolerated the procedure well without immediate post procedural complication. FINDINGS: A total of approximately 3.8 L of hazy yellow fluid was removed. Samples were sent to the laboratory as requested by the clinical team. IMPRESSION: Successful ultrasound-guided paracentesis yielding 3.8 liters of peritoneal fluid. Read by: Ascencion Dike PA-C Electronically Signed   By: Jerilynn Mages.  Shick M.D.   On: 10/07/2022 12:51        Scheduled Meds:  sodium chloride   Intravenous Once   lactulose  30 g Oral BID   lidocaine (PF)  5 mL Intradermal Once   midodrine  15 mg Oral TID WC   Continuous Infusions:  Albumin human 25 % 75 g     albumin human     cefTRIAXone (ROCEPHIN)  IV Stopped (10/07/22 1428)          Aline August, MD [facility] 10/08/2022, 7:44 AM

## 2022-10-08 NOTE — Plan of Care (Signed)

## 2022-10-08 NOTE — Transfer of Care (Signed)
Immediate Anesthesia Transfer of Care Note  Patient: [name]  Procedure(s) Performed: ESOPHAGOGASTRODUODENOSCOPY (EGD) WITH PROPOFOL HOT HEMOSTASIS (ARGON PLASMA COAGULATION/BICAP)  Patient Location: PACU  Anesthesia Type:MAC  Level of Consciousness: awake, alert , and oriented  Airway & Oxygen Therapy: Patient Spontanous Breathing and Patient connected to nasal cannula oxygen  Post-op Assessment: Report given to RN and Post -op Vital signs reviewed and stable  Post vital signs: Reviewed and stable  Last Vitals:  Vitals Value Taken Time  BP 111/63 10/08/22 1210  Temp    Pulse 84 10/08/22 1213  Resp 28 10/08/22 1213  SpO2 100 % 10/08/22 1213  Vitals shown include unvalidated device data.  Last Pain:  Vitals:   10/08/22 1130  TempSrc: Tympanic  PainSc: 0-No pain         Complications: No notable events documented.

## 2022-10-09 DIAGNOSIS — I8500 Esophageal varices without bleeding: Secondary | ICD-10-CM

## 2022-10-09 DIAGNOSIS — D649 Anemia, unspecified: Secondary | ICD-10-CM

## 2022-10-09 DIAGNOSIS — N179 Acute kidney failure, unspecified: Secondary | ICD-10-CM | POA: Diagnosis not present

## 2022-10-09 DIAGNOSIS — K7682 Hepatic encephalopathy: Secondary | ICD-10-CM

## 2022-10-09 DIAGNOSIS — K767 Hepatorenal syndrome: Secondary | ICD-10-CM

## 2022-10-09 LAB — CBC WITH DIFFERENTIAL/PLATELET
Abs Immature Granulocytes: 0.02 10*3/uL (ref 0.00–0.07)
Basophils Absolute: 0 10*3/uL (ref 0.0–0.1)
Basophils Relative: 1 %
Eosinophils Absolute: 0.1 10*3/uL (ref 0.0–0.5)
Eosinophils Relative: 1 %
HCT: 20.8 % — ABNORMAL LOW (ref 36.0–46.0)
Hemoglobin: 7.1 g/dL — ABNORMAL LOW (ref 12.0–15.0)
Immature Granulocytes: 0 %
Lymphocytes Relative: 31 %
Lymphs Abs: 1.9 10*3/uL (ref 0.7–4.0)
MCH: 34.6 pg — ABNORMAL HIGH (ref 26.0–34.0)
MCHC: 34.1 g/dL (ref 30.0–36.0)
MCV: 101.5 fL — ABNORMAL HIGH (ref 80.0–100.0)
Monocytes Absolute: 0.7 10*3/uL (ref 0.1–1.0)
Monocytes Relative: 11 %
Neutro Abs: 3.5 10*3/uL (ref 1.7–7.7)
Neutrophils Relative %: 56 %
Platelets: 43 10*3/uL — ABNORMAL LOW (ref 150–400)
RBC: 2.05 MIL/uL — ABNORMAL LOW (ref 3.87–5.11)
RDW: 21.4 % — ABNORMAL HIGH (ref 11.5–15.5)
WBC: 6.2 10*3/uL (ref 4.0–10.5)
nRBC: 0 % (ref 0.0–0.2)

## 2022-10-09 LAB — COMPREHENSIVE METABOLIC PANEL
ALT: 18 U/L (ref 0–44)
AST: 44 U/L — ABNORMAL HIGH (ref 15–41)
Albumin: 2.9 g/dL — ABNORMAL LOW (ref 3.5–5.0)
Alkaline Phosphatase: 71 U/L (ref 38–126)
Anion gap: 8 (ref 5–15)
BUN: 22 mg/dL — ABNORMAL HIGH (ref 6–20)
CO2: 19 mmol/L — ABNORMAL LOW (ref 22–32)
Calcium: 8.4 mg/dL — ABNORMAL LOW (ref 8.9–10.3)
Chloride: 110 mmol/L (ref 98–111)
Creatinine, Ser: 2.32 mg/dL — ABNORMAL HIGH (ref 0.44–1.00)
GFR, Estimated: 26 mL/min — ABNORMAL LOW (ref >60)
Glucose, Bld: 126 mg/dL — ABNORMAL HIGH (ref 70–99)
Potassium: 4.5 mmol/L (ref 3.5–5.1)
Sodium: 137 mmol/L (ref 135–145)
Total Bilirubin: 2.4 mg/dL — ABNORMAL HIGH (ref 0.3–1.2)
Total Protein: 5.6 g/dL — ABNORMAL LOW (ref 6.5–8.1)

## 2022-10-09 LAB — MAGNESIUM: Magnesium: 2.2 mg/dL (ref 1.7–2.4)

## 2022-10-09 MED ORDER — EMPTY CONTAINERS FLEXIBLE MISC
75.0000 g | Freq: Every day | Status: AC
  Administered 2022-10-09: 75 g via INTRAVENOUS
  Filled 2022-10-09: qty 300

## 2022-10-09 MED ORDER — ADULT MULTIVITAMIN W/MINERALS CH
1.0000 | ORAL_TABLET | Freq: Every day | ORAL | Status: DC
  Administered 2022-10-09 – 2022-10-10 (×2): 1 via ORAL
  Filled 2022-10-09 (×2): qty 1

## 2022-10-09 MED ORDER — ENSURE ENLIVE PO LIQD
237.0000 mL | Freq: Two times a day (BID) | ORAL | Status: DC
  Administered 2022-10-10: 237 mL via ORAL

## 2022-10-09 NOTE — Plan of Care (Signed)

## 2022-10-09 NOTE — Progress Notes (Signed)
Initial Nutrition Assessment  DOCUMENTATION CODES:   Non-severe (moderate) malnutrition in context of chronic illness  INTERVENTION:   Multivitamin w/ minerals daily Ensure Enlive po BID, each supplement provides 350 kcal and 20 grams of protein. Encourage good PO intake  NUTRITION DIAGNOSIS:   Moderate Malnutrition related to chronic illness (cirrhosis) as evidenced by moderate fat depletion, moderate muscle depletion.  GOAL:   Patient will meet greater than or equal to 90% of their needs  MONITOR:   PO intake, Supplement acceptance, Labs, Weight trends  REASON FOR ASSESSMENT:   Malnutrition Screening Tool    ASSESSMENT:   47 y.o. female presented to the ED per recommendation of nephrologist due t elevate creatinine level. 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 Pt admitted with AKI on CKD, hypokalemia, acute blood loss anemia.    1/12 - Admitted 1/14 - EGD  Pt familiar to RD from previous admission. Pt reports that her appetite has been ok since previous discharge, has been keeping an eye on her sodium levels. Pt showed RD nutrition packet she had received from [facility] with the transplant clinic. Pt shares that she was drinking Nepro at home, but they are expensive. RD to switch pt to Ensure and provide pt with coupons. Pt endorses that she often feels full fast; RD explained that fluid collection around the abdomen can cause early satiety. RD observed pt eating ~50% of her lunch.  Pt endorses ongoing weight loss. States that there weight was over 200# in June/July of last year and that she now weighs 173#. Pt with a 21.4% weight loss in the past 6 months, this is clinically significant for time frame. Although, pt with multiple paracentesis during recent admission and unable to determine fluid loss from weight loss.  Wt Readings from Last 15 Encounters:  10/08/22 78.9 kg  09/20/22 80.4 kg  08/14/22 83.5 kg   07/19/22 83.9 kg  07/18/22 83.6 kg  07/12/22 84.5 kg  07/03/22 84.8 kg  06/21/22 82.6 kg  06/07/22 84.3 kg  05/30/22 86 kg  05/16/22 88.5 kg  05/05/22 92.5 kg  05/03/22 95.2 kg  04/22/22 100.4 kg  01/18/22 99.2 kg   Medications reviewed and include: Lactulose, Protonix, IV antibiotics  Labs reviewed: BUN 22, Creatinine 2.32  NUTRITION - FOCUSED PHYSICAL EXAM:  Flowsheet Row Most Recent Value  Orbital Region Moderate depletion  Upper Arm Region Severe depletion  Thoracic and Lumbar Region Moderate depletion  Buccal Region Moderate depletion  Temple Region Mild depletion  Clavicle Bone Region Moderate depletion  Clavicle and Acromion Bone Region Moderate depletion  Scapular Bone Region Moderate depletion  Dorsal Hand Moderate depletion  Patellar Region Unable to assess  Anterior Thigh Region Unable to assess  Posterior Calf Region Unable to assess  Hair Unable to assess  Eyes Reviewed  Mouth Reviewed  Skin Reviewed  Nails Reviewed   Diet Order:   Diet Order             Diet 2 gram sodium Room service appropriate? Yes; Fluid consistency: Thin  Diet effective now       See Hyperspace for full Linked Orders Report.              EDUCATION NEEDS:   No education needs have been identified at this time  Skin:  Skin Assessment: Reviewed RN Assessment  Last BM:  1/14  Height:   Ht Readings from Last 1 Encounters:  10/08/22 5\' 6"  (1.676 m)  Weight:   Wt Readings from Last 1 Encounters:  10/08/22 78.9 kg    Ideal Body Weight:  59.1 kg  BMI:  Body mass index is 28.08 kg/m.  Estimated Nutritional Needs:   Kcal:  2100-2300  Protein:  105-120 grams  Fluid:  >/= 2 L    Hermina Barters RD, LDN Clinical Dietitian See [facility] for contact information.

## 2022-10-09 NOTE — Progress Notes (Signed)
PROGRESS NOTE    [name]  [id number] DOB: 08-13-1976 DOA: 10/06/2022 PCP: Kerin Perna, NP   Brief Narrative:  47 y.o. 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  On presentation, creatinine was 3.16 (creatinine 2.55 less than a month ago) with hemoglobin of 7.7 (9.6 almost a month ago).  ED provider discussed with nephrology who recommended admission and IV albumin.  GI was also consulted.  She underwent EGD on 10/08/2022  Assessment & Plan:   AKI on CKD stage IIIb Acute metabolic acidosis -Probably prerenal in the setting of diuretics versus hepatorenal syndrome -Presented with creatinine of 3.16 (creatinine 2.55 less than a month ago)  -Received IV albumin on presentation.  Creatinine 2.32 today.  Bicarbonate 19 today.  Nephrology following.  Currently on IV albumin as per nephrology.  Also on octreotide as per nephrology -Strict input and output.  Daily weights.  Monitor renal function.  Decompensated alcoholic cirrhosis with ascites with elevated LFTs and thrombocytopenia with hepatic encephalopathy -Patient is currently completely abstinent of alcohol -Continue lactulose, ammonia 89 on presentation.  Patient apparently could not afford rifaximin recently as an outpatient -Diuretics on hold -Platelets 43 today. -LFTs currently stable.  Monitor -Patient has had recent recurrent paracentesis.  Apparently not a candidate for TIPS as per recent IR evaluation as an outpatient.  Status post ultrasound-guided paracentesis and removal of 3.8 L fluid on 10/07/2022.  Peritoneal fluid analysis not consistent with SBP.  Continue Rocephin -GI following: Status post EGD on  10/08/2022 which showed esophageal varices treated with clips along with hemorrhagic appearance of gastrojejunal anastomosis Roux-en-Y gastrojejunostomy treated with monopolar probe.  Coagulopathy in the setting of decompensated cirrhosis -INR 1.5.  Monitor signs of bleeding  Chronic hypertension -Continue midodrine.  Blood pressure on the lower side.  Acute on chronic anemia of chronic disease/macrocytic anemia -No overt signs of bleeding currently. -Hemoglobin 7.7 on presentation.  Hemoglobin 6.1 on 10/07/2022.  Status post 1 unit packed red cells transfusion.  Monitor H&H.  Hemoglobin 7.1 today.  Transfuse if hemoglobin is less than 7. -EGD as above  Hypokalemia -Improved  Chronic anxiety and depression -Resume home medications if mental status remains stable  Iron deficiency anemia -Resume ferrous sulfate once able to take orally  GERD -Continue IV PPI  DVT prophylaxis: SCDs.   Code Status: Full Family Communication: None at bedside Disposition Plan: Status is: Inpatient Remains inpatient appropriate because: Of severity of illness  Consultants: GI/nephrology  Procedures: Ultrasound-guided paracentesis on 10/07/2022 EGD: 10/08/2022 Impression:               - Large (> 5 mm) esophageal varices.                           - Roux-en-Y gastrojejunostomy with gastrojejunal                            anastomosis characterized by a hemorrhagic                            appearance. Treated with a monopolar probe. Clip  manufacturer: [employer]. Clips (MR safe)                            were placed.                           - Normal examined jejunum.                           - No specimens collected. Recommendation:           - Return patient to hospital ward for ongoing care.                           - Clear liquid diet. If HGB is stable tomorrow her                            diet can be advanced back to the 2 gram sodium diet.                            - Continue present medications.                           - Continue to treat for HRS.  Antimicrobials: Rocephin from 10/07/2022 onwards  Subjective: Patient seen and examined at bedside.  Denies worsening shortness of breath, fever or vomiting.  Feels slightly better. Objective: Vitals:   10/08/22 2006 10/09/22 0100 10/09/22 0613 10/09/22 0745  BP: (!) 96/52 (!) 101/58 (!) 97/55 99/62  Pulse: 73 70 78 77  Resp: 16 18 18 16   Temp: 98.4 F (36.9 C) 98.2 F (36.8 C) 98.4 F (36.9 C) 98.4 F (36.9 C)  TempSrc: Oral Oral Oral Oral  SpO2: 100% 98% 99% 96%  Weight:      Height:        Intake/Output Summary (Last 24 hours) at 10/09/2022 0809 Last data filed at 10/08/2022 1611 Gross per 24 hour  Intake 264.79 ml  Output --  Net 264.79 ml    Filed Weights   10/07/22 0117 10/08/22 0500 10/08/22 1130  Weight: 78.6 kg 78.9 kg 78.9 kg    Examination:  General: No acute distress.  On room air currently.  Looks chronically ill and deconditioned ENT/neck: No JVD elevation or palpable thyromegaly noted  respiratory: Bilaterally respirations are regular with basilar crackles  CVS: Mostly rate controlled; S1 and S2 are heard Abdominal: Soft, mildly tender, still showing signs of distention; no organomegaly, bowel sounds heard normally Extremities: No clubbing; mild lower extremity edema present CNS: Alert and oriented but still slow to respond.  No focal neurologic deficit.  Able to move extremities  lymph: No cervical lymphadenopathy palpable Skin: No obvious petechiae/rashes psych: Flat affect mostly.  Not agitated. Musculoskeletal: No obvious joint tenderness/swelling/deformity   Data Reviewed: I have personally reviewed following labs and imaging studies  CBC: Recent Labs  Lab 10/06/22 1821 10/06/22 2250 10/07/22 0239 10/07/22 1754 10/08/22 0242 10/09/22 0250  WBC 8.1 7.0 5.7  --  8.5 6.2  NEUTROABS  --   --   --   --  5.4 3.5  HGB 7.7* 7.2* 6.1* 7.9* 8.8*  7.1*  HCT 23.4* 21.7* 17.3* 22.6* 27.0* 20.8*  MCV 106.8* 105.3* 101.8*  --  105.1* 101.5*  PLT 66* 54* 46*  --  61* 43*    Basic Metabolic Panel: Recent Labs  Lab 10/06/22 1821 10/06/22 2250 10/07/22 0239 10/07/22 1754 10/08/22 0242 10/09/22 0250  NA 133*  --  136 137 139 137  K 3.0*  --  2.7* 4.4 5.1 4.5  CL 106  --  107 108 113* 110  CO2 18*  --  20* 18* 16* 19*  GLUCOSE 101*  --  102* 89 81 126*  BUN 26*  --  27* 26* 25* 22*  CREATININE 3.16* 3.19* 3.09* 2.75* 2.64* 2.32*  CALCIUM 8.6*  --  8.5* 8.5* 8.6* 8.4*  MG  --   --  2.4  --  2.3 2.2  PHOS  --   --  3.5  --   --   --     GFR: Estimated Creatinine Clearance: 32.4 mL/min (A) (by C-G formula based on SCr of 2.32 mg/dL (H)). Liver Function Tests: Recent Labs  Lab 10/06/22 1821 10/07/22 0239 10/08/22 0242 10/09/22 0250  AST 70* 50* 63* 44*  ALT 26 21 25 18   ALKPHOS 131* 110 118 71  BILITOT 2.0* 1.6* 2.3* 2.4*  PROT 7.1 6.0* 6.4* 5.6*  ALBUMIN 2.5* 2.9* 2.8* 2.9*    Recent Labs  Lab 10/06/22 1821  LIPASE 35    Recent Labs  Lab 10/06/22 2250  AMMONIA 89*    Coagulation Profile: Recent Labs  Lab 10/06/22 2250  INR 1.5*    Cardiac Enzymes: No results for input(s): "CKTOTAL", "CKMB", "CKMBINDEX", "TROPONINI" in the last 168 hours. BNP (last 3 results) No results for input(s): "PROBNP" in the last 8760 hours. HbA1C: No results for input(s): "HGBA1C" in the last 72 hours. CBG: No results for input(s): "GLUCAP" in the last 168 hours. Lipid Profile: No results for input(s): "CHOL", "HDL", "LDLCALC", "TRIG", "CHOLHDL", "LDLDIRECT" in the last 72 hours. Thyroid Function Tests: No results for input(s): "TSH", "T4TOTAL", "FREET4", "T3FREE", "THYROIDAB" in the last 72 hours. Anemia Panel: No results for input(s): "VITAMINB12", "FOLATE", "FERRITIN", "TIBC", "IRON", "RETICCTPCT" in the last 72 hours. Sepsis Labs: Recent Labs  Lab 10/06/22 1821 10/06/22 2250  LATICACIDVEN 2.4* 2.5*     Recent  Results (from the past 240 hour(s))  Gram stain     Status: None   Collection Time: 10/07/22 12:12 PM   Specimen: PATH Cytology Peritoneal fluid  Result Value Ref Range Status   Specimen Description PERITONEAL  Final   Special Requests NONE  Final   Gram Stain   Final    NO ORGANISMS SEEN NO WBC SEEN Performed at [facility], [address], [place], [place] [address]    Report Status 10/07/2022 FINAL  Final  Culture, body fluid w Gram Stain-bottle     Status: None (Preliminary result)   Collection Time: 10/07/22 12:12 PM   Specimen: Peritoneal Washings  Result Value Ref Range Status   Specimen Description PERITONEAL  Final   Special Requests NONE  Final   Culture   Final    NO GROWTH < 24 HOURS Performed at [facility], [address], [place], [place] [address]    Report Status PENDING  Incomplete         Radiology Studies: US Paracentesis  Result Date: 10/07/2022 INDICATION: History of cirrhosis. Ascites. Request for diagnostic and therapeutic paracentesis. EXAM: ULTRASOUND GUIDED RIGHT LOWER QUADRANT PARACENTESIS MEDICATIONS: None. COMPLICATIONS: None immediate. PROCEDURE: Informed written consent was obtained from the patient after a discussion of the  risks, benefits and alternatives to treatment. A timeout was performed prior to the initiation of the procedure. Initial ultrasound scanning demonstrates a large amount of ascites within the right lower abdominal quadrant. The right lower abdomen was prepped and draped in the usual sterile fashion. 1% lidocaine was used for local anesthesia. Following this, a 19 gauge, 7-cm, Yueh catheter was introduced. An ultrasound image was saved for documentation purposes. The paracentesis was performed. The catheter was removed and a dressing was applied. The patient tolerated the procedure well without immediate post procedural complication. FINDINGS: A total of approximately 3.8 L of hazy yellow fluid was removed. Samples were  sent to the laboratory as requested by the clinical team. IMPRESSION: Successful ultrasound-guided paracentesis yielding 3.8 liters of peritoneal fluid. Read by: Ascencion Dike PA-C Electronically Signed   By: Jerilynn Mages.  Shick M.D.   On: 10/07/2022 12:51        Scheduled Meds:  sodium chloride   Intravenous Once   lactulose  30 g Oral BID   lidocaine (PF)  5 mL Intradermal Once   midodrine  20 mg Oral TID WC   octreotide  150 mcg Subcutaneous BID   pantoprazole (PROTONIX) IV  40 mg Intravenous Q12H   [START ON 10/11/2022] terlipressin  0.85 mg Intravenous Q6H   Continuous Infusions:  Albumin human 25 % 75 g     cefTRIAXone (ROCEPHIN)  IV Stopped (10/08/22 1600)          Aline August, MD [facility] 10/09/2022, 8:09 AM

## 2022-10-09 NOTE — Progress Notes (Signed)
Progress Note   Subjective  Patient feeling okay today.  She is hungry and wants to eat.  She had an EGD yesterday, no further bleeding symptoms although her hemoglobin has drifted down.  She denies any pain.  Renal function fortunately improving.     Objective   Vital signs in last 24 hours: Temp:  [98.2 F (36.8 C)-98.4 F (36.9 C)] 98.4 F (36.9 C) (01/15 0745) Pulse Rate:  [70-78] 77 (01/15 0745) Resp:  [14-18] 16 (01/15 0745) BP: (96-101)/(52-62) 99/62 (01/15 0745) SpO2:  [96 %-100 %] 96 % (01/15 0745) Last BM Date : 10/08/22 General:    AA female in NAD Neurologic:  Alert and oriented,  grossly normal neurologically. Psych:  Cooperative. Normal mood and affect.  Intake/Output from previous day: 01/14 0701 - 01/15 0700 In: 264.8 [I.V.:100; IV Piggyback:164.8] Out: -  Intake/Output this shift: No intake/output data recorded.  Lab Results: Recent Labs    10/07/22 0239 10/07/22 1754 10/08/22 0242 10/09/22 0250  WBC 5.7  --  8.5 6.2  HGB 6.1* 7.9* 8.8* 7.1*  HCT 17.3* 22.6* 27.0* 20.8*  PLT 46*  --  61* 43*   BMET Recent Labs    10/07/22 1754 10/08/22 0242 10/09/22 0250  NA 137 139 137  K 4.4 5.1 4.5  CL 108 113* 110  CO2 18* 16* 19*  GLUCOSE 89 81 126*  BUN 26* 25* 22*  CREATININE 2.75* 2.64* 2.32*  CALCIUM 8.5* 8.6* 8.4*   LFT Recent Labs    10/09/22 0250  PROT 5.6*  ALBUMIN 2.9*  AST 44*  ALT 18  ALKPHOS 71  BILITOT 2.4*   PT/INR Recent Labs    10/06/22 2250  LABPROT 17.5*  INR 1.5*    Studies/Results: No results found.     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 Benson Norway yesterday reviewed, friable surgical anastomosis and some oozing from the small bowel secondary to vascular lesion, treated with  APC.  She has had no overt bleeding and BUN is downtrending however hemoglobin has drifted, likely reequilibration.  Will need to trend hemoglobin and transfuse to keep hemoglobin greater than 7.  She is very hungry and wants to eat, will advance to soft diet today, less than 2 g/day sodium diet otherwise.  We discussed her cirrhosis, she has appointments in [place] later this month to prepare for transplant.  Most concerning is her renal function with suspected HRS.  She has been followed by nephrology and treated with albumin infusions, octreotide, and midodrine.  Her renal function today is improved which is excellent news.  Defer to them about terlipressin which was supposed to be started tomorrow.    Of note she has esophageal varices on EGD, I discussed this with Dr. Benson Norway.  They do not look large to me on images and no high risk stigmata for bleeding, they were not banded yesterday.  Unfortunately with her renal function she is not a good candidate for nonspecific beta-blockade to treat the varices.  Treatment for this would be with banding to prevent bleeding until she can get to transplant.  There is no high risk stigmata for bleeding noted on the exam yesterday.  Can discuss with her if she wants to have an EGD prior to discharge with banding or monitor  for now.  Otherwise, encephalopathy otherwise appears controlled on lactulose, she could not afford rifaximin as outpatient.  PLAN: - advance to soft diet, low Na diet - monitor for recurrent bleeding - trend Hgb, keep Hgb > 7 - continue supportive care for HRS per nephrology - albumin, octreotide, midocrine for now - may consider adding terlipressin pending her course. Renal function improving - continue ceftriaxone for SBP prophylaxis - continue lactulose - consideration for EGD at some point with banding of varices prior to discharge, she is hoping to leave the hospital ASAP and avoid further procedures if possible.  No stigmata for  bleeding on her last exam, as above unfortunately not a candidate for beta-blockade right now  Will follow, call with questions.  Jolly Mango, MD [facility]

## 2022-10-09 NOTE — Progress Notes (Signed)
Patient ID: [name], female   DOB: [birth year], 47 y.o.   MRN: [id number] S: Feeling well and wants to go home.  Had EGD yesterday and found to have bleeding at surgical anastamosis and treated with monopolar probe and clip.  She was also noted to have esophageal varices. O:BP 99/62 (BP Location: Left Arm)   Pulse 77   Temp 98.4 F (36.9 C) (Oral)   Resp 16   Ht 5\' 6"  (1.676 m)   Wt 78.9 kg   LMP 03/10/2022 (Approximate)   SpO2 96%   BMI 28.08 kg/m   Intake/Output Summary (Last 24 hours) at 10/09/2022 1443 Last data filed at 10/08/2022 1611 Gross per 24 hour  Intake 164.79 ml  Output --  Net 164.79 ml   Intake/Output: I/O last 3 completed shifts: In: 484.8 [P.O.:120; I.V.:100; IV Piggyback:264.8] Out: -   Intake/Output this shift:  No intake/output data recorded. Weight change: 0 kg Gen:NAD CVS: RRR Resp: CTA Abd: +BS, soft, NT/ND Ext: trace pretibial and ankle edema bilaterally  Recent Labs  Lab 10/06/22 1821 10/06/22 2250 10/07/22 0239 10/07/22 1754 10/08/22 0242 10/09/22 0250  NA 133*  --  136 137 139 137  K 3.0*  --  2.7* 4.4 5.1 4.5  CL 106  --  107 108 113* 110  CO2 18*  --  20* 18* 16* 19*  GLUCOSE 101*  --  102* 89 81 126*  BUN 26*  --  27* 26* 25* 22*  CREATININE 3.16* 3.19* 3.09* 2.75* 2.64* 2.32*  ALBUMIN 2.5*  --  2.9*  --  2.8* 2.9*  CALCIUM 8.6*  --  8.5* 8.5* 8.6* 8.4*  PHOS  --   --  3.5  --   --   --   AST 70*  --  50*  --  63* 44*  ALT 26  --  21  --  25 18   Liver Function Tests: Recent Labs  Lab 10/07/22 0239 10/08/22 0242 10/09/22 0250  AST 50* 63* 44*  ALT 21 25 18   ALKPHOS 110 118 71  BILITOT 1.6* 2.3* 2.4*  PROT 6.0* 6.4* 5.6*  ALBUMIN 2.9* 2.8* 2.9*   Recent Labs  Lab 10/06/22 1821  LIPASE 35   Recent Labs  Lab 10/06/22 2250  AMMONIA 89*   CBC: Recent Labs  Lab 10/06/22 1821 10/06/22 2250 10/07/22 0239 10/07/22 1754 10/08/22 0242 10/09/22 0250  WBC 8.1 7.0 5.7  --  8.5 6.2  NEUTROABS  --   --   --    --  5.4 3.5  HGB 7.7* 7.2* 6.1* 7.9* 8.8* 7.1*  HCT 23.4* 21.7* 17.3* 22.6* 27.0* 20.8*  MCV 106.8* 105.3* 101.8*  --  105.1* 101.5*  PLT 66* 54* 46*  --  61* 43*   Cardiac Enzymes: No results for input(s): "CKTOTAL", "CKMB", "CKMBINDEX", "TROPONINI" in the last 168 hours. CBG: No results for input(s): "GLUCAP" in the last 168 hours.  Iron Studies: No results for input(s): "IRON", "TIBC", "TRANSFERRIN", "FERRITIN" in the last 72 hours. Studies/Results: No results found.  sodium chloride   Intravenous Once   lactulose  30 g Oral BID   lidocaine (PF)  5 mL Intradermal Once   midodrine  20 mg Oral TID WC   octreotide  150 mcg Subcutaneous BID   pantoprazole (PROTONIX) IV  40 mg Intravenous Q12H   [START ON 10/11/2022] terlipressin  0.85 mg Intravenous Q6H    BMET    Component Value Date/Time   NA 137 10/09/2022  0250   NA 140 05/16/2022 1633   K 4.5 10/09/2022 0250   CL 110 10/09/2022 0250   CO2 19 (L) 10/09/2022 0250   GLUCOSE 126 (H) 10/09/2022 0250   BUN 22 (H) 10/09/2022 0250   BUN 16 05/16/2022 1633   CREATININE 2.32 (H) 10/09/2022 0250   CREATININE 1.33 (H) 07/12/2022 1123   CREATININE 1.21 (H) 07/03/2022 1550   CALCIUM 8.4 (L) 10/09/2022 0250   GFRNONAA 26 (L) 10/09/2022 0250   GFRNONAA 51 (L) 07/12/2022 1123   GFRAA >60 11/24/2019 0314   CBC    Component Value Date/Time   WBC 6.2 10/09/2022 0250   RBC 2.05 (L) 10/09/2022 0250   HGB 7.1 (L) 10/09/2022 0250   HGB 10.7 (L) 07/12/2022 1123   HGB 10.0 (L) 05/16/2022 1633   HCT 20.8 (L) 10/09/2022 0250   HCT 27.7 (L) 05/16/2022 1633   PLT 43 (L) 10/09/2022 0250   PLT 101 (L) 07/12/2022 1123   PLT 135 (L) 05/16/2022 1633   MCV 101.5 (H) 10/09/2022 0250   MCV 94 05/16/2022 1633   MCH 34.6 (H) 10/09/2022 0250   MCHC 34.1 10/09/2022 0250   RDW 21.4 (H) 10/09/2022 0250   RDW 19.0 (H) 05/16/2022 1633   LYMPHSABS 1.9 10/09/2022 0250   LYMPHSABS 1.7 05/16/2022 1633   MONOABS 0.7 10/09/2022 0250   EOSABS 0.1  10/09/2022 0250   EOSABS 0.1 05/16/2022 1633   BASOSABS 0.0 10/09/2022 0250   BASOSABS 0.0 05/16/2022 1633     Assessment/Plan:  AKI/CKD stage IIIb - baseline Scr 1.4-1.8.  Scr 3.1 on admission and likely due to pre-renal insults +/- ischemic ATN in setting of ABLA.  Initially thought to be related to HRS and given octreotide and midodrine.  She has also received IV albumin.  Continue for now but can stop tomorrow if she is being discharged.  She has follow up in our office with Dr. Candiss Norse this week. Decompensated cirrhosis with ascites and abnormal LFT's - continue lactulose, diuretics on hold for now. She had large volume paracentesis on 10/07/22. ABLA - bleeding seen on EGD at surgical anastamosis and treated with monopolar probe and clips.  Follow H/H and transfuse prn. Hypokalemia - resolved Thrombocytopenia - chronic Disposition - hopeful discharge tomorrow if no further bleeding.   Donetta Potts, MD [facility]

## 2022-10-10 DIAGNOSIS — I8500 Esophageal varices without bleeding: Secondary | ICD-10-CM

## 2022-10-10 DIAGNOSIS — K7031 Alcoholic cirrhosis of liver with ascites: Secondary | ICD-10-CM | POA: Diagnosis not present

## 2022-10-10 DIAGNOSIS — N179 Acute kidney failure, unspecified: Secondary | ICD-10-CM | POA: Diagnosis not present

## 2022-10-10 DIAGNOSIS — K7682 Hepatic encephalopathy: Secondary | ICD-10-CM

## 2022-10-10 DIAGNOSIS — E440 Moderate protein-calorie malnutrition: Secondary | ICD-10-CM | POA: Insufficient documentation

## 2022-10-10 LAB — CBC WITH DIFFERENTIAL/PLATELET
Abs Immature Granulocytes: 0.04 10*3/uL (ref 0.00–0.07)
Basophils Absolute: 0.1 10*3/uL (ref 0.0–0.1)
Basophils Relative: 1 %
Eosinophils Absolute: 0.1 10*3/uL (ref 0.0–0.5)
Eosinophils Relative: 2 %
HCT: 20.5 % — ABNORMAL LOW (ref 36.0–46.0)
Hemoglobin: 7 g/dL — ABNORMAL LOW (ref 12.0–15.0)
Immature Granulocytes: 1 %
Lymphocytes Relative: 26 %
Lymphs Abs: 2 10*3/uL (ref 0.7–4.0)
MCH: 34.8 pg — ABNORMAL HIGH (ref 26.0–34.0)
MCHC: 34.1 g/dL (ref 30.0–36.0)
MCV: 102 fL — ABNORMAL HIGH (ref 80.0–100.0)
Monocytes Absolute: 1.1 10*3/uL — ABNORMAL HIGH (ref 0.1–1.0)
Monocytes Relative: 13 %
Neutro Abs: 4.6 10*3/uL (ref 1.7–7.7)
Neutrophils Relative %: 57 %
Platelets: 48 10*3/uL — ABNORMAL LOW (ref 150–400)
RBC: 2.01 MIL/uL — ABNORMAL LOW (ref 3.87–5.11)
RDW: 21 % — ABNORMAL HIGH (ref 11.5–15.5)
WBC: 7.8 10*3/uL (ref 4.0–10.5)
nRBC: 0 % (ref 0.0–0.2)

## 2022-10-10 LAB — COMPREHENSIVE METABOLIC PANEL
ALT: 15 U/L (ref 0–44)
AST: 33 U/L (ref 15–41)
Albumin: 3.2 g/dL — ABNORMAL LOW (ref 3.5–5.0)
Alkaline Phosphatase: 71 U/L (ref 38–126)
Anion gap: 6 (ref 5–15)
BUN: 18 mg/dL (ref 6–20)
CO2: 19 mmol/L — ABNORMAL LOW (ref 22–32)
Calcium: 8.7 mg/dL — ABNORMAL LOW (ref 8.9–10.3)
Chloride: 110 mmol/L (ref 98–111)
Creatinine, Ser: 2.31 mg/dL — ABNORMAL HIGH (ref 0.44–1.00)
GFR, Estimated: 26 mL/min — ABNORMAL LOW (ref >60)
Glucose, Bld: 128 mg/dL — ABNORMAL HIGH (ref 70–99)
Potassium: 4.1 mmol/L (ref 3.5–5.1)
Sodium: 135 mmol/L (ref 135–145)
Total Bilirubin: 1.6 mg/dL — ABNORMAL HIGH (ref 0.3–1.2)
Total Protein: 5.6 g/dL — ABNORMAL LOW (ref 6.5–8.1)

## 2022-10-10 LAB — HEMOGLOBIN AND HEMATOCRIT, BLOOD
HCT: 25.3 % — ABNORMAL LOW (ref 36.0–46.0)
Hemoglobin: 8.7 g/dL — ABNORMAL LOW (ref 12.0–15.0)

## 2022-10-10 LAB — PREPARE RBC (CROSSMATCH)

## 2022-10-10 LAB — CYTOLOGY - NON PAP

## 2022-10-10 LAB — MAGNESIUM: Magnesium: 2.3 mg/dL (ref 1.7–2.4)

## 2022-10-10 MED ORDER — PANTOPRAZOLE SODIUM 40 MG PO TBEC: 40.0000 mg | DELAYED_RELEASE_TABLET | Freq: Two times a day (BID) | ORAL | Status: DC

## 2022-10-10 MED ORDER — RIFAXIMIN 550 MG PO TABS
550.0000 mg | ORAL_TABLET | Freq: Two times a day (BID) | ORAL | Status: DC
  Administered 2022-10-10: 550 mg via ORAL
  Filled 2022-10-10: qty 1

## 2022-10-10 MED ORDER — PANTOPRAZOLE SODIUM 40 MG PO TBEC
40.0000 mg | DELAYED_RELEASE_TABLET | Freq: Two times a day (BID) | ORAL | Status: DC
  Administered 2022-10-10: 40 mg via ORAL
  Filled 2022-10-10: qty 1

## 2022-10-10 MED ORDER — RIFAXIMIN 550 MG PO TABS: 550.0000 mg | ORAL_TABLET | Freq: Two times a day (BID) | ORAL | Status: AC

## 2022-10-10 MED ORDER — LACTULOSE 10 GM/15ML PO SOLN: 10.0000 g | Freq: Two times a day (BID) | ORAL | Status: DC

## 2022-10-10 MED ORDER — SODIUM CHLORIDE 0.9% IV SOLUTION: Freq: Once | INTRAVENOUS | Status: AC

## 2022-10-10 MED ORDER — LACTULOSE 10 GM/15ML PO SOLN: 10.0000 g | Freq: Every day | ORAL | 1 refills | Status: DC

## 2022-10-10 MED ORDER — MIDODRINE HCL 10 MG PO TABS: 20.0000 mg | ORAL_TABLET | Freq: Three times a day (TID) | ORAL | 0 refills | Status: AC

## 2022-10-10 NOTE — Progress Notes (Addendum)
Daily Rounding Note  10/10/2022, 8:26 AM  LOS: 4 days   SUBJECTIVE:   Chief complaint:  decompensated cirrhosis.  IDA.  Ascites.  AKI/HRS   Getting unit of blood today for Hgb down to 7.  Stable hypotension.   No melena.  Having 3 to 4 greenish soft to liquid stools daily, and during sleep hours.  Her rectum is sore.   Good appetite.   Belly once again feeling tight, not yet painful.   Walking in room wo dizziness, weakness.    OBJECTIVE:         Vital signs in last 24 hours:    Temp:  [98.4 F (36.9 C)-99.4 F (37.4 C)] 99.4 F (37.4 C) (01/16 0816) Pulse Rate:  [62-83] 83 (01/16 0816) Resp:  [16-18] 17 (01/16 0816) BP: (92-102)/(48-58) 96/51 (01/16 0816) SpO2:  [96 %-100 %] 96 % (01/16 0816) Last BM Date : 10/08/22 Filed Weights   10/07/22 0117 10/08/22 0500 10/08/22 1130  Weight: 78.6 kg 78.9 kg 78.9 kg   General: scleral icterus.  Looks mildly ill.  Alert, comfortable   Heart: RRR Chest: clear bil w reduced BS.  Loose cough Abdomen: tense, distended.  NT.  Muffled BS  Extremities: slight pedal edema Neuro/Psych:  pleasant, alert, no confusion.  No asterixis  Intake/Output from previous day: 01/15 0701 - 01/16 0700 In: 562.7 [P.O.:480; IV Piggyback:82.7] Out: -   Intake/Output this shift: No intake/output data recorded.  Lab Results: Recent Labs    10/08/22 0242 10/09/22 0250 10/10/22 0310  WBC 8.5 6.2 7.8  HGB 8.8* 7.1* 7.0*  HCT 27.0* 20.8* 20.5*  PLT 61* 43* 48*   BMET Recent Labs    10/08/22 0242 10/09/22 0250 10/10/22 0310  NA 139 137 135  K 5.1 4.5 4.1  CL 113* 110 110  CO2 16* 19* 19*  GLUCOSE 81 126* 128*  BUN 25* 22* 18  CREATININE 2.64* 2.32* 2.31*  CALCIUM 8.6* 8.4* 8.7*   LFT Recent Labs    10/08/22 0242 10/09/22 0250 10/10/22 0310  PROT 6.4* 5.6* 5.6*  ALBUMIN 2.8* 2.9* 3.2*  AST 63* 44* 33  ALT 25 18 15   ALKPHOS 118 71 71  BILITOT 2.3* 2.4* 1.6*    PT/INR No results for input(s): "LABPROT", "INR" in the last 72 hours. Hepatitis Panel No results for input(s): "HEPBSAG", "HCVAB", "HEPAIGM", "HEPBIGM" in the last 72 hours.  Studies/Results: No results found.  Scheduled Meds:  sodium chloride   Intravenous Once   sodium chloride   Intravenous Once   feeding supplement  237 mL Oral BID BM   lactulose  30 g Oral BID   lidocaine (PF)  5 mL Intradermal Once   midodrine  20 mg Oral TID WC   multivitamin with minerals  1 tablet Oral Daily   octreotide  150 mcg Subcutaneous BID   pantoprazole (PROTONIX) IV  40 mg Intravenous Q12H   [START ON 10/11/2022] terlipressin  0.85 mg Intravenous Q6H   Continuous Infusions:  cefTRIAXone (ROCEPHIN)  IV 2 g (10/09/22 1555)   PRN Meds:.acetaminophen, oxyCODONE  ASSESMENT:     AKI.   Per renal MD prerenal insults =/- ischemic ATN favored over HRS.  CKD stage 3b.  Improving.  GFR 18.. 26.  Octreotide SQ day 3. Midodrine day 4.   Albumin day 4.  Terlipressin ordered but not clear it was given prior to dc order.  On midodrine outpt.  No diuretics PTA.  IDA, GIB w limited hematemesis > 1 week PTA.    10/08/22 EGD: Large esoph varices wo bleeding stigmata, not treated.  Hemorrhagic appearing GJ anastomosis, treated w monopolar probe and clipped.   11/20 colonoscopy: poor prep, aborted at descending colon.   S/p 1 PRBC.  Hgb 6.1.. 8.8.. 7.  2nd PRBC ordered today.   Rocephin day 4.  Octreotide SQ day 3.  Protonix Iv bid day 3.   Hgb dropped but no obvious GI bleeding or other sources of blood loss.      S/p Roux en Y gastric bypass.  Later revision to address anastomotic ulcers.      ETOH cirrhosis.  Abstinent since 02/2022.   09/29/22 MRI:  Cirrhotic liver, changes of portal htn, splenomegaly, large ascites. 18 x 14 mm L liver nodule likely benign  regenerative nodule.  AFP 9 in late 06/2022.  Per Drazek NP ([facility] provider) phone note 10/06/22: "in order to proceed with transplant evaluation  she would need to have health insurance in place".  Pt informs me that as of Jan 1, she has [employer] coverage (not sure if this is from [facility] or not).     Ascites.  4 liter tap 09/14/22, 1.5 L tap 09/17/22.  3.8 L Tap 10/07/22.  Not following advised 2 L Na diet PTA.  No diuretics PTA.  At 10/02/22 [facility]ology visit [place] L[place] NP noted " not a candidate for TIPS due to poor liver synthetic function". .      Thrombocytopenia, splenomegaly.  Platelets 60s.. 40s.      HE, hx, not acute this admit.  On scheduled Lactulose, was using prn constipation PTA.  Pt tells me w her new [employer] insurance that Rifaximin is now covered and ging to be sent to her house this week.  Drazek RXd this.      Hypotension, chronic.   PLAN     Switched to Protonix 40 po bid.      Per Dr Arty Baumgartner, renal:  continue midodrine.       Given thrombocytopenia, no plans for repeat EGD or colonoscopy at present.      Adding Rifaximin and lowering dose of Lactulose.      Encouraged ambulation.  Advised pt to call [facility] clinic office to provide them w information of her new insurance.      CBC in AM.    Azucena Freed  10/10/2022, 8:26 AM Phone [phone]

## 2022-10-10 NOTE — Discharge Summary (Signed)
Physician Discharge Summary  [name] XFG:[id number] DOB: [birth year] DOA: 10/06/2022  PCP: Kerin Perna, NP  Admit date: 10/06/2022 Discharge date: 10/10/2022  Admitted From: Home Disposition: Home  Recommendations for Outpatient Follow-up:  Follow up with PCP in 1 week with repeat CBC/CMP Outpatient follow-up with GI and nephrology Follow up in ED if symptoms worsen or new appear   Home Health: No Equipment/Devices: None  Discharge Condition: Stable CODE STATUS: Full Diet recommendation: Heart healthy/fluid restriction of up to 1200 cc a day  Brief/Interim Summary: 47 y.o. 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  On presentation, creatinine was 3.16 (creatinine 2.55 less than a month ago) with hemoglobin of 7.7 (9.6 almost a month ago).  ED provider discussed with nephrology who recommended admission and IV albumin.  GI was also consulted.  She underwent EGD on 10/08/2022.  Subsequently her condition has improved.  Her hemoglobin was 7 today for which she received 1 unit packed red cell transfusion and repeat hemoglobin is 8.7.  She is adamant about going home today.  Does not want a repeat EGD prior to discharge.  GI and nephrology have cleared the patient for discharge.  Discharge patient home today with close outpatient follow-up with GI and nephrology.  Discharge Diagnoses:   AKI on CKD stage IIIb Acute metabolic acidosis -Probably prerenal in the setting of diuretics versus hepatorenal syndrome -Presented with creatinine of 3.16 (creatinine 2.55 less than a month ago)  -Received IV albumin on presentation.  Creatinine 2.31 today.  Bicarbonate 19 today as well.   Nephrology following.  Received IV albumin and octreotide as per nephrology.  Also on midodrine as per nephrology. -Nephrology has signed off and recommended outpatient follow-up with nephrology and to continue midodrine on discharge.  Outpatient follow-up of BMP within a week.   -Discharge patient home today.    Decompensated alcoholic cirrhosis with ascites with elevated LFTs and thrombocytopenia with hepatic encephalopathy -Patient is currently completely abstinent of alcohol -Continue lactulose, ammonia 89 on presentation.  Patient apparently could not afford rifaximin recently as an outpatient.  Rifaximin has been restarted by GI today. -Diuretics on hold -Platelets 48 today. -LFTs currently stable.  Outpatient follow-up. -Patient has had recent recurrent paracentesis.  Apparently not a candidate for TIPS as per recent IR evaluation as an outpatient.  Status post ultrasound-guided paracentesis and removal of 3.8 L fluid on 10/07/2022.  Peritoneal fluid analysis not consistent with SBP.  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   Chronic hypertension -Continue midodrine.  Blood pressure on the lower side but stable.   Acute on chronic anemia of chronic disease/macrocytic anemia Possible GI bleeding -Hemoglobin 7.7 on presentation.  Hemoglobin 6.1 on 10/07/2022.  Status post 1 unit packed red cells transfusion.  Monitor H&H.  Hemoglobin 7 today.  Status post 1 unit packed red cell transfusion today as well.  Hemoglobin repeated subsequently is 8.7. -IV Protonix has been changed to oral by GI. -EGD as above: GI contemplating EGD prior to discharge but patient is adamant that she does not want to have EGD.  She has upcoming outpatient hepatology appointment.  GI has cleared  the patient for discharge.  Outpatient  follow-up with GI.  Outpatient follow-up of CBC.  Hypokalemia -Improved   Chronic anxiety and depression -Outpatient follow-up.   GERD -IV PPI has been changed to oral by GI   Moderate malnutrition -Follow nutrition recommendations    Discharge Instructions  Discharge Instructions     Diet - low sodium heart healthy   Complete by: As directed    Increase activity slowly   Complete by: As directed    No wound care   Complete by: As directed       Allergies as of 10/10/2022       Reactions   Tape Rash   Clear tape        Medication List     STOP taking these medications    feeding supplement (NEPRO CARB STEADY) Liqd   nicotine 7 mg/24hr patch Commonly known as: NICODERM CQ - dosed in mg/24 hr       TAKE these medications    folic acid 1 MG tablet Commonly known as: FOLVITE Take 1 tablet (1 mg total) by mouth daily.   lactulose 10 GM/15ML solution Commonly known as: CHRONULAC Take 15 mLs (10 g total) by mouth daily. What changed:  how much to take when to take this reasons to take this   midodrine 10 MG tablet Commonly known as: PROAMATINE Take 2 tablets (20 mg total) by mouth 3 (three) times daily with meals. What changed:  medication strength how much to take   multivitamin Tabs tablet Take 1 tablet by mouth at bedtime.   pantoprazole 40 MG tablet Commonly known as: PROTONIX Take 1 tablet (40 mg total) by mouth 2 (two) times daily.   rifaximin 550 MG Tabs tablet Commonly known as: XIFAXAN Take 1 tablet (550 mg total) by mouth 2 (two) times daily.        Allergies  Allergen Reactions   Tape Rash    Clear tape    Consultations: GI/nephrology   Procedures/Studies: US Paracentesis  Result Date: 10/07/2022 INDICATION: History of cirrhosis. Ascites. Request for diagnostic and therapeutic paracentesis. EXAM: ULTRASOUND GUIDED RIGHT LOWER QUADRANT PARACENTESIS MEDICATIONS: None. COMPLICATIONS: None immediate. PROCEDURE: Informed  written consent was obtained from the patient after a discussion of the risks, benefits and alternatives to treatment. A timeout was performed prior to the initiation of the procedure. Initial ultrasound scanning demonstrates a large amount of ascites within the right lower abdominal quadrant. The right lower abdomen was prepped and draped in the usual sterile fashion. 1% lidocaine was used for local anesthesia. Following this, a 19 gauge, 7-cm, Yueh catheter was introduced. An ultrasound image was saved for documentation purposes. The paracentesis was performed. The catheter was removed and a dressing was applied. The patient tolerated the procedure well without immediate post procedural complication. FINDINGS: A total of approximately 3.8 L of hazy yellow fluid was removed. Samples were sent to the laboratory as requested by the clinical team. IMPRESSION: Successful ultrasound-guided paracentesis yielding 3.8 liters of peritoneal fluid. Read by: Brayton ElKevin Bruning PA-C Electronically Signed   By: Judie PetitM.  Shick M.D.   On: 10/07/2022 12:51   MR LIVER W WO CONTRAST  Result Date: 10/01/2022 CLINICAL DATA:  Cirrhosis, further evaluation of hepatic lesion. EXAM: MRI ABDOMEN WITHOUT AND WITH CONTRAST TECHNIQUE: Multiplanar multisequence MR imaging of the abdomen was performed both before and after the administration of intravenous contrast. CONTRAST:  7.635mL GADAVIST GADOBUTROL 1 MMOL/ML IV SOLN COMPARISON:  CT abdomen pelvis September 29, 2021. FINDINGS: Lower chest:  Low lung volumes. Hepatobiliary: Cirrhotic hepatic morphology. Nodular excrescence extending from the medial left lobe of the liver measures 18 x 14 mm on image 32/13 previously 23 x 22 mm when remeasured for consistency. This nodule demonstrates signal intensity similar to that of background liver on pre and postcontrast pulse sequences. No arterially enhancing hepatic lesions. Gallbladder surgically absent. Prominence of the common bile duct measuring 6 mm favored  reservoir effect post cholecystectomy. Pancreas: No pancreatic ductal dilation or evidence of acute inflammation. Spleen: Splenomegaly measuring 12.4 x 11.5 x 9.7 cm (volume = 720 cm^3). Adrenals/Urinary Tract: Bilateral adrenal glands appear normal. No hydronephrosis. Kidneys demonstrate symmetric enhancement. Stomach/Bowel: Prior gastric bypass. Limited evaluation reveals no acute abnormality. Vascular/Lymphatic: Normal caliber abdominal aorta. Smooth IVC contours. The portal, splenic and superior mesenteric veins are patent. Portosystemic collateral vessels are present. No pathologically enlarged abdominal lymph nodes. Prominent right upper quadrant lymph nodes measure up to 7 mm and are favored reactive. Other:  Large volume ascites. Musculoskeletal: No suspicious bone lesions identified. IMPRESSION: 1. Nodular excrescence extending from the medial left lobe of the liver measures 18 x 14 mm and demonstrates signal intensity similar to that of background liver on pre and postcontrast pulse sequences, corresponding with the abnormality seen on prior CT, is compatible with a benign finding likely reflecting a regenerative nodule. 2. No arterially enhancing hepatic lesion. 3. Cirrhotic hepatic morphology with sequela of portal hypertension including portosystemic collaterals, splenomegaly and large volume ascites. Electronically Signed   By: Maudry MayhewJeffrey  Waltz M.D.   On: 10/01/2022 09:08   VAS US LOWER EXTREMITY VENOUS (DVT)  Result Date: 09/21/2022  Lower Venous DVT Study Patient Name:  Minta BalsamAMISHA R Bolanos  Date of Exam:   09/21/2022 Medical Rec #: [id number]         Accession #:    [id number] Date of Birth: 12-Dec-1975        Patient Gender: F Patient Age:   [age] years [facility] Procedure:      VAS US LOWER EXTREMITY VENOUS (DVT) Referring Phys: Norwood LevoYING DORSEY --------------------------------------------------------------------------------  Indications: Left ankle swelling.  Comparison Study:  06-05-2022 Prior bilateral lower extremity venous was negative                   for DVT. Performing Technologist: Jean Rosenthalachel Hodge RDMS, RVT  Examination Guidelines: A complete evaluation includes B-mode imaging, spectral Doppler, color Doppler, and power Doppler as needed of all accessible portions of each vessel. Bilateral testing is considered an integral part of a complete examination. Limited examinations for reoccurring indications may be performed as noted. The reflux portion of the exam is performed with the patient in reverse Trendelenburg.  +-----+---------------+---------+-----------+----------+--------------+ RIGHTCompressibilityPhasicitySpontaneityPropertiesThrombus Aging +-----+---------------+---------+-----------+----------+--------------+ CFV  Full           Yes      Yes                                 +-----+---------------+---------+-----------+----------+--------------+   +---------+---------------+---------+-----------+----------+--------------+ LEFT     CompressibilityPhasicitySpontaneityPropertiesThrombus Aging +---------+---------------+---------+-----------+----------+--------------+ CFV      Full           Yes      Yes                                 +---------+---------------+---------+-----------+----------+--------------+ SFJ      Full                                                        +---------+---------------+---------+-----------+----------+--------------+  FV Prox  Full                                                        +---------+---------------+---------+-----------+----------+--------------+ FV Mid   Full                                                        +---------+---------------+---------+-----------+----------+--------------+ FV DistalFull                                                        +---------+---------------+---------+-----------+----------+--------------+ PFV      Full                                                         +---------+---------------+---------+-----------+----------+--------------+ POP      Full           Yes      Yes                                 +---------+---------------+---------+-----------+----------+--------------+ PTV      Full                                                        +---------+---------------+---------+-----------+----------+--------------+ PERO     Full                                                        +---------+---------------+---------+-----------+----------+--------------+     Summary: RIGHT: - No evidence of common femoral vein obstruction.  LEFT: - There is no evidence of deep vein thrombosis in the lower extremity.  - No cystic structure found in the popliteal fossa.  *See table(s) above for measurements and observations. Electronically signed by Coral Else MD on 09/21/2022 at 5:21:01 PM.    Final    US Paracentesis  Result Date: 09/17/2022 INDICATION: Cirrhosis, ascites EXAM: ULTRASOUND GUIDED left lower quadrant therapeutic PARACENTESIS MEDICATIONS: 8 cc 1% lidocaine COMPLICATIONS: None immediate. PROCEDURE: Informed written consent was obtained from the patient after a discussion of the risks, benefits and alternatives to treatment. A timeout was performed prior to the initiation of the procedure. Initial ultrasound scanning demonstrates a large amount of ascites within the left lower abdominal quadrant. The left lower abdomen was prepped and draped in the usual sterile fashion. 1% lidocaine was used for local anesthesia. Following this, a 19 gauge, 7-cm, Yueh catheter was introduced. An ultrasound image was saved for documentation purposes. The  paracentesis was performed. The catheter was removed and a dressing was applied. The patient tolerated the procedure well without immediate post procedural complication. FINDINGS: A total of approximately 1.55 L of yellow fluid was removed. Ordering provider did not request  laboratory samples IMPRESSION: Successful ultrasound-guided paracentesis yielding 1.55 liters of peritoneal fluid. PLAN: The patient has required >/=2 paracenteses in a 30 day period and a formal evaluation by the [facility] Interventional Radiology Portal [facility]pert[facility] has been arranged. Read by: Mina Marble, PA-C Electronically Signed   By: Irish Lack M.D.   On: 09/17/2022 10:24   IR Paracentesis  Result Date: 09/14/2022 INDICATION: Cirrhosis, ascites EXAM: ULTRASOUND GUIDED RUQ PARACENTESIS MEDICATIONS: None. COMPLICATIONS: None immediate. PROCEDURE: Informed written consent was obtained from the patient after a discussion of the risks, benefits and alternatives to treatment. A timeout was performed prior to the initiation of the procedure. Initial ultrasound scanning demonstrates a moderate amount of ascites within the right lower abdominal quadrant. The right lower abdomen was prepped and draped in the usual sterile fashion. 1% lidocaine was used for local anesthesia. Following this, a 19 gauge, 7-cm, Yueh catheter was introduced. An ultrasound image was saved for documentation purposes. The paracentesis was performed. The catheter was removed and a dressing was applied. The patient tolerated the procedure well without immediate post procedural complication. FINDINGS: A total of approximately 4L of ascitic fluid was removed. Samples were sent to the laboratory as requested by the clinical team. IMPRESSION: Successful ultrasound-guided paracentesis yielding 4 liters of peritoneal fluid. PLAN: If the patient eventually requires >/=2 paracenteses in a 30 day period, candidacy for formal evaluation by the [facility] Interventional Radiology Portal Hypertension Clinic will be assessed. Electronically Signed   By: Gilmer Mor D.O.   On: 09/14/2022 16:48   US Renal  Result Date: 09/13/2022 CLINICAL DATA:  Acute kidney injury EXAM: RENAL / URINARY TRACT ULTRASOUND COMPLETE COMPARISON:  CT  04/10/2022 FINDINGS: Right Kidney: Renal measurements: 11.2 x 4.4 x 4.7 cm = volume: 121.6 mL. Echogenicity within normal limits. No mass or hydronephrosis visualized. Left Kidney: Renal measurements: 10.7 x 4.8 x 3.9 cm = volume: 104.2 mL. Echogenicity within normal limits. No mass or hydronephrosis visualized. Bladder: Appears distended with particulate debris. Other: Large volume of ascites within the abdomen and pelvis. Cirrhotic morphology of the liver. IMPRESSION: 1. Negative for hydronephrosis. 2. Large volume ascites. 3. Cirrhotic morphology of the liver. Electronically Signed   By: Jasmine Pang M.D.   On: 09/13/2022 23:38      Subjective: Patient seen and examined at bedside.  Feels better and tolerating diet.  Denies chest pain, worsening abdominal pain or vomiting.   Wants to go home today  Discharge Exam: Vitals:   10/10/22 1000 10/10/22 1234  BP: (!) 98/52 96/63  Pulse: 73 87  Resp: 18 15  Temp: 98.6 F (37 C) 98.6 F (37 C)  SpO2: 99% 100%    General: Still on room air.  No distress.  Looks chronically ill and deconditioned ENT/neck: No obvious neck masses or JVD elevation noted respiratory: Decreased breath sounds at bases bilaterally with some crackles CVS: S1-S2 heard; rate mostly controlled Abdominal: Soft, mildly tender, still distended; no organomegaly, normal bowel sounds are heard  extremities: Trace lower extremity edema present; no cyanosis CNS: Awake; answering some questions appropriately.  No focal neurologic deficit.  Moving extremities lymph: No palpable lymphadenopathy noted Skin: No obvious lesions/ecchymosis  psych: Showing no signs of agitation.  Flat affect. Musculoskeletal: No obvious joint erythema/tenderness  The results of significant diagnostics from this hospitalization (including imaging, microbiology, ancillary and laboratory) are listed below for reference.     Microbiology: Recent Results (from the past 240 hour(s))  Gram stain      Status: None   Collection Time: 10/07/22 12:12 PM   Specimen: PATH Cytology Peritoneal fluid  Result Value Ref Range Status   Specimen Description PERITONEAL  Final   Special Requests NONE  Final   Gram Stain   Final    NO ORGANISMS SEEN NO WBC SEEN Performed at [facility], [address] [place], [place] [address]    Report Status 10/07/2022 FINAL  Final  Culture, body fluid w Gram Stain-bottle     Status: None (Preliminary result)   Collection Time: 10/07/22 12:12 PM   Specimen: Peritoneal Washings  Result Value Ref Range Status   Specimen Description PERITONEAL  Final   Special Requests NONE  Final   Culture   Final    NO GROWTH 3 DAYS Performed at [facility], [address] [place], [place] [address]    Report Status PENDING  Incomplete     Labs: BNP (last 3 results) Recent Labs    04/29/22 0306 06/20/22 2219 09/13/22 2223  BNP 967.2* 82.0 128.2*   Basic Metabolic Panel: Recent Labs  Lab 10/07/22 0239 10/07/22 1754 10/08/22 0242 10/09/22 0250 10/10/22 0310  NA 136 137 139 137 135  K 2.7* 4.4 5.1 4.5 4.1  CL 107 108 113* 110 110  CO2 20* 18* 16* 19* 19*  GLUCOSE 102* 89 81 126* 128*  BUN 27* 26* 25* 22* 18  CREATININE 3.09* 2.75* 2.64* 2.32* 2.31*  CALCIUM 8.5* 8.5* 8.6* 8.4* 8.7*  MG 2.4  --  2.3 2.2 2.3  PHOS 3.5  --   --   --   --    Liver Function Tests: Recent Labs  Lab 10/06/22 1821 10/07/22 0239 10/08/22 0242 10/09/22 0250 10/10/22 0310  AST 70* 50* 63* 44* 33  ALT 26 21 25 18 15   ALKPHOS 131* 110 118 71 71  BILITOT 2.0* 1.6* 2.3* 2.4* 1.6*  PROT 7.1 6.0* 6.4* 5.6* 5.6*  ALBUMIN 2.5* 2.9* 2.8* 2.9* 3.2*   Recent Labs  Lab 10/06/22 1821  LIPASE 35   Recent Labs  Lab 10/06/22 2250  AMMONIA 89*   CBC: Recent Labs  Lab 10/06/22 2250 10/07/22 0239 10/07/22 1754 10/08/22 0242 10/09/22 0250 10/10/22 0310 10/10/22 1438  WBC 7.0 5.7  --  8.5 6.2 7.8  --   NEUTROABS  --   --   --  5.4 3.5 4.6  --   HGB 7.2*  6.1* 7.9* 8.8* 7.1* 7.0* 8.7*  HCT 21.7* 17.3* 22.6* 27.0* 20.8* 20.5* 25.3*  MCV 105.3* 101.8*  --  105.1* 101.5* 102.0*  --   PLT 54* 46*  --  61* 43* 48*  --    Cardiac Enzymes: No results for input(s): "CKTOTAL", "CKMB", "CKMBINDEX", "TROPONINI" in the last 168 hours. BNP: Invalid input(s): "POCBNP" CBG: No results for input(s): "GLUCAP" in the last 168 hours. D-Dimer No results for input(s): "DDIMER" in the last 72 hours. Hgb A1c No results for input(s): "HGBA1C" in the last 72 hours. Lipid Profile No results for input(s): "CHOL", "HDL", "LDLCALC", "TRIG", "CHOLHDL", "LDLDIRECT" in the last 72 hours. Thyroid function studies No results for input(s): "TSH", "T4TOTAL", "T3FREE", "THYROIDAB" in the last 72 hours.  Invalid input(s): "FREET3" Anemia work up No results for input(s): "VITAMINB12", "FOLATE", "FERRITIN", "TIBC", "  IRON", "RETICCTPCT" in the last 72 hours. Urinalysis    Component Value Date/Time   COLORURINE AMBER (A) 10/06/2022 2035   APPEARANCEUR HAZY (A) 10/06/2022 2035   LABSPEC 1.021 10/06/2022 2035   PHURINE 5.0 10/06/2022 2035   GLUCOSEU NEGATIVE 10/06/2022 2035   HGBUR NEGATIVE 10/06/2022 2035   BILIRUBINUR NEGATIVE 10/06/2022 2035   KETONESUR NEGATIVE 10/06/2022 2035   PROTEINUR NEGATIVE 10/06/2022 2035   UROBILINOGEN 0.2 01/09/2013 2111   NITRITE NEGATIVE 10/06/2022 2035   LEUKOCYTESUR NEGATIVE 10/06/2022 2035   Sepsis Labs Recent Labs  Lab 10/07/22 0239 10/08/22 0242 10/09/22 0250 10/10/22 0310  WBC 5.7 8.5 6.2 7.8   Microbiology Recent Results (from the past 240 hour(s))  Gram stain     Status: None   Collection Time: 10/07/22 12:12 PM   Specimen: PATH Cytology Peritoneal fluid  Result Value Ref Range Status   Specimen Description PERITONEAL  Final   Special Requests NONE  Final   Gram Stain   Final    NO ORGANISMS SEEN NO WBC SEEN Performed at [facility], [place] [address] [place], [place] [address]    Report Status 10/07/2022  FINAL  Final  Culture, body fluid w Gram Stain-bottle     Status: None (Preliminary result)   Collection Time: 10/07/22 12:12 PM   Specimen: Peritoneal Washings  Result Value Ref Range Status   Specimen Description PERITONEAL  Final   Special Requests NONE  Final   Culture   Final    NO GROWTH 3 DAYS Performed at [place] [address] [place], [place] [address]    Report Status PENDING  Incomplete     Time coordinating discharge: 35 minutes  SIGNED:   Aline August, MD  [facility] 10/10/2022, 3:33 PM

## 2022-10-10 NOTE — TOC Initial Note (Signed)
Transition of Care [facility]) - Initial/Assessment Note    Patient Details  Name: [name] MRN: [id number] Date of Birth: Dec 31, 1975  Transition of Care [facility]) CM/SW Contact:    Marilu Favre, RN Phone Number: 10/10/2022, 1:29 PM  Clinical Narrative:                  Patient from home with brother.   Confirmed face sheet information.   Patient has transportation to appointments and can get medications.   Patient has transportation home at discharge    Transition of Care Department [facility]) has reviewed patient and no TOC needs have been identified at this time. We will continue to monitor patient advancement through interdisciplinary progression rounds. If new patient transition needs arise, please place a TOC consult.   Expected Discharge Plan: Home/Self Care Barriers to Discharge: Continued Medical Work up   Patient Goals and CMS Choice Patient states their goals for this hospitalization and ongoing recovery are:: to return to home          Expected Discharge Plan and Services   Discharge Planning Services: CM Consult   Living arrangements for the past 2 months: Apartment                 DME Arranged: N/A         HH Arranged: NA          Prior Living Arrangements/Services Living arrangements for the past 2 months: Apartment Lives with:: Siblings Patient language and need for interpreter reviewed:: Yes Do you feel safe going back to the place where you live?: Yes      Need for Family Participation in Patient Care: Yes (Comment) Care giver support system in place?: Yes (comment)   Criminal Activity/Legal Involvement Pertinent to Current Situation/Hospitalization: No - Comment as needed  Activities of Daily Living Home Assistive Devices/Equipment: Cane (specify quad or straight), Scales ADL Screening (condition at time of admission) Patient's cognitive ability adequate to safely complete daily activities?: Yes Is the patient deaf or have  difficulty hearing?: No Does the patient have difficulty seeing, even when wearing glasses/contacts?: No Does the patient have difficulty concentrating, remembering, or making decisions?: Yes Patient able to express need for assistance with ADLs?: No Does the patient have difficulty dressing or bathing?: No Independently performs ADLs?: Yes (appropriate for developmental age) Does the patient have difficulty walking or climbing stairs?: No Weakness of Legs: Both Weakness of Arms/Hands: Both  Permission Sought/Granted   Permission granted to share information with : No              Emotional Assessment Appearance:: Appears stated age Attitude/Demeanor/Rapport: Engaged Affect (typically observed): Accepting Orientation: : Oriented to Self, Oriented to Place, Oriented to  Time, Oriented to Situation Alcohol / Substance Use: Not Applicable Psych Involvement: No (comment)  Admission diagnosis:  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HCC) [K76.7] AKI (acute kidney injury) ([place]) [N17.9] Patient Active Problem List   Diagnosis Date Noted   Malnutrition of moderate degree 10/10/2022   Esophageal varices without bleeding ([place]) 10/09/2022   Encephalopathy, hepatic ([place]) 10/09/2022   AKI (acute kidney injury) ([place]) 78/29/5621   Alcoholic cirrhosis of liver with ascites ([place]) 09/14/2022   Ascites due to alcoholic cirrhosis ([place]) 30/86/5784   Hepatorenal failure ([place]) 09/14/2022   Ulcer of left foot with muscle involvement without evidence of necrosis ([place])    Nonhealing ulcer of left lower extremity with necrosis of muscle ([place])    Alcoholic peripheral neuropathy ([place])    Open wound  of left foot 06/20/2022   Acute hyponatremia 06/20/2022   GERD (gastroesophageal reflux disease) 06/20/2022   Essential hypertension 06/20/2022   Tobacco abuse 06/20/2022   Cellulitis 05/03/2022   Rash 04/30/2022   Anemia 04/25/2022   Lactic acidosis 04/19/2022   Hypokalemia 04/19/2022   Stage IV pressure ulcer of  sacral region ([place]) 04/19/2022   Acute respiratory failure with hypoxia ([place]) 04/19/2022   Aspiration pneumonia ([place]) 04/19/2022   Anasarca 04/19/2022   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[place]) 04/19/2022   Marginal ulcer 04/19/2022   Obesity (BMI 30-39.9) 04/19/2022   Acute on chronic anemia 04/17/2022   Hypovolemic shock ([place]) 04/17/2022   Thrombocytopenia ([place]) 52/77/8242   Alcoholic hepatitis 35/36/1443   Gastroenteritis due to norovirus 04/17/2022   Acute kidney injury ([place]) 04/10/2022   Chronic iron deficiency anemia 07/07/2021   Acute cholecystitis due to biliary calculus 11/22/2019   Enlarged lymph nodes in armpit 08/01/2012   PCP:  Kerin Perna, NP Pharmacy:   [id number] [place] [place] [address] [place] [address] Phone: [phone] Fax: [phone]  [place] [address] [place] [address] Phone: [phone] Fax: [place] [address] Phone: [phone] Fax: [phone]  [id number] - [place], [place] AT [address] [place] [address] Phone: [phone] Fax: [phone]     Social Determinants of Health (SDOH) Social History: [place]: No Food Insecurity (10/07/2022)  Housing: Low Risk  (10/07/2022)  Transportation Needs: No Transportation Needs (10/07/2022)  Utilities: Not At Risk (10/07/2022)  Depression (PHQ2-9): Low Risk  (07/19/2022)  Recent Concern: Depression (PHQ2-9) - Medium Risk (06/07/2022)  Tobacco Use: High Risk (10/08/2022)   SDOH Interventions:     Readmission Risk Interventions    06/22/2022    3:14 PM 05/02/2022   11:34 AM  Readmission Risk Prevention Plan  Transportation Screening Complete Complete  Medication Review Press photographer)  Complete Complete  PCP or Specialist appointment within 3-5 days of discharge Complete Complete  HRI or [place] Complete Complete  SW Recovery Care/Counseling Consult Complete Complete  Palliative Care Screening Not Applicable Not [place] Not Applicable Not Applicable

## 2022-10-10 NOTE — Plan of Care (Signed)
  Problem: Education: Goal: Knowledge of General Education information will improve Description Including pain rating scale, medication(s)/side effects and non-pharmacologic comfort measures Outcome: Progressing   Problem: Clinical Measurements: Goal: Ability to maintain clinical measurements within normal limits will improve Outcome: Progressing   Problem: Clinical Measurements: Goal: Will remain free from infection Outcome: Progressing   

## 2022-10-10 NOTE — Progress Notes (Signed)
[name] to be D/C'd  per MD order.  Discussed with the patient and all questions fully answered.  VSS, Skin clean, dry and intact without evidence of skin break down, no evidence of skin tears noted.  IV catheter discontinued intact. Site without signs and symptoms of complications. Dressing and pressure applied.  An After Visit Summary was printed and given to the patient.   D/c re-educated completed with patient/family including follow up instructions, medication list, d/c activities limitations if indicated, with other d/c instructions as indicated by MD - patient able to verbalize understanding, all questions fully answered.   Patient instructed to return to ED, call 911, or call MD for any changes in condition.   Patient to be escorted via [place], and D/C home via private auto.

## 2022-10-10 NOTE — Progress Notes (Signed)
   10/10/22 0430  Provider Notification  Provider Name/Title Quinn Plowman, NP  Date Provider Notified 10/10/22  Time Provider Notified 0430  Method of Notification Page  Notification Reason Critical Result  Test performed and critical result Hgb 7.0  Date Critical Result Received 10/10/22  Time Critical Result Received 0430  Provider response See new orders  Date of Provider Response 10/10/22  Time of Provider Response 0430

## 2022-10-10 NOTE — Progress Notes (Signed)
Patient ID: [name], female   DOB: April 03, 1976, 47 y.o.   MRN: [id number] S: Feeling better and wants to go home. O:BP (!) 93/57   Pulse 80   Temp 99 F (37.2 C) (Oral)   Resp 16   Ht 5\' 6"  (1.676 m)   Wt 78.9 kg   LMP 03/10/2022 (Approximate)   SpO2 100%   BMI 28.08 kg/m   Intake/Output Summary (Last 24 hours) at 10/10/2022 0943 Last data filed at 10/10/2022 0600 Gross per 24 hour  Intake 562.72 ml  Output --  Net 562.72 ml   Intake/Output: I/O last 3 completed shifts: In: 562.7 [P.O.:480; IV Piggyback:82.7] Out: -   Intake/Output this shift:  No intake/output data recorded. Weight change:  Gen: NAD CVS: RRR Resp:CTA Abd: distended, +fluid wave Ext: trace pretibial edema  Recent Labs  Lab 10/06/22 1821 10/06/22 2250 10/07/22 0239 10/07/22 1754 10/08/22 0242 10/09/22 0250 10/10/22 0310  NA 133*  --  136 137 139 137 135  K 3.0*  --  2.7* 4.4 5.1 4.5 4.1  CL 106  --  107 108 113* 110 110  CO2 18*  --  20* 18* 16* 19* 19*  GLUCOSE 101*  --  102* 89 81 126* 128*  BUN 26*  --  27* 26* 25* 22* 18  CREATININE 3.16* 3.19* 3.09* 2.75* 2.64* 2.32* 2.31*  ALBUMIN 2.5*  --  2.9*  --  2.8* 2.9* 3.2*  CALCIUM 8.6*  --  8.5* 8.5* 8.6* 8.4* 8.7*  PHOS  --   --  3.5  --   --   --   --   AST 70*  --  50*  --  63* 44* 33  ALT 26  --  21  --  25 18 15    Liver Function Tests: Recent Labs  Lab 10/08/22 0242 10/09/22 0250 10/10/22 0310  AST 63* 44* 33  ALT 25 18 15   ALKPHOS 118 71 71  BILITOT 2.3* 2.4* 1.6*  PROT 6.4* 5.6* 5.6*  ALBUMIN 2.8* 2.9* 3.2*   Recent Labs  Lab 10/06/22 1821  LIPASE 35   Recent Labs  Lab 10/06/22 2250  AMMONIA 89*   CBC: Recent Labs  Lab 10/06/22 2250 10/07/22 0239 10/07/22 1754 10/08/22 0242 10/09/22 0250 10/10/22 0310  WBC 7.0 5.7  --  8.5 6.2 7.8  NEUTROABS  --   --   --  5.4 3.5 4.6  HGB 7.2* 6.1*   < > 8.8* 7.1* 7.0*  HCT 21.7* 17.3*   < > 27.0* 20.8* 20.5*  MCV 105.3* 101.8*  --  105.1* 101.5* 102.0*  PLT 54*  46*  --  61* 43* 48*   < > = values in this interval not displayed.   Cardiac Enzymes: No results for input(s): "CKTOTAL", "CKMB", "CKMBINDEX", "TROPONINI" in the last 168 hours. CBG: No results for input(s): "GLUCAP" in the last 168 hours.  Iron Studies: No results for input(s): "IRON", "TIBC", "TRANSFERRIN", "FERRITIN" in the last 72 hours. Studies/Results: No results found.  sodium chloride   Intravenous Once   feeding supplement  237 mL Oral BID BM   lactulose  10 g Oral BID   lidocaine (PF)  5 mL Intradermal Once   midodrine  20 mg Oral TID WC   multivitamin with minerals  1 tablet Oral Daily   octreotide  150 mcg Subcutaneous BID   pantoprazole  40 mg Oral BID   rifaximin  550 mg Oral BID   [START ON 10/11/2022]  terlipressin  0.85 mg Intravenous Q6H    BMET    Component Value Date/Time   NA 135 10/10/2022 0310   NA 140 05/16/2022 1633   K 4.1 10/10/2022 0310   CL 110 10/10/2022 0310   CO2 19 (L) 10/10/2022 0310   GLUCOSE 128 (H) 10/10/2022 0310   BUN 18 10/10/2022 0310   BUN 16 05/16/2022 1633   CREATININE 2.31 (H) 10/10/2022 0310   CREATININE 1.33 (H) 07/12/2022 1123   CREATININE 1.21 (H) 07/03/2022 1550   CALCIUM 8.7 (L) 10/10/2022 0310   GFRNONAA 26 (L) 10/10/2022 0310   GFRNONAA 51 (L) 07/12/2022 1123   GFRAA >60 11/24/2019 0314   CBC    Component Value Date/Time   WBC 7.8 10/10/2022 0310   RBC 2.01 (L) 10/10/2022 0310   HGB 7.0 (L) 10/10/2022 0310   HGB 10.7 (L) 07/12/2022 1123   HGB 10.0 (L) 05/16/2022 1633   HCT 20.5 (L) 10/10/2022 0310   HCT 27.7 (L) 05/16/2022 1633   PLT 48 (L) 10/10/2022 0310   PLT 101 (L) 07/12/2022 1123   PLT 135 (L) 05/16/2022 1633   MCV 102.0 (H) 10/10/2022 0310   MCV 94 05/16/2022 1633   MCH 34.8 (H) 10/10/2022 0310   MCHC 34.1 10/10/2022 0310   RDW 21.0 (H) 10/10/2022 0310   RDW 19.0 (H) 05/16/2022 1633   LYMPHSABS 2.0 10/10/2022 0310   LYMPHSABS 1.7 05/16/2022 1633   MONOABS 1.1 (H) 10/10/2022 0310   EOSABS 0.1  10/10/2022 0310   EOSABS 0.1 05/16/2022 1633   BASOSABS 0.1 10/10/2022 0310   BASOSABS 0.0 05/16/2022 1633    Assessment/Plan:   AKI/CKD stage IIIb - baseline Scr 1.4-1.8.  Scr 3.1 on admission and likely due to pre-renal insults +/- ischemic ATN in setting of ABLA.  Initially thought to be related to HRS and given octreotide and midodrine, however clinical course more consistent with ATN due to ABLA and hypotension.  She has also received IV albumin.  Continue midodrine but stop octreotide.  Nothing further to add at this point and will sign off.  Please call with questions or concerns.  She has follow up in our office with Dr. Candiss Norse this week. Decompensated cirrhosis with ascites and abnormal LFT's - continue lactulose, diuretics on hold for now. She had large volume paracentesis on 10/07/22.  Ok to resume diuretics at time of discharge. ABLA - bleeding seen on EGD at surgical anastamosis and treated with monopolar probe and clips.  Follow H/H and transfuse prn.  Hgb dropped again and for 1 unit PRBC's. Hypokalemia - resolved Thrombocytopenia - chronic Disposition - stable for discharge from renal standpoint.   Donetta Potts, MD [facility]

## 2022-10-10 NOTE — Progress Notes (Addendum)
PROGRESS NOTE    [name]  IOE:[id number] DOB: [birth year] DOA: 10/06/2022 PCP: [name], NP   Brief Narrative:  47 y.o. 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  On presentation, creatinine was 3.16 (creatinine 2.55 less than a month ago) with hemoglobin of 7.7 (9.6 almost a month ago).  ED provider discussed with nephrology who recommended admission and IV albumin.  GI was also consulted.  She underwent EGD on 10/08/2022  Assessment & Plan:   AKI on CKD stage IIIb Acute metabolic acidosis -Probably prerenal in the setting of diuretics versus hepatorenal syndrome -Presented with creatinine of 3.16 (creatinine 2.55 less than a month ago)  -Received IV albumin on presentation.  Creatinine 2.31 today.  Bicarbonate 19 today as well.  Nephrology following.  Received IV albumin and octreotide as per nephrology.  Also on midodrine as per nephrology. -Strict input and output.  Daily weights.  Monitor renal function.  Decompensated alcoholic cirrhosis with ascites with elevated LFTs and thrombocytopenia with hepatic encephalopathy -Patient is currently completely abstinent of alcohol -Continue lactulose, ammonia 89 on presentation.  Patient apparently could not afford rifaximin recently as an outpatient.  Rifaximin has been restarted by GI today. -Diuretics on hold -Platelets 48 today. -LFTs currently stable.  Monitor -Patient has had recent recurrent paracentesis.  Apparently not a candidate for TIPS as per recent IR evaluation as an outpatient.  Status post ultrasound-guided paracentesis and removal of 3.8 L fluid on 10/07/2022.  Peritoneal fluid analysis not consistent with  SBP.  Continue Rocephin -GI following: Status post EGD on 10/08/2022 which showed esophageal varices along with hemorrhagic appearance of gastrojejunal anastomosis Roux-en-Y gastrojejunostomy treated with monopolar probe and clips.  Chronic hypertension -Continue midodrine.  Blood pressure on the lower side but stable.  Acute on chronic anemia of chronic disease/macrocytic anemia Possible GI bleeding -Hemoglobin 7.7 on presentation.  Hemoglobin 6.1 on 10/07/2022.  Status post 1 unit packed red cells transfusion.  Monitor H&H.  Hemoglobin 7 today.  Transfuse 1 unit packed red cells.  Repeat CBC in AM. -IV Protonix has been changed to oral by GI. -EGD as above: GI to decide if patient needs repeat EGD prior to discharge  Hypokalemia -Improved  Chronic anxiety and depression -Resume home medications if mental status remains stable  Iron deficiency anemia -Resume ferrous sulfate once able to take orally  GERD -IV PPI has been changed to oral by GI  Moderate malnutrition -Follow nutrition recommendations  DVT prophylaxis: SCDs.   Code Status: Full Family Communication: None at bedside Disposition Plan: Status is: Inpatient Remains inpatient appropriate because: Of severity of illness  Consultants: GI/nephrology  Procedures: Ultrasound-guided paracentesis on 10/07/2022 EGD: 10/08/2022 Impression:               - Large (> 5 mm) esophageal varices.                           - Roux-en-Y gastrojejunostomy with gastrojejunal                            anastomosis characterized by a hemorrhagic  appearance. Treated with a monopolar probe. Clip                            manufacturer: [employer]. Clips (MR safe)                            were placed.                           - Normal examined jejunum.                           - No specimens collected. Recommendation:           - Return patient to hospital ward for ongoing care.                            - Clear liquid diet. If HGB is stable tomorrow her                            diet can be advanced back to the 2 gram sodium diet.                           - Continue present medications.                           - Continue to treat for HRS.  Antimicrobials: Rocephin from 10/07/2022 onwards  Subjective: Patient seen and examined at bedside.  Feels better and tolerating diet.  Denies chest pain, worsening abdominal pain or vomiting.   Objective: Vitals:   10/10/22 0636 10/10/22 0816 10/10/22 0916 10/10/22 1000  BP: (!) 98/58 (!) 96/51 (!) 93/57 (!) 98/52  Pulse: 74 83 80 73  Resp: 16 17 16 18   Temp: 99.1 F (37.3 C) 99.4 F (37.4 C) 99 F (37.2 C) 98.6 F (37 C)  TempSrc: Oral Oral Oral Oral  SpO2: 97% 96% 100% 99%  Weight:      Height:        Intake/Output Summary (Last 24 hours) at 10/10/2022 1037 Last data filed at 10/10/2022 0600 Gross per 24 hour  Intake 562.72 ml  Output --  Net 562.72 ml    Filed Weights   10/07/22 0117 10/08/22 0500 10/08/22 1130  Weight: 78.6 kg 78.9 kg 78.9 kg    Examination:  General: Still on room air.  No distress.  Looks chronically ill and deconditioned ENT/neck: No obvious neck masses or JVD elevation noted respiratory: Decreased breath sounds at bases bilaterally with some crackles CVS: S1-S2 heard; rate mostly controlled Abdominal: Soft, mildly tender, still distended; no organomegaly, normal bowel sounds are heard  extremities: Trace lower extremity edema present; no cyanosis CNS: Awake; answering some questions appropriately.  No focal neurologic deficit.  Moving extremities lymph: No palpable lymphadenopathy noted Skin: No obvious lesions/ecchymosis  psych: Showing no signs of agitation.  Flat affect. Musculoskeletal: No obvious joint erythema/tenderness   Data Reviewed: I have personally reviewed following labs and imaging studies  CBC: Recent Labs  Lab 10/06/22 2250 10/07/22 0239 10/07/22 1754 10/08/22 0242  10/09/22 0250 10/10/22 0310  WBC 7.0 5.7  --  8.5 6.2 7.8  NEUTROABS  --   --   --  5.4 3.5 4.6  HGB 7.2* 6.1* 7.9* 8.8* 7.1* 7.0*  HCT 21.7* 17.3* 22.6* 27.0* 20.8* 20.5*  MCV 105.3* 101.8*  --  105.1* 101.5* 102.0*  PLT 54* 46*  --  61* 43* 48*    Basic Metabolic Panel: Recent Labs  Lab 10/07/22 0239 10/07/22 1754 10/08/22 0242 10/09/22 0250 10/10/22 0310  NA 136 137 139 137 135  K 2.7* 4.4 5.1 4.5 4.1  CL 107 108 113* 110 110  CO2 20* 18* 16* 19* 19*  GLUCOSE 102* 89 81 126* 128*  BUN 27* 26* 25* 22* 18  CREATININE 3.09* 2.75* 2.64* 2.32* 2.31*  CALCIUM 8.5* 8.5* 8.6* 8.4* 8.7*  MG 2.4  --  2.3 2.2 2.3  PHOS 3.5  --   --   --   --     GFR: Estimated Creatinine Clearance: 32.6 mL/min (A) (by C-G formula based on SCr of 2.31 mg/dL (H)). Liver Function Tests: Recent Labs  Lab 10/06/22 1821 10/07/22 0239 10/08/22 0242 10/09/22 0250 10/10/22 0310  AST 70* 50* 63* 44* 33  ALT 26 21 25 18 15   ALKPHOS 131* 110 118 71 71  BILITOT 2.0* 1.6* 2.3* 2.4* 1.6*  PROT 7.1 6.0* 6.4* 5.6* 5.6*  ALBUMIN 2.5* 2.9* 2.8* 2.9* 3.2*    Recent Labs  Lab 10/06/22 1821  LIPASE 35    Recent Labs  Lab 10/06/22 2250  AMMONIA 89*    Coagulation Profile: Recent Labs  Lab 10/06/22 2250  INR 1.5*    Cardiac Enzymes: No results for input(s): "CKTOTAL", "CKMB", "CKMBINDEX", "TROPONINI" in the last 168 hours. BNP (last 3 results) No results for input(s): "PROBNP" in the last 8760 hours. HbA1C: No results for input(s): "HGBA1C" in the last 72 hours. CBG: No results for input(s): "GLUCAP" in the last 168 hours. Lipid Profile: No results for input(s): "CHOL", "HDL", "LDLCALC", "TRIG", "CHOLHDL", "LDLDIRECT" in the last 72 hours. Thyroid Function Tests: No results for input(s): "TSH", "T4TOTAL", "FREET4", "T3FREE", "THYROIDAB" in the last 72 hours. Anemia Panel: No results for input(s): "VITAMINB12", "FOLATE", "FERRITIN", "TIBC", "IRON", "RETICCTPCT" in the last 72  hours. Sepsis Labs: Recent Labs  Lab 10/06/22 1821 10/06/22 2250  LATICACIDVEN 2.4* 2.5*     Recent Results (from the past 240 hour(s))  Gram stain     Status: None   Collection Time: 10/07/22 12:12 PM   Specimen: PATH Cytology Peritoneal fluid  Result Value Ref Range Status   Specimen Description PERITONEAL  Final   Special Requests NONE  Final   Gram Stain   Final    NO ORGANISMS SEEN NO WBC SEEN Performed at [facility], [address], [place], [place]    Report Status 10/07/2022 FINAL  Final  Culture, body fluid w Gram Stain-bottle     Status: None (Preliminary result)   Collection Time: 10/07/22 12:12 PM   Specimen: Peritoneal Washings  Result Value Ref Range Status   Specimen Description PERITONEAL  Final   Special Requests NONE  Final   Culture   Final    NO GROWTH 2 DAYS Performed at [facility], [address], [place], [place]    Report Status PENDING  Incomplete         Radiology Studies: No results found.      Scheduled Meds:  sodium chloride   Intravenous Once   feeding supplement  237 mL Oral BID BM   lactulose  10 g Oral BID   lidocaine (PF)  5  mL Intradermal Once   midodrine  20 mg Oral TID WC   multivitamin with minerals  1 tablet Oral Daily   pantoprazole  40 mg Oral BID   rifaximin  550 mg Oral BID   Continuous Infusions:  cefTRIAXone (ROCEPHIN)  IV 2 g (10/09/22 1555)          Aline August, MD [facility] 10/10/2022, 10:37 AM

## 2022-10-11 ENCOUNTER — Telehealth (INDEPENDENT_AMBULATORY_CARE_PROVIDER_SITE_OTHER): Payer: Self-pay

## 2022-10-11 ENCOUNTER — Encounter (HOSPITAL_COMMUNITY): Payer: Self-pay | Admitting: Gastroenterology

## 2022-10-11 ENCOUNTER — Telehealth: Payer: Self-pay

## 2022-10-11 LAB — TYPE AND SCREEN
ABO/RH(D): B POS
Antibody Screen: NEGATIVE
Unit division: 0

## 2022-10-11 LAB — BPAM RBC
Blood Product Expiration Date: 202401282359
ISSUE DATE / TIME: 202401160934
Unit Type and Rh: 7300

## 2022-10-11 NOTE — Telephone Encounter (Signed)
Transition Care Management Follow-up Telephone Call Date of discharge and from where: 10/10/2022, [facility]  How have you been since you were released from the hospital? She is feeling better and was able to sleep last night Any questions or concerns? No  Items Reviewed: Did the pt receive and understand the discharge instructions provided? Yes  Medications obtained and verified?  She has all medications except the rifaximin and she explained that [facility] is sending that to her.  The cost was prohibitive for her but she said [facility] is getting it for her for free. It is supposed to be delivered today or tomorrow.  Other? No  Any new allergies since your discharge? No  Dietary orders reviewed? Yes and she is trying to adhere to the 1200 cc/day fluid restriction. She also said that she is supposed to be seeing a dietician.  Do you have support at home? Yes   Home Care and Equipment/Supplies: Were home health services ordered? no If so, what is the name of the agency? N/a  Has the agency set up a time to come to the patient's home? not applicable Were any new equipment or medical supplies ordered?  No What is the name of the medical supply agency? N/a Were you able to get the supplies/equipment? not applicable Do you have any questions related to the use of the equipment or supplies? No  Functional Questionnaire: (I = Independent and D = Dependent) ADLs: independent, has cane to use if needed   Follow up appointments reviewed:  PCP Hospital f/u appt confirmed? Yes  Scheduled to see Juluis Mire, NP - 10/24/2022. I could schedule her to be seen sooner but she only wanted an afternoon appointment.  [facility] f/u appt confirmed?  She needs to call her nephrologist to schedule her follow up appointment    Are transportation arrangements needed? No  If their condition worsens, is the pt aware to call PCP or go to the Emergency Dept.? Yes Was the patient provided with  contact information for the PCP's office or ED? Yes Was to pt encouraged to call back with questions or concerns? Yes

## 2022-10-11 NOTE — Telephone Encounter (Signed)
Transition Care Management Follow-up Telephone Call Date of discharge and from where: [facility] 10/10/2022 How have you been since you were released from the hospital? better Any questions or concerns? No  Items Reviewed: Did the pt receive and understand the discharge instructions provided? Yes  Medications obtained and verified? Yes  Other? No  Any new allergies since your discharge? No  Dietary orders reviewed? Yes Do you have support at home? Yes   Home Care and Equipment/Supplies: Were home health services ordered? no If so, what is the name of the agency? N/a  Has the agency set up a time to come to the patient's home? no Were any new equipment or medical supplies ordered?  No What is the name of the medical supply agency? N/a Were you able to get the supplies/equipment? no Do you have any questions related to the use of the equipment or supplies? no  Functional Questionnaire: (I = Independent and D = Dependent) ADLs: I  Bathing/Dressing- I  Meal Prep- I  Eating- I  Maintaining continence- I  Transferring/Ambulation- I  Managing Meds- I  Follow up appointments reviewed:  PCP Hospital f/u appt confirmed? Yes  Scheduled to see Juluis Mire on 10/24/2022 @ 4:10. [facility] Hospital f/u appt confirmed? Yes  Scheduled to see Transplant clinic on 10/2022 Are transportation arrangements needed? No  If their condition worsens, is the pt aware to call PCP or go to the Emergency Dept.? Yes Was the patient provided with contact information for the PCP's office or ED? Yes Was to pt encouraged to call back with questions or concerns? Yes  .lh

## 2022-10-12 ENCOUNTER — Other Ambulatory Visit: Payer: Self-pay | Admitting: Internal Medicine

## 2022-10-12 ENCOUNTER — Other Ambulatory Visit: Payer: Self-pay | Admitting: Nurse Practitioner

## 2022-10-12 DIAGNOSIS — Z1231 Encounter for screening mammogram for malignant neoplasm of breast: Secondary | ICD-10-CM

## 2022-10-12 LAB — CULTURE, BODY FLUID W GRAM STAIN -BOTTLE: Culture: NO GROWTH

## 2022-10-20 ENCOUNTER — Ambulatory Visit: Payer: BC Managed Care – PPO

## 2022-10-23 ENCOUNTER — Ambulatory Visit: Payer: BC Managed Care – PPO

## 2022-10-24 ENCOUNTER — Ambulatory Visit
Admission: RE | Admit: 2022-10-24 | Discharge: 2022-10-24 | Disposition: A | Discharge status: Home / Self Care | Payer: BC Managed Care – PPO | Source: Ambulatory Visit | Attending: Nurse Practitioner | Admitting: Nurse Practitioner

## 2022-10-24 ENCOUNTER — Ambulatory Visit (INDEPENDENT_AMBULATORY_CARE_PROVIDER_SITE_OTHER): Payer: BC Managed Care – PPO | Admitting: Primary Care

## 2022-10-24 ENCOUNTER — Telehealth: Payer: Self-pay | Admitting: Gastroenterology

## 2022-10-24 ENCOUNTER — Encounter (INDEPENDENT_AMBULATORY_CARE_PROVIDER_SITE_OTHER): Payer: Self-pay | Admitting: Primary Care

## 2022-10-24 VITALS — BP 112/70 | HR 70 | Resp 16 | Ht 66.0 in | Wt 184.0 lb

## 2022-10-24 DIAGNOSIS — K7031 Alcoholic cirrhosis of liver with ascites: Secondary | ICD-10-CM | POA: Diagnosis not present

## 2022-10-24 DIAGNOSIS — Z09 Encounter for follow-up examination after completed treatment for conditions other than malignant neoplasm: Secondary | ICD-10-CM

## 2022-10-24 DIAGNOSIS — Z1231 Encounter for screening mammogram for malignant neoplasm of breast: Secondary | ICD-10-CM

## 2022-10-24 NOTE — Telephone Encounter (Signed)
Received call from patient's PCP today.   This is a Dr. Lyndel Safe patient. Patient seen in follow-up from recent hospitalization. Review of the GI notes suggest that the patient has been dealing with concern for GI bleeding and has been a high risk patient for potential TIPS.  Looks like [facility] consideration of transplant evaluation. Not clear that that has occurred as of yet. Patient in follow-up with PCP noted to have increasing abdominal discomfort, increasing weight (greater than 10 pounds since discharge), and increasing shortness of breath. With this being said, a order for paracentesis was placed by PCP. I have asked her to obtain a fluid cell count as well as a fluid culture to ensure that she has not developed SBP. She needs to have a large-volume paracentesis (with increased kidney dysfunction recently would recommend no more than 5 L being removed). Would also recommend patient receive 25 g of albumin as per protocol to minimize risk of significant fluid shifts. I will have Dr. Steve Rattler nurse follow-up with interventional radiology on trying to get this scheduled as able but also to place the albumin order.  If a new order needs to be placed, he will be able to do that as well.  I will let Dr. Lyndel Safe know about his patient as well should any additional workup be required. The patient has a CBC as well as CMP that is pending from PCP office and those lab results will be forwarded to GI if there is significant abnormality.  Lori Britain, MD [facility] Office # [phone]

## 2022-10-24 NOTE — Progress Notes (Signed)
[facility]   Subjective:   [name] is a 47 y.o. female presents for hospital follow up. Admit date to the hospital was 10/06/22, patient was discharged from the hospital on 10/10/22, patient was admitted for: Alcoholic cirrhosis of liver with ascites ([place]), AKI (acute kidney injury) ([place]), Esophageal varices without bleeding ([place]), Encephalopathy, hepatic (HCC) And malnutrition of moderate degree.  Today she presents in acute distress she has shortness of breath, gain 10 to 11 pounds since hospital discharge and her abdomen is distended and very tender with palpation.  Gastroenterologist is Dr. Lyndel Safe on-call for him was Mansouraty, Telford Nab., MD.  Explained to him about the need that she had paracentesis placing order for IR paracentesis.  Dr. Rush Landmark gave me the correct orders to be placed and would inform Dr. Lyndel Safe and he is nurse Annie Main to arrange follow-up.  Past Medical History:  Diagnosis Date   Acid reflux    Alcohol dependence in remission (HCC)    Anemia    Renal disorder    Ulcer      Allergies  Allergen Reactions   Tape Rash    Clear tape      Current Outpatient Medications on File Prior to Visit  Medication Sig Dispense Refill   folic acid (FOLVITE) 1 MG tablet Take 1 tablet (1 mg total) by mouth daily. 30 tablet 6   lactulose (CHRONULAC) 10 GM/15ML solution Take 15 mLs (10 g total) by mouth daily. 237 mL 1   midodrine (PROAMATINE) 10 MG tablet Take 2 tablets (20 mg total) by mouth 3 (three) times daily with meals. 180 tablet 0   pantoprazole (PROTONIX) 40 MG tablet Take 1 tablet (40 mg total) by mouth 2 (two) times daily.     rifaximin (XIFAXAN) 550 MG TABS tablet Take 1 tablet (550 mg total) by mouth 2 (two) times daily.     No current facility-administered medications on file prior to visit.   Review of System: Comprehensive ROS Pertinent positive and negative noted in HPI   Objective:  Blood Pressure 112/70   Pulse 70   Respiration  16   Height 5\' 6"  (1.676 m)   Weight 184 lb (83.5 kg)   Oxygen Saturation 100%   Body Mass Index 29.70 kg/m   Filed Weights   10/24/22 1626  Weight: 184 lb (83.5 kg)    Physical Exam: General Appearance: Well nourished, in no apparent distress. Eyes: PERRLA, EOMs, conjunctiva no swelling or erythema Sinuses: No Frontal/maxillary tenderness ENT/Mouth: Ext aud canals clear, TMs without erythema, bulging. No erythema, swelling, or exudate on post pharynx.  Tonsils not swollen or erythematous. Hearing normal.  Neck: Supple, thyroid normal.  Respiratory: Respiratory effort normal, BS equal bilaterally without rales, rhonchi, wheezing or stridor.  Cardio: RRR with no MRGs. Brisk peripheral pulses without edema.  Abdomen: Soft, + BS.  Non tender, no guarding, rebound, hernias, masses. Lymphatics: Non tender without lymphadenopathy.  Musculoskeletal: Full ROM, 5/5 strength, normal gait.  Skin: Warm, dry without rashes, lesions, ecchymosis.  Neuro: Cranial nerves intact. Normal muscle tone, no cerebellar symptoms. Sensation intact.  Psych: Awake and oriented X 3, normal affect, Insight and Judgment appropriate.    Assessment:   [name] was seen today for hospitalization follow-up.  Diagnoses and all orders for this visit:  Hospital discharge follow-up -     CBC with Differential -     CMP14+EGFR  Ascites due to alcoholic cirrhosis ([place]) -     Cancel: IR Paracentesis; Future -  IR Paracentesis; Future Orders given by Dr.  Rush Landmark to obtain a fluid cell count as well as a fluid culture to ensure that she has not developed SBP. She needs to have a large-volume paracentesis (with increased kidney dysfunction recently would recommend no more than 5 L being removed). Would also recommend patient receive 25 g of albumin as per protocol to minimize risk of significant fluid shifts.To obtain a fluid cell count as well as a fluid culture  Orders placed   This note has been created  with Surveyor, quantity. Any transcriptional errors are unintentional.   Kerin Perna, NP 10/24/2022, 4:40 PM

## 2022-10-25 ENCOUNTER — Other Ambulatory Visit: Payer: Self-pay

## 2022-10-25 ENCOUNTER — Other Ambulatory Visit (INDEPENDENT_AMBULATORY_CARE_PROVIDER_SITE_OTHER): Payer: Self-pay | Admitting: Primary Care

## 2022-10-25 ENCOUNTER — Telehealth: Payer: Self-pay | Admitting: Emergency Medicine

## 2022-10-25 ENCOUNTER — Encounter (HOSPITAL_BASED_OUTPATIENT_CLINIC_OR_DEPARTMENT_OTHER): Payer: BC Managed Care – PPO | Admitting: General Surgery

## 2022-10-25 DIAGNOSIS — K7031 Alcoholic cirrhosis of liver with ascites: Secondary | ICD-10-CM

## 2022-10-25 DIAGNOSIS — K7030 Alcoholic cirrhosis of liver without ascites: Secondary | ICD-10-CM

## 2022-10-25 LAB — CBC WITH DIFFERENTIAL/PLATELET

## 2022-10-25 NOTE — Telephone Encounter (Signed)
Remo Lipps, Thanks for the update and helping arrange this for the patient. GM

## 2022-10-25 NOTE — Telephone Encounter (Signed)
Looked in pt appt desk and pt has been contacted and scheduled for IR paracentesis

## 2022-10-25 NOTE — Telephone Encounter (Signed)
Copied from [place] [id number]. Topic: General - Other >> Oct 25, 2022  1:54 PM Ja-Kwan M wrote: Reason for CRM: Pt reports that she was told that the office that she would be contacted yesterday so the fluid could be drained off her stomach but no one contacted her. Pt also stated that no one contacted her today either. Pt requesting call back

## 2022-10-25 NOTE — Telephone Encounter (Signed)
Pt chart was reviewed.  Noted that pt had not been scheduled for paracentesis. Order reviewed placed by  previously provider did not request albumin nor fluid cell count as well as a fluid culture   Pt was ordered and scheduled for paracentesis with albumin on 10/26/2022 at [facility] at 1:00 PM. Pt to arrive at 12:30 PM at Entrance C. Pt made aware. Pt stated that she see's [facility] next Monday, Tuesday, and Wednesday. Pt verbalized understanding with all questions answered.

## 2022-10-26 ENCOUNTER — Ambulatory Visit (HOSPITAL_COMMUNITY)
Admission: RE | Admit: 2022-10-26 | Discharge: 2022-10-26 | Disposition: A | Discharge status: Home / Self Care | Payer: BC Managed Care – PPO | Source: Ambulatory Visit | Attending: Gastroenterology | Admitting: Gastroenterology

## 2022-10-26 DIAGNOSIS — K7031 Alcoholic cirrhosis of liver with ascites: Secondary | ICD-10-CM | POA: Insufficient documentation

## 2022-10-26 DIAGNOSIS — K7030 Alcoholic cirrhosis of liver without ascites: Secondary | ICD-10-CM

## 2022-10-26 DIAGNOSIS — R188 Other ascites: Secondary | ICD-10-CM | POA: Diagnosis not present

## 2022-10-26 DIAGNOSIS — K7460 Unspecified cirrhosis of liver: Secondary | ICD-10-CM | POA: Diagnosis not present

## 2022-10-26 HISTORY — PX: IR PARACENTESIS: IMG2679

## 2022-10-26 LAB — CMP14+EGFR
ALT: 24 IU/L (ref 0–32)
AST: 53 IU/L — ABNORMAL HIGH (ref 0–40)
Albumin/Globulin Ratio: 0.7 — ABNORMAL LOW (ref 1.2–2.2)
Albumin: 3 g/dL — ABNORMAL LOW (ref 3.9–4.9)
Alkaline Phosphatase: 147 IU/L — ABNORMAL HIGH (ref 44–121)
BUN/Creatinine Ratio: 12 (ref 9–23)
BUN: 48 mg/dL — ABNORMAL HIGH (ref 6–24)
Bilirubin Total: 2.4 mg/dL — ABNORMAL HIGH (ref 0.0–1.2)
CO2: 13 mmol/L — ABNORMAL LOW (ref 20–29)
Calcium: 8.8 mg/dL (ref 8.7–10.2)
Chloride: 107 mmol/L — ABNORMAL HIGH (ref 96–106)
Creatinine, Ser: 4.11 mg/dL — ABNORMAL HIGH (ref 0.57–1.00)
Globulin, Total: 4.5 g/dL (ref 1.5–4.5)
Glucose: 100 mg/dL — ABNORMAL HIGH (ref 70–99)
Potassium: 3.7 mmol/L (ref 3.5–5.2)
Sodium: 137 mmol/L (ref 134–144)
Total Protein: 7.5 g/dL (ref 6.0–8.5)
eGFR: 13 mL/min/{1.73_m2} — ABNORMAL LOW (ref >59)

## 2022-10-26 LAB — CBC WITH DIFFERENTIAL/PLATELET
Basophils Absolute: 0.1 10*3/uL (ref 0.0–0.2)
Basos: 1 %
EOS (ABSOLUTE): 0 10*3/uL (ref 0.0–0.4)
Eos: 1 %
Hematocrit: 23.7 % — ABNORMAL LOW (ref 34.0–46.6)
Hemoglobin: 9 g/dL — ABNORMAL LOW (ref 11.1–15.9)
Immature Grans (Abs): 0 10*3/uL (ref 0.0–0.1)
Immature Granulocytes: 0 %
Lymphocytes Absolute: 1.7 10*3/uL (ref 0.7–3.1)
Lymphs: 27 %
MCH: 33.5 pg — ABNORMAL HIGH (ref 26.6–33.0)
MCHC: 38 g/dL — ABNORMAL HIGH (ref 31.5–35.7)
MCV: 88 fL (ref 79–97)
Monocytes Absolute: 1 10*3/uL — ABNORMAL HIGH (ref 0.1–0.9)
Monocytes: 15 %
Neutrophils Absolute: 3.5 10*3/uL (ref 1.4–7.0)
Neutrophils: 56 %
Platelets: 55 10*3/uL — CL (ref 150–450)
RBC: 2.69 x10E6/uL — CL (ref 3.77–5.28)
RDW: 16.9 % — ABNORMAL HIGH (ref 11.7–15.4)
WBC: 6.4 10*3/uL (ref 3.4–10.8)

## 2022-10-26 LAB — BODY FLUID CELL COUNT WITH DIFFERENTIAL
Eos, Fluid: 0 %
Lymphs, Fluid: 21 %
Monocyte-Macrophage-Serous Fluid: 73 % (ref 50–90)
Neutrophil Count, Fluid: 6 % (ref 0–25)
Total Nucleated Cell Count, Fluid: 158 cu mm (ref 0–1000)

## 2022-10-26 LAB — GRAM STAIN

## 2022-10-26 MED ORDER — LIDOCAINE HCL 1 % IJ SOLN
INTRAMUSCULAR | Status: AC
  Administered 2022-10-26: 8 mL via INTRADERMAL
  Filled 2022-10-26: qty 20

## 2022-10-26 MED ORDER — ALBUMIN HUMAN 25 % IV SOLN: 25.0000 g | Freq: Once | INTRAVENOUS | Status: AC

## 2022-10-26 MED ORDER — ALBUMIN HUMAN 25 % IV SOLN
INTRAVENOUS | Status: AC
  Administered 2022-10-26: 25 g via INTRAVENOUS
  Filled 2022-10-26: qty 100

## 2022-10-26 NOTE — Procedures (Signed)
PROCEDURE SUMMARY:  Successful ultrasound guided paracentesis from the left lower quadrant.  Yielded 5.6 L of clear yellow fluid.  No immediate complications.  The patient tolerated the procedure well.   Specimen was sent for labs.  EBL < 39mL  Patient currently being assessed for liver transplant at outside hospital. Will arrange [facility] assessment if patient is unable to undergo transplant.  Candiss Norse, PA-C

## 2022-10-27 ENCOUNTER — Other Ambulatory Visit: Payer: Self-pay | Admitting: Nurse Practitioner

## 2022-10-27 DIAGNOSIS — R928 Other abnormal and inconclusive findings on diagnostic imaging of breast: Secondary | ICD-10-CM

## 2022-10-30 ENCOUNTER — Encounter: Payer: Self-pay | Admitting: Gastroenterology

## 2022-10-30 DIAGNOSIS — N179 Acute kidney failure, unspecified: Secondary | ICD-10-CM | POA: Diagnosis not present

## 2022-10-30 DIAGNOSIS — F4321 Adjustment disorder with depressed mood: Secondary | ICD-10-CM | POA: Diagnosis not present

## 2022-10-30 DIAGNOSIS — F4320 Adjustment disorder, unspecified: Secondary | ICD-10-CM | POA: Diagnosis not present

## 2022-10-30 DIAGNOSIS — I361 Nonrheumatic tricuspid (valve) insufficiency: Secondary | ICD-10-CM | POA: Diagnosis not present

## 2022-10-30 DIAGNOSIS — D649 Anemia, unspecified: Secondary | ICD-10-CM | POA: Diagnosis not present

## 2022-10-30 DIAGNOSIS — I120 Hypertensive chronic kidney disease with stage 5 chronic kidney disease or end stage renal disease: Secondary | ICD-10-CM | POA: Diagnosis not present

## 2022-10-30 DIAGNOSIS — L89153 Pressure ulcer of sacral region, stage 3: Secondary | ICD-10-CM | POA: Diagnosis not present

## 2022-10-30 DIAGNOSIS — R7989 Other specified abnormal findings of blood chemistry: Secondary | ICD-10-CM | POA: Diagnosis not present

## 2022-10-30 DIAGNOSIS — E041 Nontoxic single thyroid nodule: Secondary | ICD-10-CM | POA: Diagnosis not present

## 2022-10-30 DIAGNOSIS — F1021 Alcohol dependence, in remission: Secondary | ICD-10-CM | POA: Diagnosis not present

## 2022-10-30 DIAGNOSIS — J9811 Atelectasis: Secondary | ICD-10-CM | POA: Diagnosis not present

## 2022-10-30 DIAGNOSIS — F32A Depression, unspecified: Secondary | ICD-10-CM | POA: Diagnosis not present

## 2022-10-30 DIAGNOSIS — Z01818 Encounter for other preprocedural examination: Secondary | ICD-10-CM | POA: Diagnosis not present

## 2022-10-30 DIAGNOSIS — R0602 Shortness of breath: Secondary | ICD-10-CM | POA: Diagnosis not present

## 2022-10-30 DIAGNOSIS — R1084 Generalized abdominal pain: Secondary | ICD-10-CM | POA: Diagnosis not present

## 2022-10-30 DIAGNOSIS — K7031 Alcoholic cirrhosis of liver with ascites: Secondary | ICD-10-CM | POA: Diagnosis not present

## 2022-10-30 DIAGNOSIS — D696 Thrombocytopenia, unspecified: Secondary | ICD-10-CM | POA: Diagnosis not present

## 2022-10-30 DIAGNOSIS — R928 Other abnormal and inconclusive findings on diagnostic imaging of breast: Secondary | ICD-10-CM | POA: Diagnosis not present

## 2022-10-30 DIAGNOSIS — Z992 Dependence on renal dialysis: Secondary | ICD-10-CM | POA: Diagnosis not present

## 2022-10-30 DIAGNOSIS — K7460 Unspecified cirrhosis of liver: Secondary | ICD-10-CM | POA: Diagnosis not present

## 2022-10-30 DIAGNOSIS — K7210 Chronic hepatic failure without coma: Secondary | ICD-10-CM | POA: Diagnosis not present

## 2022-10-30 DIAGNOSIS — N6459 Other signs and symptoms in breast: Secondary | ICD-10-CM | POA: Diagnosis not present

## 2022-10-30 DIAGNOSIS — Z01419 Encounter for gynecological examination (general) (routine) without abnormal findings: Secondary | ICD-10-CM | POA: Diagnosis not present

## 2022-10-30 DIAGNOSIS — N184 Chronic kidney disease, stage 4 (severe): Secondary | ICD-10-CM | POA: Diagnosis not present

## 2022-10-30 DIAGNOSIS — R188 Other ascites: Secondary | ICD-10-CM | POA: Diagnosis not present

## 2022-10-30 DIAGNOSIS — E440 Moderate protein-calorie malnutrition: Secondary | ICD-10-CM | POA: Diagnosis not present

## 2022-10-30 DIAGNOSIS — D689 Coagulation defect, unspecified: Secondary | ICD-10-CM | POA: Diagnosis not present

## 2022-10-30 DIAGNOSIS — I6523 Occlusion and stenosis of bilateral carotid arteries: Secondary | ICD-10-CM | POA: Diagnosis not present

## 2022-10-30 DIAGNOSIS — F1091 Alcohol use, unspecified, in remission: Secondary | ICD-10-CM | POA: Diagnosis not present

## 2022-10-30 DIAGNOSIS — E872 Acidosis, unspecified: Secondary | ICD-10-CM | POA: Diagnosis not present

## 2022-10-30 DIAGNOSIS — Z944 Liver transplant status: Secondary | ICD-10-CM | POA: Diagnosis not present

## 2022-10-30 DIAGNOSIS — Z7682 Awaiting organ transplant status: Secondary | ICD-10-CM | POA: Diagnosis not present

## 2022-10-30 DIAGNOSIS — K7682 Hepatic encephalopathy: Secondary | ICD-10-CM | POA: Diagnosis not present

## 2022-10-30 DIAGNOSIS — I2510 Atherosclerotic heart disease of native coronary artery without angina pectoris: Secondary | ICD-10-CM | POA: Diagnosis not present

## 2022-10-30 DIAGNOSIS — N186 End stage renal disease: Secondary | ICD-10-CM | POA: Diagnosis not present

## 2022-10-30 DIAGNOSIS — K767 Hepatorenal syndrome: Secondary | ICD-10-CM | POA: Diagnosis not present

## 2022-10-30 DIAGNOSIS — R601 Generalized edema: Secondary | ICD-10-CM | POA: Diagnosis not present

## 2022-10-30 DIAGNOSIS — L89152 Pressure ulcer of sacral region, stage 2: Secondary | ICD-10-CM | POA: Diagnosis not present

## 2022-10-30 DIAGNOSIS — Z634 Disappearance and death of family member: Secondary | ICD-10-CM | POA: Diagnosis not present

## 2022-10-30 LAB — PATHOLOGIST SMEAR REVIEW

## 2022-10-31 LAB — CULTURE, BODY FLUID W GRAM STAIN -BOTTLE: Culture: NO GROWTH

## 2022-11-07 ENCOUNTER — Other Ambulatory Visit: Payer: BC Managed Care – PPO

## 2022-11-07 ENCOUNTER — Telehealth: Payer: Self-pay | Admitting: Gastroenterology

## 2022-11-07 NOTE — Telephone Encounter (Signed)
Good afternoon Dr. Lyndel Safe,  Patient called stating that she needed to cancel her appointment for her EGD for tomorrow at 4:00 due to being in the hospital in [place] for a transplant.

## 2022-11-08 ENCOUNTER — Encounter: Payer: Self-pay | Admitting: Gastroenterology

## 2022-11-18 DIAGNOSIS — Z992 Dependence on renal dialysis: Secondary | ICD-10-CM | POA: Diagnosis not present

## 2022-11-18 DIAGNOSIS — N186 End stage renal disease: Secondary | ICD-10-CM | POA: Diagnosis not present

## 2022-11-18 DIAGNOSIS — T8249XA Other complication of vascular dialysis catheter, initial encounter: Secondary | ICD-10-CM | POA: Diagnosis not present

## 2022-11-20 ENCOUNTER — Telehealth (INDEPENDENT_AMBULATORY_CARE_PROVIDER_SITE_OTHER): Payer: Self-pay

## 2022-11-20 NOTE — Transitions of Care (Post Inpatient/ED Visit) (Signed)
   11/20/2022  Name: [name] MRN: [id number] DOB: 1975-12-28  Today's TOC FU Call Status: Today's TOC FU Call Status:: Successful TOC FU Call Competed TOC FU Call Complete Date: 11/20/22  Transition Care Management Follow-up Telephone Call Date of Discharge: 11/16/22 Discharge Facility: Other ([place]) Name of Other ([place]) Discharge Facility: [facility] Type of Discharge: Inpatient Admission Primary Inpatient Discharge Diagnosis:: chronic hepatic failure How have you been since you were released from the hospital?: Better Any questions or concerns?: No  Items Reviewed: Did you receive and understand the discharge instructions provided?: Yes Medications obtained and verified?: Yes (Medications Reviewed) Any new allergies since your discharge?: No Dietary orders reviewed?: Yes Do you have support at home?: Yes  Home Care and Equipment/Supplies: [place] Ordered?: No Any new equipment or medical supplies ordered?: No  Functional Questionnaire: Do you need assistance with bathing/showering or dressing?: No Do you need assistance with meal preparation?: No Do you need assistance with eating?: No Do you have difficulty maintaining continence: No Do you need assistance with getting out of bed/getting out of a chair/moving?: No Do you have difficulty managing or taking your medications?: No  Folllow up appointments reviewed: PCP Follow-up appointment confirmed?: Yes Date of PCP follow-up appointment?: 11/21/22 Follow-up Provider: Juluis Mire NP [facility] Follow-up appointment confirmed?: No Do you need transportation to your follow-up appointment?: No Do you understand care options if your condition(s) worsen?: Yes-patient verbalized understanding    Liv[place]ood LPN [place] Direct Dial [phone]

## 2022-11-21 ENCOUNTER — Inpatient Hospital Stay (HOSPITAL_BASED_OUTPATIENT_CLINIC_OR_DEPARTMENT_OTHER): Payer: BC Managed Care – PPO | Admitting: Family Medicine

## 2022-11-21 ENCOUNTER — Telehealth: Payer: Self-pay

## 2022-11-21 DIAGNOSIS — T8249XA Other complication of vascular dialysis catheter, initial encounter: Secondary | ICD-10-CM | POA: Diagnosis not present

## 2022-11-21 DIAGNOSIS — K7031 Alcoholic cirrhosis of liver with ascites: Secondary | ICD-10-CM

## 2022-11-21 DIAGNOSIS — Z992 Dependence on renal dialysis: Secondary | ICD-10-CM | POA: Diagnosis not present

## 2022-11-21 DIAGNOSIS — N186 End stage renal disease: Secondary | ICD-10-CM | POA: Diagnosis not present

## 2022-11-21 DIAGNOSIS — K7030 Alcoholic cirrhosis of liver without ascites: Secondary | ICD-10-CM

## 2022-11-21 NOTE — Telephone Encounter (Signed)
IR paracentesis ordered and patient made aware. Gave her the number [phone] to call and schedule

## 2022-11-21 NOTE — Telephone Encounter (Signed)
Orders received from Dr. Lyndel Safe to order Paracentesis. Please order  Thanks  Remo Lipps RN

## 2022-11-21 NOTE — Telephone Encounter (Signed)
Pt called in stating that she needs another paracentesis. Pt states that she feels that she is carrying around a baby, Abdominal distention, weight gain, clothes are tighter, no shortness of breath. Pt stated that is wanting to reschedule EGD that she had to cancel from being in the hospital at [place] for two weeks. Pt states that she is on the transplant list now.  Please advise

## 2022-11-22 ENCOUNTER — Ambulatory Visit (HOSPITAL_COMMUNITY)
Admission: RE | Admit: 2022-11-22 | Discharge: 2022-11-22 | Disposition: A | Discharge status: Home / Self Care | Payer: BC Managed Care – PPO | Source: Ambulatory Visit | Attending: Gastroenterology | Admitting: Gastroenterology

## 2022-11-22 DIAGNOSIS — K7030 Alcoholic cirrhosis of liver without ascites: Secondary | ICD-10-CM | POA: Diagnosis not present

## 2022-11-22 DIAGNOSIS — K7031 Alcoholic cirrhosis of liver with ascites: Secondary | ICD-10-CM | POA: Diagnosis not present

## 2022-11-22 DIAGNOSIS — Z01818 Encounter for other preprocedural examination: Secondary | ICD-10-CM | POA: Diagnosis not present

## 2022-11-22 LAB — BODY FLUID CELL COUNT WITH DIFFERENTIAL
Eos, Fluid: 0 %
Lymphs, Fluid: 44 %
Monocyte-Macrophage-Serous Fluid: 54 % (ref 50–90)
Neutrophil Count, Fluid: 2 % (ref 0–25)
Total Nucleated Cell Count, Fluid: 254 cu mm (ref 0–1000)

## 2022-11-22 LAB — ALBUMIN, PLEURAL OR PERITONEAL FLUID: Albumin, Fluid: <1.5 g/dL

## 2022-11-22 LAB — PROTEIN, PLEURAL OR PERITONEAL FLUID: Total protein, fluid: <3 g/dL

## 2022-11-22 MED ORDER — LIDOCAINE HCL 1 % IJ SOLN
INTRAMUSCULAR | Status: AC
  Administered 2022-11-22: 15 mL
  Filled 2022-11-22: qty 20

## 2022-11-22 NOTE — Telephone Encounter (Signed)
Inbound call from patient, states [name] can not get her in for an appointment until Friday, patient is requesting to have location changed to [facility] because they can do the paracentesis today. Stated she talked to [facility] and was told, that the order had to be changed. Please advise.

## 2022-11-22 NOTE — Telephone Encounter (Signed)
Pt was rescheduled to [facility] for today 11/22/2022 at 1:30 PM. Pt to arrive at 1:00 pm. Pt made aware. Pt verbalized understanding with all questions answered.

## 2022-11-22 NOTE — Procedures (Signed)
PROCEDURE SUMMARY:  Successful ultrasound guided paracentesis from the left lower quadrant.  Yielded 5 liters of slightly hazy, yellow fluid.  No immediate complications.  The patient tolerated the procedure well.   Specimen was sent for labs.  EBL none  Patient currently being assessed for liver transplant at outside hospital. Will arrange [facility] assessment if patient is unable to undergo transplant.     Lindaann Pascal

## 2022-11-23 DIAGNOSIS — N179 Acute kidney failure, unspecified: Secondary | ICD-10-CM | POA: Diagnosis not present

## 2022-11-23 DIAGNOSIS — Z992 Dependence on renal dialysis: Secondary | ICD-10-CM | POA: Diagnosis not present

## 2022-11-23 DIAGNOSIS — N186 End stage renal disease: Secondary | ICD-10-CM | POA: Diagnosis not present

## 2022-11-23 DIAGNOSIS — T8249XA Other complication of vascular dialysis catheter, initial encounter: Secondary | ICD-10-CM | POA: Diagnosis not present

## 2022-11-23 LAB — TOTAL BILIRUBIN, BODY FLUID

## 2022-11-25 DIAGNOSIS — T8249XA Other complication of vascular dialysis catheter, initial encounter: Secondary | ICD-10-CM | POA: Diagnosis not present

## 2022-11-25 DIAGNOSIS — N186 End stage renal disease: Secondary | ICD-10-CM | POA: Diagnosis not present

## 2022-11-25 DIAGNOSIS — Z992 Dependence on renal dialysis: Secondary | ICD-10-CM | POA: Diagnosis not present

## 2022-11-25 LAB — BODY FLUID CULTURE W GRAM STAIN: Culture: NO GROWTH

## 2022-11-27 ENCOUNTER — Other Ambulatory Visit (HOSPITAL_COMMUNITY): Payer: Self-pay | Admitting: Nurse Practitioner

## 2022-11-27 DIAGNOSIS — L89154 Pressure ulcer of sacral region, stage 4: Secondary | ICD-10-CM | POA: Diagnosis not present

## 2022-11-27 DIAGNOSIS — I8510 Secondary esophageal varices without bleeding: Secondary | ICD-10-CM | POA: Diagnosis not present

## 2022-11-27 DIAGNOSIS — K7682 Hepatic encephalopathy: Secondary | ICD-10-CM | POA: Diagnosis not present

## 2022-11-27 DIAGNOSIS — K7031 Alcoholic cirrhosis of liver with ascites: Secondary | ICD-10-CM | POA: Diagnosis not present

## 2022-11-27 DIAGNOSIS — R188 Other ascites: Secondary | ICD-10-CM

## 2022-11-27 LAB — ANAEROBIC CULTURE W GRAM STAIN

## 2022-11-27 LAB — CYTOLOGY - NON PAP

## 2022-11-28 DIAGNOSIS — Z01818 Encounter for other preprocedural examination: Secondary | ICD-10-CM | POA: Diagnosis not present

## 2022-11-28 DIAGNOSIS — T8249XA Other complication of vascular dialysis catheter, initial encounter: Secondary | ICD-10-CM | POA: Diagnosis not present

## 2022-11-28 DIAGNOSIS — N186 End stage renal disease: Secondary | ICD-10-CM | POA: Diagnosis not present

## 2022-11-28 DIAGNOSIS — K7031 Alcoholic cirrhosis of liver with ascites: Secondary | ICD-10-CM | POA: Diagnosis not present

## 2022-11-28 DIAGNOSIS — Z992 Dependence on renal dialysis: Secondary | ICD-10-CM | POA: Diagnosis not present

## 2022-11-29 ENCOUNTER — Ambulatory Visit (HOSPITAL_COMMUNITY)
Admission: RE | Admit: 2022-11-29 | Discharge: 2022-11-29 | Disposition: A | Discharge status: Home / Self Care | Payer: BC Managed Care – PPO | Source: Ambulatory Visit | Attending: Nurse Practitioner | Admitting: Nurse Practitioner

## 2022-11-29 DIAGNOSIS — K7031 Alcoholic cirrhosis of liver with ascites: Secondary | ICD-10-CM | POA: Diagnosis not present

## 2022-11-29 DIAGNOSIS — R188 Other ascites: Secondary | ICD-10-CM | POA: Insufficient documentation

## 2022-11-29 HISTORY — PX: IR PARACENTESIS: IMG2679

## 2022-11-29 MED ORDER — ALBUMIN HUMAN 25 % IV SOLN
25.0000 g | Freq: Once | INTRAVENOUS | Status: AC
  Filled 2022-11-29: qty 100

## 2022-11-29 MED ORDER — ALBUMIN HUMAN 25 % IV SOLN
INTRAVENOUS | Status: AC
  Administered 2022-11-29: 25 g via INTRAVENOUS
  Filled 2022-11-29: qty 100

## 2022-11-29 MED ORDER — LIDOCAINE HCL 1 % IJ SOLN
INTRAMUSCULAR | Status: AC
  Filled 2022-11-29: qty 20

## 2022-11-29 MED ORDER — ALBUMIN HUMAN 25 % IV SOLN
INTRAVENOUS | Status: AC
  Filled 2022-11-29: qty 200

## 2022-11-29 NOTE — Procedures (Signed)
PROCEDURE SUMMARY:  Successful US guided paracentesis from left lateral abdomen.  Yielded 2.9 liters of bright yellow fluid.  No immediate complications.  Pt tolerated well.   Specimen was not sent for labs.  EBL < 34m  Patient currently being assessed for liver transplant at outside hospital. [facility]   KDocia BarrierPA-C 11/29/2022 2:59 PM

## 2022-11-30 DIAGNOSIS — T8249XA Other complication of vascular dialysis catheter, initial encounter: Secondary | ICD-10-CM | POA: Diagnosis not present

## 2022-11-30 DIAGNOSIS — N186 End stage renal disease: Secondary | ICD-10-CM | POA: Diagnosis not present

## 2022-11-30 DIAGNOSIS — Z992 Dependence on renal dialysis: Secondary | ICD-10-CM | POA: Diagnosis not present

## 2022-12-01 ENCOUNTER — Inpatient Hospital Stay (INDEPENDENT_AMBULATORY_CARE_PROVIDER_SITE_OTHER): Payer: BC Managed Care – PPO | Admitting: Primary Care

## 2022-12-02 DIAGNOSIS — Z992 Dependence on renal dialysis: Secondary | ICD-10-CM | POA: Diagnosis not present

## 2022-12-02 DIAGNOSIS — T8249XA Other complication of vascular dialysis catheter, initial encounter: Secondary | ICD-10-CM | POA: Diagnosis not present

## 2022-12-02 DIAGNOSIS — N186 End stage renal disease: Secondary | ICD-10-CM | POA: Diagnosis not present

## 2022-12-05 DIAGNOSIS — Z01818 Encounter for other preprocedural examination: Secondary | ICD-10-CM | POA: Diagnosis not present

## 2022-12-05 DIAGNOSIS — Z992 Dependence on renal dialysis: Secondary | ICD-10-CM | POA: Diagnosis not present

## 2022-12-05 DIAGNOSIS — T8249XA Other complication of vascular dialysis catheter, initial encounter: Secondary | ICD-10-CM | POA: Diagnosis not present

## 2022-12-05 DIAGNOSIS — N186 End stage renal disease: Secondary | ICD-10-CM | POA: Diagnosis not present

## 2022-12-05 DIAGNOSIS — K7031 Alcoholic cirrhosis of liver with ascites: Secondary | ICD-10-CM | POA: Diagnosis not present

## 2022-12-07 ENCOUNTER — Other Ambulatory Visit (HOSPITAL_COMMUNITY): Payer: Self-pay | Admitting: Nurse Practitioner

## 2022-12-07 DIAGNOSIS — Z7682 Awaiting organ transplant status: Secondary | ICD-10-CM | POA: Diagnosis not present

## 2022-12-07 DIAGNOSIS — R188 Other ascites: Secondary | ICD-10-CM

## 2022-12-07 DIAGNOSIS — N186 End stage renal disease: Secondary | ICD-10-CM | POA: Diagnosis not present

## 2022-12-07 DIAGNOSIS — T8249XA Other complication of vascular dialysis catheter, initial encounter: Secondary | ICD-10-CM | POA: Diagnosis not present

## 2022-12-07 DIAGNOSIS — Z992 Dependence on renal dialysis: Secondary | ICD-10-CM | POA: Diagnosis not present

## 2022-12-08 ENCOUNTER — Ambulatory Visit (HOSPITAL_COMMUNITY)
Admission: RE | Admit: 2022-12-08 | Discharge: 2022-12-08 | Disposition: A | Discharge status: Home / Self Care | Payer: BC Managed Care – PPO | Source: Ambulatory Visit | Attending: Nurse Practitioner | Admitting: Nurse Practitioner

## 2022-12-08 DIAGNOSIS — R188 Other ascites: Secondary | ICD-10-CM | POA: Insufficient documentation

## 2022-12-08 DIAGNOSIS — K7031 Alcoholic cirrhosis of liver with ascites: Secondary | ICD-10-CM | POA: Diagnosis not present

## 2022-12-08 HISTORY — PX: IR PARACENTESIS: IMG2679

## 2022-12-08 MED ORDER — LIDOCAINE HCL 1 % IJ SOLN
INTRAMUSCULAR | Status: AC
  Filled 2022-12-08: qty 20

## 2022-12-08 MED ORDER — ALBUMIN HUMAN 25 % IV SOLN
INTRAVENOUS | Status: AC
  Administered 2022-12-08: 12.5 g
  Filled 2022-12-08: qty 50

## 2022-12-08 NOTE — Procedures (Signed)
Ultrasound-guided diagnostic and therapeutic paracentesis performed yielding 4.3 L liters of straw colored fluid.  No immediate complications. EBL is none.

## 2022-12-09 DIAGNOSIS — Z992 Dependence on renal dialysis: Secondary | ICD-10-CM | POA: Diagnosis not present

## 2022-12-09 DIAGNOSIS — T8249XA Other complication of vascular dialysis catheter, initial encounter: Secondary | ICD-10-CM | POA: Diagnosis not present

## 2022-12-09 DIAGNOSIS — N186 End stage renal disease: Secondary | ICD-10-CM | POA: Diagnosis not present

## 2022-12-12 DIAGNOSIS — N186 End stage renal disease: Secondary | ICD-10-CM | POA: Diagnosis not present

## 2022-12-12 DIAGNOSIS — K7031 Alcoholic cirrhosis of liver with ascites: Secondary | ICD-10-CM | POA: Diagnosis not present

## 2022-12-12 DIAGNOSIS — T8249XA Other complication of vascular dialysis catheter, initial encounter: Secondary | ICD-10-CM | POA: Diagnosis not present

## 2022-12-12 DIAGNOSIS — Z992 Dependence on renal dialysis: Secondary | ICD-10-CM | POA: Diagnosis not present

## 2022-12-12 DIAGNOSIS — Z01818 Encounter for other preprocedural examination: Secondary | ICD-10-CM | POA: Diagnosis not present

## 2022-12-14 ENCOUNTER — Ambulatory Visit (HOSPITAL_COMMUNITY)
Admission: RE | Admit: 2022-12-14 | Discharge: 2022-12-14 | Disposition: A | Discharge status: Home / Self Care | Payer: BC Managed Care – PPO | Source: Ambulatory Visit | Attending: Nurse Practitioner | Admitting: Nurse Practitioner

## 2022-12-14 ENCOUNTER — Ambulatory Visit (INDEPENDENT_AMBULATORY_CARE_PROVIDER_SITE_OTHER): Payer: BC Managed Care – PPO | Admitting: Primary Care

## 2022-12-14 VITALS — BP 107/68 | HR 99 | Resp 16 | Wt 153.6 lb

## 2022-12-14 DIAGNOSIS — R188 Other ascites: Secondary | ICD-10-CM | POA: Diagnosis not present

## 2022-12-14 DIAGNOSIS — K7460 Unspecified cirrhosis of liver: Secondary | ICD-10-CM | POA: Diagnosis not present

## 2022-12-14 DIAGNOSIS — N186 End stage renal disease: Secondary | ICD-10-CM | POA: Diagnosis not present

## 2022-12-14 DIAGNOSIS — Z992 Dependence on renal dialysis: Secondary | ICD-10-CM | POA: Diagnosis not present

## 2022-12-14 DIAGNOSIS — Z09 Encounter for follow-up examination after completed treatment for conditions other than malignant neoplasm: Secondary | ICD-10-CM

## 2022-12-14 DIAGNOSIS — T8249XA Other complication of vascular dialysis catheter, initial encounter: Secondary | ICD-10-CM | POA: Diagnosis not present

## 2022-12-14 HISTORY — PX: IR PARACENTESIS: IMG2679

## 2022-12-14 MED ORDER — LIDOCAINE HCL 1 % IJ SOLN
INTRAMUSCULAR | Status: AC
  Filled 2022-12-14: qty 20

## 2022-12-14 MED ORDER — ALBUMIN HUMAN 25 % IV SOLN
25.0000 g | Freq: Once | INTRAVENOUS | Status: AC
  Administered 2022-12-14: 25 g via INTRAVENOUS

## 2022-12-14 MED ORDER — ALBUMIN HUMAN 25 % IV SOLN
INTRAVENOUS | Status: AC
  Filled 2022-12-14: qty 100

## 2022-12-14 NOTE — Procedures (Signed)
PROCEDURE SUMMARY:  Successful US guided paracentesis from left lateral abdomen.  Yielded 2.5 liters of yellow fluid.  No immediate complications.  Pt tolerated well.   Specimen was not sent for labs.  EBL < 99mL  Patient currently being assessed for liver transplant at outside hospital. Will arrange [facility] assessment if patient is unable to undergo transplant.   Docia Barrier PA-C 12/14/2022 3:00 PM

## 2022-12-14 NOTE — Progress Notes (Signed)
[facility]   Subjective:   [name] is a 47 y.o. female presents for hospital follow up . Admit date to the hospital was 3/21/3/27/24 at [facility]. She now has standing orders for paracentesis. Today she shares her last visit which she did not want to be admitted explain s/s GI doctor gave writer orders for paracentesis and would contact her GI doctor PA to f/u. She was hospitalized anyway. She is feeling better but now states not only does she need a liver she maybe needing a kidney transplant. She remains in [employer] mostly because of the causes and previous lifestyle. She has chronic abdominal pain and bloating with discomfort especially when her stomach enlarges with fluid. Patient has No headache, No chest pain, - No Nausea, No new weakness tingling or numbness, No Cough - shortness of breath   Past Medical History:  Diagnosis Date   Acid reflux    Alcohol dependence in remission (HCC)    Anemia    Renal disorder    Ulcer      Allergies  Allergen Reactions   Tape Rash    Clear tape      Current Outpatient Medications on File Prior to Visit  Medication Sig Dispense Refill   ciprofloxacin (CIPRO) 250 MG tablet Take by mouth.     ferrous sulfate 325 (65 FE) MG tablet Take 325 mg by mouth every morning.     folic acid (FOLVITE) 1 MG tablet Take 1 tablet (1 mg total) by mouth daily. 30 tablet 6   lactulose (CHRONULAC) 10 GM/15ML solution Take 15 mLs (10 g total) by mouth daily. 237 mL 1   melatonin 3 MG TABS tablet Take by mouth.     midodrine (PROAMATINE) 5 MG tablet Take by mouth.     OLANZapine (ZYPREXA) 2.5 MG tablet Take by mouth.     omeprazole (PRILOSEC) 20 MG capsule Take by mouth.     pantoprazole (PROTONIX) 40 MG tablet Take 1 tablet (40 mg total) by mouth 2 (two) times daily.     zinc sulfate 220 (50 Zn) MG capsule Take by mouth. (Patient not taking: Reported on 11/20/2022)     No current facility-administered medications on file prior  to visit.     Review of System: ROS  Objective:  Blood Pressure 107/68   Pulse 99   Respiration 16   Weight 153 lb 9.6 oz (69.7 kg)   Oxygen Saturation 100%   Body Mass Index 24.79 kg/m   Filed Weights   12/14/22 1654  Weight: 153 lb 9.6 oz (69.7 kg)    Physical Exam: General Appearance: Well nourished, in no apparent distress. Eyes: PERRLA, EOMs, conjunctiva no swelling or erythema Sinuses: No Frontal/maxillary tenderness ENT/Mouth: Ext aud canals clear, TMs without erythema, bulging. No erythema, swelling, or exudate on post pharynx.  Tonsils not swollen or erythematous. Hearing normal.  Neck: Supple, thyroid normal.  Respiratory: Respiratory effort normal, BS equal bilaterally without rales, rhonchi, wheezing or stridor.  Cardio: RRR with no MRGs. Brisk peripheral pulses without edema.  Abdomen: Soft, + BS.  Non tender, no guarding, rebound, hernias, masses. Lymphatics: Non tender without lymphadenopathy.  Musculoskeletal: Full ROM, 5/5 strength, normal gait.  Skin: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CMS/HCC  Neuro: Cranial nerves intact. Normal muscle tone, no cerebellar symptoms. Sensation intact.  Psych: Awake and oriented X 3, normal affect, Insight and Judgment appropriate.    Assessment:   [name] was seen today for hospitalization follow-up.  Diagnoses  and all orders for this visit:  Hospital discharge follow-up Alcoholic cirrhosis of liver with ascites (CMS/HCC) (Primary Dx);  Other ascites;  Secondary esophageal varices without bleeding (CMS/HCC);  Hepatic encephalopathy (CMS/HCC);  Managed by GI   This note has been created with Surveyor, quantity. Any transcriptional errors are unintentional.   Kerin Perna, NP 12/14/2022, 5:23 PM

## 2022-12-15 ENCOUNTER — Encounter (HOSPITAL_BASED_OUTPATIENT_CLINIC_OR_DEPARTMENT_OTHER): Payer: BC Managed Care – PPO | Attending: General Surgery | Admitting: General Surgery

## 2022-12-15 DIAGNOSIS — D631 Anemia in chronic kidney disease: Secondary | ICD-10-CM | POA: Insufficient documentation

## 2022-12-15 DIAGNOSIS — F17210 Nicotine dependence, cigarettes, uncomplicated: Secondary | ICD-10-CM | POA: Diagnosis not present

## 2022-12-15 DIAGNOSIS — I129 Hypertensive chronic kidney disease with stage 1 through stage 4 chronic kidney disease, or unspecified chronic kidney disease: Secondary | ICD-10-CM | POA: Insufficient documentation

## 2022-12-15 DIAGNOSIS — N183 Chronic kidney disease, stage 3 unspecified: Secondary | ICD-10-CM | POA: Insufficient documentation

## 2022-12-15 DIAGNOSIS — F32A Depression, unspecified: Secondary | ICD-10-CM | POA: Diagnosis not present

## 2022-12-15 DIAGNOSIS — L98492 Non-pressure chronic ulcer of skin of other sites with fat layer exposed: Secondary | ICD-10-CM | POA: Diagnosis not present

## 2022-12-15 DIAGNOSIS — L732 Hidradenitis suppurativa: Secondary | ICD-10-CM | POA: Insufficient documentation

## 2022-12-15 DIAGNOSIS — K7460 Unspecified cirrhosis of liver: Secondary | ICD-10-CM | POA: Diagnosis not present

## 2022-12-16 DIAGNOSIS — N186 End stage renal disease: Secondary | ICD-10-CM | POA: Diagnosis not present

## 2022-12-16 DIAGNOSIS — T8249XA Other complication of vascular dialysis catheter, initial encounter: Secondary | ICD-10-CM | POA: Diagnosis not present

## 2022-12-16 DIAGNOSIS — Z992 Dependence on renal dialysis: Secondary | ICD-10-CM | POA: Diagnosis not present

## 2022-12-16 NOTE — Progress Notes (Signed)
BRAILEE, BOUDREAU R ([id number]) [id number] Page 1 of 9 Visit Report for 12/15/2022 Chief Complaint Document Details Patient Name: Date of Service: [name], [name] 12/15/2022 9:00 A M Medical Record Number: [id number] Patient Account Number: [id number] Date of Birth/Sex: Treating RN: 05-19-1976 (47 y.o. F) Primary Care Provider: Juluis Mire Other Clinician: Referring Provider: Treating Provider/Extender: Patrici Ranks in Treatment: 0 Information Obtained from: Patient Chief Complaint Patient seen for complaints of Non-Healing Wounds. Electronic Signature(s) Signed: 12/15/2022 12:09:45 PM By: Fredirick Maudlin MD FACS Entered By: Fredirick Maudlin on 12/15/2022 12:09:45 -------------------------------------------------------------------------------- Debridement Details Patient Name: Date of Service: Lori Hawking R. 12/15/2022 9:00 A M Medical Record Number: [id number] Patient Account Number: [id number] Date of Birth/Sex: Treating RN: 04/18/1976 (47 y.o. Iver Nestle, Jamie Primary Care Provider: Juluis Mire Other Clinician: Referring Provider: Treating Provider/Extender: Patrici Ranks in Treatment: 0 Debridement Performed for Assessment: Wound #2 Left Gluteal fold Performed By: Physician Fredirick Maudlin, MD Debridement Type: Debridement Level of Consciousness (Pre-procedure): Awake and Alert Pre-procedure Verification/Time Out Yes - 10:00 Taken: Start Time: 10:01 Pain Control: Lidocaine 4% T opical Solution T Area Debrided (L x W): otal 1.9 (cm) x 1.6 (cm) = 3.04 (cm) Tissue and other material debrided: Non-Viable, Slough, Slough Level: Non-Viable Tissue Debridement Description: Selective/Open Wound Instrument: Curette Bleeding: Minimum Hemostasis Achieved: Pressure Response to Treatment: Procedure was tolerated well Level of Consciousness (Post- Awake and Alert procedure): Post  Debridement Measurements of Total Wound Length: (cm) 1.9 Width: (cm) 1.6 Depth: (cm) 0.1 Volume: (cm) 0.239 Character of Wound/Ulcer Post Debridement: Requires Further Debridement Post Procedure Diagnosis Same as Pre-procedure Notes Scribed for Dr. Celine Ahr by Blanche East, RN Electronic Signature(s) Signed: 12/15/2022 1:35:04 PM By: Fredirick Maudlin MD FACS Signed: 12/15/2022 4:08:25 PM By: Blanche East RN Entered By: Blanche East on 12/15/2022 10:02:25 [name] ([id number]) [id number] Page 2 of 9 -------------------------------------------------------------------------------- HPI Details Patient Name: Date of Service: [name] 12/15/2022 9:00 A M Medical Record Number: [id number] Patient Account Number: [id number] Date of Birth/Sex: Treating RN: [birth year] (47 y.o. F) Primary Care Provider: Juluis Mire Other Clinician: Referring Provider: Treating Provider/Extender: Patrici Ranks in Treatment: 0 History of Present Illness HPI Description: ADMISSION 05/24/2022 This is a 47 year old woman with a past medical history significant for hypertension, alcoholic hepatitis, chronic anemia, chronic kidney disease, and peptic ulcer disease. She was admitted to the hospital in the middle of July for hypovolemic shock secondary to dehydration from gastroenteritis. She was found on admission to have an ulcer on her left buttock within the confines of the natal cleft and approaching the anal verge. The etiology was felt to be moisture and friction rather than pressure, as the patient is completely ambulatory. No debridement was performed. She was readmitted at the beginning of August with worsening lower extremity edema and fatigue. The wound was basically unchanged. Wound nursing was consulted and they recommended using silver alginate. She was discharged from the hospital with referral to the wound care center. This  past weekend, she noted a wound on her dorsal left foot that she thinks was secondary to friction from shoes. She is here for evaluation of both these wounds. ABI in clinic today was 1.18. She reports that she has quit drinking but she continues to smoke. On the dorsal surface of her left foot between her fourth and fifth metatarsal heads, there is a circular wound exposing the fat layer. There is some slough and eschar accumulation. It does appear consistent with  her history of abrasion. On the medial aspect of her left buttock extending into the natal cleft and towards the anal verge. There is a large ulcer with a very clean surface. There is substantial undermining at the cranial portion of the wound. No purulent drainage or malodor. 06/08/2022: The patient returns today with significant worsening of her dorsal foot wound. Apparently she saw her PCP yesterday who was concerned for cellulitis but did not prescribe any antibiotics. She was also sent for a DVT scan which was negative. There is heavy slough as well as liquefactive necrosis of the fat layer. There is a bulla between the second and third toes that looks as though it may rupture in the near future. The wound undermines under the skin towards this bulla. No frankly purulent drainage or any odor. Her gluteal wound is very clean but is relatively unchanged in size. She has been managing her wounds on her own and finds it difficult to pack the silver alginate into the undermined portion of the buttock wound. She says that she has been putting peroxide on her foot. 06/19/2022: The patient was not seen as a wound care visit last week because she arrived too late to her appointment. She did have a nursing visit, however. The culture that I took on 14 September grew out a polymicrobial population including Bacteroides fragilis Citrobacter Morganella Escherichia coli Enterococcus faecalis Pseudomonas aeruginosa and many others. I prescribed Bactrim  and Augmentin for this polymicrobial infection based upon the resistance genes detected. Keystone topical compounded antibiotic was also ordered and she has been using that on her foot as well. Unfortunately, the wound on her dorsal foot continues to deteriorate. There is a larger area of undermining and more necrotic tissue, including muscle, on the wound surface. No significant malodor or frank pus. Her gluteal wound, on the other hand, looks quite good. It is smaller and very clean and the undermined portion has contracted substantially. 06/27/2022: Last week after our visit, she went to the hospital where she was admitted with sepsis. She underwent surgical debridement of her dorsal foot wound. Cultures taken intraoperatively were negative and a piece of PuraPly was sutured in place. She is currently taking Augmentin as prescribed on discharge. Her gluteal wound continues to contract. It is more superficial and there is even less undermining. She does have some biofilm accumulation on the surface. The dorsal foot wound is stained yellow from the Xeroform that was applied by podiatry. PuraPly is covering about a third of the wound. The rest of the wound surface is very clean. She did have quite a bit of drainage, but it was not purulent or malodorous. 07/05/2022: The wound on her buttocks continues to contract. It is very clean without any accumulation of slough or other debris. The PuraPly that was on her foot came off with her dressing. There are a couple of sutures remaining around the perimeter of the wound. There is slough buildup but no signs of necrosis. Granulation tissue is starting to form. 10/19; the patient has 2 wounds in the left buttock in the gluteal fold which was a pressure ulcer from a stay in hospital during the summer. She has a more recent wound on the left dorsal foot which was apparently complications of infection. Both wound surfaces look healthy. She is using silver alginate  on the left foot 07/24/2022: I have not seen her in 2 weeks and both wounds have contracted considerably since then. The dorsal foot wound has come in by  a centimeter in width since her last visit. The gluteal wound is quite clean; there is a bit of slough buildup on the dorsal foot wound. 07/31/2022: Both wounds are smaller again today. There is some dry eschar around the perimeter of the dorsal foot wound. There is senescent skin that has accumulated around the perimeter of the gluteal wound. No concern for infection. 08/10/2022: The wounds continue to improve. The dorsal foot wound is nearly closed with just a linear opening and some light eschar. The gluteal wound has a little slough on the surface. She has some newly opened hidradenitis lesions, however. In the past, I had noted her scars, but she had not shown any signs of active disease. She does report that she has had previous areas open in her armpits and groin area, similar to those in her natal cleft. 08/24/2022: Her dorsal foot wound is healed. Her gluteal cleft wound is quite a bit smaller with just a light layer of slough on the surface. Unfortunately, for some reason her pharmacy did not give her the Cleocin lotion that I prescribed when she went to pick up prescriptions yesterday. She still has open and active hidradenitis lesions on her buttocks. 09/12/2022: The gluteal wound is a little bit smaller today and very clean. She has been using the Cleocin topically. She has a appointment with dermatology, but not until May. There is some purulent and bloody drainage coming from several of the hidradenitis lesions, but the majority actually look better. READMISSION 12/15/2022 Since her last visit with [place], the patient has been placed on the transplant wait list for both liver and kidney transplants. Her gluteal wound is markedly smaller and very clean. Her hidradenitis appears to be relatively quiescent, but she did report having a recent  flare. Electronic Signature(s) Signed: 12/15/2022 12:11:03 PM By: Fredirick Maudlin MD FACS Entered By: Fredirick Maudlin on 12/15/2022 12:11:03 [name] ([id number] Page 3 of 9 -------------------------------------------------------------------------------- Physical Exam Details Patient Name: Date of Service: MOREEN, CRANMER 12/15/2022 9:00 A M Medical Record Number: [id number] Patient Account Number: [id number] Date of Birth/Sex: Treating RN: 1976-01-13 (47 y.o. F) Primary Care Provider: Juluis Mire Other Clinician: Referring Provider: Treating Provider/Extender: Patrici Ranks in Treatment: 0 Constitutional Hypotensive, asymptomatic. . . . No acute distress. Respiratory Normal work of breathing on room air. Notes 12/15/2022: The wound on her left buttock is significantly smaller than the last time she was seen here. The surface is clean with just a little bit of slough accumulation. No concern for infection. Electronic Signature(s) Signed: 12/15/2022 12:11:50 PM By: Fredirick Maudlin MD FACS Entered By: Fredirick Maudlin on 12/15/2022 12:11:50 -------------------------------------------------------------------------------- Physician Orders Details Patient Name: Date of Service: Lori Hawking R. 12/15/2022 9:00 A M Medical Record Number: [id number] Patient Account Number: [id number] Date of Birth/Sex: Treating RN: October 07, 1975 (47 y.o. Marta Lamas Primary Care Provider: Juluis Mire Other Clinician: Referring Provider: Treating Provider/Extender: Patrici Ranks in Treatment: 0 Verbal / Phone Orders: No Diagnosis Coding ICD-10 Coding Code Description [id number] Non-pressure chronic ulcer of skin of other sites with fat layer exposed L73.2 Hidradenitis suppurativa Follow-up Appointments ppointment in 2 weeks. - Dr. Celine Ahr Return A Other: - Pickup topical  antibiotic from pharmacy Anesthetic Wound #2 Left Gluteal fold (In clinic) Topical Lidocaine 5% applied to wound bed Bathing/ Shower/ Hygiene May shower and wash wound with soap and water. - with dressing changes Edema Control - Lymphedema / SCD / Other Avoid standing for long periods  of time. Patient to wear own compression stockings every day. Wound Treatment Wound #2 - Gluteal fold Wound Laterality: Left Cleanser: Soap and Water 1 x Per Day/30 Days Discharge Instructions: May shower and wash wound with dial antibacterial soap and water prior to dressing change. Cleanser: Wound Cleanser 1 x Per Day/30 Days Discharge Instructions: Cleanse the wound with wound cleanser prior to applying a clean dressing using gauze sponges, not tissue or cotton balls. Peri-Wound Care: Skin Prep (DME) (Generic) 1 x Per Day/30 Days Discharge Instructions: Use skin prep as directed Prim Dressing: Maxorb Extra Ag+ Alginate Dressing, 2x2 (in/in) (DME) (Dispense As Written) 1 x Per Day/30 Days ary Discharge Instructions: Apply to wound bed as instructed YAVONDA, STARIHA R ([id number]) [id number].pdf Page 4 of 9 Secondary Dressing: ALLEVYN Gentle Border, 3x3 (in/in) (DME) (Generic) 1 x Per Day/30 Days Discharge Instructions: Apply over primary dressing as directed. Patient Medications llergies: adhesive tape A Notifications Medication Indication Start End 12/15/2022 clindamycin phosphate DOSE topical 1 % gel - Apply to hidradenitis lesions once daily Electronic Signature(s) Signed: 12/15/2022 12:12:55 PM By: Fredirick Maudlin MD FACS Entered By: Fredirick Maudlin on 12/15/2022 12:12:55 -------------------------------------------------------------------------------- Problem List Details Patient Name: Date of Service: Lori Hawking R. 12/15/2022 9:00 A M Medical Record Number: [id number] Patient Account Number: [id number] Date of Birth/Sex: Treating RN: [birth year] (47 y.o.  F) Primary Care Provider: Juluis Mire Other Clinician: Referring Provider: Treating Provider/Extender: Patrici Ranks in Treatment: 0 Active Problems ICD-10 Encounter Code Description Active Date MDM Diagnosis L98.492 Non-pressure chronic ulcer of skin of other sites with fat layer exposed 12/15/2022 No Yes L73.2 Hidradenitis suppurativa 12/15/2022 No Yes Inactive Problems Resolved Problems Electronic Signature(s) Signed: 12/15/2022 12:09:27 PM By: Fredirick Maudlin MD FACS Entered By: Fredirick Maudlin on 12/15/2022 12:09:27 -------------------------------------------------------------------------------- Progress Note Details Patient Name: Date of Service: Lori Hawking R. 12/15/2022 9:00 A M Medical Record Number: [id number] Patient Account Number: [id number] Date of Birth/Sex: Treating RN: 06-Feb-1976 (47 y.o. F) Primary Care Provider: Juluis Mire Other Clinician: Referring Provider: Treating Provider/Extender: Patrici Ranks in Treatment: 0 Subjective Chief Complaint Information obtained from Patient Patient seen for complaints of Non-Healing Wounds. History of Present Illness (HPI) ADMISSION 05/24/2022 ZAYLEEN, KOSAKOWSKI ([id number]) [id number] Page 5 of 9 This is a 47 year old woman with a past medical history significant for hypertension, alcoholic hepatitis, chronic anemia, chronic kidney disease, and peptic ulcer disease. She was admitted to the hospital in the middle of July for hypovolemic shock secondary to dehydration from gastroenteritis. She was found on admission to have an ulcer on her left buttock within the confines of the natal cleft and approaching the anal verge. The etiology was felt to be moisture and friction rather than pressure, as the patient is completely ambulatory. No debridement was performed. She was readmitted at the beginning of August with worsening lower  extremity edema and fatigue. The wound was basically unchanged. Wound nursing was consulted and they recommended using silver alginate. She was discharged from the hospital with referral to the wound care center. This past weekend, she noted a wound on her dorsal left foot that she thinks was secondary to friction from shoes. She is here for evaluation of both these wounds. ABI in clinic today was 1.18. She reports that she has quit drinking but she continues to smoke. On the dorsal surface of her left foot between her fourth and fifth metatarsal heads, there is a circular wound exposing the fat layer. There is some slough and eschar accumulation.  It does appear consistent with her history of abrasion. On the medial aspect of her left buttock extending into the natal cleft and towards the anal verge. There is a large ulcer with a very clean surface. There is substantial undermining at the cranial portion of the wound. No purulent drainage or malodor. 06/08/2022: The patient returns today with significant worsening of her dorsal foot wound. Apparently she saw her PCP yesterday who was concerned for cellulitis but did not prescribe any antibiotics. She was also sent for a DVT scan which was negative. There is heavy slough as well as liquefactive necrosis of the fat layer. There is a bulla between the second and third toes that looks as though it may rupture in the near future. The wound undermines under the skin towards this bulla. No frankly purulent drainage or any odor. Her gluteal wound is very clean but is relatively unchanged in size. She has been managing her wounds on her own and finds it difficult to pack the silver alginate into the undermined portion of the buttock wound. She says that she has been putting peroxide on her foot. 06/19/2022: The patient was not seen as a wound care visit last week because she arrived too late to her appointment. She did have a nursing visit, however. The culture  that I took on 14 September grew out a polymicrobial population including Bacteroides fragilis Citrobacter Morganella Escherichia coli Enterococcus faecalis Pseudomonas aeruginosa and many others. I prescribed Bactrim and Augmentin for this polymicrobial infection based upon the resistance genes detected. Keystone topical compounded antibiotic was also ordered and she has been using that on her foot as well. Unfortunately, the wound on her dorsal foot continues to deteriorate. There is a larger area of undermining and more necrotic tissue, including muscle, on the wound surface. No significant malodor or frank pus. Her gluteal wound, on the other hand, looks quite good. It is smaller and very clean and the undermined portion has contracted substantially. 06/27/2022: Last week after our visit, she went to the hospital where she was admitted with sepsis. She underwent surgical debridement of her dorsal foot wound. Cultures taken intraoperatively were negative and a piece of PuraPly was sutured in place. She is currently taking Augmentin as prescribed on discharge. Her gluteal wound continues to contract. It is more superficial and there is even less undermining. She does have some biofilm accumulation on the surface. The dorsal foot wound is stained yellow from the Xeroform that was applied by podiatry. PuraPly is covering about a third of the wound. The rest of the wound surface is very clean. She did have quite a bit of drainage, but it was not purulent or malodorous. 07/05/2022: The wound on her buttocks continues to contract. It is very clean without any accumulation of slough or other debris. The PuraPly that was on her foot came off with her dressing. There are a couple of sutures remaining around the perimeter of the wound. There is slough buildup but no signs of necrosis. Granulation tissue is starting to form. 10/19; the patient has 2 wounds in the left buttock in the gluteal fold which was a  pressure ulcer from a stay in hospital during the summer. She has a more recent wound on the left dorsal foot which was apparently complications of infection. Both wound surfaces look healthy. She is using silver alginate on the left foot 07/24/2022: I have not seen her in 2 weeks and both wounds have contracted considerably since then. The dorsal foot  wound has come in by a centimeter in width since her last visit. The gluteal wound is quite clean; there is a bit of slough buildup on the dorsal foot wound. 07/31/2022: Both wounds are smaller again today. There is some dry eschar around the perimeter of the dorsal foot wound. There is senescent skin that has accumulated around the perimeter of the gluteal wound. No concern for infection. 08/10/2022: The wounds continue to improve. The dorsal foot wound is nearly closed with just a linear opening and some light eschar. The gluteal wound has a little slough on the surface. She has some newly opened hidradenitis lesions, however. In the past, I had noted her scars, but she had not shown any signs of active disease. She does report that she has had previous areas open in her armpits and groin area, similar to those in her natal cleft. 08/24/2022: Her dorsal foot wound is healed. Her gluteal cleft wound is quite a bit smaller with just a light layer of slough on the surface. Unfortunately, for some reason her pharmacy did not give her the Cleocin lotion that I prescribed when she went to pick up prescriptions yesterday. She still has open and active hidradenitis lesions on her buttocks. 09/12/2022: The gluteal wound is a little bit smaller today and very clean. She has been using the Cleocin topically. She has a appointment with dermatology, but not until May. There is some purulent and bloody drainage coming from several of the hidradenitis lesions, but the majority actually look better. READMISSION 12/15/2022 Since her last visit with [place], the patient has  been placed on the transplant wait list for both liver and kidney transplants. Her gluteal wound is markedly smaller and very clean. Her hidradenitis appears to be relatively quiescent, but she did report having a recent flare. Patient History Information obtained from Patient. Allergies adhesive tape Family History Cancer - Mother, Diabetes - Mother, Hypertension - Mother,Maternal Grandparents,Paternal Grandparents, No family history of Heart Disease, Hereditary Spherocytosis, Kidney Disease, Lung Disease, Seizures, Stroke, Thyroid Problems, Tuberculosis. Social History Current every day smoker - .5 pack a day, Marital Status - Single, Alcohol Use - Never, Drug Use - No History, Caffeine Use - Daily. Medical History Hematologic/Lymphatic Patient has history of Anemia, Lymphedema Cardiovascular Patient has history of Hypertension, Hypotension Gastrointestinal Patient has history of Cirrhosis Endocrine Denies history of Type I Diabetes, Type II Diabetes Hospitalization/Surgery History - esophagogastroduodenoscopy. - biopsy. - cholecystectomy. - breast biopsy. - abdominoplasty. - tummy tuck. - hernia repair. - roux-en-y procedure. - tumor removal. - wrist ganglion incision. NEHA, WALDRIDGE R ([id number]) [id number] Page 6 of 9 Medical A Surgical History Notes nd Gastrointestinal slow GI blood loss, alcoholic hepatitis Genitourinary CKD stage III Psychiatric depression Objective Constitutional Hypotensive, asymptomatic. No acute distress. Vitals Time Taken: 9:32 AM, Height: 66 in, Weight: 150 lbs, BMI: 24.2, Temperature: 98.5 F, Pulse: 93 bpm, Respiratory Rate: 20 breaths/min, Blood Pressure: 95/60 mmHg. Respiratory Normal work of breathing on room air. General Notes: 12/15/2022: The wound on her left buttock is significantly smaller than the last time she was seen here. The surface is clean with just a little bit of slough accumulation. No concern for  infection. Integumentary (Hair, Skin) Wound #2 status is Open. Original cause of wound was Shear/Friction. The date acquired was: 03/20/2022. The wound has been in treatment 29 weeks. The wound is located on the Left Gluteal fold. The wound measures 1.9cm length x 1.6cm width x 0.4cm depth; 2.388cm^2 area and 0.955cm^3 volume. There is Fat  Layer (Subcutaneous Tissue) exposed. There is no tunneling or undermining noted. There is a medium amount of serosanguineous drainage noted. There is large (67-100%) pink granulation within the wound bed. There is a small (1-33%) amount of necrotic tissue within the wound bed. The periwound skin appearance had no abnormalities noted for color. The periwound skin appearance exhibited: Scarring. The periwound skin appearance did not exhibit: Dry/Scaly, Maceration. Periwound temperature was noted as No Abnormality. Assessment Active Problems ICD-10 Non-pressure chronic ulcer of skin of other sites with fat layer exposed Hidradenitis suppurativa Procedures Wound #2 Pre-procedure diagnosis of Wound #2 is a Trauma, Other located on the Left Gluteal fold . There was a Selective/Open Wound Non-Viable Tissue Debridement with a total area of 3.04 sq cm performed by Fredirick Maudlin, MD. With the following instrument(s): Curette to remove Non-Viable tissue/material. Material removed includes [facility] after achieving pain control using Lidocaine 4% Topical Solution. No specimens were taken. A time out was conducted at 10:00, prior to the start of the procedure. A Minimum amount of bleeding was controlled with Pressure. The procedure was tolerated well. Post Debridement Measurements: 1.9cm length x 1.6cm width x 0.1cm depth; 0.239cm^3 volume. Character of Wound/Ulcer Post Debridement requires further debridement. Post procedure Diagnosis Wound #2: Same as Pre-Procedure General Notes: Scribed for Dr. Celine Ahr by Blanche East, RN. Plan Follow-up Appointments: Return  Appointment in 2 weeks. - Dr. Celine Ahr Other: Arlyss Gandy topical antibiotic from pharmacy Anesthetic: Wound #2 Left Gluteal fold: (In clinic) Topical Lidocaine 5% applied to wound bed Bathing/ Shower/ Hygiene: May shower and wash wound with soap and water. - with dressing changes Edema Control - Lymphedema / SCD / Other: Avoid standing for long periods of time. Patient to wear own compression stockings every day. ARAMINTA, DERHAMMER R ([id number]) [id number] Page 7 of 9 The following medication(s) was prescribed: clindamycin phosphate topical 1 % gel Apply to hidradenitis lesions once daily starting 12/15/2022 WOUND #2: - Gluteal fold Wound Laterality: Left Cleanser: Soap and Water 1 x Per Day/30 Days Discharge Instructions: May shower and wash wound with dial antibacterial soap and water prior to dressing change. Cleanser: Wound Cleanser 1 x Per Day/30 Days Discharge Instructions: Cleanse the wound with wound cleanser prior to applying a clean dressing using gauze sponges, not tissue or cotton balls. Peri-Wound Care: Skin Prep (DME) (Generic) 1 x Per Day/30 Days Discharge Instructions: Use skin prep as directed Prim Dressing: Maxorb Extra Ag+ Alginate Dressing, 2x2 (in/in) (DME) (Dispense As Written) 1 x Per Day/30 Days ary Discharge Instructions: Apply to wound bed as instructed Secondary Dressing: ALLEVYN Gentle Border, 3x3 (in/in) (DME) (Generic) 1 x Per Day/30 Days Discharge Instructions: Apply over primary dressing as directed. 12/15/2022: The wound on her left buttock is significantly smaller than the last time she was seen here. The surface is clean with just a little bit of slough accumulation. No concern for infection. I used a curette to debride the slough from her wound. We will use silver alginate and foam border dressing. She does not have an appointment with dermatology until May and requested more clindamycin gel for her hidradenitis lesions, should she  have a flare between now and then. Prescription was sent in electronically. She will follow-up here in 2 weeks. Electronic Signature(s) Signed: 12/15/2022 12:13:52 PM By: Fredirick Maudlin MD FACS Entered By: Fredirick Maudlin on 12/15/2022 12:13:51 -------------------------------------------------------------------------------- HxROS Details Patient Name: Date of Service: Lori Hawking R. 12/15/2022 9:00 A M Medical Record Number: [id number] Patient Account Number: [id number] Date of Birth/Sex: Treating RN:  Aug 18, 1976 (47 y.o. Marta Lamas Primary Care Provider: Juluis Mire Other Clinician: Referring Provider: Treating Provider/Extender: Patrici Ranks in Treatment: 0 Information Obtained From Patient Hematologic/Lymphatic Medical History: Positive for: Anemia; Lymphedema Cardiovascular Medical History: Positive for: Hypertension; Hypotension Gastrointestinal Medical History: Positive for: Cirrhosis Past Medical History Notes: slow GI blood loss, alcoholic hepatitis Endocrine Medical History: Negative for: Type I Diabetes; Type II Diabetes Genitourinary Medical History: Past Medical History Notes: CKD stage III Psychiatric Medical History: Past Medical History Notes: depression Immunizations ANYELIN, HORWITZ ([id number]) [id number] Page 8 of 9 Pneumococcal Vaccine: Received Pneumococcal Vaccination: No Implantable Devices None Hospitalization / Surgery History Type of Hospitalization/Surgery esophagogastroduodenoscopy biopsy cholecystectomy breast biopsy abdominoplasty tummy tuck hernia repair roux-en-y procedure tumor removal wrist ganglion incision Family and Social History Cancer: Yes - Mother; Diabetes: Yes - Mother; Heart Disease: No; Hereditary Spherocytosis: No; Hypertension: Yes - Mother,Maternal Grandparents,Paternal Grandparents; Kidney Disease: No; Lung Disease: No; Seizures: No;  Stroke: No; Thyroid Problems: No; Tuberculosis: No; Current every day smoker - .5 pack a day; Marital Status - Single; Alcohol Use: Never; Drug Use: No History; Caffeine Use: Daily; Financial Concerns: Yes; Food, Clothing or Shelter Needs: No; Support System Lacking: No; Transportation Concerns: No Engineer, maintenance) Signed: 12/15/2022 1:35:04 PM By: Fredirick Maudlin MD FACS Signed: 12/15/2022 4:08:25 PM By: Blanche East RN Entered By: Blanche East on 12/15/2022 09:37:48 -------------------------------------------------------------------------------- [place] Details Patient Name: Date of Service: Ernie Hew 12/15/2022 Medical Record Number: [id number] Patient Account Number: [id number] Date of Birth/Sex: Treating RN: 1976-01-27 (47 y.o. F) Primary Care Provider: Juluis Mire Other Clinician: Referring Provider: Treating Provider/Extender: Patrici Ranks in Treatment: 0 Diagnosis Coding ICD-10 Codes Code Description [phone] Non-pressure chronic ulcer of skin of other sites with fat layer exposed L73.2 Hidradenitis suppurativa Facility Procedures : CPT4 Code: [id number] Description: [id number] - DEBRIDE WOUND 1ST 20 SQ CM OR < ICD-10 Diagnosis Description L98.492 Non-pressure chronic ulcer of skin of other sites with fat layer exposed Modifier: Quantity: 1 Physician Procedures : CPT4 Code Description Modifier [id number] - WC PHYS LEVEL 4 - EST PT 25 ICD-10 Diagnosis Description L98.492 Non-pressure chronic ulcer of skin of other sites with fat layer exposed L73.2 Hidradenitis suppurativa Quantity: 1 : [id number] - WC PHYS DEBR WO ANESTH 20 SQ CM ICD-10 Diagnosis Description L98.492 Non-pressure chronic ulcer of skin of other sites with fat layer exposed Quantity: 1 Electronic Signature(s) Signed: 12/15/2022 12:14:03 PM By: Fredirick Maudlin MD FACS [place], [place] R ([id number]) PM By: Fredirick Maudlin MD FACS  [phone].pdf Page 9 of 9 Signed: 12/15/2022 12:14:03 Entered By: Fredirick Maudlin on 12/15/2022 12:14:03

## 2022-12-16 NOTE — Progress Notes (Signed)
[name], [name] ([id number]) [id number] Page 1 of 4 Visit Report for 12/15/2022 Abuse Risk Screen Details Patient Name: Date of Service: [name], [name] 12/15/2022 9:00 A M Medical Record Number: [id number] Patient Account Number: [id number] Date of Birth/Sex: Treating RN: 09/04/1976 (47 y.o. Marta Lamas Primary Care Liliana Brentlinger: Juluis Mire Other Clinician: Referring Manny Vitolo: Treating Blondina Coderre/Extender: Patrici Ranks in Treatment: 0 Abuse Risk Screen Items Answer ABUSE RISK SCREEN: Has anyone close to you tried to hurt or harm you recentlyo No Do you feel uncomfortable with anyone in your familyo No Has anyone forced you do things that you didnt want to doo No Electronic Signature(s) Signed: 12/15/2022 4:08:25 PM By: Blanche East RN Entered By: Blanche East on 12/15/2022 09:37:55 -------------------------------------------------------------------------------- Activities of Daily Living Details Patient Name: Date of Service: [name], [name] 12/15/2022 9:00 A M Medical Record Number: [id number] Patient Account Number: [id number] Date of Birth/Sex: Treating RN: 1975/12/02 (47 y.o. Marta Lamas Primary Care Lachlan Mckim: Juluis Mire Other Clinician: Referring Christain Niznik: Treating Sheddrick Lattanzio/Extender: Patrici Ranks in Treatment: 0 Activities of Daily Living Items Answer Activities of Daily Living (Please select one for each item) Drive Automobile Not Able T Medications ake Completely Able Use T elephone Completely Able Care for Appearance Completely Able Use T oilet Completely Able Bath / Shower Completely Able Dress Self Completely Able Feed Self Completely Able Walk Need Assistance Get In / Out Bed Completely Able Housework Completely Able Prepare Meals Completely [place] for Self Need Assistance Electronic Signature(s) Signed:  12/15/2022 4:08:25 PM By: Blanche East RN Entered By: Blanche East on 12/15/2022 09:38:50 -------------------------------------------------------------------------------- Education Screening Details Patient Name: Date of Service: Lori Hawking Chambers. 12/15/2022 9:00 A M Medical Record Number: [id number] Patient Account Number: [id number] Date of Birth/Sex: Treating RN: 1976-07-23 (47 y.o. Marta Lamas Primary Care Georjean Toya: Juluis Mire Other Clinician: Referring Erminie Foulks: Treating Kohan Azizi/Extender: Patrici Ranks in Treatment: 0 [name], [name] ([id number]) [id number] Page 2 of 4 Primary Learner Assessed: Patient Learning Preferences/Education Level/Primary Language Learning Preference: Explanation Highest Education Level: High School Preferred Language: English Cognitive Barrier Language Barrier: No Translator Needed: No Memory Deficit: No Emotional Barrier: No Cultural/Religious Beliefs Affecting Medical Care: No Physical Barrier Impaired Vision: No Impaired Hearing: No Decreased Hand dexterity: No Knowledge/Comprehension Knowledge Level: High Comprehension Level: High Ability to understand written instructions: High Ability to understand verbal instructions: High Motivation Anxiety Level: Calm Cooperation: Cooperative Education Importance: Acknowledges Need Interest in Health Problems: Asks Questions Perception: Coherent Willingness to Engage in Self-Management High Activities: Readiness to Engage in Self-Management High Activities: Electronic Signature(s) Signed: 12/15/2022 4:08:25 PM By: Blanche East RN Entered By: Blanche East on 12/15/2022 09:39:07 -------------------------------------------------------------------------------- Fall Risk Assessment Details Patient Name: Date of Service: Lori Hawking Chambers. 12/15/2022 9:00 A M Medical Record Number: [id number] Patient Account Number:  [id number] Date of Birth/Sex: Treating RN: 1975-09-28 (47 y.o. Iver Nestle, Jamie Primary Care Nathanial Arrighi: Juluis Mire Other Clinician: Referring Lashawnda Hancox: Treating Tykiera Raven/Extender: Patrici Ranks in Treatment: 0 Fall Risk Assessment Items Have you had 2 or more falls in the last 12 monthso 0 No Have you had any fall that resulted in injury in the last 12 monthso 0 No FALLS RISK SCREEN History of falling - immediate or within 3 months 0 No Secondary diagnosis (Do you have 2 or more medical diagnoseso) 0 No Ambulatory aid None/bed rest/wheelchair/nurse 0 Yes Crutches/cane/walker 0 No Furniture 0 No Intravenous therapy Access/Saline/Heparin  Lock 0 No Gait/Transferring Normal/ bed rest/ wheelchair 0 Yes Weak (short steps with or without shuffle, stooped but able to lift head while walking, may seek 0 No support from furniture) Impaired (short steps with shuffle, may have difficulty arising from chair, head down, impaired 0 No balance) Mental Status Oriented to own ability 0 Yes Overestimates or forgets limitations 0 No Risk Level: Low Risk Score: 0 [name], [name] ([id number]) [phone] [id number] Page 3 of 4 Electronic Signature(s) -------------------------------------------------------------------------------- Foot Assessment Details Patient Name: Date of Service: [name], [name] 12/15/2022 9:00 A M Medical Record Number: [id number] Patient Account Number: [id number] Date of Birth/Sex: Treating RN: 1976-05-27 (47 y.o. Marta Lamas Primary Care Shaine Newmark: Juluis Mire Other Clinician: Referring Daysy Santini: Treating Alexiz Cothran/Extender: Patrici Ranks in Treatment: 0 Foot Assessment Items Site Locations + = Sensation present, - = Sensation absent, C = Callus, U = Ulcer Chambers = Redness, W = Warmth, M = Maceration, PU = Pre-ulcerative lesion F = Fissure, S = Swelling, D =  Dryness Assessment Right: Left: Other Deformity: No No Prior Foot Ulcer: No No Prior Amputation: No No Charcot Joint: No No Ambulatory Status: Ambulatory Without Help Gait: Steady Electronic Signature(s) Signed: 12/15/2022 4:08:25 PM By: Blanche East RN Entered By: Blanche East on 12/15/2022 09:45:31 -------------------------------------------------------------------------------- Nutrition Risk Screening Details Patient Name: Date of Service: [name], [name] 12/15/2022 9:00 A M Medical Record Number: [id number] Patient Account Number: [id number] Date of Birth/Sex: Treating RN: 04-26-1976 (47 y.o. Marta Lamas Primary Care Tahjanae Blankenburg: Juluis Mire Other Clinician: Referring Burrell Hodapp: Treating Florabelle Cardin/Extender: Patrici Ranks in Treatment: 0 Height (in): 66 Weight (lbs): 150 Body Mass Index (BMI): 24.2 [name], [name] ([id number]) [id number] Page 4 of 4 Nutrition Risk Screening Items Score Screening NUTRITION RISK SCREEN: I have an illness or condition that made me change the kind and/or amount of food I eat 0 No I eat fewer than two meals per day 3 Yes I eat few fruits and vegetables, or milk products 2 Yes I have three or more drinks of beer, liquor or wine almost every day 0 No I have tooth or mouth problems that make it hard for me to eat 0 No I don't always have enough money to buy the food I need 0 No I eat alone most of the time 0 No I take three or more different prescribed or over-the-counter drugs a day 1 Yes Without wanting to, I have lost or gained 10 pounds in the last six months 2 Yes I am not always physically able to shop, cook and/or feed myself 0 No Nutrition Protocols Good Risk Protocol Moderate Risk Protocol High Risk Proctocol 0 Provide education on nutrition Risk Level: High Risk Score: 8 Electronic Signature(s) Signed: 12/15/2022 4:08:25 PM By: Blanche East RN Entered By:  Blanche East on 12/15/2022 09:39:59

## 2022-12-16 NOTE — Progress Notes (Addendum)
[name], [name] ([id number]) [id number] Page 1 of 7 Visit Report for 12/15/2022 Allergy List Details Patient Name: Date of Service: [name], [name] 12/15/2022 9:00 A M Medical Record Number: [id number] Patient Account Number: [id number] Date of Birth/Sex: Treating RN: 06-08-1976 (46 y.o. Female) Tommie Ard Primary Care [name]: Gwinda Passe Other Clinician: Referring Anysha Frappier: Treating Imanol Bihl/Extender: Enis Slipper Weeks in Treatment: 0 Allergies Active Allergies adhesive tape Allergy Notes Electronic Signature(s) Signed: 12/15/2022 4:08:25 PM By: Tommie Ard RN Entered By: Tommie Ard on 12/15/2022 09:37:39 -------------------------------------------------------------------------------- Arrival Information Details Patient Name: Date of Service: Lori Kennedy R. 12/15/2022 9:00 A M Medical Record Number: [id number] Patient Account Number: [id number] Date of Birth/Sex: Treating RN: [birth year] (46 y.o. Female) Primary Care Lucille Witts: Gwinda Passe Other Clinician: Referring Jeanae Whitmill: Treating Arianie Couse/Extender: Sharyl Nimrod in Treatment: 0 Visit Information Patient Arrived: Ambulatory Arrival Time: 09:32 Accompanied By: self Transfer Assistance: None Patient Identification Verified: Yes Secondary Verification Process Completed: Yes History Since Last Visit All ordered tests and consults were completed: No Added or deleted any medications: No Any new allergies or adverse reactions: No Had a fall or experienced change in activities of daily living that may affect risk of falls: No Signs or symptoms of abuse/neglect since last visito No Hospitalized since last visit: No Implantable device outside of the clinic excluding cellular tissue based products placed in the center since last visit: No Electronic Signature(s) Signed: 12/15/2022 10:34:37 AM By: Dayton Scrape Entered By: Dayton Scrape on 12/15/2022 09:32:24 -------------------------------------------------------------------------------- Clinic Level of Care Assessment Details Patient Name: Date of Service: [name], [name] 12/15/2022 9:00 A M Medical Record Number: [id number] Patient Account Number: [id number] Date of Birth/Sex: Treating RN: 10-Sep-1976 (46 y.o. Female) Tommie Ard Primary Care Iley Deignan: Gwinda Passe Other Clinician: Referring Verner Mccrone: Treating Alany Borman/Extender: Sharyl Nimrod in Treatment: 0 Clinic Level of Care Assessment Items TOOL 1 Quantity Score X- 1 0 Use when EandM and Procedure is performed on INITIAL visit XUXA, CIARLO ([id number]) [phone] Page 2 of 7 ASSESSMENTS - Nursing Assessment / Reassessment X- 1 20 General Physical Exam (combine w/ comprehensive assessment (listed just below) when performed on new pt. evals) X- 1 25 Comprehensive Assessment (HX, ROS, Risk Assessments, Wounds Hx, etc.) ASSESSMENTS - Wound and Skin Assessment / Reassessment []  - 0 Dermatologic / Skin Assessment (not related to wound area) ASSESSMENTS - Ostomy and/or Continence Assessment and Care []  - 0 Incontinence Assessment and Management []  - 0 Ostomy Care Assessment and Management (repouching, etc.) PROCESS - Coordination of Care X - Simple Patient / Family Education for ongoing care 1 15 []  - 0 Complex (extensive) Patient / Family Education for ongoing care X- 1 10 Staff obtains Chiropractor, Records, T Results / Process Orders est X- 1 10 Staff telephones HHA, Nursing Homes / Clarify orders / etc []  - 0 Routine Transfer to another Facility (non-emergent condition) []  - 0 Routine Hospital Admission (non-emergent condition) X- 1 15 New Admissions / Manufacturing engineer / Ordering NPWT Apligraf, etc. , []  - 0 Emergency Hospital Admission (emergent condition) PROCESS - Special Needs []  - 0 Pediatric / Minor Patient  Management []  - 0 Isolation Patient Management []  - 0 Hearing / Language / Visual special needs []  - 0 Assessment of Community assistance (transportation, D/C planning, etc.) []  - 0 Additional assistance / Altered mentation []  - 0 Support Surface(s) Assessment (bed, cushion, seat, etc.) INTERVENTIONS - Miscellaneous []  - 0 External ear exam []  - 0 Patient Transfer (multiple  staff / Michiel Sites Lift / Similar devices) []  - 0 Simple Staple / Suture removal (25 or less) []  - 0 Complex Staple / Suture removal (26 or more) []  - 0 Hypo/Hyperglycemic Management (do not check if billed separately) []  - 0 Ankle / Brachial Index (ABI) - do not check if billed separately Has the patient been seen at the hospital within the last three years: Yes Total Score: 95 Level Of Care: New/Established - Level 3 Electronic Signature(s) Signed: 12/15/2022 4:08:25 PM By: Tommie Ard RN Entered By: Tommie Ard on 12/15/2022 11:10:43 -------------------------------------------------------------------------------- Lower Extremity Assessment Details Patient Name: Date of Service: [name], [name] 12/15/2022 9:00 A M Medical Record Number: [id number] Patient Account Number: [id number] Date of Birth/Sex: Treating RN: 03-Jul-1976 (46 y.o. Female) Tommie Ard Primary Care Benicia Bergevin: Gwinda Passe Other Clinician: Referring River Mckercher: Treating Torey Regan/Extender: Sharyl Nimrod in Treatment: 0 Electronic Signature(s) Signed: 12/15/2022 4:08:25 PM By: Tommie Ard RN Entered By: Tommie Ard on 12/15/2022 09:45:37 Minta Balsam ([id number]) [id number] Page 3 of 7 -------------------------------------------------------------------------------- Multi Wound Chart Details Patient Name: Date of Service: [name], [name] 12/15/2022 9:00 A M Medical Record Number: [id number] Patient Account Number: [id number] Date of Birth/Sex: Treating  RN: 11/28/1975 (46 y.o. Female) Primary Care Ysabel Stankovich: Gwinda Passe Other Clinician: Referring Abdinasir Spadafore: Treating Kendra Woolford/Extender: Sharyl Nimrod in Treatment: 0 Vital Signs Height(in): 66 Pulse(bpm): 93 Weight(lbs): 150 Blood Pressure(mmHg): 95/60 Body Mass Index(BMI): 24.2 Temperature(F): 98.5 Respiratory Rate(breaths/min): 20 [2:Photos:] [N/A:N/A] Left Gluteal fold N/A N/A Wound Location: Shear/Friction N/A N/A Wounding Event: Trauma, Other N/A N/A Primary Etiology: Anemia, Lymphedema, Hypertension, N/A N/A Comorbid History: Hypotension, Cirrhosis 03/20/2022 N/A N/A Date Acquired: 37 N/A N/A Weeks of Treatment: Open N/A N/A Wound Status: No N/A N/A Wound Recurrence: 1.9x1.6x0.4 N/A N/A Measurements L x W x D (cm) 2.388 N/A N/A A (cm) : rea 0.955 N/A N/A Volume (cm) : 90.60% N/A N/A % Reduction in A rea: 97.10% N/A N/A % Reduction in Volume: Full Thickness Without Exposed N/A N/A Classification: Support Structures Medium N/A N/A Exudate A mount: Serosanguineous N/A N/A Exudate Type: red, brown N/A N/A Exudate Color: Large (67-100%) N/A N/A Granulation A mount: Pink N/A N/A Granulation Quality: Small (1-33%) N/A N/A Necrotic A mount: Fat Layer (Subcutaneous Tissue): Yes N/A N/A Exposed Structures: Fascia: No Tendon: No Muscle: No Joint: No Bone: No Small (1-33%) N/A N/A Epithelialization: Debridement - Selective/Open Wound N/A N/A Debridement: Pre-procedure Verification/Time Out 10:00 N/A N/A Taken: Lidocaine 4% Topical Solution N/A N/A Pain Control: Slough N/A N/A Tissue Debrided: Non-Viable Tissue N/A N/A Level: 3.04 N/A N/A Debridement A (sq cm): rea Curette N/A N/A Instrument: Minimum N/A N/A Bleeding: Pressure N/A N/A Hemostasis A chieved: Procedure was tolerated well N/A N/A Debridement Treatment Response: 1.9x1.6x0.1 N/A N/A Post Debridement Measurements L x W x D (cm) 0.239 N/A  N/A Post Debridement Volume: (cm) Scarring: Yes N/A N/A Periwound Skin Texture: Maceration: No N/A N/A Periwound Skin Moisture: Dry/Scaly: No No Abnormalities Noted N/A N/A Periwound Skin Color: No Abnormality N/A N/A Temperature: Debridement N/A N/A Procedures Performed: [name], [name] ([id number]) [phone].pdf Page 4 of 7 Treatment Notes Wound #2 (Gluteal fold) Wound Laterality: Left Cleanser Soap and Water Discharge Instruction: May shower and wash wound with dial antibacterial soap and water prior to dressing change. Wound Cleanser Discharge Instruction: Cleanse the wound with wound cleanser prior to applying a clean dressing using gauze sponges, not tissue or cotton balls. Peri-Wound Care Topical Primary Dressing Sorbalgon AG Dressing 6x6 (in/in)  Discharge Instruction: Apply to wound bed as instructed Secondary Dressing Zetuvit Plus Silicone Border Dressing 5x5 (in/in) Discharge Instruction: Apply silicone border over primary dressing as directed. Secured With Compression Wrap Compression Stockings Facilities manager) Signed: 12/15/2022 12:09:38 PM By: Duanne Guess MD FACS Entered By: Duanne Guess on 12/15/2022 12:09:38 -------------------------------------------------------------------------------- Multi-Disciplinary Care Plan Details Patient Name: Date of Service: Lori Kennedy R. 12/15/2022 9:00 A M Medical Record Number: [id number] Patient Account Number: [id number] Date of Birth/Sex: Treating RN: 1976-04-13 (46 y.o. Female) Tommie Ard Primary Care Sukari Grist: Gwinda Passe Other Clinician: Referring Mckennah Kretchmer: Treating Jaleia Hanke/Extender: Sharyl Nimrod in Treatment: 0 Active Inactive Electronic Signature(s) Signed: 01/16/2023 2:31:22 PM By: Shawn Stall RN, BSN Signed: 01/25/2023 2:05:42 PM By: Tommie Ard RN Previous Signature: 12/15/2022 4:08:25 PM Version By: Tommie Ard  RN Entered By: Shawn Stall on 01/16/2023 14:31:22 -------------------------------------------------------------------------------- Pain Assessment Details Patient Name: Date of Service: Lori Kennedy R. 12/15/2022 9:00 A M Medical Record Number: [id number] Patient Account Number: [id number] Date of Birth/Sex: Treating RN: 04-Oct-1975 (46 y.o. Female) Tommie Ard Primary Care Lafawn Lenoir: Gwinda Passe Other Clinician: Referring Atalaya Zappia: Treating Karam Dunson/Extender: Sharyl Nimrod in Treatment: 0 Active Problems Location of Pain Severity and Description of Pain Patient Has Paino No Site Locations Rate the pain. [name], Lori R ([id number]) [id number] Page 5 of 7 Rate the pain. Current Pain Level: 0 Pain Management and Medication Current Pain Management: Electronic Signature(s) Signed: 12/15/2022 4:08:25 PM By: Tommie Ard RN Entered By: Tommie Ard on 12/15/2022 09:46:34 -------------------------------------------------------------------------------- Patient/Caregiver Education Details Patient Name: Date of Service: [name] 3/22/2024andnbsp9:00 A M Medical Record Number: [id number] Patient Account Number: [id number] Date of Birth/Gender: Treating RN: 12-26-1975 (47 y.o. Female) Tommie Ard Primary Care Physician: Gwinda Passe Other Clinician: Referring Physician: Treating Physician/Extender: Sharyl Nimrod in Treatment: 0 Education Assessment Education Provided To: Patient Education Topics Provided Smoking and Wound Healing: Methods: Explain/Verbal Responses: Reinforcements needed, State content correctly Wound Debridement: Methods: Explain/Verbal Responses: Reinforcements needed, State content correctly Wound/Skin Impairment: Methods: Explain/Verbal Responses: Reinforcements needed, State content correctly Electronic Signature(s) Signed: 12/15/2022 4:08:25 PM  By: Tommie Ard RN Entered By: Tommie Ard on 12/15/2022 09:48:04 -------------------------------------------------------------------------------- Wound Assessment Details Patient Name: Date of Service: Lori Kennedy R. 12/15/2022 9:00 A M Medical Record Number: [id number] Patient Account Number: [id number] Date of Birth/Sex: Treating RN: 1975/11/13 (46 y.o. Female) Tommie Ard Primary Care Aleesha Ringstad: Gwinda Passe Other Clinician: Referring Zhane Bluitt: Treating Grizel Vesely/Extender: Sharyl Nimrod in Treatment: 95 [name], Lori R ([id number]) [id number] Page 6 of 7 Wound Status Wound Number: 2 Primary Etiology: Trauma, Other Wound Location: Left Gluteal fold Wound Status: Open Wounding Event: Shear/Friction Comorbid Anemia, Lymphedema, Hypertension, Hypotension, History: Cirrhosis Date Acquired: 03/20/2022 Weeks Of Treatment: 29 Clustered Wound: No Photos Wound Measurements Length: (cm) 1.9 Width: (cm) 1.6 Depth: (cm) 0.4 Area: (cm) 2.388 Volume: (cm) 0.955 % Reduction in Area: 90.6% % Reduction in Volume: 97.1% Epithelialization: Small (1-33%) Tunneling: No Undermining: No Wound Description Classification: Full Thickness Without Exposed Support Structures Exudate Amount: Medium Exudate Type: Serosanguineous Exudate Color: red, brown Foul Odor After Cleansing: No Slough/Fibrino Yes Wound Bed Granulation Amount: Large (67-100%) Exposed Structure Granulation Quality: Pink Fascia Exposed: No Necrotic Amount: Small (1-33%) Fat Layer (Subcutaneous Tissue) Exposed: Yes Tendon Exposed: No Muscle Exposed: No Joint Exposed: No Bone Exposed: No Periwound Skin Texture Texture Color No Abnormalities Noted: No No Abnormalities Noted: Yes Scarring: Yes Temperature / Pain Temperature: No Abnormality Moisture No Abnormalities Noted: No Dry /  Scaly: No Maceration: No Electronic Signature(s) Signed: 12/15/2022  4:08:25 PM By: Tommie Ard RN Entered By: Tommie Ard on 12/15/2022 09:45:50 -------------------------------------------------------------------------------- Vitals Details Patient Name: Date of Service: Lori Kennedy R. 12/15/2022 9:00 A M Medical Record Number: [id number] Patient Account Number: [id number] Date of Birth/Sex: Treating RN: Jun 07, 1976 (46 y.o. Female) Primary Care Zachariah Pavek: Gwinda Passe Other Clinician: Referring Terriah Reggio: Treating Milianna Ericsson/Extender: Sharyl Nimrod in Treatment: 0 Vital Signs Time Taken: 09:32 Temperature (F): 98.5 [name], Lori R ([id number]) [phone].pdf Page 7 of 7 Height (in): 66 Pulse (bpm): 93 Weight (lbs): 150 Respiratory Rate (breaths/min): 20 Body Mass Index (BMI): 24.2 Blood Pressure (mmHg): 95/60 Reference Range: 80 - 120 mg / dl Electronic Signature(s) Signed: 12/15/2022 10:34:37 AM By: Dayton Scrape Entered By: Dayton Scrape on 12/15/2022 09:33:15

## 2022-12-18 ENCOUNTER — Telehealth: Payer: Self-pay

## 2022-12-18 NOTE — Telephone Encounter (Signed)
PA request received via CMM for Pantoprazole Sodium 40MG  dr tablets  PA has been submitted to [facility] and is pending determination  Key: [id number]

## 2022-12-19 DIAGNOSIS — Z992 Dependence on renal dialysis: Secondary | ICD-10-CM | POA: Diagnosis not present

## 2022-12-19 DIAGNOSIS — Z01818 Encounter for other preprocedural examination: Secondary | ICD-10-CM | POA: Diagnosis not present

## 2022-12-19 DIAGNOSIS — T8249XA Other complication of vascular dialysis catheter, initial encounter: Secondary | ICD-10-CM | POA: Diagnosis not present

## 2022-12-19 DIAGNOSIS — N186 End stage renal disease: Secondary | ICD-10-CM | POA: Diagnosis not present

## 2022-12-19 DIAGNOSIS — K7031 Alcoholic cirrhosis of liver with ascites: Secondary | ICD-10-CM | POA: Diagnosis not present

## 2022-12-21 ENCOUNTER — Ambulatory Visit (HOSPITAL_COMMUNITY)
Admission: RE | Admit: 2022-12-21 | Discharge: 2022-12-21 | Disposition: A | Discharge status: Home / Self Care | Payer: BC Managed Care – PPO | Source: Ambulatory Visit | Attending: Nurse Practitioner | Admitting: Nurse Practitioner

## 2022-12-21 DIAGNOSIS — Z992 Dependence on renal dialysis: Secondary | ICD-10-CM | POA: Diagnosis not present

## 2022-12-21 DIAGNOSIS — R188 Other ascites: Secondary | ICD-10-CM | POA: Diagnosis not present

## 2022-12-21 DIAGNOSIS — T8249XA Other complication of vascular dialysis catheter, initial encounter: Secondary | ICD-10-CM | POA: Diagnosis not present

## 2022-12-21 DIAGNOSIS — K7460 Unspecified cirrhosis of liver: Secondary | ICD-10-CM | POA: Diagnosis not present

## 2022-12-21 DIAGNOSIS — N186 End stage renal disease: Secondary | ICD-10-CM | POA: Diagnosis not present

## 2022-12-21 HISTORY — PX: IR PARACENTESIS: IMG2679

## 2022-12-21 MED ORDER — LIDOCAINE HCL 1 % IJ SOLN
INTRAMUSCULAR | Status: AC
  Filled 2022-12-21: qty 20

## 2022-12-21 MED ORDER — ALBUMIN HUMAN 25 % IV SOLN
25.0000 g | Freq: Once | INTRAVENOUS | Status: DC
  Filled 2022-12-21: qty 100

## 2022-12-21 MED ORDER — ALBUMIN HUMAN 25 % IV SOLN
INTRAVENOUS | Status: AC
  Filled 2022-12-21: qty 100

## 2022-12-21 NOTE — Telephone Encounter (Signed)
Chart reviewed.  Ordering provider was Aline August, MD  Please send message to that provider.

## 2022-12-21 NOTE — Telephone Encounter (Signed)
PA has been DENIED, denial letter has been attached in patients documents.

## 2022-12-21 NOTE — Procedures (Signed)
PROCEDURE SUMMARY:  Successful US guided paracentesis from left lateral abdomen.  Yielded 4.1 liters of yellow fluid.  No immediate complications.  Pt tolerated well.   Specimen was not sent for labs.  EBL < 22mL  Docia Barrier PA-C 12/21/2022 3:23 PM

## 2022-12-23 DIAGNOSIS — N186 End stage renal disease: Secondary | ICD-10-CM | POA: Diagnosis not present

## 2022-12-23 DIAGNOSIS — T8249XA Other complication of vascular dialysis catheter, initial encounter: Secondary | ICD-10-CM | POA: Diagnosis not present

## 2022-12-23 DIAGNOSIS — Z992 Dependence on renal dialysis: Secondary | ICD-10-CM | POA: Diagnosis not present

## 2022-12-24 DIAGNOSIS — N179 Acute kidney failure, unspecified: Secondary | ICD-10-CM | POA: Diagnosis not present

## 2022-12-24 DIAGNOSIS — Z992 Dependence on renal dialysis: Secondary | ICD-10-CM | POA: Diagnosis not present

## 2022-12-24 DIAGNOSIS — N186 End stage renal disease: Secondary | ICD-10-CM | POA: Diagnosis not present

## 2022-12-26 ENCOUNTER — Telehealth: Payer: Self-pay | Admitting: Primary Care

## 2022-12-26 ENCOUNTER — Ambulatory Visit (INDEPENDENT_AMBULATORY_CARE_PROVIDER_SITE_OTHER): Payer: Self-pay

## 2022-12-26 ENCOUNTER — Ambulatory Visit (INDEPENDENT_AMBULATORY_CARE_PROVIDER_SITE_OTHER): Payer: BC Managed Care – PPO | Admitting: Primary Care

## 2022-12-26 DIAGNOSIS — K7031 Alcoholic cirrhosis of liver with ascites: Secondary | ICD-10-CM | POA: Diagnosis not present

## 2022-12-26 DIAGNOSIS — W19XXXA Unspecified fall, initial encounter: Secondary | ICD-10-CM | POA: Diagnosis not present

## 2022-12-26 DIAGNOSIS — M545 Low back pain, unspecified: Secondary | ICD-10-CM

## 2022-12-26 DIAGNOSIS — Z01818 Encounter for other preprocedural examination: Secondary | ICD-10-CM | POA: Diagnosis not present

## 2022-12-26 MED ORDER — LIDOCAINE 5 % EX PTCH: 1.0000 | MEDICATED_PATCH | CUTANEOUS | 0 refills | Status: DC

## 2022-12-26 NOTE — Telephone Encounter (Signed)
Copied from [place] [id number]. Topic: Appointment Scheduling - Scheduling Inquiry for Clinic >> Dec 26, 2022  3:33 PM [place] B wrote: Reason for CRM: patient called in says running late can be there in 10 or by 4, I let her know may have to reschedule but said really needs to see Dr Oletta Lamas

## 2022-12-26 NOTE — Telephone Encounter (Signed)
  Chief Complaint: lower back pain Symptoms: no pain when not moving  Frequency: Thursday Pertinent Negatives: Patient denies weakness Disposition: [] ED /[] Urgent Care (no appt availability in office) / [x] Appointment(In office/virtual)/ []  [facility]/ [] Home Care/ [] Refused Recommended Disposition /[] [place] [facility]/ []  Follow-up with PCP Additional Notes: PT states that she bent over to pick up a cup and her back started hurting.  Pt states there is no pain when not moving. Pt reports some difficulty moving right leg from pain.  Reason for Disposition  [1] SEVERE back pain (e.g., excruciating, unable to do any normal activities) AND [2] not improved 2 hours after pain medicine  Answer Assessment - Initial Assessment Questions 1. ONSET: "When did the pain begin?"      Thursday - dropped cup bent over and pain stated 2. LOCATION: "Where does it hurt?" (upper, mid or lower back)     Lower right 3. SEVERITY: "How bad is the pain?"  (e.g., Scale 1-10; mild, moderate, or severe)   - MILD (1-3): Doesn't interfere with normal activities.    - MODERATE (4-7): Interferes with normal activities or awakens from sleep.    - SEVERE (8-10): Excruciating pain, unable to do any normal activities.      9/10 4. PATTERN: "Is the pain constant?" (e.g., yes, no; constant, intermittent)      Comes and goes 5. RADIATION: "Does the pain shoot into your legs or somewhere else?"     no 6. CAUSE:  "What do you think is causing the back pain?"      Bent over 7. BACK OVERUSE:  "Any recent lifting of heavy objects, strenuous work or exercise?"     Bent over 8. MEDICINES: "What have you taken so far for the pain?" (e.g., nothing, acetaminophen, NSAIDS)     Tylenol - no relief 9. NEUROLOGIC SYMPTOMS: "Do you have any weakness, numbness, or problems with bowel/bladder control?"     Weakness in that leg 10. OTHER SYMPTOMS: "Do you have any other symptoms?" (e.g., fever, abdomen pain, burning with  urination, blood in urine)       Cold chills  Protocols used: Back Pain-A-AH

## 2022-12-26 NOTE — Progress Notes (Signed)
[place]  Virtual Visit  I connected with [name], on 12/26/2022 at 4:52 PM through an audio and video application  verified that I am speaking with the correct person using two identifiers.   Consent: I discussed the limitations, risks, security and privacy concerns of performing an evaluation and management service by telephone and the availability of in person appointments. I also discussed with the patient that there may be a patient responsible charge related to this service. The patient expressed understanding and agreed to proceed.   Location of Patient: car  Location of Provider: [facility] participating in Telemedicine visit: Aniyha R Kathlynn Grate,  NP   History of Present Illness: [name] is a 47 year old female who is being seen s/p fall last 12/13/22- she did not go to dialysis today due to not feeling well and low back pain we discussed what missing dialysis does and consequences. Limited on what can be prescribed with CKD and liver damage .   Past Medical History:  Diagnosis Date   Acid reflux    Alcohol dependence in remission (HCC)    Anemia    Renal disorder    Ulcer    Allergies  Allergen Reactions   Tape Rash    Clear tape    Current Outpatient Medications on File Prior to Visit  Medication Sig Dispense Refill   ferrous sulfate 325 (65 FE) MG tablet Take 325 mg by mouth every morning.     folic acid (FOLVITE) 1 MG tablet Take 1 tablet (1 mg total) by mouth daily. 30 tablet 6   lactulose (CHRONULAC) 10 GM/15ML solution Take 15 mLs (10 g total) by mouth daily. 237 mL 1   melatonin 3 MG TABS tablet Take by mouth.     OLANZapine (ZYPREXA) 2.5 MG tablet Take by mouth.     omeprazole (PRILOSEC) 20 MG capsule Take by mouth.     pantoprazole (PROTONIX) 40 MG tablet Take 1 tablet (40 mg total) by mouth 2 (two) times daily.     zinc sulfate 220 (50  Zn) MG capsule Take by mouth. (Patient not taking: Reported on 11/20/2022)     No current facility-administered medications on file prior to visit.    Observations/Objective: Crying low back decrease ability to walk  Assessment and Plan: Diagnoses and all orders for this visit:  Diagnoses and all orders for this visit:   Fall, initial encounter 2/2  Acute bilateral low back pain, unspecified whether sciatica present    lidocaine (LIDODERM) 5 %; Place 1 patch onto the skin daily. Remove & Discard patch within 12 hours or as directed by MD       Follow Up Instructions: I discussed the assessment and treatment plan with the patient. The patient was provided an opportunity to ask questions and all were answered. The patient agreed with the plan and demonstrated an understanding of the instructions.   The patient was advised to call back or seek an in-person evaluation if the symptoms worsen or if the condition fails to improve as anticipated.     I provided 15 minutes total of non-face-to-face time during this encounter including median intraservice time, reviewing previous notes, investigations, ordering medications, medical decision making, coordinating care and patient verbalized understanding at the end of the visit.    This note has been created with Surveyor, quantity. Any transcriptional errors are unintentional.  Kerin Perna, NP 12/26/2022, 4:52 PM

## 2022-12-26 NOTE — Telephone Encounter (Signed)
Provider has spoken with pt  °

## 2022-12-27 ENCOUNTER — Other Ambulatory Visit (HOSPITAL_COMMUNITY): Payer: Self-pay | Admitting: Nurse Practitioner

## 2022-12-27 DIAGNOSIS — N186 End stage renal disease: Secondary | ICD-10-CM | POA: Diagnosis not present

## 2022-12-27 DIAGNOSIS — Z992 Dependence on renal dialysis: Secondary | ICD-10-CM | POA: Diagnosis not present

## 2022-12-27 DIAGNOSIS — R188 Other ascites: Secondary | ICD-10-CM

## 2022-12-27 DIAGNOSIS — T8249XA Other complication of vascular dialysis catheter, initial encounter: Secondary | ICD-10-CM | POA: Diagnosis not present

## 2022-12-28 DIAGNOSIS — Z992 Dependence on renal dialysis: Secondary | ICD-10-CM | POA: Diagnosis not present

## 2022-12-28 DIAGNOSIS — T8249XA Other complication of vascular dialysis catheter, initial encounter: Secondary | ICD-10-CM | POA: Diagnosis not present

## 2022-12-28 DIAGNOSIS — N186 End stage renal disease: Secondary | ICD-10-CM | POA: Diagnosis not present

## 2022-12-29 ENCOUNTER — Ambulatory Visit (HOSPITAL_COMMUNITY)
Admission: RE | Admit: 2022-12-29 | Discharge: 2022-12-29 | Disposition: A | Discharge status: Home / Self Care | Payer: BC Managed Care – PPO | Source: Ambulatory Visit | Attending: Nurse Practitioner | Admitting: Nurse Practitioner

## 2022-12-29 ENCOUNTER — Ambulatory Visit (HOSPITAL_BASED_OUTPATIENT_CLINIC_OR_DEPARTMENT_OTHER): Payer: BC Managed Care – PPO | Admitting: General Surgery

## 2022-12-29 ENCOUNTER — Telehealth (INDEPENDENT_AMBULATORY_CARE_PROVIDER_SITE_OTHER): Payer: Self-pay | Admitting: Primary Care

## 2022-12-29 DIAGNOSIS — K7460 Unspecified cirrhosis of liver: Secondary | ICD-10-CM | POA: Diagnosis not present

## 2022-12-29 DIAGNOSIS — R188 Other ascites: Secondary | ICD-10-CM | POA: Diagnosis not present

## 2022-12-29 DIAGNOSIS — N186 End stage renal disease: Secondary | ICD-10-CM | POA: Diagnosis not present

## 2022-12-29 HISTORY — PX: IR PARACENTESIS: IMG2679

## 2022-12-29 MED ORDER — ALBUMIN HUMAN 25 % IV SOLN
12.5000 g | Freq: Once | INTRAVENOUS | Status: AC
  Administered 2022-12-29: 12.5 g via INTRAVENOUS

## 2022-12-29 MED ORDER — LIDOCAINE HCL 1 % IJ SOLN
INTRAMUSCULAR | Status: AC
  Filled 2022-12-29: qty 20

## 2022-12-29 MED ORDER — ALBUMIN HUMAN 25 % IV SOLN
INTRAVENOUS | Status: AC
  Filled 2022-12-29: qty 50

## 2022-12-29 NOTE — Telephone Encounter (Signed)
The medication for lidocaine requires a prior authorization through her insurance before she can pick it up. Please assist patient further as soon as possible. The patient is going to contact the pharmacy as well to see if there is anything they can do.

## 2022-12-29 NOTE — Procedures (Addendum)
PROCEDURE SUMMARY:  Successful US guided paracentesis from left lateral abdomen.  Yielded 3.8 of yellow fluid.  No immediate complications.  Pt tolerated well.   Specimen was not sent for labs.  EBL < 35mL   The patient has previously been formally evaluated by the [facility] and is being actively followed for potential future intervention.   Most recent evaluation or update: Patient still undergoing work-up for potential liver transplant.  She has an appt with her Liver Care team 4/9.   Hoyt Koch PA-C 12/29/2022 2:09 PM

## 2022-12-30 DIAGNOSIS — T8249XA Other complication of vascular dialysis catheter, initial encounter: Secondary | ICD-10-CM | POA: Diagnosis not present

## 2022-12-30 DIAGNOSIS — Z992 Dependence on renal dialysis: Secondary | ICD-10-CM | POA: Diagnosis not present

## 2022-12-30 DIAGNOSIS — N186 End stage renal disease: Secondary | ICD-10-CM | POA: Diagnosis not present

## 2023-01-01 ENCOUNTER — Other Ambulatory Visit: Payer: Self-pay

## 2023-01-02 DIAGNOSIS — K7031 Alcoholic cirrhosis of liver with ascites: Secondary | ICD-10-CM | POA: Diagnosis not present

## 2023-01-02 DIAGNOSIS — D696 Thrombocytopenia, unspecified: Secondary | ICD-10-CM | POA: Diagnosis not present

## 2023-01-02 DIAGNOSIS — K7682 Hepatic encephalopathy: Secondary | ICD-10-CM | POA: Diagnosis not present

## 2023-01-02 DIAGNOSIS — K7460 Unspecified cirrhosis of liver: Secondary | ICD-10-CM | POA: Diagnosis not present

## 2023-01-02 DIAGNOSIS — R6521 Severe sepsis with septic shock: Secondary | ICD-10-CM | POA: Diagnosis not present

## 2023-01-02 DIAGNOSIS — J9811 Atelectasis: Secondary | ICD-10-CM | POA: Diagnosis not present

## 2023-01-02 DIAGNOSIS — A419 Sepsis, unspecified organism: Secondary | ICD-10-CM | POA: Diagnosis not present

## 2023-01-02 DIAGNOSIS — K529 Noninfective gastroenteritis and colitis, unspecified: Secondary | ICD-10-CM | POA: Diagnosis not present

## 2023-01-02 DIAGNOSIS — I120 Hypertensive chronic kidney disease with stage 5 chronic kidney disease or end stage renal disease: Secondary | ICD-10-CM | POA: Diagnosis not present

## 2023-01-02 DIAGNOSIS — Z992 Dependence on renal dialysis: Secondary | ICD-10-CM | POA: Diagnosis not present

## 2023-01-02 DIAGNOSIS — D631 Anemia in chronic kidney disease: Secondary | ICD-10-CM | POA: Diagnosis not present

## 2023-01-02 DIAGNOSIS — R601 Generalized edema: Secondary | ICD-10-CM | POA: Diagnosis not present

## 2023-01-02 DIAGNOSIS — Z7682 Awaiting organ transplant status: Secondary | ICD-10-CM | POA: Diagnosis not present

## 2023-01-02 DIAGNOSIS — T8249XA Other complication of vascular dialysis catheter, initial encounter: Secondary | ICD-10-CM | POA: Diagnosis not present

## 2023-01-02 DIAGNOSIS — A414 Sepsis due to anaerobes: Secondary | ICD-10-CM | POA: Diagnosis not present

## 2023-01-02 DIAGNOSIS — Z452 Encounter for adjustment and management of vascular access device: Secondary | ICD-10-CM | POA: Diagnosis not present

## 2023-01-02 DIAGNOSIS — J90 Pleural effusion, not elsewhere classified: Secondary | ICD-10-CM | POA: Diagnosis not present

## 2023-01-02 DIAGNOSIS — N186 End stage renal disease: Secondary | ICD-10-CM | POA: Diagnosis not present

## 2023-01-02 DIAGNOSIS — I953 Hypotension of hemodialysis: Secondary | ICD-10-CM | POA: Diagnosis not present

## 2023-01-02 DIAGNOSIS — R188 Other ascites: Secondary | ICD-10-CM | POA: Diagnosis not present

## 2023-01-02 DIAGNOSIS — D649 Anemia, unspecified: Secondary | ICD-10-CM | POA: Diagnosis not present

## 2023-01-02 DIAGNOSIS — E440 Moderate protein-calorie malnutrition: Secondary | ICD-10-CM | POA: Diagnosis not present

## 2023-01-02 DIAGNOSIS — I82220 Acute embolism and thrombosis of inferior vena cava: Secondary | ICD-10-CM | POA: Diagnosis not present

## 2023-01-02 DIAGNOSIS — M5441 Lumbago with sciatica, right side: Secondary | ICD-10-CM | POA: Diagnosis not present

## 2023-01-02 DIAGNOSIS — T80211A Bloodstream infection due to central venous catheter, initial encounter: Secondary | ICD-10-CM | POA: Diagnosis not present

## 2023-01-02 DIAGNOSIS — R7881 Bacteremia: Secondary | ICD-10-CM | POA: Diagnosis not present

## 2023-01-02 DIAGNOSIS — K311 Adult hypertrophic pyloric stenosis: Secondary | ICD-10-CM | POA: Diagnosis not present

## 2023-01-02 DIAGNOSIS — D638 Anemia in other chronic diseases classified elsewhere: Secondary | ICD-10-CM | POA: Diagnosis not present

## 2023-01-02 DIAGNOSIS — I959 Hypotension, unspecified: Secondary | ICD-10-CM | POA: Diagnosis not present

## 2023-01-02 DIAGNOSIS — K6389 Other specified diseases of intestine: Secondary | ICD-10-CM | POA: Diagnosis not present

## 2023-01-02 DIAGNOSIS — E872 Acidosis, unspecified: Secondary | ICD-10-CM | POA: Diagnosis not present

## 2023-01-02 DIAGNOSIS — K922 Gastrointestinal hemorrhage, unspecified: Secondary | ICD-10-CM | POA: Diagnosis not present

## 2023-01-02 DIAGNOSIS — K766 Portal hypertension: Secondary | ICD-10-CM | POA: Diagnosis not present

## 2023-01-02 DIAGNOSIS — Z4682 Encounter for fitting and adjustment of non-vascular catheter: Secondary | ICD-10-CM | POA: Diagnosis not present

## 2023-01-02 DIAGNOSIS — K7210 Chronic hepatic failure without coma: Secondary | ICD-10-CM | POA: Diagnosis not present

## 2023-01-02 DIAGNOSIS — I8510 Secondary esophageal varices without bleeding: Secondary | ICD-10-CM | POA: Diagnosis not present

## 2023-01-03 DIAGNOSIS — J9811 Atelectasis: Secondary | ICD-10-CM | POA: Diagnosis not present

## 2023-01-03 DIAGNOSIS — K7460 Unspecified cirrhosis of liver: Secondary | ICD-10-CM | POA: Diagnosis not present

## 2023-01-04 ENCOUNTER — Ambulatory Visit (HOSPITAL_BASED_OUTPATIENT_CLINIC_OR_DEPARTMENT_OTHER): Payer: BC Managed Care – PPO | Admitting: General Surgery

## 2023-01-04 ENCOUNTER — Telehealth: Payer: Self-pay | Admitting: Pharmacy Technician

## 2023-01-04 DIAGNOSIS — Z452 Encounter for adjustment and management of vascular access device: Secondary | ICD-10-CM | POA: Diagnosis not present

## 2023-01-04 DIAGNOSIS — R7881 Bacteremia: Secondary | ICD-10-CM | POA: Diagnosis not present

## 2023-01-04 NOTE — Telephone Encounter (Signed)
Patient Advocate Encounter  Received notification from [facility] that prior authorization for PANTOPRAZOLE 40MG  is required.   PA submitted on 4.11.24 Key [id number] Status is pending

## 2023-01-04 NOTE — Telephone Encounter (Signed)
PA has been APPROVED. Approval letter has been attached in patients documents. 

## 2023-01-05 DIAGNOSIS — J90 Pleural effusion, not elsewhere classified: Secondary | ICD-10-CM | POA: Diagnosis not present

## 2023-01-05 DIAGNOSIS — A419 Sepsis, unspecified organism: Secondary | ICD-10-CM | POA: Diagnosis not present

## 2023-01-05 DIAGNOSIS — R188 Other ascites: Secondary | ICD-10-CM | POA: Diagnosis not present

## 2023-01-05 DIAGNOSIS — J9811 Atelectasis: Secondary | ICD-10-CM | POA: Diagnosis not present

## 2023-01-06 ENCOUNTER — Other Ambulatory Visit (INDEPENDENT_AMBULATORY_CARE_PROVIDER_SITE_OTHER): Payer: Self-pay | Admitting: Primary Care

## 2023-01-06 DIAGNOSIS — K6389 Other specified diseases of intestine: Secondary | ICD-10-CM | POA: Diagnosis not present

## 2023-01-06 DIAGNOSIS — K529 Noninfective gastroenteritis and colitis, unspecified: Secondary | ICD-10-CM | POA: Diagnosis not present

## 2023-01-06 DIAGNOSIS — R601 Generalized edema: Secondary | ICD-10-CM | POA: Diagnosis not present

## 2023-01-08 DIAGNOSIS — Z4682 Encounter for fitting and adjustment of non-vascular catheter: Secondary | ICD-10-CM | POA: Diagnosis not present

## 2023-01-08 DIAGNOSIS — Z992 Dependence on renal dialysis: Secondary | ICD-10-CM | POA: Diagnosis not present

## 2023-01-08 DIAGNOSIS — Z452 Encounter for adjustment and management of vascular access device: Secondary | ICD-10-CM | POA: Diagnosis not present

## 2023-01-09 NOTE — Telephone Encounter (Signed)
Will forward to provider  

## 2023-01-10 NOTE — Telephone Encounter (Signed)
Contacted pt to go over provider response someone answered the phone and stated pt passed away on Feb 22, 2023. Looked in pt chart pt passed aware 01-Feb-2023. Chart has been made mark

## 2023-01-12 ENCOUNTER — Ambulatory Visit: Payer: Self-pay | Admitting: Hematology and Oncology

## 2023-01-12 ENCOUNTER — Other Ambulatory Visit: Payer: Self-pay

## 2023-01-24 NOTE — Telephone Encounter (Signed)
Requested medication (s) are due for refill today: yes  Requested medication (s) are on the active medication list: yes  Last refill:  11/20/22  Future visit scheduled: yes  Notes to clinic:  Unable to refill per protocol, last refill by another provider. Historical provider, routing for review     Requested Prescriptions  Pending Prescriptions Disp Refills   ferrous sulfate 325 (65 FE) MG tablet [Pharmacy Med Name: FERROUS SULFATE 325 MG TABLET] 90 tablet 1    Sig: TAKE 1 TABLET BY MOUTH EVERY DAY WITH BREAKFAST     Endocrinology:  Minerals - Iron Supplementation Failed - 01/06/2023 12:18 AM      Failed - HGB in normal range and within 360 days    Hemoglobin  Date Value Ref Range Status  10/24/2022 9.0 (L) 11.1 - 15.9 g/dL Final         Failed - HCT in normal range and within 360 days    Hematocrit  Date Value Ref Range Status  10/24/2022 23.7 (L) 34.0 - 46.6 % Final         Failed - RBC in normal range and within 360 days    RBC  Date Value Ref Range Status  10/24/2022 2.69 (LL) 3.77 - 5.28 x10E6/uL Final    Comment:    Target cells present. The RBC, HCT, and red cell indices may be inaccurate due to RBC agglutination. CBC results reported were obtained after the specimen had been warmed to 37 degrees C.  This may indicate the presence of Cold Agglutinins.   10/10/2022 2.01 (L) 3.87 - 5.11 MIL/uL Final         Failed - Ferritin in normal range and within 360 days    Ferritin  Date Value Ref Range Status  09/15/2022 709 (H) 11 - 307 ng/mL Final    Comment:    Performed at [facility], [address], [place], [place] [address]         Passed - Fe (serum) in normal range and within 360 days    Iron  Date Value Ref Range Status  09/15/2022 61 28 - 170 ug/dL Final   Saturation Ratios  Date Value Ref Range Status  09/15/2022 NOT CALCULATED 10.4 - 31.8 % Final         Passed - Valid encounter within last 12 months    Recent Outpatient Visits            1 week ago Fall, initial encounter   [place] [facility] [name], NP   3 weeks ago Hospital discharge follow-up   [place] [facility] [name], NP   2 months ago Hospital discharge follow-up   [place] [facility] [name], NP   7 months ago Bilateral edema of lower extremity   [place] [facility] [name], NP   7 months ago Bilateral edema of lower extremity   [place] [facility] [name], NP

## 2023-01-24 DEATH — deceased

## 2023-02-11 ENCOUNTER — Other Ambulatory Visit (INDEPENDENT_AMBULATORY_CARE_PROVIDER_SITE_OTHER): Payer: Self-pay | Admitting: Primary Care

## 2023-02-11 DIAGNOSIS — R600 Localized edema: Secondary | ICD-10-CM
